# Patient Record
Sex: Female | Born: 1945 | Race: White | Hispanic: No | Smoking: Never smoker
Health system: Western US, Academic
[De-identification: ages and names within clinical notes are randomized; demographics above are authoritative.]

## PROBLEM LIST (undated history)

## (undated) ENCOUNTER — Ambulatory Visit: Admission: RE | Payer: Medicare Other | Admitting: General Surgery

## (undated) ENCOUNTER — Encounter (HOSPITAL_COMMUNITY): Admission: RE | Payer: Self-pay

## (undated) ENCOUNTER — Inpatient Hospital Stay (HOSPITAL_COMMUNITY): Payer: Self-pay | Admitting: Surgery

## (undated) ENCOUNTER — Inpatient Hospital Stay: Payer: Medicare Other | Admitting: Surgery

## (undated) ENCOUNTER — Encounter (HOSPITAL_COMMUNITY): Payer: Self-pay

## (undated) DIAGNOSIS — A310 Pulmonary mycobacterial infection: Secondary | ICD-10-CM

## (undated) DIAGNOSIS — F32A Depression, unspecified: Secondary | ICD-10-CM

## (undated) DIAGNOSIS — E119 Type 2 diabetes mellitus without complications: Secondary | ICD-10-CM

## (undated) DIAGNOSIS — J449 Chronic obstructive pulmonary disease, unspecified: Secondary | ICD-10-CM

## (undated) HISTORY — DX: Pulmonary mycobacterial infection (CMS-HCC): A31.0

## (undated) HISTORY — DX: Type 2 diabetes mellitus without complications: E11.9

## (undated) HISTORY — PX: COLOSTOMY: SHX63

## (undated) HISTORY — DX: Depression, unspecified: F32.A

## (undated) HISTORY — DX: Chronic obstructive pulmonary disease, unspecified (CMS-HCC): J44.9

## (undated) HISTORY — DX: Chronic obstructive pulmonary disease, unspecified: J44.9

## (undated) HISTORY — PX: LUNG LOBECTOMY: SHX167

## (undated) SURGERY — COMPLEX ILEOSTOMY, TAKE-DOWN
Anesthesia: General | Site: Abdomen

## (undated) SURGERY — COLOSTOMY TAKEDOWN WITH COLORECTAL ANASTOMOSIS
Anesthesia: General | Site: Abdomen

## (undated) MED ORDER — EPINEPHRINE 0.3 MG/0.3ML IJ SOAJ
0.3000 mg | INTRAMUSCULAR | Status: AC | PRN
Start: 2023-02-09 — End: 2023-02-03

## (undated) MED ORDER — ACETAMINOPHEN 325 MG PO TABS
650.00 mg | ORAL_TABLET | Freq: Once | ORAL | Status: AC
Start: 2021-09-24 — End: 2021-09-25

## (undated) MED ORDER — SODIUM CHLORIDE 0.9 % IV SOLN
INTRAVENOUS | Status: AC | PRN
Start: 2021-06-05 — End: ?

## (undated) MED ORDER — FAMOTIDINE (PF) 20 MG/2ML IV SOLN
20.0000 mg | Freq: Once | INTRAVENOUS | Status: AC | PRN
Start: 2021-04-10 — End: 2021-03-28

## (undated) MED ORDER — IMMUNE GLOBULIN (HUMAN) 20 GM/200ML IJ SOLN
0.5000 g/kg | Freq: Once | INTRAMUSCULAR | Status: AC
Start: 2022-11-12 — End: 2022-11-12

## (undated) MED ORDER — DIPHENHYDRAMINE HCL 25 MG OR TABS OR CAPS CUSTOM
25.0000 mg | ORAL_CAPSULE | Freq: Once | ORAL | Status: AC | PRN
Start: 2021-04-10 — End: ?

## (undated) MED ORDER — DIPHENHYDRAMINE HCL 50 MG/ML IJ SOLN
25.00 mg | Freq: Once | INTRAMUSCULAR | Status: AC | PRN
Start: 2022-12-02 — End: 2022-12-03

## (undated) MED ORDER — ACETAMINOPHEN 325 MG PO TABS
650.00 mg | ORAL_TABLET | Freq: Once | ORAL | Status: AC
Start: 2022-05-27 — End: 2022-05-06

## (undated) MED ORDER — FAMOTIDINE (PF) 20 MG/2ML IV SOLN
20.00 mg | Freq: Once | INTRAVENOUS | Status: AC | PRN
Start: 2022-06-25 — End: 2022-06-25

## (undated) MED ORDER — DEXTROSE 5 % IV SOLN
INTRAVENOUS | Status: AC
Start: 2021-07-30 — End: 2021-08-01

## (undated) MED ORDER — DIPHENHYDRAMINE HCL 25 MG OR TABS OR CAPS CUSTOM
25.00 mg | ORAL_CAPSULE | Freq: Once | ORAL | Status: AC | PRN
Start: 2022-03-11 — End: ?

## (undated) MED ORDER — CETIRIZINE HCL 10 MG OR TABS
10.00 mg | ORAL_TABLET | Freq: Once | ORAL | Status: AC
Start: 2022-01-15 — End: 2022-01-14

## (undated) MED ORDER — ALBUTEROL SULFATE (5 MG/ML) 0.5% IN NEBU
2.50 mg | INHALATION_SOLUTION | Freq: Once | RESPIRATORY_TRACT | Status: AC | PRN
Start: 2022-09-16 — End: 2022-09-17

## (undated) MED ORDER — FAMOTIDINE (PF) 20 MG/2ML IV SOLN
20.0000 mg | Freq: Once | INTRAVENOUS | Status: AC | PRN
Start: 2021-10-22 — End: 2021-10-22

## (undated) MED ORDER — DIPHENHYDRAMINE HCL 25 MG OR TABS OR CAPS CUSTOM
25.0000 mg | ORAL_CAPSULE | Freq: Once | ORAL | Status: AC | PRN
Start: 2021-02-13 — End: ?

## (undated) MED ORDER — DIPHENHYDRAMINE HCL 25 MG OR TABS OR CAPS CUSTOM
25.00 mg | ORAL_CAPSULE | Freq: Once | ORAL | Status: AC | PRN
Start: 2022-07-23 — End: ?

## (undated) MED ORDER — CETIRIZINE HCL 10 MG OR TABS
10.00 mg | ORAL_TABLET | Freq: Once | ORAL | Status: AC
Start: 2021-07-02 — End: 2021-07-03

## (undated) MED ORDER — SODIUM CHLORIDE 0.9 % IV SOLN
INTRAVENOUS | Status: AC | PRN
Start: 2021-12-17 — End: ?

## (undated) MED ORDER — IMMUNE GLOBULIN (HUMAN) 20 GM/200ML IJ SOLN
0.50 g/kg | Freq: Once | INTRAMUSCULAR | Status: AC
Start: 2022-04-29 — End: 2022-04-08

## (undated) MED ORDER — IMMUNE GLOBULIN (HUMAN) 20 GM/200ML IJ SOLN
0.50 g/kg | Freq: Once | INTRAMUSCULAR | Status: AC
Start: 2022-08-19 — End: 2022-08-20

## (undated) MED ORDER — IMMUNE GLOBULIN (HUMAN) 20 GM/200ML IJ SOLN
0.50 g/kg | Freq: Once | INTRAMUSCULAR | Status: AC
Start: 2022-03-11 — End: 2022-03-11

## (undated) MED ORDER — HEPARIN SODIUM (PORCINE) 5000 UNIT/ML IJ SOLN
5000.00 [IU] | Freq: Once | INTRAMUSCULAR | Status: AC
Start: 2021-06-26 — End: 2021-06-26

## (undated) MED ORDER — HYDROCORTISONE SOD SUCCINATE 100 MG IJ SOLR (CUSTOM)
100.00 mg | Freq: Once | INTRAMUSCULAR | Status: AC | PRN
Start: 2021-08-27 — End: 2021-08-28

## (undated) MED ORDER — ACETAMINOPHEN 325 MG PO TABS
650.00 mg | ORAL_TABLET | Freq: Once | ORAL | Status: AC
Start: 2022-01-15 — End: 2022-01-14

## (undated) MED ORDER — HYDROCORTISONE SOD SUCCINATE 100 MG IJ SOLR (CUSTOM)
100.0000 mg | Freq: Once | INTRAMUSCULAR | Status: AC | PRN
Start: 2021-10-22 — End: 2021-10-22

## (undated) MED ORDER — ALBUTEROL SULFATE (2.5 MG/3ML) 0.083% IN NEBU
2.5000 mg | INHALATION_SOLUTION | RESPIRATORY_TRACT | 11 refills | Status: AC | PRN
Start: 2020-07-24 — End: ?

## (undated) MED ORDER — CETIRIZINE HCL 10 MG OR TABS
10.00 mg | ORAL_TABLET | Freq: Once | ORAL | Status: AC
Start: 2022-08-19 — End: 2022-08-20

## (undated) MED ORDER — EPINEPHRINE 0.3 MG/0.3ML IJ SOAJ
0.30 mg | INTRAMUSCULAR | Status: AC | PRN
Start: 2022-09-16 — End: 2022-09-17

## (undated) MED ORDER — EPINEPHRINE 0.3 MG/0.3ML IJ SOAJ
0.30 mg | INTRAMUSCULAR | Status: AC | PRN
Start: 2021-07-30 — End: 2021-07-31

## (undated) MED ORDER — EPINEPHRINE 0.3 MG/0.3ML IJ SOAJ
0.30 mg | INTRAMUSCULAR | Status: AC | PRN
Start: 2021-10-22 — End: 2021-10-23

## (undated) MED ORDER — ALBUTEROL SULFATE (5 MG/ML) 0.5% IN NEBU
2.5000 mg | INHALATION_SOLUTION | Freq: Once | RESPIRATORY_TRACT | Status: AC | PRN
Start: 2021-09-24 — End: 2021-09-24

## (undated) MED ORDER — HYDROCORTISONE SOD SUCCINATE 100 MG IJ SOLR (CUSTOM)
100.0000 mg | Freq: Once | INTRAMUSCULAR | Status: AC | PRN
Start: 2022-11-12 — End: 2022-11-12

## (undated) MED ORDER — ALBUTEROL SULFATE (5 MG/ML) 0.5% IN NEBU
2.50 mg | INHALATION_SOLUTION | Freq: Once | RESPIRATORY_TRACT | Status: AC | PRN
Start: 2022-05-27 — End: 2022-05-06

## (undated) MED ORDER — SODIUM CHLORIDE 0.9 % IV SOLN
INTRAVENOUS | Status: AC | PRN
Start: 2022-01-15 — End: ?

## (undated) MED ORDER — DEXTROSE 5 % IV SOLN
INTRAVENOUS | Status: AC
Start: 2021-03-13 — End: 2021-02-28

## (undated) MED ORDER — FAMOTIDINE (PF) 20 MG/2ML IV SOLN
20.0000 mg | Freq: Once | INTRAVENOUS | Status: AC | PRN
Start: 2021-09-24 — End: 2021-09-24

## (undated) MED ORDER — CETIRIZINE HCL 10 MG OR TABS
10.0000 mg | ORAL_TABLET | Freq: Once | ORAL | Status: AC
Start: 2021-11-19 — End: 2021-11-19

## (undated) MED ORDER — HYDROCORTISONE SOD SUCCINATE 100 MG IJ SOLR (CUSTOM)
100.00 mg | Freq: Once | INTRAMUSCULAR | Status: AC | PRN
Start: 2021-11-19 — End: 2021-11-20

## (undated) MED ORDER — SODIUM CHLORIDE 0.9 % IV SOLN
INTRAVENOUS | Status: AC | PRN
Start: 2022-12-10 — End: ?

## (undated) MED ORDER — CETIRIZINE HCL 10 MG OR TABS
10.00 mg | ORAL_TABLET | Freq: Once | ORAL | Status: AC
Start: 2022-11-04 — End: 2022-11-05

## (undated) MED ORDER — CETIRIZINE HCL 10 MG OR TABS
10.0000 mg | ORAL_TABLET | Freq: Once | ORAL | Status: AC
Start: 2021-09-24 — End: 2021-09-24

## (undated) MED ORDER — SODIUM CHLORIDE 0.9 % IV SOLN
INTRAVENOUS | Status: AC | PRN
Start: 2021-09-24 — End: ?

## (undated) MED ORDER — HYDROCORTISONE SOD SUCCINATE 100 MG IJ SOLR (CUSTOM)
100.00 mg | Freq: Once | INTRAMUSCULAR | Status: AC | PRN
Start: 2022-11-04 — End: 2022-11-05

## (undated) MED ORDER — SODIUM CHLORIDE 0.9 % IV SOLN
INTRAVENOUS | Status: AC | PRN
Start: 2022-07-23 — End: ?

## (undated) MED ORDER — DEXTROSE 5 % IV SOLN
INTRAVENOUS | Status: AC
Start: 2022-12-02 — End: 2022-12-04

## (undated) MED ORDER — DIPHENHYDRAMINE HCL 50 MG/ML IJ SOLN
25.00 mg | Freq: Once | INTRAMUSCULAR | Status: AC | PRN
Start: 2022-09-16 — End: 2022-09-17

## (undated) MED ORDER — DEXTROSE 5 % IV SOLN
INTRAVENOUS | Status: AC
Start: 2021-05-08 — End: 2021-04-25

## (undated) MED ORDER — EPINEPHRINE 0.3 MG/0.3ML IJ SOAJ
0.30 mg | INTRAMUSCULAR | Status: AC | PRN
Start: 2022-04-29 — End: 2022-04-08

## (undated) MED ORDER — SODIUM CHLORIDE 0.9 % IV SOLN
INTRAVENOUS | Status: AC | PRN
Start: 2022-11-12 — End: ?

## (undated) MED ORDER — IMMUNE GLOBULIN (HUMAN) 20 GM/200ML IJ SOLN
0.5000 g/kg | Freq: Once | INTRAMUSCULAR | Status: AC
Start: 2021-05-08 — End: 2021-04-25

## (undated) MED ORDER — CETIRIZINE HCL 10 MG OR TABS
10.00 mg | ORAL_TABLET | Freq: Once | ORAL | Status: AC
Start: 2022-04-29 — End: 2022-04-08

## (undated) MED ORDER — GABAPENTIN 100 MG OR CAPS
100.00 mg | ORAL_CAPSULE | Freq: Once | ORAL | Status: AC
Start: 2021-06-26 — End: 2021-06-26

## (undated) MED ORDER — DIPHENHYDRAMINE HCL 25 MG OR TABS OR CAPS CUSTOM
25.00 mg | ORAL_CAPSULE | Freq: Once | ORAL | Status: AC | PRN
Start: 2022-01-15 — End: ?

## (undated) MED ORDER — EPINEPHRINE 0.3 MG/0.3ML IJ SOAJ
0.3000 mg | INTRAMUSCULAR | Status: AC | PRN
Start: 2021-01-16 — End: 2021-01-03

## (undated) MED ORDER — SODIUM CHLORIDE 0.9 % IV SOLN
INTRAVENOUS | Status: AC | PRN
Start: 2021-10-22 — End: ?

## (undated) MED ORDER — ALBUTEROL SULFATE (5 MG/ML) 0.5% IN NEBU
2.5000 mg | INHALATION_SOLUTION | Freq: Once | RESPIRATORY_TRACT | Status: AC | PRN
Start: 2021-10-22 — End: 2021-10-22

## (undated) MED ORDER — EPINEPHRINE 0.3 MG/0.3ML IJ SOAJ
0.3000 mg | INTRAMUSCULAR | Status: AC | PRN
Start: 2021-05-08 — End: 2021-04-25

## (undated) MED ORDER — ACETAMINOPHEN 325 MG PO TABS
650.00 mg | ORAL_TABLET | Freq: Once | ORAL | Status: AC
Start: 2022-03-11 — End: 2022-03-11

## (undated) MED ORDER — HYDROCORTISONE SOD SUCCINATE 100 MG IJ SOLR (CUSTOM)
100.00 mg | Freq: Once | INTRAMUSCULAR | Status: AC | PRN
Start: 2021-07-02 — End: 2021-07-03

## (undated) MED ORDER — ALBUTEROL SULFATE (5 MG/ML) 0.5% IN NEBU
2.5000 mg | INHALATION_SOLUTION | Freq: Once | RESPIRATORY_TRACT | Status: AC | PRN
Start: 2021-05-08 — End: 2021-04-25

## (undated) MED ORDER — HYDROCORTISONE SOD SUCCINATE 100 MG IJ SOLR (CUSTOM)
100.0000 mg | Freq: Once | INTRAMUSCULAR | Status: AC | PRN
Start: 2021-03-13 — End: 2021-02-28

## (undated) MED ORDER — ALBUTEROL SULFATE (5 MG/ML) 0.5% IN NEBU
2.5000 mg | INHALATION_SOLUTION | Freq: Once | RESPIRATORY_TRACT | Status: AC | PRN
Start: 2022-11-13 — End: 2022-11-12

## (undated) MED ORDER — DIPHENHYDRAMINE HCL 25 MG OR TABS OR CAPS CUSTOM
25.00 mg | ORAL_CAPSULE | Freq: Once | ORAL | Status: AC | PRN
Start: 2021-09-24 — End: ?

## (undated) MED ORDER — ALBUTEROL SULFATE (5 MG/ML) 0.5% IN NEBU
2.5000 mg | INHALATION_SOLUTION | Freq: Once | RESPIRATORY_TRACT | Status: AC | PRN
Start: 2021-06-05 — End: 2021-05-23

## (undated) MED ORDER — DIPHENHYDRAMINE HCL 25 MG OR TABS OR CAPS CUSTOM
25.0000 mg | ORAL_CAPSULE | Freq: Once | ORAL | Status: AC | PRN
Start: 2022-12-10 — End: ?

## (undated) MED ORDER — SODIUM CHLORIDE 0.9 % IV SOLN
INTRAVENOUS | Status: AC | PRN
Start: 2021-04-10 — End: ?

## (undated) MED ORDER — EPINEPHRINE 0.3 MG/0.3ML IJ SOAJ
0.3000 mg | INTRAMUSCULAR | Status: AC | PRN
Start: 2021-06-05 — End: 2021-05-23

## (undated) MED ORDER — FAMOTIDINE (PF) 20 MG/2ML IV SOLN
20.00 mg | Freq: Once | INTRAVENOUS | Status: AC | PRN
Start: 2022-12-02 — End: 2022-12-03

## (undated) MED ORDER — HYDROCORTISONE SOD SUCCINATE 100 MG IJ SOLR (CUSTOM)
100.0000 mg | Freq: Once | INTRAMUSCULAR | Status: AC | PRN
Start: 2023-02-09 — End: 2023-02-03

## (undated) MED ORDER — CETIRIZINE HCL 10 MG OR TABS
10.0000 mg | ORAL_TABLET | Freq: Once | ORAL | Status: AC
Start: 2021-04-10 — End: 2021-03-28

## (undated) MED ORDER — FAMOTIDINE (PF) 20 MG/2ML IV SOLN
20.00 mg | Freq: Once | INTRAVENOUS | Status: AC | PRN
Start: 2021-07-30 — End: 2021-07-31

## (undated) MED ORDER — IMMUNE GLOBULIN (HUMAN) 20 GM/200ML IJ SOLN
0.50 g/kg | Freq: Once | INTRAMUSCULAR | Status: AC
Start: 2021-08-27 — End: 2021-08-28

## (undated) MED ORDER — EPINEPHRINE 0.3 MG/0.3ML IJ SOAJ
0.30 mg | INTRAMUSCULAR | Status: AC | PRN
Start: 2021-07-02 — End: 2021-07-03

## (undated) MED ORDER — IMMUNE GLOBULIN (HUMAN) 20 GM/200ML IJ SOLN
0.5000 g/kg | Freq: Once | INTRAMUSCULAR | Status: AC
Start: 2023-02-09 — End: 2023-02-03

## (undated) MED ORDER — IMMUNE GLOBULIN (HUMAN) 20 GM/200ML IJ SOLN
0.5000 g/kg | Freq: Once | INTRAMUSCULAR | Status: AC
Start: 2021-09-24 — End: 2021-09-24

## (undated) MED ORDER — SODIUM CHLORIDE 0.9 % IV SOLN
INTRAVENOUS | Status: AC | PRN
Start: 2021-01-16 — End: ?

## (undated) MED ORDER — IMMUNE GLOBULIN (HUMAN) 20 GM/200ML IJ SOLN
0.50 g/kg | Freq: Once | INTRAMUSCULAR | Status: AC
Start: 2022-05-27 — End: 2022-05-06

## (undated) MED ORDER — DIPHENHYDRAMINE HCL 25 MG OR TABS OR CAPS CUSTOM
25.00 mg | ORAL_CAPSULE | Freq: Once | ORAL | Status: AC | PRN
Start: 2021-07-30 — End: ?

## (undated) MED ORDER — EPINEPHRINE 0.3 MG/0.3ML IJ SOAJ
0.30 mg | INTRAMUSCULAR | Status: AC | PRN
Start: 2022-01-15 — End: 2022-01-14

## (undated) MED ORDER — SODIUM CHLORIDE 0.9 % IV SOLN
INTRAVENOUS | Status: AC | PRN
Start: 2021-02-13 — End: ?

## (undated) MED ORDER — DIPHENHYDRAMINE HCL 25 MG OR TABS OR CAPS CUSTOM
25.0000 mg | ORAL_CAPSULE | Freq: Once | ORAL | Status: AC | PRN
Start: 2021-03-13 — End: ?

## (undated) MED ORDER — LACTATED RINGERS IV SOLN
INTRAVENOUS | Status: AC
Start: 2021-06-26 — End: ?

## (undated) MED ORDER — EPINEPHRINE 0.3 MG/0.3ML IJ SOAJ
0.30 mg | INTRAMUSCULAR | Status: AC | PRN
Start: 2022-12-02 — End: 2022-12-03

## (undated) MED ORDER — HYDROCORTISONE SOD SUCCINATE 100 MG IJ SOLR (CUSTOM)
100.00 mg | Freq: Once | INTRAMUSCULAR | Status: AC | PRN
Start: 2021-09-24 — End: 2021-09-25

## (undated) MED ORDER — FAMOTIDINE (PF) 20 MG/2ML IV SOLN
20.00 mg | Freq: Once | INTRAVENOUS | Status: AC | PRN
Start: 2021-09-24 — End: 2021-09-25

## (undated) MED ORDER — DIPHENHYDRAMINE HCL 50 MG/ML IJ SOLN
25.00 mg | Freq: Once | INTRAMUSCULAR | Status: AC | PRN
Start: 2022-08-19 — End: 2022-08-20

## (undated) MED ORDER — HYDROCORTISONE SOD SUCCINATE 100 MG IJ SOLR (CUSTOM)
100.00 mg | Freq: Once | INTRAMUSCULAR | Status: AC | PRN
Start: 2021-12-17 — End: 2021-12-17

## (undated) MED ORDER — ACETAMINOPHEN 325 MG PO TABS
975.00 mg | ORAL_TABLET | Freq: Once | ORAL | Status: AC
Start: 2021-06-26 — End: 2021-06-26

## (undated) MED ORDER — HYDROCORTISONE SOD SUCCINATE 100 MG IJ SOLR (CUSTOM)
100.0000 mg | Freq: Once | INTRAMUSCULAR | Status: AC | PRN
Start: 2021-02-13 — End: 2021-01-31

## (undated) MED ORDER — ALBUTEROL SULFATE (5 MG/ML) 0.5% IN NEBU
2.5000 mg | INHALATION_SOLUTION | Freq: Once | RESPIRATORY_TRACT | Status: AC | PRN
Start: 2021-03-13 — End: 2021-02-28

## (undated) MED ORDER — CETIRIZINE HCL 10 MG OR TABS
10.00 mg | ORAL_TABLET | Freq: Once | ORAL | Status: AC
Start: 2021-09-24 — End: 2021-09-25

## (undated) MED ORDER — DEXTROSE 5 % IV SOLN
INTRAVENOUS | Status: AC
Start: 2021-11-19 — End: 2021-11-20

## (undated) MED ORDER — IMMUNE GLOBULIN (HUMAN) 20 GM/200ML IJ SOLN
0.50 g/kg | Freq: Once | INTRAMUSCULAR | Status: AC
Start: 2022-01-15 — End: 2022-01-14

## (undated) MED ORDER — DIPHENHYDRAMINE HCL 25 MG OR TABS OR CAPS CUSTOM
25.00 mg | ORAL_CAPSULE | Freq: Once | ORAL | Status: AC | PRN
Start: 2021-10-22 — End: ?

## (undated) MED ORDER — FAMOTIDINE (PF) 20 MG/2ML IV SOLN
20.00 mg | Freq: Once | INTRAVENOUS | Status: AC | PRN
Start: 2021-07-02 — End: 2021-07-03

## (undated) MED ORDER — CETIRIZINE HCL 10 MG OR TABS
10.00 mg | ORAL_TABLET | Freq: Once | ORAL | Status: AC
Start: 2022-05-27 — End: 2022-05-06

## (undated) MED ORDER — IMMUNE GLOBULIN (HUMAN) 20 GM/200ML IJ SOLN
0.50 g/kg | Freq: Once | INTRAMUSCULAR | Status: AC
Start: 2021-11-19 — End: 2021-11-20

## (undated) MED ORDER — RAMIPRIL 2.5 MG OR CAPS
2.50 mg | ORAL_CAPSULE | Freq: Every day | ORAL | Status: AC
Start: 2022-08-09 — End: ?

## (undated) MED ORDER — SODIUM CHLORIDE 0.9 % IV SOLN
INTRAVENOUS | Status: AC | PRN
Start: 2022-04-29 — End: ?

## (undated) MED ORDER — VENTOLIN HFA 108 (90 BASE) MCG/ACT IN AERS
INHALATION_SPRAY | RESPIRATORY_TRACT | 3 refills | Status: AC
Start: 2021-04-18 — End: ?

## (undated) MED ORDER — FAMOTIDINE (PF) 20 MG/2ML IV SOLN
20.00 mg | Freq: Once | INTRAVENOUS | Status: AC | PRN
Start: 2021-11-19 — End: 2021-11-20

## (undated) MED ORDER — DIPHENHYDRAMINE HCL 25 MG OR TABS OR CAPS CUSTOM
25.0000 mg | ORAL_CAPSULE | Freq: Once | ORAL | Status: AC | PRN
Start: 2021-11-19 — End: ?

## (undated) MED ORDER — HYDROCORTISONE SOD SUCCINATE 100 MG IJ SOLR (CUSTOM)
100.00 mg | Freq: Once | INTRAMUSCULAR | Status: AC | PRN
Start: 2022-01-15 — End: 2022-01-14

## (undated) MED ORDER — DEXTROSE 5 % IV SOLN
INTRAVENOUS | Status: AC
Start: 2022-03-11 — End: 2022-03-12

## (undated) MED ORDER — CETIRIZINE HCL 10 MG OR TABS
10.0000 mg | ORAL_TABLET | Freq: Once | ORAL | Status: AC
Start: 2022-11-13 — End: 2022-11-12

## (undated) MED ORDER — DEXTROSE 5 % IV SOLN
INTRAVENOUS | Status: AC
Start: 2021-12-17 — End: 2021-12-18

## (undated) MED ORDER — ALBUTEROL SULFATE (5 MG/ML) 0.5% IN NEBU
2.50 mg | INHALATION_SOLUTION | Freq: Once | RESPIRATORY_TRACT | Status: AC | PRN
Start: 2021-08-27 — End: 2021-08-28

## (undated) MED ORDER — SODIUM CHLORIDE 0.9 % IV SOLN
INTRAVENOUS | Status: AC | PRN
Start: 2021-11-19 — End: ?

## (undated) MED ORDER — DEXTROSE 5 % IV SOLN
INTRAVENOUS | Status: AC
Start: 2021-09-24 — End: 2021-09-24

## (undated) MED ORDER — ACETAMINOPHEN 325 MG PO TABS
650.00 mg | ORAL_TABLET | Freq: Once | ORAL | Status: AC
Start: 2021-10-22 — End: 2021-10-23

## (undated) MED ORDER — FAMOTIDINE (PF) 20 MG/2ML IV SOLN
20.00 mg | Freq: Once | INTRAVENOUS | Status: AC | PRN
Start: 2021-08-27 — End: 2021-08-28

## (undated) MED ORDER — EPINEPHRINE 0.3 MG/0.3ML IJ SOAJ
0.30 mg | INTRAMUSCULAR | Status: AC | PRN
Start: 2021-08-27 — End: 2021-08-28

## (undated) MED ORDER — DIPHENHYDRAMINE HCL 50 MG/ML IJ SOLN
25.0000 mg | Freq: Once | INTRAMUSCULAR | Status: AC | PRN
Start: 2021-04-10 — End: 2021-03-28

## (undated) MED ORDER — DIPHENHYDRAMINE HCL 25 MG OR TABS OR CAPS CUSTOM
25.00 mg | ORAL_CAPSULE | Freq: Once | ORAL | Status: AC | PRN
Start: 2022-04-29 — End: ?

## (undated) MED ORDER — DEXTROSE 5 % IV SOLN
INTRAVENOUS | Status: AC
Start: 2022-12-10 — End: 2022-12-11

## (undated) MED ORDER — EPINEPHRINE 0.3 MG/0.3ML IJ SOAJ
0.30 mg | INTRAMUSCULAR | Status: AC | PRN
Start: 2022-06-25 — End: 2022-06-25

## (undated) MED ORDER — DIPHENHYDRAMINE HCL 50 MG/ML IJ SOLN
25.0000 mg | Freq: Once | INTRAMUSCULAR | Status: AC | PRN
Start: 2023-02-09 — End: 2023-02-03

## (undated) MED ORDER — FAMOTIDINE (PF) 20 MG/2ML IV SOLN
20.00 mg | Freq: Once | INTRAVENOUS | Status: AC | PRN
Start: 2022-02-11 — End: 2022-02-11

## (undated) MED ORDER — FAMOTIDINE (PF) 20 MG/2ML IV SOLN
20.00 mg | Freq: Once | INTRAVENOUS | Status: AC | PRN
Start: 2021-10-22 — End: 2021-10-23

## (undated) MED ORDER — DIPHENHYDRAMINE HCL 50 MG/ML IJ SOLN
25.0000 mg | Freq: Once | INTRAMUSCULAR | Status: AC | PRN
Start: 2022-11-13 — End: 2022-11-12

## (undated) MED ORDER — EPINEPHRINE 0.3 MG/0.3ML IJ SOAJ
0.3000 mg | INTRAMUSCULAR | Status: AC | PRN
Start: 2022-12-10 — End: 2022-12-10

## (undated) MED ORDER — ACETAMINOPHEN 325 MG PO TABS
650.00 mg | ORAL_TABLET | Freq: Once | ORAL | Status: AC
Start: 2021-12-17 — End: 2021-12-17

## (undated) MED ORDER — ACETAMINOPHEN 325 MG PO TABS
650.0000 mg | ORAL_TABLET | Freq: Once | ORAL | Status: AC
Start: 2021-01-16 — End: 2021-01-03

## (undated) MED ORDER — CETIRIZINE HCL 10 MG OR TABS
10.0000 mg | ORAL_TABLET | Freq: Once | ORAL | Status: AC
Start: 2021-05-08 — End: 2021-04-25

## (undated) MED ORDER — ACETAMINOPHEN 325 MG PO TABS
650.00 mg | ORAL_TABLET | Freq: Once | ORAL | Status: AC
Start: 2022-08-19 — End: 2022-08-20

## (undated) MED ORDER — FAMOTIDINE (PF) 20 MG/2ML IV SOLN
20.0000 mg | Freq: Once | INTRAVENOUS | Status: AC | PRN
Start: 2021-06-05 — End: 2021-05-23

## (undated) MED ORDER — DEXTROSE 5 % IV SOLN
INTRAVENOUS | Status: AC
Start: 2023-03-09 — End: 2023-03-04

## (undated) MED ORDER — ALBUTEROL SULFATE (5 MG/ML) 0.5% IN NEBU
2.50 mg | INHALATION_SOLUTION | Freq: Once | RESPIRATORY_TRACT | Status: AC | PRN
Start: 2022-06-25 — End: 2022-06-25

## (undated) MED ORDER — ACETAMINOPHEN 325 MG PO TABS
650.0000 mg | ORAL_TABLET | Freq: Once | ORAL | Status: AC
Start: 2021-04-10 — End: 2021-03-28

## (undated) MED ORDER — IMMUNE GLOBULIN (HUMAN) 20 GM/200ML IJ SOLN
0.5000 g/kg | Freq: Once | INTRAMUSCULAR | Status: AC
Start: 2021-10-22 — End: 2021-10-22

## (undated) MED ORDER — ACETAMINOPHEN 325 MG PO TABS
650.00 mg | ORAL_TABLET | Freq: Once | ORAL | Status: AC
Start: 2022-09-16 — End: 2022-09-17

## (undated) MED ORDER — EPINEPHRINE 0.3 MG/0.3ML IJ SOAJ
0.30 mg | INTRAMUSCULAR | Status: AC | PRN
Start: 2022-07-23 — End: 2022-07-24

## (undated) MED ORDER — HYDROCORTISONE SOD SUCCINATE 100 MG IJ SOLR (CUSTOM)
100.00 mg | Freq: Once | INTRAMUSCULAR | Status: AC | PRN
Start: 2022-12-02 — End: 2022-12-03

## (undated) MED ORDER — DIPHENHYDRAMINE HCL 50 MG/ML IJ SOLN
25.00 mg | Freq: Once | INTRAMUSCULAR | Status: AC | PRN
Start: 2022-07-23 — End: 2022-07-24

## (undated) MED ORDER — SODIUM CHLORIDE 0.9 % IV SOLN
INTRAVENOUS | Status: AC | PRN
Start: 2021-05-08 — End: ?

## (undated) MED ORDER — SODIUM CHLORIDE 0.9 % IV SOLN
INTRAVENOUS | Status: AC | PRN
Start: 2021-08-27 — End: ?

## (undated) MED ORDER — HYDROCORTISONE SOD SUCCINATE 100 MG IJ SOLR (CUSTOM)
100.0000 mg | Freq: Once | INTRAMUSCULAR | Status: AC | PRN
Start: 2021-04-10 — End: 2021-03-28

## (undated) MED ORDER — HYDROCORTISONE SOD SUCCINATE 100 MG IJ SOLR (CUSTOM)
100.00 mg | Freq: Once | INTRAMUSCULAR | Status: AC | PRN
Start: 2022-02-11 — End: 2022-02-11

## (undated) MED ORDER — DIPHENHYDRAMINE HCL 25 MG OR TABS OR CAPS CUSTOM
25.0000 mg | ORAL_CAPSULE | Freq: Once | ORAL | Status: AC | PRN
Start: 2022-11-13 — End: ?

## (undated) MED ORDER — FAMOTIDINE (PF) 20 MG/2ML IV SOLN
20.0000 mg | Freq: Once | INTRAVENOUS | Status: AC | PRN
Start: 2022-12-10 — End: 2022-12-10

## (undated) MED ORDER — ALBUTEROL SULFATE (5 MG/ML) 0.5% IN NEBU
2.50 mg | INHALATION_SOLUTION | Freq: Once | RESPIRATORY_TRACT | Status: AC | PRN
Start: 2022-03-11 — End: 2022-03-11

## (undated) MED ORDER — DIPHENHYDRAMINE HCL 50 MG/ML IJ SOLN
25.0000 mg | Freq: Once | INTRAMUSCULAR | Status: AC | PRN
Start: 2021-06-05 — End: 2021-05-23

## (undated) MED ORDER — CLOFAZIMINE IRB 21-0063 50 MG CAPSULE
0 refills | Status: AC
Start: 2021-05-07 — End: ?

## (undated) MED ORDER — HYDROCORTISONE SOD SUCCINATE 100 MG IJ SOLR (CUSTOM)
100.00 mg | Freq: Once | INTRAMUSCULAR | Status: AC | PRN
Start: 2022-04-29 — End: 2022-04-08

## (undated) MED ORDER — DEXTROSE 5 % IV SOLN
INTRAVENOUS | Status: AC
Start: 2021-04-10 — End: 2021-03-28

## (undated) MED ORDER — FAMOTIDINE (PF) 20 MG/2ML IV SOLN
20.0000 mg | Freq: Once | INTRAVENOUS | Status: AC | PRN
Start: 2021-02-13 — End: 2021-01-31

## (undated) MED ORDER — EPINEPHRINE 0.3 MG/0.3ML IJ SOAJ
0.3000 mg | INTRAMUSCULAR | Status: AC | PRN
Start: 2021-03-13 — End: 2021-02-28

## (undated) MED ORDER — CETIRIZINE HCL 10 MG OR TABS
10.0000 mg | ORAL_TABLET | Freq: Once | ORAL | Status: AC
Start: 2021-10-22 — End: 2021-10-22

## (undated) MED ORDER — CETIRIZINE HCL 10 MG OR TABS
10.0000 mg | ORAL_TABLET | Freq: Once | ORAL | Status: AC
Start: 2023-01-12 — End: 2023-01-06

## (undated) MED ORDER — DIPHENHYDRAMINE HCL 50 MG/ML IJ SOLN
25.00 mg | Freq: Once | INTRAMUSCULAR | Status: AC | PRN
Start: 2021-09-24 — End: 2021-09-25

## (undated) MED ORDER — SODIUM CHLORIDE 3 % IN NEBU
4.0000 mL | INHALATION_SOLUTION | Freq: Two times a day (BID) | RESPIRATORY_TRACT | 11 refills | Status: AC
Start: 2020-07-24 — End: ?

## (undated) MED ORDER — ALBUTEROL SULFATE (5 MG/ML) 0.5% IN NEBU
2.50 mg | INHALATION_SOLUTION | Freq: Once | RESPIRATORY_TRACT | Status: AC | PRN
Start: 2021-10-22 — End: 2021-10-23

## (undated) MED ORDER — DIPHENHYDRAMINE HCL 25 MG OR TABS OR CAPS CUSTOM
25.00 mg | ORAL_CAPSULE | Freq: Once | ORAL | Status: AC | PRN
Start: 2022-08-19 — End: ?

## (undated) MED ORDER — CETIRIZINE HCL 10 MG OR TABS
10.0000 mg | ORAL_TABLET | Freq: Once | ORAL | Status: AC
Start: 2021-01-16 — End: 2021-01-03

## (undated) MED ORDER — SODIUM CHLORIDE 0.9 % IV SOLN
INTRAVENOUS | Status: AC | PRN
Start: 2022-05-27 — End: ?

## (undated) MED ORDER — IMMUNE GLOBULIN (HUMAN) 20 GM/200ML IJ SOLN
0.50 g/kg | Freq: Once | INTRAMUSCULAR | Status: AC
Start: 2021-10-22 — End: 2021-10-23

## (undated) MED ORDER — EPINEPHRINE 0.3 MG/0.3ML IJ SOAJ
0.3000 mg | INTRAMUSCULAR | Status: AC | PRN
Start: 2021-02-13 — End: 2021-01-31

## (undated) MED ORDER — CETIRIZINE HCL 10 MG OR TABS
10.00 mg | ORAL_TABLET | Freq: Once | ORAL | Status: AC
Start: 2022-07-23 — End: 2022-07-24

## (undated) MED ORDER — BUPIVACAINE LIPOSOME 1.3 % IJ SUSP
INTRAMUSCULAR | Status: AC | PRN
Start: 2021-03-16 — End: ?

## (undated) MED ORDER — IMMUNE GLOBULIN (HUMAN) 20 GM/200ML IJ SOLN
0.50 g/kg | Freq: Once | INTRAMUSCULAR | Status: AC
Start: 2022-06-25 — End: 2022-06-25

## (undated) MED ORDER — SCOPOLAMINE 1 MG/3DAYS TD PT72
1.00 | MEDICATED_PATCH | Freq: Once | TRANSDERMAL | Status: AC
Start: 2021-06-26 — End: 2021-06-26

## (undated) MED ORDER — CETIRIZINE HCL 10 MG OR TABS
10.00 mg | ORAL_TABLET | Freq: Once | ORAL | Status: AC
Start: 2021-12-17 — End: 2021-12-17

## (undated) MED ORDER — CETIRIZINE HCL 10 MG OR TABS
10.0000 mg | ORAL_TABLET | Freq: Once | ORAL | Status: AC
Start: 2021-02-13 — End: 2021-01-31

## (undated) MED ORDER — HYDROCORTISONE SOD SUCCINATE 100 MG IJ SOLR (CUSTOM)
100.00 mg | Freq: Once | INTRAMUSCULAR | Status: AC | PRN
Start: 2022-07-23 — End: 2022-07-24

## (undated) MED ORDER — HYDROCORTISONE SOD SUCCINATE 100 MG IJ SOLR (CUSTOM)
100.0000 mg | Freq: Once | INTRAMUSCULAR | Status: AC | PRN
Start: 2021-01-16 — End: 2021-01-03

## (undated) MED ORDER — DIPHENHYDRAMINE HCL 50 MG/ML IJ SOLN
25.00 mg | Freq: Once | INTRAMUSCULAR | Status: AC | PRN
Start: 2021-07-02 — End: 2021-07-03

## (undated) MED ORDER — FAMOTIDINE (PF) 20 MG/2ML IV SOLN
20.00 mg | Freq: Once | INTRAVENOUS | Status: AC | PRN
Start: 2022-07-23 — End: 2022-07-24

## (undated) MED ORDER — HYDROCORTISONE SOD SUCCINATE 100 MG IJ SOLR (CUSTOM)
100.00 mg | Freq: Once | INTRAMUSCULAR | Status: AC | PRN
Start: 2021-07-30 — End: 2021-07-31

## (undated) MED ORDER — EPINEPHRINE 0.3 MG/0.3ML IJ SOAJ
0.3000 mg | INTRAMUSCULAR | Status: AC | PRN
Start: 2021-11-19 — End: 2021-11-19

## (undated) MED ORDER — IMMUNE GLOBULIN (HUMAN) 20 GM/200ML IJ SOLN
0.50 g/kg | Freq: Once | INTRAMUSCULAR | Status: AC
Start: 2022-02-11 — End: 2022-02-11

## (undated) MED ORDER — DIPHENHYDRAMINE HCL 50 MG/ML IJ SOLN
25.00 mg | Freq: Once | INTRAMUSCULAR | Status: AC | PRN
Start: 2022-02-11 — End: 2022-02-11

## (undated) MED ORDER — DIPHENHYDRAMINE HCL 25 MG OR TABS OR CAPS CUSTOM
25.00 mg | ORAL_CAPSULE | Freq: Once | ORAL | Status: AC | PRN
Start: 2021-08-27 — End: ?

## (undated) MED ORDER — HYDROCORTISONE SOD SUCCINATE 100 MG IJ SOLR (CUSTOM)
100.0000 mg | Freq: Once | INTRAMUSCULAR | Status: AC | PRN
Start: 2023-03-04 — End: 2023-03-03

## (undated) MED ORDER — EPINEPHRINE 0.3 MG/0.3ML IJ SOAJ
0.3000 mg | INTRAMUSCULAR | Status: AC | PRN
Start: 2021-04-10 — End: 2021-03-28

## (undated) MED ORDER — IMMUNE GLOBULIN (HUMAN) 20 GM/200ML IJ SOLN
0.5000 g/kg | Freq: Once | INTRAMUSCULAR | Status: AC
Start: 2021-11-19 — End: 2021-11-19

## (undated) MED ORDER — EPINEPHRINE 0.3 MG/0.3ML IJ SOAJ
0.30 mg | INTRAMUSCULAR | Status: AC | PRN
Start: 2022-05-27 — End: 2022-05-06

## (undated) MED ORDER — CETIRIZINE HCL 10 MG OR TABS
10.0000 mg | ORAL_TABLET | Freq: Once | ORAL | Status: AC
Start: 2021-06-05 — End: 2021-05-23

## (undated) MED ORDER — DEXTROSE 5 % IV SOLN
INTRAVENOUS | Status: AC
Start: 2021-08-27 — End: 2021-08-29

## (undated) MED ORDER — DIPHENHYDRAMINE HCL 50 MG/ML IJ SOLN
25.0000 mg | Freq: Once | INTRAMUSCULAR | Status: AC | PRN
Start: 2022-12-11 — End: 2022-12-10

## (undated) MED ORDER — EPINEPHRINE 0.3 MG/0.3ML IJ SOAJ
0.30 mg | INTRAMUSCULAR | Status: AC | PRN
Start: 2022-11-04 — End: 2022-11-05

## (undated) MED ORDER — IMMUNE GLOBULIN (HUMAN) 20 GM/200ML IJ SOLN
0.5000 g/kg | Freq: Once | INTRAMUSCULAR | Status: AC
Start: 2021-04-10 — End: 2021-03-28

## (undated) MED ORDER — HYDROCORTISONE SOD SUCCINATE 100 MG IJ SOLR (CUSTOM)
100.00 mg | Freq: Once | INTRAMUSCULAR | Status: AC | PRN
Start: 2022-05-27 — End: 2022-05-06

## (undated) MED ORDER — DIPHENHYDRAMINE HCL 25 MG OR TABS OR CAPS CUSTOM
25.00 mg | ORAL_CAPSULE | Freq: Once | ORAL | Status: AC | PRN
Start: 2021-11-19 — End: ?

## (undated) MED ORDER — HYDROCORTISONE SOD SUCCINATE 100 MG IJ SOLR (CUSTOM)
100.0000 mg | Freq: Once | INTRAMUSCULAR | Status: AC | PRN
Start: 2023-01-12 — End: 2023-01-06

## (undated) MED ORDER — DEXTROSE 5 % IV SOLN
INTRAVENOUS | Status: AC
Start: 2021-11-19 — End: 2021-11-19

## (undated) MED ORDER — SODIUM CHLORIDE 0.9 % IV SOLN
INTRAVENOUS | Status: AC | PRN
Start: 2022-03-11 — End: ?

## (undated) MED ORDER — ACETAMINOPHEN 325 MG PO TABS
650.00 mg | ORAL_TABLET | Freq: Once | ORAL | Status: AC
Start: 2021-11-19 — End: 2021-11-20

## (undated) MED ORDER — ALBUTEROL SULFATE (5 MG/ML) 0.5% IN NEBU
2.5000 mg | INHALATION_SOLUTION | Freq: Once | RESPIRATORY_TRACT | Status: AC | PRN
Start: 2022-12-11 — End: 2022-12-10

## (undated) MED ORDER — DEXTROSE 5 % IV SOLN
INTRAVENOUS | Status: AC
Start: 2021-07-02 — End: 2021-07-04

## (undated) MED ORDER — HYDROCORTISONE SOD SUCCINATE 100 MG IJ SOLR (CUSTOM)
100.0000 mg | Freq: Once | INTRAMUSCULAR | Status: AC | PRN
Start: 2021-06-05 — End: 2021-05-23

## (undated) MED ORDER — FAMOTIDINE (PF) 20 MG/2ML IV SOLN
20.00 mg | Freq: Once | INTRAVENOUS | Status: AC | PRN
Start: 2022-04-29 — End: 2022-04-08

## (undated) MED ORDER — SODIUM CHLORIDE 0.9 % IV SOLN
INTRAVENOUS | Status: AC | PRN
Start: 2022-11-04 — End: ?

## (undated) MED ORDER — EPINEPHRINE 0.3 MG/0.3ML IJ SOAJ
0.30 mg | INTRAMUSCULAR | Status: AC | PRN
Start: 2021-12-17 — End: 2021-12-17

## (undated) MED ORDER — ALBUTEROL SULFATE (5 MG/ML) 0.5% IN NEBU
2.5000 mg | INHALATION_SOLUTION | Freq: Once | RESPIRATORY_TRACT | Status: AC | PRN
Start: 2023-03-04 — End: 2023-03-03

## (undated) MED ORDER — ACETAMINOPHEN 325 MG PO TABS
650.0000 mg | ORAL_TABLET | Freq: Once | ORAL | Status: AC
Start: 2021-05-08 — End: 2021-04-25

## (undated) MED ORDER — DIPHENHYDRAMINE HCL 50 MG/ML IJ SOLN
25.00 mg | Freq: Once | INTRAMUSCULAR | Status: AC | PRN
Start: 2022-03-11 — End: 2022-03-11

## (undated) MED ORDER — EPINEPHRINE 0.3 MG/0.3ML IJ SOAJ
0.30 mg | INTRAMUSCULAR | Status: AC | PRN
Start: 2022-03-11 — End: 2022-03-11

## (undated) MED ORDER — HYDROCORTISONE SOD SUCCINATE 100 MG IJ SOLR (CUSTOM)
100.0000 mg | Freq: Once | INTRAMUSCULAR | Status: AC | PRN
Start: 2021-09-24 — End: 2021-09-24

## (undated) MED ORDER — FAMOTIDINE (PF) 20 MG/2ML IV SOLN
20.0000 mg | Freq: Once | INTRAVENOUS | Status: AC | PRN
Start: 2021-03-13 — End: 2021-02-28

## (undated) MED ORDER — FAMOTIDINE (PF) 20 MG/2ML IV SOLN
20.00 mg | Freq: Once | INTRAVENOUS | Status: AC | PRN
Start: 2022-11-04 — End: 2022-11-05

## (undated) MED ORDER — DIPHENHYDRAMINE HCL 50 MG/ML IJ SOLN
25.0000 mg | Freq: Once | INTRAMUSCULAR | Status: AC | PRN
Start: 2021-11-19 — End: 2021-11-19

## (undated) MED ORDER — ALBUTEROL SULFATE (5 MG/ML) 0.5% IN NEBU
2.50 mg | INHALATION_SOLUTION | Freq: Once | RESPIRATORY_TRACT | Status: AC | PRN
Start: 2022-12-02 — End: 2022-12-03

## (undated) MED ORDER — DIPHENHYDRAMINE HCL 50 MG/ML IJ SOLN
25.00 mg | Freq: Once | INTRAMUSCULAR | Status: AC | PRN
Start: 2021-11-19 — End: 2021-11-20

## (undated) MED ORDER — FAMOTIDINE (PF) 20 MG/2ML IV SOLN
20.00 mg | Freq: Once | INTRAVENOUS | Status: AC | PRN
Start: 2021-12-17 — End: 2021-12-17

## (undated) MED ORDER — IMMUNE GLOBULIN (HUMAN) 20 GM/200ML IJ SOLN
0.5000 g/kg | Freq: Once | INTRAMUSCULAR | Status: AC
Start: 2021-02-28 — End: 2021-02-28

## (undated) MED ORDER — IMMUNE GLOBULIN (HUMAN) 20 GM/200ML IJ SOLN
0.5000 g/kg | Freq: Once | INTRAMUSCULAR | Status: AC
Start: 2023-03-09 — End: 2023-03-03

## (undated) MED ORDER — FAMOTIDINE (PF) 20 MG/2ML IV SOLN
20.00 mg | Freq: Once | INTRAVENOUS | Status: AC | PRN
Start: 2022-08-19 — End: 2022-08-20

## (undated) MED ORDER — DIPHENHYDRAMINE HCL 25 MG OR TABS OR CAPS CUSTOM
25.00 mg | ORAL_CAPSULE | Freq: Once | ORAL | Status: AC | PRN
Start: 2022-02-11 — End: ?

## (undated) MED ORDER — ALBUTEROL SULFATE (5 MG/ML) 0.5% IN NEBU
2.50 mg | INHALATION_SOLUTION | Freq: Once | RESPIRATORY_TRACT | Status: AC | PRN
Start: 2022-11-04 — End: 2022-11-05

## (undated) MED ORDER — DEXTROSE 5 % IV SOLN
INTRAVENOUS | Status: AC
Start: 2023-01-06 — End: 2023-01-07

## (undated) MED ORDER — SODIUM CHLORIDE 0.9 % IV SOLN
INTRAVENOUS | Status: AC | PRN
Start: 2021-07-30 — End: ?

## (undated) MED ORDER — CETIRIZINE HCL 10 MG OR TABS
10.00 mg | ORAL_TABLET | Freq: Once | ORAL | Status: AC
Start: 2022-03-11 — End: 2022-03-11

## (undated) MED ORDER — DIPHENHYDRAMINE HCL 50 MG/ML IJ SOLN
25.0000 mg | Freq: Once | INTRAMUSCULAR | Status: AC | PRN
Start: 2021-05-08 — End: 2021-04-25

## (undated) MED ORDER — SODIUM CHLORIDE 0.9 % IV SOLN
INTRAVENOUS | Status: AC | PRN
Start: 2021-07-02 — End: ?

## (undated) MED ORDER — DEXTROSE 5 % IV SOLN
INTRAVENOUS | Status: AC
Start: 2022-07-23 — End: 2022-07-25

## (undated) MED ORDER — DIPHENHYDRAMINE HCL 25 MG OR TABS OR CAPS CUSTOM
25.0000 mg | ORAL_CAPSULE | Freq: Once | ORAL | Status: AC | PRN
Start: 2021-10-22 — End: ?

## (undated) MED ORDER — EPINEPHRINE 0.3 MG/0.3ML IJ SOAJ
0.30 mg | INTRAMUSCULAR | Status: AC | PRN
Start: 2021-11-19 — End: 2021-11-20

## (undated) MED ORDER — DIPHENHYDRAMINE HCL 50 MG/ML IJ SOLN
25.0000 mg | Freq: Once | INTRAMUSCULAR | Status: AC | PRN
Start: 2021-09-24 — End: 2021-09-24

## (undated) MED ORDER — ALVIMOPAN 12 MG OR CAPS
12.00 mg | ORAL_CAPSULE | Freq: Once | ORAL | Status: AC
Start: 2021-06-26 — End: 2021-06-26

## (undated) MED ORDER — DIPHENHYDRAMINE HCL 50 MG/ML IJ SOLN
25.00 mg | Freq: Once | INTRAMUSCULAR | Status: AC | PRN
Start: 2022-04-29 — End: 2022-04-08

## (undated) MED ORDER — DIPHENHYDRAMINE HCL 50 MG/ML IJ SOLN
25.0000 mg | Freq: Once | INTRAMUSCULAR | Status: AC | PRN
Start: 2023-03-09 — End: 2023-03-03

## (undated) MED ORDER — EPINEPHRINE 0.3 MG/0.3ML IJ SOAJ
0.30 mg | INTRAMUSCULAR | Status: AC | PRN
Start: 2021-09-24 — End: 2021-09-25

## (undated) MED ORDER — ACETAMINOPHEN 325 MG PO TABS
650.0000 mg | ORAL_TABLET | Freq: Once | ORAL | Status: AC
Start: 2023-01-12 — End: 2023-01-06

## (undated) MED ORDER — ALBUTEROL SULFATE (5 MG/ML) 0.5% IN NEBU
2.5000 mg | INHALATION_SOLUTION | Freq: Once | RESPIRATORY_TRACT | Status: AC | PRN
Start: 2023-01-07 — End: 2023-01-06

## (undated) MED ORDER — ALBUTEROL SULFATE (5 MG/ML) 0.5% IN NEBU
2.5000 mg | INHALATION_SOLUTION | Freq: Once | RESPIRATORY_TRACT | Status: AC | PRN
Start: 2021-02-13 — End: 2021-01-31

## (undated) MED ORDER — DEXTROSE 5 % IV SOLN
INTRAVENOUS | Status: AC
Start: 2022-08-19 — End: 2022-08-21

## (undated) MED ORDER — DIPHENHYDRAMINE HCL 25 MG OR TABS OR CAPS CUSTOM
25.0000 mg | ORAL_CAPSULE | Freq: Once | ORAL | Status: AC | PRN
Start: 2021-06-05 — End: ?

## (undated) MED ORDER — ALBUTEROL SULFATE (5 MG/ML) 0.5% IN NEBU
2.50 mg | INHALATION_SOLUTION | Freq: Once | RESPIRATORY_TRACT | Status: AC | PRN
Start: 2022-02-11 — End: 2022-02-11

## (undated) MED ORDER — DIPHENHYDRAMINE HCL 25 MG OR TABS OR CAPS CUSTOM
25.00 mg | ORAL_CAPSULE | Freq: Once | ORAL | Status: AC | PRN
Start: 2022-06-25 — End: ?

## (undated) MED ORDER — DEXTROSE 5 % IV SOLN
INTRAVENOUS | Status: AC
Start: 2021-10-22 — End: 2021-10-22

## (undated) MED ORDER — SODIUM CHLORIDE 0.9 % IV SOLN
INTRAVENOUS | Status: AC | PRN
Start: 2023-01-12 — End: ?

## (undated) MED ORDER — SODIUM CHLORIDE 0.9 % IV SOLN
INTRAVENOUS | Status: AC | PRN
Start: 2022-08-19 — End: ?

## (undated) MED ORDER — ACETAMINOPHEN 325 MG PO TABS
650.0000 mg | ORAL_TABLET | Freq: Once | ORAL | Status: AC
Start: 2021-10-22 — End: 2021-10-22

## (undated) MED ORDER — DIPHENHYDRAMINE HCL 25 MG OR TABS OR CAPS CUSTOM
25.0000 mg | ORAL_CAPSULE | Freq: Once | ORAL | Status: AC | PRN
Start: 2021-09-24 — End: ?

## (undated) MED ORDER — IMMUNE GLOBULIN (HUMAN) 20 GM/200ML IJ SOLN
0.5000 g/kg | Freq: Once | INTRAMUSCULAR | Status: AC
Start: 2023-01-12 — End: 2023-01-06

## (undated) MED ORDER — DIPHENHYDRAMINE HCL 25 MG OR TABS OR CAPS CUSTOM
25.00 mg | ORAL_CAPSULE | Freq: Once | ORAL | Status: AC | PRN
Start: 2022-05-27 — End: ?

## (undated) MED ORDER — IMMUNE GLOBULIN (HUMAN) 20 GM/200ML IJ SOLN
0.5000 g/kg | Freq: Once | INTRAMUSCULAR | Status: AC
Start: 2021-04-25 — End: 2021-04-25

## (undated) MED ORDER — HYDROCORTISONE SOD SUCCINATE 100 MG IJ SOLR (CUSTOM)
100.00 mg | Freq: Once | INTRAMUSCULAR | Status: AC | PRN
Start: 2022-08-19 — End: 2022-08-20

## (undated) MED ORDER — EPINEPHRINE 0.3 MG/0.3ML IJ SOAJ
0.3000 mg | INTRAMUSCULAR | Status: AC | PRN
Start: 2021-09-24 — End: 2021-09-24

## (undated) MED ORDER — ACETAMINOPHEN 325 MG PO TABS
650.0000 mg | ORAL_TABLET | Freq: Once | ORAL | Status: AC
Start: 2022-11-13 — End: 2022-11-12

## (undated) MED ORDER — CETIRIZINE HCL 10 MG OR TABS
10.00 mg | ORAL_TABLET | Freq: Once | ORAL | Status: AC
Start: 2022-06-25 — End: 2022-06-26

## (undated) MED ORDER — ACETAMINOPHEN 325 MG PO TABS
650.00 mg | ORAL_TABLET | Freq: Once | ORAL | Status: AC
Start: 2021-08-27 — End: 2021-08-28

## (undated) MED ORDER — DIPHENHYDRAMINE HCL 25 MG OR TABS OR CAPS CUSTOM
25.00 mg | ORAL_CAPSULE | Freq: Once | ORAL | Status: AC | PRN
Start: 2021-07-02 — End: ?

## (undated) MED ORDER — ALBUTEROL SULFATE (5 MG/ML) 0.5% IN NEBU
2.50 mg | INHALATION_SOLUTION | Freq: Once | RESPIRATORY_TRACT | Status: AC | PRN
Start: 2021-09-24 — End: 2021-09-25

## (undated) MED ORDER — DIPHENHYDRAMINE HCL 25 MG OR TABS OR CAPS CUSTOM
25.0000 mg | ORAL_CAPSULE | Freq: Once | ORAL | Status: AC | PRN
Start: 2023-02-09 — End: ?

## (undated) MED ORDER — DEXTROSE 5 % IV SOLN
INTRAVENOUS | Status: AC
Start: 2021-06-05 — End: 2021-05-23

## (undated) MED ORDER — HYDROCORTISONE SOD SUCCINATE 100 MG IJ SOLR (CUSTOM)
100.00 mg | Freq: Once | INTRAMUSCULAR | Status: AC | PRN
Start: 2022-09-16 — End: 2022-09-17

## (undated) MED ORDER — DEXTROSE 5 % IV SOLN
INTRAVENOUS | Status: AC
Start: 2021-09-24 — End: 2021-09-25

## (undated) MED ORDER — SODIUM CHLORIDE 0.9 % IV SOLN
INTRAVENOUS | Status: AC | PRN
Start: 2022-09-16 — End: ?

## (undated) MED ORDER — FAMOTIDINE (PF) 20 MG/2ML IV SOLN
20.0000 mg | Freq: Once | INTRAVENOUS | Status: AC | PRN
Start: 2021-11-19 — End: 2021-11-19

## (undated) MED ORDER — ACETAMINOPHEN 325 MG PO TABS
650.00 mg | ORAL_TABLET | Freq: Once | ORAL | Status: AC
Start: 2022-07-23 — End: 2022-07-24

## (undated) MED ORDER — IMMUNE GLOBULIN (HUMAN) 20 GM/200ML IJ SOLN
0.5000 g/kg | Freq: Once | INTRAMUSCULAR | Status: AC
Start: 2021-01-31 — End: 2021-01-31

## (undated) MED ORDER — SODIUM CHLORIDE 0.9 % IV SOLN
INTRAVENOUS | Status: AC | PRN
Start: 2023-03-09 — End: ?

## (undated) MED ORDER — DEXTROSE 5 % IV SOLN
INTRAVENOUS | Status: AC
Start: 2022-11-12 — End: 2022-11-13

## (undated) MED ORDER — ACETAMINOPHEN 325 MG PO TABS
650.00 mg | ORAL_TABLET | Freq: Once | ORAL | Status: AC
Start: 2022-12-02 — End: 2022-12-03

## (undated) MED ORDER — ALBUTEROL SULFATE (5 MG/ML) 0.5% IN NEBU
2.50 mg | INHALATION_SOLUTION | Freq: Once | RESPIRATORY_TRACT | Status: AC | PRN
Start: 2021-07-30 — End: 2021-07-31

## (undated) MED ORDER — IMMUNE GLOBULIN (HUMAN) 20 GM/200ML IJ SOLN
0.50 g/kg | Freq: Once | INTRAMUSCULAR | Status: AC
Start: 2021-12-17 — End: 2021-12-17

## (undated) MED ORDER — ACETAMINOPHEN 325 MG PO TABS
650.0000 mg | ORAL_TABLET | Freq: Once | ORAL | Status: AC
Start: 2021-06-05 — End: 2021-05-23

## (undated) MED ORDER — DEXTROSE 5 % IV SOLN
INTRAVENOUS | Status: AC
Start: 2022-05-27 — End: 2022-05-07

## (undated) MED ORDER — ACETAMINOPHEN 325 MG PO TABS
650.0000 mg | ORAL_TABLET | Freq: Once | ORAL | Status: AC
Start: 2021-03-13 — End: 2021-02-28

## (undated) MED ORDER — IMMUNE GLOBULIN (HUMAN) 20 GM/200ML IJ SOLN
0.5000 g/kg | Freq: Once | INTRAMUSCULAR | Status: AC
Start: 2021-06-05 — End: 2021-05-23

## (undated) MED ORDER — ALBUTEROL SULFATE (5 MG/ML) 0.5% IN NEBU
2.50 mg | INHALATION_SOLUTION | Freq: Once | RESPIRATORY_TRACT | Status: AC | PRN
Start: 2021-11-19 — End: 2021-11-20

## (undated) MED ORDER — DIPHENHYDRAMINE HCL 50 MG/ML IJ SOLN
25.00 mg | Freq: Once | INTRAMUSCULAR | Status: AC | PRN
Start: 2021-08-27 — End: 2021-08-28

## (undated) MED ORDER — FAMOTIDINE (PF) 20 MG/2ML IV SOLN
20.0000 mg | Freq: Once | INTRAVENOUS | Status: AC | PRN
Start: 2023-03-09 — End: 2023-03-03

## (undated) MED ORDER — FAMOTIDINE (PF) 20 MG/2ML IV SOLN
20.00 mg | Freq: Once | INTRAVENOUS | Status: AC | PRN
Start: 2022-03-11 — End: 2022-03-11

## (undated) MED ORDER — IMMUNE GLOBULIN (HUMAN) 20 GM/200ML IJ SOLN
0.5000 g/kg | Freq: Once | INTRAMUSCULAR | Status: AC
Start: 2022-12-11 — End: 2022-12-10

## (undated) MED ORDER — FAMOTIDINE (PF) 20 MG/2ML IV SOLN
20.0000 mg | Freq: Once | INTRAVENOUS | Status: AC | PRN
Start: 2023-02-09 — End: 2023-02-03

## (undated) MED ORDER — CETIRIZINE HCL 10 MG OR TABS
10.0000 mg | ORAL_TABLET | Freq: Once | ORAL | Status: AC
Start: 2023-02-04 — End: 2023-02-03

## (undated) MED ORDER — DEXTROSE 5 % IV SOLN
INTRAVENOUS | Status: AC
Start: 2021-10-22 — End: 2021-10-23

## (undated) MED ORDER — LACTATED RINGERS IV SOLN
INTRAVENOUS | Status: AC
Start: 2022-07-16 — End: ?

## (undated) MED ORDER — ALBUTEROL SULFATE (5 MG/ML) 0.5% IN NEBU
2.50 mg | INHALATION_SOLUTION | Freq: Once | RESPIRATORY_TRACT | Status: AC | PRN
Start: 2022-04-29 — End: 2022-04-08

## (undated) MED ORDER — ACETAMINOPHEN 325 MG PO TABS
650.0000 mg | ORAL_TABLET | Freq: Once | ORAL | Status: AC
Start: 2023-03-09 — End: 2023-03-03

## (undated) MED ORDER — ALBUTEROL SULFATE (5 MG/ML) 0.5% IN NEBU
2.50 mg | INHALATION_SOLUTION | Freq: Once | RESPIRATORY_TRACT | Status: AC | PRN
Start: 2022-08-19 — End: 2022-08-20

## (undated) MED ORDER — IMMUNE GLOBULIN (HUMAN) 20 GM/200ML IJ SOLN
0.5000 g/kg | Freq: Once | INTRAMUSCULAR | Status: AC
Start: 2021-03-28 — End: 2021-03-28

## (undated) MED ORDER — HYDROCORTISONE SOD SUCCINATE 100 MG IJ SOLR (CUSTOM)
100.0000 mg | Freq: Once | INTRAMUSCULAR | Status: AC | PRN
Start: 2022-12-10 — End: 2022-12-10

## (undated) MED ORDER — ACETAMINOPHEN 325 MG PO TABS
650.0000 mg | ORAL_TABLET | Freq: Once | ORAL | Status: AC
Start: 2021-11-19 — End: 2021-11-19

## (undated) MED ORDER — ALBUTEROL SULFATE (5 MG/ML) 0.5% IN NEBU
2.5000 mg | INHALATION_SOLUTION | Freq: Once | RESPIRATORY_TRACT | Status: AC | PRN
Start: 2021-04-10 — End: 2021-03-28

## (undated) MED ORDER — SODIUM CHLORIDE 0.9 % IV SOLN
INTRAVENOUS | Status: AC | PRN
Start: 2022-02-11 — End: ?

## (undated) MED ORDER — SODIUM CHLORIDE 0.9 % IV SOLN
INTRAVENOUS | Status: AC | PRN
Start: 2023-02-09 — End: ?

## (undated) MED ORDER — SODIUM CHLORIDE 0.9 % IV SOLN
INTRAVENOUS | Status: AC | PRN
Start: 2022-06-25 — End: ?

## (undated) MED ORDER — IMMUNE GLOBULIN (HUMAN) 20 GM/200ML IJ SOLN
0.50 g/kg | Freq: Once | INTRAMUSCULAR | Status: AC
Start: 2021-07-02 — End: 2021-07-03

## (undated) MED ORDER — CETIRIZINE HCL 10 MG OR TABS
10.00 mg | ORAL_TABLET | Freq: Once | ORAL | Status: AC
Start: 2021-08-27 — End: 2021-08-28

## (undated) MED ORDER — DIPHENHYDRAMINE HCL 50 MG/ML IJ SOLN
25.00 mg | Freq: Once | INTRAMUSCULAR | Status: AC | PRN
Start: 2022-06-25 — End: 2022-06-25

## (undated) MED ORDER — ACETAMINOPHEN 325 MG PO TABS
650.00 mg | ORAL_TABLET | Freq: Once | ORAL | Status: AC
Start: 2022-02-11 — End: 2022-02-11

## (undated) MED ORDER — DIPHENHYDRAMINE HCL 25 MG OR TABS OR CAPS CUSTOM
25.00 mg | ORAL_CAPSULE | Freq: Once | ORAL | Status: AC | PRN
Start: 2022-12-02 — End: ?

## (undated) MED ORDER — DIPHENHYDRAMINE HCL 50 MG/ML IJ SOLN
25.00 mg | Freq: Once | INTRAMUSCULAR | Status: AC | PRN
Start: 2021-07-30 — End: 2021-07-31

## (undated) MED ORDER — DIPHENHYDRAMINE HCL 25 MG OR TABS OR CAPS CUSTOM
25.0000 mg | ORAL_CAPSULE | Freq: Once | ORAL | Status: AC | PRN
Start: 2023-01-12 — End: ?

## (undated) MED ORDER — DEXTROSE 5 % IV SOLN
INTRAVENOUS | Status: AC
Start: 2022-04-29 — End: 2022-04-09

## (undated) MED ORDER — HYDROCORTISONE SOD SUCCINATE 100 MG IJ SOLR (CUSTOM)
100.00 mg | Freq: Once | INTRAMUSCULAR | Status: AC | PRN
Start: 2021-10-22 — End: 2021-10-23

## (undated) MED ORDER — IMMUNE GLOBULIN (HUMAN) 20 GM/200ML IJ SOLN
0.5000 g/kg | Freq: Once | INTRAMUSCULAR | Status: AC
Start: 2021-02-13 — End: 2021-01-31

## (undated) MED ORDER — IMMUNE GLOBULIN (HUMAN) 20 GM/200ML IJ SOLN
0.50 g/kg | Freq: Once | INTRAMUSCULAR | Status: AC
Start: 2022-07-23 — End: 2022-07-24

## (undated) MED ORDER — DIPHENHYDRAMINE HCL 50 MG/ML IJ SOLN
25.00 mg | Freq: Once | INTRAMUSCULAR | Status: AC | PRN
Start: 2021-10-22 — End: 2021-10-23

## (undated) MED ORDER — CETIRIZINE HCL 10 MG OR TABS
10.00 mg | ORAL_TABLET | Freq: Once | ORAL | Status: AC
Start: 2022-12-02 — End: 2022-12-03

## (undated) MED ORDER — CETIRIZINE HCL 10 MG OR TABS
10.00 mg | ORAL_TABLET | Freq: Once | ORAL | Status: AC
Start: 2022-02-11 — End: 2022-02-11

## (undated) MED ORDER — HEPARIN SODIUM (PORCINE) 5000 UNIT/ML IJ SOLN
5000.00 [IU] | Freq: Once | INTRAMUSCULAR | Status: AC
Start: 2022-07-16 — End: 2022-07-16

## (undated) MED ORDER — HYDROCORTISONE SOD SUCCINATE 100 MG IJ SOLR (CUSTOM)
100.00 mg | Freq: Once | INTRAMUSCULAR | Status: AC | PRN
Start: 2022-06-25 — End: 2022-06-25

## (undated) MED ORDER — DIPHENHYDRAMINE HCL 50 MG/ML IJ SOLN
25.00 mg | Freq: Once | INTRAMUSCULAR | Status: AC | PRN
Start: 2022-05-27 — End: 2022-05-06

## (undated) MED ORDER — IMMUNE GLOBULIN (HUMAN) 20 GM/200ML IJ SOLN
0.5000 g/kg | Freq: Once | INTRAMUSCULAR | Status: AC
Start: 2021-01-03 — End: 2021-01-03

## (undated) MED ORDER — SODIUM CHLORIDE 0.9 % IV SOLN
INTRAVENOUS | Status: AC | PRN
Start: 2021-03-13 — End: ?

## (undated) MED ORDER — ACETAMINOPHEN 325 MG PO TABS
650.0000 mg | ORAL_TABLET | Freq: Once | ORAL | Status: AC
Start: 2021-02-13 — End: 2021-01-31

## (undated) MED ORDER — ALBUTEROL SULFATE (5 MG/ML) 0.5% IN NEBU
2.5000 mg | INHALATION_SOLUTION | Freq: Once | RESPIRATORY_TRACT | Status: AC | PRN
Start: 2023-02-09 — End: 2023-02-03

## (undated) MED ORDER — EPINEPHRINE 0.3 MG/0.3ML IJ SOAJ
0.30 mg | INTRAMUSCULAR | Status: AC | PRN
Start: 2022-08-19 — End: 2022-08-20

## (undated) MED ORDER — CETIRIZINE HCL 10 MG OR TABS
10.0000 mg | ORAL_TABLET | Freq: Once | ORAL | Status: AC
Start: 2022-12-11 — End: 2022-12-10

## (undated) MED ORDER — DEXTROSE 5 % IV SOLN
INTRAVENOUS | Status: AC
Start: 2021-02-13 — End: 2021-01-31

## (undated) MED ORDER — ACETAMINOPHEN 325 MG PO TABS
650.00 mg | ORAL_TABLET | Freq: Once | ORAL | Status: AC
Start: 2021-07-30 — End: 2021-07-31

## (undated) MED ORDER — ERTAPENEM SODIUM 1 GM IJ SOLR
1000.00 mg | Freq: Once | INTRAMUSCULAR | Status: AC
Start: 2021-06-26 — End: 2021-06-26

## (undated) MED ORDER — DIPHENHYDRAMINE HCL 50 MG/ML IJ SOLN
25.0000 mg | Freq: Once | INTRAMUSCULAR | Status: AC | PRN
Start: 2021-10-22 — End: 2021-10-22

## (undated) MED ORDER — DIPHENHYDRAMINE HCL 25 MG OR TABS OR CAPS CUSTOM
25.0000 mg | ORAL_CAPSULE | Freq: Once | ORAL | Status: AC | PRN
Start: 2021-01-16 — End: ?

## (undated) MED ORDER — DIPHENHYDRAMINE HCL 25 MG OR TABS OR CAPS CUSTOM
25.00 mg | ORAL_CAPSULE | Freq: Once | ORAL | Status: AC | PRN
Start: 2022-11-04 — End: ?

## (undated) MED ORDER — FAMOTIDINE (PF) 20 MG/2ML IV SOLN
20.00 mg | Freq: Once | INTRAVENOUS | Status: AC | PRN
Start: 2022-09-16 — End: 2022-09-17

## (undated) MED ORDER — DIPHENHYDRAMINE HCL 25 MG OR TABS OR CAPS CUSTOM
25.00 mg | ORAL_CAPSULE | Freq: Once | ORAL | Status: AC | PRN
Start: 2021-12-17 — End: ?

## (undated) MED ORDER — ACETAMINOPHEN 325 MG PO TABS
650.00 mg | ORAL_TABLET | Freq: Once | ORAL | Status: AC
Start: 2021-07-02 — End: 2021-07-03

## (undated) MED ORDER — DEXTROSE 5 % IV SOLN
INTRAVENOUS | Status: AC
Start: 2021-01-16 — End: 2021-01-03

## (undated) MED ORDER — DIPHENHYDRAMINE HCL 50 MG/ML IJ SOLN
25.00 mg | Freq: Once | INTRAMUSCULAR | Status: AC | PRN
Start: 2021-12-17 — End: 2021-12-17

## (undated) MED ORDER — ALBUTEROL SULFATE (5 MG/ML) 0.5% IN NEBU
2.50 mg | INHALATION_SOLUTION | Freq: Once | RESPIRATORY_TRACT | Status: AC | PRN
Start: 2022-07-23 — End: 2022-07-24

## (undated) MED ORDER — EPINEPHRINE 0.3 MG/0.3ML IJ SOAJ
0.30 mg | INTRAMUSCULAR | Status: AC | PRN
Start: 2022-02-11 — End: 2022-02-11

## (undated) MED ORDER — ACETAMINOPHEN 325 MG PO TABS
650.0000 mg | ORAL_TABLET | Freq: Once | ORAL | Status: AC
Start: 2023-02-09 — End: 2023-02-03

## (undated) MED ORDER — DIPHENHYDRAMINE HCL 25 MG OR TABS OR CAPS CUSTOM
25.0000 mg | ORAL_CAPSULE | Freq: Once | ORAL | Status: AC | PRN
Start: 2021-05-08 — End: ?

## (undated) MED ORDER — CETIRIZINE HCL 10 MG OR TABS
10.00 mg | ORAL_TABLET | Freq: Once | ORAL | Status: AC
Start: 2022-09-16 — End: 2022-09-17

## (undated) MED ORDER — HYDROCORTISONE SOD SUCCINATE 100 MG IJ SOLR (CUSTOM)
100.0000 mg | Freq: Once | INTRAMUSCULAR | Status: AC | PRN
Start: 2021-05-08 — End: 2021-04-25

## (undated) MED ORDER — ALBUTEROL SULFATE (5 MG/ML) 0.5% IN NEBU
2.50 mg | INHALATION_SOLUTION | Freq: Once | RESPIRATORY_TRACT | Status: AC | PRN
Start: 2021-12-17 — End: 2021-12-17

## (undated) MED ORDER — DEXTROSE 5 % IV SOLN
INTRAVENOUS | Status: AC
Start: 2022-11-04 — End: 2022-11-06

## (undated) MED ORDER — DIPHENHYDRAMINE HCL 50 MG/ML IJ SOLN
25.0000 mg | Freq: Once | INTRAMUSCULAR | Status: AC | PRN
Start: 2021-01-16 — End: 2021-01-03

## (undated) MED ORDER — ALBUTEROL SULFATE (5 MG/ML) 0.5% IN NEBU
2.5000 mg | INHALATION_SOLUTION | Freq: Once | RESPIRATORY_TRACT | Status: AC | PRN
Start: 2021-01-16 — End: 2021-01-03

## (undated) MED ORDER — DIPHENHYDRAMINE HCL 25 MG OR TABS OR CAPS CUSTOM
25.00 mg | ORAL_CAPSULE | Freq: Once | ORAL | Status: AC | PRN
Start: 2022-09-16 — End: ?

## (undated) MED ORDER — FAMOTIDINE (PF) 20 MG/2ML IV SOLN
20.00 mg | Freq: Once | INTRAVENOUS | Status: AC | PRN
Start: 2022-01-15 — End: 2022-01-14

## (undated) MED ORDER — ACETAMINOPHEN 325 MG PO TABS
650.0000 mg | ORAL_TABLET | Freq: Once | ORAL | Status: AC
Start: 2021-09-24 — End: 2021-09-24

## (undated) MED ORDER — SODIUM CHLORIDE 0.9 % IV SOLN
INTRAVENOUS | Status: AC | PRN
Start: 2022-12-02 — End: ?

## (undated) MED ORDER — DEXTROSE 5 % IV SOLN
INTRAVENOUS | Status: AC
Start: 2022-09-16 — End: 2022-09-18

## (undated) MED ORDER — IMMUNE GLOBULIN (HUMAN) 20 GM/200ML IJ SOLN
0.50 g/kg | Freq: Once | INTRAMUSCULAR | Status: AC
Start: 2022-11-04 — End: 2022-11-05

## (undated) MED ORDER — IMMUNE GLOBULIN (HUMAN) 20 GM/200ML IJ SOLN
0.50 g/kg | Freq: Once | INTRAMUSCULAR | Status: AC
Start: 2022-09-16 — End: 2022-09-17

## (undated) MED ORDER — EPINEPHRINE 0.3 MG/0.3ML IJ SOAJ
0.3000 mg | INTRAMUSCULAR | Status: AC | PRN
Start: 2022-11-13 — End: 2022-11-12

## (undated) MED ORDER — ACETAMINOPHEN 325 MG PO TABS
650.00 mg | ORAL_TABLET | Freq: Once | ORAL | Status: AC
Start: 2022-04-29 — End: 2022-04-08

## (undated) MED ORDER — HYDROCORTISONE SOD SUCCINATE 100 MG IJ SOLR (CUSTOM)
100.00 mg | Freq: Once | INTRAMUSCULAR | Status: AC | PRN
Start: 2022-03-11 — End: 2022-03-11

## (undated) MED ORDER — EPINEPHRINE 0.3 MG/0.3ML IJ SOAJ
0.3000 mg | INTRAMUSCULAR | Status: AC | PRN
Start: 2021-10-22 — End: 2021-10-22

## (undated) MED ORDER — FAMOTIDINE (PF) 20 MG/2ML IV SOLN
20.0000 mg | Freq: Once | INTRAVENOUS | Status: AC | PRN
Start: 2022-11-13 — End: 2022-11-12

## (undated) MED ORDER — DEXTROSE 5 % IV SOLN
INTRAVENOUS | Status: AC
Start: 2023-02-09 — End: 2023-02-04

## (undated) MED ORDER — ACETAMINOPHEN 325 MG PO TABS
650.00 mg | ORAL_TABLET | Freq: Once | ORAL | Status: AC
Start: 2022-06-25 — End: 2022-06-25

## (undated) MED ORDER — IMMUNE GLOBULIN (HUMAN) 20 GM/200ML IJ SOLN
0.50 g/kg | Freq: Once | INTRAMUSCULAR | Status: AC
Start: 2021-07-30 — End: 2021-07-31

## (undated) MED ORDER — CETIRIZINE HCL 10 MG OR TABS
10.0000 mg | ORAL_TABLET | Freq: Once | ORAL | Status: AC
Start: 2021-03-13 — End: 2021-02-28

## (undated) MED ORDER — ACETAMINOPHEN 325 MG PO TABS
975.00 mg | ORAL_TABLET | Freq: Once | ORAL | Status: AC
Start: 2022-07-16 — End: 2022-07-16

## (undated) MED ORDER — DEXTROSE 5 % IV SOLN
INTRAVENOUS | Status: AC
Start: 2022-02-11 — End: 2022-02-12

## (undated) MED ORDER — IMMUNE GLOBULIN (HUMAN) 20 GM/200ML IJ SOLN
0.5000 g/kg | Freq: Once | INTRAMUSCULAR | Status: AC
Start: 2021-05-23 — End: 2021-05-23

## (undated) MED ORDER — DIPHENHYDRAMINE HCL 50 MG/ML IJ SOLN
25.0000 mg | Freq: Once | INTRAMUSCULAR | Status: AC | PRN
Start: 2021-02-13 — End: 2021-01-31

## (undated) MED ORDER — FAMOTIDINE (PF) 20 MG/2ML IV SOLN
20.00 mg | Freq: Once | INTRAVENOUS | Status: AC | PRN
Start: 2022-05-27 — End: 2022-05-06

## (undated) MED ORDER — ALBUTEROL SULFATE (5 MG/ML) 0.5% IN NEBU
2.50 mg | INHALATION_SOLUTION | Freq: Once | RESPIRATORY_TRACT | Status: AC | PRN
Start: 2022-01-15 — End: 2022-01-14

## (undated) MED ORDER — EPINEPHRINE 0.3 MG/0.3ML IJ SOAJ
0.3000 mg | INTRAMUSCULAR | Status: AC | PRN
Start: 2023-01-07 — End: 2023-01-06

## (undated) MED ORDER — CETIRIZINE HCL 10 MG OR TABS
10.00 mg | ORAL_TABLET | Freq: Once | ORAL | Status: AC
Start: 2021-07-30 — End: 2021-07-31

## (undated) MED ORDER — ALBUTEROL SULFATE (5 MG/ML) 0.5% IN NEBU
2.5000 mg | INHALATION_SOLUTION | Freq: Once | RESPIRATORY_TRACT | Status: AC | PRN
Start: 2021-11-19 — End: 2021-11-19

## (undated) MED ORDER — DIPHENHYDRAMINE HCL 50 MG/ML IJ SOLN
25.00 mg | Freq: Once | INTRAMUSCULAR | Status: AC | PRN
Start: 2022-01-15 — End: 2022-01-14

## (undated) MED ORDER — ACETAMINOPHEN 325 MG PO TABS
650.00 mg | ORAL_TABLET | Freq: Once | ORAL | Status: AC
Start: 2022-11-04 — End: 2022-11-05

## (undated) MED ORDER — DIPHENHYDRAMINE HCL 25 MG OR TABS OR CAPS CUSTOM
25.0000 mg | ORAL_CAPSULE | Freq: Once | ORAL | Status: AC | PRN
Start: 2023-03-04 — End: ?

## (undated) MED ORDER — IMMUNE GLOBULIN (HUMAN) 20 GM/200ML IJ SOLN
0.50 g/kg | Freq: Once | INTRAMUSCULAR | Status: AC
Start: 2021-09-24 — End: 2021-09-25

## (undated) MED ORDER — ALBUTEROL SULFATE (5 MG/ML) 0.5% IN NEBU
2.50 mg | INHALATION_SOLUTION | Freq: Once | RESPIRATORY_TRACT | Status: AC | PRN
Start: 2021-07-02 — End: 2021-07-03

## (undated) MED ORDER — ACETAMINOPHEN 325 MG PO TABS
650.0000 mg | ORAL_TABLET | Freq: Once | ORAL | Status: AC
Start: 2022-12-10 — End: 2022-12-10

## (undated) MED ORDER — HYDROCORTISONE SOD SUCCINATE 100 MG IJ SOLR (CUSTOM)
100.0000 mg | Freq: Once | INTRAMUSCULAR | Status: AC | PRN
Start: 2021-11-19 — End: 2021-11-19

## (undated) MED ORDER — CETIRIZINE HCL 10 MG OR TABS
10.00 mg | ORAL_TABLET | Freq: Once | ORAL | Status: AC
Start: 2021-10-22 — End: 2021-10-23

## (undated) MED ORDER — FAMOTIDINE (PF) 20 MG/2ML IV SOLN
20.0000 mg | Freq: Once | INTRAVENOUS | Status: AC | PRN
Start: 2021-05-08 — End: 2021-04-25

## (undated) MED ORDER — SODIUM CHLORIDE 0.9 % IV SOLN
1000.00 mg | Freq: Once | INTRAVENOUS | Status: AC
Start: 2022-07-16 — End: 2022-07-16

## (undated) MED ORDER — DEXTROSE 5 % IV SOLN
INTRAVENOUS | Status: AC
Start: 2022-01-15 — End: 2022-01-15

## (undated) MED ORDER — DIPHENHYDRAMINE HCL 50 MG/ML IJ SOLN
25.00 mg | Freq: Once | INTRAMUSCULAR | Status: AC | PRN
Start: 2022-11-04 — End: 2022-11-05

## (undated) MED ORDER — FAMOTIDINE (PF) 20 MG/2ML IV SOLN
20.0000 mg | Freq: Once | INTRAVENOUS | Status: AC | PRN
Start: 2021-01-16 — End: 2021-01-03

## (undated) MED ORDER — ALVIMOPAN 12 MG OR CAPS
12.00 mg | ORAL_CAPSULE | Freq: Once | ORAL | Status: AC
Start: 2022-07-16 — End: 2022-07-16

## (undated) MED ORDER — DIPHENHYDRAMINE HCL 50 MG/ML IJ SOLN
25.0000 mg | Freq: Once | INTRAMUSCULAR | Status: AC | PRN
Start: 2021-03-13 — End: 2021-02-28

## (undated) MED ORDER — DIPHENHYDRAMINE HCL 50 MG/ML IJ SOLN
25.0000 mg | Freq: Once | INTRAMUSCULAR | Status: AC | PRN
Start: 2023-01-12 — End: 2023-01-06

## (undated) MED ORDER — CETIRIZINE HCL 10 MG OR TABS
10.00 mg | ORAL_TABLET | Freq: Once | ORAL | Status: AC
Start: 2021-11-19 — End: 2021-11-20

## (undated) MED ORDER — EPINEPHRINE 0.3 MG/0.3ML IJ SOAJ
0.3000 mg | INTRAMUSCULAR | Status: AC | PRN
Start: 2023-03-09 — End: 2023-03-03

## (undated) MED ORDER — RAMIPRIL 2.5 MG OR CAPS
2.5000 mg | ORAL_CAPSULE | Freq: Every day | ORAL | Status: AC
Start: 2021-12-05 — End: ?

## (undated) MED ORDER — IMMUNE GLOBULIN (HUMAN) 20 GM/200ML IJ SOLN
0.50 g/kg | Freq: Once | INTRAMUSCULAR | Status: AC
Start: 2022-12-02 — End: 2022-12-04

## (undated) MED ORDER — CETIRIZINE HCL 10 MG OR TABS
10.0000 mg | ORAL_TABLET | Freq: Once | ORAL | Status: AC
Start: 2023-03-09 — End: 2023-03-03

## (undated) MED ORDER — FAMOTIDINE (PF) 20 MG/2ML IV SOLN
20.0000 mg | Freq: Once | INTRAVENOUS | Status: AC | PRN
Start: 2023-01-12 — End: 2023-01-06

## (undated) MED ORDER — IMMUNE GLOBULIN (HUMAN) 20 GM/200ML IJ SOLN
0.5000 g/kg | Freq: Once | INTRAMUSCULAR | Status: AC
Start: 2021-03-13 — End: 2021-02-28

## (undated) MED ORDER — DEXTROSE 5 % IV SOLN
INTRAVENOUS | Status: AC
Start: 2022-06-25 — End: 2022-06-26

---

## 2019-10-18 IMAGING — MR MRA HEAD WO/W CONTRAST
5 series · 34 of 48 positions shown · IV contrast (20cc prohance)
Comparison: Previous magnetic resonance arthrogram of the brain March 09, 2019.

INDICATION: Right M3 branch middle cerebral artery aneurysm status post endovascular coiling.
TECHNIQUE: Magnetic resonance angiogram of the neck and brain without and with 20 mL ProHance intravenous contrast. Noncontrast axial source and 3-D time-of-flight magnetic resonance angiogram of the brain were performed with coronal 3-D time-of-flight magnetic resonance angiogram of the brain with 3-D CT angiogram images. Post processing software generated rotating 3D MIP images, under concurrent physician supervision.

[Series 7: t1_sag · sagittal · 5.0mm · 0.72mm/px · 4 of 25 slices shown]
[im 1/25]
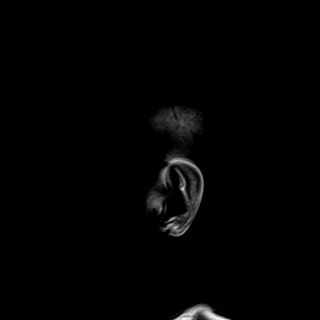
[im 9/25]
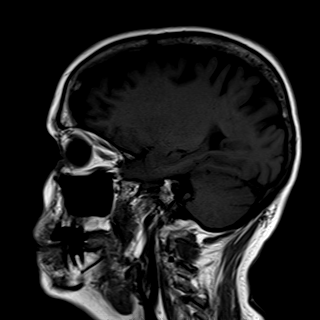
[im 17/25]
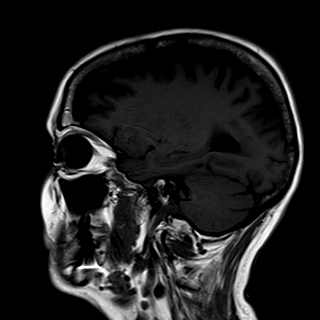
[im 25/25]
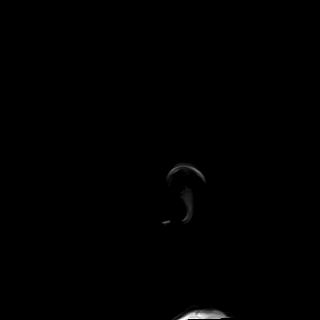

[Series 8: tof_fl3d_tra_large · axial · 0.5mm · 0.41mm/px · z∈[-92,-12]mm · 10 of 172 slices shown]
[im 1/172]
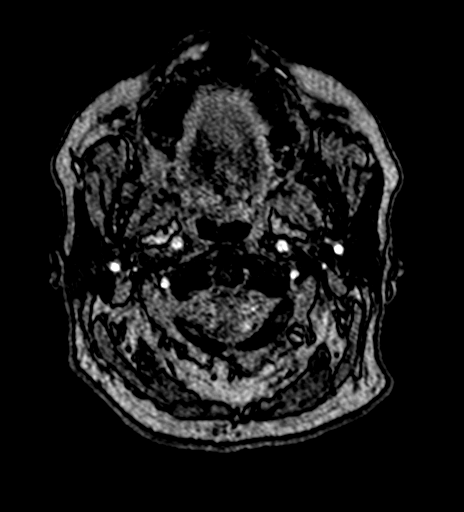
[im 10/172]
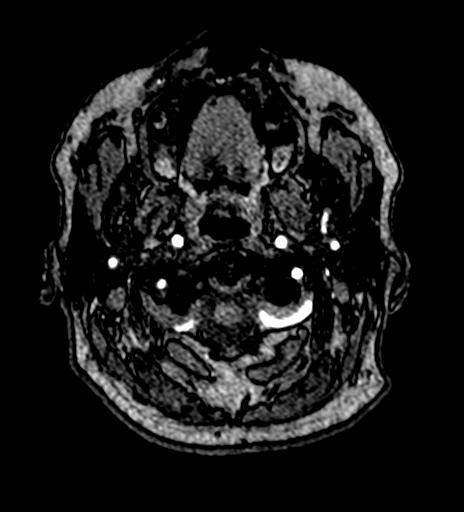
[im 28/172]
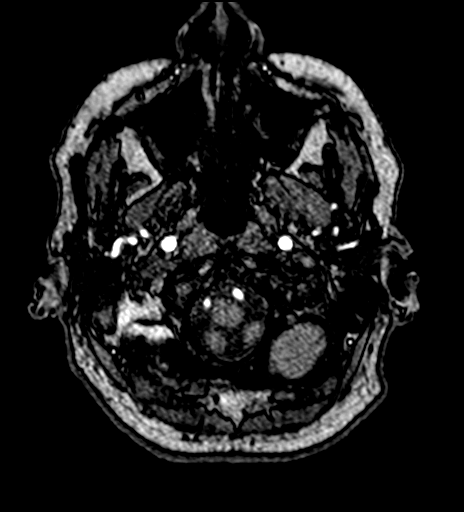
[im 55/172]
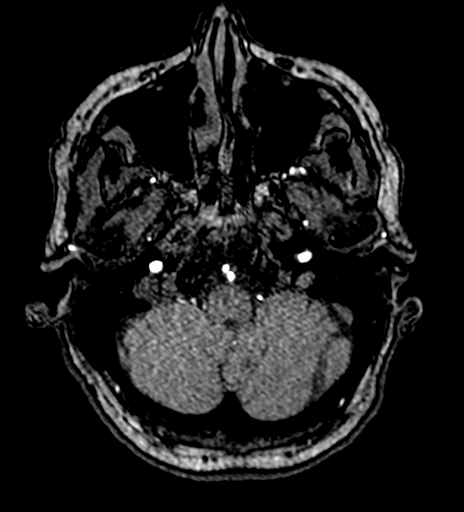
[im 73/172]
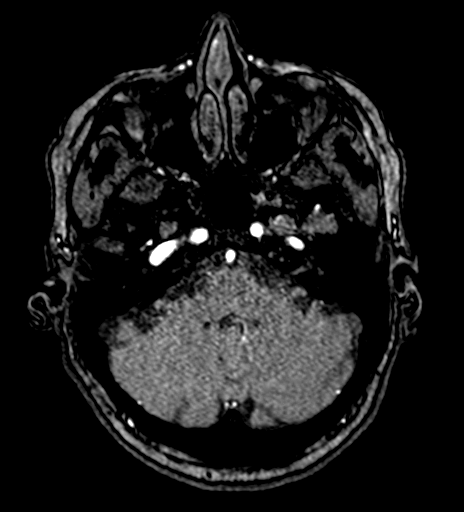
[im 91/172]
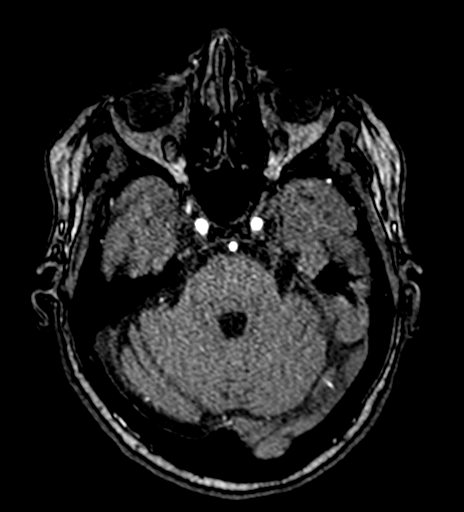
[im 100/172]
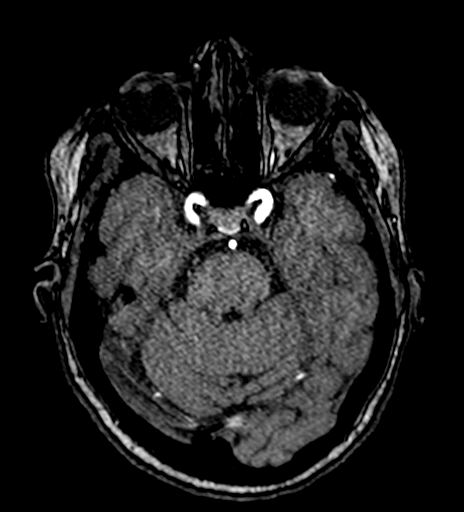
[im 118/172]
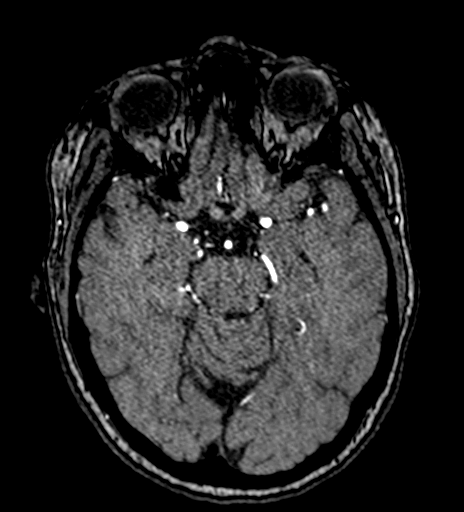
[im 145/172]
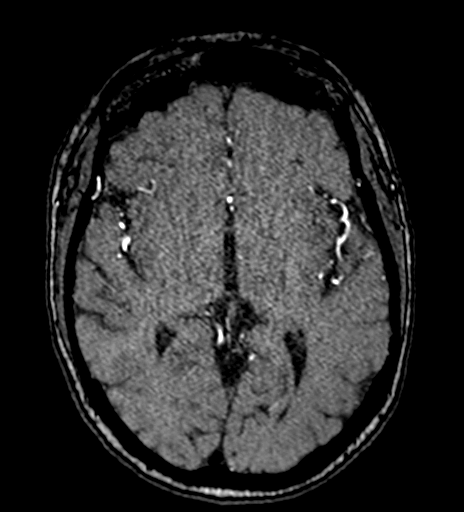
[im 163/172]
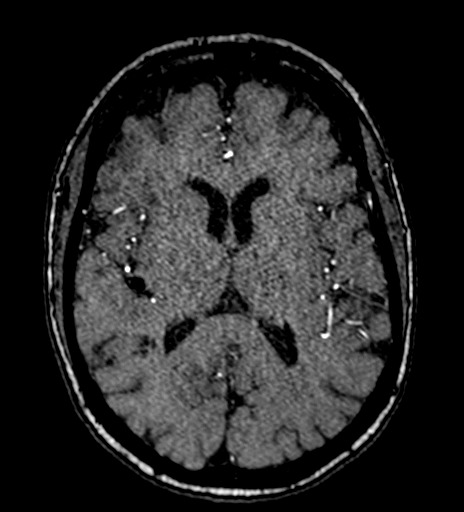

[Series 13: t1_axial fs · axial · 4.0mm · 0.72mm/px · z∈[-104,+46]mm · 4 of 30 slices shown]
[im 1/30]
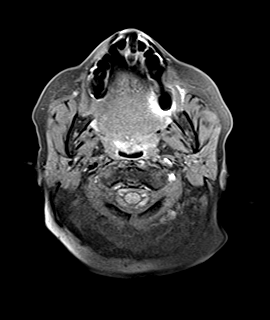
[im 10/30]
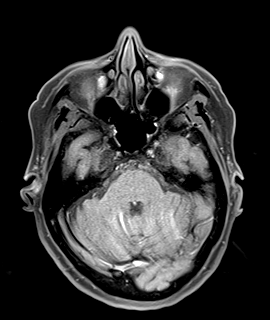
[im 20/30]
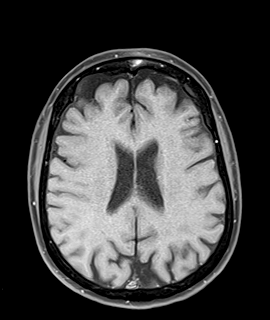
[im 30/30]
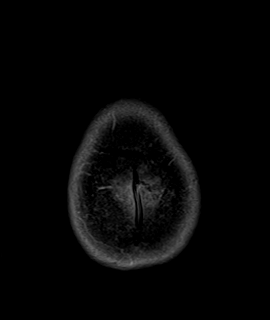

[Series 18: fl_3d_ce_cor_+c_ttc=2.0s_moco-adv_sub · coronal · 0.9mm · 0.85mm/px · 8 of 88 slices shown (1 of 2)]
[im 1/88]
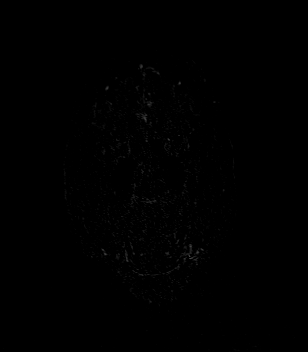
[im 10/88]
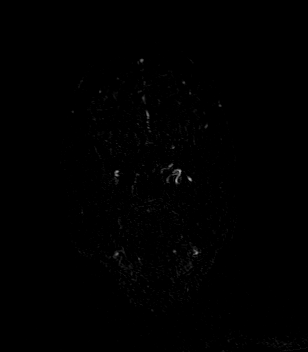
[im 30/88]
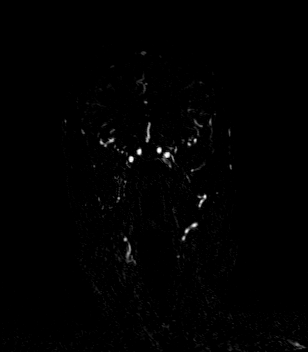
[im 39/88]
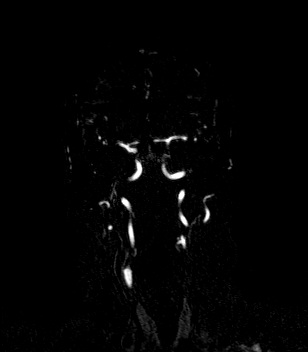
[im 49/88]
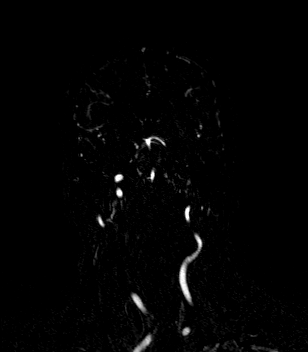
[im 59/88]
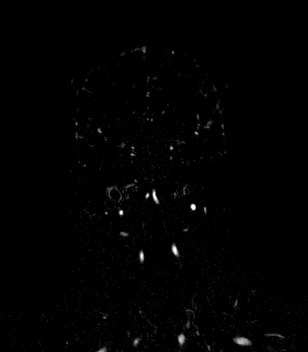
[im 78/88]
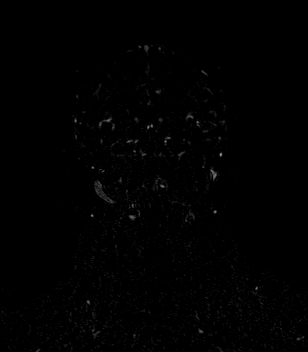
[im 88/88]
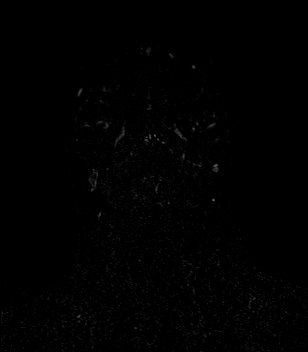

[Series 22: fl_3d_ce_cor_+c_ttc=2.0s_moco-adv_sub · coronal · 0.9mm · 0.85mm/px · 8 of 88 slices shown (2 of 2)]
[im 1/88]
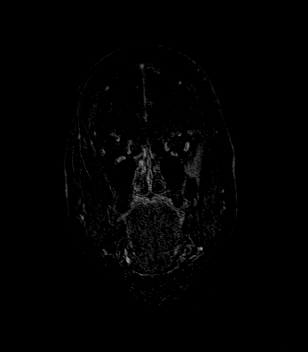
[im 10/88]
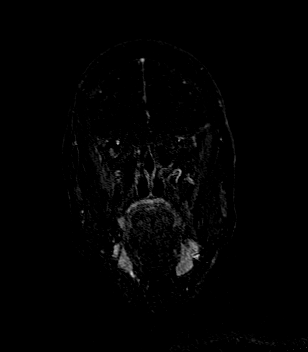
[im 30/88]
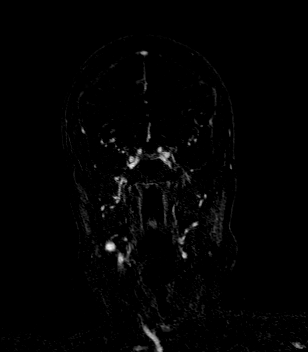
[im 39/88]
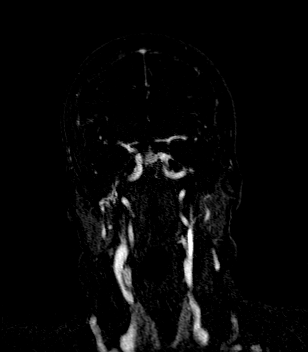
[im 49/88]
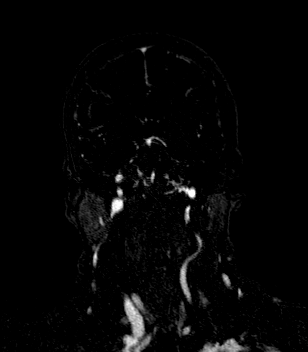
[im 59/88]
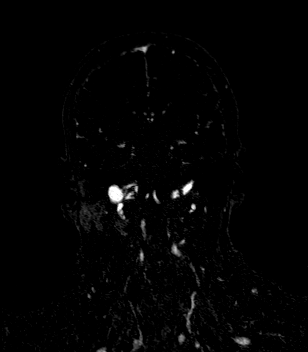
[im 78/88]
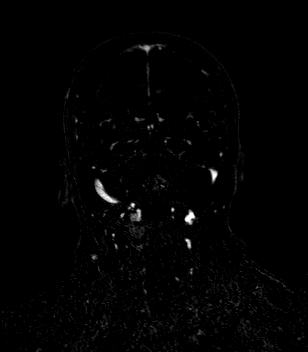
[im 88/88]
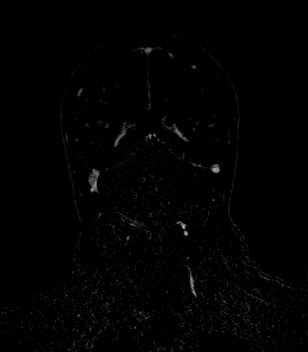

[34 of 48 positions shown; findings below may reference images not displayed]

FINDINGS: The visualized subclavians, common carotids and vertebral arteries are of normal caliber. Both vertebral arteries are of normal caliber joining at the base of the skull to form a normal basilar artery. Both common carotid arteries bifurcate into normal caliber external and cervical internal carotid arteries. Cervical internal carotid arteries continue up into the brain bifurcating into normal caliber anterior middle cerebral arteries. There is a metallic signal misregistration artifact in the right posterior sylvian fissure from previous endovascular coiling of an M3 branch of the middle cerebral artery. There is no evidence to suggest residual aneurysm. No other intracranial aneurysm is seen. No intracranial arterial stenoses or arteriovenous malformation is seen.
IMPRESSION: Status post endovascular coiling of an aneurysm in the right M3 branch of the right middle cerebral artery. No intracranial aneurysm or arteriovenous malformation is seen. No intracranial arterial stenosis is seen.

## 2020-02-11 LAB — HEMOGLOBIN A1C - EXTERNAL
ESTIMATED GLUCOSE: 134 mg/dL
Estimated Average Glucose: 134 mg/dL
HEMOGLOBIN A1C: 6.3 % — ABNORMAL HIGH (ref ?–5.7)
Hemoglobin A1c: 6.3 % — ABNORMAL HIGH (ref <5.7)

## 2020-05-22 DIAGNOSIS — J479 Bronchiectasis, uncomplicated: Secondary | ICD-10-CM | POA: Insufficient documentation

## 2020-05-22 DIAGNOSIS — A310 Pulmonary mycobacterial infection: Secondary | ICD-10-CM | POA: Insufficient documentation

## 2020-05-22 LAB — HEMOGLOBIN A1C - EXTERNAL
ESTIMATED GLUCOSE: 154 mg/dL
Estimated Average Glucose: 154 mg/dL
HEMOGLOBIN A1C: 7 % — ABNORMAL HIGH (ref ?–5.7)
Hemoglobin A1c: 7 % — ABNORMAL HIGH (ref <5.7)

## 2020-05-24 ENCOUNTER — Encounter: Payer: Self-pay | Admitting: Critical Care Medicine

## 2020-05-24 DIAGNOSIS — J479 Bronchiectasis, uncomplicated: Secondary | ICD-10-CM

## 2020-05-24 DIAGNOSIS — A310 Pulmonary mycobacterial infection: Secondary | ICD-10-CM

## 2020-05-31 ENCOUNTER — Telehealth (INDEPENDENT_AMBULATORY_CARE_PROVIDER_SITE_OTHER): Payer: Self-pay

## 2020-05-31 NOTE — Telephone Encounter (Signed)
Pt has been contacted and scheduled for sooner appt.

## 2020-05-31 NOTE — Telephone Encounter (Signed)
Pt called requesting a sooner appt. Per pt, she is currently on no medication for MAI (mycobacterium avium-intracellulare) infection due to reevaluation( tests, etc) needed.  Per pt, referring provider is to send updated referral with Status as STAT. Please note, this agent was not able to secure sooner appt and pt is on waitlist. Routing as high priority per pt request. Pt requesting a call back. Please assist. Thank you.       Ph. [phone]  Okay to l/m.

## 2020-06-06 ENCOUNTER — Encounter (INDEPENDENT_AMBULATORY_CARE_PROVIDER_SITE_OTHER): Payer: Self-pay | Admitting: Pulmonary Medicine

## 2020-06-06 ENCOUNTER — Ambulatory Visit (INDEPENDENT_AMBULATORY_CARE_PROVIDER_SITE_OTHER): Payer: Medicare Other | Admitting: Pulmonary Medicine

## 2020-06-06 VITALS — BP 136/73 | HR 68 | Temp 98.0°F | Ht 62.5 in | Wt 130.0 lb

## 2020-06-06 DIAGNOSIS — J479 Bronchiectasis, uncomplicated: Secondary | ICD-10-CM

## 2020-06-06 DIAGNOSIS — A310 Pulmonary mycobacterial infection: Secondary | ICD-10-CM

## 2020-06-06 MED ORDER — OMEPRAZOLE PO
ORAL | Status: DC
Start: ? — End: 2020-10-16

## 2020-06-06 MED ORDER — RAMIPRIL 2.5 MG OR CAPS
2.5000 mg | ORAL_CAPSULE | Freq: Every day | ORAL | Status: DC
Start: ? — End: 2021-10-08

## 2020-06-06 MED ORDER — MULTIVITAMINS/FL PO
ORAL | Status: DC
Start: ? — End: 2022-03-27

## 2020-06-06 MED ORDER — FLUOXETINE HCL PO
ORAL | Status: DC
Start: ? — End: 2021-04-23

## 2020-06-06 MED ORDER — METFORMIN HCL 1000 MG OR TABS
1000.0000 mg | ORAL_TABLET | Freq: Two times a day (BID) | ORAL | Status: DC
Start: ? — End: 2020-12-07

## 2020-06-06 MED ORDER — ATORVASTATIN CALCIUM 10 MG OR TABS
10.0000 mg | ORAL_TABLET | Freq: Every day | ORAL | Status: DC
Start: ? — End: 2021-11-06

## 2020-06-06 MED ORDER — FLUOXETINE HCL 20 MG OR TABS
20.0000 mg | ORAL_TABLET | Freq: Every day | ORAL | Status: DC
Start: ? — End: 2021-01-16

## 2020-06-06 MED ORDER — ALBUTEROL IN
RESPIRATORY_TRACT | Status: DC
Start: ? — End: 2020-08-11

## 2020-06-06 MED ORDER — MELATONIN ER PO
ORAL | Status: DC
Start: ? — End: 2020-08-29

## 2020-06-06 NOTE — Patient Instructions (Addendum)
Reviewed extensive records of office visits notes, the breathing test (PFT), CT chest and bronchoscopy results.  Please request the CT chest image (on CD disc) to be uploaded to [facility].  Please have blood test done (need fasting before the blood test).  STAT referral to Dr. [name]  F/U as needed.

## 2020-06-06 NOTE — Progress Notes (Signed)
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 Patient was diagnosed with MAC in 2018. She was started triple treatment for 18 months (end on 01/19/18). Repeat CT chest showed improvement. Then sputum was positive for MAC again, she was treated for triple treatment again for 18 months (05/25/18-10/22/19). Then she was on triple treatment again 12/22/19-02/16/20. Then the treatment ws stopped and she had CT, PFTs and bronchoscopy. Her Bronchoscopy was done on 04/28/20, was reported BAL AFB 3+ positive, probe positive for MAI, sensitivities still pending. She moved to [facility] recently. She was referred to Pulmonary [facility] Clinic with Dr. Katheren Shams. Her appointment was in September. She called and wanted an early appointment. She was added to my clinic today after an appointment became available after cancellation. She says she has cough, sometimes with small amount of hemoptysis, fatigue, SOB. However, she was not able to tolerate hypertonic saline. She was not able to produce sputum samples.       Review of Systems:   All ROS performed negative except HPI.     No Known Allergies    Current Outpatient Medications on File Prior to Visit   Medication Sig Dispense Refill    ALBUTEROL IN       atorvastatin (LIPITOR) 10 MG tablet Take 10 mg by mouth daily.      fluoxetine (PROZAC) 20 MG tablet Take 20 mg by mouth daily.      FLUOXETINE HCL PO 30 mg      MELATONIN ER PO       metFORMIN (GLUCOPHAGE) 1000 mg tablet Take 1,000 mg by mouth 2 times daily (with meals).      OMEPRAZOLE PO       Pediatric Multivitamins-Fl (MULTIVITAMINS/FL PO)       ramipril (ALTACE) 2.5 MG capsule Take 2.5  mg by mouth daily.       No current facility-administered medications on file prior to visit.       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    Social History     Socioeconomic History    Marital status: Widowed     Spouse name: Not on file    Number of children: Not on file    Years of education: Not on file    Highest education level: Not on file   Occupational History    Not on file   Tobacco Use    Smoking status: Never Smoker    Smokeless tobacco: Never Used   Substance and Sexual Activity    Alcohol use: Not on file    Drug use: Not on file    Sexual activity: Not on file   Other Topics Concern    Not on file   Social History Narrative    Not on file     Social Determinants of Health     Financial Resource Strain: Not on file   Food Insecurity: Not on file   Transportation Needs: Not on file   Physical Activity:  Not on file   Stress: Not on file   Social Connections: Not on file   Intimate Partner Violence: Not on file   Housing Stability: Not on file       No family history on file.      Physical Exam:    BP 136/73 (BP Location: Left arm, BP Patient Position: Sitting, BP cuff size: Regular)    Pulse 68    Temp 98 F (36.7 C) (Temporal)    Ht 5' 2.5" (1.588 m)    Wt 59 kg (130 lb)    SpO2 96%    BMI 23.40 kg/m      General Appearance: no acute distress, skin warm, dry.   Nose: normal.   Mouth: normal.   Neck: Neck supple.   Heart: normal rate and regular rhythm, no murmurs, clicks, or gallops.   Lungs: scattered crackles, no wheezes, or rhonchi.   Abdomen: BS normal. Abdomen soft, non-tender.   Extremities: no cyanosis, clubbing, or edema.  Neuro: Alert, OX3, no focal deficits      Data:    Laboratory Review:  No results found for: WBC, HGB, HCT, PLT  No results found for: NA, K, CL, BICARB, BUN, CREAT, GLU, [place]  No results found for: PT, PTT, INR  No  results found for: ARTPH, [id number], ARTPO2     Microbiology:  [facility]  02/22/2020 sputum culture with normal respiratory flora. AFB was quantity not sufficient  BAL 04/28/20: AFB 3+ positive, probe positive for MAI, sensitivities still pending. 2+ PMN's, with scant usual resp flora. Negative fungal cultures.        Pulmonary Function Test Review:  04/12/20        Radiology Review:     CT Chest  04/13/20 ([facility])  FINDINGS:   LUNGS/PLEURA: Slightly more prominent tree-in-bud nodularity most prominent in the right upper and middle lobed, as well as a few segments in the superior aspect of the left lower lobe and posterolateral aspect of the left upper lobe. The cavitary lesion in the right peripheral upper lobe is larger measuring 2.7 cm, although may have a slightly less thickened and nodular wall. Bronchiectasis most pronounced in the right upper lobe. Re-identified spiculated lung nodule in the right apex with calcifications is stable in size measuring 1.2 x 0.9 cm compared to prior CT on 12/09/2019. There has been resolution of another prior nodule that was located in the posteromedial aspect of the right upper lobe.   VASCULATURE: Calcified aortic arch. Thrombus cannot be excluded without intravenous contrast.    HILA: No mass or adenopathy.    MEDIASTINUM: Mild lymphadenopathy is stable.   CARDIAC: No enlargement, pericardial thickening, or significant calcification.    AORTA: No aneurysm.    CHEST WALL: No mass or axillary adenopathy.    LIMITED ABDOMEN: Simple cyst in the right lobe of the liver. Limited images of the upper abdomen are unremarkable.    BONES: Mild degenerative changes of the spine. No acute fracture.     CONCLUSION:   1. 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   2. Cavitary lesion in the periphery of the right lung has increased in size measuring 2.7 cm, although may have a slightly less thickened and  nodular wall.   3. Diffuse bronchiectasis most pronounced in the right upper lobe.       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 Patient was diagnosed with MAC in 2018. She was started triple treatment for 18 months (end on 01/19/18). Repeat CT chest showed improvement. Then sputum was positive for MAC again, she was treated for triple treatment again for 18 months (05/25/18-10/22/19). Then she was on triple treatment again 12/22/19-02/16/20. Then the treatment ws stopped and she had CT, PFTs and bronchoscopy. Her Bronchoscopy was done on 04/28/20, was reported BAL AFB 3+ positive, probe positive for MAI, sensitivities still pending. She moved to [facility] recently. She was referred to Pulmonary [facility] Clinic with Dr. Katheren Shams. Her appointment was in September. She called and wanted an early appointment. She was added to my clinic today after an appointment became available after cancellation. She says she has cough, sometimes with small amount of hemoptysis, fatigue, SOB. However, she was not able to tolerate hypertonic saline. She was not able to produce sputum samples.       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 She was treated with triple antibiotics for 18 months twice, since diagnosis in 2018 (?/2018-01/19/2018, 05/25/18-10/22/19). She was started for the third time triple antibiotics treatment for two months (12/22/19-02/16/20). That was on hold and she underwent more diagnostic studies. It was reported BAL AFB 3+ positive, probe positive for MAI. With the treatment failure after prolonged courses, she may need to try either IV  antibiotics or other new treatment including clinical trials. Will refer her to NTM clinic.  -     Pulmonary and Sleep Medicine Services  -     CBC w/ Diff Lavender; Future  -     Comprehensive Metabolic Panel; Future  -     Liver Panel, Blood Green Plasma Separator Tube; Future  -     Pulmonary and Sleep Medicine Services; Future        ICD-10-CM ICD-9-CM   1. 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 MAI (mycobacterium avium-intracellulare) infection (CMS-HCC)  A31.0 031.0   3. Acquired bronchiectasis (CMS-HCC)  J47.9 494.0       Orders:  Orders Placed This Encounter   Procedures    CBC w/ Diff Lavender    Comprehensive Metabolic Panel    Liver Panel, Blood Green Plasma Separator Tube    Pulmonary and Sleep Medicine Services       Follow Up: Return if symptoms worsen or fail to improve.  Reviewed extensive records of office visits notes, the breathing test (PFT), CT chest and bronchoscopy results.  Please request the CT chest image (on CD disc) to be uploaded to [facility].  Please have blood test done (need fasting before the blood test).  STAT referral to Dr. Katheren Shams  F/U as needed    I personally spent  total of 60 minutes in face-to-face and non-face-to-face activities related to the patients visit today, excluding any separately reportable services/procedures.       Jonette Mate, MD  [facility]  [place] [address]

## 2020-06-12 ENCOUNTER — Telehealth (INDEPENDENT_AMBULATORY_CARE_PROVIDER_SITE_OTHER): Payer: Self-pay | Admitting: Critical Care Medicine

## 2020-06-12 NOTE — Telephone Encounter (Signed)
Patient came in clinic and dropped off CD and letter for Dr Fransico Michael. CD and letter will be placed in Jessica's box.

## 2020-06-13 ENCOUNTER — Other Ambulatory Visit: Payer: Self-pay

## 2020-06-22 ENCOUNTER — Encounter (INDEPENDENT_AMBULATORY_CARE_PROVIDER_SITE_OTHER): Payer: Self-pay

## 2020-07-17 ENCOUNTER — Other Ambulatory Visit: Payer: Medicare Other | Attending: Pulmonary Medicine

## 2020-07-17 DIAGNOSIS — J479 Bronchiectasis, uncomplicated: Secondary | ICD-10-CM | POA: Insufficient documentation

## 2020-07-17 DIAGNOSIS — A310 Pulmonary mycobacterial infection: Secondary | ICD-10-CM | POA: Insufficient documentation

## 2020-07-17 LAB — COMPREHENSIVE METABOLIC PANEL, BLOOD
ALT (SGPT): 16 U/L (ref 0–33)
AST (SGOT): 20 U/L (ref 0–32)
Albumin: 4.3 g/dL (ref 3.5–5.2)
Alkaline Phos: 63 U/L (ref 40–130)
Anion Gap: 14 mmol/L (ref 7–15)
BUN: 15 mg/dL (ref 8–23)
Bicarbonate: 25 mmol/L (ref 22–29)
Bilirubin, Tot: 0.56 mg/dL (ref <1.2)
Calcium: 10 mg/dL (ref 8.5–10.6)
Chloride: 101 mmol/L (ref 98–107)
Creatinine: 0.59 mg/dL (ref 0.51–0.95)
GFR: >60 mL/min
Glucose: 127 mg/dL — ABNORMAL HIGH (ref 70–99)
Potassium: 3.9 mmol/L (ref 3.5–5.1)
Sodium: 140 mmol/L (ref 136–145)
Total Protein: 6.9 g/dL (ref 6.0–8.0)
eGFR Based on CKD-EPI 2021 Equation: >60 mL/min

## 2020-07-17 LAB — CBC WITH DIFF, BLOOD
ANC-Automated: 5.7 10*3/uL (ref 1.6–7.0)
Abs Basophils: 0.1 10*3/uL — ABNORMAL HIGH (ref <0.1)
Abs Eosinophils: 0 10*3/uL (ref 0.0–0.5)
Abs Lymphs: 1.3 10*3/uL (ref 0.8–3.1)
Abs Monos: 0.6 10*3/uL (ref 0.2–0.8)
Basophils: 1 %
Eosinophils: 1 %
Hct: 37.9 % (ref 34.0–45.0)
Hgb: 12.1 gm/dL (ref 11.2–15.7)
Imm Gran %: 1 % — ABNORMAL HIGH (ref <1)
Imm Gran Abs: 0.1 10*3/uL — ABNORMAL HIGH (ref <0.1)
Lymphocytes: 17 %
MCH: 28.5 pg (ref 26.0–32.0)
MCHC: 31.9 g/dL — ABNORMAL LOW (ref 32.0–36.0)
MCV: 89.2 um3 (ref 79.0–95.0)
MPV: 10.6 fL (ref 9.4–12.4)
Monocytes: 8 %
Plt Count: 259 10*3/uL (ref 140–370)
RBC: 4.25 10*6/uL (ref 3.90–5.20)
RDW: 14.9 % — ABNORMAL HIGH (ref 12.0–14.0)
Segs: 73 %
WBC: 7.8 10*3/uL (ref 4.0–10.0)

## 2020-07-17 LAB — BILIRUBIN, DIR BLOOD: Bilirubin, Dir: <0.2 mg/dL (ref <0.2)

## 2020-07-24 ENCOUNTER — Ambulatory Visit (INDEPENDENT_AMBULATORY_CARE_PROVIDER_SITE_OTHER): Payer: Medicare Other | Admitting: Critical Care Medicine

## 2020-07-24 ENCOUNTER — Encounter (INDEPENDENT_AMBULATORY_CARE_PROVIDER_SITE_OTHER): Payer: Self-pay | Admitting: Critical Care Medicine

## 2020-07-24 ENCOUNTER — Encounter (INDEPENDENT_AMBULATORY_CARE_PROVIDER_SITE_OTHER): Payer: Self-pay | Admitting: Nurse Practitioner

## 2020-07-24 ENCOUNTER — Telehealth (INDEPENDENT_AMBULATORY_CARE_PROVIDER_SITE_OTHER): Payer: Self-pay

## 2020-07-24 ENCOUNTER — Telehealth (INDEPENDENT_AMBULATORY_CARE_PROVIDER_SITE_OTHER): Payer: Self-pay | Admitting: Critical Care Medicine

## 2020-07-24 VITALS — BP 148/72 | HR 70 | Temp 97.2°F | Resp 18 | Ht 62.5 in | Wt 128.0 lb

## 2020-07-24 DIAGNOSIS — A310 Pulmonary mycobacterial infection: Secondary | ICD-10-CM

## 2020-07-24 DIAGNOSIS — J479 Bronchiectasis, uncomplicated: Secondary | ICD-10-CM

## 2020-07-24 DIAGNOSIS — J984 Other disorders of lung: Secondary | ICD-10-CM

## 2020-07-24 DIAGNOSIS — I499 Cardiac arrhythmia, unspecified: Secondary | ICD-10-CM

## 2020-07-24 HISTORY — DX: Other disorders of lung: J98.4

## 2020-07-24 MED ORDER — ETHAMBUTOL HCL 400 MG OR TABS
800.0000 mg | ORAL_TABLET | Freq: Every day | ORAL | 11 refills | Status: DC
Start: 2020-07-24 — End: 2021-07-24

## 2020-07-24 MED ORDER — SODIUM CHLORIDE (INHALANT) 7 % IN NEBU
4.0000 mL | INHALATION_SOLUTION | Freq: Two times a day (BID) | RESPIRATORY_TRACT | 11 refills | Status: DC
Start: 2020-07-24 — End: 2021-10-01

## 2020-07-24 MED ORDER — DEXTROSE 5 % IV SOLN
600.0000 mg | INTRAVENOUS | Status: DC
Start: 2020-07-24 — End: 2020-10-16

## 2020-07-24 MED ORDER — SODIUM CHLORIDE (INHALANT) 7 % IN NEBU
4.0000 mL | INHALATION_SOLUTION | Freq: Two times a day (BID) | RESPIRATORY_TRACT | Status: DC
Start: 2020-04-18 — End: 2020-12-07

## 2020-07-24 MED ORDER — AZITHROMYCIN 250 MG OR TABS
250.0000 mg | ORAL_TABLET | Freq: Every day | ORAL | 11 refills | Status: DC
Start: 2020-07-24 — End: 2021-07-24

## 2020-07-24 MED ORDER — RIFAMPIN 300 MG OR CAPS
600.0000 mg | ORAL_CAPSULE | Freq: Every day | ORAL | 11 refills | Status: DC
Start: 2020-07-24 — End: 2021-07-24

## 2020-07-24 NOTE — Telephone Encounter (Signed)
Please schedule Nebulizer with saline airway teaching

## 2020-07-24 NOTE — Patient Instructions (Addendum)
-  sign up to the [facility] patient access. We will message you frequently through this portal  -schedule Sputum Inductions ASAP (would like to have done prior to starting MAC Therapy) call [phone] to schedule  -please have a Pulmonary Function Test call [phone] to schedule  -we have placed a consult to [facility]. Pls schedule an appointment  -we have placed a consult to Gastroenterology Clinic. Pls schedule an appointment  -we have placed a referral consult to the GI Clinic  - continue to do physical activity with walking-include uphill segments  - continue to do nebulized saline twice a day. We will send a prescription to your pharmacy [facility]  -we will schedule you with our respiratory therapist to go over nebulization and cleaning of the supplies. Please bring your nebulizer and saline to this appointment.  -follow up with my nurse Practioner in 5-6 weeks Harrie Foreman NP at [facility] and me at the [facility] site in 2 months  -please review this website: [email or web address]  - Also review this website and sign up to the newsletter: [email or web address].za.   - In the pamphlet we provided to you today, pls pay special attention to the chapter, "Prevention and Reducing Exposure".   - we will obtain the drug susceptibility test results from [facility].     Instructions starting MAC Therapy:  -we will set up starting IV antibiotic (Amikacin) home infusion -3 months of therapy  -start Antibiotic regimen daily therapy one antibiotic at a time separated by 5 days.  Azithromycin 265m daily-take for 5 days, then add Ethambutol ---- take for 5 days then add Rifampin, at which point you should be on 3 medications.     Schedule a PICC (catheter) placement approx. 3 weeks from today. You would start the Amikacin on that date.     Monitoring during MAC Therapy  -you will have your blood drawn twice a week during IV home antibiotic infusion.   -you will need to submit  sputums 3 every month  -Have an EKG every 3-4 months  -have a hearing test Audiogram every 2 weeks  -[place] Color Discrimination Test--please google this test and test each eye twice a week. If any changes notify [place]

## 2020-07-24 NOTE — Telephone Encounter (Signed)
MACRN: pt will start IV home infusion and MAC Antibx therapy

## 2020-07-24 NOTE — Telephone Encounter (Signed)
Pt returning today's call from [place], Tiffany Chen. Please review and assist.  Thank you   Ph [phone]

## 2020-07-24 NOTE — Telephone Encounter (Signed)
Pulmonary Medicine and Sleep Center RT Note:      Reached out to patient to see when would be the best time to do a nebulizer and airway clearance teaching recommended by Dr. Fransico Michael. Will try to call patient again later today since patient was not able to talk at the moment.       Please assist patient if she calls back to schedule the onsite teaching.       Advised patient to bring all their respiratory medications and equipment to the teaching.  The teaching will be an hour long to answer all questions.     If you are able to schedule, please schedule patient anytime when I don't have any PFT patients for an hour on the Nurse's template for the respiratory airway clearance teaching. Preferably in the afternoons.      Please let me know the time and make sure to block my PFT schedule as well.       Thank you,      Future Appointments   Date Time Provider Department Center   08/29/2020  2:00 PM Geraldine Contras, Sunday Shams, NP [facility]   10/18/2020 10:30 AM [name], MD [facility]             Conley Rolls Thi My Akaiya Touchette; RCP, RT, BSRT  Pulmonary Medicine and Sleep Center  Office:  [phone]

## 2020-07-24 NOTE — Progress Notes (Signed)
Pulmonary Clinic Note    This patient was referred by Jonette Mate, MD  [address]  [place],  [place] [address]  Chief Complaint:  Tiffany Chen is a 75 year old female who is in the clinic today with a chief complaint of "New Patient and Respiratory Problem  ."    History of Present Illness:  Establishing care for cavitary MAC lung infection.     PMH of bronchiectasis, cavitary MAC lung infection, ruptured diverticulitis S/P colostomy in 2009, diabetes, ICH S/P embolization.     MAC history- treatment courses were all azithromycin 522m, Rifampin 603m, ethambutol 160030mhree times weekly:   - 2018- 01/19/2018  - 05/25/2018- 10/22/2019  - 12/22/2019- 02/16/2020.     She was being treated by Dr. ChoCarola Frostr MAC lung infection. She reports that daily dosing of medication and adding a 4th antibiotic were not discussed before 2022, despite her disease being cavitary.     In late 2021, CT scan showed enlarging cavity. December 2021 a bronch was performed but no MAC growth. Her doctor restarted azithro/Rifampin/ethmabutol based on imaging.   Feb 2022 she saw [name] stopped the antibiotics based on the lack of microbiologic data. Performed repeat bronch 04/28/2020 which has smear AFB 3+ and culture MAC (drug susc pending)     Over the last several months her symptoms have consisted of significant daily coughing, causing night time awakening most nights, night sweats every night, and weight loss >30lbs. Also significant fatigue. Dr. AshCecile Hearingescribed nebulized saline but she did not start it.   She tried to walk several miles each day despite her fatigue.     Significant heartburn if she lays shortly after eating.     ROS as above otherwise negative.     Miscellaneous:   Never smoker.   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   Her husband died after contracting COVID in the hospital.   [place]   Her brother is Vegan.   No hit tubs, no  swimming pools.     Patient Active Problem List   Diagnosis    Acquired tracheobronchomegaly with bronchiectasis (CMS-HCC)    MAI (mycobacterium avium-intracellulare) (CMS-HCC)    Cavitary lesion of lung     Past Medical History:   Diagnosis Date    DM (diabetes mellitus) (CMS-HCC)     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)      No past surgical history on file.  No Known Allergies  Current Outpatient Medications on File Prior to Visit   Medication Sig Dispense Refill    ALBUTEROL IN       atorvastatin (LIPITOR) 10 MG tablet Take 10 mg by mouth daily.      fluoxetine (PROZAC) 20 MG tablet Take 20 mg by mouth daily.      FLUOXETINE HCL PO 30 mg      MELATONIN ER PO       metFORMIN (GLUCOPHAGE) 1000 mg tablet Take 1,000 mg by mouth 2 times daily (with meals).      OMEPRAZOLE PO       Pediatric Multivitamins-Fl (MULTIVITAMINS/FL PO)       ramipril (ALTACE) 2.5 MG capsule Take 2.5 mg by mouth daily.       No current facility-administered medications on file prior to visit.     No family history on file.  Social History     Socioeconomic  History    Marital status: Widowed   Tobacco Use    Smoking status: Never Smoker    Smokeless tobacco: Never Used       Examination:  Vitals:    07/24/20 1235   BP: 148/72   BP Location: Left arm   BP Patient Position: Sitting   BP cuff size: Regular   Pulse: 70   Resp: 18   Temp: 97.2 F (36.2 C)   TempSrc: Temporal   SpO2: 96%   Weight: 58.1 kg (128 lb)   Height: 5' 2.5" (1.588 m)     General: The patient is well appearing, sitting comfortably on the examination table with no evidence of respiratory distress.  Heart: regular rate and rhythm, no murmurs rubs or gallops, normally split S2, no accentuation of P2  Lungs: clear to ausculation and percussion, no wheezes, normal I:E ratio.   Abdomen: soft, non-tender, normal active bowel sounds. Colostomy in place, clean.   Extremities: no clubbing, cyanosis or edema.     Laboratory Review:  Lab  Results   Component Value Date    WBC 7.8 07/17/2020    HGB 12.1 07/17/2020    HCT 37.9 07/17/2020    PLT 259 07/17/2020     Lab Results   Component Value Date    NA 140 07/17/2020    K 3.9 07/17/2020    CL 101 07/17/2020    BICARB 25 07/17/2020    BUN 15 07/17/2020    CREAT 0.59 07/17/2020    GLU 127 (H) 07/17/2020    [place] 10.0 07/17/2020        Pulmonary Function Test Review:  04/28/2020 (care everywhere): noraml spito, normal volumes, mildly reduced DLCO    Radiology Review:  CT chest 04/13/2020:   CONCLUSION:   1. 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   2. Cavitary lesion in the periphery of the right lung has increased in size measuring 2.7 cm, although may have a slightly less thickened and nodular wall.   3. Diffuse bronchiectasis most pronounced in the right upper lobe.     Microbiology  BAL 04/28/2020 (from Dr. Cecile Hearing): AFB smear 3+, culture MAC (drug susceptibly testing sent to [facility]- pending)    Impression/Plan:    Problem List:   - cavitary MAC lung infection  - bronchiectasis  - IgG deficiency.     Discussion/plan 9and see patient instructions):   - In light of the severe cavitary nature her MAC lung infection and the severity of her symptoms, strongly recommended daily azithro/rifampin/ethambutol PLUS Intravenous amikacin. Also will need non invasive methods to follow microbiologically. Sp will order sputum inductions.   - Encouraged patient to start twice daily nebulized saline with [place]. Will scheduled RT teaching session.   - would consider right upper lobectomy  - plan on repeating CT scan 3 months into therapy.     Patient Instructions  -sign up to the [facility] patient access. We will message you frequently through this portal  -schedule Sputum Inductions ASAP (would like to have done prior to starting MAC Therapy) call [phone] to schedule  -please have a Pulmonary Function Test call  [phone] to schedule  -we have placed a consult to Immunology [facility]. Pls schedule an appointment  -we have placed a consult to Gastroenterology Clinic. Pls schedule an appointment  -we have placed a referral consult to the GI Clinic  - continue to do physical activity with walking-include  uphill segments  - continue to do nebulized saline twice a day. We will send a prescription to your pharmacy [facility]  -we will schedule you with our respiratory therapist to go over nebulization and cleaning of the supplies. Please bring your nebulizer and saline to this appointment.  -follow up with my nurse Practioner in 5-6 weeks Harrie Foreman NP at [facility] and me at the [facility] site in 2 months  -please review this website: [email or web address]-[email or web address]  - Also review this website and sign up to the newsletter: FedBond.co.za.   - In the pamphlet we provided to you today, pls pay special attention to the chapter, "Prevention and Reducing Exposure".   - we will obtain the drug susceptibility test results from [facility].     Instructions starting MAC Therapy:  -we will set up starting IV antibiotic (Amikacin) home infusion -3 months of therapy  -start Antibiotic regimen daily therapy one antibiotic at a time separated by 5 days.  Azithromycin 279m daily-take for 5 days, then add Ethambutol ---- take for 5 days then add Rifampin, at which point you should be on 3 medications.     Schedule a PICC (catheter) placement approx 3 weeks from today. You would start the Amikacin on that date.     Monitoring during MAC Therapy  -you will have your blood drawn twice a week during IV home antibiotic infusion.   -you will need to submit sputums 3 every month  -Have an EKG every 3-4 months  -have a hearing test Audiogram every 2 weeks  -Ishihara Color Discrimination Test--please google this test and test each eye twice a week. If any changes notify uKoreaimmediately    Total time spent 971m:   -  with patient, examining, history taking, planning management, counseling  - reviewing images  - records review  - interpretation of results  - documentation    Orders:  No orders of the defined types were placed in this encounter.    No orders of the defined types were placed in this encounter.    No orders of the defined types were placed in this encounter.

## 2020-07-25 ENCOUNTER — Telehealth (HOSPITAL_BASED_OUTPATIENT_CLINIC_OR_DEPARTMENT_OTHER): Payer: Self-pay

## 2020-07-25 ENCOUNTER — Encounter (INDEPENDENT_AMBULATORY_CARE_PROVIDER_SITE_OTHER): Payer: Self-pay

## 2020-07-25 ENCOUNTER — Telehealth (INDEPENDENT_AMBULATORY_CARE_PROVIDER_SITE_OTHER): Payer: Self-pay | Admitting: Critical Care Medicine

## 2020-07-25 DIAGNOSIS — A319 Mycobacterial infection, unspecified: Secondary | ICD-10-CM

## 2020-07-25 NOTE — Telephone Encounter (Signed)
Alecia Lemming from [facility] says they received this order but it didn't the doctor's signature and is requesting it be re-sent. Please assist. Thank you    Ph. [phone]  Fx. [phone]

## 2020-07-25 NOTE — Telephone Encounter (Signed)
Routing to RN

## 2020-07-25 NOTE — Telephone Encounter (Signed)
[name] from Tiffany Chen is calling in regards to an order. Per [name] called Nurse Tiffany Chen via cell phone but no answer. [name] is requesting a call from Nurse Tiffany Chen in regards to services  . Please assist. Thank you       Best Call Number: [phone]    Best Call Back: anytime     Is it ok to leave a message? Yes, ok to leave a detailed msg.

## 2020-07-25 NOTE — Telephone Encounter (Signed)
MACRN_Start IV Home Infusion  Home Health order faxed to [facility]  [phone]  fax-[phone]

## 2020-07-25 NOTE — Telephone Encounter (Signed)
RT spoke with pt yesterday, see encounter

## 2020-07-25 NOTE — Addendum Note (Signed)
Addended by: Royston Bake on: 07/25/2020 11:27 AM     Modules accepted: Orders

## 2020-07-25 NOTE — Telephone Encounter (Signed)
MACRN: Coordination of care:    Plan of care: Start MAC [place]      Patient Instructions  -sign up to the [place] patient access. We will message you frequently through this portal  -schedule Sputum Inductions ASAP (would like to have done prior to starting MAC Therapy) call [phone] to schedule  -please have a Pulmonary Function Test call [phone] to schedule  -we have placed a consult to Immunology [facility]. Pls schedule an appointment  -we have placed a consult to Gastroenterology Clinic. Pls schedule an appointment  -we have placed a referral consult to the GI Clinic  - continue to do physical activity with walking-include uphill segments  - continue to do nebulized saline twice a day. We will send a prescription to your pharmacy [facility]  -we will schedule you with our respiratory therapist to go over nebulization and cleaning of the supplies. Please bring your nebulizer and saline to this appointment.  -follow up with my nurse Practioner in 5-6 weeks Harrie Foreman NP at [facility] and me at the [facility] site in 2 months  -please review this website: [email or web address]-[email or web address]  - Also review this website and sign up to the newsletter: FedBond.co.za.   - In the pamphlet we provided to you today, pls pay special attention to the chapter, "Prevention and Reducing Exposure".   - we will obtain the drug susceptibility test results from [facility].     Instructions starting MAC Therapy:  -we will set up starting IV antibiotic (Amikacin) home infusion -3 months of therapy  -start Antibiotic regimen daily therapy one antibiotic at a time separated by 5 days.  Azithromycin 255m daily-take for 5 days, then add Ethambutol ---- take for 5 days then add Rifampin, at which point you should be on 3 medications.     Schedule a PICC (catheter) placement approx 3 weeks from today. You would start the Amikacin on that date.     Monitoring during MAC Therapy  -you will have your  blood drawn twice a week during IV home antibiotic infusion.   -you will need to submit sputums 3 every month  -Have an EKG every 3-4 months  -have a hearing test Audiogram every 2 weeks  -Ishihara Color Discrimination Test--please google this test and test each eye twice a week. If any changes notify uKoreaimmediately

## 2020-07-26 ENCOUNTER — Encounter (INDEPENDENT_AMBULATORY_CARE_PROVIDER_SITE_OTHER): Payer: Self-pay | Admitting: Hospital

## 2020-07-26 NOTE — Telephone Encounter (Signed)
Clarisse Gouge from [facility] is calling back requesting to speak to Loris. I was unable to connect her to Candace. She would like a f/u call regarding a referral she received from [place]. She said she needs the order with the duration and any progress notes and labs that are available.     CB# [phone]  Fax# [phone]

## 2020-07-27 ENCOUNTER — Ambulatory Visit (INDEPENDENT_AMBULATORY_CARE_PROVIDER_SITE_OTHER): Payer: Medicare Other

## 2020-07-27 ENCOUNTER — Telehealth (INDEPENDENT_AMBULATORY_CARE_PROVIDER_SITE_OTHER): Payer: Self-pay

## 2020-07-27 ENCOUNTER — Ambulatory Visit (INDEPENDENT_AMBULATORY_CARE_PROVIDER_SITE_OTHER): Payer: Medicare Other | Admitting: Allergy

## 2020-07-27 ENCOUNTER — Other Ambulatory Visit (HOSPITAL_BASED_OUTPATIENT_CLINIC_OR_DEPARTMENT_OTHER): Payer: Self-pay | Admitting: Critical Care Medicine

## 2020-07-27 ENCOUNTER — Encounter (HOSPITAL_COMMUNITY): Payer: Self-pay | Admitting: Hospital

## 2020-07-27 DIAGNOSIS — A319 Mycobacterial infection, unspecified: Secondary | ICD-10-CM

## 2020-07-27 DIAGNOSIS — A310 Pulmonary mycobacterial infection: Secondary | ICD-10-CM

## 2020-07-27 NOTE — Telephone Encounter (Addendum)
MACRN:  IV Medication: [facility]   follow up call to [facility]  Confirmed can provide IV medication  Faxed progress note indicating length of IV tx-3 months,  And current labs.     Home health Nursing services: [facility]:  Follow up call to confirm can provide nursing services-will CB

## 2020-07-27 NOTE — Telephone Encounter (Signed)
Patient is calling to schedule a consultation. Referring provider: Katheren Shams, MD Pulmonary and Sleep medicine.    Question Answer Comment   Indications: patient with significant GERD and cavitary MAC lung infection. Pls consider evalaution with EGD/pH study    Urgency: Routine (Within 4 WKS or at Pt Convenience)    Appointment For: General GI    Associated Diagnoses     ICD-10-CM ICD-9-CM   MAI (mycobacterium avium-intracellulare) (CMS-HCC) - Primary     A31.0 031.0   MAI (mycobacterium avium-intracellulare) infection (CMS-HCC)     A31.0 031.0   Acquired bronchiectasis (CMS-HCC)     J47.9 494.0   Cavitary lesion of lung     J98.4 518.89     Please review and advise for scheduling. Thank you

## 2020-07-27 NOTE — Telephone Encounter (Signed)
Pt is calling to speak to RN Candace. Pt stats that she is confused as to the instructions she was given regarding completing blood labs and schedule an appt with pulm. Please review and assist. Thank you.    Ph. [phone]

## 2020-07-27 NOTE — Telephone Encounter (Signed)
MACRN  Faxed  HH referral for nursing services IV Medication Infusion to [facility]    [facility] unable to take case)

## 2020-07-27 NOTE — Telephone Encounter (Signed)
Patient's brother returning missed call for scheduling. No DPOA or DPR on file, informed that cannot assist with scheduling or discuss information from sister until forms are filled out or patient is present to give verbal authorization. Patient's brother understands and will callback with patient on the line to give authorization.

## 2020-07-27 NOTE — Interdisciplinary (Signed)
Pulmonary Medicine and Sleep Center RT Note:     Tiffany Chen   Date of Birth: 1945/07/24  MRN [id number]      Patient with MAI and Acquired Bronchiectasis referred by Dr. Fransico Michael to instruct on the use of 7% Sodium Chloride NaCl Hypertonic Saline Nebulizer treatment (21ml BID), Albuterol (2 puffs Q4H PRN), and different ways of airway clearance methods and devices correctly and effectively.  Patient was accompanied by her brother, Tiffany Chen, who will help with her breathing treatments.     Patient brought in her nebulizer set up, sodium chloride solution, and albuterol MDI to today's visit.  Patient stated she got her nebulizer the end of April, but only used it a few times and stopped.  At that time, she hasn't had her 7% NaCl yet so she had to make her own saline solution and that was cumbersome.  The Albuterol MDI she brought in was expired so she will ask the doctor to give her another Rx for it.       Patient was asked to explain on how she does her treatments, then suggestions were given to impove on patient's techniques and methods to make it more productive and effective for the patient.  Brought in demo [place] and acapella and showed patient how to use it with and without the nebulizer if she needs it in the future.     Patient was instructed to do the Albuterol MDI treatment (first, if needed), 7% Sodium Chloride hypertonic saline nebulizer treatment and airway clearance CPT therapy two times a day or more as needed, followed by 3-4 huff coughs to clear airway secretions. If patient needs the [place] in the future, suggested patient to use the aerobika device in-line with her nebulizer treatment, more convenient.      Re-instructions on how to use the Albuterol MDI properly and effectively were given.     Patient was able to complete a NaCl nebulizer treatment in clinic today.     How to clean, disinfect, and store devices and equipment were discussed in details. Patient was given a new nebulizer set  up so she can have a back up set to switch back and forth from her current nebulizer so it can last longer.     Spent 80 minutes in person discussing, teaching, and demonstrating on the use of the devices.  Brochures and pamphlet on [place] and nebulizer were given to enhance education.  A list of respiratory medication and treatments and the recommended order it should be used was also given to patient. Patient acknowledged she understood the therapy, content with today's teachng and had no further questions.       Future Appointments   Date Time Provider Department Center   08/29/2020  2:00 PM Geraldine Contras, Sunday Shams, NP [facility] Pulm Gen MOS   09/07/2020  2:30 PM Malachy Moan, MD [facility] Allergy [facility]   10/18/2020 10:30 AM [name], MD [facility] Pulm Gen MOS       --------      Kandice Moos My Cletus Paris; RCP, RRT, BSRT   Pulmonary Medicine and Sleep Center   Office:  [phone]

## 2020-07-27 NOTE — Telephone Encounter (Signed)
Tiffany Chen, from [facility], is calling to let RN Candy know that she did receive all documents that was faxed over and that they will stay in touch in regards to pt's PICC appt. She also mentioned that [facility] does not administer first doses and that the pt has been referred to [facility], where they did accept the pt. Tiffany Chen is asking if it is OK to go through [facility] instead? Please advise, thank you.    Ph#: [phone] (Tiffany Chen)

## 2020-07-28 ENCOUNTER — Encounter (HOSPITAL_BASED_OUTPATIENT_CLINIC_OR_DEPARTMENT_OTHER): Payer: Self-pay

## 2020-07-28 ENCOUNTER — Other Ambulatory Visit (HOSPITAL_BASED_OUTPATIENT_CLINIC_OR_DEPARTMENT_OTHER): Payer: Self-pay | Admitting: Physician Assistant

## 2020-07-28 LAB — EMMI , ANESTHESIA (ADULT)

## 2020-07-28 LAB — EMMI , PATIENT SATISFACTION: HOSPITAL DISCHARGE EXPECTATIONS

## 2020-07-28 LAB — EMMI,  PAIN MANAGEMENT: ACUTE IN HOSPITAL

## 2020-07-28 NOTE — Telephone Encounter (Signed)
Routing to RN

## 2020-07-28 NOTE — Telephone Encounter (Addendum)
MACRN: Coordination care IV Home infusion    1.PT called -will call back at noon today to go over plan of care      2.I have called Bridgette back [facility] nursing services  [phone] direct 24hr phone#) -VM left    -[facility] will [facility] Nursing Service    3. Picc Line placement scheduled for 08/14/2020

## 2020-07-28 NOTE — Telephone Encounter (Signed)
MACRN:  PLAN OF CARE: Pt called reviewed plan of care:  Sent mychart message with prioritized scheduling.       Patient Instructions  -sign up to the [place] patient access. We will message you frequently through this portal  -schedule Sputum Inductions ASAP (would like to have done prior to starting MAC Therapy) call [phone] to schedule  -please have a Pulmonary Function Test call [phone] to schedule  -we have placed a consult to Immunology [facility]. Pls schedule an appointment  -we have placed a consult to Gastroenterology Clinic. Pls schedule an appointment  -we have placed a referral consult to the GI Clinic  - continue to do physical activity with walking-include uphill segments  - continue to do nebulized saline twice a day. We will send a prescription to your pharmacy [facility]  -we will schedule you with our respiratory therapist to go over nebulization and cleaning of the supplies. Please bring your nebulizer and saline to this appointment.  -follow up with my nurse Practioner in 5-6 weeks Harrie Foreman NP at [facility] and me at the [facility] site in 2 months  -please review this website: [email or web address]-[email or web address]  - Also review this website and sign up to the newsletter: FedBond.co.za.   - In the pamphlet we provided to you today, pls pay special attention to the chapter, "Prevention and Reducing Exposure".   - we will obtain the drug susceptibility test results from [facility].     Instructions starting MAC Therapy:  -we will set up starting IV antibiotic (Amikacin) home infusion -3 months of therapy  -start Antibiotic regimen daily therapy one antibiotic at a time separated by 5 days.  Azithromycin 260m daily-take for 5 days, then add Ethambutol ---- take for 5 days then add Rifampin, at which point you should be on 3 medications.     Schedule a PICC (catheter) placement approx 3 weeks from today. You would start the Amikacin on that date.      Monitoring during MAC Therapy  -you will have your blood drawn twice a week during IV home antibiotic infusion.   -you will need to submit sputums 3 every month  -Have an EKG every 3-4 months  -have a hearing test Audiogram every 2 weeks  -Ishihara Color Discrimination Test--please google this test and test each eye twice a week. If any changes notify uKoreaimmediately

## 2020-08-01 ENCOUNTER — Ambulatory Visit
Admission: RE | Admit: 2020-08-01 | Discharge: 2020-08-01 | Disposition: A | Discharge status: Home / Self Care | Payer: Medicare Other | Attending: Critical Care Medicine | Admitting: Critical Care Medicine

## 2020-08-01 ENCOUNTER — Other Ambulatory Visit (HOSPITAL_BASED_OUTPATIENT_CLINIC_OR_DEPARTMENT_OTHER): Payer: Medicare Other

## 2020-08-01 DIAGNOSIS — J479 Bronchiectasis, uncomplicated: Secondary | ICD-10-CM | POA: Insufficient documentation

## 2020-08-01 DIAGNOSIS — A310 Pulmonary mycobacterial infection: Secondary | ICD-10-CM | POA: Insufficient documentation

## 2020-08-01 DIAGNOSIS — J984 Other disorders of lung: Secondary | ICD-10-CM

## 2020-08-01 LAB — IMMUNOGLOBULIN PANEL (IGA,IGG,IGM), BLOOD
IGA: 318 mg/dL (ref 70–400)
IGG: 920 mg/dL (ref 700–1600)
IGM: 53 mg/dL (ref 40–230)

## 2020-08-01 LAB — AMIKACIN, PEAK: Amikacin, Peak: <0.8 ug/mL — ABNORMAL LOW (ref 20.0–25.0)

## 2020-08-01 LAB — RF (RHEUMATOID FACTOR), BLOOD: RF: <10 [IU]/mL (ref 0–13)

## 2020-08-01 NOTE — Interdisciplinary (Signed)
PFT lab note:  Sputum was induced using 10% saline. Sputum was obtained and sent to lab.

## 2020-08-02 LAB — SSB, BLOOD: SSB Ab: <0.3 U/mL (ref <7.0)

## 2020-08-02 LAB — ALLERGENS, INHALANT PROFILE
Allergen, Bermuda Grass IgE: <0.1 kU/L (ref <0.10)
Allergen, Cat IgE: <0.1 kU/L (ref <0.10)
Allergen, Common /Short Weed IgE: <0.1 kU/L (ref <0.10)
Allergen, Cottonwood Tree IgE: <0.1 kU/L (ref <0.10)
Allergen, Dog IgE: <0.1 kU/L (ref <0.10)
Allergen, Elm Tree IgE: <0.1 kU/L (ref <0.10)
Allergen, English Plantain Weed IgE: <0.1 kU/L (ref <0.10)
Allergen, Fungi/Mold Alternaria alternata IgE: 0.15 kU/L — ABNORMAL HIGH (ref <0.10)
Allergen, Fungi/Mold Aspergillus fumigatus IgE: <0.1 kU/L (ref <0.10)
Allergen, Fungi/Mold Cladosporum herbarum IgE: <0.1 kU/L (ref <0.10)
Allergen, Fungi/Mold Penicillium chrysogenum IgE: <0.1 kU/L (ref <0.10)
Allergen, Grey Alder Tree IgE: <0.1 kU/L (ref <0.10)
Allergen, Insect Cockroach German IgE: <0.1 kU/L (ref <0.10)
Allergen, Johnson Grass IgE: <0.1 kU/L (ref <0.10)
Allergen, June/Kentucky Grass IgE: <0.1 kU/L (ref <0.10)
Allergen, Lamb Quarters Weed IgE: 0.1 kU/L — ABNORMAL HIGH (ref <0.10)
Allergen, Mesquite Tree IgE: <0.1 kU/L (ref <0.10)
Allergen, Mites, Dermatophagoides farinae IgE: <0.1 kU/L (ref <0.10)
Allergen, Mites, Dermatophagoides pteronyssinus IgE: <0.1 kU/L (ref <0.10)
Allergen, Mugwort Weed IgE: <0.1 kU/L (ref <0.10)
Allergen, Oak Tree IgE: <0.1 kU/L (ref <0.10)
Allergen, Olive Tree IgE: <0.1 kU/L (ref <0.10)
Allergen, Perennial Rye Grass IgE: <0.1 kU/L (ref <0.10)
Allergen, Russian Thistle Weed IgE: 0.1 kU/L — ABNORMAL HIGH (ref <0.10)
Allergen, Timothy Grass IgE: <0.1 kU/L (ref <0.10)
Allergen, Walnut Tree IgE: <0.1 kU/L (ref <0.10)
IGE: 13 kU/L (ref <=214)

## 2020-08-02 LAB — SSA, BLOOD: SSA Ab: 0.6 U/mL (ref <7.0)

## 2020-08-02 LAB — ANA (ANTI-NUCLEAR AB), BLOOD: ANA (Anti-Nuclear Ab): NEGATIVE

## 2020-08-02 NOTE — Telephone Encounter (Signed)
Please schedule as triaged in referral shell:  07/28/2020 09:03:44 PM External Vendor (External Vendor) Left Message Patient [facility]) Hollice Gong User, [place]     07/29/2020 11:27:53 AM In Basket (In [place])     Lorenza Cambridge, RN    Narrative   - please schedule pt for a clinic consult with any first available gen gi provider, NP, PA  or fellows clinic. Thank you

## 2020-08-03 ENCOUNTER — Telehealth (HOSPITAL_BASED_OUTPATIENT_CLINIC_OR_DEPARTMENT_OTHER): Payer: Self-pay | Admitting: Critical Care Medicine

## 2020-08-03 ENCOUNTER — Ambulatory Visit
Admission: RE | Admit: 2020-08-03 | Discharge: 2020-08-03 | Disposition: A | Discharge status: Home / Self Care | Payer: Medicare Other | Attending: Critical Care Medicine | Admitting: Critical Care Medicine

## 2020-08-03 DIAGNOSIS — J984 Other disorders of lung: Secondary | ICD-10-CM | POA: Insufficient documentation

## 2020-08-03 DIAGNOSIS — A310 Pulmonary mycobacterial infection: Secondary | ICD-10-CM | POA: Insufficient documentation

## 2020-08-03 DIAGNOSIS — J479 Bronchiectasis, uncomplicated: Secondary | ICD-10-CM | POA: Insufficient documentation

## 2020-08-03 LAB — RESPIRATORY CULTURE W/GRAM STAIN: Gram Stain Result: NORMAL

## 2020-08-03 NOTE — Telephone Encounter (Signed)
Called patient to schedule Clinic no answer, left voicemail message. If patient calls back, please assist with scheduling . Thank you

## 2020-08-03 NOTE — Interdisciplinary (Signed)
PFT Lab note: Sputum induction using 10% hypertonic nachloride. Sample sent to lab.

## 2020-08-03 NOTE — Telephone Encounter (Signed)
MAC drug susceptibility results received from Dr. Ara Kussmaul.     To be scanned in media.     (BAL 04/28/2020)

## 2020-08-04 LAB — ALPHA-1-ANTITRYPSIN PHENO PANEL, BLOOD: Alpha-1-Antitrypsin: 114 mg/dL (ref 90–200)

## 2020-08-04 LAB — RESPIRATORY CULTURE W/GRAM STAIN
Gram Stain Result: NONE SEEN
Respiratory Culture Result: NORMAL

## 2020-08-04 NOTE — Telephone Encounter (Signed)
Patient returning missed call and has been scheduled as instructed.

## 2020-08-06 LAB — EMMI,  PAIN MANAGEMENT: ACUTE IN HOSPITAL: EMMI Video Order Number: 19481686376

## 2020-08-06 LAB — EMMI , PATIENT SATISFACTION: HOSPITAL DISCHARGE EXPECTATIONS: EMMI Video Order Number: 12267109978

## 2020-08-06 LAB — RESPIRATORY CULTURE W/GRAM STAIN

## 2020-08-06 LAB — EMMI , ANESTHESIA (ADULT): EMMI Video Order Number: 14299234717

## 2020-08-07 ENCOUNTER — Ambulatory Visit
Admission: RE | Admit: 2020-08-07 | Discharge: 2020-08-07 | Disposition: A | Discharge status: Home / Self Care | Payer: Medicare Other | Attending: Critical Care Medicine | Admitting: Critical Care Medicine

## 2020-08-07 DIAGNOSIS — A310 Pulmonary mycobacterial infection: Secondary | ICD-10-CM | POA: Insufficient documentation

## 2020-08-07 DIAGNOSIS — J479 Bronchiectasis, uncomplicated: Secondary | ICD-10-CM | POA: Insufficient documentation

## 2020-08-07 DIAGNOSIS — J984 Other disorders of lung: Secondary | ICD-10-CM | POA: Insufficient documentation

## 2020-08-07 NOTE — Interdisciplinary (Signed)
PFT Lab Note: Sputum induction using 10% hypertonic Saline neb. Sample sent lab

## 2020-08-08 ENCOUNTER — Encounter (HOSPITAL_BASED_OUTPATIENT_CLINIC_OR_DEPARTMENT_OTHER): Payer: Self-pay

## 2020-08-08 LAB — AFB CULTURE W/STAIN: AFB Smear Result: NEGATIVE

## 2020-08-08 NOTE — Telephone Encounter (Signed)
MACRN: follow up call review plan of care  1. Start MAC Antibx  2. Picc Line placement scheduled for 8/8  [place]  IV Medication-[facility]    Left detailed VM and sent [place] message when to CB.

## 2020-08-09 NOTE — Telephone Encounter (Signed)
MACRN: verbalized confirmation and plan of care and pt returns understanding.    Pt called and follow up Plan of care:    Confirmed_PICC LINE placement 8/8 [place]  [phone]  [phone]    All Is confirmed with agencies.   They will contact patient.     MAC TX:  Azithromycin 255m daily-satrted 08/07/20  Ethambutol 8015mdaily start 08/12/20  Rifampin 60028maily satrt 08/17/20    PICC Line and Amikacin start 08/14/20    Q Monday and Thursday with Amikacin Peak draw 1 hour post infusion.

## 2020-08-09 NOTE — Telephone Encounter (Signed)
MACRN: verbalized confirmation and plan of care and pt returns understanding.    Pt called and follow up Plan of care:    Confirmed_PICC LINE placement 8/8 [place]  [phone]  [phone]    All Is confirmed with agencies.   They will contact patient.     MAC TX:  Azithromycin 279m daily-satrted 08/07/20  Ethambutol 8049mdaily start 08/12/20  Rifampin 60069maily satrt 08/17/20    PICC Line and Amikacin start 08/14/20    Q Monday and Thursday with Amikacin Peak draw 1 hour post infusion

## 2020-08-10 ENCOUNTER — Other Ambulatory Visit: Payer: Self-pay

## 2020-08-10 LAB — RESPIRATORY CULTURE W/GRAM STAIN: Respiratory Culture Result: NORMAL

## 2020-08-11 ENCOUNTER — Encounter (INDEPENDENT_AMBULATORY_CARE_PROVIDER_SITE_OTHER): Payer: Self-pay

## 2020-08-11 ENCOUNTER — Other Ambulatory Visit (INDEPENDENT_AMBULATORY_CARE_PROVIDER_SITE_OTHER): Payer: Self-pay | Admitting: Critical Care Medicine

## 2020-08-11 DIAGNOSIS — J471 Bronchiectasis with (acute) exacerbation: Secondary | ICD-10-CM

## 2020-08-11 MED ORDER — ALBUTEROL SULFATE 108 (90 BASE) MCG/ACT IN AERS
2.0000 | INHALATION_SPRAY | Freq: Four times a day (QID) | RESPIRATORY_TRACT | 3 refills | Status: DC | PRN
Start: 2020-08-11 — End: 2021-04-23

## 2020-08-11 NOTE — Telephone Encounter (Signed)
Orders pended for Ventolin to [facility]

## 2020-08-13 ENCOUNTER — Other Ambulatory Visit: Payer: Self-pay

## 2020-08-14 ENCOUNTER — Ambulatory Visit
Admission: RE | Admit: 2020-08-14 | Discharge: 2020-08-14 | Disposition: A | Discharge status: Home / Self Care | Payer: Medicare Other | Attending: Vascular & Interventional Radiology | Admitting: Vascular & Interventional Radiology

## 2020-08-14 ENCOUNTER — Encounter (HOSPITAL_BASED_OUTPATIENT_CLINIC_OR_DEPARTMENT_OTHER)
Admission: RE | Disposition: A | Discharge status: Home Health | Payer: Self-pay | Attending: Vascular & Interventional Radiology

## 2020-08-14 ENCOUNTER — Telehealth (INDEPENDENT_AMBULATORY_CARE_PROVIDER_SITE_OTHER): Payer: Self-pay | Admitting: Critical Care Medicine

## 2020-08-14 ENCOUNTER — Ambulatory Visit (HOSPITAL_BASED_OUTPATIENT_CLINIC_OR_DEPARTMENT_OTHER): Payer: Medicare Other

## 2020-08-14 DIAGNOSIS — A310 Pulmonary mycobacterial infection: Secondary | ICD-10-CM | POA: Insufficient documentation

## 2020-08-14 DIAGNOSIS — A319 Mycobacterial infection, unspecified: Secondary | ICD-10-CM

## 2020-08-14 LAB — COVID-19 BINAXNOW ANTIGEN (POCT): COVID-19 Antigen (POCT): NEGATIVE

## 2020-08-14 SURGERY — IR PLCMT PICC
Anesthesia: Local

## 2020-08-14 MED ORDER — HEPARIN SODIUM LOCK FLUSH 100 UNIT/ML IJ SOLN CUSTOM
INTRAVENOUS | Status: AC
Start: 2020-08-14 — End: ?
  Filled 2020-08-14: qty 5

## 2020-08-14 MED ORDER — LIDOCAINE HCL 1 % IJ SOLN
INTRAMUSCULAR | Status: DC | PRN
Start: 2020-08-14 — End: 2020-08-14
  Administered 2020-08-14: 10 mL via INTRADERMAL

## 2020-08-14 MED ORDER — HEPARIN SODIUM LOCK FLUSH 100 UNIT/ML IJ SOLN CUSTOM
INTRAVENOUS | Status: DC | PRN
Start: 2020-08-14 — End: 2020-08-14
  Administered 2020-08-14: 500 [IU]

## 2020-08-14 SURGICAL SUPPLY — 15 items
COVER FLEXIBLE LIGHT HANDLE SINGLE- STERILE (Drapes/towels) ×2
COVER PROBE MICROTEK INTRAOPERATIVE 5" X 96" PC1308 (Drapes/towels) ×2
COVER SNAP KOVER BANDED BAG 30" X 36" (Drapes/towels) ×2 IMPLANT
DRAPE ANGIO TIBURON BRACHIAL 38" X 44" (Drapes/towels) ×2 IMPLANT
DRESSING BIOPATCH OD 1" ID 4MM (Dressings/packing) ×2
DRESSING TEGADERM HP 4X4 (Dressings/packing) ×4
DRESSING TEGADERM I.V. ADVANCED 4X4 1688 (Dressings/packing) ×2 IMPLANT
FLOWSWITCH TIP HP (Misc Surgical Supply) ×2 IMPLANT
GLOVE BIOGEL PI ULTRATOUCH SIZE 6.5 (Gloves/Gowns) ×4 IMPLANT
GLOVE BIOGEL PI ULTRATOUCH SIZE 7 (Gloves/Gowns) ×4
PICC POWERPICC 4FR, SINGLE LUMEN (Tunneled or Long-term catheters) ×2 IMPLANT
PREP CHLOROPREP 10.5ML 1-STEP (Prep Solutions) IMPLANT
PROCEDURE PACK - IR NON-VASCULAR (Procedure Packs/kits) ×2 IMPLANT
TOWELS OR BLUE 4-PACK STERILE, DISPOSABLE (Drapes/towels) ×2 IMPLANT
VALVE MICROCLAVE CLEAR (MC100) (Misc Medical Supply) ×4

## 2020-08-14 NOTE — RN OR/Procedure Note (Signed)
D/c instructions given, pt verb understanding, pt in NAD, meets d/c instructions

## 2020-08-14 NOTE — Discharge Instructions (Addendum)
General     PAIN MANAGEMENT     You may use over-the-counter acetaminophen (TYLENOL) for minor discomfort, if you are not otherwise restricted from using this medication or any other prescribed pain medication by the doctor that performed your procedure.     WHEN TO CALL YOUR PHYSICIAN     . Fever (temperature > 100 F) and / or chills   . Severe pain / chest pain   . Bleeding, redness / drainage or new swelling around the incision site   . Shortness of breath   . Nausea or vomiting     Tubes/drains/lines:     If you have a drain or tube in place, no swimming, baths, or soaking in hot tubs.     MEDICATIONS     Please resume taking your regular medications, unless otherwise told by your doctor.     PHYSICIAN PERFORMING YOUR PROCEDURE: Dr. Redmond    ADDITIONAL INSTRUCTIONS:     CONTACT US:     During regular business hours, M-F 8:00 to 5:00, please call [phone] ([facility]) or [phone] ([facility])     After Hours: Please call the [place] Page Operator at [phone] and ask for the Interventional Radiologist On-Call.     Please call the [place] Page Operator at [phone] and ask for the Neurosurgery doctor On-Call (for Neuro patients)

## 2020-08-14 NOTE — Telephone Encounter (Signed)
Tiffany Chen from [place] [facility] called requesting PICC line report.     Please assist, Thank you     Ph. [phone]  Fax. [phone]

## 2020-08-14 NOTE — Telephone Encounter (Signed)
Faxed prelim report to [phone]. It has not been signed yet

## 2020-08-15 ENCOUNTER — Telehealth (INDEPENDENT_AMBULATORY_CARE_PROVIDER_SITE_OTHER): Payer: Self-pay | Admitting: Critical Care Medicine

## 2020-08-15 ENCOUNTER — Telehealth (HOSPITAL_BASED_OUTPATIENT_CLINIC_OR_DEPARTMENT_OTHER): Payer: Self-pay | Admitting: Critical Care Medicine

## 2020-08-15 NOTE — Telephone Encounter (Signed)
P[facility] Sleep [facility] RN Triage Note:     RN notified MD that the blood test drawn yesterday wasn't processed due to:    1. It was submitted to [facility] and patient is not an existing patient in their system.    2. The blood draw tube used for CBC was incorrect.     RN notified the [facility] to re-collect the blood test tomorrow and submit the sample to [place] instead. She verbalized understanding.     Lucio Edward MSN, RN   Pulmonary and Sleep Medicine  P: [phone]

## 2020-08-15 NOTE — Telephone Encounter (Signed)
P[facility] Sleep [facility] RN Triage Note:     RN called Suzette Battiest RN --- [facility] stated that she submitted the blood work to [facility]. Advised to submit all remainder blood work to [facility] instead.    Still waiting to clarify frequency of Amikacin peak.    HH will fax the lab results from yesterday to [facility].    Lucio Edward MSN, RN  [facility]  P: [phone]

## 2020-08-15 NOTE — Telephone Encounter (Signed)
P[facility] RN Triage Note:     RN spoke with Financial risk analyst. [facility] will not process the lab. HHRN will re-draw tomorrow and submit to [place].    Lucio Edward MSN, RN  [facility] [place]  P: [phone]

## 2020-08-15 NOTE — Telephone Encounter (Signed)
P[facility] Sleep [facility] RN Triage Note:     Routing to MD for lab clarification.     Lucio Edward MSN, RN  [place] [facility]  P: [phone]

## 2020-08-15 NOTE — Telephone Encounter (Signed)
RN spoke with Financial risk analyst. [facility] will not process the lab. HHRN will re-draw tomorrow and submit to [place].

## 2020-08-15 NOTE — Telephone Encounter (Signed)
Erskin Burnet, RN with [facility], is calling regarding patient's amikacin (AMIKIN) 600 mg in dextrose 100 mL IVPB. Suzette Battiest received conflicting information from the Pharmacy and would like to verify the following 3 items:    1. Frequency of the medication - Should it be administered 2 or 3 times weekly.     2. Frequency of the lab draws - Monday, Wednesday, Friday, or just Monday and Thursday. Also, should labs be drawn 1 hour after the whole bag is finished, or 1 hour after starting to administer the medication?      3. Once the labs are drawn, should they be dropped off at a [facility] lab, or should she drop them off to [facility] lab.     Suzette Battiest is requesting a call back for further verification, and can be reached at [phone]. Per Suzette Battiest, it is OK to leave a detailed message since it's her personal cell. Urgent per her request. Please advise.

## 2020-08-15 NOTE — Telephone Encounter (Signed)
Tiffany Chen, Holiday representative for [employer] [facility] called requesting additional contact information for person that submitted blood work due to pt not established with [facility]. Rhonda requested ph # to inform [facility] staff to avoid delay in pt care.     Tiffany Chen was transferred to [employer] to better assist.     FYI.

## 2020-08-15 NOTE — Telephone Encounter (Signed)
Iselin Calling from [facility] lab she says she is missing DX code and can't find pt record in [place]. She also wanted to know when the last dose of Amikacin was for the pt. She has the following labs to be done with the pt. Please assist, thank you    [facility] lab  CB# [phone]

## 2020-08-15 NOTE — Telephone Encounter (Signed)
Tiffany Chen has called in from [facility] stating that a VM was received and she requesting a call back to go over one thing.      CB [phone]

## 2020-08-16 ENCOUNTER — Telehealth (HOSPITAL_BASED_OUTPATIENT_CLINIC_OR_DEPARTMENT_OTHER): Payer: Self-pay

## 2020-08-16 ENCOUNTER — Other Ambulatory Visit
Admission: RE | Admit: 2020-08-16 | Discharge: 2020-08-16 | Disposition: A | Discharge status: Home / Self Care | Payer: Medicare Other | Attending: Critical Care Medicine | Admitting: Critical Care Medicine

## 2020-08-16 ENCOUNTER — Other Ambulatory Visit (INDEPENDENT_AMBULATORY_CARE_PROVIDER_SITE_OTHER): Payer: Medicare Other

## 2020-08-16 ENCOUNTER — Encounter (HOSPITAL_BASED_OUTPATIENT_CLINIC_OR_DEPARTMENT_OTHER): Payer: Self-pay

## 2020-08-16 DIAGNOSIS — A319 Mycobacterial infection, unspecified: Secondary | ICD-10-CM

## 2020-08-16 DIAGNOSIS — A310 Pulmonary mycobacterial infection: Secondary | ICD-10-CM

## 2020-08-16 LAB — CBC WITH DIFF, BLOOD
ANC-Automated: 5.2 10*3/uL (ref 1.6–7.0)
Abs Basophils: 0 10*3/uL (ref <0.2)
Abs Eosinophils: 0.1 10*3/uL (ref 0.0–0.5)
Abs Lymphs: 1.4 10*3/uL (ref 0.8–3.1)
Abs Monos: 0.6 10*3/uL (ref 0.2–0.8)
Basophils: 1 %
Eosinophils: 1 %
Hct: 35.6 % (ref 34.0–45.0)
Hgb: 11.2 gm/dL (ref 11.2–15.7)
Lymphocytes: 19 %
MCH: 27.3 pg (ref 26.0–32.0)
MCHC: 31.5 g/dL — ABNORMAL LOW (ref 32.0–36.0)
MCV: 86.8 um3 (ref 79.0–95.0)
MPV: 10.7 fL (ref 9.4–12.4)
Monocytes: 8 %
Plt Count: 273 10*3/uL (ref 140–370)
RBC: 4.1 10*6/uL (ref 3.90–5.20)
RDW: 14.6 % — ABNORMAL HIGH (ref 12.0–14.0)
Segs: 71 %
WBC: 7.4 10*3/uL (ref 4.0–10.0)

## 2020-08-16 LAB — COMPREHENSIVE METABOLIC PANEL, BLOOD
ALT (SGPT): 11 U/L (ref 0–33)
AST (SGOT): 18 U/L (ref 0–32)
Albumin: 4.2 g/dL (ref 3.5–5.2)
Alkaline Phos: 65 U/L (ref 40–130)
Anion Gap: 12 mmol/L (ref 7–15)
BUN: 16 mg/dL (ref 8–23)
Bicarbonate: 25 mmol/L (ref 22–29)
Bilirubin, Tot: 0.32 mg/dL (ref <1.2)
Calcium: 9.6 mg/dL (ref 8.5–10.6)
Chloride: 98 mmol/L (ref 98–107)
Creatinine: 0.55 mg/dL (ref 0.51–0.95)
GFR: >60 mL/min
Glucose: 121 mg/dL — ABNORMAL HIGH (ref 70–99)
Potassium: 4 mmol/L (ref 3.5–5.1)
Sodium: 135 mmol/L — ABNORMAL LOW (ref 136–145)
Total Protein: 6.8 g/dL (ref 6.0–8.0)
eGFR Based on CKD-EPI 2021 Equation: >60 mL/min

## 2020-08-16 LAB — AMIKACIN, PEAK: Amikacin, Peak: 24.9 ug/mL (ref 20.0–25.0)

## 2020-08-16 NOTE — Telephone Encounter (Addendum)
MACRN: follow up: Start IV TX-started IV Amikacin  Plan : 3 times a week M/W/F for 3 months.  Start: 08/14/2020  End: 11/14/2020    LABS: Q M/W      Pt called-noted did well with first dose, will rec 2nd dose today 8/10-will have RN present to assist with teaching Amber  Mon/Fri-blood draws- to [place] :[place]  [phone]    [place]:  [phone]  [phone]    Azithromycin-started daily  Ethambutol-started-daily  Rifampin-started-daily    on MAC TX medications po-all as of 08/17/2020    PLAN OF CARE:  -[facility] called-spoked with Bridgette-discussed teaching on infusion and support and communication with pt. Pt had verbalized some frustration with the communication.   -confirmed labs to be drawn M/FRi 1 hr post infusion-Amikacin Peak  -LABS to go to [place] (labs went to [facility] on Mon 8/8-results will be faxed to [phone])  -Mychart message sent to pt with plan of care  -Labs twice a week-  -Audiogram-every 2 weeks-current stat orders in place-she will call today  -Sputums-will submit in Sept (1 month after being on Medications)  -EKG-will call to schedule-then Q3-4 months  -IIshihara Eye Color discrimination testing-GIven link -confirms to be done twice a week  -message left for [facility] and [facility]-labs drawn M/F 1 hr post infusion and labs to go to [place] look for labs faxed from 8/8/

## 2020-08-18 ENCOUNTER — Other Ambulatory Visit
Admission: RE | Admit: 2020-08-18 | Discharge: 2020-08-18 | Disposition: A | Discharge status: Home / Self Care | Payer: Medicare Other | Attending: Critical Care Medicine | Admitting: Critical Care Medicine

## 2020-08-18 ENCOUNTER — Telehealth (HOSPITAL_BASED_OUTPATIENT_CLINIC_OR_DEPARTMENT_OTHER): Payer: Self-pay

## 2020-08-18 DIAGNOSIS — A310 Pulmonary mycobacterial infection: Secondary | ICD-10-CM | POA: Insufficient documentation

## 2020-08-18 DIAGNOSIS — A319 Mycobacterial infection, unspecified: Secondary | ICD-10-CM | POA: Insufficient documentation

## 2020-08-18 LAB — CBC WITH DIFF, BLOOD
ANC-Automated: 6.3 10*3/uL (ref 1.6–7.0)
Abs Basophils: 0.1 10*3/uL (ref <0.2)
Abs Eosinophils: 0.1 10*3/uL (ref 0.0–0.5)
Abs Lymphs: 1.1 10*3/uL (ref 0.8–3.1)
Abs Monos: 0.6 10*3/uL (ref 0.2–0.8)
Basophils: 1 %
Eosinophils: 1 %
Hct: 37.9 % (ref 34.0–45.0)
Hgb: 12.3 gm/dL (ref 11.2–15.7)
Lymphocytes: 14 %
MCH: 28.1 pg (ref 26.0–32.0)
MCHC: 32.5 g/dL (ref 32.0–36.0)
MCV: 86.5 um3 (ref 79.0–95.0)
MPV: 10.6 fL (ref 9.4–12.4)
Monocytes: 7 %
Plt Count: 249 10*3/uL (ref 140–370)
RBC: 4.38 10*6/uL (ref 3.90–5.20)
RDW: 14.6 % — ABNORMAL HIGH (ref 12.0–14.0)
Segs: 77 %
WBC: 8.2 10*3/uL (ref 4.0–10.0)

## 2020-08-18 LAB — AMIKACIN, PEAK: Amikacin, Peak: 20.4 ug/mL (ref 20.0–25.0)

## 2020-08-18 LAB — COMPREHENSIVE METABOLIC PANEL, BLOOD
ALT (SGPT): 13 U/L (ref 0–33)
AST (SGOT): 18 U/L (ref 0–32)
Albumin: 4.3 g/dL (ref 3.5–5.2)
Alkaline Phos: 68 U/L (ref 40–130)
Anion Gap: 12 mmol/L (ref 7–15)
BUN: 16 mg/dL (ref 8–23)
Bicarbonate: 26 mmol/L (ref 22–29)
Bilirubin, Tot: 0.85 mg/dL (ref <1.2)
Calcium: 9.8 mg/dL (ref 8.5–10.6)
Chloride: 101 mmol/L (ref 98–107)
Creatinine: 0.58 mg/dL (ref 0.51–0.95)
GFR: >60 mL/min
Glucose: 124 mg/dL — ABNORMAL HIGH (ref 70–99)
Potassium: 4.2 mmol/L (ref 3.5–5.1)
Sodium: 139 mmol/L (ref 136–145)
Total Protein: 6.9 g/dL (ref 6.0–8.0)
eGFR Based on CKD-EPI 2021 Equation: >60 mL/min

## 2020-08-18 NOTE — Telephone Encounter (Addendum)
MACRN:    Tiffany Chen, Tiffany Chen MR# [id number] IV Amikacin x 29months   Start date: 08/14/2020  End date: Nov    /DATE/ WBC HgB PLT Creatinine AST ALT Amikacin PEAK     08/16/20 7.4 11.2 273 0.55 18 11 24.9     08/18/20 8.2 12.3 249 0.58 18 13 20.4     08/21/20 10.1 12.6 280 0.55 19 14 Not run       Pt wlll have labs this WED-8/17

## 2020-08-21 ENCOUNTER — Other Ambulatory Visit
Admission: RE | Admit: 2020-08-21 | Discharge: 2020-08-21 | Disposition: A | Discharge status: Home / Self Care | Payer: Medicare Other | Attending: Critical Care Medicine | Admitting: Critical Care Medicine

## 2020-08-21 ENCOUNTER — Telehealth (INDEPENDENT_AMBULATORY_CARE_PROVIDER_SITE_OTHER): Payer: Self-pay | Admitting: Critical Care Medicine

## 2020-08-21 DIAGNOSIS — A319 Mycobacterial infection, unspecified: Secondary | ICD-10-CM | POA: Insufficient documentation

## 2020-08-21 LAB — CBC WITH DIFF, BLOOD
ANC-Automated: 8.3 10*3/uL — ABNORMAL HIGH (ref 1.6–7.0)
Abs Basophils: 0 10*3/uL (ref <0.2)
Abs Eosinophils: 0 10*3/uL (ref 0.0–0.5)
Abs Lymphs: 1.1 10*3/uL (ref 0.8–3.1)
Abs Monos: 0.6 10*3/uL (ref 0.2–0.8)
Basophils: 0 %
Eosinophils: 0 %
Hct: 38.6 % (ref 34.0–45.0)
Hgb: 12.6 gm/dL (ref 11.2–15.7)
Lymphocytes: 10 %
MCH: 28.6 pg (ref 26.0–32.0)
MCHC: 32.6 g/dL (ref 32.0–36.0)
MCV: 87.5 um3 (ref 79.0–95.0)
MPV: 10.5 fL (ref 9.4–12.4)
Monocytes: 6 %
Plt Count: 280 10*3/uL (ref 140–370)
RBC: 4.41 10*6/uL (ref 3.90–5.20)
RDW: 14.5 % — ABNORMAL HIGH (ref 12.0–14.0)
Segs: 83 %
WBC: 10.1 10*3/uL — ABNORMAL HIGH (ref 4.0–10.0)

## 2020-08-21 LAB — COMPREHENSIVE METABOLIC PANEL, BLOOD
ALT (SGPT): 14 U/L (ref 0–33)
AST (SGOT): 19 U/L (ref 0–32)
Albumin: 4.4 g/dL (ref 3.5–5.2)
Alkaline Phos: 76 U/L (ref 40–130)
Anion Gap: 15 mmol/L (ref 7–15)
BUN: 19 mg/dL (ref 8–23)
Bicarbonate: 23 mmol/L (ref 22–29)
Bilirubin, Tot: 0.62 mg/dL (ref <1.2)
Calcium: 10.2 mg/dL (ref 8.5–10.6)
Chloride: 99 mmol/L (ref 98–107)
Creatinine: 0.55 mg/dL (ref 0.51–0.95)
GFR: >60 mL/min
Glucose: 118 mg/dL — ABNORMAL HIGH (ref 70–99)
Potassium: 4.3 mmol/L (ref 3.5–5.1)
Sodium: 137 mmol/L (ref 136–145)
Total Protein: 7.2 g/dL (ref 6.0–8.0)
eGFR Based on CKD-EPI 2021 Equation: >60 mL/min

## 2020-08-21 NOTE — Telephone Encounter (Signed)
Tiffany Chen from [facility] requesting to please fax lab test results from last Friday to fax number below. Please assist, thank you.    [facility]  ph. [phone]  fax. [phone]

## 2020-08-22 ENCOUNTER — Telehealth (INDEPENDENT_AMBULATORY_CARE_PROVIDER_SITE_OTHER): Payer: Self-pay | Admitting: Critical Care Medicine

## 2020-08-22 NOTE — Telephone Encounter (Signed)
Belen calling from [facility]  in regards to Labs drawn yesterday she stated they were not good and that they will be taken labs again on Wednesday and Friday. Please review  Thank you  Ph [phone]

## 2020-08-22 NOTE — Telephone Encounter (Signed)
Faxed as requested

## 2020-08-22 NOTE — Telephone Encounter (Signed)
See RN Drue Flirt note. Labs and amikacin peak acceptable.     No change in plan or medication dosing.

## 2020-08-22 NOTE — Telephone Encounter (Signed)
MACRN: pt with IV Amikacin 3 times a week M/W/F. Labs to be drawn on MOn/FRi   return call to   [facility]  P:[phone]   reports lab tubes not drawn correctly for Amikacin Peak-yesterday MON draw.   Will draw Amikacin PEAK this WEd 08/17 the and Friday 08/19    Avangelina, Flight MR# [id number] IV Amikacin x 1months   Start date: 08/14/2020  End date: Nov    /DATE/ WBC HgB PLT Creatinine AST ALT Amikacin PEAK     08/16/20 7.4 11.2 273 0.55 18 11 24.9     08/18/20 8.2 12.3 249 0.58 18 13 20.4     08/21/20 10.1 12.6 280 0.55 19 14 Not run

## 2020-08-22 NOTE — Telephone Encounter (Signed)
FYI

## 2020-08-23 ENCOUNTER — Ambulatory Visit (INDEPENDENT_AMBULATORY_CARE_PROVIDER_SITE_OTHER): Payer: Medicare Other | Admitting: Audiology

## 2020-08-23 ENCOUNTER — Ambulatory Visit
Admission: RE | Admit: 2020-08-23 | Discharge: 2020-08-23 | Disposition: A | Discharge status: Home / Self Care | Payer: Medicare Other | Attending: Critical Care Medicine | Admitting: Critical Care Medicine

## 2020-08-23 DIAGNOSIS — J479 Bronchiectasis, uncomplicated: Secondary | ICD-10-CM | POA: Insufficient documentation

## 2020-08-23 DIAGNOSIS — R001 Bradycardia, unspecified: Secondary | ICD-10-CM | POA: Insufficient documentation

## 2020-08-23 DIAGNOSIS — A310 Pulmonary mycobacterial infection: Secondary | ICD-10-CM | POA: Insufficient documentation

## 2020-08-23 DIAGNOSIS — H903 Sensorineural hearing loss, bilateral: Secondary | ICD-10-CM

## 2020-08-23 DIAGNOSIS — H9313 Tinnitus, bilateral: Secondary | ICD-10-CM

## 2020-08-23 DIAGNOSIS — J984 Other disorders of lung: Secondary | ICD-10-CM | POA: Insufficient documentation

## 2020-08-23 DIAGNOSIS — I498 Other specified cardiac arrhythmias: Secondary | ICD-10-CM | POA: Insufficient documentation

## 2020-08-23 DIAGNOSIS — R42 Dizziness and giddiness: Secondary | ICD-10-CM

## 2020-08-23 DIAGNOSIS — I499 Cardiac arrhythmia, unspecified: Secondary | ICD-10-CM | POA: Insufficient documentation

## 2020-08-23 DIAGNOSIS — I491 Atrial premature depolarization: Secondary | ICD-10-CM | POA: Insufficient documentation

## 2020-08-23 NOTE — Progress Notes (Signed)
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  Patient is also on Ethambutol, Rifampin, and azithromycin.  She reported no changes in her hearing or her bilateral, fluctuating tinnitus since starting the amikacin.  She reported for the past year, she has been getting dizziness once in a while and has to get up slowly.  Patient stated about a year ago she fainted when getting up too fast, fainted, and hit her head; a head CT showed an aneurysm.  Denied aural pain/pressure/drainage, ear surgery, and noise exposure.    Otoscopy  Unremarkable right ear.  Left ear with partially occluding cerumen deep in the canal with about 3/4 of the TM visible and appearing unremarkable.    Results  Testing for the right ear indicated normal hearing [phone] Hz, sloping from very mild to moderate sensorineural hearing loss 02-7998 Hz.  Testing for the left ear indicated normal hearing [phone] Hz, borderline normal hearing at 1500 Hz, sloping from very mild to moderate sensorineural hearing loss 02-7998 Hz.  No asymmetries were present.  Speech discrimination was excellent in the right ear and very good in the left ear at normal presentation levels.    Ultra high frequency testing showed moderate to severe hearing loss bilaterally.    DNT immittance as patient was non-symptomatic for middle ear pathology.    Recommendations  Patient reported and it was confirmed by Dr. Lysbeth Penner notes that she needs serial audiograms every 2 weeks for the next 3 months; patient will schedule them before leaving today.  Patient is a candidate for hearing aids bilaterally; she is not interested at this time as she does not perceive a deficit.    Audiogram graph is available for view under media.    Orrin Brigham, Cletis Athens., CCC/A  Sr. Audiologist  Licensed Dispensing Audiologist  [facility]

## 2020-08-25 ENCOUNTER — Other Ambulatory Visit: Admit: 2020-08-25 | Payer: Medicare Other

## 2020-08-25 LAB — CBC WITH DIFF, BLOOD
ANC-Automated: 4.8 10*3/uL (ref 1.6–7.0)
Abs Basophils: 0 10*3/uL (ref <0.2)
Abs Eosinophils: 0.1 10*3/uL (ref 0.0–0.5)
Abs Lymphs: 1.5 10*3/uL (ref 0.8–3.1)
Abs Monos: 0.5 10*3/uL (ref 0.2–0.8)
Basophils: 0 %
Eosinophils: 1 %
Hct: 36.4 % (ref 34.0–45.0)
Hgb: 11.8 gm/dL (ref 11.2–15.7)
Lymphocytes: 21 %
MCH: 28.2 pg (ref 26.0–32.0)
MCHC: 32.4 g/dL (ref 32.0–36.0)
MCV: 86.9 um3 (ref 79.0–95.0)
MPV: 10.8 fL (ref 9.4–12.4)
Monocytes: 7 %
Plt Count: 292 10*3/uL (ref 140–370)
RBC: 4.19 10*6/uL (ref 3.90–5.20)
RDW: 14.2 % — ABNORMAL HIGH (ref 12.0–14.0)
Segs: 69 %
WBC: 6.9 10*3/uL (ref 4.0–10.0)

## 2020-08-25 LAB — COMPREHENSIVE METABOLIC PANEL, BLOOD
ALT (SGPT): 14 U/L (ref 0–33)
AST (SGOT): 23 U/L (ref 0–32)
Albumin: 4.2 g/dL (ref 3.5–5.2)
Alkaline Phos: 65 U/L (ref 40–130)
Anion Gap: 11 mmol/L (ref 7–15)
BUN: 13 mg/dL (ref 8–23)
Bicarbonate: 25 mmol/L (ref 22–29)
Bilirubin, Tot: 0.31 mg/dL (ref <1.2)
Calcium: 10 mg/dL (ref 8.5–10.6)
Chloride: 98 mmol/L (ref 98–107)
Creatinine: 0.52 mg/dL (ref 0.51–0.95)
GFR: >60 mL/min
Glucose: 80 mg/dL (ref 70–99)
Potassium: 4.5 mmol/L (ref 3.5–5.1)
Sodium: 134 mmol/L — ABNORMAL LOW (ref 136–145)
Total Protein: 7.2 g/dL (ref 6.0–8.0)
eGFR Based on CKD-EPI 2021 Equation: >60 mL/min

## 2020-08-25 LAB — AMIKACIN, PEAK: Amikacin, Peak: 25.2 ug/mL — ABNORMAL HIGH (ref 20.0–25.0)

## 2020-08-28 ENCOUNTER — Telehealth (INDEPENDENT_AMBULATORY_CARE_PROVIDER_SITE_OTHER): Payer: Self-pay | Admitting: Critical Care Medicine

## 2020-08-28 ENCOUNTER — Telehealth (HOSPITAL_BASED_OUTPATIENT_CLINIC_OR_DEPARTMENT_OTHER): Payer: Self-pay | Admitting: Critical Care Medicine

## 2020-08-28 ENCOUNTER — Other Ambulatory Visit
Admission: RE | Admit: 2020-08-28 | Discharge: 2020-08-28 | Disposition: A | Discharge status: Home / Self Care | Payer: Medicare Other | Attending: Critical Care Medicine | Admitting: Critical Care Medicine

## 2020-08-28 DIAGNOSIS — A319 Mycobacterial infection, unspecified: Secondary | ICD-10-CM

## 2020-08-28 DIAGNOSIS — A310 Pulmonary mycobacterial infection: Secondary | ICD-10-CM

## 2020-08-28 LAB — COMPREHENSIVE METABOLIC PANEL, BLOOD
ALT (SGPT): 11 U/L (ref 0–33)
AST (SGOT): 17 U/L (ref 0–32)
Albumin: 4.1 g/dL (ref 3.5–5.2)
Alkaline Phos: 60 U/L (ref 40–130)
Anion Gap: 15 mmol/L (ref 7–15)
BUN: 15 mg/dL (ref 8–23)
Bicarbonate: 23 mmol/L (ref 22–29)
Bilirubin, Tot: 0.21 mg/dL (ref <1.2)
Calcium: 9.4 mg/dL (ref 8.5–10.6)
Chloride: 98 mmol/L (ref 98–107)
Creatinine: 0.5 mg/dL — ABNORMAL LOW (ref 0.51–0.95)
GFR: >60 mL/min
Glucose: 69 mg/dL — ABNORMAL LOW (ref 70–99)
Potassium: 4.3 mmol/L (ref 3.5–5.1)
Sodium: 136 mmol/L (ref 136–145)
Total Protein: 6.4 g/dL (ref 6.0–8.0)
eGFR Based on CKD-EPI 2021 Equation: >60 mL/min

## 2020-08-28 LAB — CBC WITH DIFF, BLOOD
ANC-Automated: 4.7 10*3/uL (ref 1.6–7.0)
Abs Basophils: 0 10*3/uL (ref <0.2)
Abs Eosinophils: 0.1 10*3/uL (ref 0.0–0.5)
Abs Lymphs: 1.2 10*3/uL (ref 0.8–3.1)
Abs Monos: 0.5 10*3/uL (ref 0.2–0.8)
Basophils: 1 %
Eosinophils: 2 %
Hct: 34.9 % (ref 34.0–45.0)
Hgb: 11 gm/dL — ABNORMAL LOW (ref 11.2–15.7)
Imm Gran %: 1 % — ABNORMAL HIGH (ref <1)
Imm Gran Abs: 0.1 10*3/uL — ABNORMAL HIGH (ref <0.1)
Lymphocytes: 18 %
MCH: 27.6 pg (ref 26.0–32.0)
MCHC: 31.5 g/dL — ABNORMAL LOW (ref 32.0–36.0)
MCV: 87.5 um3 (ref 79.0–95.0)
MPV: 10.6 fL (ref 9.4–12.4)
Monocytes: 7 %
Plt Count: 287 10*3/uL (ref 140–370)
RBC: 3.99 10*6/uL (ref 3.90–5.20)
RDW: 14.1 % — ABNORMAL HIGH (ref 12.0–14.0)
Segs: 71 %
WBC: 6.6 10*3/uL (ref 4.0–10.0)

## 2020-08-28 LAB — AMIKACIN, PEAK: Amikacin, Peak: 19.9 ug/mL — ABNORMAL LOW (ref 20.0–25.0)

## 2020-08-28 NOTE — Telephone Encounter (Signed)
[facility] health nurse called and asked if patients lab results from Friday can be faxed [facility] Fax: [phone]. Please advise.       Ph: [phone]  Okay to leave message

## 2020-08-28 NOTE — Telephone Encounter (Signed)
Per Dr Rinaldo Ratel msg, Amikacin peak of 20-30 is the goal, no changes in the order.  Jigi from [place] was notified.

## 2020-08-28 NOTE — Telephone Encounter (Signed)
Jigi from [facility] requesting to please call back as soon as possible to review medication amikacin and recent lab results, should pt continue with medication. Please assist, thank you.    Ph: [phone] Deetta Perla

## 2020-08-28 NOTE — Telephone Encounter (Signed)
Lab results 8/19 was faxed to [facility] @ [phone]

## 2020-08-28 NOTE — Telephone Encounter (Signed)
Routing to Dr Fransico Michael to advise  [facility] would like to confirm Amikacin instruction  ==========================================  08/25/20 Amikacin Peak level:  25.2

## 2020-08-29 ENCOUNTER — Encounter (HOSPITAL_BASED_OUTPATIENT_CLINIC_OR_DEPARTMENT_OTHER): Payer: Self-pay | Admitting: Nurse Practitioner

## 2020-08-29 ENCOUNTER — Telehealth (INDEPENDENT_AMBULATORY_CARE_PROVIDER_SITE_OTHER): Payer: Self-pay | Admitting: Critical Care Medicine

## 2020-08-29 ENCOUNTER — Ambulatory Visit: Payer: Medicare Other | Attending: Nurse Practitioner | Admitting: Nurse Practitioner

## 2020-08-29 VITALS — BP 137/74 | HR 88 | Temp 97.6°F | Resp 18 | Ht 62.5 in | Wt 129.0 lb

## 2020-08-29 DIAGNOSIS — A319 Mycobacterial infection, unspecified: Secondary | ICD-10-CM | POA: Insufficient documentation

## 2020-08-29 DIAGNOSIS — I498 Other specified cardiac arrhythmias: Secondary | ICD-10-CM

## 2020-08-29 DIAGNOSIS — A310 Pulmonary mycobacterial infection: Secondary | ICD-10-CM | POA: Insufficient documentation

## 2020-08-29 DIAGNOSIS — I491 Atrial premature depolarization: Secondary | ICD-10-CM

## 2020-08-29 DIAGNOSIS — J479 Bronchiectasis, uncomplicated: Secondary | ICD-10-CM | POA: Insufficient documentation

## 2020-08-29 DIAGNOSIS — R001 Bradycardia, unspecified: Secondary | ICD-10-CM

## 2020-08-29 LAB — ECG 12-LEAD
ATRIAL RATE: 75 {beats}/min
P AXIS: 41 degrees
PR INTERVAL: 142 ms
QRS INTERVAL/DURATION: 72 ms
QT: 376 ms
QTc (Bazett): 419 ms
R AXIS: 67 degrees
T AXIS: 67 degrees
VENTRICULAR RATE: 75 {beats}/min

## 2020-08-29 NOTE — Telephone Encounter (Signed)
Labs8/22  Reviewed.     Unremarkable.     The amikacin peak is below optimal however it was drawn at a different time.     No changes at this time.

## 2020-08-29 NOTE — Patient Instructions (Addendum)
-   Please continue with air way clearance 2-3 times a day ( albuterol solution followed by saline solution and Aerobika/ acapella device).  -Please continue with MAC antibiotic therapy as instructed.   - Please submit sputum samples for AFB and respiratory culture x 3 every 4-6 weeks.  - Continue with lab work (CBC, CMP) every other month.  - Continue with Audiogram every 3-4 months.  - EKG every 3-4 months  - Perform Ishihara discrimination color chart 3 times a week minimum.  - Monitor for acute respiratory symptoms and provide with a call back if worsen.   - Follow up with [name] in 1-2 months

## 2020-08-29 NOTE — Telephone Encounter (Signed)
Lab results from yesterday's  blood draw is now available  Routing to Dr Fransico Michael for review

## 2020-08-29 NOTE — Progress Notes (Signed)
PULMONARY OUTPATIENT CLINIC FOLLOW-UP NOTE    No chief complaint on file.      History of Present Illness:  Tiffany Chen is a 75 year old female with history of bronchiectasis, cavitary MAC lung infection, ruptured diverticulitis s/p colostomy in 2009, DM, ICH s/p embolization. She is here to follow up on bronchiectasis and MAC lung infection.      Patient's last visit on 07/24/2020 with [name] as new patient for bronchiectasis and MAC lung infection evaluation. She is as new patient for me. She states doing well from a respiratory stand point since last visit. She denies ED visits or recent hospitalizations.       Tiffany Chen has history of MAC lung infection treated multiple times with azithromycin 586m, Rifampin 605m, ethambutol 160032mhree times weekly: 2018- 01/19/2018, 05/25/2018- 10/22/2019 and 12/22/2019- 02/16/2020.     As per last note from Dr. [name] In late 2021, CT scan showed enlarging cavity. December 2021 a bronch was performed but no MAC growth. Her doctor restarted azithro/Rifampin/ethmabutol based on imaging.   Feb 2022 she saw [name] stopped the antibiotics based on the lack of microbiologic data. Performed repeat bronch 04/28/2020 which has smear AFB 3+ and culture MAC (drug susc pending).      During her last visit with Dr. ElmDebara Pickette was strongly recommended to start new round of MAC antibiotic therapy due to the severity of the lung cavitation and severity of her symptoms. The regimen recommended was IV antibiotic (Amikacin) home infusion for 3 months, azithromycin 250 mg daily, ethambutol 800 mg daily and rifampin 600 mg daily.      She started on MAC antibiotic therapy on 08/07/2020. She has been tolerating well MAC antibiotics. She reports vomiting once. She also has noticing diarrhea and weight loss since started on MAC antibiotic. She denies other additional side effects with MAC antibiotic. She have had recent audiogram and EKG on  08/23/2020.     Today, Tiffany Chen feeling well not but a her baseline. She currently reports slight increased non productive cough predominantly during the night but in general she has noticed improved cough ever since started on MAC antibiotic. She has been taking MAC antibiotics religiously. She has been doing air way clearance twice a day with saline solution twice a day.She denies wheezing or dyspnea. She denies hemoptysis, fevers, chills, nausea, vomiting. She reports night sweats.  She is currently capable to walk 1 mile without feeling winded. She denies feeling winded when going uphill or on stairs. Sometimes she has to stop an rest. She reports intermittent chest pain, but denies chest congestion, orthopnea, PND lower extremity edema. She reports history of palpitations. She denies allergic rhinitis, sinus problems or other URI. She has history of GERD controlled with diet and PPI. She have had influenza vaccine on fall 2021 and competed COVID vaccines.     Review of Systems:   A full  review of systems was performed and the pertinent positives and negatives were mentioned in the HPI, all other review of systems were negative.     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    No family history on file.    Social History     Socioeconomic History  Marital status: Widowed   Tobacco Use    Smoking status: Never Smoker    Smokeless tobacco: Never Used       Allergies   Allergen Reactions    Psychologist, clinical Other       Current Outpatient Medications on File Prior to Visit   Medication Sig Dispense Refill    albuterol 108 (90 Base) MCG/ACT inhaler Inhale 2 puffs by mouth every 6 hours as needed for Wheezing. 3 each 3    amikacin (AMIKIN) 600 mg in dextrose 100 mL IVPB Inject 600 mg into vein three times a week.       atorvastatin (LIPITOR) 10 MG tablet Take 10 mg by mouth daily.      azithromycin (ZITHROMAX) 250 MG tablet Take 1 tablet (250 mg) by mouth daily. 30 tablet 11    ethambutol (MYAMBUTOL) 400 MG tablet Take 2 tablets (800 mg) by mouth daily. 60 tablet 11    fluoxetine (PROZAC) 20 MG tablet Take 20 mg by mouth daily.      FLUOXETINE HCL PO 30 mg      [DISCONTINUED] MELATONIN ER PO       metFORMIN (GLUCOPHAGE) 1000 mg tablet Take 1,000 mg by mouth 2 times daily (with meals).      OMEPRAZOLE PO       Pediatric Multivitamins-Fl (MULTIVITAMINS/FL PO)       ramipril (ALTACE) 2.5 MG capsule Take 2.5 mg by mouth daily.      rifampin (RIFADIN) 300 MG capsule Take 2 capsules (600 mg) by mouth daily. 60 capsule 11    sodium chloride 7 % NEBU Inhale 4 mL by mouth every 12 hours.      sodium chloride 7 % NEBU 4 mL by Nebulization route every 12 hours. 240 mL 11     No current facility-administered medications on file prior to visit.       Physical Examination:  BP 137/74 (BP Location: Left arm, BP Patient Position: Sitting, BP cuff size: Regular)    Pulse 88    Temp 97.6 F (36.4 C) (Temporal)    Resp 18    Ht 5' 2.5" (1.588 m)    Wt 58.5 kg (129 lb)    SpO2 100%    BMI 23.22 kg/m    Oxygen Therapy  SpO2: 100 %       General: well appearing alert and oriented x 3, in nad.  Oropharynx: without any lesions  Heart:  Regular rate and rhythm, normal S1 S2, no murmurs.  Lungs: clear to auscultation, right upper lobe wheezing, rales, rhonchi, no chest deformities noted. Normal I:E ratio  Extremities:  no cyanosis, clubbing, or edema.  Skin:  No rashes or lesions.  Psych: normal affect and mood       I have reviewed the following  laboratory data and other diagnostic studies:   CBC:  Lab Results   Component Value Date    WBC 6.6 08/28/2020    HGB 11.0 (L) 08/28/2020    HCT 34.9 08/28/2020    PLT 287 08/28/2020     CHEM:  Lab Results   Component Value Date    NA 136 08/28/2020    K 4.3 08/28/2020    CL 98 08/28/2020    BICARB  23 08/28/2020    BUN 15 08/28/2020    CREAT 0.50 (L) 08/28/2020    GLU 69 (L) 08/28/2020    [place] 9.4 08/28/2020     COAG:  No results found for: PT, PTT,  INR  LFTs:  Lab Results   Component Value Date    AST 17 08/28/2020    ALT 11 08/28/2020    ALK 60 08/28/2020    TP 6.4 08/28/2020    ALB 4.1 08/28/2020    TBILI 0.21 08/28/2020    DBILI <0.2 07/17/2020          Review of Radiology Studies:    Pulmonary Function Test Review:  04/28/2020 (care everywhere): noraml spito, normal volumes, mildly reduced DLCO    Radiology Review:  CT chest 04/13/2020:   CONCLUSION:   1. 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   2. Cavitary lesion in the periphery of the right lung has increased in size measuring 2.7 cm, although may have a slightly less thickened and nodular wall.   3. Diffuse bronchiectasis most pronounced in the right upper lobe.     Microbiology  BAL 04/28/2020 (from Dr. Cecile Hearing): AFB smear 3+, culture MAC (drug susceptibly testing sent to [facility]- pending)      ASSESSMENT AND PLAN:  Tiffany Chen is a 75 year old female with history of bronchiectasis, cavitary MAC lung infection, ruptured diverticulitis s/p colostomy in 2009, DM, ICH s/p embolization. She is here to follow up on bronchiectasis and MAC lung infection.     # Bronchiectasis/ Cavitary MAC lung infection: currently stable with mild symptoms. She reports mild cough and fatigue. She was advised to start MAC antibiotic therapy due to the severity of her lesions and symptoms. She started on  IV antibiotic Amikacin home infusion for 3 months, azithromycin 250 mg daily, ethambutol 800 mg daily and rifampin 600 mg daily. She denies mayor side effects and seems tolerating well antibiotic therapy. PFT from 04/28/2020 ( outside) wnl, mildly reduced DLCO, CT Chest from 04/13/2020 with micronodular and tree in bud opacity consistent with MAC infection. Cavitary  lesion to RUL about 2.7 cm. Sputum cultures from 08/03/2020 with preliminary positive result, two additional negative preliminary results. I have discussed air way clearance and MAC antibiotic therapy. I have recommended her to continue with air way clearance twice a day with saline nebulizer solution followed by acapella device ( device given during the visit). Patient was instructed to use albuterol inhaler 2 puffs before air way clearance. I have recommended her continue with Amikacin home infusion for 3 months, azithromycin 250 mg daily, ethambutol 800 mg daily and rifampin 600 mg daily. I have reminded her to continue with lab work ( draw from PICC line) twice a week, sputum cultures for AFB and respiratory cultures x 3 per month, EKG and Audiogram every 3-4 months ( due on 11/2020) and to continue with ishihara discrimination color chart. Patient was advised to monitor for respiratory symptoms and call back if any changes. ED precautions given.     # Immunizations: up to date.     "I personally spend a total of 45 Minutes in face to face and non face to face activities related to patient's visit today, excluding and separately reportable services/procedures."    Follow up with [name] in 2 months.     Zella Richer NP-BC  Pulmonary Medicine

## 2020-08-29 NOTE — Telephone Encounter (Signed)
Tess from [facility] drew patients labs yesterday and wants to know if results were received. Please advise       Ph: [phone]

## 2020-09-01 ENCOUNTER — Other Ambulatory Visit
Admission: RE | Admit: 2020-09-01 | Discharge: 2020-09-01 | Disposition: A | Discharge status: Home / Self Care | Payer: Medicare Other | Attending: Critical Care Medicine | Admitting: Critical Care Medicine

## 2020-09-01 DIAGNOSIS — A310 Pulmonary mycobacterial infection: Secondary | ICD-10-CM

## 2020-09-01 DIAGNOSIS — A319 Mycobacterial infection, unspecified: Secondary | ICD-10-CM | POA: Insufficient documentation

## 2020-09-01 LAB — COMPREHENSIVE METABOLIC PANEL, BLOOD
ALT (SGPT): 12 U/L (ref 0–33)
AST (SGOT): 21 U/L (ref 0–32)
Albumin: 4.1 g/dL (ref 3.5–5.2)
Alkaline Phos: 57 U/L (ref 40–130)
Anion Gap: 12 mmol/L (ref 7–15)
BUN: 15 mg/dL (ref 8–23)
Bicarbonate: 25 mmol/L (ref 22–29)
Bilirubin, Tot: 0.21 mg/dL (ref <1.2)
Calcium: 9.6 mg/dL (ref 8.5–10.6)
Chloride: 98 mmol/L (ref 98–107)
Creatinine: 0.48 mg/dL — ABNORMAL LOW (ref 0.51–0.95)
GFR: >60 mL/min
Glucose: 89 mg/dL (ref 70–99)
Potassium: 4.5 mmol/L (ref 3.5–5.1)
Sodium: 135 mmol/L — ABNORMAL LOW (ref 136–145)
Total Protein: 6.9 g/dL (ref 6.0–8.0)
eGFR Based on CKD-EPI 2021 Equation: >60 mL/min

## 2020-09-01 LAB — CBC WITH DIFF, BLOOD
ANC-Automated: 5.4 10*3/uL (ref 1.6–7.0)
Abs Basophils: 0.1 10*3/uL (ref <0.2)
Abs Eosinophils: 0.2 10*3/uL (ref 0.0–0.5)
Abs Lymphs: 1.6 10*3/uL (ref 0.8–3.1)
Abs Monos: 0.5 10*3/uL (ref 0.2–0.8)
Basophils: 1 %
Eosinophils: 2 %
Hct: 36.5 % (ref 34.0–45.0)
Hgb: 11.7 gm/dL (ref 11.2–15.7)
Lymphocytes: 20 %
MCH: 28.2 pg (ref 26.0–32.0)
MCHC: 32.1 g/dL (ref 32.0–36.0)
MCV: 88 um3 (ref 79.0–95.0)
MPV: 10.7 fL (ref 9.4–12.4)
Monocytes: 7 %
Plt Count: 295 10*3/uL (ref 140–370)
RBC: 4.15 10*6/uL (ref 3.90–5.20)
RDW: 14.1 % — ABNORMAL HIGH (ref 12.0–14.0)
Segs: 70 %
WBC: 7.8 10*3/uL (ref 4.0–10.0)

## 2020-09-01 LAB — AMIKACIN, PEAK: Amikacin, Peak: 20.7 ug/mL (ref 20.0–25.0)

## 2020-09-04 ENCOUNTER — Other Ambulatory Visit
Admission: RE | Admit: 2020-09-04 | Discharge: 2020-09-04 | Disposition: A | Discharge status: Home / Self Care | Payer: Medicare Other | Attending: Critical Care Medicine | Admitting: Critical Care Medicine

## 2020-09-04 DIAGNOSIS — A310 Pulmonary mycobacterial infection: Secondary | ICD-10-CM

## 2020-09-04 DIAGNOSIS — A319 Mycobacterial infection, unspecified: Secondary | ICD-10-CM

## 2020-09-04 LAB — CBC WITH DIFF, BLOOD
ANC-Automated: 3.7 10*3/uL (ref 1.6–7.0)
Abs Basophils: 0.1 10*3/uL (ref <0.2)
Abs Eosinophils: 0.1 10*3/uL (ref 0.0–0.5)
Abs Lymphs: 1.3 10*3/uL (ref 0.8–3.1)
Abs Monos: 0.4 10*3/uL (ref 0.2–0.8)
Basophils: 1 %
Eosinophils: 2 %
Hct: 34 % (ref 34.0–45.0)
Hgb: 10.8 gm/dL — ABNORMAL LOW (ref 11.2–15.7)
Lymphocytes: 24 %
MCH: 27.9 pg (ref 26.0–32.0)
MCHC: 31.8 g/dL — ABNORMAL LOW (ref 32.0–36.0)
MCV: 87.9 um3 (ref 79.0–95.0)
MPV: 10.3 fL (ref 9.4–12.4)
Monocytes: 8 %
Plt Count: 259 10*3/uL (ref 140–370)
RBC: 3.87 10*6/uL — ABNORMAL LOW (ref 3.90–5.20)
RDW: 14.2 % — ABNORMAL HIGH (ref 12.0–14.0)
Segs: 65 %
WBC: 5.6 10*3/uL (ref 4.0–10.0)

## 2020-09-04 LAB — COMPREHENSIVE METABOLIC PANEL, BLOOD
ALT (SGPT): 14 U/L (ref 0–33)
AST (SGOT): 18 U/L (ref 0–32)
Albumin: 4.1 g/dL (ref 3.5–5.2)
Alkaline Phos: 56 U/L (ref 40–130)
Anion Gap: 12 mmol/L (ref 7–15)
BUN: 14 mg/dL (ref 8–23)
Bicarbonate: 27 mmol/L (ref 22–29)
Bilirubin, Tot: 0.2 mg/dL (ref <1.2)
Calcium: 9.5 mg/dL (ref 8.5–10.6)
Chloride: 102 mmol/L (ref 98–107)
Creatinine: 0.5 mg/dL — ABNORMAL LOW (ref 0.51–0.95)
GFR: >60 mL/min
Glucose: 65 mg/dL — ABNORMAL LOW (ref 70–99)
Potassium: 4.1 mmol/L (ref 3.5–5.1)
Sodium: 141 mmol/L (ref 136–145)
Total Protein: 6.3 g/dL (ref 6.0–8.0)
eGFR Based on CKD-EPI 2021 Equation: >60 mL/min

## 2020-09-04 LAB — AMIKACIN, PEAK: Amikacin, Peak: 19.9 ug/mL — ABNORMAL LOW (ref 20.0–25.0)

## 2020-09-06 ENCOUNTER — Ambulatory Visit (INDEPENDENT_AMBULATORY_CARE_PROVIDER_SITE_OTHER): Payer: Medicare Other | Admitting: Audiology

## 2020-09-06 DIAGNOSIS — H90A32 Mixed conductive and sensorineural hearing loss, unilateral, left ear with restricted hearing on the contralateral side: Secondary | ICD-10-CM

## 2020-09-06 DIAGNOSIS — H90A21 Sensorineural hearing loss, unilateral, right ear, with restricted hearing on the contralateral side: Secondary | ICD-10-CM

## 2020-09-06 DIAGNOSIS — H9313 Tinnitus, bilateral: Secondary | ICD-10-CM

## 2020-09-06 NOTE — Progress Notes (Signed)
History  Patient with MAC lung disease; she is here for ototoxic monitoring of her drug cocktail: Ethambutol, Rifampin, azithromycin, and amikacin infusions that started on 08/14/20.  Since her 08/23/20 baseline hearing test, she has not noted any change in hearing nor any change in her intermittent bilateral tinnitus.  Denied dizziness and aural pain/pressure/drainage.    Otoscopy  Unremarkable.  After ear cleaning with Milta Deiters NP today, there was scant wet cerumen noted on the TM, but it was minimal and the TM was largely visible and appeared unremarkable, other than wet the ear cleaning.    Results  Testing for the right ear indicated normal hearing [phone], sloping from very mild to moderate sensorineural hearing loss 02-7998 Hz.  Testing for the left ear indicated normal hearing [phone] Hz, borderline normal hearing at 1500 Hz, sloping from mild sensorineural hearing loss 02-2998 Hz to a moderate mixed hearing loss 04-7998 Hz (15 dB A/B gap).  No A/C asymmetries were present.  Speech discrimination was excellent bilaterally at normal presentation levels.    Ultra high frequency testing showed moderate to severe hearing loss bilaterally.    DNT immittance.    Comparison to previous audio done 08/23/20 showed only a 10 dB poorer left ear A/C threshold at 4000 Hz, making an A/B gap that was not there previously.  It is the opinion of this examiner that this is due to the ear cleaning today and the threshold is expected to improve at the next hearing test in 2 weeks.    Recommendations  Patient to f/u with [name] as scheduled/needed.  Patient is scheduled for serial audiograms for the next 3 months.  Patient should still let [name] and our office know if she has any changes in hearing or tinnitus between now and the next scheduled examination.    Audiogram graph is available for view under media.    Orrin Brigham, Cletis [place]., CCC/A  Sr. Audiologist  Licensed Dispensing Audiologist  [facility]

## 2020-09-07 ENCOUNTER — Encounter (INDEPENDENT_AMBULATORY_CARE_PROVIDER_SITE_OTHER): Payer: Self-pay | Admitting: Allergy

## 2020-09-07 ENCOUNTER — Other Ambulatory Visit: Payer: Medicare Other | Attending: Allergy | Admitting: Allergy

## 2020-09-07 VITALS — BP 132/74 | HR 78 | Temp 97.0°F | Resp 16 | Ht 62.5 in | Wt 126.0 lb

## 2020-09-07 DIAGNOSIS — J479 Bronchiectasis, uncomplicated: Secondary | ICD-10-CM | POA: Insufficient documentation

## 2020-09-07 DIAGNOSIS — A310 Pulmonary mycobacterial infection: Secondary | ICD-10-CM

## 2020-09-07 LAB — IGM, BLOOD: IGM: 41 mg/dL (ref 40–230)

## 2020-09-07 LAB — IGG, BLOOD: IGG: 819 mg/dL (ref 700–1600)

## 2020-09-07 LAB — IGA, BLOOD: IGA: 278 mg/dL (ref 70–400)

## 2020-09-07 NOTE — Interdisciplinary (Signed)
Patient verified with 2 identifiers.  Blood draw completed in patient's LAC, patient tolerated well. Patient discharged home with no active signs of bleeding.

## 2020-09-07 NOTE — Progress Notes (Signed)
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 Might Ig replacement be beneficial"  ______________________________________________________________________    HPI: Tiffany Chen is a 75 year old female here for new consultation for immunodeficiency. They have been referred by Katheren Shams and is a new patient to me. Prior medical records have been reviewed carefully prior to this consultation and salient details have been summarized below. Patient has a history of bronchiectasis, cavitary lung infection (MAC). There is no history of chronic sinusitis. She has had multiple rounds of treatment for MAC, with most recent antibiotic regimen including amikacin, azithromycin, ethambutol and rifampin.     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    Allergies:  Allergies   Allergen Reactions    Psychologist, clinical Other       Medications:    Current Outpatient Medications:     albuterol 108 (90 Base) MCG/ACT inhaler, Inhale 2 puffs by mouth every 6 hours as needed for Wheezing., Disp: 3 each, Rfl: 3    amikacin (AMIKIN) 600 mg in dextrose 100 mL IVPB, Inject 600 mg into vein three times a week., Disp: , Rfl:     atorvastatin (LIPITOR) 10 MG tablet, Take 10 mg by mouth daily., Disp: , Rfl:     azithromycin (ZITHROMAX) 250 MG tablet, Take 1 tablet (250 mg) by mouth daily., Disp: 30 tablet, Rfl: 11    ethambutol (MYAMBUTOL) 400 MG tablet, Take 2 tablets (800 mg) by mouth daily., Disp: 60  tablet, Rfl: 11    fluoxetine (PROZAC) 20 MG tablet, Take 20 mg by mouth daily., Disp: , Rfl:     FLUOXETINE HCL PO, 30 mg, Disp: , Rfl:     metFORMIN (GLUCOPHAGE) 1000 mg tablet, Take 1,000 mg by mouth 2 times daily (with meals)., Disp: , Rfl:     OMEPRAZOLE PO, , Disp: , Rfl:     Pediatric Multivitamins-Fl (MULTIVITAMINS/FL PO), , Disp: , Rfl:     ramipril (ALTACE) 2.5 MG capsule, Take 2.5 mg by mouth daily., Disp: , Rfl:     rifampin (RIFADIN) 300 MG capsule, Take 2 capsules (600 mg) by mouth daily., Disp: 60 capsule, Rfl: 11    sodium chloride 7 % NEBU, Inhale 4 mL by mouth every 12 hours., Disp: , Rfl:     sodium chloride 7 % NEBU, 4 mL by Nebulization route every 12 hours., Disp: 240 mL, Rfl: 11    FH: No FH of atopic disease: asthma, allergic rhinitis, eczema, food allergies    Diagnostic studies:  CMP normal liver, total protein  CBC : no lymphopenia or thrombocytopenia                                Total time breakdown:  Pre-Visit [Reviewing last visit, Care Everywhere, recent labs, images]/Post-Visit [Note completion, placing of orders, coordination of care]/Intra-Visit [Updating relevant history,  performing a video-based physical exam, creating a treatment plan, medical discussion with patient] : 15/    Impression:  75 year old here for evaluation of:    1. Bronchiectasis   2. MAC infection    Recommendations:  1. Bronchiectasis   2. MAC infection  Patient presents at the request of Pulmonary specialist Dr. 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 Total IgG has been in normal range, and there is no history of frequent recurrent sinusitis although lung infection history is noted. We will pursue additional immune work-up including repeat quantitative immunoglobulins as well as vaccine titers (tetanus and pneumococcal) to investigate functional status. If low, will recommend re-vaccination with  Pneumovax-23 last >5 years ago) with repeat titer check 5 weeks after. Advise against given this vaccination now, until our work-up is completed - we can administer in our clinic.   -Labwork as above    RTC to be determined    The plan of care was discussed with the patient who expressed agreement and understanding. Questions were answered prior to conclusion of the visit. Patient seen and examined with Dr Britt Boozer with dx and management as outlined in the consultative report. I have seen and examined the patient with Dr. Paris Lore and agree with the history and physical exam.       Durel Salts, MD  Clinical Associate Professor, [facility]

## 2020-09-07 NOTE — Progress Notes (Signed)
Allergy/Immunology New Consultation    Date: 09/07/2020  Referring MD: Katheren Shams  Reason for consultation: Evaluate for possible immune deficiency  ______________________________________________________________________    HPI: Tiffany Chen is a 75 year old female here for new consultation for evaluation for immune deficiency. They have been referred by Katheren Shams and is a new patient to me. Prior medical records have been reviewed carefully prior to this consultation and salient details have been summarized below.     Patient referred for evaluation for possible immune deficiency given history of low IgM (care everywhere) and difficult to treat MAC lung infection and bronchiectasis. She has had multiple rounds of treatment for MAC, with most recent antibiotic regimen including amikacin, azithromycin, ethambutol and rifampin.     Today, patient states she has been feeling a little bit more energy since this past weekend after starting new antibiotic regimen with amikacin a few weeks ago (started 08/17/20). Has felt very fatigued for the past few years. MAC diagnosed in 2018. She notes she has been having diarrhea since restarting the antibiotics, however she had only occasional diarrhea before antibiotics. No fevers or chills currently. Having night sweats off and on this past year. Having dry skin and some itching of elbows and arms but not of antecubital fossa and no rashes. Notes she had oral ulcers in 2021 and 2022 which were very painful.     She notes she does not have a history of sinus infections. No history of pneumonia before MAC was diagnosed. No history of asthma. No history of frequent skin infections.     General allergy history:  History of eczema: No  History of asthma: No  Food allergy: Walnuts -> tongue burning and swelling but has never needed an epi pen  Venom/bee sting allergy: No  Contact allergy/dermatitis: Gets itchy and red skin with wool    Sinus history:  History of  endoscopic/imaging confirmed sinusitis: No  Anosmia: No  Nasal trauma: No  History of nasal polyps: No  Aspirin sensitivity: No  Alcohol intolerance: No  Recurrent sinus infections (if yes, avg # per year): No    Asthma history:  Current medication regimen: using albuterol inhaler once a day, helps  Family history of asthma: No  Sleep apnea history: none    Environmental and social history:  Occupation: retired  Lives in English as a second language teacher: apartment  Born and raised in [place].  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    Allergies:  Allergies   Allergen Reactions    Psychologist, clinical Other       Medications:    Current Outpatient Medications:     albuterol 108 (90 Base) MCG/ACT inhaler, Inhale 2 puffs by mouth every 6 hours as needed for Wheezing., Disp: 3 each, Rfl: 3    amikacin (AMIKIN) 600 mg in dextrose 100 mL IVPB, Inject 600 mg into vein three times a week., Disp: , Rfl:  atorvastatin (LIPITOR) 10 MG tablet, Take 10 mg by mouth daily., Disp: , Rfl:     azithromycin (ZITHROMAX) 250 MG tablet, Take 1 tablet (250 mg) by mouth daily., Disp: 30 tablet, Rfl: 11    ethambutol (MYAMBUTOL) 400 MG tablet, Take 2 tablets (800 mg) by mouth daily., Disp: 60 tablet, Rfl: 11    fluoxetine (PROZAC) 20 MG tablet, Take 20 mg by mouth daily., Disp: , Rfl:     FLUOXETINE HCL PO, 30 mg, Disp: , Rfl:     metFORMIN (GLUCOPHAGE) 1000 mg tablet, Take 1,000 mg by mouth 2 times daily (with meals)., Disp: , Rfl:      OMEPRAZOLE PO, , Disp: , Rfl:     Pediatric Multivitamins-Fl (MULTIVITAMINS/FL PO), , Disp: , Rfl:     ramipril (ALTACE) 2.5 MG capsule, Take 2.5 mg by mouth daily., Disp: , Rfl:     rifampin (RIFADIN) 300 MG capsule, Take 2 capsules (600 mg) by mouth daily., Disp: 60 capsule, Rfl: 11    sodium chloride 7 % NEBU, Inhale 4 mL by mouth every 12 hours., Disp: , Rfl:     sodium chloride 7 % NEBU, 4 mL by Nebulization route every 12 hours., Disp: 240 mL, Rfl: 11    FH:   Paternal grandfather: TB  Mother: TB  Brother: environmental allergies, bronchitis, pneumonia, allergic rhinitis  Sister: seasonal allergies     Review of Systems:  General: No fevers, chills, weight loss, night sweats.  Neuro: No headaches, dizziness, visual changes, sensory deficit  HEENT: No runny nose, congestion, itchy, watery eyes, conjunctivitis, otalgia  Resp: +cough, no SOB, wheezing, chest tightness  CV: No murmur, palpitations, chest pain  GI: No vomiting, diarrhea, abdominal pain  Musc: No arthralgias, no joint swelling  Skin: No skin lesions or urticaria  Complete 14 point ROS negative except as outlined above.    Physical Exam:  BP 132/74    Pulse 78    Temp 97 F (36.1 C) (Temporal)    Resp 16    Ht 5' 2.5" (1.588 m)    Wt 57.2 kg (126 lb)    SpO2 96%    BMI 22.68 kg/m   Constitutional: WNWD, NAD  HEENT: NCAT, PERRLA, no conjunctivitis or cobblestoning, no allergic shiners, no frontal or maxillary sinus tenderness, TMs with normal landmarks  Inferior turbinates pink, minimal discharge, no gingivitis, OP clear without exudate/erythema or posterior cobblestoning  Neck: normal thyroid, trachea midline  Lymph: no cervical or axillary lymphadenopathy  Cardiovascular: RRR no murmur, no edema  Respiratory: no respiratory distress, CTAB no wheezing/rales/rhonchi, slightly decreased breath sounds at bilateral lung bases  Skin: no rashes or lesions  Ext: no clubbing, petechiae, cyanosis  Neuro/psych: alert, awake, oriented to time, place,  person; normal affect and insight    Impression:  75 year old here for evaluation for possible immune deficiency.     1. MAC Lung Infection  2. Bronchiectasis  3. History of low IgM  Patient presents for evaluation of possible immune deficiency. Patient had a low IgM reading 6 months ago, with most recent IgM normal at 53. Given no significant history of prior infections (no hx pna before MAC diagnosis, no hx frequent sinusitis or skin infections) lower suspicion for underlying immune deficiency. However, to fully rule out will check pneumococcal and tetanus titers. If low, will revaccinate and then reassess response of titers to vaccination to assess functionality of immune system.     Recommendations:  - check pneumococcal and tetanus titers  -  recheck immunoglobulin levels, lymphocyte subset panel    The plan of care was discussed with the patient who expressed agreement and understanding. Questions were answered prior to conclusion of the visit.    Patient seen and discussed with attending Dr. Maudie Mercury.    Billey Chang, MD  Internal Medicine, PGY3

## 2020-09-07 NOTE — Patient Instructions (Signed)
After visit summary:    Lab work today - I will follow-up with you on MyChart  We will decide on further action steps at that time

## 2020-09-08 ENCOUNTER — Telehealth (INDEPENDENT_AMBULATORY_CARE_PROVIDER_SITE_OTHER): Payer: Self-pay | Admitting: Critical Care Medicine

## 2020-09-08 ENCOUNTER — Other Ambulatory Visit
Admission: RE | Admit: 2020-09-08 | Discharge: 2020-09-08 | Disposition: A | Discharge status: Home / Self Care | Payer: Medicare Other | Attending: Critical Care Medicine | Admitting: Critical Care Medicine

## 2020-09-08 DIAGNOSIS — A310 Pulmonary mycobacterial infection: Secondary | ICD-10-CM

## 2020-09-08 DIAGNOSIS — A319 Mycobacterial infection, unspecified: Secondary | ICD-10-CM | POA: Insufficient documentation

## 2020-09-08 DIAGNOSIS — J984 Other disorders of lung: Secondary | ICD-10-CM | POA: Insufficient documentation

## 2020-09-08 DIAGNOSIS — J479 Bronchiectasis, uncomplicated: Secondary | ICD-10-CM | POA: Insufficient documentation

## 2020-09-08 LAB — CBC WITH DIFF, BLOOD
ANC-Automated: 5.5 10*3/uL (ref 1.6–7.0)
Abs Basophils: 0 10*3/uL (ref <0.2)
Abs Eosinophils: 0.2 10*3/uL (ref 0.0–0.5)
Abs Lymphs: 1.5 10*3/uL (ref 0.8–3.1)
Abs Monos: 0.5 10*3/uL (ref 0.2–0.8)
Basophils: 1 %
Eosinophils: 2 %
Hct: 36.2 % (ref 34.0–45.0)
Hgb: 11.7 gm/dL (ref 11.2–15.7)
Lymphocytes: 19 %
MCH: 28.5 pg (ref 26.0–32.0)
MCHC: 32.3 g/dL (ref 32.0–36.0)
MCV: 88.1 um3 (ref 79.0–95.0)
MPV: 10.4 fL (ref 9.4–12.4)
Monocytes: 7 %
Plt Count: 280 10*3/uL (ref 140–370)
RBC: 4.11 10*6/uL (ref 3.90–5.20)
RDW: 14.1 % — ABNORMAL HIGH (ref 12.0–14.0)
Segs: 71 %
WBC: 7.8 10*3/uL (ref 4.0–10.0)

## 2020-09-08 LAB — LYMPHOCYTES SUBSET PANEL, BLOOD
CD19 B-Cell %: 10 % (ref 6–23)
CD19 B-Cell Abs: 162 cells/uL (ref 50–350)
CD3+ T-Cell %: 80 % (ref 59–85)
CD3+ T-Cell Abs: 1229 cells/uL (ref 500–1800)
CD4+ T-Cell %: 58 % (ref 29–61)
CD4+ T-Cell Abs: 899 cells/uL (ref 250–1200)
CD4:CD8 Ratio: 2.71 (ref 0.70–4.00)
CD8+ T-Cell %: 22 % (ref 11–38)
CD8+ T-Cell Abs: 332 cells/uL (ref 100–800)
NK Cell Abs: 131 cell/uL (ref 50–500)
NK Cells %: 8 % (ref 5–31)

## 2020-09-08 LAB — COMPREHENSIVE METABOLIC PANEL, BLOOD
ALT (SGPT): 13 U/L (ref 0–33)
AST (SGOT): 20 U/L (ref 0–32)
Albumin: 4.1 g/dL (ref 3.5–5.2)
Alkaline Phos: 58 U/L (ref 40–130)
Anion Gap: 15 mmol/L (ref 7–15)
BUN: 13 mg/dL (ref 8–23)
Bicarbonate: 25 mmol/L (ref 22–29)
Bilirubin, Tot: 0.29 mg/dL (ref <1.2)
Calcium: 9.9 mg/dL (ref 8.5–10.6)
Chloride: 100 mmol/L (ref 98–107)
Creatinine: 0.5 mg/dL — ABNORMAL LOW (ref 0.51–0.95)
GFR: >60 mL/min
Glucose: 70 mg/dL (ref 70–99)
Potassium: 4.3 mmol/L (ref 3.5–5.1)
Sodium: 140 mmol/L (ref 136–145)
Total Protein: 6.8 g/dL (ref 6.0–8.0)
eGFR Based on CKD-EPI 2021 Equation: >60 mL/min

## 2020-09-08 LAB — AMIKACIN, PEAK: Amikacin, Peak: 20.8 ug/mL (ref 20.0–25.0)

## 2020-09-08 NOTE — Telephone Encounter (Signed)
Please advise 

## 2020-09-08 NOTE — Telephone Encounter (Signed)
Tesa from [facility] called to ask Dr. Rinaldo Ratel if the can hold Amikacin till next visit. They are close on Monday. She drawn on Mondays-Wednesday-Fridays. Please assist. Thank you.       P# [phone]

## 2020-09-10 ENCOUNTER — Encounter (INDEPENDENT_AMBULATORY_CARE_PROVIDER_SITE_OTHER): Payer: Self-pay | Admitting: Allergy

## 2020-09-11 LAB — PNEUMOCOCCAL ABS, IGG 23
Pneumococcal Serotype 10A IgG: 0.61 ug/mL
Pneumococcal Serotype 11A IgG: 2.93 ug/mL
Pneumococcal Serotype 15B IgG: 2.61 ug/mL
Pneumococcal Serotype 17F IgG: 0 ug/mL
Pneumococcal Serotype 19A IgG: 2.98 ug/mL
Pneumococcal Serotype 2 IgG: 0.52 ug/mL
Pneumococcal Serotype 20 IgG: 1.15 ug/mL
Pneumococcal Serotype 22F IgG: 13.11 ug/mL
Pneumococcal Serotype 33F IgG: 0.98 ug/mL
Serotyp 18C* IgG: 0.56 ug/mL
Serotyp 19F* IgG: 0.86 ug/mL
Serotyp 23F* IgG: 0.08 ug/mL
Serotype 1 IgG: 0.33 ug/mL
Serotype 12F IgG: 0.09 ug/mL
Serotype 14* IgG: 2.66 ug/mL
Serotype 3 IgG: 0.5 ug/mL
Serotype 4* IgG: 0.09 ug/mL
Serotype 5 IgG: 0.87 ug/mL
Serotype 6B* IgG: 0.21 ug/mL
Serotype 7F IgG: 0.73 ug/mL
Serotype 8 IgG: 0.17 ug/mL
Serotype 9N IgG: 0.1 ug/mL
Serotype 9V* IgG: 2.28 ug/mL

## 2020-09-11 LAB — TETANUS ANTIBODY, IGG, BLOOD: Tetanus AB, IgG: 6.3 [IU]/mL

## 2020-09-13 ENCOUNTER — Telehealth (INDEPENDENT_AMBULATORY_CARE_PROVIDER_SITE_OTHER): Payer: Self-pay | Admitting: Pulmonary Medicine

## 2020-09-13 ENCOUNTER — Other Ambulatory Visit
Admission: RE | Admit: 2020-09-13 | Discharge: 2020-09-13 | Disposition: A | Discharge status: Home / Self Care | Payer: Medicare Other | Attending: Critical Care Medicine | Admitting: Critical Care Medicine

## 2020-09-13 DIAGNOSIS — A312 Disseminated mycobacterium avium-intracellulare complex (DMAC): Secondary | ICD-10-CM | POA: Insufficient documentation

## 2020-09-13 DIAGNOSIS — J479 Bronchiectasis, uncomplicated: Secondary | ICD-10-CM | POA: Insufficient documentation

## 2020-09-13 LAB — CBC WITH DIFF, BLOOD
ANC-Automated: 3.9 10*3/uL (ref 1.6–7.0)
Abs Basophils: 0.1 10*3/uL (ref <0.2)
Abs Eosinophils: 0.3 10*3/uL (ref 0.0–0.5)
Abs Lymphs: 1.5 10*3/uL (ref 0.8–3.1)
Abs Monos: 0.5 10*3/uL (ref 0.2–0.8)
Basophils: 1 %
Eosinophils: 5 %
Hct: 35.4 % (ref 34.0–45.0)
Hgb: 10.9 gm/dL — ABNORMAL LOW (ref 11.2–15.7)
Lymphocytes: 24 %
MCH: 27.3 pg (ref 26.0–32.0)
MCHC: 30.8 g/dL — ABNORMAL LOW (ref 32.0–36.0)
MCV: 88.5 um3 (ref 79.0–95.0)
MPV: 10.3 fL (ref 9.4–12.4)
Monocytes: 8 %
Plt Count: 268 10*3/uL (ref 140–370)
RBC: 4 10*6/uL (ref 3.90–5.20)
RDW: 14.2 % — ABNORMAL HIGH (ref 12.0–14.0)
Segs: 62 %
WBC: 6.4 10*3/uL (ref 4.0–10.0)

## 2020-09-13 LAB — COMPREHENSIVE METABOLIC PANEL, BLOOD
ALT (SGPT): 10 U/L (ref 0–33)
AST (SGOT): 18 U/L (ref 0–32)
Albumin: 4.1 g/dL (ref 3.5–5.2)
Alkaline Phos: 55 U/L (ref 40–130)
Anion Gap: 10 mmol/L (ref 7–15)
BUN: 12 mg/dL (ref 8–23)
Bicarbonate: 27 mmol/L (ref 22–29)
Bilirubin, Tot: 0.28 mg/dL (ref <1.2)
Calcium: 9.7 mg/dL (ref 8.5–10.6)
Chloride: 100 mmol/L (ref 98–107)
Creatinine: 0.48 mg/dL — ABNORMAL LOW (ref 0.51–0.95)
GFR: >60 mL/min
Glucose: 57 mg/dL — CL (ref 70–99)
Potassium: 4.2 mmol/L (ref 3.5–5.1)
Sodium: 137 mmol/L (ref 136–145)
Total Protein: 6.6 g/dL (ref 6.0–8.0)
eGFR Based on CKD-EPI 2021 Equation: >60 mL/min

## 2020-09-13 LAB — AMIKACIN, PEAK: Amikacin, Peak: 22.3 ug/mL (ref 20.0–25.0)

## 2020-09-13 NOTE — Telephone Encounter (Signed)
Paged by [facility] that patient had a critical lab result of a BMP glucose of 57 at around 2pm today. Lab was ordered by Dr. Fransico Michael. I called the patient on both numbers 3 times but no answer, no identifying voicemail.  I left a non-identifying voicemail asking the patient to call us.

## 2020-09-14 ENCOUNTER — Telehealth (INDEPENDENT_AMBULATORY_CARE_PROVIDER_SITE_OTHER): Payer: Self-pay | Admitting: Pulmonary Medicine

## 2020-09-14 NOTE — Telephone Encounter (Addendum)
Dr. Zachery Dauer no longer with this department. POC for Dr. Zachery Dauer was Morrie Sheldon. Dr. Zachery Dauer may have been MD on call when page was received.    Patient sees Dr. Fransico Michael. Routing to his POC.

## 2020-09-14 NOTE — Telephone Encounter (Signed)
Routing to RN /MD     Paged by [facility] that patient had a critical lab result of a BMP glucose of 57 at around 2pm today. Lab was ordered by Dr. Fransico Michael. I called the patient on both numbers 3 times but no answer, no identifying voicemail.  I left a non-identifying voicemail asking the patient to call us.

## 2020-09-14 NOTE — Telephone Encounter (Signed)
Patient returned Dr Gwinda [place] call that was left on her voicemail last night.   Agent reached out Via Smart chat to [facility].  Dr was not available at the time of call.  Patient also requested to speak to [facility].  Reached out  Via smart chat and at the time of call there was no response.  Please contact the patient.  Thank you and please advise       [phone]

## 2020-09-14 NOTE — Telephone Encounter (Signed)
[facility] RN Triage Note:     MAC RN reached out to the patient.     Angela Nevin MSN, RN  [facility]  P: [phone]

## 2020-09-14 NOTE — Telephone Encounter (Signed)
MACRN: pt called  She received a call last that her blood sugar is low.   Today She is alert and appropriate. Denies any dizzyness or shaking. She has been eating well.      Noted 1 hour post IV Amikacin Blood draw -BS results.     Plan:  -she will have a snack during her infusion to avoid post bld draw low glucose.  -Next labs to be drawn -tomorow Friday 9/9

## 2020-09-15 ENCOUNTER — Other Ambulatory Visit: Payer: Medicare Other | Attending: Critical Care Medicine

## 2020-09-15 ENCOUNTER — Encounter (INDEPENDENT_AMBULATORY_CARE_PROVIDER_SITE_OTHER): Payer: Self-pay | Admitting: Allergy

## 2020-09-15 DIAGNOSIS — A310 Pulmonary mycobacterial infection: Secondary | ICD-10-CM | POA: Insufficient documentation

## 2020-09-15 DIAGNOSIS — Z23 Encounter for immunization: Secondary | ICD-10-CM

## 2020-09-15 DIAGNOSIS — J479 Bronchiectasis, uncomplicated: Secondary | ICD-10-CM

## 2020-09-15 DIAGNOSIS — A319 Mycobacterial infection, unspecified: Secondary | ICD-10-CM | POA: Insufficient documentation

## 2020-09-15 LAB — CBC WITH DIFF, BLOOD
ANC-Automated: 3.8 10*3/uL (ref 1.6–7.0)
Abs Basophils: 0 10*3/uL (ref <0.2)
Abs Eosinophils: 0.3 10*3/uL (ref 0.0–0.5)
Abs Lymphs: 1.5 10*3/uL (ref 0.8–3.1)
Abs Monos: 0.5 10*3/uL (ref 0.2–0.8)
Basophils: 1 %
Eosinophils: 5 %
Hct: 34.9 % (ref 34.0–45.0)
Hgb: 10.8 gm/dL — ABNORMAL LOW (ref 11.2–15.7)
Lymphocytes: 24 %
MCH: 27.1 pg (ref 26.0–32.0)
MCHC: 30.9 g/dL — ABNORMAL LOW (ref 32.0–36.0)
MCV: 87.7 um3 (ref 79.0–95.0)
MPV: 10.3 fL (ref 9.4–12.4)
Monocytes: 8 %
Plt Count: 262 10*3/uL (ref 140–370)
RBC: 3.98 10*6/uL (ref 3.90–5.20)
RDW: 14.2 % — ABNORMAL HIGH (ref 12.0–14.0)
Segs: 62 %
WBC: 6.2 10*3/uL (ref 4.0–10.0)

## 2020-09-15 LAB — COMPREHENSIVE METABOLIC PANEL, BLOOD
ALT (SGPT): 10 U/L (ref 0–33)
AST (SGOT): 14 U/L (ref 0–32)
Albumin: 4.2 g/dL (ref 3.5–5.2)
Alkaline Phos: 56 U/L (ref 40–130)
Anion Gap: 10 mmol/L (ref 7–15)
BUN: 12 mg/dL (ref 8–23)
Bicarbonate: 27 mmol/L (ref 22–29)
Bilirubin, Tot: 0.29 mg/dL (ref <1.2)
Calcium: 9.6 mg/dL (ref 8.5–10.6)
Chloride: 101 mmol/L (ref 98–107)
Creatinine: 0.5 mg/dL — ABNORMAL LOW (ref 0.51–0.95)
GFR: >60 mL/min
Glucose: 91 mg/dL (ref 70–99)
Potassium: 4.3 mmol/L (ref 3.5–5.1)
Sodium: 138 mmol/L (ref 136–145)
Total Protein: 6.8 g/dL (ref 6.0–8.0)
eGFR Based on CKD-EPI 2021 Equation: >60 mL/min

## 2020-09-15 LAB — AMIKACIN, PEAK: Amikacin, Peak: 28.3 ug/mL — ABNORMAL HIGH (ref 20.0–25.0)

## 2020-09-18 ENCOUNTER — Other Ambulatory Visit
Admission: RE | Admit: 2020-09-18 | Discharge: 2020-09-18 | Disposition: A | Discharge status: Home / Self Care | Payer: Medicare Other | Attending: Critical Care Medicine | Admitting: Critical Care Medicine

## 2020-09-18 DIAGNOSIS — A310 Pulmonary mycobacterial infection: Secondary | ICD-10-CM | POA: Insufficient documentation

## 2020-09-18 DIAGNOSIS — A319 Mycobacterial infection, unspecified: Secondary | ICD-10-CM

## 2020-09-18 LAB — CBC WITH DIFF, BLOOD
ANC-Automated: 2.8 10*3/uL (ref 1.6–7.0)
Abs Basophils: 0 10*3/uL (ref <0.2)
Abs Eosinophils: 0.2 10*3/uL (ref 0.0–0.5)
Abs Lymphs: 1.3 10*3/uL (ref 0.8–3.1)
Abs Monos: 0.4 10*3/uL (ref 0.2–0.8)
Basophils: 1 %
Eosinophils: 5 %
Hct: 33.4 % — ABNORMAL LOW (ref 34.0–45.0)
Hgb: 11 gm/dL — ABNORMAL LOW (ref 11.2–15.7)
Lymphocytes: 27 %
MCH: 28.5 pg (ref 26.0–32.0)
MCHC: 32.9 g/dL (ref 32.0–36.0)
MCV: 86.5 um3 (ref 79.0–95.0)
MPV: 10.3 fL (ref 9.4–12.4)
Monocytes: 9 %
Plt Count: 241 10*3/uL (ref 140–370)
RBC: 3.86 10*6/uL — ABNORMAL LOW (ref 3.90–5.20)
RDW: 14.4 % — ABNORMAL HIGH (ref 12.0–14.0)
Segs: 58 %
WBC: 4.8 10*3/uL (ref 4.0–10.0)

## 2020-09-18 LAB — COMPREHENSIVE METABOLIC PANEL, BLOOD
ALT (SGPT): 9 U/L (ref 0–33)
AST (SGOT): 18 U/L (ref 0–32)
Albumin: 4.1 g/dL (ref 3.5–5.2)
Alkaline Phos: 56 U/L (ref 40–130)
Anion Gap: 13 mmol/L (ref 7–15)
BUN: 14 mg/dL (ref 8–23)
Bicarbonate: 26 mmol/L (ref 22–29)
Bilirubin, Tot: 0.21 mg/dL (ref <1.2)
Calcium: 9.7 mg/dL (ref 8.5–10.6)
Chloride: 103 mmol/L (ref 98–107)
Creatinine: 0.44 mg/dL — ABNORMAL LOW (ref 0.51–0.95)
GFR: >60 mL/min
Glucose: 86 mg/dL (ref 70–99)
Potassium: 4.2 mmol/L (ref 3.5–5.1)
Sodium: 142 mmol/L (ref 136–145)
Total Protein: 6.4 g/dL (ref 6.0–8.0)
eGFR Based on CKD-EPI 2021 Equation: >60 mL/min

## 2020-09-18 LAB — AMIKACIN, PEAK: Amikacin, Peak: 19 ug/mL — ABNORMAL LOW (ref 20.0–25.0)

## 2020-09-18 NOTE — Telephone Encounter (Signed)
Called patient, LVM to call office to schedule Pneumovax-23 and titers. LVM to call office to schedule.

## 2020-09-19 ENCOUNTER — Telehealth (HOSPITAL_BASED_OUTPATIENT_CLINIC_OR_DEPARTMENT_OTHER): Payer: Self-pay | Admitting: Critical Care Medicine

## 2020-09-19 ENCOUNTER — Ambulatory Visit (INDEPENDENT_AMBULATORY_CARE_PROVIDER_SITE_OTHER): Payer: Medicare Other | Admitting: Audiology

## 2020-09-19 ENCOUNTER — Telehealth (INDEPENDENT_AMBULATORY_CARE_PROVIDER_SITE_OTHER): Payer: Self-pay | Admitting: Allergy

## 2020-09-19 DIAGNOSIS — H903 Sensorineural hearing loss, bilateral: Secondary | ICD-10-CM

## 2020-09-19 DIAGNOSIS — H9313 Tinnitus, bilateral: Secondary | ICD-10-CM

## 2020-09-19 NOTE — Progress Notes (Signed)
AUDIOMETRIC ASSESSMENT    History:  Tiffany Chen, a 75 year old female, was referred by Dr. Fransico Michael, [facility]. She has an established history of ototoxic exposure and is being actively monitored for possible hearing loss. Patient had concerns including Hearing Loss (No subjective change in hearing) and Ear Problem (Feels that the left ear is "popping" since her ear was cleaned, also noted that buzzing tinnitus has worsened).    Otoscopy:  Otoscopy revealed clear canals and tympanic membranes visualized bilaterally.    Objective Results:  Tympanometry revealed normal tympanogram (Type A, normal compliance, peak pressure, and ear canal volume) for both ears.      Subjective Results:  Pure tone testing revealed normal low frequency hearing sensitivity sloping to a moderate sensorineural hearing loss bilaterally.      Based on air-conduction threshold testing, estimated speech intelligibility index (SII) was 75% for the right ear and 72% for the left ear. SII is not a direct measure of speech intelligibility, but is often used predictor of functional performance.    Speech audiometry revealed very good word recognition (92%) in the right ear and excellent word recognition (100%) in the left ear. Speech reception thresholds (SRTs) were consistent with pure tone averages (PTAs), indicating good reliability.    Ultra-high frequencies using circumaural headphones were tested.   Frequency (kHz)   Ear 9.0 10.0 11.25 12.5 14.0 16.0   Right 65 70 70 70 80 NR@60    Left 60 65 70 70 75 NR@60      Impression:  Normal low frequency hearing sensitivity sloping to a moderate sensorineural hearing loss bilaterally.    Compared to previous audiograms, stable hearing sensitivity bilaterally. See table below.      Frequency (kHz)                 Ear  Date  0.125 0.25 0.5 0.75 1 1.5 2 3 4 6 8 9 10  11.2 12.5 14 16    Right  08/23/2020   5 10  15 20 30  40 35 40 45 60 65 70 75 NR@80  NR@60    Right  09/06/2020   0 10  15 20 30  40 35 40 50 55  65 70 75 NR@80  NR@60    Right  09/19/2020  10 5 5 10 15 20 30  35 35 40 50 65 70 70 70 80 NR@60                           Left  08/23/2020   5 10  10 25 30  40 40 45 50 65 65 65 75 80 NR@60    Left  09/06/2020   5 15  15 25  35 40 45 45 55 55 70 75 80 NR@80  NR@60    Left  09/19/2020  15 10 15 15 15 25 30  40 40 45 50 60 65 70 70 75 NR@60      Plan:  Patient to follow-up with Dr. 08/25/2020, regarding today's findings. Patient to return for continued ototoxic monitoring.    Audiogram viewable in EPIC "Media" tab.    09/08/2020, Au.D., CCC-A  Senior Audiologist  [facility]  Otolaryngology / Head & Neck Surgery and Audiology

## 2020-09-19 NOTE — Telephone Encounter (Signed)
Called patient, LVM to call office to schedule Pneumovax-23and titers

## 2020-09-19 NOTE — Telephone Encounter (Signed)
Pt is calling to schedule  Pneumovax-23 and titers.  Pt is requesting a call back this morning.     Please assist. Thank you.     CB# [phone]

## 2020-09-19 NOTE — Telephone Encounter (Signed)
Faxed to [facility] as requested

## 2020-09-19 NOTE — Telephone Encounter (Signed)
Tiffany Chen from [facility] called requesting lab results from 8/22 till most recent as of yesterday 09/18/20 to be faxed to them. Please assist. Thank you    Tiffany Chen asked to be sent as urgent.    P#[phone]  [facility]  F#[phone]  [facility] [phone]

## 2020-09-20 ENCOUNTER — Telehealth (HOSPITAL_BASED_OUTPATIENT_CLINIC_OR_DEPARTMENT_OTHER): Payer: Self-pay

## 2020-09-20 NOTE — Addendum Note (Signed)
Addended by: Rockie Neighbours on: 09/20/2020 06:21 PM     Modules accepted: Orders, SmartSet

## 2020-09-20 NOTE — Telephone Encounter (Signed)
Re-faxed as requested to both faxes    [facility] Fax#: [phone]  [facility] Fax#: [phone]

## 2020-09-20 NOTE — Telephone Encounter (Signed)
Tiffany Chen, from [facility], is calling in regards to request below. Agent relayed information that it was faxed yesterday at 408PM, but per [facility], they have not received it. Tiffany Chen is asking for clinic to refax to [facility] and to [facility]. Please advise, thank you.    [facility] Fax#: [phone]  [facility] Fax#: [phone]

## 2020-09-20 NOTE — Telephone Encounter (Signed)
MACRN: Shikara, Mcauliffe MR# [id number] IV Amikacin x 28months   Start date: 08/14/2020  End date: Nov    /DATE/ WBC HgB PLT Creatinine AST ALT Amikacin PEAK     08/16/20 7.4 11.2 273 0.55 18 11 24.9     08/18/20 8.2 12.3 249 0.58 18 13 20.4     08/21/20 10.1 12.6 280 0.55 19 14 Not run     08/25/2020 6.9 11.8 292 0.52 23 14 25.2     08/28/20 6.6 11.0 287 0.50 17 11 19.9     09/01/20 7.8 11.7 287 0.48 21 12 20.7     09/04/20 5.6 10.8 259 0.50 18 14 19.9     09/08/20 7.8 11.7 280 0.50 20 13 20.8     09/13/20 6.4 10.9 268 0.48 18 10 22.3     09/15/20 6.2 10.8 262 0.50 14 10 28.3     09/18/20 4.8 11.0 241 0.44 18 9 19.0

## 2020-09-21 NOTE — Telephone Encounter (Signed)
Stacy from [facility] called to verify if there will be any changes to pt's IV antibiotic dose. Kennyth Arnold would like to know if dose  will be kept the same or will clinic be reassessing with next labs based on labs from 09/18/20. Please assist. Thank you.     Best callback Ph.  [phone]    Is it okay to leave a message? Okay to speak with any pharmacist

## 2020-09-21 NOTE — Telephone Encounter (Signed)
Could a nurse call Stacy from [facility] back.     Points:   - the amikacin peak is lower than goal. HOWEVER, can we confirm if the timing of the blood draw was correct. It is supposed to be exactly one hour after the completion of the amikacin infusion.   - last week the amikacin level was 28 and this time it was 19. This is a huge fluctuation so it makes me suspect that the blood draw timing was not the same.   - for the above reasons, no change in the amikacin for now.

## 2020-09-22 ENCOUNTER — Encounter (INDEPENDENT_AMBULATORY_CARE_PROVIDER_SITE_OTHER): Payer: Self-pay | Admitting: Pulmonary Medicine

## 2020-09-22 ENCOUNTER — Other Ambulatory Visit
Admission: RE | Admit: 2020-09-22 | Discharge: 2020-09-22 | Disposition: A | Discharge status: Home / Self Care | Payer: Medicare Other | Attending: Critical Care Medicine | Admitting: Critical Care Medicine

## 2020-09-22 ENCOUNTER — Ambulatory Visit (INDEPENDENT_AMBULATORY_CARE_PROVIDER_SITE_OTHER): Payer: Medicare Other

## 2020-09-22 DIAGNOSIS — Z23 Encounter for immunization: Secondary | ICD-10-CM

## 2020-09-22 DIAGNOSIS — A310 Pulmonary mycobacterial infection: Secondary | ICD-10-CM | POA: Insufficient documentation

## 2020-09-22 DIAGNOSIS — J479 Bronchiectasis, uncomplicated: Secondary | ICD-10-CM

## 2020-09-22 DIAGNOSIS — A319 Mycobacterial infection, unspecified: Secondary | ICD-10-CM | POA: Insufficient documentation

## 2020-09-22 LAB — CBC WITH DIFF, BLOOD
ANC-Automated: 5.1 10*3/uL (ref 1.6–7.0)
Abs Basophils: 0.1 10*3/uL (ref <0.2)
Abs Eosinophils: 0.4 10*3/uL (ref 0.0–0.5)
Abs Lymphs: 1.6 10*3/uL (ref 0.8–3.1)
Abs Monos: 0.6 10*3/uL (ref 0.2–0.8)
Basophils: 1 %
Eosinophils: 5 %
Hct: 35.3 % (ref 34.0–45.0)
Hgb: 11.7 gm/dL (ref 11.2–15.7)
Lymphocytes: 21 %
MCH: 28.3 pg (ref 26.0–32.0)
MCHC: 33.1 g/dL (ref 32.0–36.0)
MCV: 85.5 um3 (ref 79.0–95.0)
MPV: 10.6 fL (ref 9.4–12.4)
Monocytes: 8 %
Plt Count: 298 10*3/uL (ref 140–370)
RBC: 4.13 10*6/uL (ref 3.90–5.20)
RDW: 13.9 % (ref 12.0–14.0)
Segs: 66 %
WBC: 7.7 10*3/uL (ref 4.0–10.0)

## 2020-09-22 LAB — COMPREHENSIVE METABOLIC PANEL, BLOOD
ALT (SGPT): 9 U/L (ref 0–33)
AST (SGOT): 18 U/L (ref 0–32)
Albumin: 4.4 g/dL (ref 3.5–5.2)
Alkaline Phos: 60 U/L (ref 40–130)
Anion Gap: 15 mmol/L (ref 7–15)
BUN: 15 mg/dL (ref 8–23)
Bicarbonate: 24 mmol/L (ref 22–29)
Bilirubin, Tot: 0.4 mg/dL (ref <1.2)
Calcium: 9.9 mg/dL (ref 8.5–10.6)
Chloride: 101 mmol/L (ref 98–107)
Creatinine: 0.49 mg/dL — ABNORMAL LOW (ref 0.51–0.95)
GFR: >60 mL/min
Glucose: 84 mg/dL (ref 70–99)
Potassium: 4.4 mmol/L (ref 3.5–5.1)
Sodium: 140 mmol/L (ref 136–145)
Total Protein: 7.2 g/dL (ref 6.0–8.0)
eGFR Based on CKD-EPI 2021 Equation: >60 mL/min

## 2020-09-22 LAB — AMIKACIN, PEAK: Amikacin, Peak: 23.1 ug/mL (ref 20.0–25.0)

## 2020-09-22 NOTE — Interdisciplinary (Signed)
Patient in clinic for Pneum23 vaccine, had labs on 09/07/20, patient aware she needs lab titters in 5 weeks. aske dher to make appointment here or go to [facility] lab, orders are in place. Patient verbalized understanding.    Administered Penum23 vaccine right deltoid, documented.

## 2020-09-22 NOTE — Telephone Encounter (Signed)
Spoke to [place], she was informed as per Dr. Anabel Bene:        Misty Stanley checked the times treatment was finished and when the lab drawn was done, and she states timing was not exactly one hour.  Pt. Finished tx at around 10:00-10:15, lab draw was at 11:35.  Misty Stanley v/u instructions and informs pt is schedule for another blood drawn today.     Leatrice Parilla Z. RN  [facility]

## 2020-09-25 ENCOUNTER — Telehealth (HOSPITAL_BASED_OUTPATIENT_CLINIC_OR_DEPARTMENT_OTHER): Payer: Self-pay | Admitting: Nurse Practitioner

## 2020-09-25 ENCOUNTER — Other Ambulatory Visit
Admission: RE | Admit: 2020-09-25 | Discharge: 2020-09-25 | Disposition: A | Discharge status: Home / Self Care | Payer: Medicare Other | Attending: Critical Care Medicine | Admitting: Critical Care Medicine

## 2020-09-25 DIAGNOSIS — A319 Mycobacterial infection, unspecified: Secondary | ICD-10-CM | POA: Insufficient documentation

## 2020-09-25 DIAGNOSIS — A310 Pulmonary mycobacterial infection: Secondary | ICD-10-CM

## 2020-09-25 LAB — CBC WITH DIFF, BLOOD
ANC-Automated: 4.5 10*3/uL (ref 1.6–7.0)
Abs Basophils: 0.1 10*3/uL (ref <0.2)
Abs Eosinophils: 0.2 10*3/uL (ref 0.0–0.5)
Abs Lymphs: 1.5 10*3/uL (ref 0.8–3.1)
Abs Monos: 0.5 10*3/uL (ref 0.2–0.8)
Basophils: 1 %
Eosinophils: 3 %
Hct: 36.6 % (ref 34.0–45.0)
Hgb: 11.8 gm/dL (ref 11.2–15.7)
Lymphocytes: 22 %
MCH: 28.2 pg (ref 26.0–32.0)
MCHC: 32.2 g/dL (ref 32.0–36.0)
MCV: 87.4 um3 (ref 79.0–95.0)
MPV: 10.3 fL (ref 9.4–12.4)
Monocytes: 7 %
Plt Count: 297 10*3/uL (ref 140–370)
RBC: 4.19 10*6/uL (ref 3.90–5.20)
RDW: 13.9 % (ref 12.0–14.0)
Segs: 67 %
WBC: 6.8 10*3/uL (ref 4.0–10.0)

## 2020-09-25 LAB — COMPREHENSIVE METABOLIC PANEL, BLOOD
ALT (SGPT): 11 U/L (ref 0–33)
AST (SGOT): 22 U/L (ref 0–32)
Albumin: 4.3 g/dL (ref 3.5–5.2)
Alkaline Phos: 56 U/L (ref 40–130)
Anion Gap: 12 mmol/L (ref 7–15)
BUN: 15 mg/dL (ref 8–23)
Bicarbonate: 26 mmol/L (ref 22–29)
Bilirubin, Tot: 0.19 mg/dL (ref <1.2)
Calcium: 9.9 mg/dL (ref 8.5–10.6)
Chloride: 99 mmol/L (ref 98–107)
Creatinine: 0.47 mg/dL — ABNORMAL LOW (ref 0.51–0.95)
GFR: >60 mL/min
Glucose: 142 mg/dL — ABNORMAL HIGH (ref 70–99)
Potassium: 4.4 mmol/L (ref 3.5–5.1)
Sodium: 137 mmol/L (ref 136–145)
Total Protein: 6.8 g/dL (ref 6.0–8.0)
eGFR Based on CKD-EPI 2021 Equation: >60 mL/min

## 2020-09-25 LAB — AMIKACIN, PEAK: Amikacin, Peak: 19.4 ug/mL — ABNORMAL LOW (ref 20.0–25.0)

## 2020-09-25 NOTE — Telephone Encounter (Signed)
Faxed with receipt confirmation.

## 2020-09-25 NOTE — Telephone Encounter (Signed)
Belen from [facility] calling in to request lab results collected on 9/16. Belen requesting results be faxed to both [facility] and [facility] at both fax numbers provided below.   Please assist, Thank you.     Jomarie Longs ph#: [phone]  [facility] fax#: [phone]  [facility] fax#: [phone]

## 2020-09-26 ENCOUNTER — Telehealth (HOSPITAL_BASED_OUTPATIENT_CLINIC_OR_DEPARTMENT_OTHER): Payer: Self-pay

## 2020-09-26 DIAGNOSIS — A319 Mycobacterial infection, unspecified: Secondary | ICD-10-CM

## 2020-09-26 LAB — AFB CULTURE W/STAIN: AFB Smear Result: NEGATIVE

## 2020-09-26 NOTE — Telephone Encounter (Signed)
Julian Hy has called from [facility] returning missed call regarding labs.  Best call back ph# [phone]

## 2020-09-26 NOTE — Telephone Encounter (Signed)
MACRN:  Called [facility] regarding labs 09/25/20 Amikacin Peak low   Ph.  [phone]  Called [facility] [phone] -left message        /DATE/ WBC HgB PLT Creatinine AST ALT Amikacin PEAK    08/16/20 7.4 11.2 273 0.55 18 11 24.9    08/18/20 8.2 12.3 249 0.58 18 13 20.4    08/21/20 10.1 12.6 280 0.55 19 14 Not run    08/25/2020 6.9 11.8 292 0.52 23 14 25.2    08/28/20 6.6 11.0 287 0.50 17 11 19.9    09/01/20 7.8 11.7 287 0.48 21 12 20.7    09/04/20 5.6 10.8 259 0.50 18 14 19.9    09/08/20 7.8 11.7 280 0.50 20 13 20.8    09/13/20 6.4 10.9 268 0.48 18 10 22.3    09/15/20 6.2 10.8 262 0.50 14 10 28.3    09/18/20 4.8 11.0 241 0.44 18 9 19.0    09/22/20 7.7 11.7 298 0.49 18 9 23.1    09/25/20 6.8 11.8 297 0.47 22 11 19.4

## 2020-09-28 NOTE — Telephone Encounter (Signed)
MACRN:  I called Retail banker at [employer] [facility]  Blood drawn was 1 hr 15 mins post infusion on 9/19    Amikacin Peak level was 19.4    Leave dose for now-wait till tomorrows infusion and 1 hr post blood draw.     Importance of labs Amikacin Peak 1 hr post infusion discussed.

## 2020-09-29 ENCOUNTER — Other Ambulatory Visit
Admission: RE | Admit: 2020-09-29 | Discharge: 2020-09-29 | Disposition: A | Discharge status: Home / Self Care | Payer: Medicare Other | Attending: Critical Care Medicine | Admitting: Critical Care Medicine

## 2020-09-29 DIAGNOSIS — J479 Bronchiectasis, uncomplicated: Secondary | ICD-10-CM | POA: Insufficient documentation

## 2020-09-29 DIAGNOSIS — J984 Other disorders of lung: Secondary | ICD-10-CM | POA: Insufficient documentation

## 2020-09-29 DIAGNOSIS — A310 Pulmonary mycobacterial infection: Secondary | ICD-10-CM

## 2020-09-29 LAB — CBC WITH DIFF, BLOOD
ANC-Automated: 3.7 10*3/uL (ref 1.6–7.0)
Abs Basophils: 0.1 10*3/uL (ref <0.2)
Abs Eosinophils: 0.3 10*3/uL (ref 0.0–0.5)
Abs Lymphs: 1.6 10*3/uL (ref 0.8–3.1)
Abs Monos: 0.5 10*3/uL (ref 0.2–0.8)
Basophils: 1 %
Eosinophils: 4 %
Hct: 33.4 % — ABNORMAL LOW (ref 34.0–45.0)
Hgb: 10.6 gm/dL — ABNORMAL LOW (ref 11.2–15.7)
Lymphocytes: 26 %
MCH: 27.7 pg (ref 26.0–32.0)
MCHC: 31.7 g/dL — ABNORMAL LOW (ref 32.0–36.0)
MCV: 87.4 um3 (ref 79.0–95.0)
MPV: 11 fL (ref 9.4–12.4)
Monocytes: 9 %
Plt Count: 261 10*3/uL (ref 140–370)
RBC: 3.82 10*6/uL — ABNORMAL LOW (ref 3.90–5.20)
RDW: 13.9 % (ref 12.0–14.0)
Segs: 60 %
WBC: 6.2 10*3/uL (ref 4.0–10.0)

## 2020-09-29 LAB — COMPREHENSIVE METABOLIC PANEL, BLOOD
ALT (SGPT): 12 U/L (ref 0–33)
AST (SGOT): 16 U/L (ref 0–32)
Albumin: 4.2 g/dL (ref 3.5–5.2)
Alkaline Phos: 54 U/L (ref 40–130)
Anion Gap: 12 mmol/L (ref 7–15)
BUN: 16 mg/dL (ref 8–23)
Bicarbonate: 27 mmol/L (ref 22–29)
Bilirubin, Tot: 0.32 mg/dL (ref <1.2)
Calcium: 9.6 mg/dL (ref 8.5–10.6)
Chloride: 100 mmol/L (ref 98–107)
Creatinine: 0.45 mg/dL — ABNORMAL LOW (ref 0.51–0.95)
GFR: >60 mL/min
Glucose: 63 mg/dL — ABNORMAL LOW (ref 70–99)
Potassium: 4.3 mmol/L (ref 3.5–5.1)
Sodium: 139 mmol/L (ref 136–145)
Total Protein: 6.7 g/dL (ref 6.0–8.0)
eGFR Based on CKD-EPI 2021 Equation: >60 mL/min

## 2020-09-29 LAB — AMIKACIN, PEAK: Amikacin, Peak: 23.3 ug/mL (ref 20.0–25.0)

## 2020-10-02 ENCOUNTER — Other Ambulatory Visit
Admission: RE | Admit: 2020-10-02 | Discharge: 2020-10-02 | Disposition: A | Discharge status: Home / Self Care | Payer: Medicare Other | Attending: Critical Care Medicine | Admitting: Critical Care Medicine

## 2020-10-02 ENCOUNTER — Telehealth (HOSPITAL_BASED_OUTPATIENT_CLINIC_OR_DEPARTMENT_OTHER): Payer: Self-pay | Admitting: Nurse Practitioner

## 2020-10-02 DIAGNOSIS — A319 Mycobacterial infection, unspecified: Secondary | ICD-10-CM | POA: Insufficient documentation

## 2020-10-02 DIAGNOSIS — A310 Pulmonary mycobacterial infection: Secondary | ICD-10-CM | POA: Insufficient documentation

## 2020-10-02 LAB — COMPREHENSIVE METABOLIC PANEL, BLOOD
ALT (SGPT): 13 U/L (ref 0–33)
AST (SGOT): 21 U/L (ref 0–32)
Albumin: 4.1 g/dL (ref 3.5–5.2)
Alkaline Phos: 53 U/L (ref 40–130)
Anion Gap: 11 mmol/L (ref 7–15)
BUN: 14 mg/dL (ref 8–23)
Bicarbonate: 26 mmol/L (ref 22–29)
Bilirubin, Tot: 0.3 mg/dL (ref <1.2)
Calcium: 9.2 mg/dL (ref 8.5–10.6)
Chloride: 99 mmol/L (ref 98–107)
Creatinine: 0.43 mg/dL — ABNORMAL LOW (ref 0.51–0.95)
GFR: >60 mL/min
Glucose: 111 mg/dL — ABNORMAL HIGH (ref 70–99)
Potassium: 4.5 mmol/L (ref 3.5–5.1)
Sodium: 136 mmol/L (ref 136–145)
Total Protein: 6.6 g/dL (ref 6.0–8.0)
eGFR Based on CKD-EPI 2021 Equation: >60 mL/min

## 2020-10-02 LAB — CBC WITH DIFF, BLOOD
ANC-Automated: 3.4 10*3/uL (ref 1.6–7.0)
Abs Basophils: 0.1 10*3/uL (ref <0.2)
Abs Eosinophils: 0.3 10*3/uL (ref 0.0–0.5)
Abs Lymphs: 1.4 10*3/uL (ref 0.8–3.1)
Abs Monos: 0.6 10*3/uL (ref 0.2–0.8)
Basophils: 1 %
Eosinophils: 4 %
Hct: 33.9 % — ABNORMAL LOW (ref 34.0–45.0)
Hgb: 10.8 gm/dL — ABNORMAL LOW (ref 11.2–15.7)
Lymphocytes: 25 %
MCH: 27.8 pg (ref 26.0–32.0)
MCHC: 31.9 g/dL — ABNORMAL LOW (ref 32.0–36.0)
MCV: 87.1 um3 (ref 79.0–95.0)
MPV: 10.8 fL (ref 9.4–12.4)
Monocytes: 10 %
Plt Count: 276 10*3/uL (ref 140–370)
RBC: 3.89 10*6/uL — ABNORMAL LOW (ref 3.90–5.20)
RDW: 14 % (ref 12.0–14.0)
Segs: 60 %
WBC: 5.8 10*3/uL (ref 4.0–10.0)

## 2020-10-02 LAB — AMIKACIN, PEAK: Amikacin, Peak: 23 ug/mL (ref 20.0–25.0)

## 2020-10-02 NOTE — Telephone Encounter (Signed)
Faxed as requested to both numbers provided by Tesa

## 2020-10-02 NOTE — Telephone Encounter (Signed)
Tesa from [facility] called stating that lab results were faxed to [phone]. She would like to confirm if they were received and if so please fax them to both fax numbers listed below. Thank you    [phone]  P#[phone]    [facility] [phone]

## 2020-10-04 ENCOUNTER — Ambulatory Visit (INDEPENDENT_AMBULATORY_CARE_PROVIDER_SITE_OTHER): Payer: Medicare Other | Admitting: Audiology

## 2020-10-04 ENCOUNTER — Telehealth (HOSPITAL_BASED_OUTPATIENT_CLINIC_OR_DEPARTMENT_OTHER): Payer: Self-pay | Admitting: Critical Care Medicine

## 2020-10-04 DIAGNOSIS — H903 Sensorineural hearing loss, bilateral: Secondary | ICD-10-CM

## 2020-10-04 DIAGNOSIS — H9313 Tinnitus, bilateral: Secondary | ICD-10-CM

## 2020-10-04 LAB — AFB CULTURE W/STAIN

## 2020-10-04 NOTE — Progress Notes (Signed)
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  She is also on azithromycin, Ethambutol, and Rifampin.  She reported the hearing is holding steady.  She reported no change in her bilateral, fluctuating tinnitus that was present before treatment started.  Denied dizziness, but stated she gets up carefully.  Denied aural pain/pressure/drainage.    Otoscopy  Unremarkable for either ear.    Results  Testing for both ears indicated normal hearing [phone], sloping from very mild to moderate sensorineural hearing loss 02-7998 Hz.  No asymmetries were present.  Speech discrimination was excellent (100%) bilaterally at normal presentation levels.    Ultrahigh frequencies showed moderately-severe to severe hearing loss 9-16,000 Hz.    DNT immittance as patient was non-symptomatic for middle ear pathology.    Comparison to previous audio done 09/19/20 showed stable threshold acuity for both ears.  Comparison to her first monitoring audio done 08/23/20 showed stable acuity as well.    Recommendations  Patient to f/u with Dr. Katheren Shams early October 2022 to determine if she'll be staying on the intravenous amikacin.  Further monitoring audiograms are scheduled.    Audiogram graph is available for view under media.    Orrin Brigham, Cletis Athens., CCC/A  Sr. Audiologist  Licensed Dispensing Audiologist  [facility]

## 2020-10-04 NOTE — Telephone Encounter (Signed)
Tesa from [facility] called to inform Dr. Seward Carol that pt's picc line came out about 3cm. She just wanted to notify MD and requesting call back if needed. Thank you    [phone]

## 2020-10-05 ENCOUNTER — Ambulatory Visit (INDEPENDENT_AMBULATORY_CARE_PROVIDER_SITE_OTHER): Payer: Medicare Other | Admitting: Audiology

## 2020-10-05 NOTE — Telephone Encounter (Signed)
[facility] RN Triage Note:     RN spoke with [facility] nurse-- and reported that during PICC dressing change, the PICC came out 3 cm more. Last time it was changed, there was 1 cm came out. A total of 4 cm has been out since the last change.     Total length 36cm- No noted clog, or resistance during flushing and medication administration. RN advise to keep monitoring the site and length of the central line and update the clinic for any further changes.    No further action needed at this time.     Lucio Edward MSN, RN  [facility]  P: [phone]

## 2020-10-06 ENCOUNTER — Other Ambulatory Visit: Payer: Medicare Other | Attending: Critical Care Medicine

## 2020-10-06 ENCOUNTER — Telehealth (HOSPITAL_BASED_OUTPATIENT_CLINIC_OR_DEPARTMENT_OTHER): Payer: Self-pay | Admitting: Critical Care Medicine

## 2020-10-06 DIAGNOSIS — A310 Pulmonary mycobacterial infection: Secondary | ICD-10-CM | POA: Insufficient documentation

## 2020-10-06 DIAGNOSIS — A319 Mycobacterial infection, unspecified: Secondary | ICD-10-CM | POA: Insufficient documentation

## 2020-10-06 LAB — CBC WITH DIFF, BLOOD
ANC-Automated: 5 10*3/uL (ref 1.6–7.0)
Abs Basophils: 0 10*3/uL (ref <0.2)
Abs Eosinophils: 0.4 10*3/uL (ref 0.0–0.5)
Abs Lymphs: 1.6 10*3/uL (ref 0.8–3.1)
Abs Monos: 0.6 10*3/uL (ref 0.2–0.8)
Basophils: 0 %
Eosinophils: 5 %
Hct: 34.1 % (ref 34.0–45.0)
Hgb: 10.8 gm/dL — ABNORMAL LOW (ref 11.2–15.7)
Lymphocytes: 21 %
MCH: 27.8 pg (ref 26.0–32.0)
MCHC: 31.7 g/dL — ABNORMAL LOW (ref 32.0–36.0)
MCV: 87.9 um3 (ref 79.0–95.0)
MPV: 10.8 fL (ref 9.4–12.4)
Monocytes: 8 %
Plt Count: 280 10*3/uL (ref 140–370)
RBC: 3.88 10*6/uL — ABNORMAL LOW (ref 3.90–5.20)
RDW: 13.8 % (ref 12.0–14.0)
Segs: 65 %
WBC: 7.8 10*3/uL (ref 4.0–10.0)

## 2020-10-06 LAB — COMPREHENSIVE METABOLIC PANEL, BLOOD
ALT (SGPT): 13 U/L (ref 0–33)
AST (SGOT): 17 U/L (ref 0–32)
Albumin: 4.3 g/dL (ref 3.5–5.2)
Alkaline Phos: 60 U/L (ref 40–130)
Anion Gap: 12 mmol/L (ref 7–15)
BUN: 17 mg/dL (ref 8–23)
Bicarbonate: 27 mmol/L (ref 22–29)
Bilirubin, Tot: 0.21 mg/dL (ref <1.2)
Calcium: 9.6 mg/dL (ref 8.5–10.6)
Chloride: 101 mmol/L (ref 98–107)
Creatinine: 0.49 mg/dL — ABNORMAL LOW (ref 0.51–0.95)
GFR: >60 mL/min
Glucose: 100 mg/dL — ABNORMAL HIGH (ref 70–99)
Potassium: 4.4 mmol/L (ref 3.5–5.1)
Sodium: 140 mmol/L (ref 136–145)
Total Protein: 6.7 g/dL (ref 6.0–8.0)
eGFR Based on CKD-EPI 2021 Equation: >60 mL/min

## 2020-10-06 LAB — AMIKACIN, PEAK: Amikacin, Peak: 11.1 ug/mL — ABNORMAL LOW (ref 20.0–25.0)

## 2020-10-06 NOTE — Telephone Encounter (Signed)
Rec call that the [facility] is having difficulty drawn labs from the PICC  LINE  Given some tips to succeed.   [facility] does not have periphera;l blood drawing supplies.   Pt was due to have Amikacin Peak drawn at 1:15-rec this call at 1:50pm.  The Amikacin peak will be inaccurate based on the time to be drawn.   Instructed to have the CBC, CMP drawn.     Will have Amikacin Peak drawn as ordered Monday 1 hour post infusion.         Tiffany Chen, [place] MR# [id number] IV Amikacin x 32months   Start date: 08/14/2020  End date: Nov    /DATE/ WBC HgB PLT Creatinine AST ALT Amikacin PEAK     08/16/20 7.4 11.2 273 0.55 18 11 24.9     08/18/20 8.2 12.3 249 0.58 18 13 20.4     08/21/20 10.1 12.6 280 0.55 19 14 Not run     08/25/2020 6.9 11.8 292 0.52 23 14 25.2     08/28/20 6.6 11.0 287 0.50 17 11 19.9     09/01/20 7.8 11.7 287 0.48 21 12 20.7     09/04/20 5.6 10.8 259 0.50 18 14 19.9     09/08/20 7.8 11.7 280 0.50 20 13 20.8     09/13/20 6.4 10.9 268 0.48 18 10 22.3     09/15/20 6.2 10.8 262 0.50 14 10 28.3     09/18/20 4.8 11.0 241 0.44 18 9 19.0     09/22/20 7.7 11.7 298 0.49 18 9 23.1     09/25/20 6.8 11.8 297 0.47 22 11 19.4     09/29/20 6.2 10.6 261 0.45 16 12 23.3     10/02/20 5.8 10.8 276 0.43 21 13 23.0

## 2020-10-06 NOTE — Telephone Encounter (Addendum)
Maud Deed, home health nurse,  is calling to state Amikacin was due at 1:15pm and they have been flushing the PICC line but cannot draw blood.     Since it was due at 1:15 she is asking for direction. Should they go to a lab or hospital.     Agent able to secure chat Royston Bake and transfer to Ext [phone]

## 2020-10-09 ENCOUNTER — Other Ambulatory Visit (HOSPITAL_BASED_OUTPATIENT_CLINIC_OR_DEPARTMENT_OTHER): Payer: Medicare Other

## 2020-10-09 ENCOUNTER — Other Ambulatory Visit
Admission: RE | Admit: 2020-10-09 | Discharge: 2020-10-09 | Disposition: A | Discharge status: Home / Self Care | Payer: Medicare Other | Attending: Critical Care Medicine | Admitting: Critical Care Medicine

## 2020-10-09 DIAGNOSIS — Z0189 Encounter for other specified special examinations: Secondary | ICD-10-CM | POA: Insufficient documentation

## 2020-10-09 LAB — CBC WITH DIFF, BLOOD
ANC-Automated: 3.2 10*3/uL (ref 1.6–7.0)
Abs Basophils: 0 10*3/uL (ref <0.2)
Abs Eosinophils: 0.3 10*3/uL (ref 0.0–0.5)
Abs Lymphs: 1.5 10*3/uL (ref 0.8–3.1)
Abs Monos: 0.6 10*3/uL (ref 0.2–0.8)
Basophils: 1 %
Eosinophils: 5 %
Hct: 34.2 % (ref 34.0–45.0)
Hgb: 10.8 gm/dL — ABNORMAL LOW (ref 11.2–15.7)
Lymphocytes: 26 %
MCH: 27.8 pg (ref 26.0–32.0)
MCHC: 31.6 g/dL — ABNORMAL LOW (ref 32.0–36.0)
MCV: 87.9 um3 (ref 79.0–95.0)
MPV: 10.4 fL (ref 9.4–12.4)
Monocytes: 11 %
Plt Count: 274 10*3/uL (ref 140–370)
RBC: 3.89 10*6/uL — ABNORMAL LOW (ref 3.90–5.20)
RDW: 13.8 % (ref 12.0–14.0)
Segs: 57 %
WBC: 5.6 10*3/uL (ref 4.0–10.0)

## 2020-10-09 LAB — COMPREHENSIVE METABOLIC PANEL, BLOOD
ALT (SGPT): 10 U/L (ref 0–33)
AST (SGOT): 19 U/L (ref 0–32)
Albumin: 4.5 g/dL (ref 3.5–5.2)
Alkaline Phos: 58 U/L (ref 40–130)
Anion Gap: 13 mmol/L (ref 7–15)
BUN: 15 mg/dL (ref 8–23)
Bicarbonate: 26 mmol/L (ref 22–29)
Bilirubin, Tot: 0.36 mg/dL (ref <1.2)
Calcium: 9.7 mg/dL (ref 8.5–10.6)
Chloride: 100 mmol/L (ref 98–107)
Creatinine: 0.49 mg/dL — ABNORMAL LOW (ref 0.51–0.95)
GFR: >60 mL/min
Glucose: 126 mg/dL — ABNORMAL HIGH (ref 70–99)
Potassium: 4.1 mmol/L (ref 3.5–5.1)
Sodium: 139 mmol/L (ref 136–145)
Total Protein: 7.2 g/dL (ref 6.0–8.0)
eGFR Based on CKD-EPI 2021 Equation: >60 mL/min

## 2020-10-09 LAB — AMIKACIN, PEAK: Amikacin, Peak: 19.3 ug/mL — ABNORMAL LOW (ref 20.0–25.0)

## 2020-10-10 ENCOUNTER — Telehealth (HOSPITAL_BASED_OUTPATIENT_CLINIC_OR_DEPARTMENT_OTHER): Payer: Self-pay

## 2020-10-10 DIAGNOSIS — A310 Pulmonary mycobacterial infection: Secondary | ICD-10-CM

## 2020-10-10 DIAGNOSIS — J984 Other disorders of lung: Secondary | ICD-10-CM

## 2020-10-10 LAB — AFB CULTURE W/STAIN: AFB Smear: POSITIVE — AB

## 2020-10-10 NOTE — Telephone Encounter (Signed)
MACRN:  Fu labs    Victorian, Gunn MR# [id number] IV Amikacin x 22months   Start date: 08/14/2020  End date: Nov    /DATE/ WBC HgB PLT Creatinine AST ALT Amikacin PEAK     08/16/20 7.4 11.2 273 0.55 18 11 24.9     08/18/20 8.2 12.3 249 0.58 18 13 20.4     08/21/20 10.1 12.6 280 0.55 19 14 Not run     08/25/2020 6.9 11.8 292 0.52 23 14 25.2     08/28/20 6.6 11.0 287 0.50 17 11 19.9     09/01/20 7.8 11.7 287 0.48 21 12 20.7     09/04/20 5.6 10.8 259 0.50 18 14 19.9     09/08/20 7.8 11.7 280 0.50 20 13 20.8     09/13/20 6.4 10.9 268 0.48 18 10 22.3     09/15/20 6.2 10.8 262 0.50 14 10 28.3     09/18/20 4.8 11.0 241 0.44 18 9 19.0     09/22/20 7.7 11.7 298 0.49 18 9 23.1     09/25/20 6.8 11.8 297 0.47 22 11 19.4     09/29/20 6.2 10.6 261 0.45 16 12 23.3     10/02/20 5.8 10.8 276 0.43 21 13 23.0     10/06/20 7.8 10.8 280 0.49 17 13 n/a drawn late     10/09/20 5.6 10.8 274 0.49 19 10 19.3

## 2020-10-12 ENCOUNTER — Ambulatory Visit (INDEPENDENT_AMBULATORY_CARE_PROVIDER_SITE_OTHER): Payer: Medicare Other | Admitting: Gastroenterology

## 2020-10-12 ENCOUNTER — Encounter (INDEPENDENT_AMBULATORY_CARE_PROVIDER_SITE_OTHER): Payer: Self-pay | Admitting: Gastroenterology

## 2020-10-12 VITALS — BP 127/67 | HR 87 | Temp 96.8°F | Ht 62.5 in | Wt 131.6 lb

## 2020-10-12 DIAGNOSIS — R490 Dysphonia: Secondary | ICD-10-CM | POA: Insufficient documentation

## 2020-10-12 DIAGNOSIS — K219 Gastro-esophageal reflux disease without esophagitis: Secondary | ICD-10-CM

## 2020-10-12 NOTE — Progress Notes (Signed)
GI Consultation:  Consulting Physician: Bonnetta Barry  Requesting Physician: Katheren Shams, MD  [address]  [place],  [place] [address]    Reason For Consultation: GERD   Chief Complaint: GERD    History of Present Illness:   Tiffany Chen is a 75 year old with a PMHx as below significant for pulmonary MAC, DMII, who now presents for GERD.    Briefly, Tiffany Chen reports chronic GERD symptoms since around 2018. She reports symptoms were worse with late day meals, laying flat to sleep. She has since lost weight and started eating dinner around 3-4pm. She reports with these changes symptoms have improved. She reports hoarse voice quality from time to time which she associates with GERD.    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    Social History:  Social History     Socioeconomic History    Marital status: Widowed   Tobacco Use    Smoking status: Never Smoker    Smokeless tobacco: Never Used        Family History:  No family history on file.    Allergy:  Allergies   Allergen Reactions    Psychologist, clinical Other       Medications:    Current Outpatient Medications:     albuterol 108 (90 Base) MCG/ACT inhaler, Inhale 2 puffs by mouth every 6 hours as needed for Wheezing., Disp: 3 each, Rfl: 3    amikacin (AMIKIN) 600 mg in dextrose 100 mL IVPB, Inject 600 mg into vein three times a week., Disp: , Rfl:     atorvastatin (LIPITOR) 10 MG tablet, Take 10 mg by mouth daily., Disp: , Rfl:     azithromycin  (ZITHROMAX) 250 MG tablet, Take 1 tablet (250 mg) by mouth daily., Disp: 30 tablet, Rfl: 11    ethambutol (MYAMBUTOL) 400 MG tablet, Take 2 tablets (800 mg) by mouth daily., Disp: 60 tablet, Rfl: 11    fluoxetine (PROZAC) 20 MG tablet, Take 20 mg by mouth daily., Disp: , Rfl:     FLUOXETINE HCL PO, 30 mg, Disp: , Rfl:     metFORMIN (GLUCOPHAGE) 1000 mg tablet, Take 1,000 mg by mouth 2 times daily (with meals)., Disp: , Rfl:     OMEPRAZOLE PO, , Disp: , Rfl:     Pediatric Multivitamins-Fl (MULTIVITAMINS/FL PO), , Disp: , Rfl:     ramipril (ALTACE) 2.5 MG capsule, Take 2.5 mg by mouth daily., Disp: , Rfl:     rifampin (RIFADIN) 300 MG capsule, Take 2 capsules (600 mg) by mouth daily., Disp: 60 capsule, Rfl: 11    sodium chloride 7 % NEBU, Inhale 4 mL by mouth every 12 hours., Disp: , Rfl:     sodium chloride 7 % NEBU, 4 mL by Nebulization route every 12 hours., Disp: 240 mL, Rfl: 11    Physician examination:   There were no vitals taken for this visit. There is no  height or weight on file to calculate BMI.  Wt Readings from Last 2 Encounters:   09/07/20 57.2 kg (126 lb)   08/29/20 58.5 kg (129 lb)      Blood Pressure   09/07/20 132/74   08/29/20 137/74           General: NAD, comfortable  Eyes: No icterus, EOMI    Labs: reviewed  CBC  Lab Results   Component Value Date    WBC 5.6 10/09/2020    HGB 10.8 (L) 10/09/2020    HCT 34.2 10/09/2020    MCV 87.9 10/09/2020    PLT 274 10/09/2020     Chemistry  Lab Results   Component Value Date    NA 139 10/09/2020    K 4.1 10/09/2020    CL 100 10/09/2020    BICARB 26 10/09/2020    BUN 15 10/09/2020    CREAT 0.49 (L) 10/09/2020    GLU 126 (H) 10/09/2020     Liver  Lab Results   Component Value Date    TBILI 0.36 10/09/2020    DBILI <0.2 07/17/2020    AST 19 10/09/2020    ALT 10 10/09/2020    ALK 58 10/09/2020    ALB 4.5 10/09/2020     CoagsNo results found for: INR, PTT    Assessment and Plan:  Tiffany Chen is a 75 year old with a PMHx as below significant for  pulmonary MAC, DMII, who now presents for GERD.    # GERD:  - EGD with pH BRavo  - Hold PPI for at least 7 days prior to procedure  - Lifestyle changes discussed    # Colon cancer screening: Normal in 2015  -  Repeat screening in 2027 if in good health    Electronically Signed by:  Bonnetta Barry, MD  Gastroenterology   I spent an aggregate of 45 minutes in preparation for visit (document/notes/labs review), patient face-to-face time, and post visit documentation and orders.

## 2020-10-12 NOTE — Telephone Encounter (Addendum)
MACRN:: [facility] called  Spoke to pharmacist: Dominque  AP-19.3 verified drawn 1 hr post infusion.  Goal-20-30    Per [facility]- pt had labs 1 hr post IV amikacin 10/03.Amikacin Peak level is     Recommendation: await    [facility] heal provides Medication  Phone#[facility] regarding labs 09/25/20 Amikacin Peak low   Ph.[phone]

## 2020-10-12 NOTE — Patient Instructions (Addendum)
-   Call [phone] to schedule your endoscopy with pH study    - To prevent heartburn you can; Elevate the head of the bed to reduce night time symptoms, refrain from assuming a supine (laying flat) position after meals and avoidance of meals three to four hours before bedtime. Try to avoid caffeine, alcohol, chocolate, spicy foods, food with high fat content, carbonated beverages, and peppermint

## 2020-10-13 ENCOUNTER — Other Ambulatory Visit
Admission: RE | Admit: 2020-10-13 | Discharge: 2020-10-13 | Disposition: A | Discharge status: Home / Self Care | Payer: Medicare Other | Attending: Critical Care Medicine | Admitting: Critical Care Medicine

## 2020-10-13 DIAGNOSIS — A319 Mycobacterial infection, unspecified: Secondary | ICD-10-CM | POA: Insufficient documentation

## 2020-10-13 LAB — CBC WITH DIFF, BLOOD
ANC-Automated: 4.2 10*3/uL (ref 1.6–7.0)
Abs Basophils: 0 10*3/uL (ref <0.2)
Abs Eosinophils: 0.3 10*3/uL (ref 0.0–0.5)
Abs Lymphs: 1.4 10*3/uL (ref 0.8–3.1)
Abs Monos: 0.4 10*3/uL (ref 0.2–0.8)
Basophils: 1 %
Eosinophils: 5 %
Hct: 35 % (ref 34.0–45.0)
Hgb: 10.9 gm/dL — ABNORMAL LOW (ref 11.2–15.7)
Lymphocytes: 22 %
MCH: 27.6 pg (ref 26.0–32.0)
MCHC: 31.1 g/dL — ABNORMAL LOW (ref 32.0–36.0)
MCV: 88.6 um3 (ref 79.0–95.0)
MPV: 10.2 fL (ref 9.4–12.4)
Monocytes: 6 %
Plt Count: 278 10*3/uL (ref 140–370)
RBC: 3.95 10*6/uL (ref 3.90–5.20)
RDW: 14 % (ref 12.0–14.0)
Segs: 66 %
WBC: 6.3 10*3/uL (ref 4.0–10.0)

## 2020-10-13 LAB — COMPREHENSIVE METABOLIC PANEL, BLOOD
ALT (SGPT): 13 U/L (ref 0–33)
AST (SGOT): 21 U/L (ref 0–32)
Albumin: 4.4 g/dL (ref 3.5–5.2)
Alkaline Phos: 61 U/L (ref 40–130)
Anion Gap: 12 mmol/L (ref 7–15)
BUN: 15 mg/dL (ref 8–23)
Bicarbonate: 25 mmol/L (ref 22–29)
Bilirubin, Tot: 0.39 mg/dL (ref <1.2)
Calcium: 9.9 mg/dL (ref 8.5–10.6)
Chloride: 100 mmol/L (ref 98–107)
Creatinine: 0.48 mg/dL — ABNORMAL LOW (ref 0.51–0.95)
GFR: >60 mL/min
Glucose: 125 mg/dL — ABNORMAL HIGH (ref 70–99)
Potassium: 4.4 mmol/L (ref 3.5–5.1)
Sodium: 137 mmol/L (ref 136–145)
Total Protein: 7.3 g/dL (ref 6.0–8.0)
eGFR Based on CKD-EPI 2021 Equation: >60 mL/min

## 2020-10-13 LAB — AMIKACIN, PEAK: Amikacin, Peak: 20.7 ug/mL (ref 20.0–25.0)

## 2020-10-13 NOTE — Telephone Encounter (Signed)
MACRN:   Spoke with [facility] Pharmacist Dominic-we are awaiting todays Amikacin Peak level    Will determine if change in dosing.

## 2020-10-16 ENCOUNTER — Telehealth (INDEPENDENT_AMBULATORY_CARE_PROVIDER_SITE_OTHER): Payer: Self-pay | Admitting: Critical Care Medicine

## 2020-10-16 ENCOUNTER — Other Ambulatory Visit (INDEPENDENT_AMBULATORY_CARE_PROVIDER_SITE_OTHER): Payer: Self-pay | Admitting: Gastroenterology

## 2020-10-16 ENCOUNTER — Other Ambulatory Visit
Admission: RE | Admit: 2020-10-16 | Discharge: 2020-10-16 | Disposition: A | Discharge status: Home / Self Care | Payer: Medicare Other | Attending: Critical Care Medicine | Admitting: Critical Care Medicine

## 2020-10-16 DIAGNOSIS — A319 Mycobacterial infection, unspecified: Secondary | ICD-10-CM | POA: Insufficient documentation

## 2020-10-16 DIAGNOSIS — K219 Gastro-esophageal reflux disease without esophagitis: Secondary | ICD-10-CM

## 2020-10-16 DIAGNOSIS — A310 Pulmonary mycobacterial infection: Secondary | ICD-10-CM | POA: Insufficient documentation

## 2020-10-16 LAB — CBC WITH DIFF, BLOOD
ANC-Automated: 4.3 10*3/uL (ref 1.6–7.0)
Abs Basophils: 0 10*3/uL (ref <0.2)
Abs Eosinophils: 0.2 10*3/uL (ref 0.0–0.5)
Abs Lymphs: 1.4 10*3/uL (ref 0.8–3.1)
Abs Monos: 0.4 10*3/uL (ref 0.2–0.8)
Basophils: 1 %
Eosinophils: 3 %
Hct: 36 % (ref 34.0–45.0)
Hgb: 11.3 gm/dL (ref 11.2–15.7)
Lymphocytes: 23 %
MCH: 27.8 pg (ref 26.0–32.0)
MCHC: 31.4 g/dL — ABNORMAL LOW (ref 32.0–36.0)
MCV: 88.5 um3 (ref 79.0–95.0)
MPV: 10.2 fL (ref 9.4–12.4)
Monocytes: 6 %
Plt Count: 272 10*3/uL (ref 140–370)
RBC: 4.07 10*6/uL (ref 3.90–5.20)
RDW: 14.4 % — ABNORMAL HIGH (ref 12.0–14.0)
Segs: 68 %
WBC: 6.4 10*3/uL (ref 4.0–10.0)

## 2020-10-16 LAB — COMPREHENSIVE METABOLIC PANEL, BLOOD
ALT (SGPT): 14 U/L (ref 0–33)
AST (SGOT): 24 U/L (ref 0–32)
Albumin: 4.4 g/dL (ref 3.5–5.2)
Alkaline Phos: 67 U/L (ref 40–130)
Anion Gap: 15 mmol/L (ref 7–15)
BUN: 13 mg/dL (ref 8–23)
Bicarbonate: 23 mmol/L (ref 22–29)
Bilirubin, Tot: 0.38 mg/dL (ref <1.2)
Calcium: 9.8 mg/dL (ref 8.5–10.6)
Chloride: 100 mmol/L (ref 98–107)
Creatinine: 0.5 mg/dL — ABNORMAL LOW (ref 0.51–0.95)
GFR: >60 mL/min
Glucose: 69 mg/dL — ABNORMAL LOW (ref 70–99)
Potassium: 4.5 mmol/L (ref 3.5–5.1)
Sodium: 138 mmol/L (ref 136–145)
Total Protein: 7.4 g/dL (ref 6.0–8.0)
eGFR Based on CKD-EPI 2021 Equation: >60 mL/min

## 2020-10-16 LAB — AMIKACIN, PEAK: Amikacin, Peak: 18.8 ug/mL — ABNORMAL LOW (ref 20.0–25.0)

## 2020-10-16 MED ORDER — DEXTROSE 5 % IV SOLN
650.0000 mg | INTRAVENOUS | Status: DC
Start: 2020-10-16 — End: 2020-12-07

## 2020-10-16 MED ORDER — OMEPRAZOLE 20 MG OR CPDR
20.0000 mg | DELAYED_RELEASE_CAPSULE | Freq: Every day | ORAL | 1 refills | Status: DC
Start: 2020-10-16 — End: 2021-03-28

## 2020-10-16 NOTE — Telephone Encounter (Addendum)
MACRN: follow up ;labs  Amikacin Peak: 20.7  Audiogram-next 10/18/2020    Tiffany Chen, Tiffany Chen MR# [id number] IV Amikacin x 62months   Start date: 08/14/2020  End date: Nov    /DATE/ WBC HgB PLT Creatinine AST ALT Amikacin PEAK     08/16/20 7.4 11.2 273 0.55 18 11 24.9     08/18/20 8.2 12.3 249 0.58 18 13 20.4     08/21/20 10.1 12.6 280 0.55 19 14 Not run     08/25/2020 6.9 11.8 292 0.52 23 14 25.2     08/28/20 6.6 11.0 287 0.50 17 11 19.9     09/01/20 7.8 11.7 287 0.48 21 12 20.7     09/04/20 5.6 10.8 259 0.50 18 14 19.9     09/08/20 7.8 11.7 280 0.50 20 13 20.8     09/13/20 6.4 10.9 268 0.48 18 10 22.3     09/15/20 6.2 10.8 262 0.50 14 10 28.3     09/18/20 4.8 11.0 241 0.44 18 9 19.0     09/22/20 7.7 11.7 298 0.49 18 9 23.1     09/25/20 6.8 11.8 297 0.47 22 11 19.4     09/29/20 6.2 10.6 261 0.45 16 12 23.3     10/02/20 5.8 10.8 276 0.43 21 13 23.0     10/06/20 7.8 10.8 280 0.49 17 13 n/a drawn late     10/09/20 5.6 10.8 274 0.49 19 10 19.3     10/13/20 6.3 10.9 278 0.48 21 13 20.7

## 2020-10-16 NOTE — Telephone Encounter (Signed)
Tiffany Chen has called in from [facility] to ask if Dr. Rinaldo Ratel put in orders for Home health and if he is also the assigned primary care provider. Per Tiffany Chen response is needed asap. Please leave a VM if does not answer of the name of the pt and DOB and the doctors name.      CB [phone]

## 2020-10-16 NOTE — Telephone Encounter (Signed)
Received a call from patient requesting refill for omeprazole 20 mg .   Patient last refill was in [place] with previous GI provider.  Order was pended and routed to provider for review

## 2020-10-16 NOTE — Telephone Encounter (Signed)
MACRN:   Return call to [facility]  Orders were signed and faxed back to [facility] agency

## 2020-10-16 NOTE — Telephone Encounter (Signed)
Recent amikacin peak levels between 19 and 21 mcg/ml,   '  I called and discussed with pharmcist at [place], Marton Redwood, [phone].     Plan:   - Increase dose of amiakcin to 650mg  IV three times weekly.   - goal amikacin peak 20-30  - still aim for end date 11/14/2020.   - Fax this note and the amikacin prescription (changed dose) to [facility], attn Dominic to both fax numbers:    [facility]: [phone]   Direct fax: [phone].

## 2020-10-16 NOTE — Telephone Encounter (Signed)
Please advise 

## 2020-10-17 NOTE — Telephone Encounter (Signed)
MCRN:   Noted change in dose amikacin.  Faxed order to [facility] pharmacy with faxed confirmation received.

## 2020-10-18 ENCOUNTER — Ambulatory Visit: Payer: Medicare Other | Attending: Critical Care Medicine | Admitting: Critical Care Medicine

## 2020-10-18 ENCOUNTER — Encounter (HOSPITAL_BASED_OUTPATIENT_CLINIC_OR_DEPARTMENT_OTHER): Payer: Self-pay | Admitting: Critical Care Medicine

## 2020-10-18 ENCOUNTER — Encounter (INDEPENDENT_AMBULATORY_CARE_PROVIDER_SITE_OTHER): Payer: Medicare Other | Admitting: Audiologist

## 2020-10-18 ENCOUNTER — Telehealth (HOSPITAL_BASED_OUTPATIENT_CLINIC_OR_DEPARTMENT_OTHER): Payer: Self-pay

## 2020-10-18 VITALS — BP 135/71 | HR 78 | Temp 98.4°F | Resp 18 | Ht 62.5 in | Wt 132.4 lb

## 2020-10-18 DIAGNOSIS — A310 Pulmonary mycobacterial infection: Secondary | ICD-10-CM

## 2020-10-18 DIAGNOSIS — A319 Mycobacterial infection, unspecified: Secondary | ICD-10-CM | POA: Insufficient documentation

## 2020-10-18 DIAGNOSIS — J984 Other disorders of lung: Secondary | ICD-10-CM | POA: Insufficient documentation

## 2020-10-18 MED ORDER — METFORMIN HCL 500 MG OR TABS
500.0000 mg | ORAL_TABLET | Freq: Two times a day (BID) | ORAL | Status: DC
Start: ? — End: 2022-03-11

## 2020-10-18 MED ORDER — ARIKAYCE 590 MG/8.4ML IN SUSP
590.0000 mg | Freq: Every day | RESPIRATORY_TRACT | 11 refills | Status: DC
Start: 2020-11-20 — End: 2021-10-02

## 2020-10-18 NOTE — Progress Notes (Signed)
Pulmonary Clinic Note    This patient was referred by Jonette Mate, MD  [address]  [place],  [place] [address]    Chief Complaint:  Tiffany Chen is a 75 year old female who is in the clinic today with a chief complaint of follow up.    History of Present Illness, Initial visit 07/2020:   Establishing care for cavitary MAC lung infection.     PMH of bronchiectasis, cavitary MAC lung infection, ruptured diverticulitis S/P colostomy in 2009, diabetes, ICH S/P embolization.     MAC history- treatment courses were all azithromycin 535m, Rifampin 6049m, ethambutol 160073mhree times weekly:   - 2018- 01/19/2018  - 05/25/2018- 10/22/2019  - 12/22/2019- 02/16/2020.     She was being treated by Dr. ChoCarola Frostr MAC lung infection. She reports that daily dosing of medication and adding a 4th antibiotic were not discussed before 2022, despite her disease being cavitary.     In late 2021, CT scan showed enlarging cavity. December 2021 a bronch was performed but no MAC growth. Her doctor restarted azithro/Rifampin/ethmabutol based on imaging.   Feb 2022 she saw [name] stopped the antibiotics based on the lack of microbiologic data. Performed repeat bronch 04/28/2020 which has smear AFB 3+ and culture MAC (drug susc pending)     Over the last several months her symptoms have consisted of significant daily coughing, causing night time awakening most nights, night sweats every night, and weight loss >30lbs. Also significant fatigue. Dr. AshCecile Hearingescribed nebulized saline but she did not start it.   She tried to walk several miles each day despite her fatigue.     Significant heartburn if she lays shortly after eating.     ROS as above otherwise negative.     Miscellaneous:   Never smoker.   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   Her husband died after contracting COVID in the hospital.   Moved [place] in [place] ago.   Her brother is Vegan.   No hot tubs, no  swimming pools.     This visit, 10/18/20:  Seen by GI 10/12/20 - planned for EGD with [facility]. Saw audiology 10/04/20: Comparison to previous audio done 09/19/20 showed stable threshold acuity for both ears.  Comparison to her first monitoring audio done 08/23/20 showed stable acuity as well. Seen in allergy clinic -  She had pneumonia vaccine levels that were not protective and re-vaccination was recommended followed by repeat blood testing 5 weeks afterwards. PFTs not yet done. Dose of amikacin was increased to 650 mg IV three times weekly on 10/16/20.     She feels that she is improving overall in terms of energy level. Night sweats has improved. Coughing has decreased (had a cough prior to starting the antibiotics but it actually improved prior to starting). She does feel short of breath with stairs etc. Appetite is better but not great. Weight is stable. PICC line is clotted and she had an issue with blood coming out of the PICC due to a cap being loose.     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  Allergies   Allergen Reactions    [facility] Other     Current Outpatient Medications on File Prior to Visit   Medication Sig Dispense Refill    albuterol 108 (90 Base) MCG/ACT inhaler Inhale 2 puffs by mouth every 6 hours as needed for Wheezing. 3 each 3    [DISCONTINUED] amikacin (AMIKIN) 600 mg in dextrose 100 mL IVPB Inject 600 mg into vein three times a week.      amikacin (AMIKIN) 650 mg in dextrose 100 mL IVPB Inject 650 mg into vein  three times a week.      atorvastatin (LIPITOR) 10 MG tablet Take 10 mg by mouth daily.      azithromycin (ZITHROMAX) 250 MG tablet Take 1 tablet (250 mg) by mouth daily. 30 tablet 11    ethambutol (MYAMBUTOL) 400 MG tablet Take 2 tablets (800 mg) by mouth daily. 60 tablet 11    fluoxetine (PROZAC) 20 MG tablet Take 20 mg by mouth daily.      FLUOXETINE HCL PO 30 mg      metFORMIN (GLUCOPHAGE) 1000 mg tablet Take 1,000 mg by mouth 2 times daily (with meals).      omeprazole (PRILOSEC) 20 MG capsule Take 1 capsule (20 mg) by mouth every morning (before breakfast). 90 capsule 1    [DISCONTINUED] OMEPRAZOLE PO       Pediatric Multivitamins-Fl (MULTIVITAMINS/FL PO)       ramipril (ALTACE) 2.5 MG capsule Take 2.5 mg by mouth daily.      rifampin (RIFADIN) 300 MG capsule Take 2 capsules (600 mg) by mouth daily. 60 capsule 11    sodium chloride 7 % NEBU Inhale 4 mL by mouth every 12 hours.      sodium chloride 7 % NEBU 4 mL by Nebulization route every 12 hours. 240 mL 11     No current facility-administered medications on file prior to visit.     No family history on file.  Social History     Socioeconomic History    Marital status: Widowed   Tobacco Use    Smoking status: Never    Smokeless tobacco: Never       Examination:  Vitals:    10/18/20 1036   BP: 135/71   BP Location: Right arm   BP Patient Position: Sitting   BP cuff size: Regular   Pulse: 78   Resp: 18   Temp: 98.4 F (36.9 C)   TempSrc: Temporal   SpO2: 96%   Weight: 60.1 kg (132 lb 6.4 oz)   Height: 5' 2.5" (1.588 m)     General: The patient is well appearing, sitting comfortably on the examination table with no evidence of respiratory distress.  Heart: regular rate and rhythm, no murmurs rubs or gallops, normally split S2, no accentuation of P2  Lungs: clear to ausculation and percussion, no wheezes, normal I:E ratio.   Extremities: no clubbing, cyanosis or edema. PICC line LUE no surrounding erythema/induration    Laboratory Review:  Lab  Results   Component Value Date    WBC 6.4 10/16/2020    HGB 11.3 10/16/2020    HCT 36.0 10/16/2020    PLT 272 10/16/2020     Lab Results   Component Value Date    NA 138 10/16/2020  K 4.5 10/16/2020    CL 100 10/16/2020    BICARB 23 10/16/2020    BUN 13 10/16/2020    CREAT 0.50 (L) 10/16/2020    GLU 69 (L) 10/16/2020    [place] 9.8 10/16/2020        Pulmonary Function Test Review:  04/28/2020 (care everywhere): noraml spito, normal volumes, mildly reduced DLCO    Radiology Review:  CT chest 04/13/2020:   CONCLUSION:   1. 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   2. Cavitary lesion in the periphery of the right lung has increased in size measuring 2.7 cm, although may have a slightly less thickened and nodular wall.   3. Diffuse bronchiectasis most pronounced in the right upper lobe.     Microbiology  BAL 04/28/2020 (from Dr. Cecile Hearing): AFB smear 3+, culture MAC (drug susceptibly testing sent to [facility]- pending)    Sputum - AFB  08/01/20: AFB smear negative, Cx MAC  08/03/20: AFB smear 1+, Cx MAC  08/07/20: AFB smear negative, Cx MAC    Sputum - Routine   08/01/20: NL Resp Flora   08/03/20: NL Resp Flora  08/07/20: NL Resp Flora    EKG   08/23/2020: sinus rhythm with PAC     Impression/Plan:    Problem List:   - cavitary MAC lung infection  - bronchiectasis  - IgG deficiency.     Discussion/plan (and see patient instructions):    - In light of the severe cavitary nature her MAC lung infection and the severity of her symptoms, strongly recommended daily azithro/rifampin/ethambutol PLUS Intravenous amikacin, which started 08/14/2020, with tentative plan to stop IV amikacin 11/14/20 and replace with arikayce   - would consider right upper lobectomy  - repeat CT chest 11/2020, approx 3 months into therapy  - repeat sputum x 3 (will all need to be induced)  - still pending PFT  - follow up with GI re GERD -- pending EGD with [facility]  - follow  up with Allergy/immunology re hypogammaglobulinemia - pending repeat titers after pneumonia vaccine  - continue airway clearance  - PICC line issue -- will discuss alteplase or other options with home health    Patient Instructions   -keep having the 2 week audiograms  -submit sputums monthly-if unable can call to schedule induced sputums-[phone]  -have a chest CT in first week in November-call [phone] to schedule  -continue Nebulized saline twice a day  -follow up with Ledon Np in 11/08 at 8:00 am  and with me 01/17/21 8:00am    Orders:  No orders of the defined types were placed in this encounter.    Orders Placed This Encounter   Procedures    CT Chest W/O Contrast     No orders of the defined types were placed in this encounter.    Patient seen and discussed with attending, [name].    Sabino Dick, MD  Fellow, Pulmonary and Critical Care Medicine

## 2020-10-18 NOTE — Telephone Encounter (Signed)
Julian Hy, Nurse with [facility], is returning a call back from [place]. She is requesting a call back to further discuss patient's PICC line, and can be reached at Cell#[phone]. Per Julian Hy, it is OK to leave a detailed message. Please advise.

## 2020-10-18 NOTE — Patient Instructions (Addendum)
-  keep having the 2 week audiograms  -submit sputums monthly-if unable can call to schedule induced sputums-[phone]  -have a chest CT in first week in November-call [phone] to schedule  -continue Nebulized saline twice a day  -follow up with Ledon Np in 11/08 at 8:00 am  and with me 01/17/21 8:00am

## 2020-10-18 NOTE — Telephone Encounter (Signed)
MACRN:   Follow up call to [facility] after pt appointment. Picc Line issue- unable to get blood draw. Infusions are going w/o difficuty  Perry Mount Programmer, applications

## 2020-10-18 NOTE — Progress Notes (Signed)
PCCM Attending: patient seen and examined. Labs and imaging reviewed. Agree with the assessment and plan as outlined in the trainee's note. All the decision making and plan outlined in the trainee's note was discussed extensively and approved with me.     Total time spent :   - with patient, examining, history taking, planning management, counseling  - speaking to pharmacist, [facility].   - reviewing images  - records review  - interpretation of results  - documentation    [name], MD

## 2020-10-19 NOTE — Telephone Encounter (Signed)
MACRN: call back to Chyrel Masson, RN [employer] with pt regarding PICC LINE issues  -RN noted that the PIcc line is 4cm out from start-is still having good infusion. Still unable to draw blood. Have been doing peripheral sticks.   Pt in clinic yesterday discussed-she is ok if continued Peripheral labs drawn.    Orders in place:  -standard order-for heparin after every infusion.  -heparin after every blood draw  -heparinize once a day  -[facility]-rx -will have RN flush with tomorrow.  -HHA RN will call tomorrow if successful with picc blood draw.

## 2020-10-20 ENCOUNTER — Telehealth (HOSPITAL_BASED_OUTPATIENT_CLINIC_OR_DEPARTMENT_OTHER): Payer: Self-pay | Admitting: Critical Care Medicine

## 2020-10-20 NOTE — Telephone Encounter (Signed)
Tesa is calling in regards to encounter below. PICC line is not providing a blood. Would like to be advise if pt needs to go to the lab to get blood drawn. Please assist. Thank you       Ph# [phone]

## 2020-10-20 NOTE — Telephone Encounter (Signed)
Tessa calling from [facility] asking for a call back to discuss patients PICC line and issue with lab draw. Please assist.       Ph# [phone]

## 2020-10-20 NOTE — Telephone Encounter (Signed)
[facility] RN Triage Note:     RN notified Tesa from [facility] to draw the CBC and CMP on Monday instead together with the Amikacin level per MD's recommendation.     Lucio Edward MSN, RN  [facility]  P: [phone]

## 2020-10-23 ENCOUNTER — Encounter (INDEPENDENT_AMBULATORY_CARE_PROVIDER_SITE_OTHER): Payer: Self-pay

## 2020-10-23 ENCOUNTER — Telehealth (HOSPITAL_BASED_OUTPATIENT_CLINIC_OR_DEPARTMENT_OTHER): Payer: Self-pay

## 2020-10-23 ENCOUNTER — Other Ambulatory Visit
Admission: RE | Admit: 2020-10-23 | Discharge: 2020-10-23 | Disposition: A | Discharge status: Home / Self Care | Payer: Medicare Other | Attending: Critical Care Medicine | Admitting: Critical Care Medicine

## 2020-10-23 DIAGNOSIS — A310 Pulmonary mycobacterial infection: Secondary | ICD-10-CM | POA: Insufficient documentation

## 2020-10-23 DIAGNOSIS — J479 Bronchiectasis, uncomplicated: Secondary | ICD-10-CM | POA: Insufficient documentation

## 2020-10-23 DIAGNOSIS — J984 Other disorders of lung: Secondary | ICD-10-CM | POA: Insufficient documentation

## 2020-10-23 LAB — CBC WITH DIFF, BLOOD
ANC-Automated: 4 10*3/uL (ref 1.6–7.0)
Abs Basophils: 0.1 10*3/uL (ref <0.2)
Abs Eosinophils: 0.2 10*3/uL (ref 0.0–0.5)
Abs Lymphs: 1.2 10*3/uL (ref 0.8–3.1)
Abs Monos: 0.4 10*3/uL (ref 0.2–0.8)
Basophils: 1 %
Eosinophils: 3 %
Hct: 32.6 % — ABNORMAL LOW (ref 34.0–45.0)
Hgb: 10.1 gm/dL — ABNORMAL LOW (ref 11.2–15.7)
Lymphocytes: 21 %
MCH: 27.7 pg (ref 26.0–32.0)
MCHC: 31 g/dL — ABNORMAL LOW (ref 32.0–36.0)
MCV: 89.3 um3 (ref 79.0–95.0)
MPV: 10.2 fL (ref 9.4–12.4)
Monocytes: 7 %
Plt Count: 282 10*3/uL (ref 140–370)
RBC: 3.65 10*6/uL — ABNORMAL LOW (ref 3.90–5.20)
RDW: 14 % (ref 12.0–14.0)
Segs: 68 %
WBC: 5.9 10*3/uL (ref 4.0–10.0)

## 2020-10-23 LAB — COMPREHENSIVE METABOLIC PANEL, BLOOD
ALT (SGPT): 10 U/L (ref 0–33)
AST (SGOT): 19 U/L (ref 0–32)
Albumin: 4.3 g/dL (ref 3.5–5.2)
Alkaline Phos: 59 U/L (ref 40–130)
Anion Gap: 14 mmol/L (ref 7–15)
BUN: 12 mg/dL (ref 8–23)
Bicarbonate: 23 mmol/L (ref 22–29)
Bilirubin, Tot: 0.4 mg/dL (ref <1.2)
Calcium: 9.7 mg/dL (ref 8.5–10.6)
Chloride: 100 mmol/L (ref 98–107)
Creatinine: 0.46 mg/dL — ABNORMAL LOW (ref 0.51–0.95)
GFR: >60 mL/min
Glucose: 82 mg/dL (ref 70–99)
Potassium: 4.2 mmol/L (ref 3.5–5.1)
Sodium: 137 mmol/L (ref 136–145)
Total Protein: 6.9 g/dL (ref 6.0–8.0)
eGFR Based on CKD-EPI 2021 Equation: >60 mL/min

## 2020-10-23 LAB — AMIKACIN, PEAK: Amikacin, Peak: 25.1 ug/mL — ABNORMAL HIGH (ref 20.0–25.0)

## 2020-10-23 NOTE — Telephone Encounter (Signed)
MACRN:   Follow up IV Home infusion Amikacin    [facility] [phone] -RN    -were unable to drawn labs on Friday 1014  Lab draw today.

## 2020-10-24 ENCOUNTER — Telehealth (HOSPITAL_BASED_OUTPATIENT_CLINIC_OR_DEPARTMENT_OTHER): Payer: Self-pay | Admitting: Critical Care Medicine

## 2020-10-24 NOTE — Telephone Encounter (Signed)
Noted labs IV Infusion Amikacin      Tiffany Chen, Tiffany Chen MR# [id number] IV Amikacin x 49months   Start date: 08/14/2020  End date: Nov    /DATE/ WBC HgB PLT Creatinine AST ALT Amikacin PEAK     10/13/20 6.3 10.9 278 0.48 21 13 20.7     10/16/20 6.4 11.3 272 0.50 24 14 18.8     10/23/20 5.9 10.1 282 0.46 19 10 25.1

## 2020-10-24 NOTE — Telephone Encounter (Signed)
Deliah called in from [facility] to advise Amikacin Sulfate Liposome (ARIKAYCE) 590 MG/8.4ML SUSP requires a PA and she will be sending a fax request . Agent provider fax# [phone] Thank you please assist.         Ph# [phone]  Fax# [phone]

## 2020-10-27 ENCOUNTER — Other Ambulatory Visit
Admission: RE | Admit: 2020-10-27 | Discharge: 2020-10-27 | Disposition: A | Discharge status: Home Health | Payer: Medicare Other

## 2020-10-27 ENCOUNTER — Telehealth (HOSPITAL_BASED_OUTPATIENT_CLINIC_OR_DEPARTMENT_OTHER): Payer: Self-pay | Admitting: Critical Care Medicine

## 2020-10-27 ENCOUNTER — Other Ambulatory Visit: Payer: Medicare Other | Attending: Allergy

## 2020-10-27 DIAGNOSIS — A310 Pulmonary mycobacterial infection: Secondary | ICD-10-CM

## 2020-10-27 DIAGNOSIS — J479 Bronchiectasis, uncomplicated: Secondary | ICD-10-CM

## 2020-10-27 DIAGNOSIS — A319 Mycobacterial infection, unspecified: Secondary | ICD-10-CM

## 2020-10-27 DIAGNOSIS — J984 Other disorders of lung: Secondary | ICD-10-CM

## 2020-10-27 LAB — CBC WITH DIFF, BLOOD
ANC-Automated: 3.5 10*3/uL (ref 1.6–7.0)
Abs Basophils: 0 10*3/uL (ref <0.2)
Abs Eosinophils: 0.2 10*3/uL (ref 0.0–0.5)
Abs Lymphs: 1.3 10*3/uL (ref 0.8–3.1)
Abs Monos: 0.5 10*3/uL (ref 0.2–0.8)
Basophils: 1 %
Eosinophils: 4 %
Hct: 31.9 % — ABNORMAL LOW (ref 34.0–45.0)
Hgb: 10.2 gm/dL — ABNORMAL LOW (ref 11.2–15.7)
Lymphocytes: 23 %
MCH: 27.9 pg (ref 26.0–32.0)
MCHC: 32 g/dL (ref 32.0–36.0)
MCV: 87.2 um3 (ref 79.0–95.0)
MPV: 10.3 fL (ref 9.4–12.4)
Monocytes: 9 %
Plt Count: 274 10*3/uL (ref 140–370)
RBC: 3.66 10*6/uL — ABNORMAL LOW (ref 3.90–5.20)
RDW: 13.5 % (ref 12.0–14.0)
Segs: 63 %
WBC: 5.5 10*3/uL (ref 4.0–10.0)

## 2020-10-27 LAB — COMPREHENSIVE METABOLIC PANEL, BLOOD
ALT (SGPT): 15 U/L (ref 0–33)
AST (SGOT): 19 U/L (ref 0–32)
Albumin: 4.2 g/dL (ref 3.5–5.2)
Alkaline Phos: 60 U/L (ref 40–130)
Anion Gap: 8 mmol/L (ref 7–15)
BUN: 14 mg/dL (ref 8–23)
Bicarbonate: 28 mmol/L (ref 22–29)
Bilirubin, Tot: 0.21 mg/dL (ref <1.2)
Calcium: 9.4 mg/dL (ref 8.5–10.6)
Chloride: 98 mmol/L (ref 98–107)
Creatinine: 0.48 mg/dL — ABNORMAL LOW (ref 0.51–0.95)
GFR: >60 mL/min
Glucose: 114 mg/dL — ABNORMAL HIGH (ref 70–99)
Potassium: 3.9 mmol/L (ref 3.5–5.1)
Sodium: 134 mmol/L — ABNORMAL LOW (ref 136–145)
Total Protein: 6.9 g/dL (ref 6.0–8.0)
eGFR Based on CKD-EPI 2021 Equation: >60 mL/min

## 2020-10-27 LAB — AMIKACIN, PEAK: Amikacin, Peak: 34.8 ug/mL — ABNORMAL HIGH (ref 20.0–25.0)

## 2020-10-27 NOTE — Telephone Encounter (Signed)
Toniann Fail from  [facility] called to verify if   MD would like to make any adjustments to current dose of medication Amikacin  650 amikacin 3x week based on lab results.  Please assist. Thank you.     Ph. [phone]  Please ask for on call pharmacist.

## 2020-10-30 ENCOUNTER — Other Ambulatory Visit: Payer: Self-pay

## 2020-10-30 ENCOUNTER — Other Ambulatory Visit: Admit: 2020-10-30 | Payer: Medicare Other

## 2020-10-30 LAB — CBC WITH DIFF, BLOOD
ANC-Automated: 4 10*3/uL (ref 1.6–7.0)
Abs Basophils: 0 10*3/uL (ref <0.2)
Abs Eosinophils: 0.2 10*3/uL (ref 0.0–0.5)
Abs Lymphs: 1.4 10*3/uL (ref 0.8–3.1)
Abs Monos: 0.5 10*3/uL (ref 0.2–0.8)
Basophils: 1 %
Eosinophils: 3 %
Hct: 35.6 % (ref 34.0–45.0)
Hgb: 11 gm/dL — ABNORMAL LOW (ref 11.2–15.7)
Lymphocytes: 23 %
MCH: 27.4 pg (ref 26.0–32.0)
MCHC: 30.9 g/dL — ABNORMAL LOW (ref 32.0–36.0)
MCV: 88.8 um3 (ref 79.0–95.0)
MPV: 10.4 fL (ref 9.4–12.4)
Monocytes: 8 %
Plt Count: 278 10*3/uL (ref 140–370)
RBC: 4.01 10*6/uL (ref 3.90–5.20)
RDW: 13.6 % (ref 12.0–14.0)
Segs: 66 %
WBC: 6.1 10*3/uL (ref 4.0–10.0)

## 2020-10-30 LAB — COMPREHENSIVE METABOLIC PANEL, BLOOD
ALT (SGPT): 13 U/L (ref 0–33)
AST (SGOT): 18 U/L (ref 0–32)
Albumin: 4.6 g/dL (ref 3.5–5.2)
Alkaline Phos: 64 U/L (ref 40–130)
Anion Gap: 10 mmol/L (ref 7–15)
BUN: 16 mg/dL (ref 8–23)
Bicarbonate: 28 mmol/L (ref 22–29)
Bilirubin, Tot: 0.34 mg/dL (ref <1.2)
Calcium: 9.6 mg/dL (ref 8.5–10.6)
Chloride: 99 mmol/L (ref 98–107)
Creatinine: 0.48 mg/dL — ABNORMAL LOW (ref 0.51–0.95)
GFR: >60 mL/min
Glucose: 102 mg/dL — ABNORMAL HIGH (ref 70–99)
Potassium: 4 mmol/L (ref 3.5–5.1)
Sodium: 137 mmol/L (ref 136–145)
Total Protein: 7.5 g/dL (ref 6.0–8.0)
eGFR Based on CKD-EPI 2021 Equation: >60 mL/min

## 2020-10-30 LAB — AMIKACIN, PEAK: Amikacin, Peak: 23.2 ug/mL (ref 20.0–25.0)

## 2020-10-30 NOTE — Telephone Encounter (Signed)
From RN note:   Noted   10/27/20 Amikacin peak-34.8  10/23/20 Amikacin peak-25.1    Pt is due for lab draw today 10/30/2020  [facility] [phone]  Dominic pharmacist  Goal-20-30    Plan:   - amikacin peak slightly higher than goal.   - continue same dose and check amikacin peak level today.

## 2020-10-30 NOTE — Telephone Encounter (Signed)
MACRN: notified [facility]-Dominic aware  No change in Amikacin dose  Check Amikacin peak today and review

## 2020-10-30 NOTE — Telephone Encounter (Signed)
MACRN:    Noted   10/27/20 Amikacin peak-34.8  10/23/20 Amikacin peak-25.1    Pt is due for lab draw today 10/30/2020  [facility] [phone]  Dominic pharmacist  Goal-20-30

## 2020-10-31 ENCOUNTER — Ambulatory Visit: Admit: 2020-10-31 | Discharge: 2020-10-31 | Disposition: A | Discharge status: Home / Self Care | Payer: Medicare Other

## 2020-10-31 ENCOUNTER — Other Ambulatory Visit
Admission: RE | Admit: 2020-10-31 | Discharge: 2020-10-31 | Disposition: A | Discharge status: Home / Self Care | Payer: Medicare Other | Attending: Critical Care Medicine | Admitting: Critical Care Medicine

## 2020-10-31 ENCOUNTER — Telehealth (HOSPITAL_BASED_OUTPATIENT_CLINIC_OR_DEPARTMENT_OTHER): Payer: Self-pay | Admitting: Critical Care Medicine

## 2020-10-31 DIAGNOSIS — A319 Mycobacterial infection, unspecified: Secondary | ICD-10-CM | POA: Insufficient documentation

## 2020-10-31 DIAGNOSIS — J984 Other disorders of lung: Secondary | ICD-10-CM

## 2020-10-31 DIAGNOSIS — A310 Pulmonary mycobacterial infection: Secondary | ICD-10-CM

## 2020-10-31 LAB — PNEUMOCOCCAL ABS, IGG 23
Pneumococcal Serotype 10A IgG: 0.83 ug/mL
Pneumococcal Serotype 11A IgG: 2.87 ug/mL
Pneumococcal Serotype 15B IgG: 5.01 ug/mL
Pneumococcal Serotype 17F IgG: 1.35 ug/mL
Pneumococcal Serotype 19A IgG: 2.79 ug/mL
Pneumococcal Serotype 2 IgG: 6.09 ug/mL
Pneumococcal Serotype 20 IgG: 1.88 ug/mL
Pneumococcal Serotype 22F IgG: >29.84 ug/mL
Pneumococcal Serotype 33F IgG: 6.03 ug/mL
Serotyp 18C* IgG: 1.49 ug/mL
Serotyp 19F* IgG: 1.23 ug/mL
Serotyp 23F* IgG: 0.13 ug/mL
Serotype 1 IgG: 0.66 ug/mL
Serotype 12F IgG: 0.15 ug/mL
Serotype 14* IgG: 6.66 ug/mL
Serotype 3 IgG: 5.73 ug/mL
Serotype 4* IgG: 0.46 ug/mL
Serotype 5 IgG: 1.19 ug/mL
Serotype 6B* IgG: 0.3 ug/mL
Serotype 7F IgG: 1.5 ug/mL
Serotype 8 IgG: 1.9 ug/mL
Serotype 9N IgG: 0.37 ug/mL
Serotype 9V* IgG: 4.64 ug/mL

## 2020-10-31 NOTE — Telephone Encounter (Signed)
Belen from [facility] called requesting recent lab results    Please send to [facility]  Fax [phone] and [facility] fax. [phone]. [phone]     Thank you

## 2020-10-31 NOTE — Telephone Encounter (Signed)
Belen from [facility] called requesting recent lab results. Per previous TE the labs were sent to [facility] but [facility] is also requesting the lab results.       Please assist. Thank you.     Fax# [phone]

## 2020-10-31 NOTE — Telephone Encounter (Signed)
Amikacin peak level within goal 20-30

## 2020-10-31 NOTE — Telephone Encounter (Signed)
Dominick from [facility] called requesting call back to check on Amikacin peak and to confirm dose. Please assist. Thank you    [phone]

## 2020-10-31 NOTE — Telephone Encounter (Signed)
MACRN:  Noted labs   0 Result Notes  Component Ref Range & Units 1 d ago   (10/30/20) 4 d ago   (10/27/20) 8 d ago   (10/23/20) 2 wk ago   (10/16/20) 2 wk ago   (10/13/20) 3 wk ago   (10/09/20) 3 wk ago   (10/06/20)   Amikacin, Peak 20.0 - 25.0 mcg/mL 23.2  34.8High  25.1High  18.8Low  20.7  19.3Low  11.1Low    Resulting Agency  CALM CALM CALM CALM           Dominic [facility] called. Keep IV Amikacin dosing same. Pharmacist agreeable to this plan

## 2020-10-31 NOTE — Telephone Encounter (Signed)
Faxed labs to [facility]. With faxed confirmation.    Noted no change in dosing of IV Amikacin.

## 2020-10-31 NOTE — Telephone Encounter (Signed)
Faxed to  [phone]

## 2020-11-01 ENCOUNTER — Ambulatory Visit (INDEPENDENT_AMBULATORY_CARE_PROVIDER_SITE_OTHER): Payer: Medicare Other | Admitting: Audiology

## 2020-11-01 DIAGNOSIS — H903 Sensorineural hearing loss, bilateral: Secondary | ICD-10-CM

## 2020-11-01 DIAGNOSIS — H9313 Tinnitus, bilateral: Secondary | ICD-10-CM

## 2020-11-01 NOTE — Progress Notes (Signed)
History  Patient here for monitoring audiogram due to use of IV amikacin, azithromycin, Ethambutol, and Rifampin for MAC lung disease.  She reported that the IV amikacin will be discontinued 11/14/20, but she will be switching to inhaled amikacin.  Patient reported no change in her hearing or any change in her fluctuating, bilateral tinnitus since her last test 1 mo ago.  She reported dizziness only if she gets up too fast.  Denied aural pain/pressure/drainage.    Otoscopy  Unremarkable for either ear.    Results  Testing for both ears indicated normal hearing [phone] Hz, sloping from very mild to moderate sensorineural hearing loss 02-7998 Hz.  Speech discrimination was excellent (96%) in the left ear at a normal presentation level and very good (92%) in the right ear at a slightly elevated presentation level.    Ultra high frequency testing showed moderate to severe hearing loss 9-16,000 Hz bilaterally.    DNT immittance as patient was non-symptomatic for middle ear pathology.    Comparison to previous audio done 10/04/20 showed stable acuity for both ears.    Recommendations  Patient is scheduled to follow-up with [name] next month.  Scheduled another audiogram in about 12 days for monitoring of the final round of IV amikacin.  She will check with [name] to see if this is needed.      Audiogram graph is available for view under media.    Orrin Brigham, Cletis Athens., CCC/A  Sr. Audiologist  Licensed Dispensing Audiologist  [facility]

## 2020-11-02 ENCOUNTER — Ambulatory Visit (HOSPITAL_COMMUNITY): Payer: Medicare Other

## 2020-11-03 ENCOUNTER — Other Ambulatory Visit
Admission: RE | Admit: 2020-11-03 | Discharge: 2020-11-03 | Disposition: A | Discharge status: Home / Self Care | Payer: Medicare Other | Attending: Critical Care Medicine | Admitting: Critical Care Medicine

## 2020-11-03 DIAGNOSIS — A310 Pulmonary mycobacterial infection: Secondary | ICD-10-CM | POA: Insufficient documentation

## 2020-11-03 DIAGNOSIS — J984 Other disorders of lung: Secondary | ICD-10-CM | POA: Insufficient documentation

## 2020-11-03 DIAGNOSIS — J479 Bronchiectasis, uncomplicated: Secondary | ICD-10-CM | POA: Insufficient documentation

## 2020-11-03 LAB — COMPREHENSIVE METABOLIC PANEL, BLOOD
ALT (SGPT): 10 U/L (ref 0–33)
AST (SGOT): 20 U/L (ref 0–32)
Albumin: 4.3 g/dL (ref 3.5–5.2)
Alkaline Phos: 58 U/L (ref 40–130)
Anion Gap: 10 mmol/L (ref 7–15)
BUN: 15 mg/dL (ref 8–23)
Bicarbonate: 27 mmol/L (ref 22–29)
Bilirubin, Tot: 0.34 mg/dL (ref <1.2)
Calcium: 9.8 mg/dL (ref 8.5–10.6)
Chloride: 102 mmol/L (ref 98–107)
Creatinine: 0.52 mg/dL (ref 0.51–0.95)
GFR: >60 mL/min
Glucose: 102 mg/dL — ABNORMAL HIGH (ref 70–99)
Potassium: 4.4 mmol/L (ref 3.5–5.1)
Sodium: 139 mmol/L (ref 136–145)
Total Protein: 7.2 g/dL (ref 6.0–8.0)
eGFR Based on CKD-EPI 2021 Equation: >60 mL/min

## 2020-11-03 LAB — CBC WITH DIFF, BLOOD
ANC-Automated: 3.4 10*3/uL (ref 1.6–7.0)
Abs Basophils: 0 10*3/uL (ref <0.2)
Abs Eosinophils: 0.3 10*3/uL (ref 0.0–0.5)
Abs Lymphs: 1.2 10*3/uL (ref 0.8–3.1)
Abs Monos: 0.4 10*3/uL (ref 0.2–0.8)
Basophils: 1 %
Eosinophils: 5 %
Hct: 34.6 % (ref 34.0–45.0)
Hgb: 10.4 gm/dL — ABNORMAL LOW (ref 11.2–15.7)
Lymphocytes: 23 %
MCH: 26.7 pg (ref 26.0–32.0)
MCHC: 30.1 g/dL — ABNORMAL LOW (ref 32.0–36.0)
MCV: 88.9 um3 (ref 79.0–95.0)
MPV: 10.4 fL (ref 9.4–12.4)
Monocytes: 8 %
Plt Count: 269 10*3/uL (ref 140–370)
RBC: 3.89 10*6/uL — ABNORMAL LOW (ref 3.90–5.20)
RDW: 13.6 % (ref 12.0–14.0)
Segs: 63 %
WBC: 5.4 10*3/uL (ref 4.0–10.0)

## 2020-11-03 LAB — RESPIRATORY CULTURE W/GRAM STAIN: Respiratory Culture Result: NORMAL

## 2020-11-03 LAB — AMIKACIN, PEAK: Amikacin, Peak: 20.5 ug/mL (ref 20.0–25.0)

## 2020-11-06 ENCOUNTER — Other Ambulatory Visit
Admission: RE | Admit: 2020-11-06 | Discharge: 2020-11-06 | Disposition: A | Discharge status: Home / Self Care | Payer: Medicare Other | Attending: Critical Care Medicine | Admitting: Critical Care Medicine

## 2020-11-06 ENCOUNTER — Encounter (INDEPENDENT_AMBULATORY_CARE_PROVIDER_SITE_OTHER): Payer: Self-pay | Admitting: Allergy

## 2020-11-06 DIAGNOSIS — A319 Mycobacterial infection, unspecified: Secondary | ICD-10-CM | POA: Insufficient documentation

## 2020-11-06 LAB — CBC WITH DIFF, BLOOD
ANC-Automated: 4.2 10*3/uL (ref 1.6–7.0)
Abs Basophils: 0.1 10*3/uL (ref <0.2)
Abs Eosinophils: 0.2 10*3/uL (ref 0.0–0.5)
Abs Lymphs: 1.5 10*3/uL (ref 0.8–3.1)
Abs Monos: 0.4 10*3/uL (ref 0.2–0.8)
Basophils: 1 %
Eosinophils: 3 %
Hct: 33.3 % — ABNORMAL LOW (ref 34.0–45.0)
Hgb: 10.4 gm/dL — ABNORMAL LOW (ref 11.2–15.7)
Lymphocytes: 24 %
MCH: 28 pg (ref 26.0–32.0)
MCHC: 31.2 g/dL — ABNORMAL LOW (ref 32.0–36.0)
MCV: 89.5 um3 (ref 79.0–95.0)
MPV: 10.2 fL (ref 9.4–12.4)
Monocytes: 7 %
Plt Count: 302 10*3/uL (ref 140–370)
RBC: 3.72 10*6/uL — ABNORMAL LOW (ref 3.90–5.20)
RDW: 13.5 % (ref 12.0–14.0)
Segs: 66 %
WBC: 6.4 10*3/uL (ref 4.0–10.0)

## 2020-11-06 LAB — COMPREHENSIVE METABOLIC PANEL, BLOOD
ALT (SGPT): 16 U/L (ref 0–33)
AST (SGOT): 36 U/L — ABNORMAL HIGH (ref 0–32)
Albumin: 4.4 g/dL (ref 3.5–5.2)
Alkaline Phos: 61 U/L (ref 40–130)
Anion Gap: 12 mmol/L (ref 7–15)
BUN: 13 mg/dL (ref 8–23)
Bicarbonate: 26 mmol/L (ref 22–29)
Bilirubin, Tot: 0.3 mg/dL (ref <1.2)
Calcium: 9.6 mg/dL (ref 8.5–10.6)
Chloride: 98 mmol/L (ref 98–107)
Creatinine: 0.49 mg/dL — ABNORMAL LOW (ref 0.51–0.95)
GFR: >60 mL/min
Glucose: 99 mg/dL (ref 70–99)
Potassium: 4.9 mmol/L (ref 3.5–5.1)
Sodium: 136 mmol/L (ref 136–145)
Total Protein: 7.3 g/dL (ref 6.0–8.0)
eGFR Based on CKD-EPI 2021 Equation: >60 mL/min

## 2020-11-06 LAB — AMIKACIN, PEAK: Amikacin, Peak: 23.2 ug/mL (ref 20.0–25.0)

## 2020-11-06 NOTE — Telephone Encounter (Signed)
Called patient, LVM to call office to schedule follow up to discuss lab results

## 2020-11-07 ENCOUNTER — Encounter (INDEPENDENT_AMBULATORY_CARE_PROVIDER_SITE_OTHER): Payer: Self-pay | Admitting: Allergy

## 2020-11-07 ENCOUNTER — Telehealth (HOSPITAL_BASED_OUTPATIENT_CLINIC_OR_DEPARTMENT_OTHER): Payer: Self-pay | Admitting: Critical Care Medicine

## 2020-11-07 ENCOUNTER — Ambulatory Visit
Admission: RE | Admit: 2020-11-07 | Discharge: 2020-11-07 | Disposition: A | Discharge status: Home / Self Care | Payer: Medicare Other | Attending: Critical Care Medicine | Admitting: Critical Care Medicine

## 2020-11-07 DIAGNOSIS — A319 Mycobacterial infection, unspecified: Secondary | ICD-10-CM | POA: Insufficient documentation

## 2020-11-07 DIAGNOSIS — J479 Bronchiectasis, uncomplicated: Secondary | ICD-10-CM

## 2020-11-07 DIAGNOSIS — A310 Pulmonary mycobacterial infection: Secondary | ICD-10-CM | POA: Insufficient documentation

## 2020-11-07 DIAGNOSIS — J984 Other disorders of lung: Secondary | ICD-10-CM | POA: Insufficient documentation

## 2020-11-07 NOTE — Telephone Encounter (Signed)
FAXED AS REQUESTED

## 2020-11-07 NOTE — Telephone Encounter (Signed)
Patient called, patient declined MCVV on 11/21/20. Patient requested F2F. Scheduled next available F2F appt as requested for 12/18/20

## 2020-11-07 NOTE — Telephone Encounter (Signed)
Tiffany Chen, from [facility], is requesting for pt's lab results dated 10/31 be faxed to [facility] and [facility]. Please advise, thank you.    CB#: [phone]  [facility] Fax #: [phone]  [facility] Fax #: [phone]

## 2020-11-07 NOTE — Telephone Encounter (Signed)
Tiffany Chen calling back from [facility] in regards to previous message and requested it be sent urgent. Marking as high priority, she says [facility] wants it urgent. Please assist, thank you    CB# [phone]  [facility] Fax #: [phone]  [facility] Fax #: [phone]

## 2020-11-07 NOTE — Interdisciplinary (Signed)
PFT Lab note:  Sputum was induced using 10% saline. Sample sent to lab.

## 2020-11-09 ENCOUNTER — Ambulatory Visit
Admission: RE | Admit: 2020-11-09 | Discharge: 2020-11-09 | Disposition: A | Discharge status: Home / Self Care | Payer: Medicare Other | Attending: Critical Care Medicine | Admitting: Critical Care Medicine

## 2020-11-09 ENCOUNTER — Telehealth (HOSPITAL_BASED_OUTPATIENT_CLINIC_OR_DEPARTMENT_OTHER): Payer: Self-pay | Admitting: Critical Care Medicine

## 2020-11-09 ENCOUNTER — Ambulatory Visit (HOSPITAL_COMMUNITY)
Admission: RE | Admit: 2020-11-09 | Discharge: 2020-11-09 | Disposition: A | Discharge status: Home Health | Payer: Medicare Other

## 2020-11-09 DIAGNOSIS — A319 Mycobacterial infection, unspecified: Secondary | ICD-10-CM

## 2020-11-09 DIAGNOSIS — J984 Other disorders of lung: Secondary | ICD-10-CM | POA: Insufficient documentation

## 2020-11-09 DIAGNOSIS — A310 Pulmonary mycobacterial infection: Secondary | ICD-10-CM | POA: Insufficient documentation

## 2020-11-09 NOTE — Interdisciplinary (Signed)
Sputum was obtained via 10% Saline neb and. Sputum was taken  To the lab.

## 2020-11-09 NOTE — Telephone Encounter (Signed)
Josh calling from Home health in regards to 3 pending orders that have not been sign by Dr Fransico Michael.  Josh stated rders were last fax on 10/25. Josh stated he would re-fax orders today and to  please have them complete, sign  and fax back. Please review and assist.  Thank you  Ph [phone]  Fax [phone]

## 2020-11-09 NOTE — Telephone Encounter (Signed)
Donata Clay, Pharmacologist with [facility], is calling with regards to patient's amikacin (AMIKIN) 650 mg in dextrose 100 mL IVPB. Per Donata Clay, she is yet to receive the DC order for end of therapy. She is requesting for the order to be sent to fax#[phone]. She is not requesting a call back, but can be reached at ph#[phone] should there be any further questions. No call reference number or extension was provided. Please advise.

## 2020-11-10 ENCOUNTER — Other Ambulatory Visit
Admission: RE | Admit: 2020-11-10 | Discharge: 2020-11-10 | Disposition: A | Discharge status: Home / Self Care | Payer: Medicare Other | Attending: Critical Care Medicine | Admitting: Critical Care Medicine

## 2020-11-10 DIAGNOSIS — A319 Mycobacterial infection, unspecified: Secondary | ICD-10-CM | POA: Insufficient documentation

## 2020-11-10 LAB — CBC WITH DIFF, BLOOD
ANC-Automated: 3.7 10*3/uL (ref 1.6–7.0)
Abs Basophils: 0.1 10*3/uL (ref <0.2)
Abs Eosinophils: 0.3 10*3/uL (ref 0.0–0.5)
Abs Lymphs: 1.3 10*3/uL (ref 0.8–3.1)
Abs Monos: 0.4 10*3/uL (ref 0.2–0.8)
Basophils: 1 %
Eosinophils: 4 %
Hct: 34.8 % (ref 34.0–45.0)
Hgb: 10.9 gm/dL — ABNORMAL LOW (ref 11.2–15.7)
Lymphocytes: 22 %
MCH: 27.5 pg (ref 26.0–32.0)
MCHC: 31.3 g/dL — ABNORMAL LOW (ref 32.0–36.0)
MCV: 87.9 um3 (ref 79.0–95.0)
MPV: 10.2 fL (ref 9.4–12.4)
Monocytes: 8 %
Plt Count: 289 10*3/uL (ref 140–370)
RBC: 3.96 10*6/uL (ref 3.90–5.20)
RDW: 13.3 % (ref 12.0–14.0)
Segs: 65 %
WBC: 5.8 10*3/uL (ref 4.0–10.0)

## 2020-11-10 LAB — COMPREHENSIVE METABOLIC PANEL, BLOOD
ALT (SGPT): 13 U/L (ref 0–33)
AST (SGOT): 20 U/L (ref 0–32)
Albumin: 4.5 g/dL (ref 3.5–5.2)
Alkaline Phos: 59 U/L (ref 40–130)
Anion Gap: 13 mmol/L (ref 7–15)
BUN: 15 mg/dL (ref 8–23)
Bicarbonate: 25 mmol/L (ref 22–29)
Bilirubin, Tot: 0.32 mg/dL (ref <1.2)
Calcium: 9.4 mg/dL (ref 8.5–10.6)
Chloride: 102 mmol/L (ref 98–107)
Creatinine: 0.51 mg/dL (ref 0.51–0.95)
Glucose: 126 mg/dL — ABNORMAL HIGH (ref 70–99)
Potassium: 5 mmol/L (ref 3.5–5.1)
Sodium: 140 mmol/L (ref 136–145)
Total Protein: 7.2 g/dL (ref 6.0–8.0)
eGFR Based on CKD-EPI 2021 Equation: >60 mL/min

## 2020-11-10 LAB — RESPIRATORY CULTURE W/GRAM STAIN: Respiratory Culture Result: NORMAL

## 2020-11-10 LAB — AMIKACIN, PEAK: Amikacin, Peak: 26.8 ug/mL — ABNORMAL HIGH (ref 20.0–25.0)

## 2020-11-10 NOTE — Telephone Encounter (Addendum)
MACRN: pt has f/u appt [facility] clinic on 11/14/20 to evaluate treatment  Will fax communication of end date at that time    [facility] called to relay information-left detailed message for pharmacist Dominic-keep same dose: AP level is at goal (20-30)  Amikacin Peak level 11/4-26.8

## 2020-11-11 LAB — RESPIRATORY CULTURE W/GRAM STAIN: Respiratory Culture Result: NORMAL

## 2020-11-13 ENCOUNTER — Other Ambulatory Visit
Admission: RE | Admit: 2020-11-13 | Discharge: 2020-11-13 | Disposition: A | Discharge status: Home / Self Care | Payer: Medicare Other | Attending: Critical Care Medicine | Admitting: Critical Care Medicine

## 2020-11-13 ENCOUNTER — Ambulatory Visit (INDEPENDENT_AMBULATORY_CARE_PROVIDER_SITE_OTHER): Payer: Medicare Other | Admitting: Audiology

## 2020-11-13 ENCOUNTER — Telehealth (HOSPITAL_BASED_OUTPATIENT_CLINIC_OR_DEPARTMENT_OTHER): Payer: Self-pay | Admitting: Critical Care Medicine

## 2020-11-13 DIAGNOSIS — A310 Pulmonary mycobacterial infection: Secondary | ICD-10-CM | POA: Insufficient documentation

## 2020-11-13 DIAGNOSIS — H9313 Tinnitus, bilateral: Secondary | ICD-10-CM

## 2020-11-13 DIAGNOSIS — A319 Mycobacterial infection, unspecified: Secondary | ICD-10-CM

## 2020-11-13 DIAGNOSIS — H903 Sensorineural hearing loss, bilateral: Secondary | ICD-10-CM

## 2020-11-13 LAB — COMPREHENSIVE METABOLIC PANEL, BLOOD
ALT (SGPT): 9 U/L (ref 0–33)
AST (SGOT): 20 U/L (ref 0–32)
Albumin: 4.3 g/dL (ref 3.5–5.2)
Alkaline Phos: 59 U/L (ref 40–130)
Anion Gap: 8 mmol/L (ref 7–15)
BUN: 18 mg/dL (ref 8–23)
Bicarbonate: 28 mmol/L (ref 22–29)
Bilirubin, Tot: 0.36 mg/dL (ref <1.2)
Calcium: 9.7 mg/dL (ref 8.5–10.6)
Chloride: 103 mmol/L (ref 98–107)
Creatinine: 0.52 mg/dL (ref 0.51–0.95)
Glucose: 134 mg/dL — ABNORMAL HIGH (ref 70–99)
Potassium: 4.6 mmol/L (ref 3.5–5.1)
Sodium: 139 mmol/L (ref 136–145)
Total Protein: 7 g/dL (ref 6.0–8.0)
eGFR Based on CKD-EPI 2021 Equation: >60 mL/min

## 2020-11-13 LAB — CBC WITH DIFF, BLOOD
ANC-Automated: 3.7 10*3/uL (ref 1.6–7.0)
Abs Basophils: 0.1 10*3/uL (ref <0.2)
Abs Eosinophils: 0.2 10*3/uL (ref 0.0–0.5)
Abs Lymphs: 1.3 10*3/uL (ref 0.8–3.1)
Abs Monos: 0.4 10*3/uL (ref 0.2–0.8)
Basophils: 1 %
Eosinophils: 3 %
Hct: 33.4 % — ABNORMAL LOW (ref 34.0–45.0)
Hgb: 10.3 gm/dL — ABNORMAL LOW (ref 11.2–15.7)
Lymphocytes: 23 %
MCH: 27.3 pg (ref 26.0–32.0)
MCHC: 30.8 g/dL — ABNORMAL LOW (ref 32.0–36.0)
MCV: 88.6 um3 (ref 79.0–95.0)
MPV: 10.2 fL (ref 9.4–12.4)
Monocytes: 8 %
Plt Count: 290 10*3/uL (ref 140–370)
RBC: 3.77 10*6/uL — ABNORMAL LOW (ref 3.90–5.20)
RDW: 13.4 % (ref 12.0–14.0)
Segs: 66 %
WBC: 5.6 10*3/uL (ref 4.0–10.0)

## 2020-11-13 LAB — AMIKACIN, PEAK: Amikacin, Peak: 25.6 ug/mL — ABNORMAL HIGH (ref 20.0–25.0)

## 2020-11-13 NOTE — Telephone Encounter (Signed)
Tiffany Chen calling from [facility] in regards to an order to discontinue PICC LINE or is the Dr going to continue the PICC line at the office. Pt has an appt on 11/8. She would like a call back at # [phone]. Please assist, thank you

## 2020-11-13 NOTE — Telephone Encounter (Signed)
MACRN: callled back-will contact tomorow after pt has clinic visit.

## 2020-11-13 NOTE — Progress Notes (Signed)
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  She reported the [facility] line for the IV is planned to be removed tomorrow and she'll be switching to inhaled amikacin.  No changes in her hearing.  No change in her intermittent bilateral tinnitus.  Denied dizziness and aural pain/pressure/drainage.    Otoscopy  Unremarkable for either ear.    Results  Testing for the left ear indicated normal hearing [phone] Hz, borderline normal hearing at 2000 Hz, sloping from mild sensorineural hearing loss 03-5998 Hz to moderate hearing loss at 8000 Hz.  Testing for the right ear indicated normal hearing [phone] Hz, sloping from mild sensorineural hearing loss 02-5998 Hz to moderate hearing loss at 8000 Hz.  No asymmetries were present.  Speech discrimination was good (88%) in the left ear and excellent (96%) in the right ear at elevated presentation levels.    Ultra high frequencies showed moderately-severe to severe hearing loss 9-16,000 Hz, with no response at the limits of the audiometer at 16,000 Hz.    DNT immittance as patient was non-symptomatic for middle ear pathology.    Comparison to previous audio done 11/01/20 showed stable acuity for both ears.    Recommendations  Patient to f/u with Dr. Katheren Shams or his staff as scheduled.  Patient should inquire if monitoring audiograms will switch to every 3 months now or if there is a different schedule in mind.      Audiogram graph is available for view under media.    Orrin Brigham, Cletis Athens., CCC/A  Sr. Audiologist  Licensed Dispensing Audiologist  [facility]

## 2020-11-13 NOTE — Telephone Encounter (Signed)
Nider is calling for [facility] in regards to antibiotics. Today 11/07 is the last dose and has not received any orders to continue or discontinue. Nider is requesting a call back. Nurse Nider is currently with the patient and would like to be advised if they are able to pull the PICC line since they are already there.  Please assist. Thank you     Best Call Number: [phone]    Best Call Back: anytime

## 2020-11-13 NOTE — Progress Notes (Deleted)
PULMONARY OUTPATIENT CLINIC FOLLOW-UP NOTE    No chief complaint on file.      History of Present Illness:  Tiffany Chen is a 75 year old female with history of bronchiectasis, cavitary MAC lung infection, ruptured diverticulitis s/p colostomy in 2009, DM, ICH s/p embolization. She is here to follow up on bronchiectasis and MAC lung infection.      Patient's last visit on 10/18/2020 with [name] as new patient for bronchiectasis and MAC lung infection evaluation. I saw Tiffany Chen the last on 08/29/2020 to follow up on bronchiectasis and MAC lung infection. She states doing well from a respiratory stand point since last visit. She denies ED visits or recent hospitalizations.       Tiffany Chen has history of MAC lung infection treated multiple times with azithromycin 523m, Rifampin 6072m, ethambutol 160076mhree times weekly: 2018- 01/19/2018, 05/25/2018- 10/22/2019 and 12/22/2019- 02/16/2020.      In late 2021, CT scan showed enlarging cavity. December 2021 a bronch was performed but no MAC growth. Her doctor restarted azithro/Rifampin/ethmabutol based on imaging.   Feb 2022 she saw [name] stopped the antibiotics based on the lack of microbiologic data. Performed repeat bronch 04/28/2020 which has smear AFB 3+ and culture MAC (drug susc pending).      She was strongly recommended to start new round of MAC antibiotic therapy due to the severity of the lung cavitation and severity of her symptoms. The regimen recommended was IV antibiotic (Amikacin) home infusion for 3 months, azithromycin 250 mg daily, ethambutol 800 mg daily and rifampin 600 mg daily.      She started on MAC antibiotic therapy on 08/07/2020. She has been tolerating well MAC antibiotics. She reports vomiting once. She also has noticing diarrhea and weight loss since started on MAC antibiotic. She denies other additional side effects with MAC antibiotic. She have had recent audiogram and EKG on 08/23/2020.     As per last  visit with Dr. [name] 10/18/2020 was planned to stop IV antibiotic (Amikacin) on 11/14/2020 after 3 months of treatment and to replace with Arykace. Also was discussed to consider RUL lobectomy.      Tiffany Chen been submitting sputum samples routinely every month by induction, lab work every month, Audiogram every 2 weeks and EKG every 3 months.      Today, Tiffany Chen feeling well not but a her baseline. She currently reports slight increased non productive cough predominantly during the night but in general she has noticed improved cough ever since started on MAC antibiotic. She has been taking MAC antibiotics religiously. She has been doing air way clearance twice a day with saline solution twice a day.She denies wheezing or dyspnea. She denies hemoptysis, fevers, chills, nausea, vomiting. She reports night sweats.  She is currently capable to walk 1 mile without feeling winded. She denies feeling winded when going uphill or on stairs. Sometimes she has to stop an rest. She reports intermittent chest pain, but denies chest congestion, orthopnea, PND lower extremity edema. She reports history of palpitations. She denies allergic rhinitis, sinus problems or other URI. She has history of GERD controlled with diet and PPI. She have had influenza vaccine on fall 2021 and competed COVID vaccines.     Review of Systems:   A full  review of systems was performed and the pertinent positives and negatives were mentioned in the HPI, all other review of systems were negative.     Past  Medical History:   Diagnosis Date   . Cavitary lesion of lung 07/24/2020   . DM (diabetes mellitus) (CMS-HCC)    . 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    No family history on file.    Social History     Socioeconomic History   . Marital status: Widowed   Tobacco Use   . Smoking status: Never   . Smokeless tobacco: Never       Allergies   Allergen Reactions   .  [employer]  Other       Current Outpatient Medications on File Prior to Visit   Medication Sig Dispense Refill   . albuterol 108 (90 Base) MCG/ACT inhaler Inhale 2 puffs by mouth every 6 hours as needed for Wheezing. 3 each 3   . amikacin (AMIKIN) 650 mg in dextrose 100 mL IVPB Inject 650 mg into vein three times a week.     Derrill Memo ON 11/20/2020] Amikacin Sulfate Liposome (ARIKAYCE) 590 MG/8.4ML SUSP 590 mg by Nebulization route daily. 252 mL 11   . atorvastatin (LIPITOR) 10 MG tablet Take 10 mg by mouth daily.     [name] azithromycin (ZITHROMAX) 250 MG tablet Take 1 tablet (250 mg) by mouth daily. 30 tablet 11   . ethambutol (MYAMBUTOL) 400 MG tablet Take 2 tablets (800 mg) by mouth daily. 60 tablet 11   . fluoxetine (PROZAC) 20 MG tablet Take 20 mg by mouth daily.     [name] FLUOXETINE HCL PO 30 mg     . metFORMIN (GLUCOPHAGE) 1000 mg tablet Take 1,000 mg by mouth 2 times daily (with meals).     . metFORMIN (GLUCOPHAGE) 500 mg tablet Take 1 tablet (500 mg) by mouth 2 times daily (with meals).     [name] omeprazole (PRILOSEC) 20 MG capsule Take 1 capsule (20 mg) by mouth every morning (before breakfast). 90 capsule 1   . Pediatric Multivitamins-Fl (MULTIVITAMINS/FL PO)      . ramipril (ALTACE) 2.5 MG capsule Take 2.5 mg by mouth daily.     . rifampin (RIFADIN) 300 MG capsule Take 2 capsules (600 mg) by mouth daily. 60 capsule 11   . sodium chloride 7 % NEBU Inhale 4 mL by mouth every 12 hours.     . sodium chloride 7 % NEBU 4 mL by Nebulization route every 12 hours. 240 mL 11     No current facility-administered medications on file prior to visit.       Physical Examination:  There were no vitals taken for this visit.           General: well appearing alert and oriented x 3, in nad.  Oropharynx: without any lesions  Heart:  Regular rate and rhythm, normal S1 S2, no murmurs.  Lungs: clear to auscultation, right upper lobe wheezing, rales, rhonchi, no chest deformities noted. Normal I:E ratio  Extremities:  no cyanosis,  clubbing, or edema.  Skin:  No rashes or lesions.  Psych: normal affect and mood       I have reviewed the following  laboratory data and other diagnostic studies:   CBC:  Lab Results   Component Value Date    WBC 5.8 11/10/2020    HGB 10.9 (L) 11/10/2020    HCT 34.8 11/10/2020    PLT 289 11/10/2020     CHEM:  Lab Results   Component Value Date    NA 140 11/10/2020  K 5.0 11/10/2020    CL 102 11/10/2020    BICARB 25 11/10/2020    BUN 15 11/10/2020    CREAT 0.51 11/10/2020    GLU 126 (H) 11/10/2020    [place] 9.4 11/10/2020     COAG:  No results found for: PT, PTT, INR  LFTs:  Lab Results   Component Value Date    AST 20 11/10/2020    ALT 13 11/10/2020    ALK 59 11/10/2020    TP 7.2 11/10/2020    ALB 4.5 11/10/2020    TBILI 0.32 11/10/2020    DBILI <0.2 07/17/2020          Review of Radiology Studies:    Pulmonary Function Test Review:  04/28/2020 (care everywhere): noraml spito, normal volumes, mildly reduced DLCO    Radiology Review:    CT chest (11/09/2020):  FINDINGS:  Lines and Tubes: PICC ends in the left brachiocephalic vein    Thyroid and thoracic inlet: Normal.  Lymph nodes: Lymph nodes have increased in size. 8 mm AP window lymph node measured 4 mm on the prior study. 8 mm lymph node adjacent to the superior vena cava measured 6 mm on the prior study. Other lymph nodes are unchanged.  Cardiovascular: Atherosclerotic disease is relatively mild for the patient's age.  Pericardium: Small pericardial effusion  Esophagus: Normal.    Lung: Comparison to the outside study is limited due to poor spatial resolution on that study given 5 mm slice thickness versus 1.607 mm slice thickness. New thick walled cavitary 2.6 cm right apical nodule on series 2, image 393 adjacent 12 mm thick walled cavitary nodule is new. Worsening tree-in-bud nodularity and bronchiectasis in the right upper lobe. Thick walled cavity in the posterior segment of the right upper lobe measuring 3.1 cm is unchanged in size but demonstrates  increased wall thickness. Surrounding tree-in-bud nodularity is increased. Cavitary nodule in the right middle lobe is unchanged. Bronchiectasis may be increased but is again difficult to assess given differences in slice thickness. Worsening tree-in-bud nodularity throughout the right middle and right lower lobes. Worsening nodular consolidation in the medial aspect of the right middle lobe. New areas of nodularity throughout the left lung.  Pleura: Normal.    Abdomen: 1.2 cm hypoattenuating nodule in the liver. This is unchanged    Bone and soft tissue: No acute abnormality.    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    CT chest 04/13/2020:   CONCLUSION:   1. 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   2. Cavitary lesion in the periphery of the right lung has increased in size measuring 2.7 cm, although may have a slightly less thickened and nodular wall.   3. Diffuse bronchiectasis most pronounced in the right upper lobe.     Microbiology  BAL 04/28/2020 (from Dr. Cecile Hearing): AFB smear 3+, culture MAC (drug susceptibly testing sent to [facility]- pending)      ASSESSMENT AND PLAN:  Carnetta Losada is a 75 year old female with history of bronchiectasis, cavitary MAC lung infection, ruptured diverticulitis s/p colostomy in 2009, DM, ICH s/p embolization. She is here to follow up on bronchiectasis and MAC lung infection.     # Bronchiectasis/ Cavitary MAC lung infection: currently stable with mild symptoms. She reports mild cough and fatigue. She was advised to start MAC antibiotic therapy due  to the severity of her lesions and symptoms. She started on  IV antibiotic Amikacin home infusion for 3 months, azithromycin 250 mg daily, ethambutol 800 mg daily and rifampin 600 mg daily. She denies mayor side effects and  seems tolerating well antibiotic therapy. PFT from 04/28/2020 ( outside) wnl, mildly reduced DLCO, CT Chest from 04/13/2020 with micronodular and tree in bud opacity consistent with MAC infection. Cavitary lesion to RUL about 2.7 cm. Sputum cultures from 08/03/2020 with preliminary positive result, two additional negative preliminary results. I have discussed air way clearance and MAC antibiotic therapy. I have recommended her to continue with air way clearance twice a day with saline nebulizer solution followed by acapella device ( device given during the visit). Patient was instructed to use albuterol inhaler 2 puffs before air way clearance. I have recommended her continue with Amikacin home infusion for 3 months, azithromycin 250 mg daily, ethambutol 800 mg daily and rifampin 600 mg daily. I have reminded her to continue with lab work ( draw from PICC line) twice a week, sputum cultures for AFB and respiratory cultures x 3 per month, EKG and Audiogram every 3-4 months ( due on 11/2020) and to continue with ishihara discrimination color chart. Patient was advised to monitor for respiratory symptoms and call back if any changes. ED precautions given.     # Immunizations: up to date.     "I personally spend a total of 45 Minutes in face to face and non face to face activities related to patient's visit today, excluding and separately reportable services/procedures."    Follow up with [name] in 2 months.     Zella Richer NP-BC  Pulmonary Medicine

## 2020-11-13 NOTE — Telephone Encounter (Signed)
Karina/[facility] has called requesting discharge order for amikacin (AMIKIN) 650 mg in dextrose 100 mL IVPB (Order# [id number])    Best call back ph# [phone]  Fx# [phone]

## 2020-11-13 NOTE — Telephone Encounter (Signed)
Message routed to RN Candy

## 2020-11-14 ENCOUNTER — Ambulatory Visit (HOSPITAL_BASED_OUTPATIENT_CLINIC_OR_DEPARTMENT_OTHER): Payer: Medicare Other | Admitting: Nurse Practitioner

## 2020-11-14 ENCOUNTER — Telehealth (HOSPITAL_BASED_OUTPATIENT_CLINIC_OR_DEPARTMENT_OTHER): Payer: Self-pay | Admitting: Critical Care Medicine

## 2020-11-14 ENCOUNTER — Encounter (HOSPITAL_BASED_OUTPATIENT_CLINIC_OR_DEPARTMENT_OTHER): Payer: Self-pay

## 2020-11-14 NOTE — Telephone Encounter (Signed)
MACRN:   [facility] aware pt to ED and hold on d/c picc line till our office contacts them/

## 2020-11-14 NOTE — Telephone Encounter (Signed)
MACRN:return call to [facility]  Pt completed infusions yesterday. Requesting PICC Line removal    Aware pt did not make appointment today. Despite my calling her to try to reschule and check on her I have not spoken with her.   To attempt to have her do a video visit today.   Will review with provider

## 2020-11-14 NOTE — Telephone Encounter (Signed)
Belen from [facility] to follow up on first previous message in regards to an order to discontinue PICC LINE or is the Dr going to continue the PICC line at the office.Please review and assist.  Thank you   Ph [phone]

## 2020-11-14 NOTE — Telephone Encounter (Addendum)
Symptom Call        Who is reporting the symptoms? Pt's brother    What are the symptoms: Pt is feeling ill. Pt is coughing a lot more often to the  Point they are throwing up. Soar throat and headache    Mr. Fayrene Fearing would like to be advised where to take the pt if they get worst.     Mr. Fayrene Fearing would like for Nurse Candy to please review message.     Last office visit: 10/18/2020    Pain level 0-10:  n/a     When did the symptoms start: 11/07    Where is the pain located:  n/a     Is there any bleeding: n/a     Best way to contact patient: [phone]    Is it OK to leave a voicemail: yes,     Was patient informed of turnaround time: yes     Was patient informed of ED Precautions: yes

## 2020-11-14 NOTE — Telephone Encounter (Signed)
See mychart message-with pt call  To ED.

## 2020-11-14 NOTE — Telephone Encounter (Signed)
MACRN: noted pt called to cancell appointment [facility] today-due to "illness"  Left detailed VM can do a Video visit today and determine to complete infusion.  CB and Mychart message

## 2020-11-14 NOTE — Telephone Encounter (Signed)
Patient going to [facility] ED.     From RN Taylor's note:     PT Called  Pt reports of coughing, throwing up, 100.8 fever, spo2 95%  +sob , Brother got a cold last week-now sick x1 day    She felt much better after IV Infusions-less cough and less sputum    -cough-started up again, now vomits when coughs  -sputum-returned, clear  -+sob  -has increased symptoms    -Instructed to go to ED. Keep Picc in for now till instructed  Will call Home Health agency

## 2020-11-14 NOTE — Telephone Encounter (Signed)
PT Called  Pt reports of coughing, throwing up, 100.8 fever, spo2 95%  +sob , Brother got a cold last week-now sick x1 day    She felt much better after IV Infusions-less cough and less sputum    -cough-started up again, now vomits when coughs  -sputum-returned, clear  -+sob  -has increased symptoms    -Instructed to go to ED. Keep Picc in for now till instructed  Will call Home Health agency

## 2020-11-14 NOTE — Telephone Encounter (Signed)
Call back pt to ED.   Coram aware on hold

## 2020-11-15 ENCOUNTER — Telehealth (HOSPITAL_BASED_OUTPATIENT_CLINIC_OR_DEPARTMENT_OTHER): Payer: Self-pay

## 2020-11-15 DIAGNOSIS — A310 Pulmonary mycobacterial infection: Secondary | ICD-10-CM

## 2020-11-15 DIAGNOSIS — J479 Bronchiectasis, uncomplicated: Secondary | ICD-10-CM

## 2020-11-15 DIAGNOSIS — J984 Other disorders of lung: Secondary | ICD-10-CM

## 2020-11-15 NOTE — Telephone Encounter (Addendum)
CT chest 11/09/2020 significantly worse than CT from 04/13/2020.     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    RN to call patient:   - inform of CT scan which is worse: new cavity in the right upper lobes despite the aggressive antibiotic regimen. However, the CT scan to which it was compared is the one from April 2022. So there is a possibility that if she had had a CT scan in July, immediately prior to starting treatment, that would have looked much worse than the April CT scan. So it is not 100% sure that the CT scan is worsening despite antibiotics.   - refer to Interventional pulm STAT to perform bronchoscopy to obtain a deeper sample to check for other microbes which might be causing this worsening.   - did she end up going to ED? (See RN note from yesterday: more cough, vomiting, fever).   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 Her levels are normal so this is not affecting her lung disease. But her family should know so that they can screen for alpha-1-antitrypsin deficiency.

## 2020-11-15 NOTE — Telephone Encounter (Signed)
MACRN: f/u call pt to ED yesterday

## 2020-11-16 ENCOUNTER — Telehealth (HOSPITAL_BASED_OUTPATIENT_CLINIC_OR_DEPARTMENT_OTHER): Payer: Self-pay | Admitting: Pulmonary Medicine

## 2020-11-16 NOTE — Telephone Encounter (Signed)
MACRN: pt called-no answer-left VM x2 phone#'s llisted

## 2020-11-16 NOTE — Telephone Encounter (Signed)
Placed call and LVM to schedule consult w/IP.

## 2020-11-20 ENCOUNTER — Telehealth (HOSPITAL_BASED_OUTPATIENT_CLINIC_OR_DEPARTMENT_OTHER): Payer: Self-pay | Admitting: Critical Care Medicine

## 2020-11-20 DIAGNOSIS — A319 Mycobacterial infection, unspecified: Secondary | ICD-10-CM

## 2020-11-20 NOTE — Telephone Encounter (Signed)
PICC Line Discontinuation order faxed to [facility] as requested.  Fax [phone].

## 2020-11-20 NOTE — Telephone Encounter (Signed)
MACRN:   Pt called to reevaluate s/p 3 months of IV Amikacin     She is recovering from a cold in last week    Symptoms:  -cough-small residual cough  -sputum-less  -breathing improved  -fatigue is better-was able to go out and walk    -Overall feeling improved and better than when started 3 months ago since started on IV antibiotic  -she is concerned regarding the CT scan from 11/09/20-requesting MD review    Anselm Pancoast at [facility]-will review case with MD and call with recommendation.

## 2020-11-20 NOTE — Telephone Encounter (Signed)
Tiffany Chen from [facility] calling in regards to messages left below. Tiffany Chen mentions pts nurse will be at their home at around 12 pm today therefore Tiffany Chen requesting picc line removal order be called in for verbal at ph#:[phone]  , Tiffany Chen requested high priority message be sent as pts nurse will be at home shortly.   Please assist, Thank you.

## 2020-11-20 NOTE — Telephone Encounter (Signed)
[facility] calling back to get update. Please review previous TE...FYI

## 2020-11-20 NOTE — Telephone Encounter (Addendum)
MACRN: called [facility] -ok d/c picc line  Will fax order  Noted [facility] requesting dc picc line for 11/15 due to staffing

## 2020-11-20 NOTE — Telephone Encounter (Signed)
Tesa calling from [facility] requesting an order from Dr Fransico Michael  To removes Pt's Midline. Tesa stated Nurse is at Pt's home right now.  Please review and assist.  Thank you   Ph [phone] for verbal   Fax [phone]

## 2020-11-20 NOTE — Telephone Encounter (Signed)
Message routed to RN Candy

## 2020-11-20 NOTE — Telephone Encounter (Signed)
Tiffany Chen with [facility] is calling back regarding the previous messages. She reiterated that the patient has a nurse coming today, and that they would like to know if patient's PICC line is going to be removed. Jomarie Longs is requesting a call back for further instruction, and can be reached at ph#[phone]. No call reference number or extension was provided. Please advise.

## 2020-11-21 ENCOUNTER — Encounter (HOSPITAL_BASED_OUTPATIENT_CLINIC_OR_DEPARTMENT_OTHER): Payer: Self-pay

## 2020-11-21 IMAGING — CR [HOSPITAL] SKULL
1 series · 2 of 2 positions shown · non-contrast
Comparison: None

HISTORY: MRI clearance
TECHNIQUE: Skull 2 views; No Charge

[Series 1: pa · 0.17mm/px · 2 of 2 slices shown]
[im 1/2]
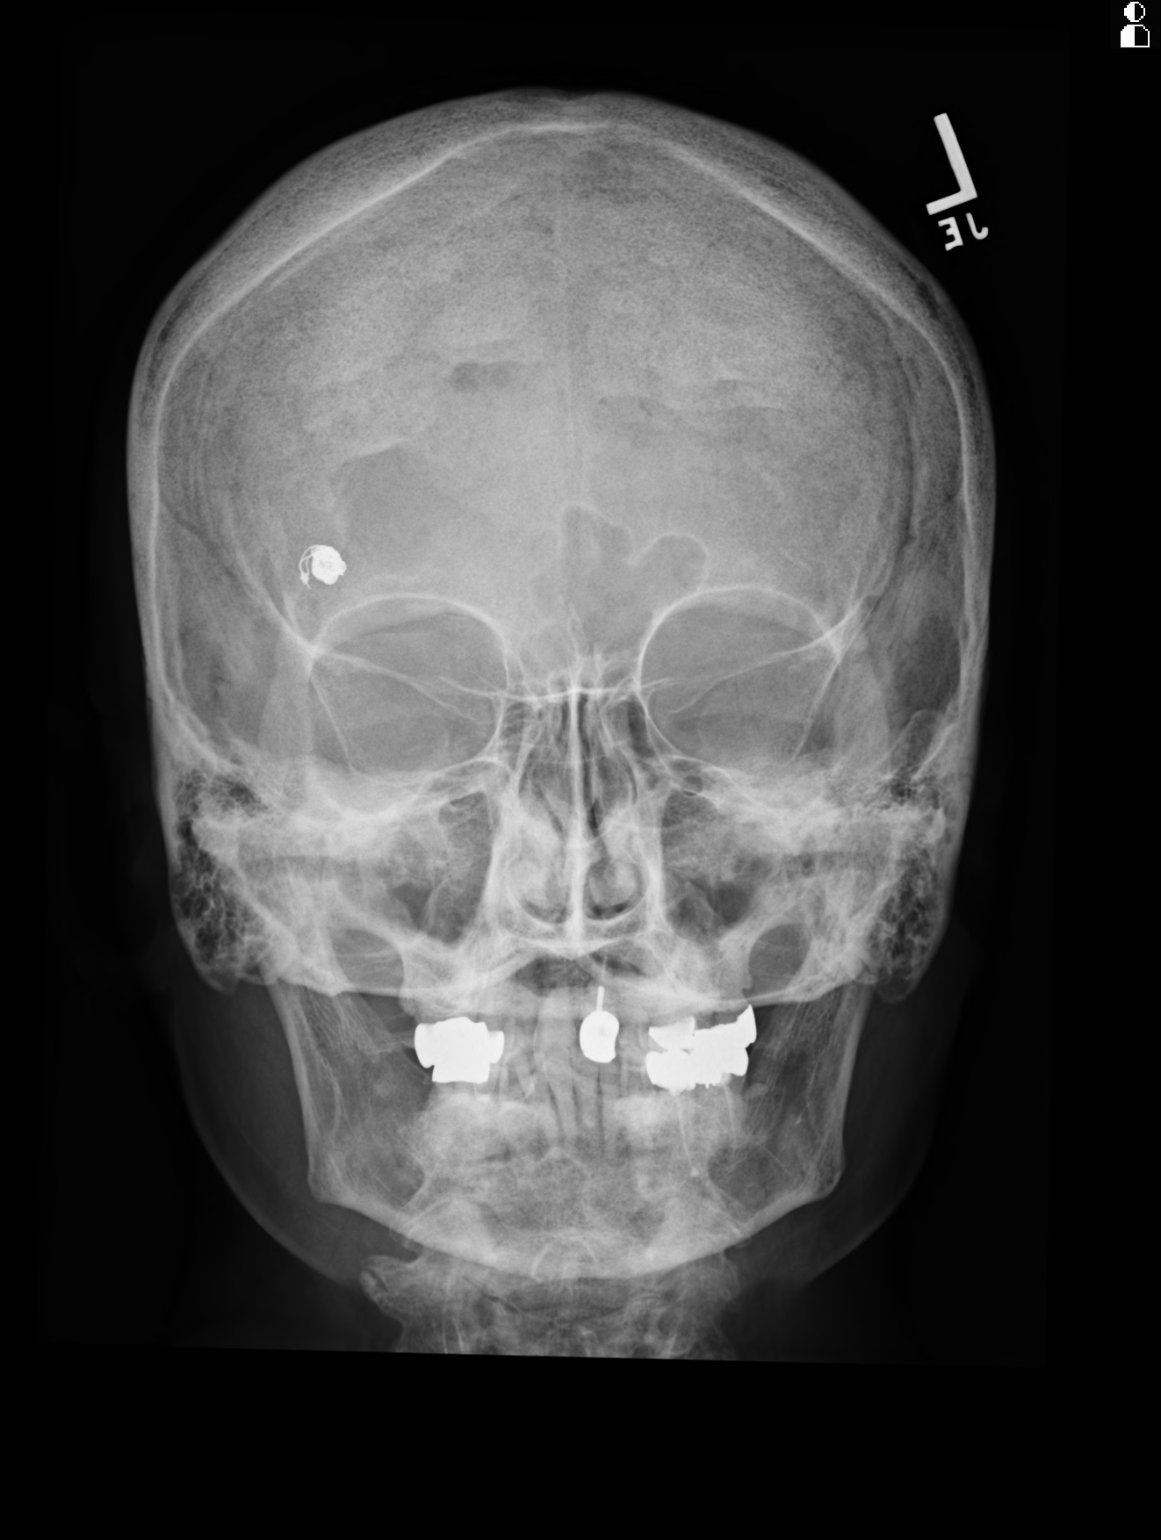
[im 2/2]
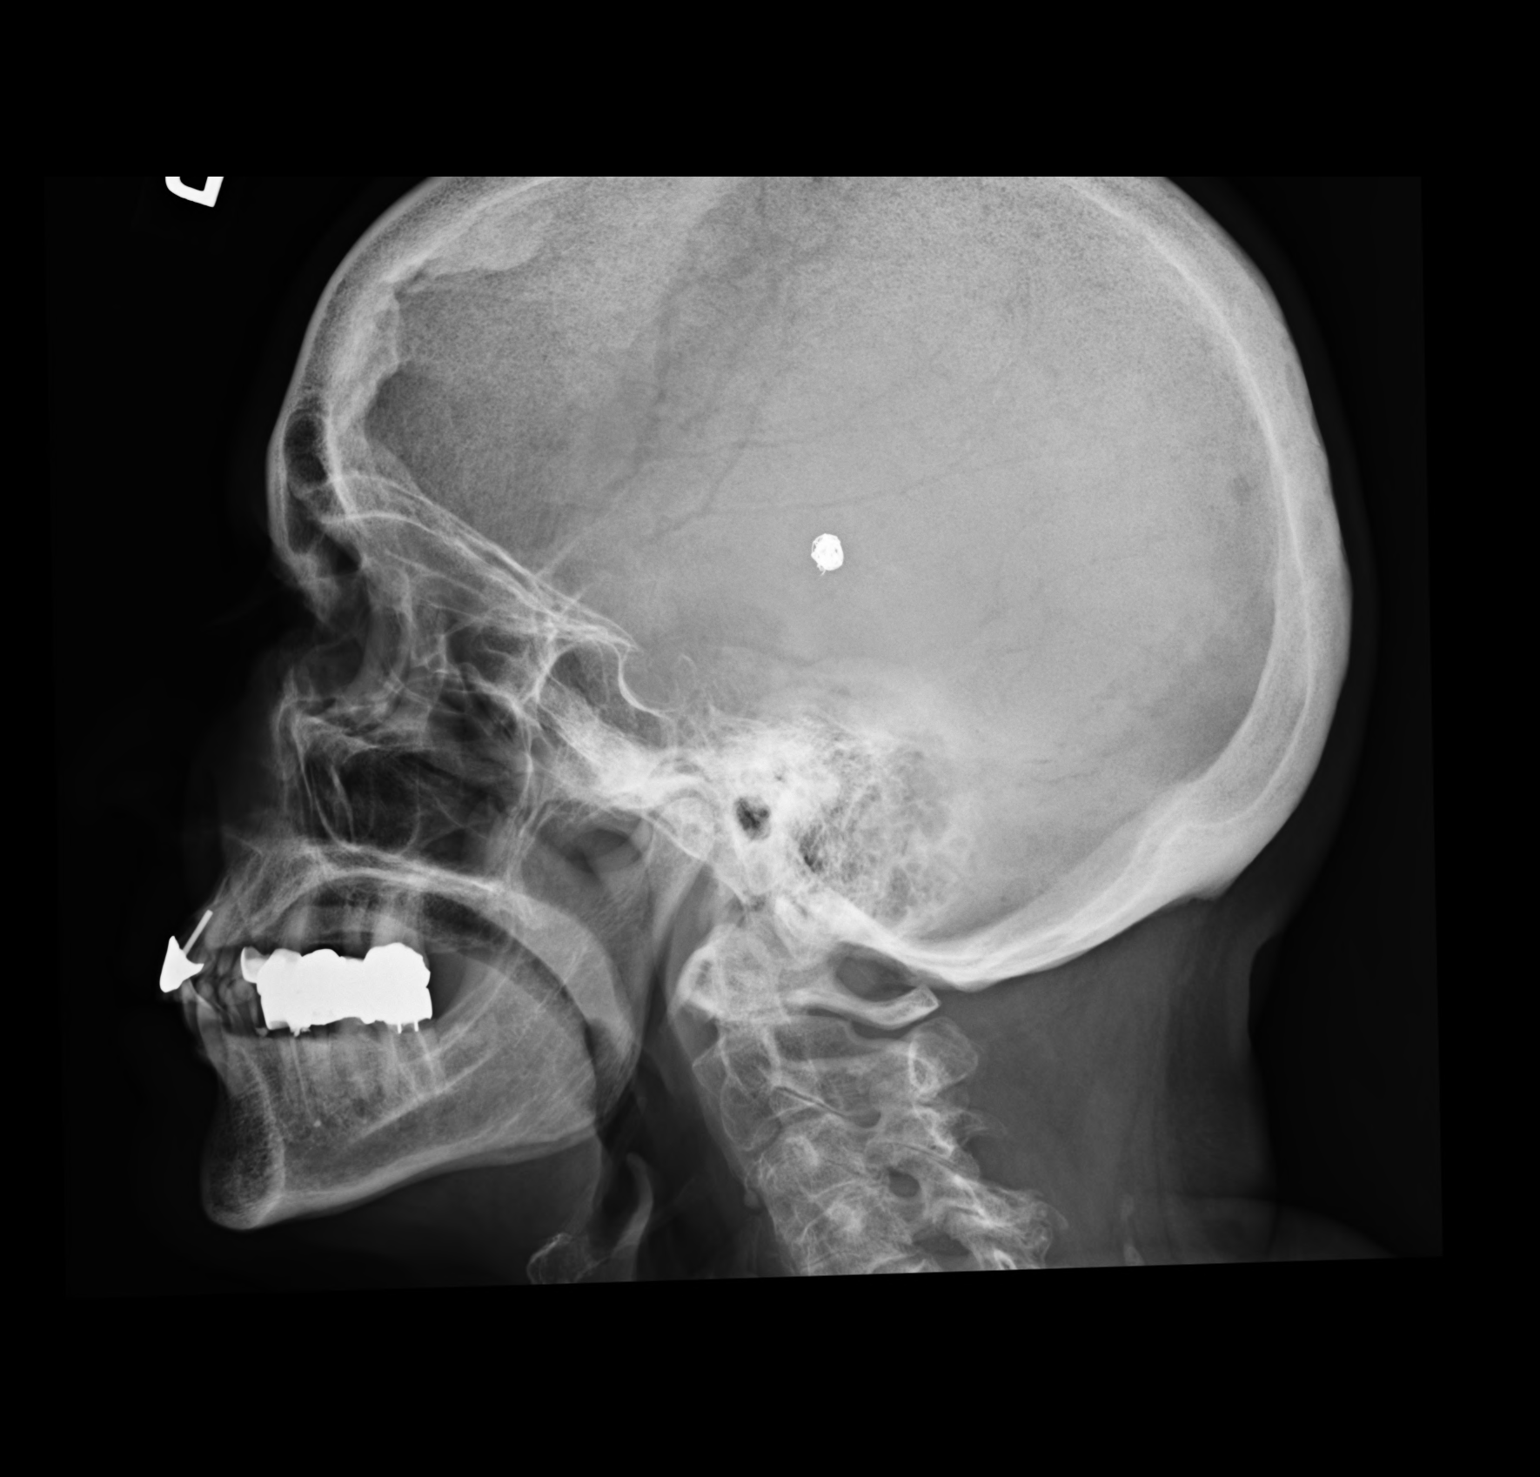

[2 of 2 positions shown; findings below may reference images not displayed]

FINDINGS: 2 views of the skull demonstrate aneurysm coils to the right of midline. No other metallic foreign bodies.
IMPRESSION: Patient maybe safely placed into the MRI unit.

## 2020-11-21 NOTE — Telephone Encounter (Signed)
MACRN: Pt called  Reviewed plan of care  -Bronchoscopy  -Chest Ct resulted  -Alpha 1 familial testing    She is thankful for the call    Noted will have PICC Line removed today.  [facility] to [facility].

## 2020-11-22 ENCOUNTER — Telehealth (HOSPITAL_BASED_OUTPATIENT_CLINIC_OR_DEPARTMENT_OTHER): Payer: Self-pay | Admitting: Pulmonary Medicine

## 2020-11-22 NOTE — Telephone Encounter (Signed)
Called and LVM to schedule consult with IP

## 2020-11-27 ENCOUNTER — Telehealth (HOSPITAL_BASED_OUTPATIENT_CLINIC_OR_DEPARTMENT_OTHER): Payer: Self-pay | Admitting: Pulmonary Medicine

## 2020-11-27 ENCOUNTER — Encounter (HOSPITAL_BASED_OUTPATIENT_CLINIC_OR_DEPARTMENT_OTHER): Payer: Self-pay | Admitting: Pulmonary Medicine

## 2020-11-27 NOTE — Telephone Encounter (Signed)
Placed call and LVM to schedule consult w/IP.

## 2020-11-29 ENCOUNTER — Encounter (HOSPITAL_BASED_OUTPATIENT_CLINIC_OR_DEPARTMENT_OTHER): Payer: Self-pay

## 2020-11-29 ENCOUNTER — Encounter (HOSPITAL_BASED_OUTPATIENT_CLINIC_OR_DEPARTMENT_OTHER): Payer: Self-pay | Admitting: Hospital

## 2020-11-29 DIAGNOSIS — A310 Pulmonary mycobacterial infection: Secondary | ICD-10-CM

## 2020-12-01 ENCOUNTER — Other Ambulatory Visit: Payer: Self-pay

## 2020-12-04 NOTE — Progress Notes (Signed)
PULMONARY OUTPATIENT CLINIC FOLLOW-UP NOTE    Chief Complaint   Patient presents with    Recheck       History of Present Illness:  Tiffany Chen is a 75 year old female with history of bronchiectasis, cavitary MAC lung infection, ruptured diverticulitis s/p colostomy in 2009, DM, ICH s/p embolization. She is here to follow up on bronchiectasis and MAC lung infection.      Patient's last visit on 10/18/2020 with [name] as new patient for bronchiectasis and MAC lung infection evaluation. I saw Tiffany Chen the last on 08/29/20 to follow up on bronchiectasis and MAC lung infection. She states doing well from a respiratory stand point since last visit. She denies ED visits or recent hospitalizations.       Tiffany Chen has history of MAC lung infection treated multiple times with azithromycin 561m, Rifampin 6026m, ethambutol 160045mhree times weekly: 2018- 01/19/2018, 05/25/2018- 10/22/2019 and 12/22/2019- 02/16/2020.     As per last note from Dr. [name] In late 2021, CT scan showed enlarging cavity. December 2021 a bronch was performed but no MAC growth. Her doctor restarted azithro/Rifampin/ethmabutol based on imaging.   Feb 2022 she saw [name] stopped the antibiotics based on the lack of microbiologic data. Performed repeat bronch 04/28/2020 which has smear AFB 3+ and culture MAC (drug susc pending).      During her visit from 07/25/2019 with Dr. ElmDebara Pickette was strongly recommended to start new round of MAC antibiotic therapy due to the severity of the lung cavitation and severity of her symptoms. The regimen recommended was IV antibiotic (Amikacin) home infusion for 3 months, azithromycin 250 mg daily, ethambutol 800 mg daily and rifampin 600 mg daily.      She started on MAC antibiotic therapy on 08/14/2020 with tentative plan to stop IV amikacin on 11/24/2020 and replaced with arikayce. Her IV was removed on 11/24/2020 and also discussed the possibility of right upper lobe  lobectomy.  She has been tolerating well MAC antibiotics.    11/23/2020 she started on Arykace every day. She reports mild sore throat with Arykace but was able to tolerate with hot tea.       While on therapy she was advised to have lab work twice a week while on IV antibiotic and then after every month, continue with Audiogram every 2 weeks, submit sputum samples x 3 every month, EKG every 3-4 months and continue with ishihara discrimination color test.      Today, Tiffany Chen feeling well not but a her baseline.She states feeling better after 3 months of IV amikacin. She states having more energy. She reports improved cough and now intermittent. She reports not able to produce phlegm.  In general she has noticed improved cough ever since started on MAC antibiotic. She has been taking MAC antibiotics religiously. She has been doing air way clearance once a day with saline solution twice a day.She denies wheezing or dyspnea. She denies hemoptysis, fevers, chills, nausea, vomiting or night sweats.  She is currently capable to walk more than 1 mile without feeling winded. She denies feeling winded when going uphill or on stairs. Sometimes she has to stop an rest. She denies chest congestion, orthopnea, PND lower extremity edema. She reports history of palpitations. She denies allergic rhinitis, sinus problems or other URI. She has history of GERD controlled with diet and PPI. She have had influenza vaccine on fall 2022 and competed COVID vaccines.  Lab work from 11/13/2020 with chronic anemia and no other abnormalities ( thrombocytopenia, kidney or liver toxicity). EKG from 08/23/2020 with sinus rhythm and PAC's. She have had audiogram with stable hearing on 11/13/2020 and was recommended to switch to audiogram every 3 month.     She is schedule for bronchoscopy consultation on 12/13/2020    Review of Systems:   A full  review of systems was performed and the pertinent positives and negatives were mentioned  in the HPI, all other review of systems were negative.     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    No family history on file.    Social History     Socioeconomic History    Marital status: Widowed   Tobacco Use    Smoking status: Never    Smokeless tobacco: Never       Allergies   Allergen Reactions    Psychologist, clinical Other       Current Outpatient Medications on File Prior to Visit   Medication Sig Dispense Refill    albuterol 108 (90 Base) MCG/ACT inhaler Inhale 2 puffs by mouth every 6 hours as needed for Wheezing. 3 each 3    [DISCONTINUED] amikacin (AMIKIN) 650 mg in dextrose 100 mL IVPB Inject 650 mg into vein three times a week.      Amikacin Sulfate Liposome (ARIKAYCE) 590 MG/8.4ML SUSP 590 mg by Nebulization route daily. 252 mL 11    atorvastatin (LIPITOR) 10 MG tablet Take 10 mg by mouth daily.      azithromycin (ZITHROMAX) 250 MG tablet Take 1 tablet (250 mg) by mouth daily. 30 tablet 11    ethambutol (MYAMBUTOL) 400 MG tablet Take 2 tablets (800 mg) by mouth daily. 60 tablet 11    fluoxetine (PROZAC) 20 MG tablet Take 20 mg by mouth daily.      FLUOXETINE HCL PO 30 mg      [DISCONTINUED] metFORMIN (GLUCOPHAGE) 1000 mg tablet Take 1,000 mg by mouth 2 times daily (with meals).      metFORMIN (GLUCOPHAGE) 500 mg tablet Take 1 tablet (500 mg) by mouth 2 times daily (with meals).      omeprazole (PRILOSEC) 20 MG capsule Take 1 capsule (20 mg) by mouth every morning (before breakfast). 90 capsule 1    Pediatric Multivitamins-Fl (MULTIVITAMINS/FL PO)       ramipril (ALTACE) 2.5 MG capsule Take 2.5 mg by mouth daily.      rifampin (RIFADIN) 300 MG capsule Take 2 capsules (600 mg) by mouth daily. 60 capsule 11    [DISCONTINUED] sodium chloride 7 % NEBU Inhale 4 mL by mouth every 12 hours.      sodium  chloride 7 % NEBU 4 mL by Nebulization route every 12 hours. 240 mL 11     No current facility-administered medications on file prior to visit.       Physical Examination:  BP 134/66 (BP Location: Left arm, BP Patient Position: Sitting, BP cuff size: Large)    Pulse 88    Temp 97.7 F (36.5 C) (Temporal)    Resp 18    Ht 5' 2.5" (1.588 m)    Wt 58.1 kg (128 lb)    SpO2 98%    BMI 23.04 kg/m  Oxygen Therapy  SpO2: 98 %       General: well appearing alert and oriented x 3, in nad.  Oropharynx: without any lesions  Heart:  Regular rate and rhythm, normal S1 S2, no murmurs.  Lungs: clear to auscultation, right upper lobe wheezing, rales, rhonchi, no chest deformities noted. Normal I:E ratio  Extremities:  no cyanosis, clubbing, or edema.  Skin:  No rashes or lesions.  Psych: normal affect and mood       I have reviewed the following  laboratory data and other diagnostic studies:   CBC:  Lab Results   Component Value Date    WBC 5.6 11/13/2020    HGB 10.3 (L) 11/13/2020    HCT 33.4 (L) 11/13/2020    PLT 290 11/13/2020     CHEM:  Lab Results   Component Value Date    NA 139 11/13/2020    K 4.6 11/13/2020    CL 103 11/13/2020    BICARB 28 11/13/2020    BUN 18 11/13/2020    CREAT 0.52 11/13/2020    GLU 134 (H) 11/13/2020    [place] 9.7 11/13/2020     COAG:  No results found for: PT, PTT, INR  LFTs:  Lab Results   Component Value Date    AST 20 11/13/2020    ALT 9 11/13/2020    ALK 59 11/13/2020    TP 7.0 11/13/2020    ALB 4.3 11/13/2020    TBILI 0.36 11/13/2020    DBILI <0.2 07/17/2020          Review of Radiology Studies:    Pulmonary Function Test Review:  04/28/2020 (care everywhere): noraml spito, normal volumes, mildly reduced DLCO    Radiology Review:    CT chest (11/09/2020):  FINDINGS:  Lines and Tubes: PICC ends in the left brachiocephalic vein    Thyroid and thoracic inlet: Normal.  Lymph nodes: Lymph nodes have increased in size. 8 mm AP window lymph node measured 4 mm on the prior study. 8 mm lymph node adjacent  to the superior vena cava measured 6 mm on the prior study. Other lymph nodes are unchanged.  Cardiovascular: Atherosclerotic disease is relatively mild for the patient's age.  Pericardium: Small pericardial effusion  Esophagus: Normal.    Lung: Comparison to the outside study is limited due to poor spatial resolution on that study given 5 mm slice thickness versus 4.193 mm slice thickness. New thick walled cavitary 2.6 cm right apical nodule on series 2, image 393 adjacent 12 mm thick walled cavitary nodule is new. Worsening tree-in-bud nodularity and bronchiectasis in the right upper lobe. Thick walled cavity in the posterior segment of the right upper lobe measuring 3.1 cm is unchanged in size but demonstrates increased wall thickness. Surrounding tree-in-bud nodularity is increased. Cavitary nodule in the right middle lobe is unchanged. Bronchiectasis may be increased but is again difficult to assess given differences in slice thickness. Worsening tree-in-bud nodularity throughout the right middle and right lower lobes. Worsening nodular consolidation in the medial aspect of the right middle lobe. New areas of nodularity throughout the left lung.  Pleura: Normal.    Abdomen: 1.2 cm hypoattenuating nodule in the liver. This is unchanged    Bone and soft tissue: No acute abnormality.    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    CT chest 04/13/2020:   CONCLUSION:   1. 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   2. Cavitary lesion in the periphery of the right lung has increased in size measuring 2.7 cm, although may have a slightly less thickened and nodular wall.   3. Diffuse bronchiectasis most pronounced in the right upper lobe.     Microbiology  BAL 04/28/2020 (from Dr. Cecile Hearing): AFB smear 3+, culture MAC (drug  susceptibly testing sent to [facility]- pending)      ASSESSMENT AND PLAN:  Tiffany Chen is a 75 year old female with history of bronchiectasis, cavitary MAC lung infection, ruptured diverticulitis s/p colostomy in 2009, DM, ICH s/p embolization. She is here to follow up on bronchiectasis and MAC lung infection.     # Bronchiectasis/ Cavitary MAC lung infection: currently stable with mild symptoms. She reports improved condition after 3 months of IV Amikacin. She was advised to start MAC antibiotic therapy due to the severity of her lesions and symptoms. She started on  IV antibiotic Amikacin home infusion for 3 months (08/14/2020 to 11/24/2020), azithromycin 250 mg daily, ethambutol 800 mg daily and rifampin 600 mg daily.  She was started on inhaled Arikayce on 11/23/2020. She denies mayor side effects and seems tolerating well antibiotic therapy. PFT from 04/28/2020 ( outside) wnl, mildly reduced DLCO, CT Chest from 04/13/2020 with micronodular and tree in bud opacity consistent with MAC infection. Cavitary lesion to RUL about 2.7 cm. CT chest from 11/09/2020 with worsening of nontuberculous mycobacterial disease infection involving the right lung (new thick walled cavitary 2.6 cm right apical nodule on series 2, image 393 adjacent 12 mm thick walled cavitary nodule is new. Worsening tree-in-bud nodularity and bronchiectasis in the right upper lobe).Sputum cultures from 11/2020 and 10/2020 with preliminary positive result. I have discussed air way clearance and MAC antibiotic therapy. I have recommended her to continue with air way clearance once a day with saline nebulizer solution followed by acapella device ( device given during the visit). Patient was instructed to use albuterol inhaler 2 puffs before air way clearance. I have recommended her continue with Arikayce daily, azithromycin 250 mg daily, ethambutol 800 mg daily and rifampin 600 mg daily. I have reminded her to continue with lab work  every month, sputum cultures for AFB and respiratory cultures x 3 per month, EKG and Audiogram every 3-4 months  and to continue with ishihara discrimination color chart. Patient was advised to monitor for respiratory symptoms and call back if any changes. As per Dr. Gilmer Mor, would consider right upper lobe lobectomy. ED precautions given.     # Immunizations: up to date.     "I personally spend a total of 60 Minutes in face to face and non face to face activities related to patient's visit today, excluding and separately reportable services/procedures."    Follow up with [name] in 2 months.     Zella Richer NP-BC  Pulmonary Medicine

## 2020-12-05 ENCOUNTER — Telehealth (HOSPITAL_BASED_OUTPATIENT_CLINIC_OR_DEPARTMENT_OTHER): Payer: Self-pay | Admitting: Critical Care Medicine

## 2020-12-05 NOTE — Telephone Encounter (Signed)
Josh from [facility] is calling in requesting new Home Health Orders be placed and sent by Dr Elmaraachli in accordance with plan of action. Please review and assist by placing and sending home health orders to contact information below. Thank you.     [facility]  PH# [phone]  FAX# [phone]

## 2020-12-05 NOTE — Telephone Encounter (Signed)
Message routed to RN Candy for review

## 2020-12-06 ENCOUNTER — Other Ambulatory Visit: Payer: Self-pay

## 2020-12-06 NOTE — Telephone Encounter (Signed)
MACRN: faxed paperwork -resolved.

## 2020-12-07 ENCOUNTER — Ambulatory Visit: Payer: Medicare Other | Attending: Critical Care Medicine | Admitting: Nurse Practitioner

## 2020-12-07 ENCOUNTER — Encounter (HOSPITAL_BASED_OUTPATIENT_CLINIC_OR_DEPARTMENT_OTHER): Payer: Self-pay | Admitting: Nurse Practitioner

## 2020-12-07 VITALS — BP 134/66 | HR 88 | Temp 97.7°F | Resp 18 | Ht 62.5 in | Wt 128.0 lb

## 2020-12-07 DIAGNOSIS — A319 Mycobacterial infection, unspecified: Secondary | ICD-10-CM | POA: Insufficient documentation

## 2020-12-07 DIAGNOSIS — J479 Bronchiectasis, uncomplicated: Secondary | ICD-10-CM | POA: Insufficient documentation

## 2020-12-07 DIAGNOSIS — J984 Other disorders of lung: Secondary | ICD-10-CM | POA: Insufficient documentation

## 2020-12-07 HISTORY — PX: BRONCHOSCOPY: SHX175

## 2020-12-07 NOTE — Patient Instructions (Addendum)
-   Please continue with air way clearance 1 albuterol solution followed by saline solution and Aerobika/ acapella device).  -Please continue with MAC antibiotic therapy as instructed.   - Please submit sputum samples for AFB and respiratory culture x 3 every 4-6 weeks.  - Continue with lab work (CBC, CMP) every month (due on 12/13/2020 ).  - Continue with Audiogram every 3-4 months ( due on  02/2021)  - EKG every 3-4 months (due on 12/2020 )  - Perform Ishihara discrimination color chart 3 times a week minimum.  - Monitor for acute respiratory symptoms and provide with a call back if worsen.   - Follow up with [name] in 2 months

## 2020-12-12 NOTE — Progress Notes (Signed)
REFERRING PHYSICIAN   Referring Provider:  Katheren Shams, MD  [address]  [place],  [place] [address]    Primary Care Provider:  Katheren Shams  [address] [place] [address]    PATIENT IDENTIFICATION   Tiffany Chen is a 75 year old female with cavitary lesions seen in consultation at the request of [name].    CHIEF COMPLAINT / HISTORY OF PRESENT ILLNESS   Tiffany Chen is a 75 year old female with a history of bronchiectasis, cavitary MAC lung infection, ruptured diverticulitis S/P colostomy in 2009, diabetes, ICH S/P embolization. She has a history of MAC lung infection treated multiple times with azithromycin 561m, Rifampin 6037m, ethambutol 160060mhree times weekly: 2018- 01/19/2018, 05/25/2018- 10/22/2019 and 12/22/2019- 02/16/2020.     As per last note from Dr. [name]"In late 2021, CT scan showed enlarging cavity. December 2021 a bronch was performed but no MAC growth. Her doctor restarted azithro/Rifampin/ethmabutol based on imaging.     Feb 2022 she saw [name] stopped the antibiotics based on the lack of microbiologic data. Performed repeat bronch 04/28/2020 which has smear AFB 3+ and culture MAC (drug susc pending).     During her visit from 07/25/2019 with Dr. ElmDebara Pickette was strongly recommended to start new round of MAC antibiotic therapy due to the severity of the lung cavitation and severity of her symptoms. The regimen recommended was IV antibiotic (Amikacin) home infusion for 3 months, azithromycin 250 mg daily, ethambutol 800 mg daily and rifampin 600 mg daily.     She started on MAC antibiotic therapy on 08/14/2020 with tentative plan to stop IV amikacin on 11/24/2020 and replaced with arikayce. Her IV was removed on 11/24/2020 and also discussed the possibility of right upper lobe lobectomy.  She has been tolerating well MAC antibiotics.    11/23/2020 she started on Arykace every day. She reports mild sore  throat with Arykace but was able to tolerate with hot tea.      While on therapy she was advised to have lab work twice a week while on IV antibiotic and then after every month, continue with Audiogram every 2 weeks, submit sputum samples x 3 every month, EKG every 3-4 months and continue with ishihara discrimination color test. "      HPI from 12/07/2020 visit with Pulmonary NP:  "Today, Tiffany Chen feeling well not but a her baseline.She states feeling better after 3 months of IV amikacin. She states having more energy. She reports improved cough and now intermittent. She reports not able to produce phlegm.  In general she has noticed improved cough ever since started on MAC antibiotic. She has been taking MAC antibiotics religiously. She has been doing air way clearance once a day with saline solution twice a day.She denies wheezing or dyspnea. She denies hemoptysis, fevers, chills, nausea, vomiting or night sweats.  She is currently capable to walk more than 1 mile without feeling winded. She denies feeling winded when going uphill or on stairs. Sometimes she has to stop an rest. She denies chest congestion, orthopnea, PND lower extremity edema. She reports history of palpitations. She denies allergic rhinitis, sinus problems or other URI. She has history of GERD controlled with diet and PPI. She have had influenza vaccine on fall 2022 and competed COVID vaccines."    She confirms the above to me today. Feeling better subjectively (less fatigue, less cough) but  new cavity on imaging. She has had bronchoscopy before.   TOBACCO HISTORY     Social History     Tobacco Use   Smoking Status Never   Smokeless Tobacco Never        PAST HISTORY     Probelms   Patient Active Problem List    Diagnosis Date Noted   . Gastroesophageal reflux disease, unspecified whether esophagitis present 10/12/2020     Added automatically from request for surgery [id number]     . Hoarse voice quality 10/12/2020     Added automatically  from request for surgery [id number]     . Cavitary lesion of lung 07/24/2020   . Acquired tracheobronchomegaly with bronchiectasis (CMS-HCC) 05/22/2020   . 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          Past Medical History   Past Medical History:   Diagnosis Date   . Cavitary lesion of lung 07/24/2020   . DM (diabetes mellitus) (CMS-HCC)    . 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      Family History   No family history on file.      Social History   Social History     Tobacco Use   . Smoking status: Never   . Smokeless tobacco: Never   Substance Use Topics   . Alcohol use: Not on file     Social History     Social History Narrative   . Not on file        ALLERGIES     Allergies   Allergen Reactions   . Black [facility] Flavor Other       HOME MEDICATIONS     Prior to Admission medications    Medication Sig Start Date End Date Taking? Authorizing Provider   albuterol 108 (90 Base) MCG/ACT inhaler Inhale 2 puffs by mouth every 6 hours as needed for Wheezing. 08/11/20   Katheren Shams, MD   Amikacin Sulfate Liposome (ARIKAYCE) 590 MG/8.4ML SUSP 590 mg by Nebulization route daily. 11/20/20   Katheren Shams, MD   atorvastatin (LIPITOR) 10 MG tablet Take 10 mg by mouth daily.    Historical, Documentation   azithromycin (ZITHROMAX) 250 MG tablet Take 1 tablet (250 mg) by mouth daily. 07/24/20   Elmaraachli, Estill Batten, MD   ethambutol (MYAMBUTOL) 400 MG tablet Take 2 tablets (800 mg) by mouth daily. 07/24/20   Elmaraachli, Estill Batten, MD   fluoxetine (PROZAC) 20 MG tablet Take 20 mg by mouth daily.    Historical, Documentation   FLUOXETINE HCL PO 30 mg    Historical, Documentation   metFORMIN (GLUCOPHAGE) 500 mg tablet Take 1 tablet (500 mg) by mouth 2 times daily (with meals).    Historical, Documentation   omeprazole (PRILOSEC) 20 MG capsule Take 1 capsule (20 mg) by mouth every morning (before breakfast). 10/16/20    Bonnetta Barry, MD   Pediatric Multivitamins-Fl (MULTIVITAMINS/FL PO)     Historical, Documentation   ramipril (ALTACE) 2.5 MG capsule Take 2.5 mg by mouth daily.    Historical, Documentation   rifampin (RIFADIN) 300 MG capsule Take 2 capsules (600 mg) by mouth daily. 07/24/20   Elmaraachli, Estill Batten, MD   sodium chloride 7 % NEBU 4 mL by Nebulization route every 12 hours. 07/24/20   Katheren Shams, MD          REVIEW OF SYSTEMS   A 10+  review of systems was performed with pertinent positives and negatives noted above in the history of present illness.  Other systems were negative unless otherwise specified.    PHYSICAL EXAMINATION   BP 137/75 (BP Location: Left arm, BP Patient Position: Sitting, BP cuff size: Regular)   Pulse 72   Temp 97.7 F (36.5 C) (Temporal)   Resp 18   Ht 5' 2.5" (1.588 m)   SpO2 96%   BMI 23.04 kg/m    Oxygen Therapy  SpO2: 96 %        ECOG performance status: 1 Restricted in physically strenuous activity but ambulatory and able to carry out work of a light or sedentary nature, e.g., light house work, office work  Karnofsky scale: 80% - Normal Activity with Effort: Some Symptoms of Disease  SMOKING STATUS:  Never smoker     GENERAL  female sitting comfortably in no acute distress.    HEENT No alopecia, normocephalic, atraumatic, sclera anicteric.  No conjunctivitis, no oral lesions or mucositis.  SKIN No lesions, no rashes.  LYMPH   No cervical, supraclavicular, axillary, or inguinal lymphadenopathy.  CARDIAC RRR, no murmurs, gallops or rubs.   LUNGS Clear to auscultation bilaterally.  No wheezes, rubs, or rales.  ABDOMEN  Normoactive bowel sounds.  Soft, nontender, nondistended. Colostomy in place  EXTREMITIES No edema, clubbing, or cyanosis.  NEURO Alert and oriented x 3. Strength and sensation intact and symmetric in upper and lower extremities.       LABS/STUDIES   All studies were reviewed personally.    CBC:  BMP:   Lab Results   Component Value Date    WBC 5.6 11/13/2020    WBC  5.8 11/10/2020    WBC 6.4 11/06/2020    HGB 10.3 (L) 11/13/2020    HGB 10.9 (L) 11/10/2020    HGB 10.4 (L) 11/06/2020    HCT 33.4 (L) 11/13/2020    HCT 34.8 11/10/2020    HCT 33.3 (L) 11/06/2020    PLT 290 11/13/2020    PLT 289 11/10/2020    PLT 302 11/06/2020    SEG 66 11/13/2020    SEG 65 11/10/2020    SEG 66 11/06/2020    LYMPHS 23 11/13/2020    LYMPHS 22 11/10/2020    LYMPHS 24 11/06/2020    MONOS 8 11/13/2020    MONOS 8 11/10/2020    MONOS 7 11/06/2020     Lab Results   Component Value Date    NA 139 11/13/2020    NA 140 11/10/2020    NA 136 11/06/2020    K 4.6 11/13/2020    K 5.0 11/10/2020    K 4.9 11/06/2020    CL 103 11/13/2020    CL 102 11/10/2020    CL 98 11/06/2020    BICARB 28 11/13/2020    BICARB 25 11/10/2020    BICARB 26 11/06/2020    BUN 18 11/13/2020    BUN 15 11/10/2020    BUN 13 11/06/2020    CREAT 0.52 11/13/2020    CREAT 0.51 11/10/2020    CREAT 0.49 (L) 11/06/2020    GLU 134 (H) 11/13/2020    GLU 126 (H) 11/10/2020    GLU 99 11/06/2020    [place] 9.7 11/13/2020     9.4 11/10/2020     9.6 11/06/2020        Coags:  LFTs:   No results found for: PTT, INR, PT   Lab Results   Component Value Date  ALK 59 11/13/2020    ALK 59 11/10/2020    ALK 61 11/06/2020    AST 20 11/13/2020    AST 20 11/10/2020    AST 36 (H) 11/06/2020    ALT 9 11/13/2020    ALT 13 11/10/2020    ALT 16 11/06/2020    TBILI 0.36 11/13/2020    TBILI 0.32 11/10/2020    TBILI 0.30 11/06/2020    DBILI <0.2 07/17/2020    TP 7.0 11/13/2020    TP 7.2 11/10/2020    TP 7.3 11/06/2020    ALB 4.3 11/13/2020    ALB 4.5 11/10/2020    ALB 4.4 11/06/2020        ABG   No results found for: ARTPH, ARTPCO2, ARTPO2, ARTHC03, ARTBE, ARTO2SAT        Microbiology:    No results found for: BLOODCULT, FUNGALBC, AFBBACTCULT, URINECULTURE, QTFERON, QUANTIFERON, CRYPTOAG, CRYPTOCAGCSF, COCCICF, COCCICFCSF, COCCIIMMDIIF, COCCIIMMCSF, HISTOAGUR, CMVDNAQT, CMVDNAQTCSF    No results found for this or any previous visit.    Imaging:   CT CHEST  11/09/2020    IMPRESSION:  Significant worsening of nontuberculous mycobacterial disease infection involving the right lung greater than the left lung as above.        CT chest 04/13/2020:   CONCLUSION:   1. 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   2. Cavitary lesion in the periphery of the right lung has increased in size measuring 2.7 cm, although may have a slightly less thickened and nodular wall.   3. Diffuse bronchiectasis most pronounced in the right upper lobe.     Microbiology  BAL 04/28/2020 (from Dr. Cecile Hearing): AFB smear 3+, culture MAC (drug susceptibly testing sent to [facility]- pending)    Sputum - AFB  08/01/20: AFB smear negative, Cx MAC  08/03/20: AFB smear 1+, Cx MAC  08/07/20: AFB smear negative, Cx MAC    Sputum - Routine   08/01/20: NL Resp Flora   08/03/20: NL Resp Flora  08/07/20: NL Resp Flora    EKG   08/23/2020: sinus rhythm with PAC     Pulmonary Function Test:  04/28/2020 (care everywhere): noraml spito, normal volumes, mildly reduced DLCO    IMPRESSION   Journiee Feldkamp is a 75 year old female with a history of cavitary MAC lung infection, IgG deficiency and bronchiectasis on daily azithro/rifampin/ethambutol s/p 3 months of IV amikacin stopped 11/8 now on inhaled arikayce presents for evaluation for bronchoscopy in the setting of worsening imaging (new cavitary lesion) but improving symptoms on this course of therapy. Risks/benefits/alternatives of bronchoscopy w/ BAL discussed and the patient elected to proceed. All questions answered. She already has follow-up scheduled with [name] mid January.       PLAN     - bronchoscopy with BAL     I personally reviewed the radiographic studies and evaluated the patient.   I discussed the plan above with the patient.  Prarthana Parlin expressed an understanding of this discussion and recommendations above.  All questions were answered to patient's  satisfaction.  The patient has my contact information, and understands to call with any additional questions or concerns.      Adele Schilder, MD  Fellow, Pulmonary Disease and Critical Care   12/12/2020    CC:  Katheren Shams, MD  [address]  [place],  [place] [address]  Patient Care Team:  Katheren Shams, MD as PCP -  General (Critical Care)

## 2020-12-13 ENCOUNTER — Ambulatory Visit: Payer: Medicare Other | Attending: Critical Care Medicine | Admitting: Pulmonary Medicine

## 2020-12-13 DIAGNOSIS — A310 Pulmonary mycobacterial infection: Secondary | ICD-10-CM | POA: Insufficient documentation

## 2020-12-13 NOTE — Patient Instructions (Signed)
Thank you for coming to see [place] in Interventional Pulmonology Clinic.     Please note the following is your plan:    If bronchoscopy/pleuroscopy is recommended:  - Nothing to eat or drink after midnight the night before bronchoscopy/pleuroscopy.  - COVID 19 testing: Call [phone], option 2 to schedule an appt for testing 48-72 hrs prior to surgery schedule    Diagnoses and all orders for this visit:    Nontuberculous mycobacterial disease of lung (CMS-HCC)  -     Consult/Referral to Pulmonary and Sleep  -     Case Request: BRONCHOSCOPY, WITH BRONCHOALVEOLAR LAVAGE  -     Pulmonary Only - Follow Up in This Department        No follow-ups on file.      --------------------------------------------------------------------------  Your Team   Clinic Schedule: Monday 8AM-12:30PM    PROVIDERS:   Wilber Oliphant, MD PhD  Brayton Layman, MD  Denyce Robert, MD  Saunders Revel, NP    CLINICAL ADMINISTRATIVE ASSISTANTS:  Herminio Heads (M-F 8am-430pm)  Adelfa Koh (M-F 8am-430pm)  Mirian Capuchin (M-F 8am-430pm)    Phone: [phone]  Fax: [phone]    Please contact them for assistance with scheduling/rescheduling appointments with our providers and sharing of medical records    Please use MyChart to communicate any questions or needs for prescription refills.                                                                                                                                                  If you have a medical emergency or life threatening situaton, please call 911 or go to the nearest Emergency Room.     PRESCRIPTION REFILLS:  In an effort to decrease medication refill delays due to weekends, please note that all prescription refill requests should be completed prior to Thursdays at noon. Any requests made after this time may not be filled until the following Monday. You may also have your pharmacy fax a request to the fax number above.     AFTER-HOURS CARE:   If you develop a fever of 100.44F or greater  for an hour, or at the first episode of a fever of 101F or greater, please call PATRICIA/GEORGINA, you may have to go to the ER; please come to the ER at [facility] if possible.  If the fever happens after hours (On the weekends and/or Monday through Friday before 8:00am or after 4:30pm) please call the [facility] and ask to speak with the [facility] Pulmonology Fellow.      For any urgent/emergent symptoms that happen after hours (Weekends & Monday - Friday before 8:00am and after 4:30pm) please call  [place] and ask for the [facility] Pulmonology Fellow.    [facility] Phone List for Patients   Clinic Hours of Operation:  Monday-Friday 8:00-4:30p.m. (closed holidays and weekends)     Information Desk: [phone]   ** General information: directions, telephone numbers, or available services     Radiology (CT) Scheduling: [phone]     PET/CT or PET scheduler: [phone]    Pulmonary Function and Exercise Laboratories:   [facility]:  [place]:  [phone]    [place]: phone [phone]/ fax [phone]  Open M-F 9AM-5PM     Medical Records: [phone] / Fax [phone]   [email or web address]    Requests for Radiology Records / X-Ray Images  As of Aug. 28, 2018, all imaging results -- including computed tomography (CT), magnetic resonance imaging (MRI), ultrasound, and others -- are released to patients' [facility] account automatically. Normal results are released four days after the result is available, and abnormal results are released seven days after the result is available.    For previous imaging records, or if you do not have a [facility] account, please use release of information authorization forms, or contact Radiology/Imaging Services at [phone]. Radiology may be able to release images by email or answer questions about release of X-rays and other images.     [place] (for oxygen  supplies): [phone]

## 2020-12-14 ENCOUNTER — Telehealth (HOSPITAL_BASED_OUTPATIENT_CLINIC_OR_DEPARTMENT_OTHER): Payer: Self-pay | Admitting: Pulmonary Medicine

## 2020-12-14 NOTE — Telephone Encounter (Signed)
Called and LVM to schedule bronchoscopy.

## 2020-12-16 ENCOUNTER — Other Ambulatory Visit (HOSPITAL_BASED_OUTPATIENT_CLINIC_OR_DEPARTMENT_OTHER): Payer: Self-pay | Admitting: Pulmonary Medicine

## 2020-12-16 LAB — EMMI , ANESTHESIA (ADULT): EMMI Video Order Number: 12865064864

## 2020-12-16 LAB — EMMI , BRONCHOSCOPY: EMMI Video Order Number: 13719517933

## 2020-12-16 LAB — EMMI,  PAIN MANAGEMENT: ACUTE IN HOSPITAL: EMMI Video Order Number: 19859208654

## 2020-12-18 ENCOUNTER — Encounter (INDEPENDENT_AMBULATORY_CARE_PROVIDER_SITE_OTHER): Payer: Self-pay | Admitting: Allergy

## 2020-12-18 ENCOUNTER — Ambulatory Visit (INDEPENDENT_AMBULATORY_CARE_PROVIDER_SITE_OTHER): Payer: Medicare Other | Admitting: Allergy

## 2020-12-18 VITALS — BP 113/67 | HR 75 | Temp 98.4°F | Ht 62.5 in | Wt 128.0 lb

## 2020-12-18 DIAGNOSIS — D809 Immunodeficiency with predominantly antibody defects, unspecified: Secondary | ICD-10-CM

## 2020-12-18 DIAGNOSIS — A310 Pulmonary mycobacterial infection: Secondary | ICD-10-CM

## 2020-12-18 DIAGNOSIS — J479 Bronchiectasis, uncomplicated: Secondary | ICD-10-CM

## 2020-12-18 NOTE — Patient Instructions (Signed)
After visit summary:    [facility]   [facility], 4th Floor and 9th Floor  Hours: [phone], 7:30 a.m. - 5:30 p.m.   [phone]   This location is a hospital-licensed infusion center.     Please ask about copay costs and out of pocket costs     Follow-up in 3 months

## 2020-12-18 NOTE — Progress Notes (Unsigned)
Allergy/Immunology Follow-up    Date: 12/18/2020  Referring MD: Courtney Heys  Reason for consultation: "IgG deficiency (results in care everywhere), bronchiectasis, cavitary MAC lung infection. Might Ig replacement be beneficial"  ______________________________________________________________________    HPI: Tiffany Chen is a 75 year old female here for follow-up for immunodeficiency. Patient was last seen on 09/07/2020 at which time recommendations were for additional immune work-up including repeat quantitative immunoglobulins as well as vaccine titers (tetanus and pneumococcal). Pneumovax-23 administered 09/22/20. Only 14/23 protective at recheck. Discussion on IVIG replacement therapy today, and patient has informed Tuesday or Wed in 2 weeks at [facility] would be ideal.     Initial consultation: Prior medical records have been reviewed carefully prior to this consultation and salient details have been summarized below. Patient has a history of bronchiectasis, cavitary lung infection (MAC). There is no history of chronic sinusitis. She has had multiple rounds of treatment for MAC, with most recent antibiotic regimen including amikacin, azithromycin, ethambutol and rifampin.     Environmental and social history:  Tobacco smoke exposure:   Social History     Tobacco Use   Smoking Status Never   Smokeless Tobacco Never         Past Medical and Surgical History:  Past Medical History:   Diagnosis Date   . Cavitary lesion of lung 07/24/2020   . DM (diabetes mellitus) (CMS-HCC)    . 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    Allergies:  Allergies   Allergen Reactions   . Black [employer]  Other       Medications:    Current Outpatient Medications:   .  albuterol 108 (90 Base) MCG/ACT inhaler, Inhale 2 puffs by mouth every 6 hours as needed for Wheezing., Disp: 3 each, Rfl: 3  .  Amikacin Sulfate Liposome (ARIKAYCE) 590 MG/8.4ML SUSP, 590 mg by  Nebulization route daily., Disp: 252 mL, Rfl: 11  .  atorvastatin (LIPITOR) 10 MG tablet, Take 10 mg by mouth daily., Disp: , Rfl:   .  azithromycin (ZITHROMAX) 250 MG tablet, Take 1 tablet (250 mg) by mouth daily., Disp: 30 tablet, Rfl: 11  .  ethambutol (MYAMBUTOL) 400 MG tablet, Take 2 tablets (800 mg) by mouth daily., Disp: 60 tablet, Rfl: 11  .  fluoxetine (PROZAC) 20 MG tablet, Take 20 mg by mouth daily., Disp: , Rfl:   .  FLUOXETINE HCL PO, 30 mg, Disp: , Rfl:   .  metFORMIN (GLUCOPHAGE) 500 mg tablet, Take 1 tablet (500 mg) by mouth 2 times daily (with meals)., Disp: , Rfl:   .  omeprazole (PRILOSEC) 20 MG capsule, Take 1 capsule (20 mg) by mouth every morning (before breakfast)., Disp: 90 capsule, Rfl: 1  .  Pediatric Multivitamins-Fl (MULTIVITAMINS/FL PO), , Disp: , Rfl:   .  ramipril (ALTACE) 2.5 MG capsule, Take 2.5 mg by mouth daily., Disp: , Rfl:   .  rifampin (RIFADIN) 300 MG capsule, Take 2 capsules (600 mg) by mouth daily., Disp: 60 capsule, Rfl: 11  .  sodium chloride 7 % NEBU, 4 mL by Nebulization route every 12 hours., Disp: 240 mL, Rfl: 11    FH: No FH of atopic disease: asthma, allergic rhinitis, eczema, food allergies    Diagnostic studies:  IgA 278  IgG 819  IgM 41        Prior diagnostic studies:   CMP normal liver, total protein  CBC : no lymphopenia  or thrombocytopenia                                Total time breakdown:  Pre-Visit [Reviewing last visit, Care Everywhere, recent labs, images]/Post-Visit [Note completion, placing of orders, coordination of care]/Intra-Visit [Updating relevant history, creating a treatment plan, medical discussion with patient] : 40 minutes    Impression:  75 year old here for evaluation of:    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 Bronchiectasis   3. 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 Bronchiectasis   3. MAC infection  Patient presents for follow-up on immune  testing which have confirmed immunodeficiency with antibody defects. Given her history of persistent MAC infection, bronchiectasis, low IgM, low IgG subclass, and poor vaccine titers, advise IgG replacement therapy. She has elected to undergo IVIG. IgG replacement infusion therapy is medically indicated given the above clinical profile and concerns for evolution to CVID given low IgM and low IgG subclass 2/3.     We have discussed both intravenous and subcutaneous immunoglobulin replacement therapy with the patient and she would like to proceed with IVIG at [facility] - Tuesd/Wed best days. Risks of immunoglobulin replacement therapy were carefully reviewed with the patient as there are several black box warnings on the products. Although the risks general occur more in high dose immunoglobulin replacement regimens such as in neurological disorders, we have seen severe adverse effects at PID dosing and advise all patients that these risks are real, severe and capable of presenting at any dosing scheme. We have reviewed anaphylaxis, shock, death, pulmonary embolism, DVT, hemolytic disorders, acute renal failure (we have seen acute renal dysfunction but not failure in non-sucrose formulations), aseptic meningitis, TRALI/pulmonary edema, swelling, weight gain and local infusion site reactions/pain.   -Will start at 573m/kg  -Recommendations are for immunoglobulin replacement therapy  -Risks, alternatives, benefits discussed as above  -Risk of severe progression of lung disease and life-threatening infections if immunoglobulin replacement is not approved and has been clearly documented above in terms of reasons to support prescription; failure to approve could lead to life-threatening pulmonary consequences     RTC 3 months     The plan of care was discussed with the patient who expressed agreement and understanding. Questions were answered prior to conclusion of the visit. Patient seen and examined  with Dr XHarmon PierWang--Concur with dx and management as outlined in the consultative report. I have seen and examined the patient with Dr. XMarcelina Moreland agree with the history and physical exam.       ADurel Salts MD  Clinical Associate Professor, [facility]

## 2020-12-18 NOTE — Progress Notes (Unsigned)
Allergy/Immunology Follow-up Visit      Date: 12/18/2020    75 year old here for follow up of immunodeficiency evaluation in the setting of bronchiectasis and MAC infection.    At last visit in Sept 2022, she was referred to [place] by Dr. 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 Total IgG has been in normal range, and there is no history of frequent recurrent sinusitis although lung infection history is noted. Pneumococcal titers were low and she underwent Pneumovax vaccination in Sept 16, 2022    Interval history:  - significant worsening of MAC infection based on CT chest in Nov 2022  - has been on daily azithro/rifampin/ethambutol s/p 3 months of IV amikacin stopped 11/8 now on inhaled amikacin by Pulm  - respiratory symptoms have actually been stable, night sweats have improved, she is having slightly higher exercise tolerance than she did in the past    Review of systems:  14 point review of systems negative except per HPI    Physical Examination:   12/18/20  1423   BP: 113/67   Pulse: 75   Temp: 98.4 F (36.9 C)   SpO2: 98%     Gen: No apparent distress, well-developed, well-nourished  HEENT: NC/AT, PERRL, EOMI, sclera anicteric, MMM, oropharynx clear  Neck: no lymphadenopathy  Cardiac: RRR, no murmurs  Pulm: clear to auscultation bilaterally, no crackles or wheezes  Abdomen: Soft, non-tender, non-distended, normoactive bowel sounds  Skin: No rashes or ulcers  Extremities: Warm and well perfused, no peripheral edema  Neuro: Alert and oriented x person, place, time. Grossly non-focal. Moves all extremities  Psych: Appropriate mood. Linear thought process.      Labs and diagnostics:    Component Ref Range & Units 1 mo ago   (11/13/20) 1 mo ago   (11/10/20) 1 mo ago   (11/06/20) 1 mo ago   (11/03/20) 1 mo ago   (10/30/20) 1 mo ago   (10/27/20) 1 mo ago   (10/23/20)   WBC 4.0 - 10.0 1000/mm3 5.6  5.8  6.4  5.4  6.1   5.5  5.9    RBC 3.90 - 5.20 mill/mm3 3.77Low  3.96  3.72Low  3.89Low  4.01  3.66Low  3.65Low    Hgb 11.2 - 15.7 gm/dL 10.3Low  10.9Low  10.4Low  10.4Low  11.0Low  10.2Low  10.1Low    Hct 34.0 - 45.0 % 33.4Low  34.8  33.3Low  34.6  35.6  31.9Low  32.6Low    MCV 79.0 - 95.0 um3 88.6  87.9  89.5  88.9  88.8  87.2  89.3    MCH 26.0 - 32.0 pgm 27.3  27.5  28.0  26.7  27.4  27.9  27.7    MCHC 32.0 - 36.0 g/dL 30.8Low  31.3Low  31.2Low  30.1Low  30.9Low  32.0  31.0Low    RDW 12.0 - 14.0 % 13.4  13.3  13.5  13.6  13.6  13.5  14.0    MPV 9.4 - 12.4 fL 10.2  10.2  10.2  10.4  10.4  10.3  10.2    Plt Count 140 - 370 1000/mm3 290  289  302  269  278  274  282    Segs % 66  65  66  63  66  63  68    Lymphocytes % _0 23  21    Monocytes % _0 Eosinophils % _1 Basophils % _2 ANC-Automated 1.6 - 7.0 1000/mm3 3.7  3.7  4.2  3.4  4.0  3.5  4.0    Abs Lymphs 0.8 - 3.1 1000/mm3 1.3  1.3  1.5  1.2  1.4  1.3  1.2    Abs Monos 0.2 - 0.8 1000/mm3 0.4  0.4  0.4  0.4  0.5  0.5  0.4    Abs Eosinophils 0.0 - 0.5 1000/mm3 0.2  0.3  0.2  0.3  0.2  0.2  0.2    Abs Basophils <0.2 1000/mm3 0.1  0.1  0.1         Component Ref Range & Units 1 mo ago 3 mo ago   Serotype 1 IgG ug/mL 0.66  0.33    Pneumococcal Serotype 2 IgG ug/mL 6.09  0.52    Serotype 3 IgG ug/mL 5.73  0.50    Serotype 4* IgG ug/mL 0.46  0.09    Serotype 5 IgG ug/mL 1.19  0.87    Serotype 6B* IgG ug/mL 0.30  0.21    Serotype 82F IgG ug/mL 1.50  0.73    Serotype 8 IgG ug/mL 1.90  0.17    Serotype 9N IgG ug/mL 0.37  0.10    Serotype 9V* IgG ug/mL 4.64  2.28    Pneumococcal Serotype 10A IgG ug/mL 0.83  0.61    Pneumococcal Serotype 11A IgG ug/mL 2.87  2.93    Serotype 74F IgG ug/mL 0.15  0.09    Serotype 14* IgG ug/mL 6.66  2.66    Pneumococcal Serotype 15B IgG ug/mL 5.01  2.61    Pneumococcal Serotype 182F IgG ug/mL 1.35  0.00    Serotyp 18C* IgG ug/mL 1.49  0.56     Pneumococcal Serotype 19A IgG ug/mL 2.79  2.98    Serotyp 74F* IgG ug/mL 1.23  0.86    Pneumococcal Serotype 20 IgG ug/mL 1.88  1.15    Pneumococcal Serotype 5F IgG ug/mL >29.84  13.11    Serotyp 85F* IgG ug/mL 0.13  0.08    Pneumococcal Serotype 11F IgG ug/mL 6.03  0.98      Weak responder: 13 out of 23 protective    Component Ref Range & Units 3 mo ago    Tetanus AB, IgG [IU]/mL 6.3      Component Ref Range & Units 3 mo ago    CD3+ T-Cell % 59 - 85 % 80    CD3+ T-Cell Abs 500 - 1,800 cells/uL 1,229    CD4+ T-Cell % 29 - 61 % 58    CD4+ T-Cell Abs 250 - 1,200 cells/uL 899    CD8+ T-Cell % 11 - 38 % 22    CD8+ T-Cell Abs 100 - 800 cells/uL 332    CD4:CD8 Ratio 0.70 - 4.00 2.71    CD19 B-Cell % 6 - 23 % 10    CD19 B-Cell Abs 50 - 350 cells/uL 162    NK Cells % 5 - 31 % 8    NK Cell Abs 50 - 500 cell/uL 131        Component Ref Range & Units 3 mo ago 4 mo ago   IGG 700 - 1,600 mg/dL  819  920 CM      Component Ref Range & Units 3 mo ago 4 mo ago   IGM 40 - 230 mg/dL 41  53 CM      Component Ref Range & Units 3 mo ago 4 mo ago   IGA 70 - 400 mg/dL 278  318 CM       Ref Range & Units 10 mo ago   IGG 1 248 - 810 mg/dL 500    IGG 2 130 - 555 mg/dL 112Low    IGG 3 15 - 102 mg/dL 3Low    IGG 4 2 - 96 mg/dL 21    IGG 586 - 1602 mg/dL 791          EXAM DESCRIPTION:  CT CHEST W/O CONTRAST    CLINICAL HISTORY:  Nontuberculous mycobacterial disease    TECHNIQUE:  A volumetric CT acquisition of the chest was obtained from the thoracic inlet to the upper abdomen, without intravenous contrast. This CT scan was performed with one or more of the following dose optimization techniques: iterative reconstruction, automatic exposure control, and/or manual adjustment of mAs and kVp according to the patient's size.    Up-to-date CT equipment and radiation dose reduction techniques were employed. CTDIvol: 6.2 mGy. DLP: 222 mGy-cm.      COMPARISON:  Outside study from 04/13/2020    FINDINGS:  Lines and Tubes: PICC ends in the  left brachiocephalic vein    Thyroid and thoracic inlet: Normal.  Lymph nodes: Lymph nodes have increased in size. 8 mm AP window lymph node measured 4 mm on the prior study. 8 mm lymph node adjacent to the superior vena cava measured 6 mm on the prior study. Other lymph nodes are unchanged.  Cardiovascular: Atherosclerotic disease is relatively mild for the patient's age.  Pericardium: Small pericardial effusion  Esophagus: Normal.    Lung: Comparison to the outside study is limited due to poor spatial resolution on that study given 5 mm slice thickness versus 7.628 mm slice thickness. New thick walled cavitary 2.6 cm right apical nodule on series 2, image 393 adjacent 12 mm thick walled cavitary nodule is new. Worsening tree-in-bud nodularity and bronchiectasis in the right upper lobe. Thick walled cavity in the posterior segment of the right upper lobe measuring 3.1 cm is unchanged in size but demonstrates increased wall thickness. Surrounding tree-in-bud nodularity is increased. Cavitary nodule in the right middle lobe is unchanged. Bronchiectasis may be increased but is again difficult to assess given differences in slice thickness. Worsening tree-in-bud nodularity throughout the right middle and right lower lobes. Worsening nodular consolidation in the medial aspect of the right middle lobe. New areas of nodularity throughout the left lung.  Pleura: Normal.    Abdomen: 1.2 cm hypoattenuating nodule in the liver. This is unchanged    Bone and soft tissue: No acute abnormality.    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      Impression:    75 year old here for follow up of immunodeficiency evaluation in the setting of bronchiectasis and MAC infection.     1. Bronchiectasis  2. Persistent MAC infection  3. Specific antibody deficiency      Recommendations:     Overall picture is concerning for specific antibody deficiency given inadequate response to Pneumovax and worsening MAC infection based on CT scan. Total IgG has been 800-900  but she has low IgG2 and IgG3. She would likely benefit from immunoglobulin replacement therapy to prevent future infections.    Would recommend starting IVIG 0.5 g/kg. Discussed risks and benefits of IVIG including anaphylaxis, DVT/PE, aseptic meningitis, renal toxicity, and hemolytic anemia. Patient is amenable to starting IVIG.    Discussed with Dr. Betsy Pries, MD PhD  PGY-4 in Allergy & Immunology  Pager: [phone]

## 2020-12-20 ENCOUNTER — Other Ambulatory Visit: Payer: Self-pay

## 2020-12-21 ENCOUNTER — Encounter (HOSPITAL_BASED_OUTPATIENT_CLINIC_OR_DEPARTMENT_OTHER): Payer: Self-pay | Admitting: Pulmonary Medicine

## 2020-12-21 LAB — EMMI , PATIENT SATISFACTION: HOSPITAL DISCHARGE EXPECTATIONS: EMMI Video Order Number: 15471487916

## 2020-12-23 ENCOUNTER — Encounter (INDEPENDENT_AMBULATORY_CARE_PROVIDER_SITE_OTHER): Payer: Self-pay | Admitting: Allergy

## 2020-12-23 DIAGNOSIS — D809 Immunodeficiency with predominantly antibody defects, unspecified: Secondary | ICD-10-CM | POA: Insufficient documentation

## 2020-12-25 ENCOUNTER — Encounter (HOSPITAL_BASED_OUTPATIENT_CLINIC_OR_DEPARTMENT_OTHER): Admission: RE | Disposition: A | Discharge status: Home Health | Payer: Self-pay | Attending: Pulmonary Medicine

## 2020-12-25 ENCOUNTER — Telehealth (HOSPITAL_BASED_OUTPATIENT_CLINIC_OR_DEPARTMENT_OTHER): Payer: Self-pay

## 2020-12-25 ENCOUNTER — Ambulatory Visit (HOSPITAL_BASED_OUTPATIENT_CLINIC_OR_DEPARTMENT_OTHER): Payer: Medicare Other

## 2020-12-25 ENCOUNTER — Ambulatory Visit
Admission: RE | Admit: 2020-12-25 | Discharge: 2020-12-25 | Disposition: A | Discharge status: Home / Self Care | Payer: Medicare Other | Attending: Pulmonary Medicine | Admitting: Pulmonary Medicine

## 2020-12-25 ENCOUNTER — Encounter (HOSPITAL_BASED_OUTPATIENT_CLINIC_OR_DEPARTMENT_OTHER): Payer: Self-pay | Admitting: Pulmonary Medicine

## 2020-12-25 DIAGNOSIS — Z9889 Other specified postprocedural states: Secondary | ICD-10-CM

## 2020-12-25 DIAGNOSIS — A310 Pulmonary mycobacterial infection: Secondary | ICD-10-CM

## 2020-12-25 DIAGNOSIS — Z79899 Other long term (current) drug therapy: Secondary | ICD-10-CM | POA: Insufficient documentation

## 2020-12-25 DIAGNOSIS — Z91018 Allergy to other foods: Secondary | ICD-10-CM | POA: Insufficient documentation

## 2020-12-25 DIAGNOSIS — J984 Other disorders of lung: Secondary | ICD-10-CM

## 2020-12-25 DIAGNOSIS — Z6822 Body mass index (BMI) 22.0-22.9, adult: Secondary | ICD-10-CM | POA: Insufficient documentation

## 2020-12-25 DIAGNOSIS — E119 Type 2 diabetes mellitus without complications: Secondary | ICD-10-CM | POA: Insufficient documentation

## 2020-12-25 DIAGNOSIS — Z7984 Long term (current) use of oral hypoglycemic drugs: Secondary | ICD-10-CM | POA: Insufficient documentation

## 2020-12-25 DIAGNOSIS — R918 Other nonspecific abnormal finding of lung field: Secondary | ICD-10-CM

## 2020-12-25 LAB — PNEUMONIA PATHOGENS NUCLEIC ACID DETECTION TEST
(NON-COVID-19) Coronavirus PCR, BAL: NOT DETECTED
Acinetobacter calcoaceticus-baumannii complex PCR, BAL: NOT DETECTED
Adenovirus PCR, BAL: NOT DETECTED
Chlamydophila (Chlamydia) pneumoniae PCR, BAL: NOT DETECTED
Enterobacter cloacae complex PCR, BAL: NOT DETECTED
Escherichia coli PCR, BAL: NOT DETECTED
Haemophilus influenzae PCR, BAL: NOT DETECTED
Human Metapneumovirus PCR, BAL: NOT DETECTED
Human Rhinovirus/Entrovirus PCR, BAL: NOT DETECTED
Influenza A Virus PCR, BAL: NOT DETECTED
Influenza B Virus PCR, BAL: NOT DETECTED
Klebsiella aerogenes PCR, BAL: NOT DETECTED
Klebsiella oxytoca PCR, BAL: NOT DETECTED
Klebsiella pneumoniae complex PCR, BAL: NOT DETECTED
Legionella pneumophila PCR, BAL: NOT DETECTED
Moraxella catarrhalis PCR, BAL: NOT DETECTED
Mycoplasma pneumoniae PCR, BAL: NOT DETECTED
Parainfluenza virus PCR, BAL: NOT DETECTED
Proteus species PCR, BAL: NOT DETECTED
Pseudomonas aeruginosa PCR, Bal: NOT DETECTED
Respiratory syncytial virus PCR, BAL: NOT DETECTED
Serratia marcescens PCR, BAL: NOT DETECTED
Staphylococcus aureus PCR, BAL: NOT DETECTED
Streptococcus agalactiae (Group B) PCR, BAL: NOT DETECTED
Streptococcus pneumoniae PCR, BAL: NOT DETECTED
Streptococcus pyogenes (Group A) PCR, BAL: NOT DETECTED

## 2020-12-25 LAB — COVID-19 BINAXNOW ANTIGEN (POCT): COVID-19 Antigen (POCT): NEGATIVE

## 2020-12-25 LAB — GLUCOSE (POCT)
Glucose (POCT): 145 mg/dL — ABNORMAL HIGH (ref 70–99)
Glucose (POCT): 131 mg/dL — ABNORMAL HIGH (ref 70–99)

## 2020-12-25 LAB — BODY FLUID CELL CT / DIFF
Eosinophils Fluid %: 1 %
Fluid Number of Total Nucleated Cell Counted: 100
Fluid WBC mm3: 3610 uL
Lymphocytes fluid %: 0 %
Macrophages fluid %: 2 %
Neutrophils Fluid %: 97 %

## 2020-12-25 SURGERY — BRONCHOSCOPY, WITH BRONCHOALVEOLAR LAVAGE
Anesthesia: Moderate Sedation - by non-anesthesia staff only

## 2020-12-25 MED ORDER — FERROUS SULFATE 325 (65 FE) MG OR TABS: 325.0000 mg | ORAL_TABLET | Freq: Every day | ORAL | Status: AC

## 2020-12-25 MED ORDER — LIDOCAINE HCL 2 % IJ SOLN WRAPPED RECORD
INTRAMUSCULAR | Status: AC
Start: 2020-12-25 — End: ?
  Filled 2020-12-25: qty 10

## 2020-12-25 MED ORDER — LIDOCAINE HCL 2 % IJ SOLN WRAPPED RECORD
INTRAMUSCULAR | Status: AC
Start: 2020-12-25 — End: ?
  Filled 2020-12-25: qty 20

## 2020-12-25 MED ORDER — MIDAZOLAM HCL 2 MG/2ML IJ SOLN
INTRAMUSCULAR | Status: DC | PRN
Start: 2020-12-25 — End: 2020-12-25
  Administered 2020-12-25: 2 mg via INTRAVENOUS
  Administered 2020-12-25 (×2): 1 mg via INTRAVENOUS

## 2020-12-25 MED ORDER — LIDOCAINE HCL 1 % IJ SOLN
INTRAMUSCULAR | Status: AC
Start: 2020-12-25 — End: ?
  Filled 2020-12-25: qty 10

## 2020-12-25 MED ORDER — MIDAZOLAM HCL 2 MG/2ML IJ SOLN
INTRAMUSCULAR | Status: AC
Start: 2020-12-25 — End: ?
  Filled 2020-12-25: qty 6

## 2020-12-25 MED ORDER — FENTANYL CITRATE (PF) 100 MCG/2ML IJ SOLN
INTRAMUSCULAR | Status: AC
Start: 2020-12-25 — End: ?
  Filled 2020-12-25: qty 4

## 2020-12-25 MED ORDER — FENTANYL CITRATE (PF) 100 MCG/2ML IJ SOLN
INTRAMUSCULAR | Status: DC | PRN
Start: 2020-12-25 — End: 2020-12-25
  Administered 2020-12-25: 13:00:00 50 ug via INTRAVENOUS
  Administered 2020-12-25: 25 ug via INTRAVENOUS
  Administered 2020-12-25: 50 ug via INTRAVENOUS

## 2020-12-25 MED ORDER — LIDOCAINE HCL 2 % IJ SOLN WRAPPED RECORD
INTRAMUSCULAR | Status: DC | PRN
Start: 2020-12-25 — End: 2020-12-25
  Administered 2020-12-25 (×2): 5 mL
  Administered 2020-12-25: 18 mL

## 2020-12-25 SURGICAL SUPPLY — 33 items
BOWL SPONGE STRL 16OZ (Misc Medical Supply) ×2
CANISTER SUCTION 1200CC (Tubing/Suction) ×4
CANNULA NASAL FLARED W/7' TUBE (Misc Medical Supply) IMPLANT
CONNECTOR FLUID DISPENSING (Misc Medical Supply) ×2 IMPLANT
CONTAINER PRECISION SPECIMEN, 4OZ- STERILE (Misc Medical Supply) IMPLANT
CONTAINER PREFIL FORMALIN 60ML (Misc Medical Supply) IMPLANT
DEVICE MUCOSAL ATOMIZATION (Misc Medical Supply) ×2
DRAPE HALF SHEET MEDIUM (Drapes/towels) ×2
DRESSING SPONGE CURITY 4X4 16 PLY (Dressings/packing) IMPLANT
EXALT MODEL B SINGLE USE BRONCH- LARGE (Misc Surgical Supply) ×2
EXTENDER TRACHEA, STERILE (Misc Medical Supply) IMPLANT
GLOVE BIOGEL PI ULTRATOUCH SIZE 8 (Gloves/Gowns) IMPLANT
GLOVE SKINSENSE PF LF SZ 6.5 (Gloves/Gowns) IMPLANT
GLOVE SURGICAL BIOGEL SIZE 8 (Gloves/Gowns)
INTERSURG GREEN ADAPTOR AKA CONNECTOR TRACH SWIVEL (Misc Medical Supply)
MASK ADULT TRACH AEROSOL (Anesthesia Supply) IMPLANT
MASK OXYGEN ADULT SOFT DSP LF (Anesthesia Supply)
NEEDLE BLUNT FILL 18G X 1.5" (Needles/punch/cannula/biopsy) IMPLANT
SOLUTION IRR POUR BTL 0.9% NS 1000ML (Non-Pharmacy Meds/Solutions) IMPLANT
STOPCOCK 3-WAY W/SWIVEL MALE LUER LOCK (Needles/punch/cannula/biopsy) ×2 IMPLANT
SYRINGE HYPO LL 20CC (Needles/punch/cannula/biopsy) ×2 IMPLANT
SYRINGE HYPO LL 3CC (Needles/punch/cannula/biopsy) ×2 IMPLANT
SYRINGE HYPO LL 5CC (Needles/punch/cannula/biopsy) ×2
SYRINGE HYPO LS 10CC (Needles/punch/cannula/biopsy) ×4
SYRINGE HYPO LS 20CC (Needles/punch/cannula/biopsy) ×4
TOWELS OR BLUE 4-PACK STERILE, DISPOSABLE (Drapes/towels) ×2 IMPLANT
TRAP LUKENS SPECIMEN 40ML STERILE (Misc Medical Supply) IMPLANT
TRAP SPECIMEN 70CC LTX (Misc Medical Supply) ×2
TUBING CONN SUCTN 1/4 X 6 (Tubing/Suction) ×8 IMPLANT
TUBING CORR-A-FLEX CORRUGATED 72" (Tubing/Suction) IMPLANT
VALVE BIOPSY OLYMPUS MAJ-210- SINGLE USE (Tubing/Suction) ×2 IMPLANT
VALVE SUCTION OLYMPUS- SINGLE USE (Tubing/Suction) ×2
WARMER SCOPE 15" PRE SURG (Misc Medical Supply) IMPLANT

## 2020-12-25 NOTE — RN OR/Procedure Note (Signed)
BAL performed. 3 mg versed, 75 mcg fentanyl given intraop. Specimen sent to lab.    Pt tolerated procedure well. VSS. Returned to baseline. Post procedural CXR performed and reviewed by Dr. Velia Meyer. Discharge criteria met. Discharge instructions provided to pt and brother Jeneen Rinks). Pt discharged via wheelchair.

## 2020-12-25 NOTE — Op Note (Signed)
INTERVENTIONAL PULMONOLOGY OPERATIVE REPORT     DATE OF PROCEDURE: 12/25/2020    CC Referred Physician:  Self, Referred  No address on file    INDICATION FOR OPERATION:  Tiffany Chen is a 75 year old-year-old female who presents with lung cavity.  The nature, purpose, risks, benefits and alternatives to Bronchoscopy were discussed with the patient in detail.  Patient indicated a wish to proceed with surgery and informed consent was signed.    CONSENT : Obtained before the procedure. Its indications and potential complications and alternatives were discussed with the patient or surrogate. The patient or surrogate read and signed the provided consent form / provided consent over the phone. The consent was witnessed by an assisting medical professional.    PREOPERATIVE DIAGNOSIS: R91.8 Other nonspecific abnormal finding of lung field.    POSTOPERATIVE DIAGNOSIS:  R91.8 Other nonspecific abnormal finding of lung field.    PROCEDURE:    16109 Therapeutic aspiration initial episode  31624 Dx bronchoscope/lavage (BAL)          99152 Moderate sedation: initial 15 minutes    ATTENDING:    Wilber Oliphant MD PhD    ASSISTANT:   Daiva Eves MD    Support Staff:   RN: Hollie Beach  RT: Su Hoff    ANESTHESIA:   Local anesthesia with: Lidocaine 2% Solution ~64m via Mouth.   Procedure performed under moderate sedation.      The following medications were provided:  Versed  3 mg  Fentanyl  75 mcg    Physician/patient face-to-face anesthesia start time:   11:00    Physician/patient face-to-face anesthesia stop time:   11:30    Total moderate sedation time was 30 minutes.      Patient was monitored continuously one-to-one throughout the entire procedure by the attending physician while anesthesia was administered.   Sedation was administered by RN.     MONITORING : Pulse oximetry, heart rate, telemetry, and BP were continuously monitored by an independent trained observer that was present throughout the entire  procedure.    INSTRUMENT :   Flexible Therapeutic Bronchoscope    ESTIMATED BLOOD LOSS:   None    COMPLICATIONS: None    PROCEDURE IN DETAIL:  After the successful induction of anesthesia, a timeout was performed (confirming the patient's name, procedure type, and procedure location).    Initial Airway Inspection Findings:  Successful therapeutic aspiration was performed to clean out the Right Mainstem and Left Mainstem from mucus.     Bronchial alveolar lavage was performed at Apical Segment of RUL (RB1).  Instilled 60 cc of NS, suction returned with 20 cc of NS.  Samples sent for Cell Count, Microbiology (Cultures/Viral/Fungal) and Cytology.    The patient tolerated the procedure well.  There were no immediate complications.  At the conclusion of the operation, the patient was extubated in the operating room and transported to the recovery room in stable condition.     SPECIMEN(S):   BAL    IMPRESSION/PLAN: Tiffany Chen a 75year old-year-old female who presents for bronchoscopy for BAL.      GLajoyce Lauber MD, PhD  12/25/2020

## 2020-12-25 NOTE — H&P (Signed)
PRE-PROCEDURE HISTORY AND PHYSICAL     Tiffany Chen presents for her scheduled Procedure(s):  BRONCHOSCOPY, WITH BRONCHOALVEOLAR LAVAGE.    The indication for the procedure(s) is Nontuberculous mycobacterial disease of lung.    There have been no significant recent changes in the patient's medical status.    Past Medical History:   Diagnosis Date   . Cavitary lesion of lung 07/24/2020   . DM (diabetes mellitus) (CMS-HCC)    . 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    Allergies  Allergies   Allergen Reactions   . Black [employer]  Other       Medications  No current facility-administered medications on file prior to encounter.     Current Outpatient Medications on File Prior to Encounter   Medication Sig Dispense Refill   . albuterol 108 (90 Base) MCG/ACT inhaler Inhale 2 puffs by mouth every 6 hours as needed for Wheezing. 3 each 3   . Amikacin Sulfate Liposome (ARIKAYCE) 590 MG/8.4ML SUSP 590 mg by Nebulization route daily. 252 mL 11   . atorvastatin (LIPITOR) 10 MG tablet Take 10 mg by mouth daily.     [name] azithromycin (ZITHROMAX) 250 MG tablet Take 1 tablet (250 mg) by mouth daily. 30 tablet 11   . ethambutol (MYAMBUTOL) 400 MG tablet Take 2 tablets (800 mg) by mouth daily. 60 tablet 11   . ferrous sulfate 325 (65 Fe) MG tablet Take 1 tablet (325 mg) by mouth 3 times daily.     . fluoxetine (PROZAC) 20 MG tablet Take 20 mg by mouth daily.     [name] FLUOXETINE HCL PO 30 mg     . metFORMIN (GLUCOPHAGE) 500 mg tablet Take 1 tablet (500 mg) by mouth 2 times daily (with meals).     [name] omeprazole (PRILOSEC) 20 MG capsule Take 1 capsule (20 mg) by mouth every morning (before breakfast). 90 capsule 1   . Pediatric Multivitamins-Fl (MULTIVITAMINS/FL PO)      . ramipril (ALTACE) 2.5 MG capsule Take 2.5 mg by mouth daily.     . rifampin (RIFADIN) 300 MG capsule Take 2 capsules (600 mg) by mouth daily. 60 capsule 11   . sodium chloride 7 % NEBU 4 mL by Nebulization  route every 12 hours. 240 mL 11       Physical Examination  BP 110/61   Pulse 72   Temp 97.4 F (36.3 C)   Resp 24   Ht 5' 2.5" (1.588 m)   Wt 57.2 kg (126 lb)   SpO2 99%   BMI 22.68 kg/m   Body mass index is 22.68 kg/m.  ASA Status: 3 - Moderate to severe systemic disease that limits activity but not incapacitating  Mental Status: alert  Mallimpati Score: Class I - Soft palate, uvula, fauces, and pillars are visible.  Lungs: clear to auscultation  Heart: normal  Abdomen: abdomen is soft without significant tenderness, masses, organomegaly or guarding    LABS:  CBC:  Lab Results   Component Value Date    WBC 5.6 11/13/2020    HGB 10.3 (L) 11/13/2020    HCT 33.4 (L) 11/13/2020    PLT 290 11/13/2020     CHEM:  Lab Results   Component Value Date    NA 139 11/13/2020    K 4.6 11/13/2020    CL 103 11/13/2020    BICARB 28 11/13/2020    BUN 18 11/13/2020    CREAT  0.52 11/13/2020    GLU 134 (H) 11/13/2020    [place] 9.7 11/13/2020     COAG:  No results found for: PT, PTT, INR  LFTs:  Lab Results   Component Value Date    AST 20 11/13/2020    ALT 9 11/13/2020    ALK 59 11/13/2020    TP 7.0 11/13/2020    ALB 4.3 11/13/2020    TBILI 0.36 11/13/2020    DBILI <0.2 07/17/2020        ASSESSMENT AND PLAN   Blass has been evaluated and deemed appropriate to undergo the planned Procedure(s):  BRONCHOSCOPY, WITH BRONCHOALVEOLAR LAVAGE      Lajoyce Lauber, MD, PhD  12/25/2020

## 2020-12-25 NOTE — Telephone Encounter (Signed)
The Infusion Center has received your request for this new patient treatment of IVIG       Per your request the available appointment @ [facility] is  01/16/21 @ 9am     If this appointment is not appropriate, please notify myself Minerva Fester or Any of the Infusion Center Mangers for changes.    Visitor Policy has been Confirmed.    Thank you

## 2020-12-26 LAB — M. TUBERCULOSIS AND RIFAMPIN RESISTANCE PCR: M. tuberculosis PCR: NOT DETECTED

## 2020-12-26 LAB — AFB CULTURE W/STAIN: AFB Smear: POSITIVE — AB

## 2020-12-27 LAB — ASPERGILLUS GALACTOMANNAN AG, BRONCH WASH: Aspergillus Galactomannan Ag, Bronch Wash: NEGATIVE

## 2020-12-28 LAB — QUANT BRONCH CULTURE W/GRAM STAIN: Quant Bronch Wash Culture Result: <10000

## 2020-12-29 ENCOUNTER — Other Ambulatory Visit: Payer: Self-pay

## 2021-01-02 ENCOUNTER — Encounter (HOSPITAL_BASED_OUTPATIENT_CLINIC_OR_DEPARTMENT_OTHER): Payer: Self-pay | Admitting: Internal Medicine

## 2021-01-02 ENCOUNTER — Ambulatory Visit: Payer: Medicare Other | Attending: Internal Medicine | Admitting: Internal Medicine

## 2021-01-02 VITALS — BP 145/79 | HR 90 | Temp 96.7°F | Resp 16 | Ht 62.5 in

## 2021-01-02 DIAGNOSIS — A310 Pulmonary mycobacterial infection: Secondary | ICD-10-CM

## 2021-01-02 NOTE — Progress Notes (Signed)
REFERRING PHYSICIAN   Referring Provider:  No referring provider defined for this encounter.    Primary Care Provider:  Katheren Shams  [address] [place] [address]  PATIENT IDENTIFICATION   Tiffany Chen is a 75 year old female with a history of bronchiectasis, cavitary MAC lung infection, ruptured diverticulitis S/P colostomy in 2009, diabetes, ICH S/P embolization. She has a history of MAC lung infection treated multiple times with azithromycin 563m, Rifampin 6062m, ethambutol 160055mhree times weekly: 2018- 01/19/2018, 05/25/2018- 10/22/2019 and 12/22/2019- 02/16/2020.     CHIEF COMPLAINT / HISTORY OF PRESENT ILLNESS   Tiffany Chen a 75 70ar old female with a history of non TB mycobacterium infection who underwent bronchoscopy on 12/19. Smear is AFB +2.   TOBACCO HISTORY     Social History     Tobacco Use   Smoking Status Never   Smokeless Tobacco Never        PAST HISTORY     Probelms   Patient Active Problem List    Diagnosis Date Noted   . 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 Gastroesophageal reflux disease, unspecified whether esophagitis present 10/12/2020     Added automatically from request for surgery [id number]     . Hoarse voice quality 10/12/2020     Added automatically from request for surgery [id number]     . Cavitary lesion of lung 07/24/2020   . Acquired tracheobronchomegaly with bronchiectasis (CMS-HCC) 05/22/2020   . 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          Past Medical History   Past Medical History:   Diagnosis Date   . Cavitary lesion of lung 07/24/2020   . DM (diabetes mellitus) (CMS-HCC)    . 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      Family History   No family history on file.      Social History   Social History     Tobacco Use   . Smoking status: Never   . Smokeless tobacco: Never   Substance Use  Topics   . Alcohol use: Not on file     Social History     Social History Narrative   . Not on file        ALLERGIES     Allergies   Allergen Reactions   . [facility] Other       HOME MEDICATIONS     Prior to Admission medications    Medication Sig Start Date End Date Taking? Authorizing Provider   albuterol 108 (90 Base) MCG/ACT inhaler Inhale 2 puffs by mouth every 6 hours as needed for Wheezing. 08/11/20   ElmKatheren ShamsD   Amikacin Sulfate Liposome (ARIKAYCE) 590 MG/8.4ML SUSP 590 mg by Nebulization route daily. 11/20/20   ElmKatheren ShamsD   atorvastatin (LIPITOR) 10 MG tablet Take 10 mg by mouth daily.    Historical, Documentation   azithromycin (ZITHROMAX) 250 MG tablet Take 1 tablet (250 mg) by mouth daily. 07/24/20   Elmaraachli, WaeEstill BattenD   ethambutol (MYAMBUTOL) 400 MG tablet Take 2 tablets (800 mg) by mouth daily. 07/24/20   Elmaraachli, WaeEstill BattenD   ferrous sulfate 325 (65 Fe) MG tablet Take 1 tablet (325 mg) by mouth 3 times daily.    Historical, Documentation   fluoxetine (PROZAC) 20 MG tablet Take  20 mg by mouth daily.    Historical, Documentation   FLUOXETINE HCL PO 30 mg    Historical, Documentation   metFORMIN (GLUCOPHAGE) 500 mg tablet Take 1 tablet (500 mg) by mouth 2 times daily (with meals).    Historical, Documentation   omeprazole (PRILOSEC) 20 MG capsule Take 1 capsule (20 mg) by mouth every morning (before breakfast). 10/16/20   Bonnetta Barry, MD   Pediatric Multivitamins-Fl (MULTIVITAMINS/FL PO)     Historical, Documentation   ramipril (ALTACE) 2.5 MG capsule Take 2.5 mg by mouth daily.    Historical, Documentation   rifampin (RIFADIN) 300 MG capsule Take 2 capsules (600 mg) by mouth daily. 07/24/20   Elmaraachli, Estill Batten, MD   sodium chloride 7 % NEBU 4 mL by Nebulization route every 12 hours. 07/24/20   Katheren Shams, MD          REVIEW OF SYSTEMS   A 10+ review of systems was performed with pertinent positives and negatives noted above in the history of present illness.   Other systems were negative unless otherwise specified.    PHYSICAL EXAMINATION   BP 145/79 (BP Location: Left arm, BP Patient Position: Sitting, BP cuff size: Regular)   Pulse 90   Temp 96.7 F (35.9 C) (Temporal)   Resp 16   Ht 5' 2.5" (1.588 m)   SpO2 98%   BMI 22.68 kg/m    Oxygen Therapy  SpO2: 98 %        The patient was examined after initial history.  The patient had consented to video visit prior to our encounter.  General: The patient appeared calm and stable not in acute distress, conversant, no appearance of Cushingoid facies or acromegaly, well nourished appearing  Eyes: sclera were anicteric and conjunctiva were moist, no lid lag, stare or proptosis, strabismus  HENT: atraumatic, mucous membranes moist, oropharynx visualized  Neck: free range of motion, supple neck, no visible masses  Respiratory; breathing comfortably, speaking full sentences, no evidence of accessory muscle use or respiratory distress, no clubbing, no nasal flaring, no audible wheezing or stridor  Cardiovascular: no cyanosis or edema  Musculoskeletal: no deformities; full range of motion   Skin: no rashes or abrasions or ulcers  Neurological: no weakness, tremors, or seizure activity  Psychiatric: alert and oriented x 3, normal judgment, appropriate mood, affect, emotionally stable     LABS/STUDIES   All studies were reviewed personally.    CBC:  BMP:   Lab Results   Component Value Date    WBC 5.6 11/13/2020    WBC 5.8 11/10/2020    WBC 6.4 11/06/2020    HGB 10.3 (L) 11/13/2020    HGB 10.9 (L) 11/10/2020    HGB 10.4 (L) 11/06/2020    HCT 33.4 (L) 11/13/2020    HCT 34.8 11/10/2020    HCT 33.3 (L) 11/06/2020    PLT 290 11/13/2020    PLT 289 11/10/2020    PLT 302 11/06/2020    SEG 66 11/13/2020    SEG 65 11/10/2020    SEG 66 11/06/2020    LYMPHS 23 11/13/2020    LYMPHS 22 11/10/2020    LYMPHS 24 11/06/2020    MONOS 8 11/13/2020    MONOS 8 11/10/2020    MONOS 7 11/06/2020     Lab Results   Component Value Date    NA 139  11/13/2020    NA 140 11/10/2020    NA 136 11/06/2020    K 4.6 11/13/2020  K 5.0 11/10/2020    K 4.9 11/06/2020    CL 103 11/13/2020    CL 102 11/10/2020    CL 98 11/06/2020    BICARB 28 11/13/2020    BICARB 25 11/10/2020    BICARB 26 11/06/2020    BUN 18 11/13/2020    BUN 15 11/10/2020    BUN 13 11/06/2020    CREAT 0.52 11/13/2020    CREAT 0.51 11/10/2020    CREAT 0.49 (L) 11/06/2020    GLU 134 (H) 11/13/2020    GLU 126 (H) 11/10/2020    GLU 99 11/06/2020    [place] 9.7 11/13/2020    [place] 9.4 11/10/2020    [place] 9.6 11/06/2020        Coags:  LFTs:   No results found for: PTT, INR, PT   Lab Results   Component Value Date    ALK 59 11/13/2020    ALK 59 11/10/2020    ALK 61 11/06/2020    AST 20 11/13/2020    AST 20 11/10/2020    AST 36 (H) 11/06/2020    ALT 9 11/13/2020    ALT 13 11/10/2020    ALT 16 11/06/2020    TBILI 0.36 11/13/2020    TBILI 0.32 11/10/2020    TBILI 0.30 11/06/2020    DBILI <0.2 07/17/2020    TP 7.0 11/13/2020    TP 7.2 11/10/2020    TP 7.3 11/06/2020    ALB 4.3 11/13/2020    ALB 4.5 11/10/2020    ALB 4.4 11/06/2020        ABG   No results found for: ARTPH, ARTPCO2, ARTPO2, ARTHC03, ARTBE, ARTO2SAT        Microbiology:    No results found for: BLOODCULT, FUNGALBC, AFBBACTCULT, URINECULTURE, QTFERON, QUANTIFERON, CRYPTOAG, CRYPTOCAGCSF, COCCICF, COCCICFCSF, COCCIIMMDIIF, COCCIIMMCSF, HISTOAGUR, CMVDNAQT, CMVDNAQTCSF    No results found for this or any previous visit.  Imaging:   CT CHEST  11/09/2020   IMPRESSION:  Significant worsening of nontuberculous mycobacterial disease infection involving the right lung greater than the left lung as above.      CT chest 04/13/2020:   CONCLUSION:   1. 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   2. Cavitary lesion in the periphery of the right lung has increased in size measuring 2.7 cm, although may have a slightly less thickened and nodular wall.   3. Diffuse bronchiectasis most  pronounced in the right upper lobe.     Microbiology  BAL 04/28/2020 (from Dr. Cecile Hearing): AFB smear 3+, culture MAC (drug susceptibly testing sent to [facility]- pending)    Sputum - AFB  08/01/20: AFB smear negative, Cx MAC  08/03/20: AFB smear 1+, Cx MAC  08/07/20: AFB smear negative, Cx MAC    Sputum - Routine   08/01/20: NL Resp [place]ora   08/03/20: NL Resp Flora  08/07/20: NL Resp [place]ora    EKG   08/23/2020: sinus rhythm with PAC    Pulmonary Function Test:  04/28/2020 (care everywhere): noraml spito, normal volumes, mildly reduced DLCO      IMPRESSION   Tiffany Chen is a 75 year old female with a history of bronchiectasis, cavitary MAC lung infection, ruptured diverticulitis S/P colostomy in 2009, diabetes, ICH S/P embolization. She has a history of MAC lung infection treated multiple times with azithromycin 582m, Rifampin 6028m, ethambutol 160045mhree times weekly: 2018- 01/19/2018, 05/25/2018- 10/22/2019 and 12/22/2019- 02/16/2020.     PLAN  Recent bronchoscopy results show AFB 2+. Suspect MAC. Results communicated to patient and all questions answered. Patient has follow up planned with Dr. .Eugenio Hoes on 1/11.       Antony Contras, MD  01/02/2021    CC:  No referring provider defined for this encounter.  Patient Care Team:  Katheren Shams, MD as PCP - General (Critical Care)

## 2021-01-02 NOTE — Patient Instructions (Addendum)
Thank you for coming to see us in Interventional Pulmonology Clinic.     Please note the following is your plan:    If bronchoscopy/pleuroscopy is recommended:  - Nothing to eat or drink after midnight the night before bronchoscopy/pleuroscopy.  - COVID 19 testing: Call [phone], option 2 to schedule an appt for testing 48-72 hrs prior to surgery schedule    There are no diagnoses linked to this encounter.    Return if symptoms worsen or fail to improve.      --------------------------------------------------------------------------  Your Team   Clinic Schedule: Monday 8AM-12:30PM    PROVIDERS:   George Cheng, MD PhD  Brandis Matsuura, MD  Russell Miller, MD  Kelly Ball, NP    CLINICAL ADMINISTRATIVE ASSISTANTS:  Joslyn Riturban (M-F 8am-430pm)  Lluvia Mendaz (M-F 8am-430pm)  Georgina Sanchez (M-F 8am-430pm)    Phone: [phone]  Fax: [phone]    Please contact them for assistance with scheduling/rescheduling appointments with our providers and sharing of medical records    Please use MyChart to communicate any questions or needs for prescription refills.                                                                                                                                                  If you have a medical emergency or life threatening situaton, please call 911 or go to the nearest Emergency Room.     PRESCRIPTION REFILLS:  In an effort to decrease medication refill delays due to weekends, please note that all prescription refill requests should be completed prior to Thursdays at noon. Any requests made after this time may not be filled until the following Monday. You may also have your pharmacy fax a request to the fax number above.     AFTER-HOURS CARE:   If you develop a fever of 100.4F or greater for an hour, or at the first episode of a fever of 101F or greater, please call PATRICIA/GEORGINA, you may have to go to the ER; please come to the ER at [facility] if possible.  If the fever happens  after hours (On the weekends and/or Monday through Friday before 8:00am or after 4:30pm) please call the [facility] and ask to speak with the On-Call Pulmonology Fellow.      For any urgent/emergent symptoms that happen after hours (Weekends & Monday - Friday before 8:00am and after 4:30pm) please call  [place] [facility] and ask for the On-Call Pulmonology Fellow.    [facility] Phone List for Patients   Clinic Hours of Operation: Monday-Friday 8:00-4:30p.m. (closed holidays and weekends)     Information Desk: [phone]   ** General information: directions, telephone numbers, or available services     Radiology (CT) Scheduling: [phone]     PET/CT or PET scheduler: [phone]      Pulmonary Function and Exercise Laboratories:   [place]:  [phone]  [place]:  [phone]    [facility]: phone [phone]/ fax [phone]  Open M-F 9AM-5PM     Medical Records: [phone] / Fax [phone]   [email or web address]    Requests for Radiology Records / X-Ray Images  As of Aug. 28, 2018, all imaging results -- including computed tomography (CT), magnetic resonance imaging (MRI), ultrasound, and others -- are released to patients' [facility] account automatically. Normal results are released four days after the result is available, and abnormal results are released seven days after the result is available.    For previous imaging records, or if you do not have a  account, please use release of information authorization forms, or contact Radiology/Imaging Services at [phone]. Radiology may be able to release images by email or answer questions about release of X-rays and other images.     [facility] (for oxygen supplies): [phone]

## 2021-01-03 LAB — EMMI , UPPER GI ENDOSCOPY: EMMI Video Order Number: 16265352902

## 2021-01-03 LAB — BODY FLUID PATH REVIEW

## 2021-01-09 ENCOUNTER — Other Ambulatory Visit: Payer: Self-pay

## 2021-01-10 ENCOUNTER — Other Ambulatory Visit: Payer: Self-pay

## 2021-01-10 LAB — AFB CULTURE W/STAIN
AFB Smear Result: NEGATIVE
AFB Smear Result: NEGATIVE

## 2021-01-11 ENCOUNTER — Encounter (HOSPITAL_BASED_OUTPATIENT_CLINIC_OR_DEPARTMENT_OTHER): Payer: Self-pay | Admitting: Critical Care Medicine

## 2021-01-11 ENCOUNTER — Telehealth (HOSPITAL_BASED_OUTPATIENT_CLINIC_OR_DEPARTMENT_OTHER): Payer: Self-pay

## 2021-01-11 DIAGNOSIS — J471 Bronchiectasis with (acute) exacerbation: Secondary | ICD-10-CM

## 2021-01-11 NOTE — Telephone Encounter (Signed)
Dr. Selena Batten,    Tiffany Chen is scheduled for infusion of IVIG on 01/16/21.       Please review the treatment plan and change the date to reflect patient's scheduled appointment on 01/16/21.    Patient has a scheduled clinic visit on 03/19/21.    Thank you,     Russ Halo, RN

## 2021-01-12 ENCOUNTER — Other Ambulatory Visit: Payer: Medicare Other | Attending: Critical Care Medicine

## 2021-01-12 DIAGNOSIS — A310 Pulmonary mycobacterial infection: Secondary | ICD-10-CM

## 2021-01-12 LAB — CBC WITH DIFF, BLOOD
ANC-Automated: 5 10*3/uL (ref 1.6–7.0)
Abs Basophils: 0.1 10*3/uL (ref <0.2)
Abs Eosinophils: 0.1 10*3/uL (ref 0.0–0.5)
Abs Lymphs: 1.5 10*3/uL (ref 0.8–3.1)
Abs Monos: 0.8 10*3/uL (ref 0.2–0.8)
Basophils: 1 %
Eosinophils: 2 %
Hct: 39 % (ref 34.0–45.0)
Hgb: 12.1 gm/dL (ref 11.2–15.7)
Lymphocytes: 20 %
MCH: 26 pg (ref 26.0–32.0)
MCHC: 31 g/dL — ABNORMAL LOW (ref 32.0–36.0)
MCV: 83.7 um3 (ref 79.0–95.0)
MPV: 10.4 fL (ref 9.4–12.4)
Monocytes: 10 %
Plt Count: 294 10*3/uL (ref 140–370)
RBC: 4.66 10*6/uL (ref 3.90–5.20)
RDW: 15.7 % — ABNORMAL HIGH (ref 12.0–14.0)
Segs: 67 %
WBC: 7.4 10*3/uL (ref 4.0–10.0)

## 2021-01-12 LAB — COMPREHENSIVE METABOLIC PANEL, BLOOD
ALT (SGPT): 12 U/L (ref 0–33)
AST (SGOT): 23 U/L (ref 0–32)
Albumin: 4.1 g/dL (ref 3.5–5.2)
Alkaline Phos: 65 U/L (ref 40–130)
Anion Gap: 13 mmol/L (ref 7–15)
BUN: 17 mg/dL (ref 8–23)
Bicarbonate: 24 mmol/L (ref 22–29)
Bilirubin, Tot: 0.3 mg/dL (ref <1.2)
Calcium: 10.1 mg/dL (ref 8.5–10.6)
Chloride: 102 mmol/L (ref 98–107)
Creatinine: 0.58 mg/dL (ref 0.51–0.95)
Glucose: 111 mg/dL — ABNORMAL HIGH (ref 70–99)
Potassium: 4.6 mmol/L (ref 3.5–5.1)
Sodium: 139 mmol/L (ref 136–145)
Total Protein: 7.5 g/dL (ref 6.0–8.0)
eGFR Based on CKD-EPI 2021 Equation: >60 mL/min

## 2021-01-12 NOTE — Telephone Encounter (Signed)
From: [name]  To: [name], MD  Sent: 01/11/2021 4:51 PM PST  Subject: NEBULIZER FOR 7 PERCENT SOLUTION    The machine I have been using is fine, however the attachments (plastic) that are cleaned and boiled have warped and are breaking and cannot use machine properly. Called [facility] and [facility] and they seem to think that I need a new prescription rather than ordering the attachments.     Thank you and let me know how to proceed.

## 2021-01-12 NOTE — Telephone Encounter (Signed)
IVIG with date already with 01/16/21, orders signed

## 2021-01-12 NOTE — Telephone Encounter (Signed)
Signed nebulizer supplies order (see patient mychart message)

## 2021-01-13 ENCOUNTER — Encounter (HOSPITAL_BASED_OUTPATIENT_CLINIC_OR_DEPARTMENT_OTHER): Payer: Self-pay | Admitting: Critical Care Medicine

## 2021-01-15 ENCOUNTER — Other Ambulatory Visit: Payer: Self-pay

## 2021-01-16 ENCOUNTER — Encounter (INDEPENDENT_AMBULATORY_CARE_PROVIDER_SITE_OTHER): Payer: Self-pay | Admitting: Physician Assistant

## 2021-01-16 ENCOUNTER — Ambulatory Visit
Admission: RE | Admit: 2021-01-16 | Discharge: 2021-01-16 | Disposition: A | Discharge status: Home / Self Care | Payer: Medicare Other | Attending: Allergy | Admitting: Allergy

## 2021-01-16 ENCOUNTER — Ambulatory Visit (INDEPENDENT_AMBULATORY_CARE_PROVIDER_SITE_OTHER): Payer: Medicare Other | Admitting: Physician Assistant

## 2021-01-16 ENCOUNTER — Telehealth (INDEPENDENT_AMBULATORY_CARE_PROVIDER_SITE_OTHER): Payer: Medicare Other

## 2021-01-16 VITALS — BP 136/64 | HR 68 | Temp 96.7°F | Resp 17 | Ht 62.5 in | Wt 132.0 lb

## 2021-01-16 DIAGNOSIS — D809 Immunodeficiency with predominantly antibody defects, unspecified: Secondary | ICD-10-CM | POA: Insufficient documentation

## 2021-01-16 DIAGNOSIS — Z01818 Encounter for other preprocedural examination: Secondary | ICD-10-CM

## 2021-01-16 MED ORDER — DEXTROSE 5 % IV SOLN
INTRAVENOUS | Status: DC
Start: 2021-01-16 — End: 2021-01-17

## 2021-01-16 MED ORDER — CETIRIZINE HCL 10 MG OR TABS
10.0000 mg | ORAL_TABLET | Freq: Once | ORAL | Status: AC
Start: 2021-01-16 — End: 2021-01-16
  Administered 2021-01-16 (×2): 10 mg via ORAL
  Filled 2021-01-16: qty 1

## 2021-01-16 MED ORDER — IMMUNE GLOBULIN (HUMAN) 20 GM/200ML IJ SOLN
0.5000 g/kg | Freq: Once | INTRAMUSCULAR | Status: AC
Start: 2021-01-16 — End: 2021-01-16
  Administered 2021-01-16 (×2): 25 g via INTRAVENOUS
  Filled 2021-01-16: qty 200

## 2021-01-16 MED ORDER — DIPHENHYDRAMINE HCL 25 MG OR TABS OR CAPS CUSTOM
25.0000 mg | ORAL_CAPSULE | Freq: Once | ORAL | Status: DC | PRN
Start: 2021-01-16 — End: 2021-01-20

## 2021-01-16 MED ORDER — ACETAMINOPHEN 325 MG PO TABS
650.0000 mg | ORAL_TABLET | Freq: Once | ORAL | Status: AC
Start: 2021-01-16 — End: 2021-01-16
  Administered 2021-01-16 (×2): 650 mg via ORAL
  Filled 2021-01-16: qty 2

## 2021-01-16 NOTE — Patient Instructions (Signed)
PRE-PROCEDURAL ANESTHESIA INFORMATION AND INSTRUCTIONS     Your Procedure is currently scheduled at [facility] on 02/01/21  The scheduler will be contacting you with the check in time      FACILITY ADDRESS     [facility], [place], [address] [place], [place] [address]  Please check in at Patient Services in [place] on 1st floor       PARKING    [facility] (including [facility]), [facility], [facility] and [facility]: [facility] structure, Adult nurse structure, or Scientist, water quality parking (7am-5pm at [employer]; 5am-5pm at [employer]) for same cost as self-parking in the front entrance of the Medical Center   [email or web address]        QUESTIONS    If you have any questions between now and the day of your procedure, please do not hesitate to call us at:     [facility]: [phone]   You will receive a call from the GI department regarding your arrival  time      On the day of your Procedure, please arrive at the time provided by  [facility] at [facility]: [phone]           MEDICATION INSTRUCTIONS:       MEDICATION INSTRUCTIONS BEFORE PROCEDURE:      Medications to hold prior to your procedure: DO NOT take ramipril the morning of the procedure or the night before. DO NOT take metformin the morning of your procedure. Please see GI instructions regarding stopping antiacids/PPIs prior to procedure.    Please take your prescription medications/nebulizers/inhalers as scheduled with a small sip of water on the morning of surgery.    PLEASE HOLD ALL NSAIDS (non-steroidal anti-inflammatory drugs) SUCH AS advil, aleve, motrin, ibuprofen, relafen, lodine, feldene, Diclofenac, voltaren, indomethacin, naproxen, celebrex, Mobic 7 days before your procedure.       Please hold vitamins, supplements, herbs & fish oil 7 days  before your procedure.     It is OK to take acetaminophen (Tylenol) for pain around the time of your procedure unless you have liver disease.      AFTER YOUR VISIT WITH [place], IF YOU START TAKING A NEW MEDICATION BEFORE your procedure, PLEASE CALL us TO MAKE SURE IT IS SAFE TO TAKE & WILL NOT AFFECT YOUR SURGERY.         OSA INSTRUCTIONS:     If you use a CPAP machine, please bring the entire machine, including mask and tubing, with you on the day of your procedure, in case you need to use it in the recovery area. It is ok to leave it in your car or with your support person until the procedure is over.        EATING/DRINKING     Do not eat anything after midnight. Follow your bowel prep instructions for clear liquids on the day of the procedure.     Preparing for your Procedure:     Please wear clean loose-fitting clothes and leave valuables at home   Do not shave or remove body hair. Facial shaving is permitted.   Bring a picture ID and your insurance card, and be prepared to pay your deductible or co-insurance by cash, check, or credit card when you arrive.   Please make sure to arrange for an adult to drive you home. You CANNOT use UBER or LYFT. If you  do not have a ride, your procedure may be cancelled.       On The Day of Your Procedure:      Check in at the location mentioned above   COVID testing may be done on arrival to the Pre-op or Procedural areas according to the current CDPH mandate.   If you are a woman of child bearing age, please note that you may be asked to give a urine sample upon check-in  You will meet your anesthesia and GI teams in the preoperative holding area before the procedure.   Once surgery is over, you will wake up in the recovery room.  The adult driving you home will be contacted by the recovery room staff.   Visitor policy during the COVID-19 pandemic is subject to change. Current visitor policy can be found at [email or web address]       You medical  records are available to you at [email or web address] click sign up now.

## 2021-01-16 NOTE — Interdisciplinary (Signed)
Non-Chemotherapy Infusion Nursing Note - Langley is a 76 year old female who presents for infusion of IVIG 25 g    Vitals:    01/16/21 0834 01/16/21 0900 01/16/21 1003 01/16/21 1100   BP:  145/73 118/61 136/64   BP Location:  Left arm     BP Patient Position:  Sitting     Pulse:  76 70 68   Resp:  17 16 17    Temp:  96.8 F (36 C)  96.7 F (35.9 C)   TempSrc:  Temporal  Temporal   SpO2:  97%     Weight: 59.9 kg (132 lb)      Height: 5' 2.5" (1.588 m)        Pain Score: 0  Body surface area is 1.63 meters squared.  Body mass index is 23.76 kg/m.    Medications   dextrose 5% infusion (0 mL IntraVENOUS Stopped 01/16/21 1128)   diphenhydrAMINE (BENADRYL) tablet 25 mg (has no administration in time range)   immune globulin (human) (IGG) 25 g in 250 mL (GAMUNEX-C) infusion (0 g IntraVENOUS Completed 01/16/21 1123)   acetaminophen (TYLENOL) tablet 650 mg (650 mg Oral Given 01/16/21 0841)   cetirizine (ZYRTEC) tablet 10 mg (10 mg Oral Given 01/16/21 0841)       Pre-treatment nursing assessment:  Eating and drinking okay, Denies nausea, vomiting, diarrhea, fevers, or chills, or shortness of breath.  Came with brother  Printed info given    Destynee Angerer tolerated treatment well.    Post blood return: Brisk  Post-Flush: dextrose and Discontinued IV    Patient Education  Learner: Patient  Barriers to learning: No Barriers  Readiness to learn: Acceptance  Method: Explanation    Treatment Education: Information/teaching given to patient including: signs and symptoms of infection, bleeding, adverse reaction(s), symptom control, and when to notify MD.    [name] Prevention Education: Instructed patient to call for assistance.    Pain Education: Patient instructed to contact nurse if pain should develop or if their current pain therapy becomes ineffective.    Response: Verbalizes understanding    Discharge Plan  Discharge instructions given to patient.  Future appointments given and  reviewed with treatment plan.  Discharge Mode: Ambulatory  Discharge Time: 1130  Accompanied by: Self  Discharged To: Home

## 2021-01-16 NOTE — Anesthesia Preprocedure Evaluation (Addendum)
ANESTHESIA PRE-OPERATIVE EVALUATION    Patient Information    Name: Tiffany Chen    MRN: [id number]    DOB: [birth year]    Age: 76 year old    Sex: female  Procedure(s):  GI EGD BRAVO      Pre-op Vitals:   There were no vitals taken for this visit.        Primary language spoken:  English    ROS/Medical History:      History of Present Illness: 75 year old female with GERD presenting for pre-operative assessment in anticipation of above procedure at [facility] on 02/01/21 with Dr. Radene Knee.     PMH of bronchiectasis, cavitary MAC lung infection, ruptured diverticulitis S/P colostomy in 2009, diabetes, ICH S/P embolization.She has ahistory of MAC lung infection treated multiple times with azithromycin 57m, Rifampin 6077m, ethambutol 160026mhree times weekly: 2018- 01/19/2018, 05/25/2018- 10/22/2019 and 12/22/2019- 02/16/2020.     General:  able to climb flight of stairs/Exercise tolaerance >4 mets (1 FOS, 2 blocks ok. Lives on second floor and walks hilly terrain. Sometimes need to stop and rest due to breathing.),   Cardiovascular:  Pt denies chest pain, shortness of breath, palpitations, doe, leg swelling, syncope. Denies hx of cardiac problems.    EKG   08/23/2020: sinus rhythm with PAC   Anesthesia History:  no history of anesthetic complications,  no family history of anesthetic complications,   Pulmonary:   no home oxygen use,  no sleep apnea,  no recent URI/pneumonia,  History of MAC lung infection treated multiple times with azithromycin 500m35mifampin 600mg23mthambutol 1600mg 59me times weekly: 2018- 01/19/2018, 05/25/2018- 10/22/2019 and 12/22/2019- 02/16/2020. Currently resumed all 3 medications.    Monthly IVIG infusions started 01/16/21 to help with MAC infection tx    Has chronic mild cough that is stable. Occasional dyspnea with moderate exertion -stable    Changed diet, dropped weight, sleeping propped up has improved her symptoms.      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                      Labs in Epic  [facility] of identification verification completed   Reviewed AVS  Sent AVS to                Physical Exam    Airway:    Inter-inciser distance 3-4 cm  Prognanth Able    Mallampati: II  Neck ROM: full  TM distance: 4-5 cm  Short thick neck: No          Cardiovascular:  - cardiovascular exam normal         Pulmonary:  - pulmonary exam normal  Neuro/Neck/Skeletal/Skin:  - Neck/Neuro/Skeletal/Skin exam normal          Dental:  - normal exam    Abdominal:   - normal exam         Additional Clinical Notes:               Last  OSA (STOP BANG) Score:  No data recorded    Last OSA  (STOP) Score for   No data recorded                 Past Medical History:   Diagnosis Date   . Cavitary lesion of lung 07/24/2020   . DM (diabetes mellitus) (CMS-HCC)    . 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  Social History     Socioeconomic History   . Marital status: Widowed   Tobacco Use   . Smoking status: Never   . Smokeless tobacco: Never     Alcohol Use: Not on file       Current Outpatient Medications   Medication Sig Dispense Refill   . albuterol 108 (90 Base) MCG/ACT inhaler Inhale 2 puffs by mouth every 6 hours as needed for Wheezing. 3 each 3   . Amikacin Sulfate Liposome (ARIKAYCE)  590 MG/8.4ML SUSP 590 mg by Nebulization route daily. 252 mL 11   . atorvastatin (LIPITOR) 10 MG tablet Take 10 mg by mouth daily.     [name] azithromycin (ZITHROMAX) 250 MG tablet Take 1 tablet (250 mg) by mouth daily. 30 tablet 11   . ethambutol (MYAMBUTOL) 400 MG tablet Take 2 tablets (800 mg) by mouth daily. 60 tablet 11   . ferrous sulfate 325 (65 Fe) MG tablet Take 1 tablet (325 mg) by mouth 3 times daily.     . fluoxetine (PROZAC) 20 MG tablet Take 20 mg by mouth daily.     [name] FLUOXETINE HCL PO 30 mg     . metFORMIN (GLUCOPHAGE) 500 mg tablet Take 1 tablet (500 mg) by mouth 2 times daily (with meals).     [name] omeprazole (PRILOSEC) 20 MG capsule Take 1 capsule (20 mg) by mouth every morning (before breakfast). 90 capsule 1   . Pediatric Multivitamins-Fl (MULTIVITAMINS/FL PO)      . ramipril (ALTACE) 2.5 MG capsule Take 2.5 mg by mouth daily.     . rifampin (RIFADIN) 300 MG capsule Take 2 capsules (600 mg) by mouth daily. 60 capsule 11   . sodium chloride 7 % NEBU 4 mL by Nebulization route every 12 hours. 240 mL 11     No current facility-administered medications for this visit.     Allergies   Allergen Reactions   . Black [employer]  Other       Labs and Other Data  Lab Results   Component Value Date    NA 139 01/12/2021    K 4.6 01/12/2021    CL 102 01/12/2021    BICARB 24 01/12/2021    BUN 17 01/12/2021    CREAT 0.58 01/12/2021    GLU 111 (H) 01/12/2021    [place] 10.1 01/12/2021     Lab Results   Component Value Date    AST 23 01/12/2021    ALT 12 01/12/2021    ALK 65 01/12/2021    TP 7.5 01/12/2021    ALB 4.1 01/12/2021    TBILI 0.30 01/12/2021    DBILI <0.2 07/17/2020     Lab Results  Component Value Date    WBC 7.4 01/12/2021    RBC 4.66 01/12/2021    HGB 12.1 01/12/2021    HCT 39.0 01/12/2021    MCV 83.7 01/12/2021    MCHC 31.0 (L) 01/12/2021    RDW 15.7 (H) 01/12/2021    PLT 294 01/12/2021    MPV 10.4 01/12/2021    SEG 67 01/12/2021    LYMPHS 20 01/12/2021    MONOS 10 01/12/2021    EOS 2 01/12/2021    BASOS 1  01/12/2021     No results found for: INR, PTT  No results found for: ARTPH, ARTPO2, ARTPCO2    Anesthesia Plan:  Risks and Benefits of Anesthesia  I have personally performed an appropriate pre-anesthesia physical exam of the patient (including heart, lungs, and airway) prior to the anesthetic and reviewed the pertinent medical history, drug and allergy history, laboratory and imaging studies and consultations.   I have determined that the patient has had adequate assessment and testing.  I have validated the documentation of these elements of the patient exam and/or have made necessary changes to reflect my own observations during my pre-anesthesia exam.  Anesthetic techniques, invasive monitors, anesthetic drugs for induction, maintenance and post-operative analgesia, risks and alternatives have been explained to the patient and/or patient's representatives.    I have prescribed the anesthetic plan:         Planned anesthesia method: Monitored Anesthesia Care         ASA 3 (Severe systemic disease)     Potential anesthesia problems identified and risks including but not limited to the following were discussed with patient and/or patient's representative: Adverse or allergic drug reaction, Recall, Ocular injury, Dental injury or sore throat, Nerve injury and Injury to brain, heart and other organs    No Beta Blocker Indicated: Patient not on beta blockers    Planned monitoring method: Routine monitoring    Informed Consent:  Anesthetic plan and risks discussed with Patient.    Plan discussed with OR Nurse, Surgeon and Attending.

## 2021-01-17 ENCOUNTER — Encounter (HOSPITAL_BASED_OUTPATIENT_CLINIC_OR_DEPARTMENT_OTHER): Payer: Self-pay | Admitting: Critical Care Medicine

## 2021-01-17 ENCOUNTER — Encounter (HOSPITAL_COMMUNITY): Payer: Self-pay | Admitting: Hospital

## 2021-01-17 ENCOUNTER — Encounter (INDEPENDENT_AMBULATORY_CARE_PROVIDER_SITE_OTHER): Payer: Self-pay | Admitting: Physician Assistant

## 2021-01-17 ENCOUNTER — Ambulatory Visit: Payer: Medicare Other | Attending: Critical Care Medicine | Admitting: Critical Care Medicine

## 2021-01-17 VITALS — BP 134/74 | HR 82 | Temp 97.1°F | Resp 18 | Ht 62.25 in | Wt 137.4 lb

## 2021-01-17 DIAGNOSIS — A310 Pulmonary mycobacterial infection: Secondary | ICD-10-CM | POA: Insufficient documentation

## 2021-01-17 DIAGNOSIS — J471 Bronchiectasis with (acute) exacerbation: Secondary | ICD-10-CM

## 2021-01-17 DIAGNOSIS — J984 Other disorders of lung: Secondary | ICD-10-CM | POA: Insufficient documentation

## 2021-01-17 NOTE — Progress Notes (Signed)
Pulmonary Clinic Note    This patient was referred by Jonette Mate, MD  [address]  [place],  [place] [id number]    Chief Complaint:  Tiffany Chen is a 76 year old female who is in the clinic today with a chief complaint of follow up.    History of Present Illness, Initial visit 07/2020:   Establishing care for cavitary MAC lung infection.     PMH of bronchiectasis, cavitary MAC lung infection, ruptured diverticulitis S/P colostomy in 2009, diabetes, ICH S/P embolization.     MAC history- treatment courses were all azithromycin 537m, Rifampin 6045m, ethambutol 160014mhree times weekly:   - 2018- 01/19/2018  - 05/25/2018- 10/22/2019  - 12/22/2019- 02/16/2020.     She was being treated by Dr. ChoCarola Frostr MAC lung infection. She reports that daily dosing of medication and adding a 4th antibiotic were not discussed before 2022, despite her disease being cavitary.     In late 2021, CT scan showed enlarging cavity. December 2021 a bronch was performed but no MAC growth. Her doctor restarted azithro/Rifampin/ethmabutol based on imaging.   Feb 2022 she saw [name] stopped the antibiotics based on the lack of microbiologic data. Performed repeat bronch 04/28/2020 which has smear AFB 3+ and culture MAC (drug susc pending)     Over the last several months her symptoms have consisted of significant daily coughing, causing night time awakening most nights, night sweats every night, and weight loss >30lbs. Also significant fatigue. Dr. AshCecile Hearingescribed nebulized saline but she did not start it.   She tried to walk several miles each day despite her fatigue.     Significant heartburn if she lays shortly after eating.     ROS as above otherwise negative.     Miscellaneous:   Never smoker.   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   Her husband died after contracting COVID in the hospital.   [place].   Her brother is Vegan.   No hot tubs, no  swimming pools.     Last visit 10/18/2020 with me and 12/07/2020 with NP PenEarlie ServerStopped IV amikacin early November and started inhaled Arikayce. Continued azithromycin 250m78mily, rifampin 600mg81mly, Ethambutol 800mg 41my    She feels that she is improving overall in terms of energy level. Night sweats resolved. Coughing has decreased significantly. Hardly coughs, mostly happens after the inhaled treatments.    Weight is stable.    ROS as above, otherwise negative.     Patient Active Problem List   Diagnosis   . Acquired tracheobronchomegaly with bronchiectasis (CMS-HCC)   . MAI (mycobacterium avium-intracellulare) (CMS-HCC)   . Cavitary lesion of lung   . Gastroesophageal reflux disease, unspecified whether esophagitis present   . Hoarse voice quality   . 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 Cavitary lesion of lung 07/24/2020   . DM (diabetes mellitus) (CMS-HCC)    . 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 BRONCHOSCOPY  12/2020     Allergies   Allergen Reactions   . Black [employer]      Current Outpatient Medications on File Prior to Visit   Medication Sig Dispense Refill   . albuterol 108 (90 Base)  MCG/ACT inhaler Inhale 2 puffs by mouth every 6 hours as needed for Wheezing. 3 each 3   . Amikacin Sulfate Liposome (ARIKAYCE) 590 MG/8.4ML SUSP 590 mg by Nebulization route daily. 252 mL 11   . atorvastatin (LIPITOR) 10 MG tablet Take 10 mg by mouth daily.     [name] azithromycin (ZITHROMAX) 250 MG tablet Take 1 tablet (250 mg) by mouth daily. 30 tablet 11   . ethambutol (MYAMBUTOL) 400 MG tablet Take 2 tablets (800 mg) by mouth daily. 60 tablet 11   . ferrous sulfate 325 (65 Fe) MG tablet Take 1 tablet (325 mg) by mouth 3 times daily.     . [DISCONTINUED] fluoxetine (PROZAC) 20 MG tablet Take 20 mg by mouth daily.     [name] FLUOXETINE HCL PO 30 mg     .  metFORMIN (GLUCOPHAGE) 500 mg tablet Take 1 tablet (500 mg) by mouth 2 times daily (with meals).     [name] omeprazole (PRILOSEC) 20 MG capsule Take 1 capsule (20 mg) by mouth every morning (before breakfast). 90 capsule 1   . Pediatric Multivitamins-Fl (MULTIVITAMINS/FL PO)      . ramipril (ALTACE) 2.5 MG capsule Take 2.5 mg by mouth daily.     . rifampin (RIFADIN) 300 MG capsule Take 2 capsules (600 mg) by mouth daily. 60 capsule 11   . sodium chloride 7 % NEBU 4 mL by Nebulization route every 12 hours. 240 mL 11     Current Facility-Administered Medications on File Prior to Visit   Medication Dose Route Frequency Provider Last Rate Last Admin   . [EXPIRED] dextrose 5% infusion   IntraVENOUS Continuous Courtney Heys, MD   Stopped at 01/16/21 1128   . diphenhydrAMINE (BENADRYL) tablet 25 mg  25 mg Oral Once PRN Courtney Heys, MD       . Margrett Rud immune globulin (human) (IGG) 25 g in 250 mL [facility]) infusion  0.5 g/kg (Treatment Plan Ideal) IntraVENOUS Once Courtney Heys, MD   Completed at 01/16/21 1123     No family history on file.  Social History     Socioeconomic History   . Marital status: Widowed   Tobacco Use   . Smoking status: Never   . Smokeless tobacco: Never   Substance and Sexual Activity   . Alcohol use: Not Currently   . Drug use: Not Currently       Examination:  Vitals:    01/17/21 0800   BP: 134/74   BP Location: Left arm   BP Patient Position: Sitting   BP cuff size: Regular   Pulse: 82   Resp: 18   Temp: 97.1 F (36.2 C)   TempSrc: Temporal   SpO2: 97%   Weight: 62.3 kg (137 lb 6.4 oz)   Height: 5' 2.25" (1.581 m)     General: The patient is well appearing, sitting comfortably on the examination table with no evidence of respiratory distress.  Heart: regular rate and rhythm, no murmurs rubs or gallops, normally split S2, no accentuation of P2  Lungs: clear to ausculation and percussion, no wheezes, normal I:E ratio.   Extremities: no clubbing, cyanosis or edema. PICC  line LUE no surrounding erythema/induration    Laboratory Review:  Lab Results   Component Value Date    WBC 7.4 01/12/2021    HGB 12.1 01/12/2021    HCT 39.0 01/12/2021    PLT 294 01/12/2021     Lab Results   Component Value Date  NA 139 01/12/2021    K 4.6 01/12/2021    CL 102 01/12/2021    BICARB 24 01/12/2021    BUN 17 01/12/2021    CREAT 0.58 01/12/2021    GLU 111 (H) 01/12/2021    [place] 10.1 01/12/2021        Pulmonary Function Test Review:  04/28/2020 (care everywhere): noraml spito, normal volumes, mildly reduced DLCO    Radiology Review:  CT chest 04/13/2020:   CONCLUSION:   1. 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   2. Cavitary lesion in the periphery of the right lung has increased in size measuring 2.7 cm, although may have a slightly less thickened and nodular wall.   3. Diffuse bronchiectasis most pronounced in the right upper lobe.     Microbiology  BAL 04/28/2020 (from Dr. Cecile Hearing): AFB smear 3+, culture MAC (drug susceptibly testing sent to [facility]- pending)    Sputum - AFB  08/01/20: AFB smear negative, Cx MAC  08/03/20: AFB smear 1+, Cx MAC (MIS clarithro 0.5, amikacin 4)  08/07/20: AFB smear negative, Cx MAC  10/25: AFB smear 1+, Cx MAC  11/1, 11/09/2020: AFB smear negative, Cx MAC  12/25/2020: AFB smear 2+, Cx acid fast bacillus pending ID    Sputum - Routine   08/01/20: NL Resp Flora   08/03/20: NL Resp Flora  08/07/20: NL Resp Flora    EKG   08/23/2020: sinus rhythm with PAC     Impression/Plan:    Problem List:   - cavitary MAC lung infection  - bronchiectasis  - 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 Amikacin IV stopped early Novermber 2022, and  replaced with inhaled Arikayce daily  - add clofazimine  - refer to CT surgery, Dr. Berniece Andreas for consideration of right upper and middle lobectomy  - repeat CT chest with OV contrast in prep for potential lung resection  - repeat sputum x 3 (will all need to be induced)  - still pending PFT- asked patient to schedule along with 6 minute walk test  - follow up with GI re GERD -- pending EGD with [facility] scheduled 02/01/2021.   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 Encouraged exercise.       Patient Instructions   -continue the azithromycin, rifampin, ethambutol and inhaled Arikayce  - we are going to add another antibiotic, clofazimine to the above regimen. I will request from the FDA. We should have it in about 2 weeks  - please schedule an appointment in the cardiothoracic surgery clinic to discuss resection of the right upper and right middle lobe.   - please schedule the CT scan of the lung to be done some time in January (definitely before the appointment with the cardiothoracic surgeon)  -submit sputums monthly-- call to schedule induced sputums-[phone]. Please perform 2 in January then 2 more in February  -please have your blood drawn monthly (next time would be first week of February)  -schedule an EKG; call to schedule: [facility] or [facility]: [phone])  - schedule an audiogram to be done in March (Audiogram Department scheduling phone number:  [phone])  -continue Nebulized saline twice a day  -follow up with Ledon Np in one month and with me in 39month    Total time spent 658m:   - with patient, examining, history taking, planning management, counseling  - reviewing images  - records review  - interpretation of results  - documentation    Orders:  No orders of the defined types were placed in this encounter.    Orders Placed  This Encounter   Procedures   . CT Chest With Contrast     Orders Placed This Encounter   Procedures   . Induced Sputum Lab Service Request     Patient seen and discussed with attending, Dr. ElDebara Pickett   LaSabino DickMD  Fellow, Pulmonary and Critical Care Medicine

## 2021-01-17 NOTE — Telephone Encounter (Signed)
1st, 2nd and 3rd attempt made today to contact patient; Called patient, no response. Left message re: PFT referral and scheduling, phone number [phone] provided for return call to schedule appointment, mychart message sent and e-mail sent.

## 2021-01-17 NOTE — Patient Instructions (Addendum)
-  continue the azithromycin, rifampin, ethambutol and inhaled Arikayce  - we are going to add another antibiotic, clofazimine to the above regimen. I will request from the FDA. We should have it in about 2 weeks  - please schedule an appointment in the cardiothoracic surgery clinic to discuss resection of the right upper and right middle lobe.   - please schedule the CT scan of the lung to be done some time in January (definitely before the appointment with the cardiothoracic surgeon)  -submit sputums monthly-- call to schedule induced sputums-[phone]. Please perform 2 in January then 2 more in February  -please have your blood drawn monthly (next time would be first week of February)  -schedule an EKG; call to schedule: [facility] or [employer] [facility]: [phone])  - schedule an audiogram to be done in March (Audiogram Department scheduling phone number: [phone])  -continue Nebulized saline twice a day  -follow up with Ledon Np in one month and with me in 82months

## 2021-01-18 ENCOUNTER — Other Ambulatory Visit: Payer: Self-pay

## 2021-01-19 ENCOUNTER — Ambulatory Visit
Admission: RE | Admit: 2021-01-19 | Discharge: 2021-01-19 | Disposition: A | Discharge status: Home / Self Care | Payer: Medicare Other | Attending: Critical Care Medicine | Admitting: Critical Care Medicine

## 2021-01-19 ENCOUNTER — Telehealth (HOSPITAL_BASED_OUTPATIENT_CLINIC_OR_DEPARTMENT_OTHER): Payer: Self-pay | Admitting: Thoracic Surgery

## 2021-01-19 DIAGNOSIS — J984 Other disorders of lung: Secondary | ICD-10-CM

## 2021-01-19 DIAGNOSIS — A310 Pulmonary mycobacterial infection: Secondary | ICD-10-CM | POA: Insufficient documentation

## 2021-01-19 MED ORDER — IOHEXOL 350 MG/ML IV SOLN
75.0000 mL | Freq: Once | INTRAVENOUS | Status: AC
Start: 2021-01-19 — End: 2021-01-19
  Administered 2021-01-19 (×2): 75 mL via INTRAVENOUS

## 2021-01-19 NOTE — Telephone Encounter (Signed)
Patient Navigator called patient to introduce Patient Navigation program and the Patient Navigator role. Patient did not answer this phone call. Navigator left a voicemail message confirming appointment along with contact information should the patient have any questions or need additional information, including directions or parking information.

## 2021-01-19 NOTE — Progress Notes (Signed)
[facility]   Lung New Patient Evaluation    Introduction: Tiffany Chen is a 76 year old female who is being seen at the request of Katheren Shams for further evaluation of cavitary MAC lung infection.     Subjective:       HPI: Tiffany Chen is a 76 year old female with a PMH of bronchiectasis, cavitary MAC lung infection, ruptured diverticulitis S/P colostomy in 2009, diabetes, ICH S/P embolization. Smoking status: never smoker. History of radiation unrelated to lesion in question: no.    She notes weight loss and poor appetite. Symptoms have been present over the past few months and have been persistent. She has had unintentional weight loss (~30 lbs in the past 6 months). She denies any current fever, SOB/DOE, cough, chest pain, wheezing, and hemoptysis. She is able to climb 2 flights of stairs without difficulty. Recently walked up 5 flights of stairs without difficulty.     Work-up thus far has included CT chest.    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)      Oxygen usage: no    PSHx:    Past Surgical History:   Procedure Laterality Date    BRONCHOSCOPY  12/2020        Thoracic surgery?: no    Social Hx:   Social History     Socioeconomic History    Marital status: Widowed     Spouse name: Not on file    Number of children: Not on file    Years of education: Not on file    Highest education level: Not on file   Occupational History    Not on file   Tobacco Use    Smoking status: Never    Smokeless tobacco: Never   Substance and Sexual Activity    Alcohol use: Not Currently    Drug use: Not Currently    Sexual activity: Not on file   Other Topics Concern    Not on file   Social History Narrative    Not on file     Social Determinants of Health     Financial Resource Strain: Not on file    Food Insecurity: Not on file   Transportation Needs: Not on file   Physical Activity: Not on file   Stress: Not on file   Social Connections: Not on file   Intimate Partner Violence: Not on file   Housing Stability: Not on file        Accepts blood products?: yes    Family Hx:   No family history on file.     Allergies:   Allergies   Allergen Reactions    Psychologist, clinical Other        Medications:        Current Outpatient Medications   Medication Sig Dispense Refill    albuterol 108 (90 Base) MCG/ACT inhaler Inhale 2 puffs by mouth every 6 hours as needed for Wheezing. 3 each 3    Amikacin Sulfate Liposome (ARIKAYCE) 590 MG/8.4ML SUSP 590 mg by Nebulization route daily. 252 mL 11    atorvastatin (LIPITOR) 10 MG tablet Take 10 mg by mouth daily.      azithromycin (ZITHROMAX) 250 MG tablet Take 1 tablet (250 mg) by  mouth daily. 30 tablet 11    ethambutol (MYAMBUTOL) 400 MG tablet Take 2 tablets (800 mg) by mouth daily. 60 tablet 11    ferrous sulfate 325 (65 Fe) MG tablet Take 1 tablet (325 mg) by mouth 3 times daily.      FLUOXETINE HCL PO 30 mg      metFORMIN (GLUCOPHAGE) 500 mg tablet Take 1 tablet (500 mg) by mouth 2 times daily (with meals).      omeprazole (PRILOSEC) 20 MG capsule Take 1 capsule (20 mg) by mouth every morning (before breakfast). 90 capsule 1    Pediatric Multivitamins-Fl (MULTIVITAMINS/FL PO)       ramipril (ALTACE) 2.5 MG capsule Take 2.5 mg by mouth daily.      rifampin (RIFADIN) 300 MG capsule Take 2 capsules (600 mg) by mouth daily. 60 capsule 11    sodium chloride 7 % NEBU 4 mL by Nebulization route every 12 hours. 240 mL 11     No current facility-administered medications for this visit.     Facility-Administered Medications Ordered in Other Visits   Medication Dose Route Frequency Provider Last Rate Last Admin    diphenhydrAMINE (BENADRYL) tablet 25 mg  25 mg Oral Once PRN Courtney Heys, MD           Narcotic usage: No  Anticoagulation?: No    ROS:     Complete review of systems indicated no significant findings other than those noted above.     Advanced Care Planning  What gives your life meaning?  N/A    You would be willing to endure aggressive medical therapies as long as you could still perform which activities?:   N/A    Who would make medical decisions for you if you are unable to make decisions for yourself?   N/A     Objective:   Vitals: BP 152/87 (BP Location: Left arm, BP Patient Position: Sitting, BP cuff size: Regular)    Pulse 82    Temp 98 F (36.7 C) (Temporal)    Resp 18    Ht 5' 2.25" (1.581 m)    Wt 60.4 kg (133 lb 1.6 oz)    SpO2 99%    BMI 24.15 kg/m          ECOG Performance Scale: 0 Fully active, able to carry on all pre-disease performance without restriction  Smoking Status: not applicable (patient does not smoke)    Physical Exam:    General: healthy, alert, no distress  Skin:  Skin color, texture, turgor normal. No rashes or lesions.   Neurologic:   normal without focal findings, mental status, speech normal, alert and oriented x iii, PERLA  Neck:   normal and no adenopathy  Lymph Nodes:   Normal  Heart::  RRR. No murmurs  Lungs: clear to auscultation bilaterally  Breasts: Deferred  Abdomen:  Abdomen soft, non-tender. BS normal. No masses, organomegaly  Extremities: No lower extremity edema, calf tenderness, or asymmetric pulses; no clubbing or cyanosis; no petechiae, purpura, or ecchymoses  GU:  Deferred  Rectal: Deferred    DIAGNOSTIC STUDIES:  The following studies were reviewed by Dr. Berniece Andreas in clinic:  CT chest 01/20/2020: images reviewed       Impression:   Tiffany Chen is a 76 year old female with a PMH of bronchiectasis, cavitary MAC lung infection.  Evaluation today indicates she is an appropriate surgical candidate.     Plan:   -Right Robotic upper lobectomy with middle lobe wedge  resection      We had a comprehensive discussion about this diagnosis and surgical options.  The specifics of the surgery, expected  benefits, risks and adverse effects specifically bleeding, infection and pneumonia, alternate treatments and the consequences of not having the surgery were discussed.

## 2021-01-23 ENCOUNTER — Ambulatory Visit
Admission: RE | Admit: 2021-01-23 | Discharge: 2021-01-23 | Disposition: A | Discharge status: Home / Self Care | Payer: Medicare Other | Attending: Nurse Practitioner | Admitting: Nurse Practitioner

## 2021-01-23 ENCOUNTER — Encounter (HOSPITAL_BASED_OUTPATIENT_CLINIC_OR_DEPARTMENT_OTHER): Payer: Self-pay | Admitting: Critical Care Medicine

## 2021-01-23 DIAGNOSIS — J479 Bronchiectasis, uncomplicated: Secondary | ICD-10-CM | POA: Insufficient documentation

## 2021-01-23 NOTE — Telephone Encounter (Signed)
Patient returned my call this morning... Provided patient navigation orientation to [facility] including directions, parking information, appointment reminder and My Chart information (if applicable).  Patient navigator introduced patient navigation program and the role to assist with general support and resources. Patient appreciative of the navigator's introduction. All patient concerns were addressed during this phone call.

## 2021-01-24 ENCOUNTER — Ambulatory Visit: Payer: Medicare Other | Attending: Critical Care Medicine | Admitting: Thoracic Surgery

## 2021-01-24 VITALS — BP 152/87 | HR 82 | Temp 98.0°F | Resp 18 | Ht 62.25 in | Wt 133.1 lb

## 2021-01-24 DIAGNOSIS — Z09 Encounter for follow-up examination after completed treatment for conditions other than malignant neoplasm: Secondary | ICD-10-CM

## 2021-01-24 DIAGNOSIS — A310 Pulmonary mycobacterial infection: Secondary | ICD-10-CM | POA: Insufficient documentation

## 2021-01-24 DIAGNOSIS — J984 Other disorders of lung: Secondary | ICD-10-CM | POA: Insufficient documentation

## 2021-01-24 LAB — FUNGAL CULTURE: Fungus Culture Result: NO GROWTH

## 2021-01-24 NOTE — Patient Instructions (Signed)
PLAN:  - We will plan for a Right Robotic upper lobectomy with middle lobe wedge resection   - Debarah Crape will contact you with the surgery date and details     - Nothing to eat or drink after midnight the night before surgery   - Okay to continue all scheduled medications   - Please proceed to lab for blood work following your clinic appt.    SURGERY: (On day of surgery)  Report to 2nd floor [facility] [address] [place] [id number])  (Do not go to [facility] per [employer])      [facility] Phone List for Patients   Hours of Operation: Monday-Friday 8:00- 5:00p.m. (Closed Holidays and Weekends)   * Infusion Center Hours--- Monday-Friday 7:00-7:30 p.m.; Sat-Sun 8:00-4:30pm.     AFTER HOURS/WEEKEND EMERGENCY NUMBER [phone]  **Ask for the On-Call for Cardiothoracic Surgery     Physician: Dr. Chestine Spore  Administrative Assistant: Saverio Danker  Office Phone: [phone]  FAX: [phone]   Contact for forms, records, and scheduling.  Please call prior to coming in to pick up forms or letters to insure their availability; as they take 24-48 hours to prepare.  For medication refills please have your pharmacy fax a request to fax number above    Physician Assistant: Jose Persia, PA-C  Nurse Practitioner: Maylon Peppers, DNP     **For medication refills:   Please call at least 5 days before your medication completely runs out.  Have your pharmacy fax a request to the Fax number above.     Team: Child psychotherapist   LUNG: Percival Spanish, LCSW. Phone # [phone]  *For assistance in finding support groups, information regarding community resources, and any help you may need.       Insurance Information:  Geographical information systems officer: [phone]   [facility] Billing: [phone]   MED GROUP (Physician) Billing: [phone]   Doran Heater [facility] Financial Counselor: [phone] [email or web address]  This link has the insurance plans identified that [place] accepts:    [email or web address]

## 2021-01-25 ENCOUNTER — Telehealth (INDEPENDENT_AMBULATORY_CARE_PROVIDER_SITE_OTHER): Payer: Self-pay | Admitting: Gastroenterology

## 2021-01-25 NOTE — Telephone Encounter (Signed)
Pre-procedure call made and confirmed the following with the patient     Procedure date, time and location. yes  Transportation policy yes  Preparation instructions and that the patient has reviewed their procedure instruction letter yes  Patient is having and EGD Bravo b. Yes - Confirmed that the patient has been holding PPI medications for the past 7 days and has watched the Bravo video  Patient is aware of the current COVID protocols/testing guidelines yes  Patients procedure has been authorized yes    Are you currently experiencing any of the following symptoms (fever, new cough, shortness of breath or loss of taste and smell)?     a. No    The patient had no further questions at this time. Thank you.

## 2021-01-26 ENCOUNTER — Telehealth (HOSPITAL_COMMUNITY): Payer: Self-pay | Admitting: Thoracic Surgery

## 2021-01-26 NOTE — Telephone Encounter (Signed)
Left message for patient to call me and schedule surgery with Dr. Chestine Spore

## 2021-01-30 ENCOUNTER — Ambulatory Visit (HOSPITAL_BASED_OUTPATIENT_CLINIC_OR_DEPARTMENT_OTHER)
Admit: 2021-01-30 | Discharge: 2021-01-30 | Disposition: A | Discharge status: Home Health | Payer: Medicare Other | Attending: Critical Care Medicine | Admitting: Critical Care Medicine

## 2021-01-30 ENCOUNTER — Ambulatory Visit
Admission: RE | Admit: 2021-01-30 | Discharge: 2021-01-31 | Disposition: A | Discharge status: Home / Self Care | Payer: Medicare Other | Attending: Critical Care Medicine | Admitting: Critical Care Medicine

## 2021-01-30 ENCOUNTER — Other Ambulatory Visit: Payer: Self-pay

## 2021-01-30 DIAGNOSIS — J984 Other disorders of lung: Secondary | ICD-10-CM

## 2021-01-30 DIAGNOSIS — A310 Pulmonary mycobacterial infection: Secondary | ICD-10-CM

## 2021-01-30 NOTE — Procedures (Signed)
No note

## 2021-01-31 NOTE — H&P (Signed)
History and Physical    Indication for procedure:  76 year old with a PMHx significant for pulmonary MAC, DMII, who now presents for EGD with pH Bravo for GERD symptoms               Past Medical History:   Diagnosis Date   . Cavitary lesion of lung 07/24/2020   . DM (diabetes mellitus) (CMS-HCC)    . 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 BRONCHOSCOPY  12/2020     Allergies   Allergen Reactions   . Marshall display prior to admission medications because the patient has not been admitted in this contact.       There were no vitals taken for this visit.  General: Well developed, well nourished, in no apparent distress.  Lungs: Clear breath sounds bilaterally.  CV: Normal rate, regular rhythm, no significant murmur present.  Abdomen: Soft, nontender, normal bowel sounds present.    ASA Score:  2   Airway (Mallimpati) Score:  Class II - Soft palate, uvula, and fauces are visible.    Assessment and Plan  Proceed to planned procedure.    The patient has consented to the procedure, which will be done with sedation.  I have assessed the patient's status immediately prior to this procedure.  I have discussed pain management needs and options for the patient with the patient or caregiver.      The patient agrees to be full code for the duration of the procedure.    Sedation options, risks, and plans have been discussed with the patient or caregiver.  Questions were answered.  The patient or caregiver agrees to proceed as planned.    Bonnetta Barry

## 2021-02-01 ENCOUNTER — Other Ambulatory Visit: Payer: Self-pay

## 2021-02-01 ENCOUNTER — Encounter (HOSPITAL_BASED_OUTPATIENT_CLINIC_OR_DEPARTMENT_OTHER): Admission: RE | Disposition: A | Discharge status: Home Health | Payer: Self-pay | Attending: Gastroenterology

## 2021-02-01 ENCOUNTER — Ambulatory Visit (HOSPITAL_BASED_OUTPATIENT_CLINIC_OR_DEPARTMENT_OTHER): Payer: Medicare Other | Admitting: Student in an Organized Health Care Education/Training Program

## 2021-02-01 ENCOUNTER — Ambulatory Visit
Admission: RE | Admit: 2021-02-01 | Discharge: 2021-02-01 | Disposition: A | Discharge status: Home / Self Care | Payer: Medicare Other | Attending: Gastroenterology | Admitting: Gastroenterology

## 2021-02-01 ENCOUNTER — Ambulatory Visit (HOSPITAL_BASED_OUTPATIENT_CLINIC_OR_DEPARTMENT_OTHER): Payer: Medicare Other | Admitting: Physician Assistant

## 2021-02-01 DIAGNOSIS — D809 Immunodeficiency with predominantly antibody defects, unspecified: Secondary | ICD-10-CM

## 2021-02-01 DIAGNOSIS — R490 Dysphonia: Secondary | ICD-10-CM

## 2021-02-01 DIAGNOSIS — K219 Gastro-esophageal reflux disease without esophagitis: Secondary | ICD-10-CM

## 2021-02-01 DIAGNOSIS — E119 Type 2 diabetes mellitus without complications: Secondary | ICD-10-CM

## 2021-02-01 DIAGNOSIS — K3189 Other diseases of stomach and duodenum: Secondary | ICD-10-CM | POA: Insufficient documentation

## 2021-02-01 DIAGNOSIS — Z886 Allergy status to analgesic agent status: Secondary | ICD-10-CM | POA: Insufficient documentation

## 2021-02-01 DIAGNOSIS — Z7984 Long term (current) use of oral hypoglycemic drugs: Secondary | ICD-10-CM | POA: Insufficient documentation

## 2021-02-01 DIAGNOSIS — Z79899 Other long term (current) drug therapy: Secondary | ICD-10-CM

## 2021-02-01 DIAGNOSIS — K2950 Unspecified chronic gastritis without bleeding: Secondary | ICD-10-CM

## 2021-02-01 LAB — ECG 12-LEAD
ATRIAL RATE: 74 {beats}/min
ECG INTERPRETATION: NORMAL
P AXIS: 45 degrees
PR INTERVAL: 170 ms
QRS INTERVAL/DURATION: 72 ms
QT: 372 ms
QTc (Bazett): 412 ms
R AXIS: 61 degrees
T AXIS: 69 degrees
VENTRICULAR RATE: 74 {beats}/min

## 2021-02-01 LAB — GLUCOSE (POCT): Glucose (POCT): 134 mg/dL — ABNORMAL HIGH (ref 70–99)

## 2021-02-01 SURGERY — ESOPHAGOGASTRODUODENOSCOPY (EGD) USING BRAVO DEVICE
Anesthesia: Monitored Anesthesia Care (MAC)

## 2021-02-01 MED ORDER — CLOFAZIMINE IRB 21-0063 50 MG CAPSULE
0 refills | Status: DC
Start: 2021-02-01 — End: 2021-06-19
  Filled 2021-02-01: qty 200, 100d supply, fill #0

## 2021-02-01 MED ORDER — LIDOCAINE HCL 2 % IJ SOLN WRAPPED RECORD
INTRAMUSCULAR | Status: DC | PRN
Start: 2021-02-01 — End: 2021-02-01
  Administered 2021-02-01 (×2): 50 mg via INTRAVENOUS

## 2021-02-01 MED ORDER — PROPOFOL 1000 MG/100ML IV EMUL
INTRAVENOUS | Status: DC | PRN
Start: 2021-02-01 — End: 2021-02-01
  Administered 2021-02-01: 90 ug/kg/min via INTRAVENOUS
  Administered 2021-02-01 (×2): 125 ug/kg/min via INTRAVENOUS

## 2021-02-01 MED ORDER — NALOXONE HCL 0.4 MG/ML IJ SOLN
0.1000 mg | INTRAMUSCULAR | Status: DC | PRN
Start: 2021-02-01 — End: 2021-02-01
  Filled 2021-02-01: qty 0.25

## 2021-02-01 MED ORDER — METOPROLOL TARTRATE 5 MG/5ML IV SOLN
5.0000 mg | INTRAVENOUS | Status: DC | PRN
Start: 2021-02-01 — End: 2021-02-01

## 2021-02-01 MED ORDER — LACTATED RINGERS IV SOLN
INTRAVENOUS | Status: DC | PRN
Start: 2021-02-01 — End: 2021-02-01

## 2021-02-01 MED ORDER — PROPOFOL 200 MG/20ML IV EMUL
INTRAVENOUS | Status: DC | PRN
Start: 2021-02-01 — End: 2021-02-01
  Administered 2021-02-01: 70 mg via INTRAVENOUS
  Administered 2021-02-01: 30 mg via INTRAVENOUS
  Administered 2021-02-01: 10:00:00 70 mg via INTRAVENOUS

## 2021-02-01 SURGICAL SUPPLY — 2 items
BIOPSY FORCEP RADIAL JAW 4 2.8MM X 240CM, LARGE W/NEEDLE (Needles/punch/cannula/biopsy) ×2 IMPLANT
CAPSULE BRAVO W/DELIVERY SYSM (Implanted pumps/transmitters) ×2 IMPLANT

## 2021-02-01 NOTE — Discharge Instructions (Addendum)
Post Procedure Instructions:    1) If you received sedation today you will need someone to take you home because it can take up to a full day for the effects of the sedation to wear off. Don't drive, drink alcohol, or go back to work for the rest of the day.    2) You may feel bloated or pass gas for a few hours after the exam, as you clear air from your intestine. Walking may help relieve discomfort.     3) If you had biopsies taken, your endoscopist will instruct you how to obtain the results (I.e. office visit, mychart, by phone, or letter). If you do not receive pathology results within 14 days then call [phone] and ask for your results.     If you have severe symptoms, then go to the nearest emergency room.  If you pass blood from your rectum (greater than 2 tablespoons), you have severe abdominal pain, fever>100F, within 48 hours post-procedure or any other concerns about your procedure then phone the [place] [facility] Nurse/Doctor:  Monday through Friday 8am-5pm [phone]  Nights and weekends [phone] [facility] operator) and ask to have the Gastroenterology Physician on call contacted who will return your call.  GENERAL INFORMATION    The Bravo pH monitoring system measures how much acid is in your esophagus. The study is performed over 96 hours. The system uses a capsule, the size of an eraser, that is attached to the esophageal lining with a clip, during your sedated endoscopy (a procedure where a camera is placed into your mouth and advanced to look at your esophagus, stomach and duodenum). The probe monitors the acid in the esophagus and transmits the information to a recorder that is worn on a belt.         During the recording, you will go about your usual activities, such as, eating, sleeping, and working. Meals, periods of sleep, and symptoms are recorded by you in a diary and by pushing buttons on the recorder. The diary helps [place] to interpret the results. You will return after the study is  completed to turn in the recorder and diary. The data on the recorder is then downloaded into the computer for analysis.    The capsule will eventually fall off the esophageal lining, usually after several days, and will pass in the stool. The capsule is not reusable and you do not need to retrieve the capsule.     Medications to discontinue prior to Bravo:  Seven days before the procedure, discontinue all Proton Pump Inhibitors, including Prilosec (omeprazole), Nexium (esomeprazole), Aciphex (rabeprazole), Prevacid (lansoprazole), Protonix (pantoprazole), Zegerid (omeprazole + bicarb), Dexilant (dexlansoprazole).   Two days (48 hours) before the procedure discontinue all histamine blockers including Zantac (ranitidine), Tagamet (cimetidine), Axis (nizatidine), Pepcid (famotidine).   24 hours before the procedure, stop Carafate (Sucralfate) all antacids such as TUMS, Rolaids, Alka-Seltzer, Gaviscon, Maalox, Milk of Magnesia, Mylanta, Phillips, Riopan or any other brands.     Contraindications to use of the Bravo pH monitoring system include pacemakers, implantable cardiac defibrillators, and nickel allergies.    Please watch the following video for more information:  ENGLISH: [email or web address]  SPANISH: [email or web address]      INSTRUCTIONS AND TROUBLESHOOTING FOLLOWING BRAVO PLACEMENT    HOW TO USE BRAVO DEVICE AND TROUBLESHOOTING  BRAVO device is only active when display is backlit. If it is not backlit, this means it is in sleep mode. Press any button. Once backlit, press intended button.  Beeping  If the [facility]  device is too far from the capsule, the receiver will beep and the #1 disappears from the screen. There will also be a red flash below the square button. To reconnect, hold the recorder to the chest until the light turns blue.  If there is interference from other devices, this may also cause beeping. You can try changing locations, going into a different room, going  outside, etc.  The BRAVO device is expected to turn off after 96 hours.    WHAT TO RECORD  Meals: Durational Event (this means the event has a START and STOP time)  Every time you eat or drink anything.   Log drinks outside of meals.  Press fork/knife display button to start the meal. Also record the time and event in the diary.  Press the fork/knife button to end the meal and record the time in the diary, next to the start time.   Lying down: Durational Event (this means the event has a START and STOP time)  When you lie down or recline >45 degrees  Press bed button to start lying down time and bed button to end lying down time. Record in the diary as described above.  Symptoms: NOT durational events (the event occurs and the duration of the event is not recorded)  See your diary for specific symptoms  Record by pressing appropriate symptom button on recorder and record the time in the diary and check off the appropriate symptom. You do not need to continuously press button for the same symptom event.  The corresponding shape button will turn on for 3 seconds and a beep will sound to indicate that a symptom has been recorded.      OTHER  Limit drinks between meals. It is ok to have sips of water throughout the day, but avoid carbonated or flavored water/drinks.  Do not chew gum or suck on hard candy during the 96 hours of the study  It is ok to shower, but please try to always stay within 3 feet of the recorder.   No antacids or proton pump inhibitors throughout the 96 hours of the study including Prilosec (omeprazole), Nexium (esomeprazole), Aciphex (rabeprazole), Prevacid (lansoprazole), Protonix (pantoprazole), Zegerid (omeprazole + bicarb), Dexilant (dexlansoprazole), Zantac (ranitidine), Tagamet (cimetidine), Axis (nizatidine), Pepcid (famotidine), TUMS and/or Gaviscon Advance    PAIN AFTER PROCEDURE  The capsule device may cause pain in the chest or pain with swallowing. If you have pain, we encourage stress  reduction, relaxation and belly breathing.  You can try drinking colder drinks, such as milk shakes or ice shakes, to help decrease the pain and inflammation.  If the pain is worsening, radiating to the jaw, associated with shortness of breath, you can no longer swallow, or you have a fever > 100 F (27.8 F) you can call the Gastroenterology team at the numbers indicated below.    PRECAUTIONS AFTER PROCEDURE  You cannot have an MRI (Magnetic Resonance Imaging) during the test and for 30 days afterwards.  If you require an MRI, an X-ray of the chest and abdomen can be done to ensure the capsule device has passed out of your intestines. If you require a CT scan, this is ok to get during your Bravo study.  GENERAL INFORMATION    The Bravo pH monitoring system measures how much acid is in your esophagus. The study is performed over 96 hours. The system uses a capsule, the size of an eraser, that is attached to the esophageal lining with a clip,  during your sedated endoscopy (a procedure where a camera is placed into your mouth and advanced to look at your esophagus, stomach and duodenum). The probe monitors the acid in the esophagus and transmits the information to a recorder that is worn on a belt.         During the recording, you will go about your usual activities, such as, eating, sleeping, and working. Meals, periods of sleep, and symptoms are recorded by you in a diary and by pushing buttons on the recorder. The diary helps [place] to interpret the results. You will return after the study is completed to turn in the recorder and diary. The data on the recorder is then downloaded into the computer for analysis.    The capsule will eventually fall off the esophageal lining, usually after several days, and will pass in the stool. The capsule is not reusable and you do not need to retrieve the capsule.     Medications to discontinue prior to Bravo:  Seven days before the procedure, discontinue all Proton Pump Inhibitors,  including Prilosec (omeprazole), Nexium (esomeprazole), Aciphex (rabeprazole), Prevacid (lansoprazole), Protonix (pantoprazole), Zegerid (omeprazole + bicarb), Dexilant (dexlansoprazole).   Two days (48 hours) before the procedure discontinue all histamine blockers including Zantac (ranitidine), Tagamet (cimetidine), Axis (nizatidine), Pepcid (famotidine).   24 hours before the procedure, stop Carafate (Sucralfate) all antacids such as TUMS, Rolaids, Alka-Seltzer, Gaviscon, Maalox, Milk of Magnesia, Mylanta, Phillips, Riopan or any other brands.     Contraindications to use of the Bravo pH monitoring system include pacemakers, implantable cardiac defibrillators, and nickel allergies.    Please watch the following video for more information:  ENGLISH: [email or web address]  SPANISH: [email or web address]      INSTRUCTIONS AND TROUBLESHOOTING FOLLOWING BRAVO PLACEMENT    HOW TO USE BRAVO DEVICE AND TROUBLESHOOTING  BRAVO device is only active when display is backlit. If it is not backlit, this means it is in sleep mode. Press any button. Once backlit, press intended button.  Beeping  If the BRAVO device is too far from the capsule, the receiver will beep and the #1 disappears from the screen. There will also be a red flash below the square button. To reconnect, hold the recorder to the chest until the light turns blue.  If there is interference from other devices, this may also cause beeping. You can try changing locations, going into a different room, going outside, etc.  The BRAVO device is expected to turn off after 96 hours.    WHAT TO RECORD  Meals: Durational Event (this means the event has a START and STOP time)  Every time you eat or drink anything.   Log drinks outside of meals.  Press fork/knife display button to start the meal. Also record the time and event in the diary.  Press the fork/knife button to end the meal and record the time in the diary, next to the start time.    Lying down: Durational Event (this means the event has a START and STOP time)  When you lie down or recline >45 degrees  Press bed button to start lying down time and bed button to end lying down time. Record in the diary as described above.  Symptoms: NOT durational events (the event occurs and the duration of the event is not recorded)  See your diary for specific symptoms  Record by pressing appropriate symptom button on recorder and record the time in the diary and check off the appropriate symptom. You do not need  to continuously press button for the same symptom event.  The corresponding shape button will turn on for 3 seconds and a beep will sound to indicate that a symptom has been recorded.      OTHER  Limit drinks between meals. It is ok to have sips of water throughout the day, but avoid carbonated or flavored water/drinks.  Do not chew gum or suck on hard candy during the 96 hours of the study  It is ok to shower, but please try to always stay within 3 feet of the recorder.   No antacids or proton pump inhibitors throughout the 96 hours of the study including Prilosec (omeprazole), Nexium (esomeprazole), Aciphex (rabeprazole), Prevacid (lansoprazole), Protonix (pantoprazole), Zegerid (omeprazole + bicarb), Dexilant (dexlansoprazole), Zantac (ranitidine), Tagamet (cimetidine), Axis (nizatidine), Pepcid (famotidine), TUMS and/or Gaviscon Advance    PAIN AFTER PROCEDURE  The capsule device may cause pain in the chest or pain with swallowing. If you have pain, we encourage stress reduction, relaxation and belly breathing.  You can try drinking colder drinks, such as milk shakes or ice shakes, to help decrease the pain and inflammation.  If the pain is worsening, radiating to the jaw, associated with shortness of breath, you can no longer swallow, or you have a fever > 100 F (27.8 F) you can call the Gastroenterology team at the numbers indicated below.    PRECAUTIONS AFTER PROCEDURE  You cannot have an MRI  (Magnetic Resonance Imaging) during the test and for 30 days afterwards.  If you require an MRI, an X-ray of the chest and abdomen can be done to ensure the capsule device has passed out of your intestines. If you require a CT scan, this is ok to get during your Bravo study.  GENERAL INFORMATION    The Bravo pH monitoring system measures how much acid is in your esophagus. The study is performed over 96 hours. The system uses a capsule, the size of an eraser, that is attached to the esophageal lining with a clip, during your sedated endoscopy (a procedure where a camera is placed into your mouth and advanced to look at your esophagus, stomach and duodenum). The probe monitors the acid in the esophagus and transmits the information to a recorder that is worn on a belt.         During the recording, you will go about your usual activities, such as, eating, sleeping, and working. Meals, periods of sleep, and symptoms are recorded by you in a diary and by pushing buttons on the recorder. The diary helps [place] to interpret the results. You will return after the study is completed to turn in the recorder and diary. The data on the recorder is then downloaded into the computer for analysis.    The capsule will eventually fall off the esophageal lining, usually after several days, and will pass in the stool. The capsule is not reusable and you do not need to retrieve the capsule.     Medications to discontinue prior to Bravo:  Seven days before the procedure, discontinue all Proton Pump Inhibitors, including Prilosec (omeprazole), Nexium (esomeprazole), Aciphex (rabeprazole), Prevacid (lansoprazole), Protonix (pantoprazole), Zegerid (omeprazole + bicarb), Dexilant (dexlansoprazole).   Two days (48 hours) before the procedure discontinue all histamine blockers including Zantac (ranitidine), Tagamet (cimetidine), Axis (nizatidine), Pepcid (famotidine).   24 hours before the procedure, stop Carafate (Sucralfate) all antacids such  as TUMS, Rolaids, Alka-Seltzer, Gaviscon, Maalox, Milk of Magnesia, Mylanta, Phillips, Riopan or any other brands.  Contraindications to use of the Bravo pH monitoring system include pacemakers, implantable cardiac defibrillators, and nickel allergies.    Please watch the following video for more information:  ENGLISH: [email or web address]  SPANISH: [email or web address]      INSTRUCTIONS AND TROUBLESHOOTING FOLLOWING BRAVO PLACEMENT    HOW TO USE BRAVO DEVICE AND TROUBLESHOOTING  BRAVO device is only active when display is backlit. If it is not backlit, this means it is in sleep mode. Press any button. Once backlit, press intended button.  Beeping  If the BRAVO device is too far from the capsule, the receiver will beep and the #1 disappears from the screen. There will also be a red flash below the square button. To reconnect, hold the recorder to the chest until the light turns blue.  If there is interference from other devices, this may also cause beeping. You can try changing locations, going into a different room, going outside, etc.  The BRAVO device is expected to turn off after 96 hours.    WHAT TO RECORD  Meals: Durational Event (this means the event has a START and STOP time)  Every time you eat or drink anything.   Log drinks outside of meals.  Press fork/knife display button to start the meal. Also record the time and event in the diary.  Press the fork/knife button to end the meal and record the time in the diary, next to the start time.   Lying down: Durational Event (this means the event has a START and STOP time)  When you lie down or recline >45 degrees  Press bed button to start lying down time and bed button to end lying down time. Record in the diary as described above.  Symptoms: NOT durational events (the event occurs and the duration of the event is not recorded)  See your diary for specific symptoms  Record by pressing appropriate symptom button on recorder and  record the time in the diary and check off the appropriate symptom. You do not need to continuously press button for the same symptom event.  The corresponding shape button will turn on for 3 seconds and a beep will sound to indicate that a symptom has been recorded.      OTHER  Limit drinks between meals. It is ok to have sips of water throughout the day, but avoid carbonated or flavored water/drinks.  Do not chew gum or suck on hard candy during the 96 hours of the study  It is ok to shower, but please try to always stay within 3 feet of the recorder.   No antacids or proton pump inhibitors throughout the 96 hours of the study including Prilosec (omeprazole), Nexium (esomeprazole), Aciphex (rabeprazole), Prevacid (lansoprazole), Protonix (pantoprazole), Zegerid (omeprazole + bicarb), Dexilant (dexlansoprazole), Zantac (ranitidine), Tagamet (cimetidine), Axis (nizatidine), Pepcid (famotidine), TUMS and/or Gaviscon Advance    PAIN AFTER PROCEDURE  The capsule device may cause pain in the chest or pain with swallowing. If you have pain, we encourage stress reduction, relaxation and belly breathing.  You can try drinking colder drinks, such as milk shakes or ice shakes, to help decrease the pain and inflammation.  If the pain is worsening, radiating to the jaw, associated with shortness of breath, you can no longer swallow, or you have a fever > 100 F (27.8 F) you can call the Gastroenterology team at the numbers indicated below.    PRECAUTIONS AFTER PROCEDURE  You cannot have an MRI (Magnetic Resonance Imaging) during the test and for 30  days afterwards.  If you require an MRI, an X-ray of the chest and abdomen can be done to ensure the capsule device has passed out of your intestines. If you require a CT scan, this is ok to get during your Bravo study.

## 2021-02-01 NOTE — Anesthesia Postprocedure Evaluation (Signed)
Anesthesia Post Note    Patient: Tiffany Chen    Procedure(s) Performed: Procedure(s):  ESOPHAGOGASTRODUODENOSCOPY (EGD) USING BRAVO DEVICE      Final anesthesia type: Monitored Anesthesia Care    Patient location: PACU    Post anesthesia pain: adequate analgesia    Mental status: awake, alert  and oriented    Airway Patent: Yes    Last Vitals:   Vitals Value Taken Time   BP 103/69 02/01/21 1047   Temp 36.4 C 02/01/21 1025   Pulse 77 02/01/21 1051   Resp 18 02/01/21 1051   SpO2 92 % 02/01/21 1051   Vitals shown include unvalidated device data.     Post vital signs: stable    Hydration: adequate    N/V:no    Anesthetic complications: no    Plan of care per primary team.

## 2021-02-01 NOTE — Procedures (Signed)
[facility]  Gastroenterology/Special Procedures    Patient Name: Tiffany Chen  Date of Birth: 01-20-1945  Record Number: [id number]  Date of Procedure: 02/01/2021  Referring Physician:   Endoscopist: Anette Guarneri   Asst. Endoscopist:     Nurse: Kevin Fenton    PROCEDURE PERFORMED  EGD with biopsy and placement of wireless pH montioring device (BRAVOB    INDICATIONS FOR EXAMINATION   76 year old with a PMHx significant for pulmonary MAC, DMII, who now presents for EGD with pH Bravo for GERD symptoms     Instruments:    Medications: MAC Propofol                  The attending physician, Dr. Radene Knee, was present for the entire examination.  Procedure Technique: Patient's medications, allergies, past medical, surgical, social and family histories were reviewed and updated as appropriate. A discussion of informed consent was had with the patient and/or the patient's family prior to the   procedure, including sedation.  The alternatives, benefits and risks of the procedure including but not limited to pain, perforation, hemorrhage, infection, adverse drug reaction, missed lesion/incomplete procedure, tooth damage, and aspiration were   discussed    Description of Procedure:   Based on the pre-procedure assessment, including review of the patient's medical history, medications, allergies, and review of systems, the patient had been deemed to be an appropriate candidate for sedation; the patient was therefore sedated with the   medications listed. The patient was monitored continuously with pulse oximetry, blood pressure monitoring, and direct observations    The  was introduced and passed without difficulty to second part of duodenum. A careful inspection was made as the endoscope was withdrawn.  Findings and interventions are described below:  Extent of Exam: second part of duodenum.  Biopsy Obtained: Yes.  Complications: .   Estimated Blood Loss: Minimal.   Need for Future Anesthesia: Anesthesiology for future  procedures.    Total Procedure Time:     FINDINGS  EXNTZ001 scope with cap used. Normal appearing entire esophagus. GE junction 37cm from incisors. Mild gastropathy characterized by punctate heme, biopsied with cold forceps. Mild duodenal erosions in the bulb with reactive appearance. Otherwise normal   appearing duodenum. Scope withdrawn and wireless pH Bravo advanced in to the esophagus and deployed in standard fashion 31cm from the incisors. Scope reintroduced and bravo device was in excellent position.    ENDOSCOPIC DIAGNOSIS  1) Mild gastropathy and duodenopathy with reactive appearance. Gastric biopsies obtained with cold forceps.  2) Successful placement of pH Bravo device as described    RECOMMENDATIONS  1) Follow instructions for pH Bravo study  2) You can resume Omeprazole after the pH bravo study is completed    Signature:_________________________________ Anette Guarneri, MD              [phone])    Note:  The final official report is in the [facility] medical record.      This electronic signature authenticates all electronic and/or handwritten documentation, including orders, generated by the signer during the episode of care contained in this record.  02/01/2021 10:19:18 AM By Anette Guarneri MD

## 2021-02-02 LAB — RESPIRATORY CULTURE W/GRAM STAIN: Respiratory Culture Result: NORMAL

## 2021-02-05 ENCOUNTER — Encounter (INDEPENDENT_AMBULATORY_CARE_PROVIDER_SITE_OTHER): Payer: Self-pay | Admitting: Gastroenterology

## 2021-02-06 ENCOUNTER — Ambulatory Visit
Admission: RE | Admit: 2021-02-06 | Discharge: 2021-02-06 | Disposition: A | Discharge status: Home / Self Care | Payer: Medicare Other | Attending: Critical Care Medicine | Admitting: Critical Care Medicine

## 2021-02-06 DIAGNOSIS — A310 Pulmonary mycobacterial infection: Secondary | ICD-10-CM | POA: Insufficient documentation

## 2021-02-06 DIAGNOSIS — J984 Other disorders of lung: Secondary | ICD-10-CM | POA: Insufficient documentation

## 2021-02-06 NOTE — Interdisciplinary (Signed)
Pulmonary Functions: 10% saline admin via neb. Specimen obtained for analysis. No adverse reactions noted.

## 2021-02-09 LAB — RESPIRATORY CULTURE W/GRAM STAIN
Gram Stain Result: NONE SEEN
Respiratory Culture Result: NORMAL

## 2021-02-10 ENCOUNTER — Other Ambulatory Visit: Payer: Self-pay

## 2021-02-12 ENCOUNTER — Encounter (INDEPENDENT_AMBULATORY_CARE_PROVIDER_SITE_OTHER): Payer: Self-pay | Admitting: Gastroenterology

## 2021-02-12 ENCOUNTER — Ambulatory Visit
Admission: RE | Admit: 2021-02-12 | Discharge: 2021-02-12 | Disposition: A | Discharge status: Home / Self Care | Payer: Medicare Other | Attending: Critical Care Medicine | Admitting: Critical Care Medicine

## 2021-02-12 ENCOUNTER — Other Ambulatory Visit (HOSPITAL_BASED_OUTPATIENT_CLINIC_OR_DEPARTMENT_OTHER): Payer: Medicare Other

## 2021-02-12 DIAGNOSIS — J984 Other disorders of lung: Secondary | ICD-10-CM

## 2021-02-12 DIAGNOSIS — A310 Pulmonary mycobacterial infection: Secondary | ICD-10-CM

## 2021-02-12 LAB — COMPREHENSIVE METABOLIC PANEL, BLOOD
ALT (SGPT): 17 U/L (ref 0–33)
AST (SGOT): 20 U/L (ref 0–32)
Albumin: 4.3 g/dL (ref 3.5–5.2)
Alkaline Phos: 65 U/L (ref 40–130)
Anion Gap: 13 mmol/L (ref 7–15)
BUN: 12 mg/dL (ref 8–23)
Bicarbonate: 24 mmol/L (ref 22–29)
Bilirubin, Tot: 0.34 mg/dL (ref <1.2)
Calcium: 10.2 mg/dL (ref 8.5–10.6)
Chloride: 101 mmol/L (ref 98–107)
Creatinine: 0.6 mg/dL (ref 0.51–0.95)
Glucose: 92 mg/dL (ref 70–99)
Potassium: 4.4 mmol/L (ref 3.5–5.1)
Sodium: 138 mmol/L (ref 136–145)
Total Protein: 7.5 g/dL (ref 6.0–8.0)
eGFR Based on CKD-EPI 2021 Equation: >60 mL/min

## 2021-02-12 LAB — CBC WITH DIFF, BLOOD
ANC-Automated: 4.5 10*3/uL (ref 1.6–7.0)
Abs Basophils: 0.1 10*3/uL (ref <0.2)
Abs Eosinophils: 0.1 10*3/uL (ref 0.0–0.5)
Abs Lymphs: 1.7 10*3/uL (ref 0.8–3.1)
Abs Monos: 0.7 10*3/uL (ref 0.2–0.8)
Basophils: 1 %
Eosinophils: 2 %
Hct: 39.1 % (ref 34.0–45.0)
Hgb: 12.2 gm/dL (ref 11.2–15.7)
Lymphocytes: 24 %
MCH: 25.8 pg — ABNORMAL LOW (ref 26.0–32.0)
MCHC: 31.2 g/dL — ABNORMAL LOW (ref 32.0–36.0)
MCV: 82.8 um3 (ref 79.0–95.0)
MPV: 10.3 fL (ref 9.4–12.4)
Monocytes: 10 %
Plt Count: 284 10*3/uL (ref 140–370)
RBC: 4.72 10*6/uL (ref 3.90–5.20)
RDW: 17.3 % — ABNORMAL HIGH (ref 12.0–14.0)
Segs: 64 %
WBC: 7.1 10*3/uL (ref 4.0–10.0)

## 2021-02-12 NOTE — Interdisciplinary (Signed)
PFT lab note:    Sputum sample obtained via 10% hypertonic saline and sent for analysis. No adverse reactions.

## 2021-02-13 ENCOUNTER — Encounter (HOSPITAL_BASED_OUTPATIENT_CLINIC_OR_DEPARTMENT_OTHER): Payer: Self-pay | Admitting: Critical Care Medicine

## 2021-02-13 ENCOUNTER — Other Ambulatory Visit: Payer: Self-pay

## 2021-02-13 ENCOUNTER — Ambulatory Visit
Admission: RE | Admit: 2021-02-13 | Discharge: 2021-02-13 | Disposition: A | Discharge status: Home / Self Care | Payer: Medicare Other | Attending: Allergy | Admitting: Allergy

## 2021-02-13 VITALS — BP 121/62 | HR 76 | Temp 97.6°F | Resp 16 | Wt 132.5 lb

## 2021-02-13 DIAGNOSIS — D809 Immunodeficiency with predominantly antibody defects, unspecified: Secondary | ICD-10-CM | POA: Insufficient documentation

## 2021-02-13 LAB — AFB CULTURE W/STAIN: AFB Smear: POSITIVE — AB

## 2021-02-13 MED ORDER — CETIRIZINE HCL 10 MG OR TABS
10.0000 mg | ORAL_TABLET | Freq: Once | ORAL | Status: AC
Start: 2021-02-13 — End: 2021-02-13
  Administered 2021-02-13 (×2): 10 mg via ORAL
  Filled 2021-02-13: qty 1

## 2021-02-13 MED ORDER — DEXTROSE 5 % IV SOLN
INTRAVENOUS | Status: DC
Start: 2021-02-13 — End: 2021-02-14

## 2021-02-13 MED ORDER — ACETAMINOPHEN 325 MG PO TABS
650.0000 mg | ORAL_TABLET | Freq: Once | ORAL | Status: AC
Start: 2021-02-13 — End: 2021-02-13
  Administered 2021-02-13 (×2): 650 mg via ORAL
  Filled 2021-02-13: qty 2

## 2021-02-13 MED ORDER — IMMUNE GLOBULIN (HUMAN) 20 GM/200ML IJ SOLN
0.5000 g/kg | Freq: Once | INTRAMUSCULAR | Status: AC
Start: 2021-02-13 — End: 2021-02-13
  Administered 2021-02-13 (×2): 25 g via INTRAVENOUS
  Filled 2021-02-13: qty 200

## 2021-02-13 MED ORDER — DIPHENHYDRAMINE HCL 25 MG OR TABS OR CAPS CUSTOM
25.0000 mg | ORAL_CAPSULE | Freq: Once | ORAL | Status: DC | PRN
Start: 2021-02-13 — End: 2021-02-17

## 2021-02-13 NOTE — Interdisciplinary (Signed)
Non-Chemotherapy Infusion Nursing Note - [facility] Tiffany Chen is a 76 year old female who presents for infusion of IVIG 25 g    Vitals:    02/13/21 1229 02/13/21 1527   BP: 101/71 121/62   BP Location: Right arm Right arm   BP Patient Position: Sitting Sitting   Pulse: 79 76   Resp: 16 16   Temp: 97.1 F (36.2 C) 97.6 F (36.4 C)   TempSrc: Temporal Temporal   SpO2: 99% 99%   Weight: 60.1 kg (132 lb 7.9 oz)      Pain Score: 0  Body surface area is 1.63 meters squared.  Body mass index is 23.85 kg/m.    Medications   dextrose 5% infusion (0 mL IntraVENOUS Stopped 02/13/21 1522)   diphenhydrAMINE (BENADRYL) tablet 25 mg (has no administration in time range)   acetaminophen (TYLENOL) tablet 650 mg (650 mg Oral Given 02/13/21 1240)   cetirizine (ZYRTEC) tablet 10 mg (10 mg Oral Given 02/13/21 1240)   immune globulin (human) (IGG) 25 g in 250 mL (GAMUNEX-C) infusion (0 g IntraVENOUS Completed 02/13/21 1520)       Pre-treatment nursing assessment:  Eating and drinking okay, Denies nausea, vomiting, diarrhea, fevers, or chills, or shortness of breath.      Tiffany Chen tolerated treatment well.    Post blood return: Brisk  Post-Flush: destrose and Discontinued IV    Patient Education  Learner: Patient  Barriers to learning: No Barriers  Readiness to learn: Acceptance  Method: Explanation    Treatment Education: Information/teaching given to patient including: signs and symptoms of infection, bleeding, adverse reaction(s), symptom control, and when to notify MD.    Tiffany Chen Prevention Education: Instructed patient to call for assistance.    Pain Education: Patient instructed to contact nurse if pain should develop or if their current pain therapy becomes ineffective.    Response: Verbalizes understanding    Discharge Plan  Discharge instructions given to patient.  Requested-Future appointments given and reviewed with treatment plan.  Discharge Mode: Ambulatory  Discharge Time: 1525  Accompanied by:  Self  Discharged To: Home

## 2021-02-14 LAB — M. TUBERCULOSIS AND RIFAMPIN RESISTANCE PCR: M. tuberculosis PCR: NOT DETECTED

## 2021-02-14 NOTE — Progress Notes (Signed)
PULMONARY OUTPATIENT CLINIC FOLLOW-UP NOTE    Chief Complaint   Patient presents with   . Follow Up       History of Present Illness:  Tiffany Chen is a 76 year old female with history of bronchiectasis, cavitary MAC lung infection, ruptured diverticulitis s/p colostomy in 2009, DM, ICH s/p embolization. She is here to follow up on bronchiectasis and MAC lung infection.      Patient's last visit on 01/17/2021 with [name] to follow up on bronchiectasis and MAC lung infection. I saw Tiffany Chen the last on 12/07/2020 to follow up on bronchiectasis and MAC lung infection. She states doing well from a respiratory stand point since last visit. She denies ED visits or recent hospitalizations.       Tiffany Chen has history of MAC lung infection treated multiple times with azithromycin 52m, Rifampin 6042m, ethambutol 160028mhree times weekly: 2018- 01/19/2018, 05/25/2018- 10/22/2019 and 12/22/2019- 02/16/2020.     As per last note from Dr. [name] In late 2021, CT scan showed enlarging cavity. December 2021 a bronch was performed but no MAC growth. Her doctor restarted azithro/Rifampin/ethmabutol based on imaging.   Feb 2022 she saw [name] stopped the antibiotics based on the lack of microbiologic data. Performed repeat bronch 04/28/2020 which has smear AFB 3+ and culture MAC (drug susc pending).      During her visit from 07/25/2019 with Dr. ElmDebara Pickette was strongly recommended to start new round of MAC antibiotic therapy due to the severity of the lung cavitation and severity of her symptoms. The regimen recommended was IV antibiotic (Amikacin) home infusion for 3 months, azithromycin 250 mg daily, ethambutol 800 mg daily and rifampin 600 mg daily.      She started on MAC antibiotic therapy on 08/14/2020 with tentative plan to stop IV amikacin on 11/24/2020 and replaced with arikayce. Her IV was removed on 11/24/2020 and also discussed the possibility of right upper lobe  lobectomy.  She has been tolerating well MAC antibiotics.    11/23/2020 she started on Arikayce nebs every day. She reports mild sore throat with Arykace but was able to tolerate with hot tea.       During her last visit with Dr. ElmColin Inaom 01/17/2021 she was added clofazimine to her regimen.  In addition to azithro/rifampin/ethambutol and Arikayce. She also was referred to Dr. OnaBerniece Andreasr consideration for right upper and middle lobectomy. She is schedule for RUL lobectomy on 03/20/2021/     While on therapy she was advised to have lab work twice a week while on IV antibiotic and then after every month, continue with Audiogram every 2 weeks, submit sputum samples x 3 every month, EKG every 3-4 months and continue with ishihara discrimination color test.      Today, Tiffany Chen feeling well at her baseline. She reports symptom improvement with current MAC antibiotic therapy. She has not been started on Clofazimine.  She continues with improved cough and now intermittent. She reports not able to produce phlegm.  In general she has noticed improved cough ever since started on MAC antibiotic. She has been taking MAC antibiotics religiously. She has been doing air way clearance once a day with saline solution twice a day.She denies wheezing or dyspnea. She denies hemoptysis, fevers, chills, nausea, vomiting or night sweats.  She is currently capable to walk more several miles without feeling winded. She denies feeling winded when going uphill or on stairs.  She  denies chest congestion, orthopnea, PND lower extremity edema. She reports history of palpitations. She denies allergic rhinitis, sinus problems or other URI. She has history of GERD controlled with diet and PPI. She have had influenza vaccine on fall 2022 and competed COVID vaccines.     Lab work from 02/12/2021 within normal limits( thrombocytopenia, kidney or liver toxicity). EKG from 01/23/2021 with sinus rhythm. She have had audiogram with stable  hearing on 11/13/2020 and was recommended to switch to audiogram every 3 month. She is due for new Audiogram on 03/2021    Review of Systems:   A full  review of systems was performed and the pertinent positives and negatives were mentioned in the HPI, all other review of systems were negative.     Past Medical History:   Diagnosis Date   . Cavitary lesion of lung 07/24/2020   . DM (diabetes mellitus) (CMS-HCC)    . 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)        Past Surgical History:   Procedure Laterality Date   . BRONCHOSCOPY  12/2020       No family history on file.    Social History     Socioeconomic History   . Marital status: Widowed   Tobacco Use   . Smoking status: Never   . Smokeless tobacco: Never   Substance and Sexual Activity   . Alcohol use: Not Currently   . Drug use: Not Currently       Allergies   Allergen Reactions   . [employer]  Other       Current Outpatient Medications on File Prior to Visit   Medication Sig Dispense Refill   . albuterol 108 (90 Base) MCG/ACT inhaler Inhale 2 puffs by mouth every 6 hours as needed for Wheezing. 3 each 3   . Amikacin Sulfate Liposome (ARIKAYCE) 590 MG/8.4ML SUSP 590 mg by Nebulization route daily. 252 mL 11   . atorvastatin (LIPITOR) 10 MG tablet Take 1 tablet (10 mg) by mouth daily.     [name] azithromycin (ZITHROMAX) 250 MG tablet Take 1 tablet (250 mg) by mouth daily. 30 tablet 11   . CLOFAZIMINE IRB [id number] 50 MG CAPSULE Take 2 capsules (13m) by mouth once daily with food as directed. 200 capsule 0   . ethambutol (MYAMBUTOL) 400 MG tablet Take 2 tablets (800 mg) by mouth daily. 60 tablet 11   . ferrous sulfate 325 (65 Fe) MG tablet Take 1 tablet (325 mg) by mouth 3 times daily.     .Marland KitchenFLUOXETINE HCL PO 30 mg     . metFORMIN (GLUCOPHAGE) 500 mg tablet Take 1 tablet (500 mg) by mouth 2 times daily (with meals).     .Marland Kitchenomeprazole (PRILOSEC) 20 MG capsule Take 1 capsule (20 mg) by mouth every morning (before breakfast). 90 capsule 1    . Pediatric Multivitamins-Fl (MULTIVITAMINS/FL PO)      . ramipril (ALTACE) 2.5 MG capsule Take 1 capsule (2.5 mg) by mouth daily.     . rifampin (RIFADIN) 300 MG capsule Take 2 capsules (600 mg) by mouth daily. 60 capsule 11   . sodium chloride 7 % NEBU 4 mL by Nebulization route every 12 hours. 240 mL 11     No current facility-administered medications on file prior to visit.       Physical Examination:  BP 130/76 (BP Location: Right arm, BP Patient Position: Sitting, BP cuff size: Regular)   Pulse 87   Temp 98 F (36.7  C) (Temporal)   Resp 18   Ht 5' 2.5" (1.588 m)   Wt 60.2 kg (132 lb 12.8 oz)   SpO2 98%   BMI 23.90 kg/m    Oxygen Therapy  SpO2: 98 %       General: well appearing alert and oriented x 3, in nad.  Oropharynx: without any lesions  Heart:  Regular rate and rhythm, normal S1 S2, no murmurs.  Lungs: clear to auscultation, right upper lobe wheezing, rales, rhonchi, no chest deformities noted. Normal I:E ratio  Extremities:  no cyanosis, clubbing, or edema.  Skin:  No rashes or lesions.  Psych: normal affect and mood       I have reviewed the following  laboratory data and other diagnostic studies:   CBC:  Lab Results   Component Value Date    WBC 7.1 02/12/2021    HGB 12.2 02/12/2021    HCT 39.1 02/12/2021    PLT 284 02/12/2021     CHEM:  Lab Results   Component Value Date    NA 138 02/12/2021    K 4.4 02/12/2021    CL 101 02/12/2021    BICARB 24 02/12/2021    BUN 12 02/12/2021    CREAT 0.60 02/12/2021    GLU 92 02/12/2021    Cornelius 10.2 02/12/2021     COAG:  No results found for: PT, PTT, INR  LFTs:  Lab Results   Component Value Date    AST 20 02/12/2021    ALT 17 02/12/2021    ALK 65 02/12/2021    TP 7.5 02/12/2021    ALB 4.3 02/12/2021    TBILI 0.34 02/12/2021    DBILI <0.2 07/17/2020          Review of Radiology Studies:    Pulmonary Function Test Review:  04/28/2020 (care everywhere): noraml spito, normal volumes, mildly reduced DLCO    Radiology Review:    Chest x ray  (12/25/2020):  FINDINGS:  No pleural effusion or pneumothorax demonstrated.    Right upper lobe cavitary lesions.    Additional small nodularity better seen on the comparison chest CT.    Unremarkable cardiac silhouette.    Slight curvature of the descending thoracic aorta.    No acute osseous abnormality identified.    IMPRESSION:  No postprocedural complication identified.    Ct chest (01/19/2021):  FINDINGS:  Lines and Tubes: None    Thyroid and thoracic inlet: Normal.    Lymph nodes: 11 mm AP window lymph node measured 7 mm on the prior study. Pre-vascular 7 mm lymph node measured 4 mm on the prior study. Subcarinal lymphadenopathy may be slightly increased.    Cardiovascular: Atherosclerotic disease.    Pericardium: Small amount of pericardial fluid is unchanged    Esophagus: Normal.    Lung: 3.4 cm cavitary mass in the posterior segment of the right lower lobe measured 3 cm on the prior study. Increasing ground-glass opacity throughout the anterior and apical segments of the right upper lobe. Increasing consolidation at the right apex. Increasing nodularity and bronchial wall thickening in the right upper lobe. Wall thickness of a cavity in the right upper lobe on series 4, image 334 measured 9 mm. This measured 3 mm on the prior study. Increasing ground-glass opacity in the lower lobes. Increasing tree-in-bud nodularity. Previously seen nodularity persists.    Pleura: Normal.    Abdomen: Visualized superior abdomen demonstrates hepatic hypodensity which is unchanged    Bone and soft tissue: No acute abnormality.  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 Additionally, ground-glass opacity and nodularity has increased.    CT chest (11/09/2020):  FINDINGS:  Lines and Tubes: PICC ends in the left brachiocephalic vein    Thyroid and thoracic inlet: Normal.  Lymph nodes:  Lymph nodes have increased in size. 8 mm AP window lymph node measured 4 mm on the prior study. 8 mm lymph node adjacent to the superior vena cava measured 6 mm on the prior study. Other lymph nodes are unchanged.  Cardiovascular: Atherosclerotic disease is relatively mild for the patient's age.  Pericardium: Small pericardial effusion  Esophagus: Normal.    Lung: Comparison to the outside study is limited due to poor spatial resolution on that study given 5 mm slice thickness versus 7.782 mm slice thickness. New thick walled cavitary 2.6 cm right apical nodule on series 2, image 393 adjacent 12 mm thick walled cavitary nodule is new. Worsening tree-in-bud nodularity and bronchiectasis in the right upper lobe. Thick walled cavity in the posterior segment of the right upper lobe measuring 3.1 cm is unchanged in size but demonstrates increased wall thickness. Surrounding tree-in-bud nodularity is increased. Cavitary nodule in the right middle lobe is unchanged. Bronchiectasis may be increased but is again difficult to assess given differences in slice thickness. Worsening tree-in-bud nodularity throughout the right middle and right lower lobes. Worsening nodular consolidation in the medial aspect of the right middle lobe. New areas of nodularity throughout the left lung.  Pleura: Normal.    Abdomen: 1.2 cm hypoattenuating nodule in the liver. This is unchanged    Bone and soft tissue: No acute abnormality.    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    CT chest 04/13/2020:   CONCLUSION:   1. 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   2. Cavitary lesion in the periphery of the right lung has increased in size measuring 2.7 cm, although may have a slightly less thickened and nodular wall.   3. Diffuse bronchiectasis most  pronounced in the right upper lobe.     Microbiology  BAL 04/28/2020 (from Dr. Cecile Hearing): AFB smear 3+, culture MAC (drug susceptibly testing sent to [facility]- pending)      6 MIN WALK (01/30/2021):      PFTs ( 02/16/2021):      ASSESSMENT AND PLAN:  Tiffany Chen is a 76 year old female with history of bronchiectasis, cavitary MAC lung infection, ruptured diverticulitis s/p colostomy in 2009, DM, ICH s/p embolization. She is here to follow up on bronchiectasis and MAC lung infection.     # Bronchiectasis/ Cavitary MAC lung infection: currently stable with mild symptoms. She was advised to start MAC antibiotic therapy due to the severity of her lesions and symptoms. She started on  IV antibiotic Amikacin home infusion for 3 months (08/14/2020 to 11/24/2020), azithromycin 250 mg daily, ethambutol 800 mg daily and rifampin 600 mg daily.  She was started on inhaled Arikayce on 11/23/2020. More recently added Clofazimine on 1/11/20223. She reports improved condition with current MAC antibiotic therapy. She denies mayor side effects and seems tolerating well antibiotic therapy. PFT from 04/28/2020 ( outside) wnl, mildly reduced DLCO. PFT from 02/16/2021 with ( FVC 2.00- 79%, FEV 1 1.60 - 85%, FEV1/FVC 80, DLCOc 80%).  CT Chest from  04/13/2020 with micronodular and tree in bud opacity consistent with MAC infection. Cavitary lesion to RUL about 2.7 cm. CT chest from 11/09/2020 with worsening of nontuberculous mycobacterial disease infection involving the right lung (new thick walled cavitary 2.6 cm right apical nodule on series 2, image 393 adjacent 12 mm thick walled cavitary nodule is new. Worsening tree-in-bud nodularity and bronchiectasis in the right upper lobe). Ct chest from 01/19/2021 with worsenig cavitary nontuberculous mycobacterial disease has worsened with increasing size of cavitary lesions with associated increasing wall thickness. Sputum cultures from 02/2021 x 2 and 01/2021 with preliminary  negative result. She has been evaluated by Dr. Berniece Andreas and will have right Robotic upper lobectomy with middle lobe wedge resection on 03/20/2021. I have discussed air way clearance and MAC antibiotic therapy. I have recommended her to continue with air way clearance once a day with saline nebulizer solution followed by acapella device ( device given during the visit). Patient was instructed to use albuterol inhaler 2 puffs before air way clearance. I have recommended her continue with Arikayce daily, azithromycin 250 mg daily, ethambutol 800 mg daily and rifampin 600 mg daily. I have recommended her to start on Clofazimine.  I have reminded her to continue with lab work every month, sputum cultures for AFB and respiratory cultures x 3 per month, EKG and Audiogram every 3-4 months  and to continue with ishihara discrimination color chart. Patient was advised to monitor for respiratory symptoms and call back if any changes.  ED precautions given.     # Immunizations: up to date.     "I personally spend a total of 60 Minutes in face to face and non face to face activities related to patient's visit today, excluding and separately reportable services/procedures."    Follow up with [name] in 2 months.     Zella Richer NP-BC  Pulmonary Medicine

## 2021-02-14 NOTE — Telephone Encounter (Signed)
Beaulah Corin, RN  [name] 4 hours ago (12:27 PM)     Hello-let's touch base Thursday for the pick up of the Clofazimine. I cannot leave at the front desk .    This MyChart message has not been read.     Beaulah Corin, RN  [name] 5 hours ago (11:18 AM)     Good Morning-After clinic this morning I will pick up the Clofazimine at our pharmacy. Are you able to pick it up at the front desk?    This MyChart message has not been read.     Ilda Basset 7 hours ago (9:26 AM)     JB  Yes it comes directly from Manufacturer to our office, I believe it has arrived so Candy will Fedex it to you or you can pick up from clinic. I will let her get back you with the details.        [place]  [facility] My[facility] (supporting Katheren Shams, MD) 7 hours ago (9:21 AM)     Does it go to Doctors office?? When did the two week count start, so I can plan a follow up with you on calendar. Thank you!       You  Olevia Perches, Clarita Leber, Cira Servant, RN Yesterday (4:20 PM)     Salley Scarlet Candy it arrived on 1/26.     See e-mail from Vladimir Creeks on 02/01/2021.      Beaulah Corin, RN  Charlisa Sharon Seller Yesterday (9:05 AM)     Good Morning,  Clofazimine comes from the FDA. It takes up to 2 weeks to receive. I will contact you once it arrives.        Caesar Bookman routed conversation to You; Beaulah Corin, RN Yesterday (9:00 AM)     [facility] My[facility] (supporting Katheren Shams, MD) Yesterday (8:31 AM)     This medicine is on my prescription list.  Was it sent to my [facility] pharmacy?  I have not received this medication nor heard anything since Doctor said he was going to have me start a new med for my MAC.  Please advise

## 2021-02-15 ENCOUNTER — Ambulatory Visit
Admission: RE | Admit: 2021-02-15 | Discharge: 2021-02-15 | Disposition: A | Discharge status: Home / Self Care | Payer: Medicare Other | Attending: Critical Care Medicine | Admitting: Critical Care Medicine

## 2021-02-15 DIAGNOSIS — J984 Other disorders of lung: Secondary | ICD-10-CM | POA: Insufficient documentation

## 2021-02-15 DIAGNOSIS — A310 Pulmonary mycobacterial infection: Secondary | ICD-10-CM | POA: Insufficient documentation

## 2021-02-15 LAB — RESPIRATORY CULTURE W/GRAM STAIN: Respiratory Culture Result: NORMAL

## 2021-02-15 NOTE — Interdisciplinary (Addendum)
PFT lab note: sputum induction performed using 10% hypertonic saline. Minimal amount of sample produced. Sample taken to lab.

## 2021-02-16 ENCOUNTER — Encounter (HOSPITAL_BASED_OUTPATIENT_CLINIC_OR_DEPARTMENT_OTHER): Payer: Self-pay | Admitting: Critical Care Medicine

## 2021-02-16 ENCOUNTER — Ambulatory Visit
Admission: RE | Admit: 2021-02-16 | Discharge: 2021-02-16 | Disposition: A | Discharge status: Home / Self Care | Payer: Medicare Other | Attending: Critical Care Medicine | Admitting: Critical Care Medicine

## 2021-02-16 ENCOUNTER — Other Ambulatory Visit: Payer: Self-pay

## 2021-02-16 DIAGNOSIS — J984 Other disorders of lung: Secondary | ICD-10-CM

## 2021-02-16 DIAGNOSIS — A310 Pulmonary mycobacterial infection: Secondary | ICD-10-CM | POA: Insufficient documentation

## 2021-02-16 NOTE — Telephone Encounter (Signed)
You  [name] Just now (9:56 AM)     The RDW is not concerning at all, especially since you do not have anemia. All your lab values are great. Liver, kidneys, blood counts, salt concentrations in the blood.     Every thing is great.     [name], MD    This MyChart message has not been read.     Altha Harm, RN  Tiffany Chen 37 minutes ago (9:19 AM)     Good mnorning  RDW means that there's variation in the size of your red blood cells beyond what's considered normal. A high RDW may be a sign of anemia or a related condition. You elevation is only 3 points above normal    This MyChart message has not been read.     Tiffany Chen  [facility] My[facility] (supporting [name], Ezzard Standing, MD) 1 hour ago (8:21 AM)     What isRDW and what does the reading mean?  The total number has gone from 60 to 64. What does this mean?  Thank you.

## 2021-02-16 NOTE — Procedures (Signed)
No note

## 2021-02-18 LAB — RESPIRATORY CULTURE W/GRAM STAIN: Respiratory Culture Result: NORMAL

## 2021-02-19 ENCOUNTER — Encounter (HOSPITAL_BASED_OUTPATIENT_CLINIC_OR_DEPARTMENT_OTHER): Payer: Self-pay | Admitting: Pharmacist

## 2021-02-19 ENCOUNTER — Encounter (INDEPENDENT_AMBULATORY_CARE_PROVIDER_SITE_OTHER): Payer: Self-pay

## 2021-02-20 ENCOUNTER — Ambulatory Visit: Payer: Medicare Other | Attending: Nurse Practitioner | Admitting: Nurse Practitioner

## 2021-02-20 ENCOUNTER — Encounter (HOSPITAL_BASED_OUTPATIENT_CLINIC_OR_DEPARTMENT_OTHER): Payer: Self-pay | Admitting: Nurse Practitioner

## 2021-02-20 VITALS — BP 130/76 | HR 87 | Temp 98.0°F | Resp 18 | Ht 62.5 in | Wt 132.8 lb

## 2021-02-20 DIAGNOSIS — J479 Bronchiectasis, uncomplicated: Secondary | ICD-10-CM | POA: Insufficient documentation

## 2021-02-20 DIAGNOSIS — J471 Bronchiectasis with (acute) exacerbation: Secondary | ICD-10-CM | POA: Insufficient documentation

## 2021-02-20 DIAGNOSIS — A319 Mycobacterial infection, unspecified: Secondary | ICD-10-CM | POA: Insufficient documentation

## 2021-02-20 DIAGNOSIS — J984 Other disorders of lung: Secondary | ICD-10-CM | POA: Insufficient documentation

## 2021-02-20 NOTE — Patient Instructions (Signed)
-   Please continue with air way clearance 2 times a day ( albuterol solution followed by saline solution and Aerobika/ acapella device).  - Continue with your inhalers as prescribed.  -Please continue with MAC antibiotic therapy as instructed.   - Please submit sputum samples for AFB and respiratory culture x 3 every 4-6 weeks.  - Continue with lab work (CBC, CMP) every month.  - Continue with Audiogram every 3-4 months ( due)  - EKG every 3-4 months (due)  - Perform Ishihara discrimination color chart 3 times a week minimum.  - Monitor for acute respiratory symptoms and provide with a call back if worsen.   - Follow up with [name] in 3-4 months

## 2021-03-06 ENCOUNTER — Encounter (HOSPITAL_COMMUNITY): Payer: Self-pay | Admitting: Thoracic Surgery

## 2021-03-06 ENCOUNTER — Encounter (HOSPITAL_COMMUNITY): Payer: Self-pay | Admitting: Hospital

## 2021-03-08 ENCOUNTER — Other Ambulatory Visit: Payer: Self-pay

## 2021-03-09 ENCOUNTER — Ambulatory Visit
Admission: RE | Admit: 2021-03-09 | Discharge: 2021-03-12 | Disposition: A | Discharge status: Home / Self Care | Payer: Medicare Other | Attending: Audiology | Admitting: Audiology

## 2021-03-09 DIAGNOSIS — H903 Sensorineural hearing loss, bilateral: Secondary | ICD-10-CM | POA: Insufficient documentation

## 2021-03-09 DIAGNOSIS — H9313 Tinnitus, bilateral: Secondary | ICD-10-CM | POA: Insufficient documentation

## 2021-03-09 NOTE — Progress Notes (Signed)
AUDIOGRAM  HISTORY  Patient was seen today for an audiologic evaluation referred by [name], Estill Batten. Patient seen today for ototoxic monitoring as she is currently on medications for MAC lung infection. Patient has stopped IV amikacin but is still on other medications. Patient denied changes in hearing sensitivity, otalgia, otorrhea, aural pressure and dizziness. She has bilateral intermittent tinnitus which she reported she had prior to starting the medications for MAC lung infection. She denied worsening tinnitus.     OTOSCOPY:  Otoscopy revealed clear canals bilaterally.    AUDIOLOGIC TESTING  Pure-tone results for the RIGHT side revealed normal hearing sensitivity from [phone] sloping to a moderate a sensorineural hearing loss.  Pure-tone results for the LEFT side revealed normal hearing sensitivity from [phone] sloping to a moderate sensorineural hearing loss.    Word recognition scores (WRS) were excellent(100%) for the right ear and excellent (100%) for the left ear.  Ultra-High frequency audiometry measured in dB HL  Ear 9,000 Hz 10,000 Hz 11,200 Hz 12,500 Hz 14,000 Hz 16,000 Hz   Right 60 65 70 65 75 NR   Left 65 70 75 75 75 NR     *Extra time was spent performing additional testing to measure ultra high-frequency thresholds as part of patient's ototoxic monitoring.      SUMMARY  Results indicated stable thresholds and word recognition compared to audiogram from 11/13/2020. Results were reviewed with patient.  Recommend repeat audiogram in 3 months or sooner if patient notices changes in hearing sensitivity/otologic symptoms. Patient also asked to contact [name] if she has changes in otologic symptoms.      Audiogram viewable in EPIC "Media" tab.    Larrie Kass, Au.D., CCC-A  Senior Audiologist [facility] [phone]

## 2021-03-12 ENCOUNTER — Other Ambulatory Visit: Payer: Self-pay

## 2021-03-13 ENCOUNTER — Ambulatory Visit
Admission: RE | Admit: 2021-03-13 | Discharge: 2021-03-13 | Disposition: A | Discharge status: Home / Self Care | Payer: Medicare Other | Attending: Allergy | Admitting: Allergy

## 2021-03-13 VITALS — BP 129/67 | HR 70 | Temp 98.2°F | Resp 16 | Ht 62.5 in | Wt 137.1 lb

## 2021-03-13 DIAGNOSIS — D809 Immunodeficiency with predominantly antibody defects, unspecified: Secondary | ICD-10-CM | POA: Insufficient documentation

## 2021-03-13 MED ORDER — IMMUNE GLOBULIN (HUMAN) 20 GM/200ML IJ SOLN
0.5000 g/kg | Freq: Once | INTRAMUSCULAR | Status: AC
Start: 2021-03-13 — End: 2021-03-13
  Administered 2021-03-13 (×2): 25 g via INTRAVENOUS
  Filled 2021-03-13: qty 200

## 2021-03-13 MED ORDER — CETIRIZINE HCL 10 MG OR TABS
10.0000 mg | ORAL_TABLET | Freq: Once | ORAL | Status: AC
Start: 2021-03-13 — End: 2021-03-13
  Administered 2021-03-13 (×2): 10 mg via ORAL
  Filled 2021-03-13: qty 1

## 2021-03-13 MED ORDER — DIPHENHYDRAMINE HCL 25 MG OR TABS OR CAPS CUSTOM
25.0000 mg | ORAL_CAPSULE | Freq: Once | ORAL | Status: DC | PRN
Start: 2021-03-13 — End: 2021-03-17

## 2021-03-13 MED ORDER — DEXTROSE 5 % IV SOLN
INTRAVENOUS | Status: DC
Start: 2021-03-13 — End: 2021-03-14

## 2021-03-13 MED ORDER — ACETAMINOPHEN 325 MG PO TABS
650.0000 mg | ORAL_TABLET | Freq: Once | ORAL | Status: AC
Start: 2021-03-13 — End: 2021-03-13
  Administered 2021-03-13 (×2): 650 mg via ORAL
  Filled 2021-03-13: qty 2

## 2021-03-13 NOTE — Interdisciplinary (Signed)
Non-Chemotherapy Infusion Nursing Note -  [name] is a 76 year old female who presents for infusion of IVIG.    Vitals:    03/13/21 1341 03/13/21 1346 03/13/21 1555 03/13/21 1620   BP:  121/78 126/67 129/67   BP Location:  Left arm Left arm Left arm   BP Patient Position:  Sitting Semi-Fowlers Semi-Fowlers   Pulse:  89 70 70   Resp:  16 16 16    Temp:  98.2 F (36.8 C) 98.3 F (36.8 C) 98.2 F (36.8 C)   TempSrc:  Temporal Temporal Temporal   SpO2:  99%  96%   Weight: 62.2 kg (137 lb 2 oz) 62.2 kg (137 lb 2 oz)     Height:  5' 2.5" (1.588 m)         Pre-treatment nursing assessment:  No problems identified upon assessment.  Patient arrived ambulatory in stable condition.   Denies pain.   Denies nausea or vomiting.   Denies diarrhea or constipation.   Denies fevers or recent illness.   Pt verbalizes tolerance with medication.    Medications   dextrose 5% infusion (0 mL IntraVENOUS Stopped 03/13/21 1626)   diphenhydrAMINE (BENADRYL) tablet 25 mg (25 mg Oral Not given 03/13/21 1346)   acetaminophen (TYLENOL) tablet 650 mg (650 mg Oral Given 03/13/21 1346)   cetirizine (ZYRTEC) tablet 10 mg (10 mg Oral Given 03/13/21 1346)   immune globulin (human) (IGG) 25 g in 250 mL (GAMUNEX-C) infusion (0 g IntraVENOUS Completed 03/13/21 1624)       Tiffany Chen tolerated treatment well.    Post blood return: Brisk  Post-Flush: NS    Patient Education  Learner: Patient  Barriers to learning: No Barriers  Readiness to learn: Acceptance  Method: Explanation    Treatment Education: Information/teaching given to patient including: signs and symptoms of infection, bleeding, adverse reaction(s), symptom control, and when to notify MD.    [name] Prevention Education: Instructed patient to call for assistance.    Pain Education: Patient instructed to contact nurse if pain should develop or if their current pain therapy becomes ineffective.    Response: Verbalizes understanding    Discharge Plan  Discharge  instructions given to patient.  Future appointments given and reviewed with treatment plan.  Discharge Mode: Ambulatory  Discharge Time: 1628  Accompanied by: Self  Discharged To: Home

## 2021-03-19 ENCOUNTER — Other Ambulatory Visit (HOSPITAL_BASED_OUTPATIENT_CLINIC_OR_DEPARTMENT_OTHER): Payer: Medicare Other

## 2021-03-19 ENCOUNTER — Ambulatory Visit (INDEPENDENT_AMBULATORY_CARE_PROVIDER_SITE_OTHER): Payer: Medicare Other | Admitting: Allergy

## 2021-03-19 VITALS — BP 125/79 | HR 85 | Temp 97.5°F

## 2021-03-19 DIAGNOSIS — A310 Pulmonary mycobacterial infection: Secondary | ICD-10-CM | POA: Insufficient documentation

## 2021-03-19 DIAGNOSIS — D809 Immunodeficiency with predominantly antibody defects, unspecified: Secondary | ICD-10-CM

## 2021-03-19 DIAGNOSIS — J479 Bronchiectasis, uncomplicated: Secondary | ICD-10-CM

## 2021-03-19 LAB — CBC WITH DIFF, BLOOD
ANC-Automated: 4 10*3/uL (ref 1.6–7.0)
Abs Basophils: 0.1 10*3/uL (ref <0.2)
Abs Eosinophils: 0.1 10*3/uL (ref 0.0–0.5)
Abs Lymphs: 1.5 10*3/uL (ref 0.8–3.1)
Abs Monos: 0.8 10*3/uL (ref 0.2–0.8)
Basophils: 1 %
Eosinophils: 2 %
Hct: 40.6 % (ref 34.0–45.0)
Hgb: 12.7 gm/dL (ref 11.2–15.7)
Lymphocytes: 23 %
MCH: 26.2 pg (ref 26.0–32.0)
MCHC: 31.3 g/dL — ABNORMAL LOW (ref 32.0–36.0)
MCV: 83.9 um3 (ref 79.0–95.0)
MPV: 10.6 fL (ref 9.4–12.4)
Monocytes: 13 %
Plt Count: 264 10*3/uL (ref 140–370)
RBC: 4.84 10*6/uL (ref 3.90–5.20)
RDW: 17 % — ABNORMAL HIGH (ref 12.0–14.0)
Segs: 61 %
WBC: 6.4 10*3/uL (ref 4.0–10.0)

## 2021-03-19 LAB — TYPE & SCREEN
ABO/RH: A NEG
Antibody Screen: NEGATIVE

## 2021-03-19 LAB — COMPREHENSIVE METABOLIC PANEL, BLOOD
ALT (SGPT): 20 U/L (ref 0–33)
AST (SGOT): 29 U/L (ref 0–32)
Albumin: 4.2 g/dL (ref 3.5–5.2)
Alkaline Phos: 63 U/L (ref 40–130)
Anion Gap: 13 mmol/L (ref 7–15)
BUN: 18 mg/dL (ref 8–23)
Bicarbonate: 23 mmol/L (ref 22–29)
Bilirubin, Tot: 0.27 mg/dL (ref <1.2)
Calcium: 9.9 mg/dL (ref 8.5–10.6)
Chloride: 100 mmol/L (ref 98–107)
Creatinine: 0.63 mg/dL (ref 0.51–0.95)
Glucose: 135 mg/dL — ABNORMAL HIGH (ref 70–99)
Potassium: 4.4 mmol/L (ref 3.5–5.1)
Sodium: 136 mmol/L (ref 136–145)
Total Protein: 7.6 g/dL (ref 6.0–8.0)
eGFR Based on CKD-EPI 2021 Equation: >60 mL/min/{1.73_m2}

## 2021-03-19 NOTE — Patient Instructions (Addendum)
After visit summary:    Continue IVIG at [facility] monthly     Follow-up in 4 months

## 2021-03-19 NOTE — Anesthesia Preprocedure Evaluation (Addendum)
ANESTHESIA PRE-OPERATIVE EVALUATION    Patient Information    Name: Tiffany Chen    MRN: [id number]    DOB: [birth year]    Age: 76 year old    Sex: female  Procedure(s):  GI EGD BRAVO      Pre-op Vitals:   There were no vitals taken for this visit.        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 Berniece Andreas. PMH of GERD, bronchiectasis, cavitary MAC lung infection, ruptured diverticulitis S/P colostomy in 2009, diabetes, ICH S/P embolization.     She has a history of MAC lung infection treated multiple times with azithromycin 557m, Rifampin 6063m, ethambutol 160073mhree times weekly: 2018- 01/19/2018, 05/25/2018- 10/22/2019 and 12/22/2019- 02/16/2020. >4 METS,     General:  weight loss >10% of BW in last 6 months,  able to climb flight of stairs/Exercise tolaerance >4 mets (1 FOS, 2 blocks ok. Lives on second floor and walks hilly terrain. Sometimes need to stop and rest due to breathing.),   Cardiovascular:  Pt denies chest pain, shortness of breath, palpitations, doe, leg swelling, syncope. Denies hx of cardiac problems.    EKG   08/23/2020: sinus rhythm with PAC   Anesthesia History:  no history of anesthetic complications,  no family history of anesthetic complications,   Pulmonary:   no home oxygen use,  no sleep apnea,  no recent URI/pneumonia,  History of MAC lung infection treated multiple times with azithromycin 500m36mifampin 600mg13mthambutol 1600mg 71me times weekly: 2018- 01/19/2018, 05/25/2018- 10/22/2019 and 12/22/2019- 02/16/2020. Currently resumed all 3 medications.    Monthly IVIG infusions started 01/16/21 to help with MAC infection tx    Has chronic mild cough that is stable. Occasional dyspnea with moderate exertion -stable    Changed diet, dropped weight, sleeping propped up has improved her symptoms.      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                      Labs in Epic  Hectorpoints of identification verification completed   Reviewed AVS  Sent AVS to                Physical Exam    Airway:    Inter-inciser distance 3-4 cm  Prognanth Able    Mallampati: II  Neck ROM: full  TM distance: 4-5 cm  Short thick neck: No          Cardiovascular:  - cardiovascular exam normal         Pulmonary:  -  pulmonary exam normal           Neuro/Neck/Skeletal/Skin:  - Neck/Neuro/Skeletal/Skin exam normal          Dental:  - normal exam    Abdominal:   - normal exam         Additional Clinical Notes:               Last  OSA (STOP BANG) Score:  No data recorded    Last OSA  (STOP) Score for   No data recorded                 Past Medical History:   Diagnosis Date    Cavitary lesion of lung 07/24/2020    DM (diabetes mellitus) (CMS-HCC)     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)      Past Surgical History:   Procedure Laterality Date    BRONCHOSCOPY  12/2020     Social History     Socioeconomic History    Marital status: Widowed   Tobacco Use    Smoking status: Never    Smokeless tobacco: Never   Substance and Sexual Activity    Alcohol use: Not Currently    Drug use: Not Currently     Alcohol Use: Not on file       Current Outpatient  Medications   Medication Sig Dispense Refill    albuterol 108 (90 Base) MCG/ACT inhaler Inhale 2 puffs by mouth every 6 hours as needed for Wheezing. 3 each 3    Amikacin Sulfate Liposome (ARIKAYCE) 590 MG/8.4ML SUSP 590 mg by Nebulization route daily. 252 mL 11    atorvastatin (LIPITOR) 10 MG tablet Take 1 tablet (10 mg) by mouth daily.      azithromycin (ZITHROMAX) 250 MG tablet Take 1 tablet (250 mg) by mouth daily. 30 tablet 11    CLOFAZIMINE IRB 21-0063 50 MG CAPSULE Take 2 capsules (144m) by mouth once daily with food as directed. 200 capsule 0    ethambutol (MYAMBUTOL) 400 MG tablet Take 2 tablets (800 mg) by mouth daily. 60 tablet 11    ferrous sulfate 325 (65 Fe) MG tablet Take 1 tablet (325 mg) by mouth 3 times daily.      FLUOXETINE HCL PO 30 mg      metFORMIN (GLUCOPHAGE) 500 mg tablet Take 1 tablet (500 mg) by mouth 2 times daily (with meals).      omeprazole (PRILOSEC) 20 MG capsule Take 1 capsule (20 mg) by mouth every morning (before breakfast). 90 capsule 1    Pediatric Multivitamins-Fl (MULTIVITAMINS/FL PO)       ramipril (ALTACE) 2.5 MG capsule Take 1 capsule (2.5 mg) by mouth daily.      rifampin (RIFADIN) 300 MG capsule Take 2 capsules (600 mg) by mouth daily. 60 capsule 11    sodium chloride 7 % NEBU 4 mL by Nebulization route every 12 hours. 240 mL 11     No current facility-administered medications for this visit.     Allergies   Allergen Reactions    Black Walnut Flavor Other       Labs and Other Data  Lab Results   Component Value Date    NA 136 03/19/2021    K 4.4 03/19/2021    CL 100 03/19/2021    BICARB 23 03/19/2021    BUN 18 03/19/2021    CREAT 0.63 03/19/2021    GLU 135 (H) 03/19/2021    [place] 9.9 03/19/2021  Lab Results   Component Value Date    AST 29 03/19/2021    ALT 20 03/19/2021    ALK 63 03/19/2021    TP 7.6 03/19/2021    ALB 4.2 03/19/2021    TBILI 0.27 03/19/2021    DBILI <0.2 07/17/2020     Lab Results   Component Value Date    WBC 6.4 03/19/2021    RBC 4.84  03/19/2021    HGB 12.7 03/19/2021    HCT 40.6 03/19/2021    MCV 83.9 03/19/2021    MCHC 31.3 (L) 03/19/2021    RDW 17.0 (H) 03/19/2021    PLT 264 03/19/2021    MPV 10.6 03/19/2021    SEG 61 03/19/2021    LYMPHS 23 03/19/2021    MONOS 13 03/19/2021    EOS 2 03/19/2021    BASOS 1 03/19/2021     No results found for: INR, PTT  No results found for: ARTPH, ARTPO2, ARTPCO2    Anesthesia Plan:  Risks and Benefits of Anesthesia  I have personally performed an appropriate pre-anesthesia physical exam of the patient (including heart, lungs, and airway) prior to the anesthetic and reviewed the pertinent medical history, drug and allergy history, laboratory and imaging studies and consultations.   I have determined that the patient has had adequate assessment and testing.  I have validated the documentation of these elements of the patient exam and/or have made necessary changes to reflect my own observations during my pre-anesthesia exam.  Anesthetic techniques, invasive monitors, anesthetic drugs for induction, maintenance and post-operative analgesia, risks and alternatives have been explained to the patient and/or patient's representatives.    I have prescribed the anesthetic plan:         Planned anesthesia method: General         ASA 3 (Severe systemic disease)     Potential anesthesia problems identified and risks including but not limited to the following were discussed with patient and/or patient's representative: Adverse or allergic drug reaction, Recall, Ocular injury, Dental injury or sore throat, Nerve injury and Injury to brain, heart and other organs    No Beta Blocker Indicated: Patient not on beta blockers    Planned monitoring method: Routine monitoring and Arterial line monitoring    Informed Consent:  Anesthetic plan and risks discussed with Patient.  Use of blood products discussed with patient who consented to blood products.         Plan discussed with OR Nurse, Surgeon and Attending.

## 2021-03-19 NOTE — Progress Notes (Unsigned)
Allergy/Immunology Follow-up Visit      Date: 03/19/2021     76 year old here for follow up of immunodeficiency evaluation in the setting of bronchiectasis and MAC infection.    Last office visit on 12/18/2021: Given her history of persistent MAC infection, bronchiectasis, low IgM, low IgG subclass, and poor vaccine titers, advise IgG replacement therapy.  After discussion of risks and benefits, decision was made to start IVIG at 537m/kg.     Interval history:  - Got 3rd dose of IVIG on 03/13/2021, tolerating infusions well without issues   - CT scan in 01/2021 showed worsening of MAC infection.   - Otherwise, no other infections since last visit  - Clofazamine prescribed 01/17/2021 and started in 02/2021.   - Current MAC treatment regimen includes inhaled amikacin, azithromycin, ethambutol, clofazamine, rimfampin.Planning to be on this antibiotic regimen until December.   - Sputum cultures from 02/2021 negative so far.  - Right lobectomy planned for 3/14  - Respiratory symptoms have been improved, occasional cough, night sweats have resolved. She is having slightly higher exercise tolerance than she did in the past. Still having  breathlessness and lightheadedness and dizziness with getting up. Still taking a nap every day but otherwise fatigue has improved.    Review of systems:  14 point review of systems negative except per HPI    Physical Examination:   03/19/21  1041   BP: 125/79   Pulse: 85   Temp: 97.5 F (36.4 C)   SpO2: 98%     Gen: No apparent distress, well-developed, well-nourished  HEENT: NC/AT, PERRL, EOMI, sclera anicteric, MMM, oropharynx clear  Neck: no lymphadenopathy  Cardiac: RRR, no murmurs  Pulm: clear to auscultation bilaterally, coarse breath sounds in bases  Abdomen: Soft, non-tender, non-distended, normoactive bowel sounds  Skin: No rashes or ulcers  Extremities: Warm and well perfused, no peripheral edema  Neuro: Alert and oriented x person, place, time. Grossly non-focal. Moves all  extremities  Psych: Appropriate mood. Linear thought process.      Labs and diagnostics:    Recent Labs     09/22/20  1135 09/25/20  1135 09/29/20  1115 10/02/20  1115 10/06/20  1500 10/09/20  1125 10/13/20  1150 10/16/20  1200 10/23/20  1258 10/27/20  1230 10/30/20  1200 11/03/20  1055 11/06/20  1209 11/10/20  1026 11/13/20  1059 01/12/21  1355 02/12/21  1348   WBC 7.7 6.8 6.2 5.8 7.8 5.6 6.3 6.4 5.9 5.5 6.1 5.4 6.4 5.8 5.6 7.4 7.1   RBC 4.13 4.19 3.82* 3.89* 3.88* 3.89* 3.95 4.07 3.65* 3.66* 4.01 3.89* 3.72* 3.96 3.77* 4.66 4.72   HGB 11.7 11.8 10.6* 10.8* 10.8* 10.8* 10.9* 11.3 10.1* 10.2* 11.0* 10.4* 10.4* 10.9* 10.3* 12.1 12.2   HCT 35.3 36.6 33.4* 33.9* 34.1 34.2 35.0 36.0 32.6* 31.9* 35.6 34.6 33.3* 34.8 33.4* 39.0 39.1   MCV 85.5 87.4 87.4 87.1 87.9 87.9 88.6 88.5 89.3 87.2 88.8 88.9 89.5 87.9 88.6 83.7 82.8   MCH 28.3 28.2 27.7 27.8 27.8 27.8 27.6 27.8 27.7 27.9 27.4 26.7 28.0 27.5 27.3 26.0 25.8*   MCHC 33.1 32.2 31.7* 31.9* 31.7* 31.6* 31.1* 31.4* 31.0* 32.0 30.9* 30.1* 31.2* 31.3* 30.8* 31.0* 31.2*   RDW 13.9 13.9 13.9 14.0 13.8 13.8 14.0 14.4* 14.0 13.5 13.6 13.6 13.5 13.3 13.4 15.7* 17.3*   MPV 10.6 10.3 11.0 10.8 10.8 10.4 10.2 10.2 10.2 10.3 10.4 10.4 10.2 10.2 10.2 10.4 10.3   PLT 298 297 261 276 280  _0 294 284   SEG _1 66 63 66 65 66 67 64   LYMPHS _2 MONOS _3 EOS _4 BASOS _5 _6 _7 ABSNEUTRO 5.1 4.5 3.7 3.4 5.0 3.2 4.2 4.3 4.0 3.5 4.0 3.4 4.2 3.7 3.7 5.0 4.5   ABSLYMPHS 1.6 1.5 1.6 1.4 1.6 1.5 1.4 1.4 1.2 1.3 1.4 1.2 1.5 1.3 1.3 1.5 1.7   ABSMONOS 0.6 0.5 0.5 0.6 0.6 0.6 0.4 0.4 0.4 0.5 0.5 0.4 0.4 0.4 0.4 0.8 0.7   ABSEOS 0.4 0.2 0.3 0.3 0.4 0.3 0.3 0.2 0.2 0.2 0.2 0.3 0.2 0.3 0.2 0.1 0.1   DIFFTYPE _8  Automated Automated Automated Automated  _9  Automated Automated Automated       Component Ref Range & Units 1 mo ago 3 mo ago   Serotype 1 IgG ug/mL 0.66  0.33    Pneumococcal Serotype 2 IgG ug/mL 6.09  0.52    Serotype 3 IgG ug/mL 5.73  0.50    Serotype 4* IgG ug/mL 0.46  0.09    Serotype 5 IgG ug/mL 1.19  0.87    Serotype 6B* IgG ug/mL 0.30  0.21    Serotype 7F IgG ug/mL 1.50  0.73    Serotype 8 IgG ug/mL 1.90  0.17    Serotype 9N IgG ug/mL 0.37  0.10    Serotype 9V* IgG ug/mL 4.64  2.28    Pneumococcal Serotype 10A IgG ug/mL 0.83  0.61    Pneumococcal Serotype 11A IgG ug/mL 2.87  2.93    Serotype 3F IgG ug/mL 0.15  0.09    Serotype 14* IgG ug/mL 6.66  2.66    Pneumococcal Serotype 15B IgG ug/mL 5.01  2.61    Pneumococcal Serotype 46F IgG ug/mL 1.35  0.00    Serotyp 18C* IgG ug/mL 1.49  0.56    Pneumococcal Serotype 19A IgG ug/mL 2.79  2.98    Serotyp 45F* IgG ug/mL 1.23  0.86    Pneumococcal Serotype 20 IgG ug/mL 1.88  1.15    Pneumococcal Serotype 61F IgG ug/mL >29.84  13.11    Serotyp 38F* IgG ug/mL 0.13  0.08    Pneumococcal Serotype 13F IgG ug/mL 6.03  0.98      Weak responder: 13 out of 23 protective    Component Ref Range & Units 3 mo ago    Tetanus AB, IgG [IU]/mL 6.3      Component Ref Range & Units 3 mo ago    CD3+ T-Cell % 59 - 85 % 80    CD3+ T-Cell Abs 500 - 1,800 cells/uL 1,229    CD4+ T-Cell % 29 - 61 % 58    CD4+ T-Cell Abs 250 - 1,200 cells/uL 899    CD8+ T-Cell % 11 - 38 % 22    CD8+ T-Cell Abs 100 - 800 cells/uL 332    CD4:CD8 Ratio 0.70 - 4.00 2.71    CD19 B-Cell % 6 - 23 % 10  CD19 B-Cell Abs 50 - 350 cells/uL 162    NK Cells % 5 - 31 % 8    NK Cell Abs 50 - 500 cell/uL 131        Component Ref Range & Units 3 mo ago 4 mo ago   IGG 700 - 1,600 mg/dL 819  920 CM      Component Ref Range & Units 3 mo ago 4 mo ago   IGM 40 - 230 mg/dL 41  53 CM      Component Ref Range & Units 3 mo ago 4 mo ago   IGA 70 - 400 mg/dL 278  318 CM       Ref Range & Units 10 mo ago   IGG 1 248 - 810 mg/dL 500     IGG 2 130 - 555 mg/dL 112Low    IGG 3 15 - 102 mg/dL 3Low    IGG 4 2 - 96 mg/dL 21    IGG 586 - 1602 mg/dL 791          Imaging:    X-Ray Chest Single View  Result Date: 12/25/2020  IMPRESSION: No postprocedural complication identified.    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    CT Chest With Contrast  Result Date: 01/19/2021  IMPRESSION: Findings of cavitary nontuberculous mycobacterial disease has worsened with increasing size of cavitary lesions with associated increasing wall thickness. Additionally, ground-glass opacity and nodularity has increased.    Impression:    76 year old here for follow up of immunodeficiency evaluation in the setting of bronchiectasis and MAC infection.     1. Bronchiectasis  2. Persistent MAC infection  3. Specific antibody deficiency     Recommendations:  Patient with presentation concerning for specific antibody deficiency given inadequate response to Pneumovax and worsening MAC infection based on CT scan. Total IgG has been 800-900 but she has low IgG2 and IgG3. IVIG started at 54m/kg on 01/16/2021 as patient would likely benefit from immunoglobulin replacement therapy to prevent future infections. Patient tolerated last 3 doses of IVIG without any adverse effects, last dose on 03/13/2021. CT chest on 01/19/2021 with signs of worsening MAC and right lobectomy scheduled for 03/20/2021. Despite worsening imaging, patient reports improved breathing and exercise tolerance. Plan to continue IVIG through 12/2021 and will check IgG levels with next few infusions. Goal IgG level will be [phone] given bronchiectasis.     Follow up in 4 months    This patient was seen and discussed with Dr. AMerlyn Lot who agrees with the assessment and plan of care.    ASherryl Manges MD, PhD  Internal Medicine, PGY2

## 2021-03-19 NOTE — Progress Notes (Unsigned)
Allergy/Immunology Follow-up    Date: 03/19/2021  Referring MD: Courtney Heys  Reason for consultation: "IgG deficiency (results in care everywhere), bronchiectasis, cavitary MAC lung infection. Might Ig replacement be beneficial"  ______________________________________________________________________    HPI: Tiffany Chen is a 76 year old female here for follow-up for immunodeficiency. Patient was last seen on 12/18/2020 at which time recommendations were to proceed with IVIG at [place] centers starting at 564m/kg. Patient has tolerated IVIG well, and is now s/p 3 infusions. She will be undergoing lung lobectomy on 3/14.     Follow-up 12/18/20: Patient was last seen on 09/07/2020 at which time recommendations were for additional immune work-up including repeat quantitative immunoglobulins as well as vaccine titers (tetanus and pneumococcal). Pneumovax-23 administered 09/22/20. Only 14/23 protective at recheck. Discussion on IVIG replacement therapy today, and [facility]     Initial consultation: Prior medical records have been reviewed carefully prior to this consultation and salient details have been summarized below. Patient has a history of bronchiectasis, cavitary lung infection (MAC). There is no history of chronic sinusitis. She has had multiple rounds of treatment for MAC, with most recent antibiotic regimen including amikacin, azithromycin, ethambutol and rifampin.     Environmental and social history:  Tobacco smoke exposure:   Social History     Tobacco Use   Smoking Status Never   Smokeless Tobacco Never         Past Medical and Surgical History:  Past Medical History:   Diagnosis Date   . Cavitary lesion of lung 07/24/2020   . DM (diabetes mellitus) (CMS-HCC)    . 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 BRONCHOSCOPY  12/2020        Allergies:  Allergies   Allergen Reactions   . Black [employer] Other       Medications:    Current Outpatient Medications:   .  albuterol 108 (90 Base) MCG/ACT inhaler, Inhale 2 puffs by mouth every 6 hours as needed for Wheezing., Disp: 3 each, Rfl: 3  .  Amikacin Sulfate Liposome (ARIKAYCE) 590 MG/8.4ML SUSP, 590 mg by Nebulization route daily., Disp: 252 mL, Rfl: 11  .  atorvastatin (LIPITOR) 10 MG tablet, Take 1 tablet (10 mg) by mouth daily., Disp: , Rfl:   .  azithromycin (ZITHROMAX) 250 MG tablet, Take 1 tablet (250 mg) by mouth daily., Disp: 30 tablet, Rfl: 11  .  CLOFAZIMINE IRB 21-0063 50 MG CAPSULE, Take 2 capsules (1050m by mouth once daily with food as directed., Disp: 200 capsule, Rfl: 0  .  ethambutol (MYAMBUTOL) 400 MG tablet, Take 2 tablets (800 mg) by mouth daily., Disp: 60 tablet, Rfl: 11  .  ferrous sulfate 325 (65 Fe) MG tablet, Take 1 tablet (325 mg) by mouth 3 times daily., Disp: , Rfl:   .  FLUOXETINE HCL PO, 30 mg, Disp: , Rfl:   .  metFORMIN (GLUCOPHAGE) 500 mg tablet, Take 1 tablet (500 mg) by mouth 2 times daily (with meals)., Disp: , Rfl:   .  omeprazole (PRILOSEC) 20 MG capsule, Take 1 capsule (20 mg) by mouth every morning (before breakfast)., Disp: 90 capsule, Rfl: 1  .  Pediatric Multivitamins-Fl (MULTIVITAMINS/FL PO), , Disp: , Rfl:   .  ramipril (ALTACE) 2.5 MG capsule, Take 1 capsule (2.5 mg) by mouth daily., Disp: , Rfl:   .  rifampin (RIFADIN) 300 MG capsule, Take 2 capsules (600 mg) by mouth daily., Disp: 60 capsule, Rfl: 11  .  sodium chloride 7 % NEBU, 4 mL by Nebulization route every 12 hours., Disp: 240 mL, Rfl: 11    FH: No FH of atopic disease: asthma, allergic rhinitis, eczema, food allergies    Diagnostic studies:  IgA 278  IgG 819  IgM 41        Prior diagnostic studies:   CMP normal liver, total protein  CBC : no lymphopenia or thrombocytopenia                                Total time breakdown:  Pre-Visit [Reviewing last visit, Care Everywhere, recent  labs, images]/Post-Visit [Note completion, placing of orders, coordination of care]/Intra-Visit [Updating relevant history, creating a treatment plan, medical discussion with patient] : 40 minutes    Impression:  76 year old here for evaluation of:    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 Bronchiectasis   3. 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 Bronchiectasis   3. MAC infection  Patient presents for follow-up on immune testing which have confirmed immunodeficiency with antibody defects. Given her history of persistent MAC infection, bronchiectasis, low IgM, low IgG subclass, and poor vaccine titers, advise continuation of IgG replacement therapy. She has currently completed 3 rounds of IVIG successfully without major adverse effects. IgG replacement infusion therapy is medically indicated given the above clinical profile and concerns for evolution to CVID given low IgM and low IgG subclass 2/3.      We have discussed both intravenous and subcutaneous immunoglobulin replacement therapy with the patient and she would like to continue with IVIG at [facility] - Tuesdays/Wednesdays best days. Risks of immunoglobulin replacement therapy were carefully reviewed with the patient as there are several black box warnings on the products. Although the risks general occur more in high dose immunoglobulin replacement regimens such as in neurological disorders, we have seen severe adverse effects at PID dosing and advise all patients that these risks are real, severe and capable of presenting at any dosing scheme. We have reviewed anaphylaxis, shock, death, pulmonary embolism, DVT, hemolytic disorders, acute renal failure (we have seen acute renal dysfunction but not failure in non-sucrose formulations), aseptic meningitis, TRALI/pulmonary edema, swelling, weight gain and local infusion site reactions/pain.   -Will continue  at current dosing Gamunex-C at 25g every 4 weeks  -Recommendations are for immunoglobulin replacement therapy  -Risks, alternatives, benefits discussed as above  -Risk of severe progression of lung disease and life-threatening infections if immunoglobulin replacement is not approved and has been clearly documented above in terms of reasons to support prescription; failure to approve could lead to life-threatening pulmonary consequences   -IgG check at next infusion and in 2 months    RTC 4 months     The plan of care was discussed with the patient who expressed agreement and understanding. Questions were answered prior to conclusion of the visit. Patient seen and examined with Dr Evalee Mutton Seng--Concur with dx and management as outlined in the consultative report. I have seen and examined the patient with Dr. Sherryl Manges and agree with the history and physical exam.       Durel Salts, MD  Clinical Associate Professor, [facility]

## 2021-03-20 ENCOUNTER — Inpatient Hospital Stay (HOSPITAL_COMMUNITY): Payer: Medicare Other

## 2021-03-20 ENCOUNTER — Inpatient Hospital Stay
Admission: RE | Admit: 2021-03-20 | Discharge: 2021-03-22 | DRG: 164 | Disposition: A | Discharge status: Home / Self Care | Payer: Medicare Other | Attending: Thoracic Surgery | Admitting: Thoracic Surgery

## 2021-03-20 ENCOUNTER — Encounter (HOSPITAL_COMMUNITY): Admission: RE | Disposition: A | Discharge status: Home Health | Payer: Self-pay | Attending: Thoracic Surgery

## 2021-03-20 ENCOUNTER — Encounter (HOSPITAL_COMMUNITY): Payer: Self-pay | Admitting: Thoracic Surgery

## 2021-03-20 ENCOUNTER — Inpatient Hospital Stay (HOSPITAL_COMMUNITY): Payer: Medicare Other | Admitting: Student in an Organized Health Care Education/Training Program

## 2021-03-20 DIAGNOSIS — Z7409 Other reduced mobility: Secondary | ICD-10-CM

## 2021-03-20 DIAGNOSIS — I10 Essential (primary) hypertension: Secondary | ICD-10-CM

## 2021-03-20 DIAGNOSIS — Z79899 Other long term (current) drug therapy: Secondary | ICD-10-CM

## 2021-03-20 DIAGNOSIS — J439 Emphysema, unspecified: Secondary | ICD-10-CM

## 2021-03-20 DIAGNOSIS — K219 Gastro-esophageal reflux disease without esophagitis: Secondary | ICD-10-CM

## 2021-03-20 DIAGNOSIS — G8918 Other acute postprocedural pain: Secondary | ICD-10-CM | POA: Diagnosis not present

## 2021-03-20 DIAGNOSIS — R0902 Hypoxemia: Secondary | ICD-10-CM

## 2021-03-20 DIAGNOSIS — J852 Abscess of lung without pneumonia: Secondary | ICD-10-CM

## 2021-03-20 DIAGNOSIS — E119 Type 2 diabetes mellitus without complications: Secondary | ICD-10-CM

## 2021-03-20 DIAGNOSIS — Z789 Other specified health status: Secondary | ICD-10-CM

## 2021-03-20 DIAGNOSIS — E785 Hyperlipidemia, unspecified: Secondary | ICD-10-CM | POA: Diagnosis present

## 2021-03-20 DIAGNOSIS — R739 Hyperglycemia, unspecified: Secondary | ICD-10-CM

## 2021-03-20 DIAGNOSIS — J9811 Atelectasis: Secondary | ICD-10-CM | POA: Diagnosis not present

## 2021-03-20 DIAGNOSIS — Z7984 Long term (current) use of oral hypoglycemic drugs: Secondary | ICD-10-CM

## 2021-03-20 DIAGNOSIS — A310 Pulmonary mycobacterial infection: Principal | ICD-10-CM | POA: Diagnosis present

## 2021-03-20 DIAGNOSIS — J8410 Pulmonary fibrosis, unspecified: Secondary | ICD-10-CM

## 2021-03-20 DIAGNOSIS — E861 Hypovolemia: Secondary | ICD-10-CM | POA: Diagnosis not present

## 2021-03-20 DIAGNOSIS — J158 Pneumonia due to other specified bacteria: Secondary | ICD-10-CM

## 2021-03-20 DIAGNOSIS — Z4682 Encounter for fitting and adjustment of non-vascular catheter: Secondary | ICD-10-CM

## 2021-03-20 DIAGNOSIS — Z902 Acquired absence of lung [part of]: Secondary | ICD-10-CM

## 2021-03-20 HISTORY — DX: Acquired absence of lung (part of): Z90.2

## 2021-03-20 LAB — GLUCOSE (POCT)
Glucose (POCT): 137 mg/dL — ABNORMAL HIGH (ref 70–99)
Glucose (POCT): 137 mg/dL — ABNORMAL HIGH (ref 70–99)
Glucose (POCT): 162 mg/dL — ABNORMAL HIGH (ref 70–99)
Glucose (POCT): 160 mg/dL — ABNORMAL HIGH (ref 70–99)
Glucose (POCT): 154 mg/dL — ABNORMAL HIGH (ref 70–99)
Glucose (POCT): 149 mg/dL — ABNORMAL HIGH (ref 70–99)

## 2021-03-20 LAB — EMMI , DIABETES - CHECKING YOUR BLOOD SUGAR: EMMI Video Order Number: 11500637043

## 2021-03-20 LAB — EMMI, DIABETES: CARBOHYDRATE COUNTING: EMMI Video Order Number: 15805445291

## 2021-03-20 LAB — ABO/RH CONFIRMATION: ABO/RH: A NEG

## 2021-03-20 LAB — GLYCOSYLATED HGB(A1C), BLOOD: Glyco Hgb (A1C): 7 % — ABNORMAL HIGH (ref 4.8–5.8)

## 2021-03-20 SURGERY — DA VINCI XI- THORACOSCOPY, ROBOT-ASSISTED
Anesthesia: General | Site: Chest | Laterality: Right | Wound class: Class II (Clean Contaminated)

## 2021-03-20 MED ORDER — PROPOFOL 1000 MG/100ML IV EMUL
INTRAVENOUS | Status: DC | PRN
Start: 2021-03-20 — End: 2021-03-20
  Administered 2021-03-20 (×2): 50 ug/kg/min via INTRAVENOUS
  Administered 2021-03-20: 70 ug/kg/min via INTRAVENOUS
  Administered 2021-03-20: 50 ug/kg/min via INTRAVENOUS
  Administered 2021-03-20: 60 ug/kg/min via INTRAVENOUS

## 2021-03-20 MED ORDER — HYDROMORPHONE HCL 1 MG/ML IJ SOLN
0.5000 mg | INTRAMUSCULAR | Status: AC | PRN
Start: 2021-03-20 — End: 2021-03-21
  Administered 2021-03-20 – 2021-03-21 (×4): 0.5 mg via INTRAVENOUS
  Filled 2021-03-20 (×3): qty 0.5

## 2021-03-20 MED ORDER — DEXTROSE 50 % IV SOLN
12.5000 g | INTRAVENOUS | Status: DC | PRN
Start: 2021-03-20 — End: 2021-03-22

## 2021-03-20 MED ORDER — DEXTROSE (DIABETIC USE) 40 % OR GEL
1.0000 | ORAL | Status: DC | PRN
Start: 2021-03-20 — End: 2021-03-22

## 2021-03-20 MED ORDER — ACETAMINOPHEN 325 MG PO TABS
975.0000 mg | ORAL_TABLET | Freq: Once | ORAL | Status: AC
Start: 2021-03-20 — End: 2021-03-20
  Administered 2021-03-20 (×2): 975 mg via ORAL
  Filled 2021-03-20: qty 3

## 2021-03-20 MED ORDER — RIFAMPIN 300 MG OR CAPS
600.0000 mg | ORAL_CAPSULE | Freq: Every day | ORAL | Status: DC
Start: 2021-03-20 — End: 2021-03-21
  Administered 2021-03-20 (×2): 600 mg via ORAL
  Filled 2021-03-20: qty 2
  Filled 2021-03-20: qty 100
  Filled 2021-03-20: qty 2

## 2021-03-20 MED ORDER — PROPOFOL 200 MG/20ML IV EMUL
INTRAVENOUS | Status: DC | PRN
Start: 2021-03-20 — End: 2021-03-20
  Administered 2021-03-20: 50 mg via INTRAVENOUS
  Administered 2021-03-20: 08:00:00 150 mg via INTRAVENOUS
  Administered 2021-03-20: 40 mg via INTRAVENOUS
  Administered 2021-03-20: 150 mg via INTRAVENOUS
  Administered 2021-03-20: 50 mg via INTRAVENOUS

## 2021-03-20 MED ORDER — DOCUSATE SODIUM 100 MG OR CAPS
200.0000 mg | ORAL_CAPSULE | Freq: Every evening | ORAL | Status: DC
Start: 2021-03-20 — End: 2021-03-22
  Administered 2021-03-21 (×2): 200 mg via ORAL
  Filled 2021-03-20: qty 2

## 2021-03-20 MED ORDER — HYDROMORPHONE HCL 1 MG/ML IJ SOLN
0.5000 mg | INTRAMUSCULAR | Status: DC | PRN
Start: 2021-03-20 — End: 2021-03-20
  Administered 2021-03-20 (×3): 0.5 mg via INTRAVENOUS
  Filled 2021-03-20 (×2): qty 0.5

## 2021-03-20 MED ORDER — CEFAZOLIN SODIUM 1 GM IJ SOLR
INTRAMUSCULAR | Status: DC | PRN
Start: 2021-03-20 — End: 2021-03-20
  Administered 2021-03-20 (×2): 2000 mg via INTRAVENOUS

## 2021-03-20 MED ORDER — CLOFAZIMINE IRB 21-0063 50 MG CAPSULE
100.0000 mg | Freq: Every day | Status: DC
Start: 2021-03-20 — End: 2021-03-21

## 2021-03-20 MED ORDER — SUGAMMADEX SODIUM 500 MG/5ML IV SOLN
INTRAVENOUS | Status: DC | PRN
Start: 2021-03-20 — End: 2021-03-20
  Administered 2021-03-20 (×2): 200 mg via INTRAVENOUS

## 2021-03-20 MED ORDER — LANSOPRAZOLE 15 MG OR CPDR
15.0000 mg | DELAYED_RELEASE_CAPSULE | Freq: Every day | ORAL | Status: DC
Start: 2021-03-21 — End: 2021-03-22
  Administered 2021-03-21 – 2021-03-22 (×3): 15 mg via ORAL
  Filled 2021-03-20 (×2): qty 1

## 2021-03-20 MED ORDER — MAGNESIUM HYDROXIDE 400 MG/5ML OR SUSP
30.0000 mL | Freq: Two times a day (BID) | ORAL | Status: DC | PRN
Start: 2021-03-20 — End: 2021-03-22

## 2021-03-20 MED ORDER — AZITHROMYCIN 250 MG OR TABS
250.0000 mg | ORAL_TABLET | Freq: Every day | ORAL | Status: DC
Start: 2021-03-20 — End: 2021-03-22
  Administered 2021-03-20 – 2021-03-21 (×3): 250 mg via ORAL
  Filled 2021-03-20 (×3): qty 1

## 2021-03-20 MED ORDER — SUGAMMADEX SODIUM 200 MG/2ML IV SOLN
INTRAVENOUS | Status: AC
Start: 2021-03-20 — End: 2021-03-20
  Filled 2021-03-20: qty 2

## 2021-03-20 MED ORDER — HELP
590.0000 mg | Freq: Every day | Status: DC
Start: 2021-03-20 — End: 2021-03-22
  Administered 2021-03-21 – 2021-03-22 (×3): 590 mg via RESPIRATORY_TRACT
  Filled 2021-03-20 (×2): qty 1

## 2021-03-20 MED ORDER — MORPHINE SULFATE 2 MG/ML IJ SOLN
2.0000 mg | INTRAMUSCULAR | Status: DC | PRN
Start: 2021-03-20 — End: 2021-03-20

## 2021-03-20 MED ORDER — NALOXONE HCL 0.4 MG/ML IJ SOLN
0.1000 mg | INTRAMUSCULAR | Status: DC | PRN
Start: 2021-03-20 — End: 2021-03-22

## 2021-03-20 MED ORDER — MORPHINE SULFATE 4 MG/ML IJ SOLN
4.0000 mg | INTRAMUSCULAR | Status: DC | PRN
Start: 2021-03-20 — End: 2021-03-20
  Administered 2021-03-20 (×2): 4 mg via INTRAVENOUS
  Filled 2021-03-20: qty 1

## 2021-03-20 MED ORDER — LIDOCAINE 4 % EX PTCH
1.0000 | MEDICATED_PATCH | CUTANEOUS | Status: DC
Start: 2021-03-20 — End: 2021-03-22
  Administered 2021-03-20 – 2021-03-22 (×4): 1 via TRANSDERMAL
  Filled 2021-03-20 (×3): qty 1

## 2021-03-20 MED ORDER — PHENYLEPHRINE HCL 10 MG/ML INJECTION WRAPPED RECORD
INTRAMUSCULAR | Status: DC | PRN
Start: 2021-03-20 — End: 2021-03-20
  Administered 2021-03-20: 08:00:00 20 ug/min via INTRAVENOUS
  Administered 2021-03-20: 10 ug/min via INTRAVENOUS
  Administered 2021-03-20: 20 ug/min via INTRAVENOUS
  Administered 2021-03-20: 15 ug/min via INTRAVENOUS

## 2021-03-20 MED ORDER — GLUCOSE 4 GM PO CHEW (CUSTOM)
4.0000 | CHEWABLE_TABLET | ORAL | Status: DC | PRN
Start: 2021-03-20 — End: 2021-03-22

## 2021-03-20 MED ORDER — HYDROMORPHONE HCL 1 MG/ML IJ SOLN
0.5000 mg | Freq: Once | INTRAMUSCULAR | Status: DC
Start: 2021-03-20 — End: 2021-03-20

## 2021-03-20 MED ORDER — ONDANSETRON HCL 4 MG/2ML IV SOLN
INTRAMUSCULAR | Status: DC | PRN
Start: 2021-03-20 — End: 2021-03-20
  Administered 2021-03-20 (×2): 4 mg via INTRAVENOUS

## 2021-03-20 MED ORDER — FENTANYL CITRATE (PF) 50 MCG/ML IJ SOLN (WRAPPED RECORD) ~~LOC~~
50.0000 ug | INTRAMUSCULAR | Status: DC | PRN
Start: 2021-03-20 — End: 2021-03-20
  Administered 2021-03-20 (×3): 50 ug via INTRAVENOUS
  Filled 2021-03-20 (×2): qty 1

## 2021-03-20 MED ORDER — IPRATROPIUM-ALBUTEROL 0.5-2.5 (3) MG/3ML IN SOLN
3.0000 mL | RESPIRATORY_TRACT | Status: DC | PRN
Start: 2021-03-20 — End: 2021-03-22

## 2021-03-20 MED ORDER — IPRATROPIUM-ALBUTEROL 0.5-2.5 (3) MG/3ML IN SOLN
3.0000 mL | RESPIRATORY_TRACT | Status: DC
Start: 2021-03-20 — End: 2021-03-20

## 2021-03-20 MED ORDER — MEPERIDINE HCL 25 MG/ML IJ SOLN
12.5000 mg | INTRAMUSCULAR | Status: DC | PRN
Start: 2021-03-20 — End: 2021-03-20

## 2021-03-20 MED ORDER — SODIUM CHLORIDE 0.9 % IV SOLN
INTRAVENOUS | Status: DC | PRN
Start: 2021-03-20 — End: 2021-03-20

## 2021-03-20 MED ORDER — ONDANSETRON HCL 4 MG/2ML IV SOLN
4.0000 mg | Freq: Four times a day (QID) | INTRAMUSCULAR | Status: DC | PRN
Start: 2021-03-20 — End: 2021-03-22

## 2021-03-20 MED ORDER — LACTATED RINGERS IV SOLN
INTRAVENOUS | Status: DC | PRN
Start: 2021-03-20 — End: 2021-03-20

## 2021-03-20 MED ORDER — ATORVASTATIN CALCIUM 10 MG OR TABS
10.0000 mg | ORAL_TABLET | Freq: Every day | ORAL | Status: DC
Start: 2021-03-20 — End: 2021-03-22
  Administered 2021-03-20 – 2021-03-21 (×3): 10 mg via ORAL
  Filled 2021-03-20 (×2): qty 1

## 2021-03-20 MED ORDER — GLUCAGON HCL 1 MG IJ SOLR
1.0000 mg | Freq: Once | INTRAMUSCULAR | Status: DC | PRN
Start: 2021-03-20 — End: 2021-03-22

## 2021-03-20 MED ORDER — SENNA 8.6 MG OR TABS
2.0000 | ORAL_TABLET | Freq: Every morning | ORAL | Status: DC
Start: 2021-03-20 — End: 2021-03-22
  Administered 2021-03-20 – 2021-03-22 (×4): 17.2 mg via ORAL
  Filled 2021-03-20 (×3): qty 2

## 2021-03-20 MED ORDER — IPRATROPIUM-ALBUTEROL 0.5-2.5 (3) MG/3ML IN SOLN
3.0000 mL | RESPIRATORY_TRACT | Status: DC
Start: 2021-03-21 — End: 2021-03-22
  Administered 2021-03-21 – 2021-03-22 (×3): 3 mL via RESPIRATORY_TRACT
  Filled 2021-03-20 (×2): qty 1

## 2021-03-20 MED ORDER — OXYCODONE HCL 10 MG OR TABS
10.0000 mg | ORAL_TABLET | ORAL | Status: DC | PRN
Start: 2021-03-20 — End: 2021-03-22
  Administered 2021-03-20 – 2021-03-22 (×10): 10 mg via ORAL
  Filled 2021-03-20 (×9): qty 1

## 2021-03-20 MED ORDER — LIDOCAINE HCL 2 % IJ SOLN WRAPPED RECORD
INTRAMUSCULAR | Status: DC | PRN
Start: 2021-03-20 — End: 2021-03-20
  Administered 2021-03-20 (×2): 60 mg via INTRAVENOUS

## 2021-03-20 MED ORDER — DEXAMETHASONE SODIUM PHOSPHATE 4 MG/ML IJ SOLN (CUSTOM)
INTRAMUSCULAR | Status: DC | PRN
Start: 2021-03-20 — End: 2021-03-20
  Administered 2021-03-20 (×2): 6 mg via INTRAVENOUS

## 2021-03-20 MED ORDER — EPHEDRINE SULFATE (PRESSORS) 50 MG/ML IV SOLN
INTRAVENOUS | Status: DC | PRN
Start: 2021-03-20 — End: 2021-03-20
  Administered 2021-03-20 (×2): 5 mg via INTRAVENOUS

## 2021-03-20 MED ORDER — FLUOXETINE HCL 10 MG OR TABS
30.0000 mg | ORAL_TABLET | Freq: Every day | ORAL | Status: DC
Start: 2021-03-20 — End: 2021-03-22
  Administered 2021-03-20 – 2021-03-22 (×4): 30 mg via ORAL
  Filled 2021-03-20: qty 3
  Filled 2021-03-20: qty 100
  Filled 2021-03-20: qty 3

## 2021-03-20 MED ORDER — RAMIPRIL 2.5 MG OR CAPS
2.5000 mg | ORAL_CAPSULE | Freq: Every day | ORAL | Status: DC
Start: 2021-03-21 — End: 2021-03-22
  Administered 2021-03-21 (×2): 2.5 mg via ORAL
  Filled 2021-03-20 (×2): qty 1

## 2021-03-20 MED ORDER — IPRATROPIUM-ALBUTEROL 0.5-2.5 (3) MG/3ML IN SOLN
3.0000 mL | RESPIRATORY_TRACT | Status: DC | PRN
Start: 2021-03-20 — End: 2021-03-20

## 2021-03-20 MED ORDER — OXYCODONE HCL 5 MG OR TABS
5.0000 mg | ORAL_TABLET | ORAL | Status: DC | PRN
Start: 2021-03-20 — End: 2021-03-22

## 2021-03-20 MED ORDER — ETHAMBUTOL HCL 400 MG OR TABS
800.0000 mg | ORAL_TABLET | Freq: Every day | ORAL | Status: DC
Start: 2021-03-20 — End: 2021-03-21
  Administered 2021-03-20 (×2): 800 mg via ORAL
  Filled 2021-03-20 (×3): qty 2

## 2021-03-20 MED ORDER — ACETAMINOPHEN 325 MG PO TABS
975.0000 mg | ORAL_TABLET | Freq: Three times a day (TID) | ORAL | Status: DC
Start: 2021-03-20 — End: 2021-03-22
  Administered 2021-03-20 – 2021-03-22 (×7): 975 mg via ORAL
  Filled 2021-03-20 (×6): qty 3

## 2021-03-20 MED ORDER — NALOXONE HCL 0.4 MG/ML IJ SOLN
0.1000 mg | INTRAMUSCULAR | Status: DC | PRN
Start: 2021-03-20 — End: 2021-03-20

## 2021-03-20 MED ORDER — PHENYLEPHRINE DILUTION 100 MCG/ML IJ SOLN
INTRAVENOUS | Status: DC | PRN
Start: 2021-03-20 — End: 2021-03-20
  Administered 2021-03-20 (×2): 50 ug via INTRAVENOUS
  Administered 2021-03-20: 100 ug via INTRAVENOUS

## 2021-03-20 MED ORDER — INDOCYANINE GREEN DILUTION 2.5 MG/ML IJ SOLN (CUSTOM)
Status: AC
Start: 2021-03-20 — End: 2021-03-20
  Filled 2021-03-20: qty 10

## 2021-03-20 MED ORDER — INSULIN LISPRO (HUMAN) 100 UNIT/ML SC SOLN (CUSTOM)
0.0000 [IU] | Freq: Every evening | INTRAMUSCULAR | Status: DC
Start: 2021-03-20 — End: 2021-03-22

## 2021-03-20 MED ORDER — FENTANYL CITRATE (PF) 100 MCG/2ML IJ SOLN
INTRAMUSCULAR | Status: AC
Start: 2021-03-20 — End: 2021-03-20
  Filled 2021-03-20: qty 2

## 2021-03-20 MED ORDER — INSULIN LISPRO (HUMAN) 100 UNIT/ML SC SOLN (CUSTOM)
0.0000 [IU] | Freq: Three times a day (TID) | INTRAMUSCULAR | Status: DC
Start: 2021-03-20 — End: 2021-03-22
  Administered 2021-03-20 – 2021-03-22 (×6): 1 [IU] via SUBCUTANEOUS
  Filled 2021-03-20 (×5): qty 1

## 2021-03-20 MED ORDER — FENTANYL CITRATE (PF) 250 MCG/5ML IJ SOLN
INTRAMUSCULAR | Status: DC | PRN
Start: 2021-03-20 — End: 2021-03-20
  Administered 2021-03-20 (×2): 50 ug via INTRAVENOUS
  Administered 2021-03-20: 100 ug via INTRAVENOUS
  Administered 2021-03-20: 25 ug via INTRAVENOUS
  Administered 2021-03-20: 50 ug via INTRAVENOUS
  Administered 2021-03-20: 25 ug via INTRAVENOUS
  Administered 2021-03-20 (×2): 50 ug via INTRAVENOUS

## 2021-03-20 MED ORDER — OXYCODONE HCL ER 10 MG PO T12A
10.0000 mg | EXTENDED_RELEASE_TABLET | Freq: Every evening | ORAL | Status: DC
Start: 2021-03-20 — End: 2021-03-22
  Administered 2021-03-20 – 2021-03-21 (×3): 10 mg via ORAL
  Filled 2021-03-20 (×2): qty 1

## 2021-03-20 MED ORDER — LABETALOL HCL 5 MG/ML IV SOLN
5.0000 mg | INTRAVENOUS | Status: DC | PRN
Start: 2021-03-20 — End: 2021-03-20

## 2021-03-20 MED ORDER — ONDANSETRON HCL 4 MG/2ML IV SOLN
4.0000 mg | Freq: Once | INTRAMUSCULAR | Status: DC | PRN
Start: 2021-03-20 — End: 2021-03-20

## 2021-03-20 MED ORDER — SODIUM CHLORIDE 0.9 % IJ SOLN (CUSTOM)
INTRAMUSCULAR | Status: DC | PRN
Start: 2021-03-20 — End: 2021-03-20
  Administered 2021-03-20 (×2): 50 mL
  Filled 2021-03-20: qty 20

## 2021-03-20 MED ORDER — HEPARIN SODIUM (PORCINE) 5000 UNIT/ML IJ SOLN
5000.0000 [IU] | Freq: Two times a day (BID) | INTRAMUSCULAR | Status: DC
Start: 2021-03-21 — End: 2021-03-22
  Administered 2021-03-21 – 2021-03-22 (×4): 5000 [IU] via SUBCUTANEOUS
  Filled 2021-03-20 (×3): qty 1

## 2021-03-20 MED ORDER — ROCURONIUM BROMIDE 100 MG/10ML IV SOLN
INTRAVENOUS | Status: DC | PRN
Start: 2021-03-20 — End: 2021-03-20
  Administered 2021-03-20 (×2): 50 mg via INTRAVENOUS

## 2021-03-20 MED ORDER — FENTANYL CITRATE (PF) 50 MCG/ML IJ SOLN (WRAPPED RECORD) ~~LOC~~
25.0000 ug | INTRAMUSCULAR | Status: DC | PRN
Start: 2021-03-20 — End: 2021-03-20

## 2021-03-20 MED ORDER — LACTATED RINGERS IV SOLN
INTRAVENOUS | Status: DC
Start: 2021-03-20 — End: 2021-03-20

## 2021-03-20 MED ORDER — OXYCODONE HCL 5 MG OR TABS
5.0000 mg | ORAL_TABLET | ORAL | Status: DC | PRN
Start: 2021-03-20 — End: 2021-03-20
  Administered 2021-03-20 (×2): 5 mg via ORAL
  Filled 2021-03-20: qty 1

## 2021-03-20 MED ORDER — FENTANYL CITRATE (PF) 250 MCG/5ML IJ SOLN
INTRAMUSCULAR | Status: AC
Start: 2021-03-20 — End: 2021-03-20
  Filled 2021-03-20: qty 5

## 2021-03-20 MED ORDER — BISACODYL 10 MG RE SUPP
10.0000 mg | Freq: Every day | RECTAL | Status: DC | PRN
Start: 2021-03-20 — End: 2021-03-22

## 2021-03-20 SURGICAL SUPPLY — 73 items
BASIN SINGLE BASIC STERILE (Misc Surgical Supply) ×2 IMPLANT
BLANKET BAIR HUGGER LOWER BODY (Misc Medical Supply) ×2 IMPLANT
CATHETER THORACIC SILICONE 24FR STRAIGHT (Lines/Drains)
CATHETER THORACIC SILICONE 24FR STRAIGHT (INACTIVE) (Lines/Drains) IMPLANT
CLEARIFY SYSTEM VISUALIZATION SCOPE WARMER D-HELP (Lap/Endo/Arthroscopy) ×2 IMPLANT
CLIP VESOLOK LARGE PURPLE BOX/14 (Misc Medical Supply)
CONNECTOR AMSURE 5-IN-1 (Misc Medical Supply) ×2 IMPLANT
CONTAINER PRECISION SPECIMEN, 4OZ- STERILE (Misc Medical Supply) ×2 IMPLANT
COVER CAMERA LIGHT HANDLE 57MM BLUE HARMON (Misc Surgical Supply) IMPLANT
DA VINCI XI ARM DRAPE (Drapes/towels) ×6
DA VINCI XI COLUMN DRAPE (Drapes/towels) ×2 IMPLANT
DA VINCI XI LARGE CLIP APPLIER 8MM (Robotic instruments)
DA VINCI XI PERMANENT CAUTERY SPATULA 8MM (Robotic instruments) IMPLANT
DRAIN ATRIUM OASIS DRY SUCTION WATER SEAL CHEST (Lines/Drains) ×2 IMPLANT
DRAPE IOBAN 2 ANTIMICROBIAL 23" X 17" (Misc Surgical Supply) ×2
DRESSING 4X4 SPONGE DRAIN - 6 PLY STRL (Dressings/packing) ×2 IMPLANT
DRESSING MEPILEX BORDER FLEX 3IN X 3IN (Dressings/packing) ×2
DRESSING MEPILEX BORDER LITE 4CM X 5CM THIN FOAM (Dressings/packing) ×8
DRESSING MEPILEX BORDER SACRUM 23 X 23CM (Dressings/packing)
DRESSING MEPILEX BORDER SACRUM SM 16 X 20CM (Dressings/packing) ×6 IMPLANT
DRESSING TEGADERM 8" X 12" (Dressings/packing) ×2
DRESSING TEGADERM TRNSPR FILM 4"X4.75" (Dressings/packing) ×2 IMPLANT
DRESSING TELFA STRL 3" X 8" (Dressings/packing) ×2
ENDOCATCH II POUCH 15MM (Lap/Endo/Arthroscopy) ×2 IMPLANT
FILM IOBAN 2 ANTIMICROBIAL 23" X 17" (Misc Surgical Supply) ×1 IMPLANT
FORCEP DA VINCI XI FENESTRATED BIPOLAR 8MM (Robotic instruments) IMPLANT
FORCEPS DA VINCI XI 8MM CADIERE ENDOWRIST (Robotic instruments) IMPLANT
GLOVE BIOGEL PI INDICATOR SIZE 6 (Gloves/Gowns) ×2 IMPLANT
GLOVE BIOGEL PI INDICATOR SIZE 6.5 (Gloves/Gowns) ×2 IMPLANT
GLOVE BIOGEL PI ULTRATOUCH SIZE 6 (Gloves/Gowns) ×2
GLOVE SURGICAL BIOGEL SIZE 6 (Gloves/Gowns) ×4
GLOVE SURGICAL BIOGEL SIZE 8 (Gloves/Gowns) ×2 IMPLANT
GOWN SURGICAL ULTRA LG BLUE, AAMI LVL 3 (Gloves/Gowns) ×2
GOWN SURGICAL ULTRA XL BLUE, AAMI LVL 3 (Gloves/Gowns) ×2 IMPLANT
GRASPER DA VINCI XI ENDOWRIST TIP-UP FENESTRATED (Robotic instruments)
JMC ONLY- O.R. CAMERA COVER LIGHT, STERILE (Misc Surgical Supply)
LIGASURE MARYLAND TIP 5MM X 37CM - LF1737 (Lap/Endo/Arthroscopy) IMPLANT
MARKER DEVON UTILITY WITH LABELS- STERILE (Misc Medical Supply) IMPLANT
OBTURATOR DA VINCI XI BLADELESS OPTICAL 8MM- DISPOSABLE (Lap/Endo/Arthroscopy) ×2
PAD ARMBOARD CONV FOAM 2X8X20 (Patient Care Supply) ×2 IMPLANT
PAD GROUND VALLEYLAB REM ADULT E7507 (Cautery)
PORT ACCESS AIRSEAL 12MM X 100MM BLDLESS OBTURATOR (Lap/Endo/Arthroscopy) ×2
POUCH ENDOCATCH SPECIMEN RETRIEVAL BAG 10 MM X 26CM (Lap/Endo/Arthroscopy) IMPLANT
PROTECTOR ULNAR NERVE PAD, YELLOW (Patient Care Supply) IMPLANT
RELOAD 45MM STAPLER XI SUREFORM 2.5 WHITE (Staplers and staple reloads) ×2 IMPLANT
RELOAD 45MM STAPLER XI SUREFORM 2.5 WHITE BX/6 (Staplers and staple reloads) ×2
RELOAD 45MM STAPLER XI SUREFORM 4.3 GREEN BX/6 (Staplers and staple reloads) ×22 IMPLANT
SEAL DA VINCI XI UNIVERSAL 5-8MM (Lap/Endo/Arthroscopy) ×2
SKIN AFFIX 0.4ML HV (Dressings/packing)
SKIN PREP -BATH CLOTH CHG 2% (Prep Solutions) IMPLANT
SLEEVE SCD KNEE MEDIUM (Patient Care Supply) IMPLANT
SOLUTION ELECTRO-LUBE ANTI-STICK, BOTTLE (Lap/Endo/Arthroscopy) IMPLANT
SOLUTION IRR POUR BTL 0.9% NS 1000ML (Non-Pharmacy Meds/Solutions) IMPLANT
SOLUTION IRR POUR BTL H20 1000ML (Non-Pharmacy Meds/Solutions)
SOLUTION IV 0.9% NS 1000ML (Non-Pharmacy Meds/Solutions)
STAPLER ENDOWRIST CANNULA REDUCER XI 12-8MM- SINGLE USE (Lap/Endo/Arthroscopy)
STAPLER ENDOWRIST CANNULA SEAL XI (Lap/Endo/Arthroscopy) ×2
STAPLER SHEATH ENDOWRIST DA VINCI SI/XI (Robotic instruments) IMPLANT
STAPLER SUREFORM XI 45MM CURVE TIP (JMC LL) (Staplers and staple reloads) ×2 IMPLANT
STAPLER XI ENDOWRIST 30 CURVED TIP (Robotic instruments) IMPLANT
SUCTION IRRIGATOR STRYKEFLOW2- SINGLE USE (Lap/Endo/Arthroscopy) ×2 IMPLANT
SURGICAL PACK LAPAROSCOPIC (Procedure Packs/kits) ×2 IMPLANT
SURGICAL PACK ROBOTIC SURGERY (Procedure Packs/kits)
SUTURE MONOCRYL PLUS 4-0 27" PS-2 MCP426 (Suture) ×4 IMPLANT
SUTURE PDS PLUS 3-0 27" SH (PDP416) (Suture) IMPLANT
SUTURE PROLENE 0 30 FSLX (Suture) ×2 IMPLANT
SUTURE VICRYL 2-0 36" CT-1 (Suture) ×4
SUTURE VICRYL 2-0 36" CT-1 VCP945H (Suture) ×2 IMPLANT
SUTURE VICRYL PLUS 0 36" CT-1 VCP946 (Suture) ×2 IMPLANT
SYRINGE MONOJECT 20ML LL (Needles/punch/cannula/biopsy) IMPLANT
TUBE YANKAUER SUCTION WITH BULBOUS TIP (Tubing/Suction)
TUBING AIRSEAL TRI-LUMEN FILTERED (Tubing/Suction) ×2
TUBING SUCTION MEDI-VAC 9/32" X 20' (Tubing/Suction) ×2

## 2021-03-20 NOTE — H&P (Signed)
RAPID RECOVERY ICU ADMISSION / PROGRESS NOTE    POD Day of Surgery S/P:DA Tori Milks Robotic upper lobectomy with middle and lower  lobe wedge resection (Right: Chest)      HPI: Tiffany Chen is a89 year oldfemale with a PMH of bronchiectasis, cavitary MAC lung infection, ruptured diverticulitis S/P colostomy in 2009, diabetes, ICH S/P embolization now S/P Robotic right upper lobectomy with en bloc lower and middle lobe wedge resection, middle lobe wedge resection, intercostal nerve blocks.    Op Course:   See formal Op note  Fluids/Blood Products:       IV Fluids: 1L LR    Blood Products: none    EBL: 38m     Urine Output: not recorded     Events:   Patient stable post op. Taking PO pain medications with relief  Breathing 2L NC, nonlabored breathing.   CXR: Chest tube in place, no PNX     Past Medical and Surgical History:  Past Medical History:   Diagnosis Date   . Cavitary lesion of lung 07/24/2020   . DM (diabetes mellitus) (CMS-HCC)    . 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     Past Surgical History:   Procedure Laterality Date   . BRONCHOSCOPY  12/2020       Labs  Reviewed     Imaging   03/20/2021: Right-sided large-bore chest tube with no evidence of pneumothorax. Status post right upper lobectomy    Physical Exam:  General: NAD  Neuro/Mental Status: alert and oriented x 3, follows commands, PERRL, sensation and strength grossly normal  Chest: Chest tube to waterseal, minimal airleak - expected  CV: regular rate and rhythm  Pulm: clear to auscultation bilaterally, symmetric chest expansion, no respiratory distress   Abdomen: Non-distended, soft, colostomy in place  INCISION: covered with gauze   Extremities: no  edema, pulses 2+    ASSESSMENT/PLAN:    POD Day of Surgery S/P:DA VTori MilksRobotic upper lobectomy with middle and lower  lobe wedge resection (Right: Chest)      #Acute post operative pain:   - Multimodal pain control    #Post op Hypovolemia  - IVF resuscitation as indicated     #Post-Op hypoxemia  - wean supplemental O2 to maintain SpO2>92%  - incentive spirometer 10x/hr while awake    #HTN/HLD   - atorvastatin   - ramipril with high holding parameters     #DM   - Insulin sliding scale while in patient   - hold metformin     #lobectomy and middle/lower lobe resection   - continue ethambutol and rifampin   - Chest tube to water seal, never on suction   - Keep fluid status net negative to avoid pulmonary edema   - Wean supplemental O2 to SpO2>92%  - aggressive incentive spirometry and pulmonary hygiene    - strict aspiration precautions  - monitor for expanding subcutaneous emphysema   - CXR in AM, per surgical team   - Diet: Diet Therapeutic; Standard Carbohydrate Limited  Nutritional Supplement - Oral ERAS: Gum; Deliver Supplements: TID Nurse to discontinue order once patient has had a bowel movement   - Up and OOB when surgically cleared, ambulate >3 times /  day, up and OOB for meals   - DVT Prophylaxis: SCD    Dispo: Rapid Recovery ICU for 24 hours    Elige Shouse AG-ACNP   ACCM/RRU/SCCM

## 2021-03-20 NOTE — Anesthesia Postprocedure Evaluation (Signed)
Anesthesia Post Note    Patient: Tiffany Chen    Procedure(s) Performed: Procedure(s):  DA Rudene Christians Robotic upper lobectomy with middle and lower  lobe wedge resection      Final anesthesia type: Monitored Anesthesia Care    Patient location: PACU    Post anesthesia pain: adequate analgesia    Mental status: awake and alert     Airway Patent: Yes    Last Vitals:   Vitals Value Taken Time   BP 132/82 03/20/21 0945   Temp 35.9 C 03/20/21 0920   Pulse 67 03/20/21 0953   Resp 17 03/20/21 0953   SpO2 99 % 03/20/21 0953   Vitals shown include unvalidated device data.     Post vital signs: stable    Hydration: adequate    N/V:no    Anesthetic complications: no    Plan of care per primary team.

## 2021-03-20 NOTE — Op Note (Signed)
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  She also has a peripheral abscess in the right middle lobe.  She is brought to the OR for the above procedures to remove infection and prevent spread to the healthy lung.    Brief Description of Procedure: After informed consent was obtained, the patient was brought to the OR and placed in a supine position. General anesthesia was smoothly induced with a dual-lumen endotracheal tube placed. She was placed in a left lateral decubitus position, and her right chest was prepped and draped into a sterile field. In the 7th ICS just anterior to the posterior axillary line, an 61mm incision was made after administration of Exparel, and dissection was carried into the chest. A 26mm robot port was placed, and insufflation was begun with CO2 with pressure and flow at 10cm. A handsbreadth above and anterior to the camera incision, a 23mm robot port was placed. A handsbreadth posterior and inferior to the camera incision, a 55mm robotic port was placed. A handbreadth posterior to this in the same ICS, another 10mm robot port was placed. Finally, a 27mm Airseal port was placed just above the diaphragm between the camera and left-hand ports in the same ICS. Please note that each incisional intercostal space was blocked with local before incision and port placement. Intercostal nerve blocks were placed posteriorly from T3-T10.  The robot was then brought to the field and docked. The superior segment was plastered to the upper lobe in the fissure.  Green loads of the robotic stapler were used to wedge a small portion of superior segment of lower lobe  above the fissure.  The fissure was then explored, and 2 level 11 nodes were dissected from the ongoing PA.  The upper and middle lobes were retracted posteriorly, and the pleura overlying the anterior hilum was dissected. The middle lobe vein was identified, and the upper lobe vein was dissected free and taken with a white load of the robotic stapler. This brought a level 11 lymph node packet. Level 11 was dissected, and careful dissection was performed to create a plane between the bronchus and the truncus. The truncus was taken with a white load. The bronchus was then dissected and taken with a green load.The fissure was then taken with multiple 46mm green loads with a small involved middle lobe wedge included. A separate middle lobe wedge containing the additional abscess was performed with green loads.  The lobectomy specimen was placed in a large bag and brought out through the access incision. The robot was dedocked after all instruments were removed.  Meticulous hemostasis was achieved, and a 24 Fr CT was placed through the camera incision after all instruments and ports were removed under direct vision. The lung was reinflated under direct vision of the camera through the access incision. All incisions were closed with multiple layers of absorbable suture.  Skin glue and sterile dressings were applied.  The patient was transported to the PTU in stable condition after extubation in the OR.    EBL: 40cc    Fluids: Per anesthesia record    Complications: None

## 2021-03-20 NOTE — Addendum Note (Signed)
Addendum  created 03/20/21 0955 by Ardeen Fillers, MD    Clinical Note Signed, Intraprocedure Blocks edited

## 2021-03-20 NOTE — Interdisciplinary (Signed)
03/20/21 1151   Initial Assessment   CM Initial Assessment * Completed   Readmission Risk Assessment   Readmission Within 30 Days of Discharge * No   Recent Hospitalizations (Within Last 6 Months) * No   High Risk For Readmission * Yes   High Risk Indicators Over age 76   Action Taken To Prevent Readmission After Discharge * PCP follow up appointment ordered  (Will set up prior to discharge)   Recommendations to the Physician* To be determined   Patient Information   Where was the patient admitted from? * Home   Address on Facesheet correct?* Yes   Patient contact phone number on Facesheet correct? Yes   PCP listed on Facesheet correct? Yes   Prior to Level of Function * Ambulatory;Independent with ADL's   Assistive Device * Dan Humphreys  (has husbands old walker if needed)   Home Care Services * No   Additional Services on Admission Not Applicable   Available Assistance/Support System  and Prior Community Resources Family member(s)   Primary Caretaker(s) * Self;Family   Primary Family/Caregiver Contact Name, Number and Relationship * self   Permission to Contact * Not Applicable   Secondary Contact Name, Number and Relationship Merleen Nicely lives with patient [phone]   Social Worker Consult   Do you need to see a Child psychotherapist? * No   Discharge Planning   Living Arrangements on Admission* Family Member   Type of Residence * Apartment   Stairs/Steps to home  Yes   Number of Stairs/Steps 15 steps   Patient's Discharge Goal(s) Home   Anticipated Discharge Disposition/Needs Too soon to be determined   Anticipated DischargeTransportation *  Car   Anticipated Discharge Transportation Details Brother Fayrene Fearing will pick up in car   Barriers to Discharge * None   Patient Engaged in Discharge Planning * Yes   Others Engaged in Discharge Plan: Name, Relationship and Phone Number self   Family/Caregiver's Assessed for * Not Applicable   Respite Care * Not Applicable   Patient/Family/Other Are In Agreement With Discharge Plan *  Yes   Public Health Clearance Needed * Not Applicable   Medical Necessity:S/PDA Rudene Christians Robotic upper lobectomy with middle and lower lobe wedge resection    LOS at time of Initial Assessment: 6 Hours  Pt admitted on 03/20/2021  5:20 AM    LACE+ Score: 50    Address verified as discharge address:   [address]   [address]  [place] [address] [phone] (home)    PCP verified:  [name]  [address] / [place] [address]  telephone [phone]  fax [phone]    Pharmacy:  [facility]    PLOF:  Independent with ADLS    Hx of SNF placement:  none    Hx of Home health services:  no   - Use previous home health agency upon discharge (yes/no): n/a    DME (includes ALL home equipment): Currently does not use anything but has husbands old walker that she said she can use     DISCHARGE PLANNING    Support system:  Family Brother Fayrene Fearing [phone], she lives with him and he will be ride home  Sister Corrie Dandy [phone] lives close by available for assistance.    Anticipated DC disposition (home, SNF, etc) :  home    Anticipated DC needs (HH, DME, none, etc):  CM will follow up on needs, has PT/OT eval in tomorrow  Anticipated barriers to discharge:   none    Transportation: Fayrene Fearing brother            Expected discharge date:  TBD       Evie Lacks, RN

## 2021-03-20 NOTE — H&P (Deleted)
Note entered in error

## 2021-03-20 NOTE — H&P (Signed)
[facility]   Lung New Patient Evaluation    Introduction: Tiffany Chen is a 76 year old female who is being seen at the request of Katheren Shams for further evaluation of cavitary MAC lung infection.     Subjective:       HPI: Tiffany Chen is a 76 year old female with a PMH of bronchiectasis, cavitary MAC lung infection, ruptured diverticulitis S/P colostomy in 2009, diabetes, ICH S/P embolization. Smoking status: never smoker. History of radiation unrelated to lesion in question: no.    She notes weight loss and poor appetite. Symptoms have been present over the past few months and have been persistent. She has had unintentional weight loss (~30 lbs in the past 6 months). She denies any current fever, SOB/DOE, cough, chest pain, wheezing, and hemoptysis. She is able to climb 2 flights of stairs without difficulty. Recently walked up 5 flights of stairs without difficulty.     Work-up thus far has included CT chest.    PFTs: None  Cardiac eval: None     PMHx:   Past Medical History        Past Medical History:   Diagnosis Date   . Cavitary lesion of lung 07/24/2020   . DM (diabetes mellitus) (CMS-HCC)    . 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 BRONCHOSCOPY  12/2020           Thoracic surgery?: no    Social Hx:   Social History              Socioeconomic History   . Marital status: Widowed     Spouse name: Not on file   . Number of children: Not on file   . Years of education: Not on file   . Highest education level: Not on file   Occupational History   . Not on file   Tobacco Use   . Smoking status: Never   . Smokeless tobacco: Never   Substance and Sexual Activity   . Alcohol use: Not Currently   . Drug use: Not Currently   . Sexual activity: Not on file   Other Topics Concern   . Not on file   Social History Narrative   .  Not on file     Social Determinants of Health     Financial Resource Strain: Not on file   Food Insecurity: Not on file   Transportation Needs: Not on file   Physical Activity: Not on file   Stress: Not on file   Social Connections: Not on file   Intimate Partner Violence: Not on file   Housing Stability: Not on file           Accepts blood products?: yes    Family Hx:   Family History   No family history on file.        Allergies:        Allergies   Allergen Reactions   . Black [employer]  Other        Medications:        Current Medications          Current Outpatient Medications   Medication Sig Dispense Refill   . albuterol 108 (90 Base) MCG/ACT inhaler Inhale 2  puffs by mouth every 6 hours as needed for Wheezing. 3 each 3   . Amikacin Sulfate Liposome (ARIKAYCE) 590 MG/8.4ML SUSP 590 mg by Nebulization route daily. 252 mL 11   . atorvastatin (LIPITOR) 10 MG tablet Take 10 mg by mouth daily.     [name] azithromycin (ZITHROMAX) 250 MG tablet Take 1 tablet (250 mg) by mouth daily. 30 tablet 11   . ethambutol (MYAMBUTOL) 400 MG tablet Take 2 tablets (800 mg) by mouth daily. 60 tablet 11   . ferrous sulfate 325 (65 Fe) MG tablet Take 1 tablet (325 mg) by mouth 3 times daily.     [name] FLUOXETINE HCL PO 30 mg     . metFORMIN (GLUCOPHAGE) 500 mg tablet Take 1 tablet (500 mg) by mouth 2 times daily (with meals).     [name] omeprazole (PRILOSEC) 20 MG capsule Take 1 capsule (20 mg) by mouth every morning (before breakfast). 90 capsule 1   . Pediatric Multivitamins-Fl (MULTIVITAMINS/FL PO)      . ramipril (ALTACE) 2.5 MG capsule Take 2.5 mg by mouth daily.     . rifampin (RIFADIN) 300 MG capsule Take 2 capsules (600 mg) by mouth daily. 60 capsule 11   . sodium chloride 7 % NEBU 4 mL by Nebulization route every 12 hours. 240 mL 11     No current facility-administered medications for this visit.               Facility-Administered Medications Ordered in Other Visits   Medication Dose Route Frequency Provider Last  Rate Last Admin   . diphenhydrAMINE (BENADRYL) tablet 25 mg  25 mg Oral Once PRN Courtney Heys, MD              Narcotic usage: No  Anticoagulation?: No    ROS:    Complete review of systems indicated no significant findings other than those noted above.     Advanced Care Planning  What gives your life meaning?  N/A    You would be willing to endure aggressive medical therapies as long as you could still perform which activities?:   N/A    Who would make medical decisions for you if you are unable to make decisions for yourself?   N/A                Objective:   Vitals:   BP 152/87 (BP Location: Left arm, BP Patient Position: Sitting, BP cuff size: Regular)   Pulse 82   Temp 98 F (36.7 C) (Temporal)   Resp 18   Ht 5' 2.25" (1.581 m)   Wt 60.4 kg (133 lb 1.6 oz)   SpO2 99%   BMI 24.15 kg/m                ECOG Performance Scale: 0 Fully active, able to carry on all pre-disease performance without restriction  Smoking Status: not applicable (patient does not smoke)    Physical Exam:    General: healthy, alert, no distress  Skin:  Skin color, texture, turgor normal. No rashes or lesions.   Neurologic:   normal without focal findings, mental status, speech normal, alert and oriented x iii, PERLA  Neck:   normal and no adenopathy  Lymph Nodes:   Normal  Heart::  RRR. No murmurs  Lungs: clear to auscultation bilaterally  Breasts: Deferred  Abdomen:  Abdomen soft, non-tender. BS normal. No masses, organomegaly  Extremities: No lower extremity edema, calf tenderness, or asymmetric pulses; no clubbing or  cyanosis; no petechiae, purpura, or ecchymoses  GU:  Deferred  Rectal: Deferred    DIAGNOSTIC STUDIES:  The following studies were reviewed by Dr. Berniece Andreas in clinic:  CT chest 01/20/2020: images reviewed       Impression:   Tiffany Chen is a 76 year old female with a PMH of bronchiectasis, cavitary MAC lung infection.  Evaluation today indicates she is an appropriate surgical candidate.      Plan:   -Right Robotic upper lobectomy with middle lobe wedge resection today.  No changes to above.  Consent signed after all questions answered.      We had a comprehensive discussion about this diagnosis and surgical options.  The specifics of the surgery, expected benefits, risks and adverse effects specifically bleeding, infection and pneumonia, alternate treatments and the consequences of not having the surgery were discussed.

## 2021-03-20 NOTE — Anesthesia Procedure Notes (Addendum)
Arterial Line Procedure Note  Procedure: Arterial Line Insertion Date & Time: 03/20/2021 7:50 AM   Preparation: Patient was prepped and draped in usual sterile fashion  Indications:hemodynamic monitoring  Location: left radial Universal Protocol: Verbal consent obtained, risks and benefits discussed, patient states understanding of the procedure being performed, the patient's understanding of the procedure matches consent given, procedure consent matches procedure scheduled, relevant documents present and verified, test results available and properly labeled, site marked, imaging studies available, required blood products, implants, devices, and special equipment available and Immediately prior to procedure a time out was called to verify the correct patient, procedure, equipment, support staff and site/side marked as required    Consent given by: patient  Patient identity confirmed by: verbally with patient and arm band   Anesthesia:     Sedation: patient sedated   Procedure Details:     Needle Gauge: 20      Number of insertion attempts: 1  Ultrasound Guided Procedure    Post Procedure:   dressing applied    patient tolerated the procedure well with no immediate complications Comments: I, Kathlyn Sacramento, MD, was present for the entire procedure.    Kathlyn Sacramento, MD  03/20/2021 8:12 AM

## 2021-03-20 NOTE — Discharge Instructions (Signed)
[facility] Diagnosis and Reason for Admission     You were admitted to the hospital for the following reason(s):  MAI (mycobacterium avium-intracellulare) (CMS-HCC)     Your full diagnosis list is located on this After Visit Summary in the Hospital Problems section.     What Happened During Your Hospital Stay     The main tests and treatments done for you during this hospitalization were:    Lung Surgery*Robotic right upper lobectomy with en bloc lower and middle lobe wedge resection, middle lobe wedge resection      Follow up appointment with Dr. Chestine Spore via in person visit on Monday 04/02/2021 at 12:15 PM at [facility]      Instructions for After Discharge     Your diet at home should be a Resume Prior    Please take medications to ensure that you continue to have regular bowel movements while taking narcotics     Your activity level at home should be:  as much exercise or activity as you can tolerate. Try for at least 30 minutes a day. Continue to use the incentive spirometer at least 5 times daily.     Specific activity restrictions:    -Do not drive while taking narcotic pain medications.  -Do not work with heavy or complex machinery while taking narcotic pain medications.     Wound or tube care instructions:  -You may use hot and or cold packs for pain relief  -You may shower and get incision wet on Friday 3/17  -Please shower with soap and water daily. Let the soap and water run gently over your incisions, do not scrub it directly.  Pat dry.  -Take off all gauze dressings for your shower You have surgical glue on your incision.  It is ok to shower and get this wet.  It will dry up and flake off on it's on over time.  -NO tub baths, NO swimming (hot tubs/pools) for at least 2 weeks  -DO NOT apply any creams, lotions, oils, ointments to any of your wounds  -Put dry gauze or Band aid on chest tube sites daily after shower until fully closed.  -Keep area clean and dry    -Please call an MD if  you have any Fever, chills, redness, soreness, swelling or drainage from the incision. You may also send a picture via email or MyChart.  Any questions or concerns with incision care please call the surgeon's office at [phone]    Your medication list is located on this After Visit Summary in the Current Discharge Medication List section.  Your nurse will review this information with you before you leave the hospital.     It is very important for you to keep a current medication list with you in order to assist your doctors with your medical care.  Bring this After Visit Summary with you to your follow up appointments.       Reasons to Contact a Doctor Urgently     You should contact either your primary care physician or your hospital surgeon for any of the following reasons:   -Severe chest pain or new back pain after your procedure.  -Bleeding, swelling, bruising, or increased pain at the surgical site.  -Fever or increasing redness or drainage at the surgical site.  -Sudden new shortness of breath.     If you have any questions about your hospital care, your medications, or if you have new or concerning symptoms soon after  going home from the hospital, and you need to contact your hospital cardiothoracic surgeon, your hospital cardiothoracic surgeon can be contacted in the following manner:  Office phone at [phone].     Once you are able to see your primary care physician (PCP), your PCP will then be responsible for further medication refills, or appointment referrals.     What Needs to Happen Next After Discharge -- Appointments and Follow Up     Any appointments already scheduled at [facility] will be listed in the Future Appointments section at the top of this After Visit Summary.  Any appointments that have been requested, but have not yet been scheduled, will be listed below that under Post Discharge Referrals.      Medical Home Information     Your primary care provider or clinic currently on  file at [facility] is: [facility]    Handouts Given to You (if applicable)

## 2021-03-21 ENCOUNTER — Encounter (INDEPENDENT_AMBULATORY_CARE_PROVIDER_SITE_OTHER): Payer: Self-pay | Admitting: Allergy

## 2021-03-21 DIAGNOSIS — Z902 Acquired absence of lung [part of]: Secondary | ICD-10-CM

## 2021-03-21 DIAGNOSIS — Z789 Other specified health status: Secondary | ICD-10-CM

## 2021-03-21 DIAGNOSIS — A310 Pulmonary mycobacterial infection: Principal | ICD-10-CM

## 2021-03-21 LAB — GLUCOSE (POCT)
Glucose (POCT): 150 mg/dL — ABNORMAL HIGH (ref 70–99)
Glucose (POCT): 143 mg/dL — ABNORMAL HIGH (ref 70–99)
Glucose (POCT): 168 mg/dL — ABNORMAL HIGH (ref 70–99)
Glucose (POCT): 176 mg/dL — ABNORMAL HIGH (ref 70–99)
Glucose (POCT): 158 mg/dL — ABNORMAL HIGH (ref 70–99)

## 2021-03-21 MED ORDER — ETHAMBUTOL HCL 400 MG OR TABS
800.0000 mg | ORAL_TABLET | Freq: Every day | ORAL | Status: DC
Start: 2021-03-21 — End: 2021-03-22
  Administered 2021-03-21 (×2): 800 mg via ORAL
  Filled 2021-03-21: qty 2

## 2021-03-21 MED ORDER — RIFAMPIN 300 MG OR CAPS
600.0000 mg | ORAL_CAPSULE | Freq: Every day | ORAL | Status: DC
Start: 2021-03-21 — End: 2021-03-22
  Administered 2021-03-21 (×2): 600 mg via ORAL
  Filled 2021-03-21: qty 2

## 2021-03-21 MED ORDER — CLOFAZIMINE IRB 21-0063 50 MG CAPSULE
100.0000 mg | Status: DC
Start: 2021-03-21 — End: 2021-03-22
  Administered 2021-03-21 (×2): 100 mg via ORAL
  Filled 2021-03-21: qty 2

## 2021-03-21 NOTE — Progress Notes (Signed)
CT Surgery Thoracic Note     POD# 1 Day Post-Op S/P:DA VINCI XI-Right Robotic upper lobectomy with middle and lower  lobe wedge resection (Right: Chest)    PMH: Mycobacteria avium intracellulare lung infection    - Patient seen and examined, incision C/D/I, resting in bed. Feeling well overall. Has ambulated in hall many times.   - Labs reviewed  - CT: 65 cc with Serosanguinous output, no air leak, and on Waterseal  - CXR: stable, no ptx     - CV- Hemodynamically stable- NSR, BP stable   - Pulm- On o2 via nc- 2 L, sats 95%. Use I/S 10x/hr.  - Chest Tube: removed today without incident.   - Neuro - Pain controlled. May need to increase Oxy dosing to 10/15 pending control level.    Inpatient Morphine Milligram Equivalents Per Day 3/14 - 3/16    Values displayed are in units of MME/Day     Order Start / End Date Yesterday Today Tomorrow     morphine injection 2 mg 3/14 - 3/14 0 of 30 -- --     morphine injection 4 mg 3/14 - 3/14 12 of 60 -- --     oxyCODONE (ROXICODONE) tablet 5 mg 3/14 - No end date 0 of 37.5 0 of 60 0 of 60     oxyCODONE (ROXICODONE) tablet 10 mg 3/14 - No end date 45 of 75 30 of 120 0 of 120     oxyCODONE (ROXICODONE) tablet 5 mg 3/14 - 3/14 7.5 of 30 -- --     morphine injection 2 mg 3/14 - 3/14 0 of 18 -- --     oxyCODONE (OXYCONTIN) ER tablet 10 mg 3/14 - No end date 15 of 15 0 of 15 0 of 15     fentaNYL injection 3/14 - 3/14 *105 of 105 -- --     fentaNYL injection 25 mcg 3/14 - 3/14 0 of 120 -- --     fentaNYL injection 50 mcg 3/14 - 3/14 30 of 240 -- --     HYDROmorphone (DILAUDID) injection 0.5 mg 3/14 - 3/14 15 of 120 -- --     meperidine (DEMEROL) injection 12.5 mg 3/14 - 3/14 0 of 18.75 -- --     HYDROmorphone (DILAUDID) injection 0.5 mg 3/14 - 3/14 0 of 7.5 -- --     HYDROmorphone (DILAUDID) injection 0.5 mg 3/14 - 3/15 7.5 of 7.5 15 of 37.5 --     Daily Totals  * 237 of 884.25 45 of 232.5 0 of 195   *One-Step medication        - GI - Bowel protocol, last BM PTA  - Renal - Good UO. No  foley. Voiding spontaneously.   - ID- No issues, afebrile, on MAC meds    - Ambulate at minimum 3 times daily in hallway. PT/OT. Up to chair for all meals.   - DVT prophylaxis: subq heparin     LDA Status:  Patient Lines/Drains/Airways Status     Active PICC Line / CVC Line / PIV Line / Drain / Airway / Intraosseous Line / Epidural Line / ART Line / Line Type     Name Placement date Placement time Site Days    Peripheral IV - 20 G Right Forearm 03/20/21  --  Forearm  1    Peripheral IV - 18 G Right Hand 03/20/21  0737  Hand  1    Chest Tube -  Right Pleural 03/20/21  0855  Pleural  1    Colostomy -  LLQ 03/20/21  --  LLQ  1                - Disposition- CT removed today without incident. Pain control. Ambulate TID. Anticipate D/C home tomorrow pending progress.     Tiffany Chen Alyona Romack   03/21/21 11:15 AM

## 2021-03-21 NOTE — Plan of Care (Signed)
Problem: Promotion of Health and Safety  Goal: Promotion of Health and Safety  Description: The patient remains safe, receives appropriate treatment and achieves optimal outcomes (physically, psychosocially, and spiritually) within the limitations of the disease process by discharge.    Information below is the current care plan.  Outcome: Progressing  Flowsheets  Taken 03/21/2021 1456 by Leola Brazil, RN  Guidelines: Inpatient Nursing Guidelines  Individualized Interventions/Recommendations #1: Monitor vitals and telemetry  Individualized Interventions/Recommendations #2 (if applicable): Monitor right chest drsg for drainage  Individualized Interventions/Recommendations #3 (if applicable): Enc IS q1hr  Individualized Interventions/Recommendations #4 (if applicable): Enc sitting up in chair for all meals.  Individualized Interventions/Recommendations #5 (if applicable): Monitor pain level and off pain medications per [facility]  Outcome Evaluation (rationale for progressing/not progressing) every shift: Pt transfered to 314. Pt oriented to room and plan of care. pt ate lunch and tolerated well. Oxycodone given to keep pain at tolerable level. Pt instructed to call when needing to ambulate to [facility]. bed alarm on for satety.  Taken 03/21/2021 0800 by Rhetta Mura, RN  Patient /Family stated Goal: pain control  Note:

## 2021-03-21 NOTE — Interdisciplinary (Signed)
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 Cavitary lesion of lung 07/24/2020   . DM (diabetes mellitus) (CMS-HCC)    . 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      Past Surgical History:   Procedure Laterality Date   . BRONCHOSCOPY  12/2020        OT Acute     Row Name 03/21/21 0900          Type of Visit    Type of Occupational Therapy note Occupational Therapy Evaluation and Discharge     [place] Name 03/21/21 0900          Treatment Time    Treatment Start Time 0740     Total TIMED Treatment (min) 30     Total Treatment Time (min) 30     Row Name 03/21/21 0900          Treatment Precautions/Restrictions    Precautions/Restrictions Postsurgical/procedural;Fall  Chest tube     Fall Socks/charm     Row Name 03/21/21 0900          Medical History    History of presenting condition Tiffany Chen is a 76 year old female with a PMH of bronchiectasis, cavitary MAC lung infection, ruptured diverticulitis S/P colostomy in 2009, diabetes, ICH S/P embolization now S/P  Robotic right upper lobectomy with en bloc lower and middle lobe wedge resection, middle lobe wedge resection, intercostal nerve blocks.     Fall history No falls reported in the last 6 months     Row Name 03/21/21 0900          Functional History    Prior Level of Function No deficits     General ADL/Self-Care Assistance Needs None- Independent with ADLs and self care     Equipment required for mobility in the home None     Row Name 03/21/21 0900          Social History    Living Situation Lives with family     [place] accessibility Stairs present     Number of steps to enter home 14     Number of steps within home 0     Bathroom accessibility Tub/shower     Other Social History Information Pt lives with brother, who is capable of providing  directr supervision/assist PRN upon DC home     [place] Name 03/21/21 0900          Subjective    Subjective information "I am feeling great"     Patient status Patient agreeable to treatment;Nursing in agreement for treatment     [place] Name 03/21/21 0900          Pain Assessment    Pain Asssessment Tool Numeric Pain Rating Scale     Row Name 03/21/21 0900          Numeric Pain Rating Scale    Pain Intensity - rating at present 1     Pain Intensity- rating after treatment 1     Location surgical incision     Row Name 03/21/21 0900          Activities of Daily Living (ADLs)    Self Grooming Independent     Upper Body Dressing Independent     Lower Body Dressing Modified independent     Other Lower Body Dressing Information figure [place] Name 03/21/21 0900          [place] AM-PAC: Daily Activity    Assistance Needed to Put on and Take off Regular Lower Body Clothing 4     Assistance Needed to Bathe, Including Washing, Rinsing, and Drying 3     Assistance Needed to Toilet Environmental manager, Bedpan, or Urinal) 4     Assistance Needed to Put on and Take off Regular Upper Body Clothing 4     Assistance  Needed to Take Care of Personal Grooming Such as Brushing Teeth 4     Assistance Needed to Eat Meals 4     AM-PAC Daily Activity Total Score 23     AMP-PAC Daily Activity Impairment rating Score 23 - 1-19% impaired     Row Name 03/21/21 0900          Objective    Overall Cognitive Status Intact - no cognitive limitations or impairments noted     Communication No communication limitations or impairments noted. Current status of hearing, speech and vision allow functional communication.     Coordination/Motor control No limitations or impairments noted. Movement patterns are fluid and coordinated throughout     Balance Balance limitations present     Static Sitting Balance Normal - able to maintain steady balance without handhold support     Dynamic Sitting Balance Good - accepts moderate challenge, able to maintain balance while picking object off floor     Static Standing Balance Good - able to maintain balance without handhold support, limited postural sway     Dynamic Standing Balance Good - accepts moderate challenge, able to maintain balance while picking object off floor     Extremity Assessment Flexibility, strength, muscle tone and sensation grossly within functional limits throughout     Functional Mobility Functional mobility deficits present     Bed Mobility Modified independent     Transfers to/from Stand Independent     Ambulation during functional tasks Independent     Device used for ambulation/mobility None     Other Objective Findings OT orders received chart reviewed. RN and pt agreeable, seen for OT eval. Reviewed post op sx precautions while performing functional tasks. Pt performs basic OOB ADLs at baseline. Discussed DC form acute OT intervention, pt acknowledged + agreeable. Returned to supine with needs in reach at end  of tx. Tolerated well.                OT Acute Tool Box     Row Name 03/21/21 0900          Cognition Assessment    Overall Cognitive Status Intact - no cognitive limitations or  impairments noted                    Eval cont.     [place] Name 03/21/21 0900          Patient/Family Education    Learner(s) Patient     Learner response to rehab patient education interventions Verbalizes understanding;Able to return demonstrate teaching     [place] Name 03/21/21 0900          Assessment    Assessment 55F S/P Robotic right upper lobectomy with en bloc lower and middle lobe wedge resection, middle lobe wedge resection, intercostal nerve blocks. Pt presents at functional baseline performing basic OOB ADLs. No further acute OT intervention indicated at this time. Recommendations- Anticipate DC home with family when medically cleared.        [place] Name 03/21/21 0900          Treatment Plan Disussion    Treatment Plan Discussion and Agreement Patient support system determined and all questions were asked and answered     [place] Name 03/21/21 0900          Treatment Plan    Frequency of treatment Patient appropriate for discharge from therapy     Duration of treatment (number of visits) One time only, further treatment not indicated     Status of treatment One time only treatment, further skilled therapy not indicated;Patient appropriate for discharge from therapy     Row Name 03/21/21 0900          Patient Safety Considerations    Patient safety considerations Patient returned to bed at end of treatment;Call light left in reach and fall precautions in place;Patient left  in appropriate pressure relieving position     Patient assistive device requirements for safe ambulation No device required     Row Name 03/21/21 0900          Post Acute Discharge Recommendations    Discharge Rehabilitation Recommendations If available, recommend discharge to supervised living situation     Level of Care  Home with assitance     Current Level of Assistance  Independent     Equipment recommendations No equipment needed - patient has own equipment     [place] Name 03/21/21 0900          Patient Mobilization Recommendations (as  tolerated)     Toileting Patient able to use bathroom     Ambulation Patient to ambulate with nursing two times per day shift, once per night shift     Out of bed Patient out of bed to chair for all meals     Device  None needed     Safe patient handling equipment  None needed     [place] Name 03/21/21 0900          Therapy Plan Communication    Therapy Plan Communication Discussed therapy plan with Nursing and/or Physician     [place] Name 03/21/21 0900          Occupational Therapy Patient Discharge Instructions    Your Occupational Therapist suggests the following Continue to follow your prescribed mobility precautions when transferring to the chair and toilet  as instructed;Continue to complete your self care Activities of Daily Living as frequently as possible;Supervision is suggested when you     Supervision is suggested when you bath     [place] Name 03/21/21 0900          Type of Eval    Low Complexity [phone]) Completed     Row Name 03/21/21 0900          Therapeutic Procedures    Self-Care/ADL Training [phone]) Activities of daily living training;Patient education;Self-care activities of dally living         Total TIMED Treatment (min) 30                 The occupational therapist of record is endorsed by evaluating occupational therapist.

## 2021-03-21 NOTE — Progress Notes (Signed)
[facility] ICU PROGRESS NOTE    POD 1 Day Post-Op S/P:DA VINCI XI-Right Robotic upper lobectomy with middle and lower  lobe wedge resection (Right: Chest)      HPI: Tiffany Chen is a82 year oldfemale with a PMH of bronchiectasis, cavitary MAC lung infection, ruptured diverticulitis S/P colostomy in 2009, diabetes, ICH S/P embolization now S/P Robotic right upper lobectomy with en bloc lower and middle lobe wedge resection, middle lobe wedge resection, intercostal nerve blocks.    Op Course:   See formal Op note  Fluids/Blood Products:       IV Fluids: 1L LR    Blood Products: none    EBL: 81m     Urine Output: not recorded     Events:   Pain controlled overnight. Small airleak on chest tube. Using IS, pulling about 750. Patient ambulatory.     Past Medical and Surgical History:  Past Medical History:   Diagnosis Date   . Cavitary lesion of lung 07/24/2020   . DM (diabetes mellitus) (CMS-HCC)    . 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     Past Surgical History:   Procedure Laterality Date   . BRONCHOSCOPY  12/2020       Labs  Reviewed     Imaging   03/20/2021: Right-sided large-bore chest tube with no evidence of pneumothorax. Status post right upper lobectomy    Physical Exam:  General: NAD  Neuro/Mental Status: alert and oriented x 3, follows commands, PERRL, sensation and strength grossly normal  Chest: Chest tube to waterseal, minimal airleak, serosang output  CV: regular rate and rhythm  Pulm: clear to auscultation bilaterally, symmetric chest expansion, no respiratory distress   Abdomen: Non-distended, soft, colostomy in place  INCISION: covered with gauze   Extremities: no edema, pulses  2+    ASSESSMENT/PLAN:  Tiffany Shimais a89year oldfemale with a PMH of bronchiectasis, cavitary MAC lung infection, ruptured diverticulitis S/P colostomy in 2009, diabetes, ICH S/P embolization now S/P Robotic right upper lobectomy with en bloc lower and middle lobe wedge resection, middle lobe wedge resection, intercostal nerve blocks.    #Acute post operative pain:   - Multimodal pain control  - oxy prn    #Post op Hypovolemia  - IVF resuscitation as indicated     #Post-Op hypoxemia  - wean supplemental O2 to maintain SpO2>92%  - incentive spirometer 10x/hr while awake    #HTN/HLD   - atorvastatin   - ramipril with high holding parameters     #DM   - Insulin sliding scale while in patient   - hold metformin     #lobectomy and middle/lower lobe resection for MAC  - continue home amikacin, clofazimine, anf azithro  - continue ethambutol and rifampin   - Chest tube to water seal  - Keep fluid status net negative to avoid pulmonary edema   - Wean supplemental O2 to SpO2>92%  - aggressive incentive spirometry and pulmonary hygiene    - strict aspiration precautions  - monitor for expanding subcutaneous emphysema       - Diet: Diet Therapeutic; Standard Carbohydrate Limited  Nutritional Supplement - Oral ERAS: Gum; Deliver Supplements: TID Nurse to discontinue order once patient has had  a bowel movement   - Up and OOB when surgically cleared, ambulate >3 times / day, up and OOB for meals   - DVT Prophylaxis: SCDs, heparin SQ    Dispo: per thoracic surgery    Ventura Sellers PA-C  ACCM/RRU/SCCM   Pager [phone]    ACCM Attending Note  Patient seen, examined, all labs and data reviewed in detail, chart reviewed, all medications reviewed.       Assessment and Plan:  Pt seen, doing well plan as above  --------------------------------------------  Wilfrid Lund, MD, PhD, MD  [facility]  Attending  Pager [phone]

## 2021-03-21 NOTE — Interdisciplinary (Signed)
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)     ICD-10-CM: A31.0  ICD-9-CM: 031.0    Relevant Orders    Pathology Tissue Exam (Completed)    Pathology Tissue Exam (Completed)    Pathology Tissue Exam (Completed)    Pathology Tissue Exam (Completed)    Pathology Tissue Exam (Completed)    Pathology Tissue Exam (Completed)    Consult/Referral to Primary Care    Decreased activities of daily living (ADL)     ICD-10-CM: Z78.9  ICD-9-CM: V49.89    Impaired functional mobility and endurance     ICD-10-CM: Z74.09  ICD-9-CM: V49.89          IP Start of Service   Start of Care: 03/21/21  Onset Date: 03/20/21  Reason for referral: Activity tolerance limitation    Preferred Language:English         Past Medical History:   Diagnosis Date   . Cavitary lesion of lung 07/24/2020   . DM (diabetes mellitus) (CMS-HCC)    . 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      Past Surgical History:   Procedure Laterality Date   . BRONCHOSCOPY  12/2020        PT Acute     [place] Name 03/21/21 1315          Type of Visit    Type of Physical Therapy note Physical Therapy Evaluation and Discharge     [place] Name 03/21/21 1315          Treatment Precautions/Restrictions    Precautions/Restrictions Fall;Multiple lines;Postsurgical/procedural     Fall Socks/charm     Other Precautions/Restrictions Information monitor O2 Sats     [place] Name 03/21/21 1315          Medical History    History of presenting condition Per chart, "Vi Biddinger is a 76 year old female with a PMH of  bronchiectasis, cavitary MAC lung infection, ruptured diverticulitis S/P colostomy in 2009, diabetes, ICH S/P embolization now S/P Robotic right upper lobectomy with en bloc lower and middle lobe wedge resection, middle lobe wedge resection, intercostal nerve blocks."     Fall history No falls reported in the last 6 months     [place] Name 03/21/21 1315          Functional History    Prior Level of Function Minimal deficits     Equipment required for mobility in the home None     Other Functional History Information Pt reports some difficulties with endurance and SOB PTA.     [place] Name 03/21/21 1315          Social History    Living Situation Lives with parent/family     [place] present     Number of steps to enter home 14     Number of steps within home 0     Other Social History Information Pt lives with brother in an apt in [place] with 14  STE.     [place] Name 03/21/21 1315          Subjective    Subjective Information Pt and RN agreeable to visit.  Pt found and left resting in bed, denies pain, lightheadedness or dizziness.  Mod SOB with activity on room air with desat to 87% at end of ambulation.  Recovered quickly with seated/supine rest.     Patient status Patient agreeable to treatment;Nursing in agreement for treatment;Patient pain control adequate to participate in therapy     [place] Name 03/21/21 1315          Pain Assessment    Pain Asssessment Tool Numeric Pain Rating Scale     Row Name 03/21/21 1315          Numeric Pain Rating Scale    Pain Intensity - rating at present 0     Pain Intensity- rating after treatment 0     Location denies     [place] Name 03/21/21 1315          Objective    Overall Cognitive Status Intact - no cognitive limitations or impairments noted     Communication No communication limitations or impairments noted. Current status of hearing, speech and vision allow functional communication.     Coordination/Motor control No limitations or impairments  noted. Movement patterns are fluid and coordinated throughout     Balance No balance limitations or impairments noted. Patient is able to maintain balance during mobility skills and daily activities.     Extremity Assessment Flexibility, strength, muscle tone and sensation grossly within functional limits throughout     Functional Mobility No limitations or impairments in functional mobility noted. Patient independent in mobility activities of daily living     Other Objective Findings Focus of visit on pt education on pacing, rests, longer and sustained walks for improved aerobic capacity with rests when SOB or symptomatic.                      Eval cont.     [place] Name 03/21/21 [place]: Basic Mobility    Assistance Needed to Turn from Back to Side While in a Flat Bed Without Using Bedrails 4 - None (independent)     Difficulty with Supine to Sit Transfer 4 - None (independent)     How Much Help Needed to Move to/from Bed to Chair 4 - None (independent)     Difficulty with Sit to Stand Transfer from Chair with Arms 4 - None (independent)     How Much Help Needed to Walk in Room 4 - None (independent)     How Much Help Needed to Climb 3-5 Steps with a Rail 4 - None (independent)     AMPAC Total Score 24     Assessment: AM-PAC Basic Mobility Impairment Rating Score 24 - 0% impaired     [place] Name 03/21/21 1315          Patient/Family Education    Learner(s) Patient     Learner response to rehab patient education interventions Verbalizes understanding;Able to return demonstrate teaching     Patient/family training comments PT POC, discharge [place] Name 03/21/21 1315          Assessment    Assessment Pt seen this date for initial physical therapy evaluation and discharge.  Pt tolerated visit fairly well but was limited by decreased aerobic capacity with  O2 desat down to 87% at end of ambulation/stairs on room air with mod SOB that pt recovered fairly easily from with seated/supine rest over 1-3  minutes.  Pt vitals at end of ambulation: 81 bpm, 21 RR, 87%.  Pt demonstrated independence without AD for bed mobility, transfers, gait at least 400 feet x 1 and up and down 10 steps with one rail.  Pt will likely require home O2 unless O2 sats improve with ambulation.  But does not require further inpt skilled PT at this time and is discharged but may benefit from outpatient pulmonary rehab upon discharge.  Pt does not have any DME needs.  Recommend home with out DME and referral to outpatient pulmonary rehab.     Rehab Potential Good     [place] Name 03/21/21 1315          Patient stated Goal    Patient stated goal Get better, return home, rest     [place] Name 03/21/21 1315          Treatment Plan Disussion    Treatment Plan Discussion and Agreement Patient support system determined and all questions were asked and answered;Patient/family/caregiver stated understanding and agreement with the therapy plan     [place] Name 03/21/21 1315          Treatment Plan    Frequency of treatment Patient appropriate for discharge from therapy     Duration of treatment (number of visits) One time only, further treatment not indicated     Status of treatment One time only treatment, further skilled therapy not indicated;Patient appropriate for discharge from therapy     [place] Name 03/21/21 1315          Patient Safety Considerations    Patient safety considerations Patient returned to bed at end of treatment;Call light left in reach and fall precautions in place;Nursing notified of safety considerations at end of treatment     Patient assistive device requirements for safe ambulation No device required     Other Patient Safety Considerations Information monitor O2 sats with ambulation     [place] Name 03/21/21 1315          Therapy Plan Communication    Therapy Plan Communication Discussed therapy plan with Nursing and/or Physician;Encouraged out of bed with assistance by     Encouraged out of bed with assistance by Nursing;Staff     [place] Name  03/21/21 1315          Type of Eval    Low Complexity (71245) Completed     [place] Name 03/21/21 1315          Therapeutic Procedures    Gait Training [phone]) Patient education;Postural alighnment/biomechanic training during gait;Stair/curb/obstacle navigation training        Total TIMED Treatment (min)  15     [place] Name 03/21/21 1315          Treatment Time     Total TIMED Treatment  (min) 15     Total Treatment Time (min) 30     Treatment start time 1026               Post Acute Discharge Recommendations  Discharge Rehabilitation Recommendations : If available, recommend discharge to supervised living situation;If available, patient would benefit from ongoing therapy  Level of Care : Other (Comment) (reommend outpatient pulmonary rehab)  Therapy type : Outpatient (outpatient pulmonary rehab)  Therapy Justification: Other (Comment) (decreased aerobic capacity)  Current Level of Assistance : Independent  Other Post Acute Discharge Recommendations : reommend home with referral to outpatient pulmonary rehab.  No DME needs.  Equipment recommendations: No equipment needed - patient has own equipment    The physical therapist of record is endorsed by evaluating physical therapist.

## 2021-03-21 NOTE — Plan of Care (Signed)
Problem: Promotion of Health and Safety  Goal: Promotion of Health and Safety  Description: The patient remains safe, receives appropriate treatment and achieves optimal outcomes (physically, psychosocially, and spiritually) within the limitations of the disease process by discharge.    Information below is the current care plan.  Outcome: Progressing  Flowsheets  Taken 03/21/2021 0516  Individualized Interventions/Recommendations #1: pt reports tolerable level of pain  Taken 03/20/2021 2000  Patient /Family stated Goal: tolerable pain level  Note:

## 2021-03-22 ENCOUNTER — Other Ambulatory Visit: Payer: Self-pay

## 2021-03-22 LAB — GLUCOSE (POCT)
Glucose (POCT): 121 mg/dL — ABNORMAL HIGH (ref 70–99)
Glucose (POCT): 147 mg/dL — ABNORMAL HIGH (ref 70–99)
Glucose (POCT): 178 mg/dL — ABNORMAL HIGH (ref 70–99)

## 2021-03-22 MED ORDER — ACETAMINOPHEN 325 MG PO TABS
975.0000 mg | ORAL_TABLET | Freq: Three times a day (TID) | ORAL | 0 refills | Status: DC
Start: 2021-03-22 — End: 2021-06-19
  Filled 2021-03-22: qty 100, 11d supply, fill #0

## 2021-03-22 MED ORDER — OXYCODONE HCL 5 MG OR TABS
ORAL_TABLET | ORAL | 0 refills | Status: DC
Start: 2021-03-22 — End: 2021-06-19
  Filled 2021-03-22: qty 40, 4d supply, fill #0

## 2021-03-22 MED ORDER — SENNA 8.6 MG OR TABS
17.2000 mg | ORAL_TABLET | Freq: Every day | ORAL | 0 refills | Status: DC | PRN
Start: 2021-03-22 — End: 2021-06-19
  Filled 2021-03-22: qty 20, 10d supply, fill #0

## 2021-03-22 NOTE — Plan of Care (Signed)
Problem: Promotion of Health and Safety  Goal: Promotion of Health and Safety  Description: The patient remains safe, receives appropriate treatment and achieves optimal outcomes (physically, psychosocially, and spiritually) within the limitations of the disease process by discharge.    Information below is the current care plan.  Outcome: Discharged  Flowsheets  Taken 03/22/2021 1420  Guidelines: Inpatient Nursing Guidelines  Individualized Interventions/Recommendations #1: Monitor vitals and telemetry  Individualized Interventions/Recommendations #2 (if applicable): monitor right chest incision for bleeding and infection  Individualized Interventions/Recommendations #3 (if applicable): Enc Pt to use IS q1hr  Individualized Interventions/Recommendations #4 (if applicable): Enc ambulation in halls and sitting up for all meals.  Outcome Evaluation (rationale for progressing/not progressing) every shift: pt alert et oriented x4. Pt denied pain after having pain medication. Vital signs stable. Pt weaned off O2. O2 sat 92-93% on RA. Pt walking around halls and tolerating well. Pt able to be discharged. Pt instructed on discharge instructions. All questions answered. Pt waiting for brother to pick up at 2pm. Brother arrived ar 1pm. Pt taken down to car with all belongings and pt's home medications.  Taken 03/22/2021 0819  Patient /Family stated Goal: to go home  Note:

## 2021-03-22 NOTE — Plan of Care (Signed)
Problem: Promotion of Health and Safety  Goal: Promotion of Health and Safety  Description: The patient remains safe, receives appropriate treatment and achieves optimal outcomes (physically, psychosocially, and spiritually) within the limitations of the disease process by discharge.    Information below is the current care plan.  03/22/2021 0350 by Jodi Mourning, RN  Outcome: Progressing  Flowsheets  Taken 03/22/2021 0343 by Jodi Mourning, RN  Individualized Interventions/Recommendations #1: Monitor vitals and tele.  Individualized Interventions/Recommendations #2 (if applicable): Monitor R chest incision for bleeding .  Individualized Interventions/Recommendations #3 (if applicable): Monitor respiratory status. Encouraged to use IS more often .  Individualized Interventions/Recommendations #4 (if applicable): Provide a quiet and restful environment . Cluster care to promote rest.  Outcome Evaluation (rationale for progressing/not progressing) every shift: Pt A/O  x 4, ambulatory with steady gait. VSS stable  With right chest incisions one is OTA clean and dry and CT site with  dressing c/d/i . Pateint on 1LNC , instructed to do IS x 10 every while awake and not in pain and encouraged to increase activity .  Made aware of available PRN pain meds and scheduled one. Oxycodone 10 mg po given for incisional pain. Will continue to monitor.  Taken 03/21/2021 1950 by Jodi Mourning, RN  Patient /Family stated Goal: go home  Taken 03/21/2021 1456 by Silvana Newness, RN  Guidelines: Inpatient Nursing Guidelines  Individualized Interventions/Recommendations #5 (if applicable): Monitor pain level and off pain medications per [facility]  Note:          03/22/2021 0328 by Jodi Mourning, RN  Outcome: Progressing  Note:

## 2021-03-22 NOTE — Interdisciplinary (Signed)
03/22/21 0859   Discharge Plan   CM Discharge Arrangements Complete Yes   Living Arrangements on Admission* Family Member   Patient Engaged in Discharge Planning * Yes   Others Engaged in Discharge Plan: Name, Relationship and Phone Number SELF   Family/Caregiver's Assessed for * Not Applicable   Respite Care * Not Applicable   Patient/Family/Other Are In Agreement With Discharge Plan * Yes   Public Health Clearance Needed * Not Applicable   Discharge Planning Needs   Does this patient have CM discharge planning needs? No   Final Discharge Transportation   Date 03/22/21   Discharge Transportation Details *  FAMILY WILL PROVIDE TRANSPORTATION     PT TO [facility] TODAY, BROTHER WILL PROVIDE TRANSPORTATION, NO CM NEEDS.

## 2021-03-22 NOTE — Discharge Summary (Signed)
[place] [facility]  Discharge Summary    Patient Name:  Tiffany Chen    Date of Admission:  03/20/21  Date of Surgery: 03/20/21  Date of Discharge:  03/22/21    Principal Diagnosis (required):  MAI (mycobacterium avium-intracellulare) (CMS-HCC)    Hospital Problem List (required):  Active Hospital Problems    Diagnosis    *MAI (mycobacterium avium-intracellulare) (CMS-HCC) [A31.0]    Mycobacterium avium complex (CMS-HCC) [A31.0]    s/p Robotic right upper lobectomy with en bloc lower and middle lobe wedge resection, middle lobe wedge resection [Z90.2]      Resolved Hospital Problems   No resolved problems to display.       Additional Hospital Diagnoses ("rule out" or "suspected" diagnoses, etc.):  None    Principal Procedure Performed During This Hospitalization (required):  Robotic right upper lobectomy with en bloc lower and middle lobe wedge resection, middle lobe wedge resection, intercostal nerve blocks    Other Procedures Performed During This Hospitalization (required):  None    Procedure results are available in Chart Review in Epic.  For those providers external to [facility], the key procedure results are listed below:  Pre-Op Diagnosis: Mycobacteria avium intracellulare lung infection    Post-Op Diagnosis: Same    Procedure: Robotic right upper lobectomy with en bloc lower and middle lobe wedge resection, middle lobe wedge resection, intercostal nerve blocks    Surgeon: Mina Marble MD    Asst: Jolyne Loa PA-C    Brief Clinical Note: [name] has MAI mostly confined to the right upper lobe.  She also has a peripheral abscess in the right middle lobe.  She is brought to the OR for the above procedures to remove infection and prevent spread to the healthy lung.    Brief Description of Procedure: After informed consent was obtained, the patient was brought to the OR and placed in a supine position. General anesthesia was smoothly induced with a dual-lumen endotracheal tube placed. She  was placed in a left lateral decubitus position, and her right chest was prepped and draped into a sterile field. In the 7th ICS just anterior to the posterior axillary line, an 68m incision was made after administration of Exparel, and dissection was carried into the chest. A 874mrobot port was placed, and insufflation was begun with CO2 with pressure and flow at 10cm. A handsbreadth above and anterior to the camera incision, a 1225mobot port was placed. A handsbreadth posterior and inferior to the camera incision, a 79m73mbotic port was placed. A handbreadth posterior to this in the same ICS, another 8mm 20mot port was placed. Finally, a 79mm 36meal port was placed just above the diaphragm between the camera and left-hand ports in the same ICS. Please note that each incisional intercostal space was blocked with local before incision and port placement. Intercostal nerve blocks were placed posteriorly from T3-T10.  The robot was then brought to the field and docked. The superior segment was plastered to the upper lobe in the fissure.  Green loads of the robotic stapler were used to wedge a small portion of superior segment of lower lobe above the fissure.  The fissure was then explored, and 2 level 11 nodes were dissected from the ongoing PA.  The upper and middle lobes were retracted posteriorly, and the pleura overlying the anterior hilum was dissected. The middle lobe vein was identified, and the upper lobe vein was dissected free and taken with a white load of the robotic stapler. This  brought a level 11 lymph node packet. Level 11 was dissected, and careful dissection was performed to create a plane between the bronchus and the truncus. The truncus was taken with a white load. The bronchus was then dissected and taken with a green load.The fissure was then taken with multiple 18m green loads with a small involved middle lobe wedge included. A separate middle lobe wedge containing the additional abscess was  performed with green loads.  The lobectomy specimen was placed in a large bag and brought out through the access incision. The robot was dedocked after all instruments were removed.  Meticulous hemostasis was achieved, and a 24 Fr CT was placed through the camera incision after all instruments and ports were removed under direct vision. The lung was reinflated under direct vision of the camera through the access incision. All incisions were closed with multiple layers of absorbable suture.  Skin glue and sterile dressings were applied.  The patient was transported to the PTU in stable condition after extubation in the OR.    EBL: 40cc    Fluids: Per anesthesia record    Complications: None    Consultations Obtained During This Hospitalization:  None    Key consultant recommendations:  N/A    Reason for Admission to the Hospital / Initial Presentation:  MMatha Masseis a89year oldfemale with a PMH of bronchiectasis, cavitary MAC lung infection, ruptured diverticulitis S/P colostomy in 2009, diabetes, ICH S/P embolization. Smoking status:never smoker.History of radiation unrelated to lesion in question: no.    Shenotesweight loss and poor appetite. Symptoms have been present over the pastfew monthsand have been persistent.Shehashad unintentional weight loss (~30 lbs in the past 6 months).Shedenies any current fever, SOB/DOE, cough, chest pain, wheezing, and hemoptysis. Sheisable to climb 2 flights of stairs without difficulty. Recently walked up 5 flights of stairs without difficulty.    Work-up thus far has includedCT chest.   PFTs:None  Cardiac eval:None    Hospital Course by Problem (required):  Patient was admitted for surgery. The patient underwent the above described procedure with success and was transferred to the PTU for recovery. The chest tube was removed when there was no airleak and drainage subsided POD1. The patient was weaned off all oxygen. The patient tolerated a PO  diet. The patient ambulated well around the step down unit. The patient is stable and ready for discharge.      Tests Outstanding at Discharge Requiring Follow Up:  Pathology evaluation of lung    Discharge Condition (required):  Stable.    Key Physical Exam Findings at Discharge:  Physical examination is significant for:  right robotic incisions.    Temperature:  [98.1 F (36.7 C)-100.3 F (37.9 C)] 98.3 F (36.8 C) (03/16 0819)  Blood pressure (BP): (114-135)/(60-75) 122/65 (03/16 0819)  Heart Rate:  [76-102] 85 (03/16 0924)  Respirations:  [16-24] 24 (03/16 0924)  Pain Score: 0 (03/16 0819)  O2 Device: None (Room air) (03/16 0819)  O2 Flow Rate (L/min):  [1 l/min-2 l/min] 1 l/min (03/16 0333)  SpO2:  [90 %-98 %] 97 % (03/16 0924)    Date Weight Recorded 03/22/2021 03/20/2021 03/13/2021 03/13/2021 02/20/2021 02/13/2021   Metric 60.5 kg 60.192 kg 62.2 kg 62.2 kg 60.238 kg 60.1 kg   Pounds/Ounces 133 lb 6.1 oz 132 lb 11.2 oz 137 lb 2 oz 137 lb 2 oz 132 lb 12.8 oz 132 lb 7.9 oz        Discharge Diet:  Regular.  Discharge Medications:     What To Do With Your Medications      START taking these medications      Add'l Info   acetaminophen 325 MG tablet  Commonly known as: TYLENOL  Take 3 tablets (975 mg) by mouth every 8 hours for 5 days, then as needed   Quantity: 270 tablet  Refills: 0     oxyCODONE 5 MG immediate release tablet  Commonly known as: ROXICODONE  Take 1-2 tablets by mouth every 4-6 hours as needed for severe pain   Quantity: 40 tablet  Refills: 0     senna 8.6 MG tablet  Commonly known as: SENOKOT  Take 2 tablets (17.2 mg) by mouth daily as needed for Constipation. Take while taking narcotic medication   Quantity: 20 tablet  Refills: 0        CONTINUE taking these medications      Add'l Info   albuterol 108 (90 Base) MCG/ACT inhaler  Inhale 2 puffs by mouth every 6 hours as needed for Wheezing.   Quantity: 3 each  Refills: 3     Arikayce 590 MG/8.4ML Susp  590 mg by Nebulization route daily.  Generic drug:  Amikacin Sulfate Liposome   Quantity: 252 mL  Refills: 11     atorvastatin 10 MG tablet  Commonly known as: LIPITOR  Take 1 tablet (10 mg) by mouth daily.   Refills: 0     azithromycin 250 MG tablet  Commonly known as: ZITHROMAX  Take 1 tablet (250 mg) by mouth daily.   Quantity: 30 tablet  Refills: 11     CLOFAZIMINE IRB 21-0063 50 MG CAPSULE  Take 2 capsules (173m) by mouth once daily with food as directed.   Quantity: 200 capsule  Refills: 0     ethambutol 400 MG tablet  Commonly known as: MYAMBUTOL  Take 2 tablets (800 mg) by mouth daily.   Quantity: 60 tablet  Refills: 11     ferrous sulfate 325 (65 Fe) MG tablet  Take 1 tablet (325 mg) by mouth 3 times daily.   Refills: 0     FLUOXETINE HCL PO  30 mg   Refills: 0     metFORMIN 500 mg tablet  Commonly known as: GLUCOPHAGE  Take 1 tablet (500 mg) by mouth 2 times daily (with meals).   Refills: 0     MULTIVITAMINS/FL PO   Refills: 0     omeprazole 20 MG capsule  Commonly known as: PRILOSEC  Take 1 capsule (20 mg) by mouth every morning (before breakfast).   Quantity: 90 capsule  Refills: 1     ramipril 2.5 MG capsule  Commonly known as: ALTACE  Take 1 capsule (2.5 mg) by mouth daily.   Refills: 0     rifampin 300 MG capsule  Commonly known as: RIFADIN  Take 2 capsules (600 mg) by mouth daily.   Quantity: 60 capsule  Refills: 11     sodium chloride 7 % Nebu  4 mL by Nebulization route every 12 hours.   Quantity: 240 mL  Refills: 11           Where to Get Your Medications      These medications [id number]   Hours: Mon-Fri: 8:30am-7:00pm; Sat-Sun & Holidays: 9:00am-5:00pm Phone: [phone]   acetaminophen 325 MG tablet   oxyCODONE 5 MG immediate release tablet   senna 8.6 MG  tablet         Allergies:  Allergies   Allergen Reactions    [facility] Other       Discharge Disposition:  Home.    Discharge discussed with Dr. No att. providers found and agrees with plan to  discharge.    Discharge Code Status:  Full code / full care  This code status is not changed from the time of admission.    Follow Up Appointments:    Scheduled appointments:  Future Appointments   Date Time Provider [place]   04/02/2021 12:15 PM Melene Muller, MD [facility] [facility]   04/10/2021  1:00 PM [place] [facility], 4TH FLOOR [facility]   04/18/2021  8:00 AM Katheren Shams, MD [facility]   05/08/2021  1:00 PM [place] [facility], [place] [facility]   06/05/2021  1:00 PM [place] [facility], 4TH FLOOR [facility]   06/11/2021  9:00 AM Ollen Barges [place]   06/11/2021  1:00 PM Vo, Saralyn Pilar, [facility]   08/02/2021  1:15 PM Maudie Mercury Bedelia Person, MD [facility] All[facility]       For appointments requested for after discharge that have not yet been scheduled, refer to the Post Discharge Referrals section of the After Visit Summary.    Discharging 37 Contact Information:  Office phone at [phone].

## 2021-03-23 ENCOUNTER — Encounter: Payer: Self-pay | Admitting: Hospital

## 2021-03-26 ENCOUNTER — Other Ambulatory Visit: Payer: Self-pay

## 2021-03-26 LAB — EMMI , DIABETES, TYPE 2: EMMI Video Order Number: 16211913542

## 2021-03-26 LAB — AFB CULTURE W/STAIN
AFB Culture Result: NO GROWTH
AFB Smear Result: NEGATIVE

## 2021-03-28 ENCOUNTER — Other Ambulatory Visit (INDEPENDENT_AMBULATORY_CARE_PROVIDER_SITE_OTHER): Payer: Self-pay | Admitting: Gastroenterology

## 2021-03-28 DIAGNOSIS — K219 Gastro-esophageal reflux disease without esophagitis: Secondary | ICD-10-CM

## 2021-03-28 MED ORDER — OMEPRAZOLE 20 MG OR CPDR
DELAYED_RELEASE_CAPSULE | ORAL | 3 refills | Status: DC
Start: 2021-03-28 — End: 2022-07-09

## 2021-04-02 ENCOUNTER — Ambulatory Visit: Payer: Medicare Other | Attending: Thoracic Surgery | Admitting: Thoracic Surgery

## 2021-04-02 VITALS — BP 148/80 | HR 86 | Temp 97.1°F | Resp 16 | Ht 62.25 in | Wt 132.0 lb

## 2021-04-02 DIAGNOSIS — Z09 Encounter for follow-up examination after completed treatment for conditions other than malignant neoplasm: Secondary | ICD-10-CM

## 2021-04-02 DIAGNOSIS — A310 Pulmonary mycobacterial infection: Secondary | ICD-10-CM

## 2021-04-02 NOTE — Progress Notes (Signed)
[place] [facility]   Lung Return Patient Evaluation    Diagnosis: Necrotizing granulomatous inflammation     Procedure(s):  Robotic right upper lobectomy with en bloc lower and middle lobe wedge resection, middle lobe wedge resection, intercostal nerve blocks (03/20/2021)    Pathology:   03/20/2021  FINAL PATHOLOGIC DIAGNOSIS:   A: Lymph node, level 11, resection   -Benign lymph node.   B: Lymph node, level 11 #2, resection   -Benign lymph node.   C: Lymph node, level 11 #3, resection   -Non-necrotizing granulomatous inflammation negative for fungi and   acid-fast bacilli on GMS and AFB stains (block C1).   -Benign lymph node.   D: Lymph node, level 11 #4, resection   -Benign lymph node.   E: Lung, right middle lobe wedge, resection   -Non-necrotizing granulomatous inflammation negative for fungi and   acid-fast bacilli on GMS and AFB stains.(block E2)   -Negative for malignancy   F: Lung, right upper lobectomy with en bloc right middle lobe and lower lobe wedge resection   -Emphysema.   -Necrotizing granulomatous inflammation   -Acid-fast bacilli identified on AFB stain (blocks F2 and F5).   -GMS stain is negative for fungi (blocks F2 and F5)   -Negative for malignancy   SPECIMEN(S) SUBMITTED:   A: Level 11   B: Level 11 #2   C: Level 11 #3   D: Level 11 #4   E: Right middle lobe wedge   F: Right upper lobectomy with en bloc right middle lobe and lower lobe   wedge resection     Postoperative therapy:  NA    Subjective:     Current Status: Valisha Mazzuca presents to the thoracic surgery clinic for post op follow up.  Since discharge, she  has been doing well. She notes no symptoms. She denies SOB/DOE, chest pain, cough, wheezing and hemoptysis.       Objective:   Vitals:     04/02/21  1138   BP: 148/80   Pulse: 86   Resp: 16   Temp: 97.1 F (36.2 C)   SpO2: 97%     ECOG Performance Scale: 0 Fully active, able to carry on all pre-disease performance without restriction  Smoking Status:not applicable  (patient does not smoke)    Physical Exam:    General: healthy, alert, no distress  Skin:  Skin color, texture, turgor normal. No rashes or lesions. Incisions: C/D/I  Neurologic: normal without focal findings, mental status, speech normal, alert and oriented x iii  Neck:   normal  Lymph Nodes:   Normal  Heart::  RRR.   Lungs: unlabored  Breasts: Deferred  Abdomen:  Deferred  Extremities: No lower extremity edema  GU:  Deferred  Rectal: Deferred    Diagnostic studies:   The following studies were reviewed by Dr. Berniece Andreas in clinic:  None    Impression:   Torilyn Brad is a 76 year old female with a history of MAI s/p upper lobectomy. Evaluation today indicates she  Is recovering extremely well.     Plan:   Continue pulmonary follow-up.  RTC prn

## 2021-04-02 NOTE — Patient Instructions (Signed)
PLAN:  - Return to clinic as needed   - Follow up with pulmonologist     [facility] Phone List for Patients   Hours of Operation: Monday-Friday 8:00- 5:00p.m. (Closed Holidays and Weekends)   Infusion Center Hours--- Monday-Friday 7:00-7:30 p.m.; Sat-Sun 8:00-4:30pm.     AFTER HOURS/WEEKEND EMERGENCY NUMBER [phone]  Ask for the On-Call for Cardiothoracic Surgery     Physician: Dr. Chestine Spore  Administrative Assistant: Saverio Danker    Office Phone: [phone]  FAX: [phone]   Contact for forms, records, and scheduling.  Please call prior to coming in to pick up forms or letters to insure their availability; as they take 24-48 hours to prepare.  For medication refills please have your pharmacy fax a request to fax number above    Physician Assistant: Jose Persia, PA-C  Nurse Practitioner: Maylon Peppers, DNP       Team: Social Worker   LUNG: Percival Spanish, LCSW. Phone # [phone]  *For assistance in finding support groups, information regarding community resources, and any help you may need.     Insurance Information:  Geographical information systems officer: [phone]   MED CENTER Billing: [phone]   MED GROUP (Physician) Billing: [phone]   Doran He[facility] Financial Counselor: [phone] [email or web address]  This link has the insurance plans identified that [facility] accepts:   [email or web address]

## 2021-04-06 ENCOUNTER — Other Ambulatory Visit: Payer: Self-pay

## 2021-04-09 LAB — AFB CULTURE W/STAIN
AFB Culture Result: NO GROWTH
AFB Smear Result: NEGATIVE

## 2021-04-10 ENCOUNTER — Ambulatory Visit
Admission: RE | Admit: 2021-04-10 | Discharge: 2021-04-10 | Disposition: A | Discharge status: Home / Self Care | Payer: Medicare Other | Attending: Allergy | Admitting: Allergy

## 2021-04-10 VITALS — BP 126/68 | HR 85 | Temp 97.1°F | Resp 16 | Ht 62.25 in | Wt 136.0 lb

## 2021-04-10 DIAGNOSIS — D809 Immunodeficiency with predominantly antibody defects, unspecified: Secondary | ICD-10-CM | POA: Insufficient documentation

## 2021-04-10 DIAGNOSIS — A310 Pulmonary mycobacterial infection: Secondary | ICD-10-CM | POA: Insufficient documentation

## 2021-04-10 LAB — CBC WITH DIFF, BLOOD
ANC-Automated: 4 10*3/uL (ref 1.6–7.0)
ANC-Instrument: 4 10*3/uL (ref 1.6–7.0)
Abs Basophils: 0.1 10*3/uL (ref <0.2)
Abs Eosinophils: 0.2 10*3/uL (ref 0.0–0.5)
Abs Lymphs: 1.4 10*3/uL (ref 0.8–3.1)
Abs Monos: 0.8 10*3/uL (ref 0.2–0.8)
Basophils: 1 %
Eosinophils: 3 %
Hct: 40.1 % (ref 34.0–45.0)
Hgb: 12.7 gm/dL (ref 11.2–15.7)
Lymphocytes: 22 %
MCH: 27.1 pg (ref 26.0–32.0)
MCHC: 31.7 g/dL — ABNORMAL LOW (ref 32.0–36.0)
MCV: 85.5 um3 (ref 79.0–95.0)
MPV: 9.1 fL — ABNORMAL LOW (ref 9.4–12.4)
Monocytes: 13 %
Plt Count: 351 10*3/uL (ref 140–370)
RBC: 4.69 10*6/uL (ref 3.90–5.20)
RDW: 15.9 % — ABNORMAL HIGH (ref 12.0–14.0)
Segs: 61 %
WBC: 6.5 10*3/uL (ref 4.0–10.0)

## 2021-04-10 LAB — AFB CULTURE W/STAIN
AFB Smear Result: NEGATIVE
AFB Smear Result: NEGATIVE

## 2021-04-10 LAB — COMPREHENSIVE METABOLIC PANEL, BLOOD
ALT (SGPT): 24 U/L (ref 0–33)
AST (SGOT): 24 U/L (ref 0–32)
Albumin: 3.8 g/dL (ref 3.5–5.2)
Alkaline Phos: 75 U/L (ref 40–130)
Anion Gap: 12 mmol/L (ref 7–15)
BUN: 13 mg/dL (ref 8–23)
Bicarbonate: 23 mmol/L (ref 22–29)
Bilirubin, Tot: 0.22 mg/dL (ref <1.2)
Calcium: 9.3 mg/dL (ref 8.5–10.6)
Chloride: 100 mmol/L (ref 98–107)
Creatinine: 0.64 mg/dL (ref 0.51–0.95)
Glucose: 130 mg/dL — ABNORMAL HIGH (ref 70–99)
Potassium: 4.2 mmol/L (ref 3.5–5.1)
Sodium: 135 mmol/L — ABNORMAL LOW (ref 136–145)
Total Protein: 6.9 g/dL (ref 6.0–8.0)
eGFR Based on CKD-EPI 2021 Equation: >60 mL/min/{1.73_m2}

## 2021-04-10 LAB — IGG, BLOOD: IGG: 967 mg/dL (ref 700–1600)

## 2021-04-10 MED ORDER — IMMUNE GLOBULIN (HUMAN) 20 GM/200ML IJ SOLN
0.5000 g/kg | Freq: Once | INTRAMUSCULAR | Status: AC
Start: 2021-04-10 — End: 2021-04-10
  Administered 2021-04-10 (×2): 25 g via INTRAVENOUS
  Filled 2021-04-10: qty 200

## 2021-04-10 MED ORDER — DIPHENHYDRAMINE HCL 25 MG OR TABS OR CAPS CUSTOM
25.0000 mg | ORAL_CAPSULE | Freq: Once | ORAL | Status: DC | PRN
Start: 2021-04-10 — End: 2021-04-14

## 2021-04-10 MED ORDER — CETIRIZINE HCL 10 MG OR TABS
10.0000 mg | ORAL_TABLET | Freq: Once | ORAL | Status: AC
Start: 2021-04-10 — End: 2021-04-10
  Administered 2021-04-10 (×2): 10 mg via ORAL
  Filled 2021-04-10: qty 1

## 2021-04-10 MED ORDER — ACETAMINOPHEN 325 MG PO TABS
650.0000 mg | ORAL_TABLET | Freq: Once | ORAL | Status: AC
Start: 2021-04-10 — End: 2021-04-10
  Administered 2021-04-10 (×2): 650 mg via ORAL
  Filled 2021-04-10: qty 2

## 2021-04-10 MED ORDER — DEXTROSE 5 % IV SOLN
INTRAVENOUS | Status: DC
Start: 2021-04-10 — End: 2021-04-11

## 2021-04-10 NOTE — Interdisciplinary (Signed)
Non-Chemotherapy Infusion Nursing Note - Tiffany Chen is a 76 year old female who presents for infusion of IVIG 25g    Vitals:    04/10/21 1238   BP: 126/68   BP Location: Left arm   BP Patient Position: Sitting   Pulse: 85   Resp: 16   Temp: 97.1 F (36.2 C)   TempSrc: Temporal   SpO2: 98%   Weight: 61.7 kg (136 lb 0.4 oz)   Height: 5' 2.25" (1.581 m)     Pain Score: 2  Body surface area is 1.65 meters squared.  Body mass index is 24.68 kg/m.    Pre-treatment nursing assessment:  No problems identified upon assessment.    Tiffany Chen tolerated treatment well.    Post blood return: Brisk  Post-Flush: NS and Discontinued IV    Patient Education  Learner: Patient  Barriers to learning: No Barriers  Readiness to learn: Acceptance  Method: Explanation    Treatment Education: Information/teaching given to patient including: signs and symptoms of infection, bleeding, adverse reaction(s), symptom control, and when to notify MD.    Tiffany Chen Prevention Education: Instructed patient to call for assistance.    Pain Education: Patient instructed to contact nurse if pain should develop or if their current pain therapy becomes ineffective.    Response: Verbalizes understanding    Discharge Plan  Discharge instructions given to patient.  Future appointments given and reviewed with treatment plan.  Discharge Mode: Ambulatory  Discharge Time: [place]  Accompanied by: Self  Discharged To: Home     Medications   dextrose 5% infusion (has no administration in time range)   diphenhydrAMINE (BENADRYL) tablet 25 mg (has no administration in time range)   acetaminophen (TYLENOL) tablet 650 mg (650 mg Oral Given 04/10/21 1248)   cetirizine (ZYRTEC) tablet 10 mg (10 mg Oral Given 04/10/21 1248)   immune globulin (human) (IGG) 25 g in 250 mL (GAMUNEX-C) infusion (25 g IntraVENOUS New Bag 04/10/21 1332)

## 2021-04-12 ENCOUNTER — Telehealth (HOSPITAL_COMMUNITY): Payer: Self-pay | Admitting: Thoracic Surgery

## 2021-04-12 ENCOUNTER — Encounter (INDEPENDENT_AMBULATORY_CARE_PROVIDER_SITE_OTHER): Payer: Self-pay | Admitting: Allergy

## 2021-04-12 NOTE — Telephone Encounter (Signed)
Patient had surgery with Dr. Berniece Andreas on 3/14 and had her post op on 3/27 at that time he suggested that if the level of pain increased she could lake something to help quiet the never ending during the healing process. Patient is calling now to get medication he mentioned.  Please call back to discuss.

## 2021-04-13 NOTE — Telephone Encounter (Signed)
Patient's issue had been resolved

## 2021-04-16 ENCOUNTER — Other Ambulatory Visit: Payer: Self-pay

## 2021-04-18 ENCOUNTER — Ambulatory Visit: Payer: Medicare Other | Attending: Critical Care Medicine | Admitting: Critical Care Medicine

## 2021-04-18 ENCOUNTER — Encounter (HOSPITAL_BASED_OUTPATIENT_CLINIC_OR_DEPARTMENT_OTHER): Payer: Self-pay | Admitting: Critical Care Medicine

## 2021-04-18 ENCOUNTER — Other Ambulatory Visit (INDEPENDENT_AMBULATORY_CARE_PROVIDER_SITE_OTHER): Payer: Self-pay | Admitting: Critical Care Medicine

## 2021-04-18 VITALS — BP 131/59 | HR 93 | Temp 97.1°F | Resp 18 | Ht 62.5 in | Wt 133.2 lb

## 2021-04-18 DIAGNOSIS — I499 Cardiac arrhythmia, unspecified: Secondary | ICD-10-CM | POA: Insufficient documentation

## 2021-04-18 DIAGNOSIS — J984 Other disorders of lung: Secondary | ICD-10-CM | POA: Insufficient documentation

## 2021-04-18 DIAGNOSIS — A310 Pulmonary mycobacterial infection: Secondary | ICD-10-CM | POA: Insufficient documentation

## 2021-04-18 DIAGNOSIS — J471 Bronchiectasis with (acute) exacerbation: Secondary | ICD-10-CM

## 2021-04-18 DIAGNOSIS — Z902 Acquired absence of lung [part of]: Secondary | ICD-10-CM | POA: Insufficient documentation

## 2021-04-18 DIAGNOSIS — K219 Gastro-esophageal reflux disease without esophagitis: Secondary | ICD-10-CM | POA: Insufficient documentation

## 2021-04-18 NOTE — Patient Instructions (Addendum)
-  continue the azithromycin, rifampin, ethambutol, clofazimine (which was started 02/21/2021) and inhaled Arikayce  - please schedule the CT scan of the lung to be done in mid to late June (3 months after the surgery)  - call to schedule induced sputums-[phone]. Please perform 2 IN May then 2 more in June  -please have your blood drawn monthly   -schedule an EKG; call to schedule: [facility] or [facility]: [phone])  - schedule an audiogram to be done in June or July (Audiogram Department scheduling phone number: [phone])  -continue Nebulized saline but it should precede the Arikayce inhalation  - please schedule the appointment with esophageal surgery clinic to discuss possible intervention for the acid reflux  - please call to schedule the first evaluation visit with pulmonary rehabilitation  -follow up with Ledon Np in June and with me in 75months

## 2021-04-20 ENCOUNTER — Encounter (HOSPITAL_COMMUNITY): Payer: Self-pay | Admitting: Hospital

## 2021-04-23 ENCOUNTER — Encounter (INDEPENDENT_AMBULATORY_CARE_PROVIDER_SITE_OTHER): Payer: Self-pay

## 2021-04-23 ENCOUNTER — Ambulatory Visit (INDEPENDENT_AMBULATORY_CARE_PROVIDER_SITE_OTHER): Payer: Medicare Other

## 2021-04-23 VITALS — BP 127/76 | HR 86 | Temp 96.3°F | Resp 16 | Ht 62.5 in | Wt 132.0 lb

## 2021-04-23 DIAGNOSIS — F32A Depression, unspecified: Secondary | ICD-10-CM

## 2021-04-23 DIAGNOSIS — A310 Pulmonary mycobacterial infection: Secondary | ICD-10-CM

## 2021-04-23 DIAGNOSIS — Z0000 Encounter for general adult medical examination without abnormal findings: Secondary | ICD-10-CM

## 2021-04-23 DIAGNOSIS — Z13220 Encounter for screening for lipoid disorders: Secondary | ICD-10-CM

## 2021-04-23 DIAGNOSIS — Z1239 Encounter for other screening for malignant neoplasm of breast: Secondary | ICD-10-CM

## 2021-04-23 DIAGNOSIS — Z13820 Encounter for screening for osteoporosis: Secondary | ICD-10-CM

## 2021-04-23 DIAGNOSIS — E119 Type 2 diabetes mellitus without complications: Secondary | ICD-10-CM

## 2021-04-23 DIAGNOSIS — Z1159 Encounter for screening for other viral diseases: Secondary | ICD-10-CM

## 2021-04-23 DIAGNOSIS — Z8679 Personal history of other diseases of the circulatory system: Secondary | ICD-10-CM

## 2021-04-23 MED ORDER — FLUOXETINE HCL 40 MG OR CAPS
40.0000 mg | ORAL_CAPSULE | Freq: Every day | ORAL | 3 refills | Status: DC
Start: 2021-04-23 — End: 2022-02-12

## 2021-04-23 NOTE — Progress Notes (Signed)
Met with patient at the request of PCP Dr. Alonza Bogus to address her increased feelings of depression and anxiety. Patient was highly engaged and very open during the consultation. Patient reported interest in starting therapy with an [facility] provider. Reviewed patient's coping strategies, including walking, art and taking care of animals.    BHP described Integrated Behavioral Health short-term services offered at [facility] , and provided the IBH line to inquire about insurance and scheduling. Patient reported that she will reach out as soon as possible to schedule an appointment with a BHP.      Pt verbalized understanding, was very agreeable to plan, and thankful for visit.    Kizzie Fantasia, Psy.D.  Sammu[facility] Cooper [phone]

## 2021-04-23 NOTE — Patient Instructions (Addendum)
Here is what we discussed today:  - Obtain labs while fasting - this can be done at any [place] lab draw site. I will contact you on MyChart about your results.  - Increase Prozac to 40 mg daily  - Referral provided to psychiatry and behavioral health  - Schedule mammogram  - Schedule DEXA scan  - Schedule MRA brain  - Obtain tetanus booster and shingles vaccine at local pharmacy  - Schedule a follow-up appointment with me at the front desk in 3 months for [facility] Annual Wellness Visit

## 2021-04-23 NOTE — Progress Notes (Addendum)
Attending Note:    Subjective:  I reviewed the history.  Patient interviewed and examined.  History of present illness (HPI):  Establish Care and Other (Lung surgery 03/20/21 , pt is experiencing soreness and SOB , pt has a colostomy and experiencing cramps)    Patient is a 76 year old female here for establish care.  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 She will have f/u CT Chest and induced sputum and awaiting for Pulm rehab.  She has worsening depression and anxiety after losing husband 12/2018 from [place]; on Prozac 30 mg daily but has reduced appetite and increased sleeping, difficult to concentrate but denies SI.  Otherwise she has abd hernia around colostomy which is getting larger although non painful. She was told she could have reversal of colostomy but not ready for surgery at this time.  Review of Systems (ROS): As per  the resident's note.  Past Medical, Family, Social History:  As per  the resident's  note.    Objective:   I have examined the patient and I concur with the resident's exam and of note is heart is RRR w/o murmur and lung is CTA. Abdomen colonoscopy bag intact but large hernia surrounding this are but no ttp.    Assessment and plan reviewed with the resident physician.  I agree with the resident's plan as documented.  For Type 2 DM w/ good A1C=7.0 in 03/2021. Foot exam unremarkable today and will check urine microalbumin. Ophth pending 06/2021.  For hx of brain aneurysm, ICH s/p coiling and embolization, will obtain MRA Brain for surveillance. Recommend medical record for review.  For depression w/  anxiety, will refer to IBH and Psych; warm hand-off to [facility] today for counseling. Will also increase Fluoxetine from 30 mg to 40 mg daily. Recommend f/u in 1 month.  For cavitary MAC infection, cont w/ current regimen of azithromycin, rifampin, ethambutol, clofazimine (which was started 02/21/2021) and inhaled Arikayce and f/u with Pulm and GI( to evaluate acid reflux intervention).  For large abd hernia around colostomy, currently not associated w/ pain and she decline any surgical correction at this time since she is overwhelmed w/ recent move, other medical problems and mood d/o; she will let us know when she is ready. Recommend abd binder w/ hole cut for colostomy.    Will obtain screening DEXA, Screening mammo and recommend tetanus and shingles vaccine at local pharmacy. Last colonoscopy 9 years ago and reported to be negative per patient.  See the resident's note for further details and updates on healthcare maintenance.

## 2021-04-23 NOTE — Progress Notes (Signed)
[facility]  Name: Tiffany Chen   DOB: 06/12/1945    MRN: [id number]  Date of Service: 04/23/2021     Reason for Visit: Establish care    HPI:   Tiffany Chen is a 76 year old female with PMHx of bronchiectasis, cavitary MAC lung infection, ruptured diverticulitis S/P colostomy in 2009, diabetes, depression, and ICH S/P embolization, who presents to establish care.    Moved from [place] 12/2019, seeking to establish care within [place] where she is followed by pulmonology.    Cavitary MAC lung infection  - Followed by pulmonology  - Last office visit with pulm last week - induced sputum ordered, repeat CT scan ordered, GI referral re: intervention for acid reflux  - Pt continues to have shortness of breath, though not worse than prior. Awaiting to establish with pulmonary rehab    T2DM  - Currently on Metformin 500 mg BID (decreased from 1000 mg BID 11/2020)  - On Ramipril for kidney protection  - On Atorvastatin  - No dx HTN/HLD    Depression  Anxiety  - Lost husband to [place] 12/2018  - Feels likes at a "low point," describes low motivation, difficulty getting out of bed on some days  - Easily distracted, difficulty concentrating  - Low appetite, increased sleep (naps for 2 hours/daily)  - On Prozac 30 mg daily  - Protective factors: grateful for family support, uses art as creativity  - Feels like life is scheduled around her health appointments  - No SI  - Father passed from suicide    Ventral hernia  Ruptured diverticulitis S/P colostomy  - Describes chronic intermittent left-sided pain, lasts 2-3 hours at a time. 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 Pt prefers to defer at this time.   - Breast: Last in 07/2017 - due  Other Screening   - Diabetes: Hgb A1c 7.0 in 03/2021   - Lipid: Last in 02/2020   - Osteoporosis: DEXA 08/2018 with osteopenia    Patient Active Problem List:  Patient Active Problem List   Diagnosis   . Acquired tracheobronchomegaly with bronchiectasis (CMS-HCC)   . MAI (mycobacterium avium-intracellulare) (CMS-HCC)   . Cavitary lesion of lung   . Gastroesophageal reflux disease, unspecified whether esophagitis present   . Hoarse voice quality   . 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 Mycobacterium avium complex (CMS-HCC)   . s/p Robotic right upper lobectomy with en bloc lower and middle lobe wedge resection, middle lobe wedge resection     PMHx:  Past Medical History:   Diagnosis Date   . Cavitary lesion of lung 07/24/2020   . Depression    . DM (diabetes mellitus) (CMS-HCC)    . 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 s/p Robotic  right upper lobectomy with en bloc lower and middle lobe wedge resection, middle lobe wedge resection 03/20/2021    03/20/21 Robotic right upper lobectomy with en bloc lower and middle lobe wedge resection, middle lobe wedge resection ([place])     PSHx:  Past Surgical History:   Procedure Laterality Date   . BRONCHOSCOPY  12/2020   . COLOSTOMY     . LUNG LOBECTOMY        Meds:  Cannot display prior to admission medications because the patient has not been admitted in this contact.     Current Outpatient Medications   Medication Sig   . acetaminophen (TYLENOL) 325 MG tablet Take 3 tablets (975 mg) by mouth every 8 hours for 5 days, then as needed   . Amikacin Sulfate  Liposome (ARIKAYCE) 590 MG/8.4ML SUSP 590 mg by Nebulization route daily.   [name] atorvastatin (LIPITOR) 10 MG tablet Take 1 tablet (10 mg) by mouth daily.   [name] azithromycin (ZITHROMAX) 250 MG tablet Take 1 tablet (250 mg) by mouth daily.   . CLOFAZIMINE IRB [id number] 50 MG CAPSULE Take 2 capsules (159m) by mouth once daily with food as directed.   . ethambutol (MYAMBUTOL) 400 MG tablet Take 2 tablets (800 mg) by mouth daily.   . ferrous sulfate 325 (65 Fe) MG tablet Take 1 tablet (325 mg) by mouth 3 times daily.   . fluoxetine (PROZAC) 40 MG capsule Take 1 capsule (40 mg) by mouth daily.   . metFORMIN (GLUCOPHAGE) 500 mg tablet Take 1 tablet (500 mg) by mouth 2 times daily (with meals).   .Marland Kitchenomeprazole (PRILOSEC) 20 MG capsule TAKE 1 CAPSULE BY MOUTH IN  THE MORNING BEFORE  BREAKFAST   . oxyCODONE (ROXICODONE) 5 MG immediate release tablet Take 1-2 tablets by mouth every 4-6 hours as needed for severe pain   . Pediatric Multivitamins-Fl (MULTIVITAMINS/FL PO)    . ramipril (ALTACE) 2.5 MG capsule Take 1 capsule (2.5 mg) by mouth daily.   . rifampin (RIFADIN) 300 MG capsule Take 2 capsules (600 mg) by mouth daily.   .Marland Kitchensenna (SENOKOT) 8.6 MG tablet Take 2 tablets (17.2 mg) by mouth daily as needed for Constipation. Take while taking narcotic medication   . sodium chloride 7 % NEBU 4 mL by Nebulization route every 12 hours.   . VENTOLIN HFA 108 (90 Base) MCG/ACT inhaler USE 2 INHALATIONS BY MOUTH  EVERY 6 HOURS AS NEEDED FOR WHEEZING     No current facility-administered medications for this visit.     Allergies:  Allergies   Allergen Reactions   . Black [employer] Other       Social Hx:  She reports that she has never smoked. She has never used smokeless tobacco.  She reports that she does not currently use alcohol.  She reports that she does not currently use drugs.    Family Hx:  Family History   Problem Relation Name Age of Onset   . Cancer Mother APrescilla Soursngs and throat   . Heart Disease Mother AEliane Decree         Congrestive heart failure due to cancer   . Hypertension Mother AEliane Decree   . Psychiatry Mother AEliane Decree        Bi polar, manic depressive   . Cancer Father WWarren Danes        Prostate   . Diabetes Father WWarren Danes   .  Stroke Father Warren Danes    . Stroke Maternal Grandmother Winifred Jeani Hawking    . Cancer Brother Dian Situ         Skin (many locations)   . Diabetes Brother Dian Situ        ROS:  Review of Systems   All other systems reviewed and are negative.      OBJECTIVE:  BP 127/76 (BP Location: Right arm, BP Patient Position: Sitting, BP cuff size: Regular)   Pulse 86   Temp 96.3 F (35.7 C) (Temporal)   Resp 16   Ht 5' 2.5" (1.588 m)   Wt 59.9 kg (132 lb)   SpO2 98%   BMI 23.76 kg/m     Physical Exam  Constitutional:       General: She is not in acute distress.  HENT:      Head: Normocephalic and atraumatic.   Eyes:      Extraocular Movements: Extraocular movements intact.   Cardiovascular:      Rate and Rhythm: Normal rate and regular rhythm.   Pulmonary:      Effort: Pulmonary effort is normal.      Breath sounds: Normal breath sounds.   Abdominal:      General: Bowel sounds are normal.      Palpations: Abdomen is soft.      Tenderness: There is no abdominal tenderness. There is no guarding or rebound.      Comments: Colostomy bag in place, unable to visualize stoma. Large ventral hernia to left abdomen overlying site of colostomy. Not reducible. No overlying tenderness or discoloration.   Musculoskeletal:         General: No swelling or tenderness.      Cervical back: Normal range of motion and neck supple.      Comments: Bilateral feet without ulceration, discoloration, or tenderness. Sensation intact to monofilament test.   Lymphadenopathy:      Cervical: No cervical adenopathy.   Skin:     General: Skin is warm and dry.      Findings: No rash.   Neurological:      General: No focal deficit present.      Mental Status: She is alert. Mental status is at baseline.       Labs  Lab Results    Component Value Date    NA 135 (L) 04/10/2021    K 4.2 04/10/2021    CL 100 04/10/2021    BICARB 23 04/10/2021    BUN 13 04/10/2021    CREAT 0.64 04/10/2021    GLU 130 (H) 04/10/2021     9.3 04/10/2021     Lab Results   Component Value Date    WBC 6.5 04/10/2021    RBC 4.69 04/10/2021    HGB 12.7 04/10/2021    HCT 40.1 04/10/2021    MCV 85.5 04/10/2021    MCHC 31.7 (L) 04/10/2021    RDW 15.9 (H) 04/10/2021    PLT 351 04/10/2021    MPV 9.1 (L) 04/10/2021     Lab Results   Component Value Date    AST 24 04/10/2021    ALT 24 04/10/2021    ALK 75 04/10/2021    TP 6.9 04/10/2021    ALB 3.8 04/10/2021    TBILI 0.22 04/10/2021    DBILI <0.2 07/17/2020     Lab Results   Component Value Date    A1C 7.0 (H) 03/20/2021       Imaging/Studies  None available  ASSESSMENT AND PLAN:    Tiffany Chen is a 76 year old female with a history of bronchiectasis, cavitary MAC lung infection, ruptured diverticulitis S/P colostomy in 2009, diabetes, depression, and ICH S/P embolization, who presents to establish care.    Diagnoses and all orders for this visit:    Annual physical exam      -     Routine labs ordered as noted below    Type 2 diabetes mellitus without complication, without long-term current use of insulin (CMS-HCC)  Last Hgb A1c 7.0 in 03/2021.  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 Had 3 coils placed. Aneurysm identified incidentally after a fall. Per pt report, prior neurosurgeon recommended annual imaging for surveillance.  -     MR Angiogram Brain W/WO Contrast; Future    Depression, unspecified depression type  Endorses depressed mood surrounding current health status, no SI. Endorses protective factors and strong family support. PHQ score of  19 today. Will refer to [facility] and psychiatry. Increased Fluoxetine from 30 mg to 40 mg daily.  -     Consult/Referral to [employer].   -     Consult/Referral to Psychiatry  -     fluoxetine (PROZAC) 40 MG capsule; Take 1 capsule (40 mg) by mouth daily.  -     F/u in 1-2 months             Cavitary MAC lung infection  Hx history of recurrent infections and multiple antibiotic courses since 2018. Currently on azithromycin, rifampin, ethambutol, clofazimine, and inhaled Arikayce, and recently s/p right robotic upper lobectomy with middle and lowerlobe wedge resection on 03/20/2021. 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                 -     Pulm f/u as scheduled                 -     GI f/u as scheduled for consideration of intervention for acid reflux    Screening for lipoid disorders  -     Lipid Panel Green Plasma Separator Tube; Future    Encounter for HCV screening test for low risk patient  -     HCV Antibody with Reflex Quant 2 Serum Separator Tubes; Future    Screening for osteoporosis  -     X-RAY Dexa (Bone Density) Skeletal; Future    Encounter for screening for malignant neoplasm of breast, unspecified screening modality  -     Screening Mammogram With Digital Breast Tomosynthesis - Bilateral; Future    Need for vaccination      -     Recommended to obtain tetanus and shingles vaccine at local pharmacy    Patient seen and examined with attending physician, Dr. Mamie Levers.    Leo Grosser, MD  Internal Medicine  Resident Physician    Patient Instructions   Here is what we discussed today:  - Obtain labs while fasting - this can be done at any [place] lab draw site. I will contact you on MyChart about your results.  -  Increase Prozac to 40 mg daily  - Referral provided to psychiatry and behavioral health  - Schedule mammogram  - Schedule DEXA scan  - Schedule MRA brain  - Obtain tetanus booster and shingles vaccine at local pharmacy  - Schedule a  follow-up appointment with me at the front desk in 3 months for [facility] Annual Wellness Visit

## 2021-04-24 ENCOUNTER — Encounter (INDEPENDENT_AMBULATORY_CARE_PROVIDER_SITE_OTHER): Payer: Self-pay

## 2021-04-25 NOTE — Addendum Note (Signed)
Addended by: Ulis Rias on: 04/25/2021 08:51 AM     Modules accepted: Orders, Level of Service

## 2021-05-01 ENCOUNTER — Encounter (INDEPENDENT_AMBULATORY_CARE_PROVIDER_SITE_OTHER): Payer: Self-pay | Admitting: Hospital

## 2021-05-01 ENCOUNTER — Other Ambulatory Visit: Payer: Self-pay

## 2021-05-02 ENCOUNTER — Other Ambulatory Visit: Payer: Self-pay

## 2021-05-07 ENCOUNTER — Other Ambulatory Visit (INDEPENDENT_AMBULATORY_CARE_PROVIDER_SITE_OTHER): Payer: Self-pay | Admitting: Critical Care Medicine

## 2021-05-07 ENCOUNTER — Encounter (INDEPENDENT_AMBULATORY_CARE_PROVIDER_SITE_OTHER): Payer: Self-pay | Admitting: Critical Care Medicine

## 2021-05-07 ENCOUNTER — Other Ambulatory Visit: Payer: Self-pay

## 2021-05-07 NOTE — Telephone Encounter (Signed)
You  Tiffany Chen Just now (11:40 AM)       The prescription system doesn't work for clofazimine since pharmacies don't have it. You just have to let us know hat you need a refill and we request it directly from the manufacturer.     So message received,; I just requested a refill. We should get it in about 10 days.      We will let you know when we receive it so you can come to clinic to pick it up.     Best,   [name], MD    This MyChart message has not been read.     Mansi Salome Arnt  [facility] Mychart (supporting [name], MD) 2 hours ago (9:09 AM)       Have used prescription system to order Clofazimine.  Not sure if this done correctly as issued by [employer].  #[id number] Qty 200

## 2021-05-07 NOTE — Telephone Encounter (Signed)
Tiffany Chen  [facility] Mychart (supporting [name], MD) 2 hours ago (9:19 AM)       I believe this related to Clofazimine medication.  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 Have not developed any others and the existing ones found are fading and healing. Do not hurt or cause problems.  Just wanted to report

## 2021-05-08 ENCOUNTER — Ambulatory Visit
Admission: RE | Admit: 2021-05-08 | Discharge: 2021-05-08 | Disposition: A | Discharge status: Home / Self Care | Payer: Medicare Other | Attending: Allergy | Admitting: Allergy

## 2021-05-08 ENCOUNTER — Encounter (INDEPENDENT_AMBULATORY_CARE_PROVIDER_SITE_OTHER): Payer: Self-pay | Admitting: Internal Medicine

## 2021-05-08 VITALS — BP 132/64 | HR 71 | Temp 97.8°F | Resp 16 | Ht 62.5 in | Wt 135.1 lb

## 2021-05-08 DIAGNOSIS — D809 Immunodeficiency with predominantly antibody defects, unspecified: Secondary | ICD-10-CM | POA: Insufficient documentation

## 2021-05-08 DIAGNOSIS — Z0000 Encounter for general adult medical examination without abnormal findings: Secondary | ICD-10-CM

## 2021-05-08 MED ORDER — IMMUNE GLOBULIN (HUMAN) 20 GM/200ML IJ SOLN
0.5000 g/kg | Freq: Once | INTRAMUSCULAR | Status: AC
Start: 2021-05-08 — End: 2021-05-08
  Administered 2021-05-08: 25 g via INTRAVENOUS
  Filled 2021-05-08: qty 200

## 2021-05-08 MED ORDER — ACETAMINOPHEN 325 MG PO TABS
650.0000 mg | ORAL_TABLET | Freq: Once | ORAL | Status: AC
Start: 2021-05-08 — End: 2021-05-08
  Administered 2021-05-08: 650 mg via ORAL
  Filled 2021-05-08: qty 2

## 2021-05-08 MED ORDER — CETIRIZINE HCL 10 MG OR TABS
10.0000 mg | ORAL_TABLET | Freq: Once | ORAL | Status: AC
Start: 2021-05-08 — End: 2021-05-08
  Administered 2021-05-08: 10 mg via ORAL
  Filled 2021-05-08: qty 1

## 2021-05-08 MED ORDER — DIPHENHYDRAMINE HCL 25 MG OR TABS OR CAPS CUSTOM
25.0000 mg | ORAL_CAPSULE | Freq: Once | ORAL | Status: DC | PRN
Start: 2021-05-08 — End: 2021-05-12

## 2021-05-08 MED ORDER — DEXTROSE 5 % IV SOLN
INTRAVENOUS | Status: DC
Start: 2021-05-08 — End: 2021-05-09

## 2021-05-08 NOTE — Interdisciplinary (Signed)
Non-Chemotherapy Infusion Nursing Note - [place] is a 76 year old female who presents for infusion of IVIG 25 mg    Vitals:    05/08/21 1234 05/08/21 1536   BP: 139/85 132/64   BP Location: Left arm    BP Patient Position: Sitting    Pulse: 85 71   Resp: 16 16   Temp: 97.1 F (36.2 C) 97.8 F (36.6 C)   TempSrc: Temporal Temporal   SpO2: 97%    Weight: 61.3 kg (135 lb 2.3 oz)    Height: 5' 2.5" (1.588 m)      Pain Score: NA (pre med, non-pain or scheduled)  Body surface area is 1.64 meters squared.  Body mass index is 24.32 kg/m.    Pre-treatment nursing assessment:  No problems identified upon assessment. Eating and drinking okay, no nausea, vomiting, diarrhea, fevers, or chills, or shortness of breath.     Medications   dextrose 5% infusion (0 mL IntraVENOUS Completed 05/08/21 1537)   diphenhydrAMINE (BENADRYL) tablet 25 mg (has no administration in time range)   acetaminophen (TYLENOL) tablet 650 mg (650 mg Oral Given 05/08/21 1307)   cetirizine (ZYRTEC) tablet 10 mg (10 mg Oral Given 05/08/21 1307)   immune globulin (human) (IGG) 25 g in 250 mL (GAMUNEX-C) infusion (0 g IntraVENOUS Completed 05/08/21 1537)         [name] tolerated treatment well.    Post blood return: Brisk  Post-Flush: Dextrose and Discontinued IV    Patient Education  Learner: Patient  Barriers to learning: No Barriers  Readiness to learn: Acceptance  Method: Explanation    Treatment Education: Information/teaching given to patient including: signs and symptoms of infection, bleeding, adverse reaction(s), symptom control, and when to notify MD.    [name] Prevention Education: Instructed patient to call for assistance.    Pain Education: Patient instructed to contact nurse if pain should develop or if their current pain therapy becomes ineffective.    Response: Verbalizes understanding    Discharge Plan  Discharge instructions given to patient.  Future appointments given and reviewed with treatment  plan.  Discharge Mode: Ambulatory  Discharge Time: 1540  Accompanied by: Self  Discharged To: Home

## 2021-05-09 ENCOUNTER — Ambulatory Visit (HOSPITAL_BASED_OUTPATIENT_CLINIC_OR_DEPARTMENT_OTHER): Payer: Medicare Other

## 2021-05-09 DIAGNOSIS — K219 Gastro-esophageal reflux disease without esophagitis: Secondary | ICD-10-CM

## 2021-05-09 DIAGNOSIS — Z902 Acquired absence of lung [part of]: Secondary | ICD-10-CM

## 2021-05-09 DIAGNOSIS — A310 Pulmonary mycobacterial infection: Secondary | ICD-10-CM

## 2021-05-09 DIAGNOSIS — J984 Other disorders of lung: Secondary | ICD-10-CM

## 2021-05-10 ENCOUNTER — Encounter (HOSPITAL_BASED_OUTPATIENT_CLINIC_OR_DEPARTMENT_OTHER): Payer: Self-pay

## 2021-05-10 ENCOUNTER — Telehealth (HOSPITAL_BASED_OUTPATIENT_CLINIC_OR_DEPARTMENT_OTHER): Payer: Self-pay

## 2021-05-10 ENCOUNTER — Other Ambulatory Visit: Payer: Self-pay

## 2021-05-10 MED ORDER — CLOFAZIMINE IRB 21-0063 50 MG CAPSULE
0 refills | Status: DC
Start: 2021-05-10 — End: 2022-03-27
  Filled 2021-05-10: qty 200, 100d supply, fill #0

## 2021-05-10 NOTE — Interdisciplinary (Signed)
[facility]/Pulmonary Rehabilitation     05/09/2021     Tiffany Chen presents today for Exercise Therapy and Patient Education      Pulmonary Rehabilitation/OP Respiratory Therapy Services  Individualized Treatment Plan   Time: 1305-1400  Patient Name: Tiffany Chen  Gender: female, 76 years old  Referring Physician: Katheren Shams    Primary Diagnosis:  Diagnoses of Gastroesophageal reflux disease, unspecified whether esophagitis present, Status post lobectomy of lung, MAI (mycobacterium avium-intracellulare) (CMS-HCC), and Cavitary lesion of lung were pertinent to this visit.    Comorbid Conditions/PMD  - Acquired tracheobronchomegaly with bronchiectasis   - MAI ( mycobacterium avium - intracellulare )  - cavitary lesion of lung  - gastroesophageal reflux disease, unspecified whether esophagitis present  - hoarse voice quality  - immunodeficiency with predominantly antibody defects  - s/p robotic right upper lobe lobectomy with en bloc lower and middle lobe wedge resection, middle lobe resection  - type 2 diabetes          Smoking History:  Social History     Tobacco Use   Smoking Status Never   Smokeless Tobacco Never   Vaping Use   Vaping Status Not on file   - second hand exposure from father and first husband      Orthopedic Assessment:  - hernia ( no heavy lifting )  - she experiences stomach pain at times on her right side post lobectomy     Additional Care Concerns:  Allergies:  Allergies   Allergen Reactions   . [employer]  Other     Visual: glasses  Language: English      Surgical History:  Past Surgical History:   Procedure Laterality Date   . BRONCHOSCOPY  12/2020   . COLOSTOMY     . LUNG LOBECTOMY         Pulmonary Function Testing:        Physical Assessment:  Lung sounds: clear      Vitals at rest on room air using forehead probe   05/09/21  0844   BP: 127/71   Pulse: 80   SpO2: 99%       Medications Reviewed:    Current Outpatient Medications:   .   acetaminophen (TYLENOL) 325 MG tablet, Take 3 tablets (975 mg) by mouth every 8 hours for 5 days, then as needed, Disp: 270 tablet, Rfl: 0  .  Amikacin Sulfate Liposome (ARIKAYCE) 590 MG/8.4ML SUSP, 590 mg by Nebulization route daily., Disp: 252 mL, Rfl: 11  .  atorvastatin (LIPITOR) 10 MG tablet, Take 1 tablet (10 mg) by mouth daily., Disp: , Rfl:   .  azithromycin (ZITHROMAX) 250 MG tablet, Take 1 tablet (250 mg) by mouth daily., Disp: 30 tablet, Rfl: 11  .  CLOFAZIMINE IRB [id number] 50 MG CAPSULE, Take 2 capsules (14m) by mouth once daily with food as directed., Disp: 200 capsule, Rfl: 0  .  ethambutol (MYAMBUTOL) 400 MG tablet, Take 2 tablets (800 mg) by mouth daily., Disp: 60 tablet, Rfl: 11  .  ferrous sulfate 325 (65 Fe) MG tablet, Take 1 tablet (325 mg) by mouth 3 times daily., Disp: , Rfl:   .  fluoxetine (PROZAC) 40 MG capsule, Take 1 capsule (40 mg) by mouth daily., Disp: 90 capsule, Rfl: 3  .  metFORMIN (GLUCOPHAGE) 500 mg tablet, Take 1 tablet (500 mg) by mouth 2 times daily (with meals)., Disp: , Rfl:   .  omeprazole (PRILOSEC) 20 MG capsule, TAKE 1  CAPSULE BY MOUTH IN  THE MORNING BEFORE  BREAKFAST, Disp: 90 capsule, Rfl: 3  .  oxyCODONE (ROXICODONE) 5 MG immediate release tablet, Take 1-2 tablets by mouth every 4-6 hours as needed for severe pain, Disp: 40 tablet, Rfl: 0  .  Pediatric Multivitamins-Fl (MULTIVITAMINS/FL PO), , Disp: , Rfl:   .  ramipril (ALTACE) 2.5 MG capsule, Take 1 capsule (2.5 mg) by mouth daily., Disp: , Rfl:   .  rifampin (RIFADIN) 300 MG capsule, Take 2 capsules (600 mg) by mouth daily., Disp: 60 capsule, Rfl: 11  .  senna (SENOKOT) 8.6 MG tablet, Take 2 tablets (17.2 mg) by mouth daily as needed for Constipation. Take while taking narcotic medication, Disp: 20 tablet, Rfl: 0  .  sodium chloride 7 % NEBU, 4 mL by Nebulization route every 12 hours., Disp: 240 mL, Rfl: 11  .  VENTOLIN HFA 108 (90 Base) MCG/ACT inhaler, USE 2 INHALATIONS BY MOUTH  EVERY 6 HOURS AS NEEDED FOR  WHEEZING, Disp: 72 g, Rfl: 3  No current facility-administered medications for this visit.    Facility-Administered Medications Ordered in Other Visits:   .  diphenhydrAMINE (BENADRYL) tablet 25 mg, 25 mg, Oral, Once PRN, Courtney Heys, MD    Initial Assessment  Fall Risk:  No    Dyspnea Assessment:  Dyspnea Assessment  Dyspnea Self Report: Dyspnea w/light ADL's (i.e. Dressing);Dyspnea w/heavy ADL's (i.e. yard work);Dyspnea w/moderate ADL's (i.e. bathing)  mMRC Score : 1  SOBQ Score : 51  CAT Score : 21  Dyspnea Management Goals: Observable and reportable use of pursed lip breathing with exercise/ADL performance  Dyspnea Plan/Intervention Education: Train breath control: pursed lip, diaphragm;Coach breath control during exercise therapy;Train pacing with exertion;Train positions for relief of dyspnea    Cough Assessment:  Cough/Bronchial Hygiene Assessment  Couch/Bronchial Hygiene Assessment: Other: (dry cough)    Oxygen Assessment:  Oxygen Assessment  Oxygenation: No evidence of hypoxemia    Exercise Assessment:  Exercise Assessment  Patient Reported Current Exercise Level : Sedentary  Exercise Goals/Plan/Intervention: Aerobic Exercise 30-60 mins at 3-7 times per [facility] Exercise Program to include all exercise components unless contraindicated.;Strength Training 2-3 times per week;Stretching with sessions  Train Patient : Benefits of exercise;Monitor dyspnea using symptom scale;Exercise safety;Home exercise guidelines    Exercise Prescription:  Exercise Prescription  Train Patient : Benefits of exercise;Monitor dyspnea using symptom scale;Exercise safety;Home exercise guidelines  Modes/Types: Treadmill;Recumbent Elliptical;Strength Training;Lower Body Endurance;Upper Body Endurance;Strength Training and development officer SciFit Combo;Cycle Ergometry Upper Body;arm-R-size  Description: upper and lower body endurance and strength training  Duration in  minutes : 60  Reps: 14 (start with 8 reps with an ending goal of 14 reps)  Sets: 2 (start with 1 set with an ending goal of 2 sets)  Free Weights (lbs):: 4 (start with 2 lbs with an ending goal of 4 lbs)  Frequency of times per week : 2  O2 Sat Level: Monitor & Maintain O2 Sat at 90% or >    Nutrition Assessment:  Nutrition Assessment  Weight: 58.1 kg (128 lb)  Height: _0  (157.5 cm)  BMI kg/m2: 23.41 kg/m2  Patient has Diabetes : Yes (type 2, on medication.)  Recent Weight Fluctuation: Decreased  Decrease: 35 lbs  Time period: 3 years  Appetite: Poor (patient states she doesn't really have an appetite due to her MAC, but she states she still eats. Doctor is aware.)  Difficulty with food preporation?: No  Nutrition Plan/Intervention Education: Coaching and support  with weight and nutrition goals as needed    Psychosocial Assessment:  Psychosocial Assessment  Emotional Health Self-Report: Depression;Anxiety  Stress Level (Self Report): Low  Social Support (self-report): Adequate  Psychosocial Plan/Intervention Education: Instruct breath control and relaxation  Coping Skills and Strategies: Counseling  Psychosocial Assessment Comments : Patient stated that she has been through a lot including her husband passing away due to [facility]. She's been experiencing more depression and anxiety since after her RUL lobectomy. She scored a 20 on her PHQ9 questionnaire, but no thoughts of self harm or to others. She currently is on 4 mg of Prozac and has an appointment with mental health in July. Contacted her PCP, patient is aware. Informed her about Dr. Lenord Fellers and patient stated she will let me know and will get back to me on that.  PHQ9 Patient Initial Summary Score : 20    Core Component Assessment:  Core Component Sleep Quality Assessment  Sleep Quality: Good  OSA (obstructive sleep apnea) : No  Core Component Inhaled Medication Use   Inhaled Medications: Observed/Describes incorrect use;Verbalizes/Demonstrates proper  technique  Inhaled Medication Goal: Correct technique/time  Inhaled Medication Use Plan/Intervention Education: Instruct correct timing/technique for all inhaled Rx  Core Component Respiratory Infection Exacerbation Risk   Respiratory Infection Exacerbation Risk: Knowledge deficit: Respiratory Infection Prevention/Management  Respiratory Infection Exacerbation Risk Goal: Can articulate use of action plan;Describes signs/symptoms, methods to identify & prevent exacerbations.  Respiratory Infection Exacerbation Risk Plan/Intervention Education: Comptroller;Action plan use;Evaluate sputum;When to call MD;Gets vaccines;s/sx to report: purulent sputum, > dyspnea, > fatigue      6 Minute Walk   Purpose:    Responses  Observation  Pursed Lips Breathing : No  Accessory Muscle Use : No  Shoulders Elevated : No  Balance: Fair  Gait: Steady  Assistive Devices: No    Summary  Summary  Total Distance (m): 438  MPH: 2.72  SPO2: 99 (used forehead probe)  Heart Rate : 94  Patient Rated Breathlessness: 1  Patient Rated Muscle Fatigue: 0  Summary Comments : sit to stand 5x : 12.37 seconds         Lyrique Hakim is a 76 year old female with Gastroesophageal reflux disease, unspecified whether esophagitis present, Status post lobectomy of lung, MAI (mycobacterium avium-intracellulare) (CMS-HCC), and Cavitary lesion of lung , referred to [facility] by [name] of [facility]. Mrs. Wagoner was first diagnosed with MAC in 2018, she had symptoms of extreme stomach pain which then led her to get a CT scan.   On March 20, 2021, she had a RUL lobectomy and resection on her middle and lower lobe. 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 She isn't sure what her walk tolerance is, she stated she use to walk a lot prior to having her lobectomy. She has no smoking history but was exposed to second hand smoke from her mother and her first husband. Her motivating goal is to  improve her overall strength and endurance.     Discussed recommendation of enrollment, with patient, to the pulmonary rehabilitation program with sessions twice a week for 16-18 sessions.   Patient will begin at 2/2 lbs for upper and lower strength training for 1 set of 8 reps and increase each session as tolerated with modification for orthopedic concerns.   Endurance training will start on the treadmill for 10 minutes and increase to 30 minutes as tolerated starting at 1.8  mph.  Education/emphasis will be on:  . Providing education for patient's chronic lung condition.  . Endurance and strength training for 60 minutes as tolerated each session.  . Develop a safe home exercise regimen consisting of endurance and strength training.  . Training on energy conservation, purse lip breathing, diaphragmatic breathing, pacing, and the importance of posture.  . Education on symptom monitoring and early reporting to MD of any changes.  . Education on respiratory medications and use of.   . Stress management and relaxation techniques.   . Maintain saturations greater than/equal to 90%.      Cassandra Pugeda-Lascano, RRT

## 2021-05-10 NOTE — Telephone Encounter (Signed)
MACRN: faxed refill requests to pharmacy for Clofazimine.  Message to pt if can pick up at [facility]

## 2021-05-11 ENCOUNTER — Inpatient Hospital Stay (INDEPENDENT_AMBULATORY_CARE_PROVIDER_SITE_OTHER)
Admit: 2021-05-11 | Discharge: 2021-05-11 | Disposition: A | Discharge status: Home Health | Payer: Medicare Other | Attending: Internal Medicine | Admitting: Internal Medicine

## 2021-05-11 ENCOUNTER — Encounter (INDEPENDENT_AMBULATORY_CARE_PROVIDER_SITE_OTHER): Payer: Self-pay | Admitting: Hospital

## 2021-05-11 DIAGNOSIS — Z1231 Encounter for screening mammogram for malignant neoplasm of breast: Secondary | ICD-10-CM

## 2021-05-11 DIAGNOSIS — Z1239 Encounter for other screening for malignant neoplasm of breast: Secondary | ICD-10-CM

## 2021-05-15 ENCOUNTER — Other Ambulatory Visit: Payer: Self-pay

## 2021-05-16 ENCOUNTER — Other Ambulatory Visit: Payer: Medicare Other | Attending: Critical Care Medicine

## 2021-05-16 ENCOUNTER — Other Ambulatory Visit: Payer: Self-pay

## 2021-05-16 DIAGNOSIS — A310 Pulmonary mycobacterial infection: Secondary | ICD-10-CM | POA: Insufficient documentation

## 2021-05-16 DIAGNOSIS — Z8679 Personal history of other diseases of the circulatory system: Secondary | ICD-10-CM | POA: Insufficient documentation

## 2021-05-16 DIAGNOSIS — E119 Type 2 diabetes mellitus without complications: Secondary | ICD-10-CM | POA: Insufficient documentation

## 2021-05-16 DIAGNOSIS — Z13820 Encounter for screening for osteoporosis: Secondary | ICD-10-CM | POA: Insufficient documentation

## 2021-05-16 DIAGNOSIS — Z1159 Encounter for screening for other viral diseases: Secondary | ICD-10-CM | POA: Insufficient documentation

## 2021-05-16 DIAGNOSIS — Z13220 Encounter for screening for lipoid disorders: Secondary | ICD-10-CM | POA: Insufficient documentation

## 2021-05-16 LAB — COMPREHENSIVE METABOLIC PANEL, BLOOD
ALT (SGPT): 27 U/L (ref 0–33)
AST (SGOT): 33 U/L — ABNORMAL HIGH (ref 0–32)
Albumin: 4 g/dL (ref 3.5–5.2)
Alkaline Phos: 57 U/L (ref 40–130)
Anion Gap: 10 mmol/L (ref 7–15)
BUN: 17 mg/dL (ref 8–23)
Bicarbonate: 25 mmol/L (ref 22–29)
Bilirubin, Tot: 0.51 mg/dL (ref <1.2)
Calcium: 9.7 mg/dL (ref 8.5–10.6)
Chloride: 104 mmol/L (ref 98–107)
Creatinine: 0.68 mg/dL (ref 0.51–0.95)
Glucose: 128 mg/dL — ABNORMAL HIGH (ref 70–99)
Potassium: 4.4 mmol/L (ref 3.5–5.1)
Sodium: 139 mmol/L (ref 136–145)
Total Protein: 7.2 g/dL (ref 6.0–8.0)
eGFR Based on CKD-EPI 2021 Equation: >60 mL/min/{1.73_m2}

## 2021-05-16 LAB — CBC WITH DIFF, BLOOD
ANC-Automated: 2.8 10*3/uL (ref 1.6–7.0)
Abs Basophils: 0 10*3/uL (ref <0.2)
Abs Eosinophils: 0.3 10*3/uL (ref 0.0–0.5)
Abs Lymphs: 1 10*3/uL (ref 0.8–3.1)
Abs Monos: 0.6 10*3/uL (ref 0.2–0.8)
Basophils: 1 %
Eosinophils: 5 %
Hct: 39.4 % (ref 34.0–45.0)
Hgb: 12.9 gm/dL (ref 11.2–15.7)
Lymphocytes: 22 %
MCH: 28.3 pg (ref 26.0–32.0)
MCHC: 32.7 g/dL (ref 32.0–36.0)
MCV: 86.4 um3 (ref 79.0–95.0)
MPV: 10.6 fL (ref 9.4–12.4)
Monocytes: 12 %
Plt Count: 257 10*3/uL (ref 140–370)
RBC: 4.56 10*6/uL (ref 3.90–5.20)
RDW: 14.8 % — ABNORMAL HIGH (ref 12.0–14.0)
Segs: 60 %
WBC: 4.7 10*3/uL (ref 4.0–10.0)

## 2021-05-16 LAB — LIPID(CHOL FRACT) PANEL, BLOOD
Cholesterol: 163 mg/dL (ref <200)
HDL-Cholesterol: 53 mg/dL
LDL-Chol (Calc): 81 mg/dL (ref <160)
Non-HDL Cholesterol: 110 mg/dL
Triglycerides: 145 mg/dL (ref 10–170)

## 2021-05-16 LAB — HCV ANTIBODY WITH REFLEX QUANT: Hepatitis C Ab: NONREACTIVE

## 2021-05-17 ENCOUNTER — Other Ambulatory Visit: Payer: Self-pay

## 2021-05-18 ENCOUNTER — Inpatient Hospital Stay (INDEPENDENT_AMBULATORY_CARE_PROVIDER_SITE_OTHER)
Admit: 2021-05-18 | Discharge: 2021-05-18 | Disposition: A | Discharge status: Home Health | Payer: Medicare Other | Attending: Internal Medicine | Admitting: Internal Medicine

## 2021-05-18 DIAGNOSIS — Z13820 Encounter for screening for osteoporosis: Secondary | ICD-10-CM

## 2021-05-18 DIAGNOSIS — Z780 Asymptomatic menopausal state: Secondary | ICD-10-CM

## 2021-05-18 DIAGNOSIS — M8580 Other specified disorders of bone density and structure, unspecified site: Secondary | ICD-10-CM

## 2021-05-21 ENCOUNTER — Other Ambulatory Visit: Payer: Self-pay

## 2021-05-22 ENCOUNTER — Ambulatory Visit (HOSPITAL_BASED_OUTPATIENT_CLINIC_OR_DEPARTMENT_OTHER): Payer: Medicare Other

## 2021-05-22 VITALS — BP 147/76 | HR 70

## 2021-05-22 DIAGNOSIS — A310 Pulmonary mycobacterial infection: Secondary | ICD-10-CM

## 2021-05-22 NOTE — Interdisciplinary (Signed)
[facility]/Pulmonary Rehabilitation     05/22/2021     Tiffany Chen presents today for Patient Education and Exercise Therapy  Patient is a 76 year old female diagnosed with MAC.    Orientation          Time: [phone]  Patient was provided with written materials. Patient was introduced to goals of treatment:  Improve knowledge and understanding of disease and techniques to manage symptoms, improve ADL and exercise tolerance and coping skills. Refer to Plan of Care. Outcome measures were distributed and collected.        Outcome Assessment Scores:  SOBQ =  Score: 43   PHQ9 = PHQ9 Patient Summary Score (calculated): 10   CAT = CAT Total Score : 9   MMRC = 1       Patient Complaints/Concerns: Patient reported hernia with no complaint of pain.    Supervised Exercise Intervention     Exercise Session   Vitals    05/22/21  1550 05/22/21  1608   BP: 147/76 147/76   Pulse: 68 70   SpO2: 98% 97%        Aerobic/Endurance Training   TREADMILL: Yes  MPH: 2.3  Duration in Minutes : 15  % Grade: 4 (1-4.5)  Heart Rate : 105  Breathlessness: 3  Muscle Fatigue  : 2  Pain scale : 0  SpO2: 94  Measurement Type : Finger  O2 Prescribed: No - Room Air    Strength Training   Strength Therapy : Yes - Upper Body;Yes - Lower Body  UB Reps: 8  UB Sets: 1  UB Free Weight #'s : 2  LB Reps: 8  LB Sets: 1  LB Free Weight #'s : 2        Patient was oriented to exercise equipment and instructed in safety.  Summary:  Tiffany Chen's initial therapy session consisted of:  [name] Second 6 MWT and outcome measures.   . Providing written materials and introduced to goals of treatment.  Patient expressed that she would like to master hills and grades.  . Education on disease management.  . Education on breathing techniques, proper posture, and pacing.  . Techniques on self care management, coping skills, and ways to improve ADL.  [name] Discussed lung medication and plans for home exercises.  . Initiated supervised endurance and  strength training.  . Patient conducted upper/lower strength training exercise with 2 lbs. performing 1 set of 8 reps.  . Patient conducted lower body endurance training using Treadmill for 15 minutes, reporting PB 3 and PF 2.   . Oxygen was continuously assessed throughout exercise session to maintain saturations >90%.    Plan:  Continue to educate patient on disease management.  Initiate home exercise regimen consisting of strength and endurance training.  Introduce upper body endurance Arm-R-Size/Scifit exercise for 5 minutes.  Increase upper/lower body strength training using 3 lbs. for 1 set of 10 reps.   Increase lower body endurance with Treadmill for 20-25 minutes at 2.0-2.3/2-4% grade to reporting PB 3-5 and PF 3-5.  Progress with all modes of exercise symptom limited.  Reinforce posture, pacing, and breathing techniques.  Review home medications for technique and adherence.   Educate on nutrition and healthy BMI goals.   Maintain saturations greater than/equal to 90%.    6 Minute Walk   Purpose:  Purpose  Purpose for walk: Pre-Rehab    Resting Stats:0     Responses  Observation  Pursed Lips Breathing :  No  Accessory Muscle Use : No  Shoulders Elevated : No  Balance: Adequate  Gait: Even  Assistive Devices: No    Summary  Total Distance (m): 456  MPH: 2.8  SPO2: 95  Heart Rate : 93  Patient Rated Breathlessness: 2  Patient Rated Muscle Fatigue: 3    Maryann Conners RRT

## 2021-05-24 ENCOUNTER — Ambulatory Visit (HOSPITAL_BASED_OUTPATIENT_CLINIC_OR_DEPARTMENT_OTHER): Payer: Medicare Other

## 2021-05-24 ENCOUNTER — Other Ambulatory Visit: Payer: Self-pay

## 2021-05-24 VITALS — BP 125/55 | HR 82

## 2021-05-24 DIAGNOSIS — A310 Pulmonary mycobacterial infection: Secondary | ICD-10-CM

## 2021-05-24 NOTE — Interdisciplinary (Signed)
[place] [facility]/Pulmonary Rehabilitation     05/24/2021     Tiffany Chen presents today for Patient Education and Exercise Therapy    [name] is a 76 year old female with a diagnosis of mycobacterium avium-intracellulare, referred to [facility] by Dr Debara Pickett with complaints of dyspnea on exertion.    Individual 1:1 Session Time: [id number]        S: No changes to pulmonary symptoms or medications.    O: Vitals are within patient's normal limits.    Education class(es) completed today include:   Lung Medications: Evaluate and trained to use prescribed lung medications for optimal benefit.     Patient Complaints/Concerns: None reported    Supervised Exercise Intervention     Exercise Session Time 40 Minutes  Vital Signs  Blood pressure (BP): (!) 125/55  BP Location: Left arm  Heart Rate: 82  SpO2: 95 %  Measurement Type : Finger  Oxygen: Room Air    Aerobic/Endurance Training   TREADMILL: Yes  MPH: 2.3  Duration in Minutes : 20  % Grade: 4 (4% for 13 minutes then 3%.)  Heart Rate : 104  Breathlessness: 3  Muscle Fatigue  : 3  Pain scale : 0  SpO2: 95  Measurement Type : Finger  O2 Prescribed: No - Room Air    Strength Training   Strength Therapy : Yes - Upper Body;Yes - Lower Body  UB Reps: 12  UB Sets: 1  UB Free Weight #'s : 2  LB Reps: 12  LB Sets: 1  LB Free Weight #'s : 2  Comments: Sit to stand 6 times    Additional Endurance Training  Comments: Sit to stand 6 times      Summary:  Patient's session consisted of education, strength, and endurance training.  Increased lower body endurance with Treadmill at 4% grade and 2.3 mph for 13 minutes then decreased to 3% reporting PB 3 and PF 3 from 15 minutes.  Patient was able to increase upper/lower body strength training with 2 upper & 2  lower lbs. at 1 set(s) of 12 reps from 8 reps.  Saturations remained equal to/greater than 90% on room air.  Breathing techniques, pacing, and posture were practiced during session.    Discussed home exercise regimen.    Plan:  Continue education on disease management.  Increase lower body endurance with Treadmill at 3% grade and 2.3  mph for 25 minutes or to symptoms report PB/PF 3-6.  Perform upper body endurance with Arm-R-Size for 5 minutes or to symptoms report PB/PF 3-6.  Increase upper/lower body strength training with 2 upper & 2 lower lbs. at 1 set(s) of 14 reps.   Reinforce/review home exercise regimen.  Reinforce/review adherence to medications.  Reinforce/review breathing techniques, posture, and pacing.  Make therapeutic changes to patients exercise program as needed.    Barbarann Ehlers, RRT

## 2021-05-27 ENCOUNTER — Ambulatory Visit (HOSPITAL_BASED_OUTPATIENT_CLINIC_OR_DEPARTMENT_OTHER): Admit: 2021-05-27 | Discharge: 2021-05-27 | Disposition: A | Discharge status: Home Health | Payer: Medicare Other

## 2021-05-27 ENCOUNTER — Ambulatory Visit
Admission: RE | Admit: 2021-05-27 | Discharge: 2021-05-27 | Disposition: A | Discharge status: Home / Self Care | Payer: Medicare Other | Attending: Internal Medicine | Admitting: Internal Medicine

## 2021-05-27 ENCOUNTER — Other Ambulatory Visit (HOSPITAL_BASED_OUTPATIENT_CLINIC_OR_DEPARTMENT_OTHER): Payer: Self-pay

## 2021-05-27 DIAGNOSIS — Z0389 Encounter for observation for other suspected diseases and conditions ruled out: Secondary | ICD-10-CM

## 2021-05-27 DIAGNOSIS — Z5309 Procedure and treatment not carried out because of other contraindication: Secondary | ICD-10-CM | POA: Insufficient documentation

## 2021-05-27 DIAGNOSIS — Z09 Encounter for follow-up examination after completed treatment for conditions other than malignant neoplasm: Secondary | ICD-10-CM

## 2021-05-27 DIAGNOSIS — Z8679 Personal history of other diseases of the circulatory system: Secondary | ICD-10-CM

## 2021-05-27 MED ORDER — GADOBUTROL 1 MMOL/ML IV SOLN (WRAPPED RECORD)
5.5000 mL | Freq: Once | INTRAVENOUS | Status: AC
Start: 2021-05-27 — End: 2021-05-27
  Administered 2021-05-27: 5.5 mL via INTRAVENOUS
  Filled 2021-05-27: qty 5.5

## 2021-05-27 MED ORDER — GADOBUTROL 1 MMOL/ML IV SOLN (WRAPPED RECORD)
INTRAVENOUS | Status: AC
Start: 2021-05-27 — End: 2021-05-27
  Filled 2021-05-27: qty 7.5

## 2021-05-29 ENCOUNTER — Ambulatory Visit (HOSPITAL_BASED_OUTPATIENT_CLINIC_OR_DEPARTMENT_OTHER): Payer: Medicare Other

## 2021-05-31 ENCOUNTER — Ambulatory Visit: Payer: Medicare Other | Attending: Critical Care Medicine

## 2021-05-31 VITALS — BP 127/71 | HR 74

## 2021-05-31 DIAGNOSIS — A310 Pulmonary mycobacterial infection: Secondary | ICD-10-CM

## 2021-05-31 DIAGNOSIS — Z902 Acquired absence of lung [part of]: Secondary | ICD-10-CM

## 2021-05-31 NOTE — Interdisciplinary (Signed)
[place] [facility]/Pulmonary Rehabilitation     05/31/2021     Tiffany Chen presents today for Patient Education and Exercise Therapy    Patient Tiffany Lemberger is a 76 year old female with a diagnosis of MAI, referred to [facility] by Dr Debara Pickett with complaints of dyspnea on exertion.    Individual 1:1 Session Time: [id number]       S: No changes to pulmonary symptoms or medications.    O: Vitals are within patient's normal limits.    Education class(es) reviewed today include:   Breathing Retraining: Patient trained in correct posture, pacing and pursed lips breathing to improve breath quality with exertion and when performing activities of daily living.  Home exercise Basics:  Patient was educated on the components of a comprehensive home exercise program (HEP) and given safe guidelines for implementing a HEP.      Patient Complaints/Concerns: None reported    Supervised Exercise Intervention     Exercise Session Time 45 Minutes  Vital Signs  Blood pressure (BP): 127/71  BP Location: Left arm  Heart Rate: 74  SpO2: 95 %  Measurement Type : Finger  Oxygen: Room Air    Aerobic/Endurance Training   TREADMILL: Yes  MPH: 2.3  Duration in Minutes : 25  % Grade: 3  Heart Rate : 101  Breathlessness: 3 (2-3)  Muscle Fatigue  : 3 (2-3)  Pain scale : 0  SpO2: 95  Measurement Type : Finger  O2 Prescribed: No - Room Air    Strength Training   Strength Therapy : Yes - Upper Body;Yes - Lower Body  UB Reps: 14  UB Sets: 1  UB Free Weight #'s : 2  LB Reps: 14  LB Sets: 1  LB Free Weight #'s : 2  Comments: 7 sit to stands    Additional Endurance Training  Comments: 7 sit to stands       Summary:  Patient's session consisted of education, strength, and endurance training.  Increased lower body endurance with Treadmill at 3% grade and 2.3 mph for 25 minutes reporting PB 2-3 and PF 2-3 from 20 minutes.  Performed upper body endurance with Cycle Ergometry Upper body at #2 resistance for 6 minutes  reporting PB 2 and PF 2.  Patient was able to increase upper/lower body strength training with 2 upper & 2  lower lbs. at 1 set(s) of 14 reps from 12 reps.  Saturations remained equal to/greater than 90% on room air.  Breathing techniques, pacing, and posture were practiced during session.   Discussed home exercise regimen.    Plan:  Continue education on disease management.  Increase lower body endurance with Treadmill at 3% grade and 2.3  mph for 30 minutes or to symptoms report PB/PF 3-6.  Increase upper body endurance with Cycle Ergometry Upper body at #2 resistance for 8 minutes or to symptoms report PB/PF 3-6.  Increase upper/lower body strength training with 2 upper & 2 lower lbs. at 2 set(s) of 8 reps.   Reinforce/review home exercise regimen.  Reinforce/review adherence to medications.  Reinforce/review breathing techniques, posture, and pacing.  Make therapeutic changes to patients exercise program as needed.    Barbarann Ehlers, RRT

## 2021-06-04 ENCOUNTER — Other Ambulatory Visit: Payer: Self-pay

## 2021-06-05 ENCOUNTER — Ambulatory Visit
Admission: RE | Admit: 2021-06-05 | Discharge: 2021-06-05 | Disposition: A | Discharge status: Home / Self Care | Payer: Medicare Other | Attending: Allergy | Admitting: Allergy

## 2021-06-05 VITALS — BP 140/71 | HR 72 | Temp 97.3°F | Resp 16 | Ht 62.0 in | Wt 134.9 lb

## 2021-06-05 DIAGNOSIS — D809 Immunodeficiency with predominantly antibody defects, unspecified: Secondary | ICD-10-CM | POA: Insufficient documentation

## 2021-06-05 LAB — IGG, BLOOD: IGG: 1030 mg/dL (ref 700–1600)

## 2021-06-05 MED ORDER — DEXTROSE 5 % IV SOLN
INTRAVENOUS | Status: DC
Start: 2021-06-05 — End: 2021-06-06

## 2021-06-05 MED ORDER — IMMUNE GLOBULIN (HUMAN) 20 GM/200ML IJ SOLN
0.5000 g/kg | Freq: Once | INTRAMUSCULAR | Status: AC
Start: 2021-06-05 — End: 2021-06-05
  Administered 2021-06-05: 25 g via INTRAVENOUS
  Filled 2021-06-05: qty 200

## 2021-06-05 MED ORDER — CETIRIZINE HCL 10 MG OR TABS
10.0000 mg | ORAL_TABLET | Freq: Once | ORAL | Status: AC
Start: 2021-06-05 — End: 2021-06-05
  Administered 2021-06-05: 10 mg via ORAL
  Filled 2021-06-05: qty 1

## 2021-06-05 MED ORDER — DIPHENHYDRAMINE HCL 25 MG OR TABS OR CAPS CUSTOM
25.0000 mg | ORAL_CAPSULE | Freq: Once | ORAL | Status: DC | PRN
Start: 2021-06-05 — End: 2021-06-09

## 2021-06-05 MED ORDER — ACETAMINOPHEN 325 MG PO TABS
650.0000 mg | ORAL_TABLET | Freq: Once | ORAL | Status: AC
Start: 2021-06-05 — End: 2021-06-05
  Administered 2021-06-05: 650 mg via ORAL
  Filled 2021-06-05: qty 2

## 2021-06-05 NOTE — Interdisciplinary (Signed)
Non-Chemotherapy Infusion Nursing Note - Tiffany Chen is a 76 year old female who presents for infusion of IVIG.    Vitals:    06/05/21 1250 06/05/21 1551   BP: 152/77 140/71   BP Location: Left arm Left arm   BP Patient Position: Sitting Sitting   Pulse: 79 72   Resp: 16 16   Temp: 97.1 F (36.2 C) 97.3 F (36.3 C)   TempSrc: Temporal Temporal   SpO2: 97% 95%   Weight: 61.2 kg (134 lb 14.7 oz)    Height: 5\' 2"  (1.575 m)        Pre-treatment nursing assessment:  No problems identified upon assessment.  Patient arrived ambulatory in stable condition.   Denies pain.   Denies nausea or vomiting.   Denies diarrhea or constipation.   Denies fevers or recent illness.     Medications   diphenhydrAMINE (BENADRYL) tablet 25 mg (has no administration in time range)   dextrose 5% infusion (0 mL IntraVENOUS Stopped 06/05/21 1553)   acetaminophen (TYLENOL) tablet 650 mg (650 mg Oral Given 06/05/21 1253)   cetirizine (ZYRTEC) tablet 10 mg (10 mg Oral Given 06/05/21 1253)   immune globulin (human) (IGG) 25 g in 250 mL (GAMUNEX-C) infusion (0 g IntraVENOUS Completed 06/05/21 1550)       Tiffany Chen tolerated treatment well.    Post blood return: Brisk  Post-Flush: NS    Patient Education  Learner: Patient  Barriers to learning: No Barriers  Readiness to learn: Acceptance  Method: Explanation    Treatment Education: Information/teaching given to patient including: signs and symptoms of infection, bleeding, adverse reaction(s), symptom control, and when to notify MD.    [name] Prevention Education: Instructed patient to call for assistance.    Pain Education: Patient instructed to contact nurse if pain should develop or if their current pain therapy becomes ineffective.    Response: Verbalizes understanding    Discharge Plan  Discharge instructions given to patient.  Future appointments given and reviewed with treatment plan.  Discharge Mode: Ambulatory  Discharge Time: [place] by:  Self  Discharged To: Home

## 2021-06-06 ENCOUNTER — Encounter (INDEPENDENT_AMBULATORY_CARE_PROVIDER_SITE_OTHER): Payer: Self-pay | Admitting: Allergy

## 2021-06-07 ENCOUNTER — Ambulatory Visit (HOSPITAL_BASED_OUTPATIENT_CLINIC_OR_DEPARTMENT_OTHER): Payer: Medicare Other

## 2021-06-08 ENCOUNTER — Encounter (HOSPITAL_BASED_OUTPATIENT_CLINIC_OR_DEPARTMENT_OTHER): Payer: Self-pay | Admitting: Hospital

## 2021-06-11 ENCOUNTER — Ambulatory Visit (HOSPITAL_BASED_OUTPATIENT_CLINIC_OR_DEPARTMENT_OTHER): Payer: Medicare Other | Admitting: Audiology

## 2021-06-11 ENCOUNTER — Ambulatory Visit (INDEPENDENT_AMBULATORY_CARE_PROVIDER_SITE_OTHER): Payer: Vision Other Private Insurance | Admitting: Optometrist

## 2021-06-11 ENCOUNTER — Encounter (INDEPENDENT_AMBULATORY_CARE_PROVIDER_SITE_OTHER): Payer: Medicare Other | Admitting: Optometrist

## 2021-06-11 DIAGNOSIS — H524 Presbyopia: Secondary | ICD-10-CM

## 2021-06-11 DIAGNOSIS — H2513 Age-related nuclear cataract, bilateral: Secondary | ICD-10-CM

## 2021-06-11 DIAGNOSIS — H5203 Hypermetropia, bilateral: Secondary | ICD-10-CM

## 2021-06-11 DIAGNOSIS — R7303 Prediabetes: Secondary | ICD-10-CM

## 2021-06-11 DIAGNOSIS — E119 Type 2 diabetes mellitus without complications: Secondary | ICD-10-CM

## 2021-06-11 NOTE — Addendum Note (Signed)
Addendum  created 06/11/21 3149 by Verlena Marlette, Johnnye Lana, MD    Delete clinical note

## 2021-06-11 NOTE — Progress Notes (Unsigned)
Ophthalmology Service  OPTOMETRY PROGRESS NOTE      HPI    Reasons for Visit: REE     LEE: 2333    76 year old Tiffany Chen reported the following symptoms: Pt is a type 2 diabetic. Difficulties with distance and close up. Was told previously at [facility] that may have start of cataracts.    Eye Medications: None      POH: Possibly start of cataracts?     PMHX: Type 2 diabetic.   Med list-- albuterol, rifampin, azithromycin, ethambutol, metformin, fluoxetine, ramipril, omeprazole, atorvastin, citracil, B12, D3, bitoin, multivtiamin,      FOH: All of siblings were glasses    Lab Results       Component                Value               Date                       A1C                      7.0 (H)             03/20/2021              Last edited by de Delrae Sawyers Chiemi on 06/11/2021  1:18 PM.          Assessment/Plan:   1. Hypermetropia of both eyes  2. Presbyopia of both eyes  Hold specrx due to VS cataract.    3. Diabetes mellitus, latent  - Fundus Photos - OU - Both Eyes -Autofluorescence  - OCT, Retina - OU - Both Eyes -    4. Diabetes mellitus type 2 without retinopathy (CMS-HCC)  DWP importance of glucose control to maintain good ocular health. Continue monitoring with DFE annually.    5. Nuclear sclerotic cataract of both eyes  DWP findings - visually significant cataract. Refer to ophthalmology for CE.    Return for NA for cataract eval with ophthalmology.      Hulan Amato. Tiffany Chen, O.D. Lic# [id number]

## 2021-06-12 ENCOUNTER — Ambulatory Visit: Payer: Medicare Other | Attending: General Surgery | Admitting: General Surgery

## 2021-06-12 ENCOUNTER — Encounter (HOSPITAL_BASED_OUTPATIENT_CLINIC_OR_DEPARTMENT_OTHER): Payer: Self-pay | Admitting: General Surgery

## 2021-06-12 VITALS — BP 124/68 | HR 76 | Temp 96.9°F | Resp 16 | Ht 62.0 in | Wt 133.7 lb

## 2021-06-12 DIAGNOSIS — Z933 Colostomy status: Secondary | ICD-10-CM | POA: Insufficient documentation

## 2021-06-12 DIAGNOSIS — K439 Ventral hernia without obstruction or gangrene: Secondary | ICD-10-CM | POA: Insufficient documentation

## 2021-06-12 DIAGNOSIS — K219 Gastro-esophageal reflux disease without esophagitis: Secondary | ICD-10-CM | POA: Insufficient documentation

## 2021-06-12 MED ORDER — LIDOCAINE VISCOUS 2 % MT SOLN
1.0000 mL | OROMUCOSAL | Status: AC | PRN
Start: 2021-06-12 — End: 2022-06-12

## 2021-06-12 MED ORDER — LIDOCAINE HCL 4 % EX SOLN
CUTANEOUS | Status: AC | PRN
Start: 2021-06-12 — End: 2022-06-12

## 2021-06-12 NOTE — Patient Instructions (Signed)
For reflux, obtain these tests:  Manometry (done with GI)  Barium swallow (radiology)    For colostomy and hernia of abdomen:  3. CT abdomen/pelvis  4. Evaluation by colorectal surgery (Dr. Tonita Phoenix)

## 2021-06-12 NOTE — Progress Notes (Signed)
Minimally Invasive Surgery New Consultation  Date: June 12, 2021     Patient Name: Tiffany Chen   Medical Record #: [id number]   DOB: May 05, 1945  Age: 76 year old  Sex: female    Referring MD: Katheren Shams, MD  [address]  [place],  [place] [address]    PCP: Shellia Cleverly    Reason for Visit: No chief complaint on file.      History of Present Illness:     Tiffany Chen is a 76 year old female, Body mass index is 24.45 kg/m., who is here for GERD/paraesophageal hernia.    No symptoms of reflux while on PPI. 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 Also has large ventral hernia and colostomy due to prior diverticulitis perforation (2009). She is interested in eval for ostomy reversal and hernia repair. She is also considering antireflux surgery if there is good chance of curing her MAC.    Has an end colostomy in 2009, [place], [place]    Previous right upper lobectomy with Dr. Berniece Andreas in 03/2021 for [facility]    Past Medical History  Past Medical History:   Diagnosis Date   . Cavitary lesion of lung 07/24/2020   . Depression    . DM (diabetes mellitus) (CMS-HCC)    . 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       Past Surgical History  Past Surgical History:   Procedure Laterality Date   . BRONCHOSCOPY  12/2020   . COLOSTOMY     . LUNG LOBECTOMY         Allergies  Allergies   Allergen Reactions   . [employer]  Other       Medications  Current Outpatient Medications   Medication Sig Dispense Refill   . acetaminophen (TYLENOL) 325 MG tablet Take 3 tablets (975 mg) by mouth every 8 hours for 5 days, then as needed 270 tablet 0   . Amikacin Sulfate Liposome (ARIKAYCE) 590  MG/8.4ML SUSP 590 mg by Nebulization route daily. 252 mL 11   . atorvastatin (LIPITOR) 10 MG tablet Take 1 tablet (10 mg) by mouth daily.     [name] azithromycin (ZITHROMAX) 250 MG tablet Take 1 tablet (250 mg) by mouth daily. 30 tablet 11   . CLOFAZIMINE IRB 21-0063 50 MG CAPSULE Take 2 capsules (124m) by mouth once daily with food as directed. 200 capsule 0   . CLOFAZIMINE IRB 21-0063 50 MG CAPSULE Take 2 capsules (1075m by mouth once daily with food as directed. 200 capsule 0   . ethambutol (MYAMBUTOL) 400 MG tablet Take 2 tablets (800 mg) by mouth daily. 60 tablet 11   . ferrous sulfate 325 (65 Fe) MG tablet Take 1 tablet (325 mg) by mouth 3 times daily.     . fluoxetine (PROZAC) 40 MG capsule Take 1 capsule (40 mg) by mouth daily. 90 capsule 3   . metFORMIN (GLUCOPHAGE) 500 mg tablet Take 1 tablet (500 mg) by mouth 2 times daily (with meals).     . Marland Kitchenmeprazole (PRILOSEC) 20 MG capsule TAKE 1 CAPSULE BY MOUTH IN  THE MORNING BEFORE  BREAKFAST 90 capsule 3   .  oxyCODONE (ROXICODONE) 5 MG immediate release tablet Take 1-2 tablets by mouth every 4-6 hours as needed for severe pain 40 tablet 0   . Pediatric Multivitamins-Fl (MULTIVITAMINS/FL PO)      . ramipril (ALTACE) 2.5 MG capsule Take 1 capsule (2.5 mg) by mouth daily.     . rifampin (RIFADIN) 300 MG capsule Take 2 capsules (600 mg) by mouth daily. 60 capsule 11   . senna (SENOKOT) 8.6 MG tablet Take 2 tablets (17.2 mg) by mouth daily as needed for Constipation. Take while taking narcotic medication 20 tablet 0   . sodium chloride 7 % NEBU 4 mL by Nebulization route every 12 hours. 240 mL 11   . VENTOLIN HFA 108 (90 Base) MCG/ACT inhaler USE 2 INHALATIONS BY MOUTH  EVERY 6 HOURS AS NEEDED FOR WHEEZING 72 g 3     No current facility-administered medications for this visit.       Social History  Social History     Socioeconomic History   . Marital status: Widowed   Tobacco Use   . Smoking status: Never   . Smokeless tobacco: Never   Substance and Sexual Activity   .  Alcohol use: Not Currently   . Drug use: Not Currently   . Sexual activity: Not Currently   Social Activities of Daily Living Present   . Military Service No   . Blood Transfusions No   . Caffeine Concern No   . Occupational Exposure No   . Hobby Hazards Yes     Comment: Gardening, MAC   . Sleep Concern No   . Stress Concern Yes     Comment: Husband death Cov19, 2020, sold home, rehomed pets. [place]   . Weight Concern No   . Special Diet Yes   . Back Care No   . Exercises Regularly Yes   . Bike Helmet Use No   . Seat Belt Use No   . Performs Self-Exams No   Social History Narrative    Lives with brother Tiffany Chen at home. Sister also lives in [place] History  Family History   Problem Relation Name Age of Onset   . Cancer Mother Tiffany Chen ngs and throat   . Heart Disease Mother Tiffany Chen         Congrestive heart failure due to cancer   . Hypertension Mother Tiffany Chen    . Psychiatry Mother Tiffany Chen         Bi polar, manic depressive   . Cancer Father Tiffany Chen         Prostate   . Diabetes Father Tiffany Chen    . Stroke Father Tiffany Chen    . Stroke Maternal Grandmother Tiffany Chen    . Cancer Brother Tiffany Chen         Skin (many locations)   . Diabetes Brother Tiffany Chen        Review of Systems    As described in HPI.    Physical Examination  BP 124/68 (BP Location: Left arm, BP Patient Position: Sitting, BP cuff size: Regular)   Pulse 76   Temp 96.9 F (36.1 C) (Temporal)   Resp 16   Ht _0  (1.575 m)   Wt 60.6 kg (133 lb 11.2 oz)   SpO2 100%   BMI 24.45 kg/m  Body mass index is 24.45 kg/m.  GENERAL: alert, oriented x 3,  no acute distress   HEENT: Normocephalic, atraumatic  CHEST: unlabored.  ABDOMEN: soft, non tender, colostomy in place with large surrounding parastomal hernia  EXTREMITIES: no cyanosis  NEURO: normal speech    Diagnostic Testing/Labs:  Lab Results   Component Value Date    WBC 4.7 05/16/2021    RBC 4.56 05/16/2021    HGB 12.9 05/16/2021    HCT 39.4  05/16/2021    MCV 86.4 05/16/2021    MCHC 32.7 05/16/2021    RDW 14.8 (H) 05/16/2021    PLT 257 05/16/2021    MPV 10.6 05/16/2021    SEG 60 05/16/2021    LYMPHS 22 05/16/2021    MONOS 12 05/16/2021    EOS 5 05/16/2021    BASOS 1 05/16/2021     Lab Results   Component Value Date    BUN 17 05/16/2021    CREAT 0.68 05/16/2021    CL 104 05/16/2021    NA 139 05/16/2021    K 4.4 05/16/2021    Craig 9.7 05/16/2021    TBILI 0.51 05/16/2021    ALB 4.0 05/16/2021    TP 7.2 05/16/2021    AST 33 (H) 05/16/2021    ALK 57 05/16/2021    BICARB 25 05/16/2021    ALT 27 05/16/2021    GLU 128 (H) 05/16/2021     Lab Results   Component Value Date    NA 139 05/16/2021    K 4.4 05/16/2021    CL 104 05/16/2021    BICARB 25 05/16/2021    BUN 17 05/16/2021    CREAT 0.68 05/16/2021    GLU 128 (H) 05/16/2021    [place] 9.7 05/16/2021     Lab Results   Component Value Date    A1C 7.0 (H) 03/20/2021     Lab Results   Component Value Date    CHOL 163 05/16/2021    HDL 53 05/16/2021    LDLCALC 81 05/16/2021    TRIG 145 05/16/2021     No results found for: TSH    Need manometry, barium swallow    Need CT a/p with rectal contrast.    EGD with [facility]: 13.2% acid exposure with demeesters 40-60.    Assessment/Plan:  Elvin Banker is a 76 year old female, Body mass index is 24.45 kg/m., with paraesophageal hernia and GERD with history of MAC in RUL requiring lobectomy.    Possible that MAC could be related to reflux, although definitely no guarantee that anti-reflux operation will "cure" her of MAC. She also has underlying immunosuppression issues. Began the discussion regarding risks/benefits/alternatives to surgery and that Price repair is a significant undertaking with potential serious risks. Complicating matters is her abdominal wall hernia and colostomy status which may limit access to the abdomen for antireflux surgery. She is interested in potential reversal of her colostomy, although she has lived with this for many years. Will refer to Dr.  Astrid Divine for eval if ostomy reversal is feasible and safe, and I could repair her stoma site hernia at the same time. May have to do procedures in staged fashion, if she is a candidate and elects to proceed with them at all. If abdomen is not accessible safely, may need referral to thoracic surgery for consideration of a thorascopic Belsey procedure if aggressively treating with anti-reflux surgery.    Orders placed for barium swallow, manometry.  CT a/p with IV and rectal contrast  Referral to colorectal surgery  RTC once above complete to discuss surgical  options again

## 2021-06-13 ENCOUNTER — Telehealth (HOSPITAL_BASED_OUTPATIENT_CLINIC_OR_DEPARTMENT_OTHER): Payer: Self-pay | Admitting: General Surgery

## 2021-06-13 ENCOUNTER — Telehealth (INDEPENDENT_AMBULATORY_CARE_PROVIDER_SITE_OTHER): Payer: Self-pay | Admitting: Surgery

## 2021-06-13 DIAGNOSIS — Z01812 Encounter for preprocedural laboratory examination: Secondary | ICD-10-CM

## 2021-06-13 NOTE — Telephone Encounter (Signed)
Caller: pt  Relationship to patient: self  Phone # [phone]  Provider: Orlin Hilding  Notes:     Pt is calling in regards to lab orders needed for CT scan (comprehensive metabolic panel) she would be going to [facility] on 06/19/2021. Requesting a call back. Please assist          Caller has been advised of 24-48 hr turnaround time.

## 2021-06-13 NOTE — Progress Notes (Signed)
PULMONARY OUTPATIENT CLINIC FOLLOW-UP NOTE    Chief Complaint   Patient presents with   . Follow Up       History of Present Illness:  Tiffany Chen is a 76 year old female with history of bronchiectasis, cavitary MAC lung infection, ruptured diverticulitis s/p colostomy in 2009, DM, ICH s/p embolization. She is here to follow up on bronchiectasis and MAC lung infection.      Patient's last visit on 04/18/2021 with Dr. Debara Chen to follow up on bronchiectasis and MAC lung infection. I saw Tiffany Chen the last on 02/20/2021 to follow up on bronchiectasis and MAC lung infection. She states doing well from a respiratory stand point since last visit. She denies ED visits or recent hospitalizations.       Tiffany Chen has history of MAC lung infection treated multiple times with azithromycin 530m, Rifampin 6035m, ethambutol 160084mhree times weekly: 2018- 01/19/2018, 05/25/2018- 10/22/2019 and 12/22/2019- 02/16/2020.     As per last note from Dr. ElmDebara Chen In late 2021, CT scan showed enlarging cavity. December 2021 a bronch was performed but no MAC growth. Her doctor restarted azithro/Rifampin/ethmabutol based on imaging.   Feb 2022 she saw Dr. BenIvor Chen stopped the antibiotics based on the lack of microbiologic data. Performed repeat bronch 04/28/2020 which has smear AFB 3+ and culture MAC (drug susc pending).      During her visit from 07/25/2019 with Dr. ElmDebara Chen was strongly recommended to start new round of MAC antibiotic therapy due to the severity of the lung cavitation and severity of her symptoms. The regimen recommended was IV antibiotic (Amikacin) home infusion for 3 months, azithromycin 250 mg daily, ethambutol 800 mg daily and rifampin 600 mg daily.      She started on MAC antibiotic therapy on 08/14/2020 with tentative plan to stop IV amikacin on 11/24/2020 and replaced with arikayce. Her IV was removed on 11/24/2020 and also discussed the possibility of right upper lobe  lobectomy.    11/23/2020 she started on Arikayce nebs every day. She reports mild sore throat with Arykace but was able to tolerate with hot tea.       01/17/2021 - she was added clofazimine to her regimen.  In addition to azithro/rifampin/ethambutol and Arikayce. She also was referred to Dr. OnaBerniece Chen consideration for right upper and middle lobectomy. She was scheduled for RUL lobectomy on 03/20/2021.     03/20/2021 -  She have had Right Robotic upper lobectomy with middle and lower lobe wedge resection.      Her current regimen included: azithromycin, rifampin, ethambutol, clofazimine (which was started 02/21/2021) and inhaled Arikayce.     While on therapy she was advised to have lab work twice a week while on IV antibiotic and then after every month when off from IV antibiotic, continue with Audiogram every 3-4 months, submit sputum samples x 3 every month, EKG every 3-4 months and continue with ishihara discrimination color test.     She has not started pulmonary rehab, but she has been more engaged with physical activity. She has been walking several miles. She reports dyspnea when going on hills.     Tiffany Chen been somehow overwhelm after lobectomy and has forgot to due test required for MAC treatment.     She currently denies any side effects with current MAC antibiotics.      Today, Tiffany Chen feeling well at her baseline. She reports symptom improvement with current MAC antibiotic therapy.  She reports increased cough after her lobectomy surgery, however her cough has been non productive and not been able to provide a sample for culture. She has been taking MAC antibiotics religiously. She has been doing air way clearance once a day with saline solution twice a day.She denies wheezing or dyspnea. She denies hemoptysis, fevers, chills, nausea, vomiting or night sweats.  She is currently capable to walk more several miles without feeling winded. She denies feeling winded when going uphill or  on stairs.  She denies chest congestion, orthopnea, PND lower extremity edema. She reports history of palpitations. She denies allergic rhinitis, sinus problems or other URI. She has history of GERD controlled with diet and PPI. She have had influenza vaccine on fall 2022 and competed COVID vaccines.     Lab work from 05/16/2021 within normal limits( negative for thrombocytopenia, kidney or liver toxicity). EKG from 01/23/2021 with sinus rhythm ( due). She have had audiogram with stable hearing on 11/13/2020 and was recommended to switch to audiogram every 3 month. She is due for new Audiogram on 03/2021    Respiratory culture and AFB from 02/15/2021 and 02/06/2021 positive for MAC.     Review of Systems:   A full  review of systems was performed and the pertinent positives and negatives were mentioned in the HPI, all other review of systems were negative.     Past Medical History:   Diagnosis Date   . Cavitary lesion of lung 07/24/2020   . Depression    . DM (diabetes mellitus) (CMS-HCC)    . 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       Past Surgical History:   Procedure Laterality Date   . BRONCHOSCOPY  12/2020   . COLOSTOMY     . LUNG LOBECTOMY         Family History   Problem Relation Name Age of Onset   . Cancer Mother Tiffany Chen ngs and throat   . Heart Disease Mother Tiffany Chen         Congrestive heart failure due to cancer   . Hypertension Mother Tiffany Chen    . Psychiatry Mother Tiffany Chen         Bi polar, manic depressive   . Cancer Father Tiffany Chen         Prostate   . Diabetes Father Tiffany Chen    . Stroke Father Tiffany Chen    . Stroke Maternal Grandmother Tiffany Chen    . Cancer Brother Tiffany Chen          Skin (many locations)   . Diabetes Brother Tiffany Chen History     Socioeconomic History   . Marital status: Widowed   Tobacco Use   . Smoking status: Never   . Smokeless tobacco: Never   Substance and Sexual Activity   . Alcohol use: Not Currently   . Drug use: Not Currently   . Sexual activity: Not Currently   Other Topics Concern   . Military Service No   . Blood Transfusions No   . Caffeine Concern No   . Occupational Exposure No   .  Hobby Hazards Yes     Comment: Gardening, MAC   . Sleep Concern No   . Stress Concern Yes     Comment: Husband death Cov19, 2020, sold home, rehomed pets. [place]   . Weight Concern No   . Special Diet Yes   . Back Care No   . Exercises Regularly Yes   . Bike Helmet Use No   . Seat Belt Use No   . Performs Self-Exams No   Social History Narrative    Lives with brother Jeneen Rinks at home. Sister also lives in [place]   . Black [employer]  Other       Current Outpatient Medications on File Prior to Visit   Medication Sig Dispense Refill   . acetaminophen (TYLENOL) 325 MG tablet Take 3 tablets (975 mg) by mouth every 8 hours for 5 days, then as needed 270 tablet 0   . Amikacin Sulfate Liposome (ARIKAYCE) 590 MG/8.4ML SUSP 590 mg by Nebulization route daily. 252 mL 11   . atorvastatin (LIPITOR) 10 MG tablet Take 1 tablet (10 mg) by mouth daily.     [name] azithromycin (ZITHROMAX) 250 MG tablet Take 1 tablet (250 mg) by mouth daily. 30 tablet 11   . CLOFAZIMINE IRB 21-0063 50 MG CAPSULE Take 2 capsules (138m) by mouth once daily with food as directed. 200 capsule 0   . [DISCONTINUED] CLOFAZIMINE IRB 21-0063 50 MG CAPSULE Take 2 capsules (1046m by mouth once daily with food as directed. 200 capsule 0   . ethambutol (MYAMBUTOL) 400 MG tablet Take 2 tablets (800 mg) by mouth daily. 60 tablet 11   . ferrous sulfate 325 (65 Fe) MG tablet Take 1 tablet (325 mg) by mouth 3 times daily.     . fluoxetine (PROZAC) 40 MG capsule Take 1 capsule (40 mg)  by mouth daily. 90 capsule 3   . metFORMIN (GLUCOPHAGE) 500 mg tablet Take 1 tablet (500 mg) by mouth 2 times daily (with meals).     . Marland Kitchenmeprazole (PRILOSEC) 20 MG capsule TAKE 1 CAPSULE BY MOUTH IN  THE MORNING BEFORE  BREAKFAST 90 capsule 3   . [DISCONTINUED] oxyCODONE (ROXICODONE) 5 MG immediate release tablet Take 1-2 tablets by mouth every 4-6 hours as needed for severe pain 40 tablet 0   . Pediatric Multivitamins-Fl (MULTIVITAMINS/FL PO)      . ramipril (ALTACE) 2.5 MG capsule Take 1 capsule (2.5 mg) by mouth daily.     . rifampin (RIFADIN) 300 MG capsule Take 2 capsules (600 mg) by mouth daily. 60 capsule 11   . senna (SENOKOT) 8.6 MG tablet Take 2 tablets (17.2 mg) by mouth daily as needed for Constipation. Take while taking narcotic medication 20 tablet 0   . sodium chloride 7 % NEBU 4 mL by Nebulization route every 12 hours. 240 mL 11   . VENTOLIN HFA 108 (90 Base) MCG/ACT inhaler USE 2 INHALATIONS BY MOUTH  EVERY 6 HOURS AS NEEDED FOR WHEEZING 72 g 3     Current Facility-Administered Medications on File Prior to Visit   Medication Dose Route Frequency Provider Last Rate Last Admin   . lidocaine (XYLOCAINE) 2 % viscous solution 1 mL  1 mL Other PRN Broderick, RyEthelle LyonMD       . lidocaine (XYLOCAINE) 4 % topical   Other PRN BrDiona BrownerMD           Physical Examination:  BP  137/65 (BP Location: Left arm, BP Patient Position: Sitting, BP cuff size: Regular)   Pulse 75   Temp 97.3 F (36.3 C) (Temporal)   Resp 18   Ht 5' 2.5" (1.588 m)   Wt 61.4 kg (135 lb 6.4 oz)   SpO2 98%   BMI 24.37 kg/m    Oxygen Therapy  SpO2: 98 %       General: well appearing alert and oriented x 3, in nad.  Oropharynx: without any lesions  Heart:  Regular rate and rhythm, normal S1 S2, no murmurs.  Lungs: clear to auscultation, right upper lobe wheezing, rales, rhonchi, no chest deformities noted. Normal I:E ratio  Extremities:  no cyanosis, clubbing, or edema.  Skin:  No rashes or lesions.  Psych: normal  affect and mood       I have reviewed the following  laboratory data and other diagnostic studies:   CBC:  Lab Results   Component Value Date    WBC 4.7 05/16/2021    HGB 12.9 05/16/2021    HCT 39.4 05/16/2021    PLT 257 05/16/2021     CHEM:  Lab Results   Component Value Date    NA 139 05/16/2021    K 4.4 05/16/2021    CL 104 05/16/2021    BICARB 25 05/16/2021    BUN 17 05/16/2021    CREAT 0.68 05/16/2021    GLU 128 (H) 05/16/2021    [place] 9.7 05/16/2021     COAG:  No results found for: PT, PTT, INR  LFTs:  Lab Results   Component Value Date    AST 33 (H) 05/16/2021    ALT 27 05/16/2021    ALK 57 05/16/2021    TP 7.2 05/16/2021    ALB 4.0 05/16/2021    TBILI 0.51 05/16/2021    DBILI <0.2 07/17/2020          Review of Radiology Studies:    Pulmonary Function Test Review:  04/28/2020 (care everywhere): noraml spito, normal volumes, mildly reduced DLCO    Radiology Review:    Chest x ray (03/20/2021):  FINDINGS:  Lines and Tubes: Large-bore right-sided chest tube seen in place with some subcutaneous emphysema  Mediastinum: The cardiomediastinal silhouette is within normal limits. No lymphadenopathy is appreciated.  Lungs: The lungs are clear. Status post right upper lobe lobectomy with re-expansion of the lung tissue.  Pleura: No pneumothorax or effusion.  Bones and soft tissues: Unchanged    IMPRESSION:  Right-sided large-bore chest tube with no evidence of pneumothorax. Status post right upper lobectomy    Chest x ray (12/25/2020):  FINDINGS:  No pleural effusion or pneumothorax demonstrated.    Right upper lobe cavitary lesions.    Additional small nodularity better seen on the comparison chest CT.    Unremarkable cardiac silhouette.    Slight curvature of the descending thoracic aorta.    No acute osseous abnormality identified.    IMPRESSION:  No postprocedural complication identified.    Ct chest (01/19/2021):  FINDINGS:  Lines and Tubes: None    Thyroid and thoracic inlet: Normal.    Lymph nodes: 11 mm AP  window lymph node measured 7 mm on the prior study. Pre-vascular 7 mm lymph node measured 4 mm on the prior study. Subcarinal lymphadenopathy may be slightly increased.    Cardiovascular: Atherosclerotic disease.    Pericardium: Small amount of pericardial fluid is unchanged    Esophagus: Normal.    Lung: 3.4 cm cavitary mass in the posterior  segment of the right lower lobe measured 3 cm on the prior study. Increasing ground-glass opacity throughout the anterior and apical segments of the right upper lobe. Increasing consolidation at the right apex. Increasing nodularity and bronchial wall thickening in the right upper lobe. Wall thickness of a cavity in the right upper lobe on series 4, image 334 measured 9 mm. This measured 3 mm on the prior study. Increasing ground-glass opacity in the lower lobes. Increasing tree-in-bud nodularity. Previously seen nodularity persists.    Pleura: Normal.    Abdomen: Visualized superior abdomen demonstrates hepatic hypodensity which is unchanged    Bone and soft tissue: No acute abnormality.    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 Additionally, ground-glass opacity and nodularity has increased.    CT chest (11/09/2020):  FINDINGS:  Lines and Tubes: PICC ends in the left brachiocephalic vein    Thyroid and thoracic inlet: Normal.  Lymph nodes: Lymph nodes have increased in size. 8 mm AP window lymph node measured 4 mm on the prior study. 8 mm lymph node adjacent to the superior vena cava measured 6 mm on the prior study. Other lymph nodes are unchanged.  Cardiovascular: Atherosclerotic disease is relatively mild for the patient's age.  Pericardium: Small pericardial effusion  Esophagus: Normal.    Lung: Comparison to the outside study is limited due to poor spatial resolution on that study given 5 mm slice thickness versus 1.610 mm slice  thickness. New thick walled cavitary 2.6 cm right apical nodule on series 2, image 393 adjacent 12 mm thick walled cavitary nodule is new. Worsening tree-in-bud nodularity and bronchiectasis in the right upper lobe. Thick walled cavity in the posterior segment of the right upper lobe measuring 3.1 cm is unchanged in size but demonstrates increased wall thickness. Surrounding tree-in-bud nodularity is increased. Cavitary nodule in the right middle lobe is unchanged. Bronchiectasis may be increased but is again difficult to assess given differences in slice thickness. Worsening tree-in-bud nodularity throughout the right middle and right lower lobes. Worsening nodular consolidation in the medial aspect of the right middle lobe. New areas of nodularity throughout the left lung.  Pleura: Normal.    Abdomen: 1.2 cm hypoattenuating nodule in the liver. This is unchanged    Bone and soft tissue: No acute abnormality.    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    CT chest 04/13/2020:   CONCLUSION:   1. 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   2. Cavitary lesion in the periphery of the right lung has increased in size measuring 2.7 cm, although may have a slightly less thickened and nodular wall.   3. Diffuse bronchiectasis most pronounced in the right upper lobe.     Microbiology  BAL 04/28/2020 (from Dr. Cecile Hearing): AFB smear 3+, culture MAC (drug susceptibly testing sent to [facility]- pending)      6 MIN WALK (01/30/2021):      PFTs ( 02/16/2021):      ASSESSMENT AND PLAN:  Tiffany Chen is a 76 year old female with history of bronchiectasis, cavitary MAC lung infection, ruptured diverticulitis s/p colostomy in 2009, DM, ICH s/p embolization. She is here to follow up on bronchiectasis and MAC lung infection.     #  Bronchiectasis/ Cavitary MAC lung infection:  currently stable with mild symptoms. She was advised to start MAC antibiotic therapy due to the severity of her lesions and symptoms. She started on  IV antibiotic Amikacin home infusion for 3 months (08/14/2020 to 11/24/2020), azithromycin 250 mg daily, ethambutol 800 mg daily and rifampin 600 mg daily.  She was started on inhaled Arikayce on 11/23/2020.  Clofazimine was added on 1/11/20223. She have had right Robotic upper lobectomy with middle and lower lobe wedge resection on 03/20/2021.  She reports improved condition with current MAC antibiotic therapy. She reports increased non productive cough since her lobectomy surgery and no other symptom. She has been adherent to MAC antibiotics and air way clearance. Her current regimen included: azithromycin 250 mg daily, rifampin 600 mg daily, ethambutol 800 mg daily, clofazimine 100 mg daily (which was started 02/21/2021) and daily inhaled Arikayce. PFT from 04/28/2020 ( outside) wnl, mildly reduced DLCO. PFT from 02/16/2021 with ( FVC 2.00- 79%, FEV 1 1.60 - 85%, FEV1/FVC 80, DLCOc 80%).  CT Chest from 04/13/2020 with micronodular and tree in bud opacity consistent with MAC infection. Cavitary lesion to RUL about 2.7 cm. CT chest from 11/09/2020 with worsening of nontuberculous mycobacterial disease infection involving the right lung (new thick walled cavitary 2.6 cm right apical nodule on series 2, image 393 adjacent 12 mm thick walled cavitary nodule is new. Worsening tree-in-bud nodularity and bronchiectasis in the right upper lobe). Ct chest from 01/19/2021 with worsenig cavitary nontuberculous mycobacterial disease has worsened with increasing size of cavitary lesions with associated increasing wall thickness. Sputum cultures from 02/2021 x 2 and 01/2021 positive for mycobacteria growth.. I have discussed air way clearance and MAC antibiotic therapy. I have recommended her to continue with air way clearance once a  day with saline nebulizer solution followed by acapella device . Patient was instructed to use albuterol inhaler 2 puffs before air way clearance and as needed for cough, wheezing or shortness of breath. I have recommended her continue with Arikayce daily, azithromycin 250 mg daily, ethambutol 800 mg daily, clofazimine 100 mg daily and rifampin 600 mg daily. She is overdue for lab work. EKG and Audiogram./ I have reminded her to continue with lab work every month, sputum cultures for AFB and respiratory cultures x 3 per month, EKG and Audiogram every 3-4 months  and to continue with ishihara discrimination color chart. Patient was advised to monitor for respiratory symptoms and call back if any changes. I have placed a new order for CT chest due on June.  ED precautions given.     # Immunizations: up to date.     "I personally spend a total of 60 Minutes in face to face and non face to face activities related to patient's visit today, excluding and separately reportable services/procedures."    Follow up with Dr. Debara Chen in 2 months.     Zella Richer NP-BC  Pulmonary Medicine

## 2021-06-13 NOTE — Telephone Encounter (Signed)
Caller: Patient  Relationship to patient: self  Phone # [phone]  Preferred Method of Communication: call    Notes: Patient has been referred over from Dr. Orlin Hilding to see Dr. Tonita Phoenix for ostomy reversal    Please assist     Caller has been advised of  72 hr turnaround time.

## 2021-06-13 NOTE — Telephone Encounter (Signed)
Spoke to pt and advised that new CMP order was placed and pt may complete prior to CTAP scan. Pt verbalizes understanding.

## 2021-06-14 NOTE — Telephone Encounter (Signed)
Direct referral to Dr. Tonita Phoenix for ostomy reversal, please schedule appt with him.

## 2021-06-15 NOTE — Telephone Encounter (Signed)
Pt has been scheduled with Dr. Tonita Phoenix Tuesday 06/20 at 10am.

## 2021-06-18 ENCOUNTER — Other Ambulatory Visit: Payer: Self-pay

## 2021-06-19 ENCOUNTER — Telehealth (HOSPITAL_BASED_OUTPATIENT_CLINIC_OR_DEPARTMENT_OTHER): Payer: Self-pay | Admitting: Nurse Practitioner

## 2021-06-19 ENCOUNTER — Ambulatory Visit: Payer: Medicare Other | Attending: Nurse Practitioner | Admitting: Nurse Practitioner

## 2021-06-19 ENCOUNTER — Telehealth (HOSPITAL_BASED_OUTPATIENT_CLINIC_OR_DEPARTMENT_OTHER): Payer: Self-pay

## 2021-06-19 ENCOUNTER — Ambulatory Visit (HOSPITAL_BASED_OUTPATIENT_CLINIC_OR_DEPARTMENT_OTHER): Payer: Medicare Other

## 2021-06-19 ENCOUNTER — Encounter (HOSPITAL_BASED_OUTPATIENT_CLINIC_OR_DEPARTMENT_OTHER): Payer: Self-pay

## 2021-06-19 ENCOUNTER — Encounter (HOSPITAL_BASED_OUTPATIENT_CLINIC_OR_DEPARTMENT_OTHER): Payer: Self-pay | Admitting: Nurse Practitioner

## 2021-06-19 ENCOUNTER — Other Ambulatory Visit (HOSPITAL_BASED_OUTPATIENT_CLINIC_OR_DEPARTMENT_OTHER): Payer: Medicare Other

## 2021-06-19 VITALS — BP 137/65 | HR 75 | Temp 97.3°F | Resp 18 | Ht 62.5 in | Wt 135.4 lb

## 2021-06-19 DIAGNOSIS — J479 Bronchiectasis, uncomplicated: Secondary | ICD-10-CM | POA: Insufficient documentation

## 2021-06-19 DIAGNOSIS — Z01812 Encounter for preprocedural laboratory examination: Secondary | ICD-10-CM

## 2021-06-19 DIAGNOSIS — A319 Mycobacterial infection, unspecified: Secondary | ICD-10-CM | POA: Insufficient documentation

## 2021-06-19 LAB — CBC WITH DIFF, BLOOD
ANC-Automated: 4 10*3/uL (ref 1.6–7.0)
Abs Basophils: 0 10*3/uL (ref <0.2)
Abs Eosinophils: 0.2 10*3/uL (ref 0.0–0.5)
Abs Lymphs: 1.2 10*3/uL (ref 0.8–3.1)
Abs Monos: 0.6 10*3/uL (ref 0.2–0.8)
Basophils: 1 %
Eosinophils: 3 %
Hct: 37.3 % (ref 34.0–45.0)
Hgb: 12.2 gm/dL (ref 11.2–15.7)
Lymphocytes: 20 %
MCH: 28.7 pg (ref 26.0–32.0)
MCHC: 32.7 g/dL (ref 32.0–36.0)
MCV: 87.8 um3 (ref 79.0–95.0)
MPV: 10.5 fL (ref 9.4–12.4)
Monocytes: 10 %
Plt Count: 268 10*3/uL (ref 140–370)
RBC: 4.25 10*6/uL (ref 3.90–5.20)
RDW: 15.2 % — ABNORMAL HIGH (ref 12.0–14.0)
Segs: 66 %
WBC: 6 10*3/uL (ref 4.0–10.0)

## 2021-06-19 LAB — COMPREHENSIVE METABOLIC PANEL, BLOOD
ALT (SGPT): 34 U/L — ABNORMAL HIGH (ref 0–33)
AST (SGOT): 30 U/L (ref 0–32)
Albumin: 4 g/dL (ref 3.5–5.2)
Alkaline Phos: 60 U/L (ref 40–130)
Anion Gap: 14 mmol/L (ref 7–15)
BUN: 19 mg/dL (ref 8–23)
Bicarbonate: 23 mmol/L (ref 22–29)
Bilirubin, Tot: 0.37 mg/dL (ref <1.2)
Calcium: 9.7 mg/dL (ref 8.5–10.6)
Chloride: 101 mmol/L (ref 98–107)
Creatinine: 0.76 mg/dL (ref 0.51–0.95)
Glucose: 124 mg/dL — ABNORMAL HIGH (ref 70–99)
Potassium: 4.3 mmol/L (ref 3.5–5.1)
Sodium: 138 mmol/L (ref 136–145)
Total Protein: 7.3 g/dL (ref 6.0–8.0)
eGFR Based on CKD-EPI 2021 Equation: >60 mL/min/{1.73_m2}

## 2021-06-19 NOTE — Telephone Encounter (Signed)
-----   Message from [name], MD sent at 06/19/2021 12:02 PM PDT -----  Yes sputum induction should be safe now    Thanks Sunday Shams  ----- Message -----  From: Geraldine Contras, Sunday Shams, NP  Sent: 06/19/2021  10:27 AM PDT  To: [name], MD    Hr. Dr. Fransico Michael,     I saw Mrs. Sutliff today and she is doing well. She states not been able to provide with sputum samples for AFB since 02/2021. Her cough is dry and not even doing air way clearance twice a day helps to expectorate.     Should I placed orders for sputum by induction considering her lobectomy surgery on 03/20/2021? Or should we wait?    Thank you.

## 2021-06-19 NOTE — Telephone Encounter (Signed)
Hi Candace,     I placed order for AFB/ respiratory culture by induction x 3 this month. I communicated with Dr. Fransico Michael and he said was safe to do induction after 3 months post lobectomy.     Thank you.

## 2021-06-19 NOTE — Patient Instructions (Addendum)
-   Please continue with air way clearance 2 times a day ( albuterol solution followed by saline solution and Aerobika/ acapella device).  - Continue with your inhalers as prescribed.  -Please continue with MAC antibiotic therapy as instructed.   - Please submit sputum samples for AFB and respiratory culture x 3 every 4-6 weeks.  - Continue with lab work (CBC, CMP) every  month (due now ).  - Continue with Audiogram every 3-4 months ( due now)  - EKG every 3-4 months (due now)  - Perform Ishihara discrimination color chart 3 times a week minimum.  - Please have CT chest (due on June/2023)   - Monitor for acute respiratory symptoms and provide with a call back if worsen.   - Follow up with [name] in 2 months

## 2021-06-19 NOTE — Telephone Encounter (Signed)
Attempted to contact pt for her missed appt today. No answer, Left a Vm for pt to contact office.     [employer]

## 2021-06-20 ENCOUNTER — Other Ambulatory Visit: Payer: Self-pay

## 2021-06-20 ENCOUNTER — Telehealth (INDEPENDENT_AMBULATORY_CARE_PROVIDER_SITE_OTHER): Payer: Self-pay

## 2021-06-20 NOTE — Telephone Encounter (Addendum)
Left Voicemail to schedule sooner appt with Dr. Amalia Hailey [facility]  Per Victorino Dike        Noting as Lorain Childes

## 2021-06-20 NOTE — Telephone Encounter (Signed)
Patient states that does not have acces to a printer for the questionnaire and will fill out prior to check in for her motility procedure on 07/03/21.thank you  FYI

## 2021-06-21 ENCOUNTER — Ambulatory Visit (HOSPITAL_BASED_OUTPATIENT_CLINIC_OR_DEPARTMENT_OTHER): Payer: Medicare Other

## 2021-06-21 ENCOUNTER — Ambulatory Visit
Admission: RE | Admit: 2021-06-21 | Discharge: 2021-06-21 | Disposition: A | Discharge status: Home / Self Care | Payer: Medicare Other | Attending: Nurse Practitioner | Admitting: Nurse Practitioner

## 2021-06-21 DIAGNOSIS — J479 Bronchiectasis, uncomplicated: Secondary | ICD-10-CM | POA: Insufficient documentation

## 2021-06-22 ENCOUNTER — Encounter (HOSPITAL_BASED_OUTPATIENT_CLINIC_OR_DEPARTMENT_OTHER): Payer: Self-pay

## 2021-06-22 ENCOUNTER — Other Ambulatory Visit: Payer: Self-pay

## 2021-06-22 ENCOUNTER — Encounter (HOSPITAL_COMMUNITY): Payer: Self-pay | Admitting: Hospital

## 2021-06-22 ENCOUNTER — Telehealth (HOSPITAL_BASED_OUTPATIENT_CLINIC_OR_DEPARTMENT_OTHER): Payer: Self-pay | Admitting: Surgery

## 2021-06-22 ENCOUNTER — Ambulatory Visit
Admission: RE | Admit: 2021-06-22 | Discharge: 2021-06-22 | Disposition: A | Discharge status: Home / Self Care | Payer: Medicare Other | Attending: General Surgery | Admitting: General Surgery

## 2021-06-22 DIAGNOSIS — K439 Ventral hernia without obstruction or gangrene: Secondary | ICD-10-CM | POA: Insufficient documentation

## 2021-06-22 DIAGNOSIS — R918 Other nonspecific abnormal finding of lung field: Secondary | ICD-10-CM

## 2021-06-22 DIAGNOSIS — K435 Parastomal hernia without obstruction or  gangrene: Secondary | ICD-10-CM

## 2021-06-22 DIAGNOSIS — K219 Gastro-esophageal reflux disease without esophagitis: Secondary | ICD-10-CM | POA: Insufficient documentation

## 2021-06-22 DIAGNOSIS — Z933 Colostomy status: Secondary | ICD-10-CM | POA: Insufficient documentation

## 2021-06-22 DIAGNOSIS — J479 Bronchiectasis, uncomplicated: Secondary | ICD-10-CM | POA: Insufficient documentation

## 2021-06-22 DIAGNOSIS — A319 Mycobacterial infection, unspecified: Secondary | ICD-10-CM | POA: Insufficient documentation

## 2021-06-22 MED ORDER — IOHEXOL 240 MG/ML IJ SOLN
50.0000 mL | Freq: Once | INTRAMUSCULAR | Status: AC
Start: 2021-06-22 — End: 2021-06-22
  Administered 2021-06-22: 50 mL via RECTAL
  Filled 2021-06-22: qty 50

## 2021-06-22 MED ORDER — IOHEXOL 350 MG/ML IV SOLN
100.0000 mL | Freq: Once | INTRAVENOUS | Status: AC
Start: 2021-06-22 — End: 2021-06-22
  Administered 2021-06-22: 100 mL via INTRAVENOUS

## 2021-06-23 ENCOUNTER — Ambulatory Visit (HOSPITAL_BASED_OUTPATIENT_CLINIC_OR_DEPARTMENT_OTHER): Admit: 2021-06-23 | Payer: Medicare Other

## 2021-06-26 ENCOUNTER — Ambulatory Visit (HOSPITAL_BASED_OUTPATIENT_CLINIC_OR_DEPARTMENT_OTHER): Payer: Medicare Other

## 2021-06-26 ENCOUNTER — Other Ambulatory Visit (HOSPITAL_BASED_OUTPATIENT_CLINIC_OR_DEPARTMENT_OTHER): Payer: Medicare Other

## 2021-06-26 ENCOUNTER — Ambulatory Visit: Payer: Medicare Other | Attending: Surgery | Admitting: Surgery

## 2021-06-26 ENCOUNTER — Encounter (HOSPITAL_BASED_OUTPATIENT_CLINIC_OR_DEPARTMENT_OTHER): Payer: Self-pay | Admitting: Surgery

## 2021-06-26 VITALS — BP 121/66 | HR 81 | Temp 97.3°F | Resp 16 | Ht 62.5 in | Wt 130.0 lb

## 2021-06-26 DIAGNOSIS — Z933 Colostomy status: Secondary | ICD-10-CM | POA: Insufficient documentation

## 2021-06-26 DIAGNOSIS — E119 Type 2 diabetes mellitus without complications: Secondary | ICD-10-CM

## 2021-06-26 LAB — TYPE & SCREEN
ABO/RH: A NEG
Antibody Screen: NEGATIVE

## 2021-06-26 LAB — GLYCOSYLATED HGB(A1C), BLOOD: Glyco Hgb (A1C): 6.8 % — ABNORMAL HIGH (ref 4.8–5.8)

## 2021-06-26 MED ORDER — METRONIDAZOLE 500 MG OR TABS
ORAL_TABLET | ORAL | 0 refills | Status: AC
Start: 2021-06-26 — End: 2021-07-06

## 2021-06-26 MED ORDER — METRONIDAZOLE 500 MG OR TABS
ORAL_TABLET | ORAL | 0 refills | Status: DC
Start: 2021-06-26 — End: 2021-06-26

## 2021-06-26 MED ORDER — POLYETHYLENE GLYCOL 3350 OR POWD
ORAL | 0 refills | Status: DC
Start: 2021-06-26 — End: 2021-06-26

## 2021-06-26 MED ORDER — POLYETHYLENE GLYCOL 3350 OR POWD
ORAL | 0 refills | Status: DC
Start: 2021-06-26 — End: 2021-10-16

## 2021-06-26 MED ORDER — NEOMYCIN SULFATE 500 MG OR TABS
ORAL_TABLET | ORAL | 0 refills | Status: DC
Start: 2021-06-26 — End: 2022-03-12

## 2021-06-26 MED ORDER — NEOMYCIN SULFATE 500 MG OR TABS
ORAL_TABLET | ORAL | 0 refills | Status: DC
Start: 2021-06-26 — End: 2021-06-26

## 2021-06-26 NOTE — Patient Instructions (Signed)
Discharge Instructions    Date: 06/26/2021    Location:   [facility]- 3rd floor, [address]  [address]  [place], [place] [address]       Service: [facility] Colorectal Surgery Department     Provider:  Dorian Furnace, MD    Plan of Care:   Please call our surgery scheduling office at [phone] to start the process of scheduling your surgery to treat your stoma hernia. Please wait at least 3 business days prior to calling.  Your prescriptions for bowel prep and antibiotics were sent to your local pharmacy.  Bowel prep instructions, antibiotics, CHG body wash, and Ensure drinks from blue folder reviewed with patient at office visit  Your surgeon has determined that you are a good candidate for our Enhanced Recovery after Surgery (ERAS) Program. This program uses the best practices in surgical care.  It will help you recover and get home as quickly and as safely as possible after your surgery.  Please read this handout carefully to get the most out of the ERAS program  Please provide [place] with your recent colonoscopy report and pathology or call Clydie Braun, RN with name of facility to request records  Please see your pulmonologist for clearance  Please complete blood work today    Colorectal Surgery Appointment and Procedure Scheduling: Please call the Colorectal Surgery Scheduling department at the number listed below to schedule your appointment or surgical procedure. If needed, our authorization department will contact your insurance company to request authorization for your procedure(s). This may take 1-5 business days    Phone [phone]    Fax: [phone]    Questions/Concerns: For questions or concerns about today's appointment, you may contact Registered Nurse Case Manager Piedad Climes @ [phone]     Evenings/Weekends: If you have questions or concerns after hours, please call the hospital operator at [phone] and ask for the general surgery resident  on call. He or she will be in communication with your doctor.     Emergencies: If you are experiencing a medical emergency, call 911 or go to the nearest emergency department and ask them to contact your provider at [phone].    We welcome feedback about your care at [facility]. Thank you for sharing your experience! You may submit your comments and feedback by phone or email.    E-mail: [email or web address]   Phone: [phone]     Follow [place] on Twitter [email or web address] 

## 2021-06-26 NOTE — Progress Notes (Signed)
History and Physical     Referring MD:  Shellia Cleverly, MD  [place],  [place] [address]    History of Present Illness:     Tiffany Chen is a 76 year old female who is here for evaluation.  Patient with history of open Hartman procedure for perforated diverticulitis in 2009.  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  Colostomy functioning well and appliance seating well without leakage.  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 Berniece Andreas in March 2023.  Patient recovered well.  Currently being evaluated by Dr. Hillery Hunter for paraesophageal hernia as possible cause of chronic reflux with micro aspirations    I have independently reviewed and interpreted the following imaging findings and agree with radiologist's interpretation except as stated below and discussed them with the patient.    CT Ch/A/P 06/22/21:  No leak at the [facility] pouch    Large peristomal hernia with a wide orifice    1. Post interval right upper lobectomy with a small residual right hydropneumothorax.      2. Scattered tree-in-bud airspace opacities throughout the lungs, similar to slightly improved.  Previously seen patchy groundglass opacities within the lungs appear improved.    3. Mediastinal lymphadenopathy.  This appears slightly increased in comparison to the prior study    Chief Complaint   Patient presents with   . New Patient        Past Medical History:   Diagnosis Date   . Acquired tracheobronchomegaly with bronchiectasis (CMS-HCC) 05/22/2020   . Cavitary lesion of lung 07/24/2020   . Depression    . DM (diabetes mellitus) (CMS-HCC)    . Gastroesophageal reflux disease, unspecified whether esophagitis present 10/12/2020    Added automatically from request for surgery [id number]   . 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 MAI  (mycobacterium avium-intracellulare) (CMS-HCC) 05/22/2020   . 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     Patient denies history of: colorectal cancer, Crohn's disease and ulcerative colitis    Past Surgical History:   Procedure Laterality Date   . BRONCHOSCOPY  12/2020   . COLOSTOMY     . LUNG LOBECTOMY         Allergies   Allergen Reactions   . Black [employer]  Other     Current Outpatient Medications   Medication Sig   . Amikacin Sulfate Liposome (ARIKAYCE) 590 MG/8.4ML SUSP 590 mg by Nebulization route daily.   [name] atorvastatin (LIPITOR) 10 MG tablet Take 1 tablet (10 mg) by mouth daily.   [name] azithromycin (ZITHROMAX) 250 MG tablet Take 1 tablet (250 mg) by mouth daily.   . CLOFAZIMINE IRB 21-0063 50 MG CAPSULE Take 2 capsules (124m) by mouth once daily with food as directed.   . ethambutol (MYAMBUTOL) 400 MG tablet Take 2 tablets (800 mg) by mouth daily.   . ferrous sulfate 325 (65 Fe) MG tablet Take 1 tablet (325 mg) by mouth 3 times daily.   . fluoxetine (PROZAC) 40 MG capsule Take  1 capsule (40 mg) by mouth daily.   . metFORMIN (GLUCOPHAGE) 500 mg tablet Take 1 tablet (500 mg) by mouth 2 times daily (with meals).   . metroNIDAZOLE (FLAGYL) 500 MG tablet Take one tablet (500 mg) by mouth at noon and one tablet (500 mg) at 8 PM on the day prior to surgery.   [name] neomycin 500 MG tablet Take 2 tablets (1000 mg) by mouth at noon and 2 tablets (1000 mg) at 8 PM on the day prior to surgery.   [name] omeprazole (PRILOSEC) 20 MG capsule TAKE 1 CAPSULE BY MOUTH IN  THE MORNING BEFORE  BREAKFAST   . Pediatric Multivitamins-Fl (MULTIVITAMINS/FL PO)    . polyethylene glycol (GLYCOLAX) 17 GM/SCOOP  powder Mix entire bottle in 2 liters of Gatorade and begin drinking at noon on the day prior to surgery.   . ramipril (ALTACE) 2.5 MG capsule Take 1 capsule (2.5 mg) by mouth daily.   . rifampin (RIFADIN) 300 MG capsule Take 2 capsules (600 mg) by mouth daily.   . sodium chloride 7 % NEBU 4 mL by Nebulization route every 12 hours.   . VENTOLIN HFA 108 (90 Base) MCG/ACT inhaler USE 2 INHALATIONS BY MOUTH  EVERY 6 HOURS AS NEEDED FOR WHEEZING     Current Facility-Administered Medications   Medication   . lidocaine (XYLOCAINE) 2 % viscous solution 1 mL   . lidocaine (XYLOCAINE) 4 % topical       Social History     Socioeconomic History   . Marital status: Widowed   Tobacco Use   . Smoking status: Never   . Smokeless tobacco: Never   Substance and Sexual Activity   . Alcohol use: Not Currently   . Drug use: Not Currently   . Sexual activity: Not Currently   Social Activities of Daily Living Present   . Military Service No   . Blood Transfusions No   . Caffeine Concern No   . Occupational Exposure No   . Hobby Hazards Yes     Comment: Gardening, MAC   . Sleep Concern No   . Stress Concern Yes     Comment: Husband death Cov19, 2020, sold home, rehomed pets. [place]   . Weight Concern No   . Special Diet Yes   . Back Care No   . Exercises Regularly Yes   . Bike Helmet Use No   . Seat Belt Use No   . Performs Self-Exams No   Social History Narrative    Lives with brother Jeneen Rinks at home. Sister also lives in [place]     Family History   Problem Relation Name Age of Onset   . Cancer Mother Prescilla Sours ngs and throat   . Heart Disease Mother Eliane Decree         Congrestive heart failure due to cancer   . Hypertension Mother Eliane Decree    . Psychiatry Mother Eliane Decree         Bi polar, manic depressive   . Cancer Father Warren Danes         Prostate   . Diabetes Father Warren Danes    . Stroke Father Warren Danes    . Stroke Maternal Grandmother Winifred Jeani Hawking    . Cancer Brother Dian Situ         Skin (many locations)   .  Diabetes Brother Dian Situ        Patient  denies family history of: colorectal cancer, Crohn's disease and ulcerative colitis    Review of Systems: A complete review of systems was performed and is negative except as documented in HPI.    Physical Exam:  BP 121/66 (BP Location: Right arm, BP Patient Position: Sitting, BP cuff size: Regular)   Pulse 81   Temp 97.3 F (36.3 C) (Temporal)   Resp 16   Ht 5' 2.5" (1.588 m)   Wt 59 kg (130 lb)   SpO2 96%   BMI 23.40 kg/m   General Appearance: healthy, alert, no distress, pleasant affect, cooperative, skin warm, dry, and pink.  Eyes:  Conjunctivae and corneas clear. EOM's intact. Fundi benign.  Mouth: Normal.  Neck:  Neck supple. No adenopathy.  Heart:  Normal rate and regular rhythm  Lungs: No increased work of breathing.  Abdomen:soft, non-tender, non-distended, well-healed midline scar, left sided colostomy pink and patent   Extremities:  No cyanosis, clubbing, or edema.  Skin:  Negative.        Assessment and Care Plan:    ICD-10-CM ICD-9-CM    1. Status post Hartmann procedure (CMS-HCC)  Z93.3 V44.3 Place Patient on ERAS Protocol - Ambulatory      Mytonomy, Enhanced Recovery After Surgery (ERAS)      Mytonomy, Nose To Toes      Mytonomy, Anesthesia      Nursing Misc Order:      polyethylene glycol (GLYCOLAX) 17 GM/SCOOP powder      neomycin 500 MG tablet      metroNIDAZOLE (FLAGYL) 500 MG tablet      Type & Screen Lavender      Case Request: Open colostomy takedown with colorectal anastomosis, possible diverting loop ileostomy      Mytonomy, JP Drain      Glycosylated Hgb(A1C), Blood Lavender      2. 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 Type 2 diabetes mellitus without complication, unspecified whether long term insulin use (CMS-HCC)  E11.9 250.00 Glycosylated Hgb(A1C), Blood Lavender        The risks, benefits and alternatives were discussed with the patient.  The patient has a history of open Hartman procedure for perforated diverticulitis.  Subsequently she's developed a large parastomal hernia containing the majority of her colon.  Patient states she had a colonoscopy approximately 7-10 years ago.  We discussed that a preoperative colonoscopy would typically be indicated but considering the angulation/tortuosity of the colon within the contained parastomal hernia sac, I would not recommend a colonoscopy as it would be a high risk for perforation.  The patient states her quality of life is poorly affected by the enlarging parastomal hernia and would like to proceed with colostomy takedown.  The patient agreed to undergo an open colostomy takedown with colorectal anastomosis, possible diverting  loop ileostomy.  The surgical risks included but not limited to surgical site infection, bleeding, intra-abdominal infection requiring percutaneous or surgical drainage, bowel obstruction, incisional hernia, anastomotic leak requiring percutaneous drainage or surgical re-operation and stoma creation, heart attack, stroke, death and anesthesia-related complications.       Open colostomy takedown with colorectal anastomosis, possible diverting loop ileostomy  Combined case with Dr. Hillery Hunter for parastomal hernia repair  Follow-up bloodwork ordered  Will request colonoscopy report  Will need preoperative pulmonary evaluation and clearance    Note Author: Astrid Divine, MD

## 2021-06-27 ENCOUNTER — Telehealth (HOSPITAL_BASED_OUTPATIENT_CLINIC_OR_DEPARTMENT_OTHER): Payer: Self-pay | Admitting: Registered Nurse

## 2021-06-27 ENCOUNTER — Telehealth (HOSPITAL_BASED_OUTPATIENT_CLINIC_OR_DEPARTMENT_OTHER): Payer: Self-pay | Admitting: Nurse Practitioner

## 2021-06-27 NOTE — Telephone Encounter (Signed)
[facility] RN Triage Note:     RN was given direction by provider to deliver results.  RN reached out to deliver the following information:      Per NP: Recent chest report. Overall shows some nodular improvement. For now she should continue with MAC antibiotics and air way clearance as discussed during the last visit.     NP will forward this CT chest result to [name].     Patient v/u and appreciated the call.    Angela Nevin MSN, RN  [facility]  P: [phone]

## 2021-06-27 NOTE — Telephone Encounter (Signed)
Please call patient. I have received her recent chest report. Overall shows some nodular improvement. For now she should continue with MAC antibiotics and air way clearance as discussed during the last visit.     I will forward this CT chest result to [name].     Thank you      . 1-Post interval right upper lobectomy with a small residual right hydropneumothorax.      2. Scattered tree-in-bud airspace opacities throughout the lungs, similar to slightly improved.  Previously seen patchy groundglass opacities within the lungs appear improved.    3. Mediastinal lymphadenopathy.  This appears slightly increased in comparison to the prior study.

## 2021-06-27 NOTE — Telephone Encounter (Signed)
Caller: Dr. Tiburcio Pea   Relationship to patient: [facility]   Phone # [phone]  Provider: Orlin Hilding   Notes:     Dr. Tiburcio Pea is calling to provide Dr. Orlin Hilding with a finding. Dr. Tiburcio Pea would like a call back.  Please assist.

## 2021-06-28 ENCOUNTER — Ambulatory Visit (HOSPITAL_BASED_OUTPATIENT_CLINIC_OR_DEPARTMENT_OTHER): Payer: Medicare Other

## 2021-06-28 DIAGNOSIS — Z79899 Other long term (current) drug therapy: Secondary | ICD-10-CM

## 2021-06-28 LAB — ECG 12-LEAD
ATRIAL RATE: 75 {beats}/min
ECG INTERPRETATION: NORMAL
P AXIS: 57 degrees
PR INTERVAL: 164 ms
QRS INTERVAL/DURATION: 72 ms
QT: 388 ms
QTc (Bazett): 433 ms
R AXIS: 71 degrees
T AXIS: 71 degrees
VENTRICULAR RATE: 75 {beats}/min

## 2021-06-29 ENCOUNTER — Telehealth (HOSPITAL_BASED_OUTPATIENT_CLINIC_OR_DEPARTMENT_OTHER): Payer: Self-pay

## 2021-06-29 ENCOUNTER — Encounter (HOSPITAL_BASED_OUTPATIENT_CLINIC_OR_DEPARTMENT_OTHER): Payer: Self-pay | Admitting: Hospital

## 2021-06-29 NOTE — Telephone Encounter (Signed)
I LVM to remind pt of esophageal manometry appt on 07/03/21 with check in time of  8:45, fasting prep instructions, bring completed questionnaire instructions. Ask patient to call back to confirmed appointment. Tiffany Chen

## 2021-07-02 ENCOUNTER — Ambulatory Visit
Admission: RE | Admit: 2021-07-02 | Discharge: 2021-07-02 | Disposition: A | Discharge status: Home / Self Care | Payer: Medicare Other | Attending: Allergy | Admitting: Allergy

## 2021-07-02 ENCOUNTER — Telehealth (HOSPITAL_BASED_OUTPATIENT_CLINIC_OR_DEPARTMENT_OTHER): Payer: Self-pay | Admitting: General Surgery

## 2021-07-02 VITALS — BP 120/71 | HR 64 | Temp 97.8°F | Resp 16 | Wt 133.8 lb

## 2021-07-02 DIAGNOSIS — D809 Immunodeficiency with predominantly antibody defects, unspecified: Secondary | ICD-10-CM | POA: Insufficient documentation

## 2021-07-02 MED ORDER — CETIRIZINE HCL 10 MG OR TABS
10.0000 mg | ORAL_TABLET | Freq: Once | ORAL | Status: AC
Start: 2021-07-02 — End: 2021-07-02
  Administered 2021-07-02: 10 mg via ORAL
  Filled 2021-07-02: qty 1

## 2021-07-02 MED ORDER — DEXTROSE 5 % IV SOLN
INTRAVENOUS | Status: DC
Start: 2021-07-02 — End: 2021-07-03

## 2021-07-02 MED ORDER — ACETAMINOPHEN 325 MG PO TABS
650.0000 mg | ORAL_TABLET | Freq: Once | ORAL | Status: AC
Start: 2021-07-02 — End: 2021-07-02
  Administered 2021-07-02: 650 mg via ORAL
  Filled 2021-07-02: qty 2

## 2021-07-02 MED ORDER — IMMUNE GLOBULIN (HUMAN) 20 GM/200ML IJ SOLN
0.5000 g/kg | Freq: Once | INTRAMUSCULAR | Status: AC
Start: 2021-07-02 — End: 2021-07-02
  Administered 2021-07-02: 25 g via INTRAVENOUS
  Filled 2021-07-02: qty 200

## 2021-07-02 NOTE — Telephone Encounter (Signed)
I LVM to remind pt of esophageal manometry appt on 07/02/2021. Also went over check-in time of 0845, fasting prep instructions, bring completed questionnaire, and pay for 3 hour parking prior to entering building.

## 2021-07-02 NOTE — Interdisciplinary (Signed)
Non-Chemotherapy Infusion Nursing Note -  [facility]    Tiffany Chen is a 76 year old female who presents for infusion of IVIG.    Vitals:    07/02/21 1255 07/02/21 1604   BP: 132/67 120/71   BP Location: Left arm Right arm   BP Patient Position: Sitting Sitting   Pulse: 74 64   Resp: 18 16   Temp: 97.5 F (36.4 C) 97.8 F (36.6 C)   TempSrc: Temporal Temporal   SpO2: 99% 100%   Weight: 60.7 kg (133 lb 13.1 oz)        Pre-treatment nursing assessment:  No problems identified upon assessment.  Patient arrived ambulatory in stable condition.   Denies pain.   Denies nausea or vomiting.   Denies diarrhea or constipation.   Denies fevers or recent illness.     Medications   dextrose 5% infusion (0 mL IntraVENOUS Stopped 07/02/21 1606)   acetaminophen (TYLENOL) tablet 650 mg (650 mg Oral Given 07/02/21 1258)   cetirizine (ZYRTEC) tablet 10 mg (10 mg Oral Given 07/02/21 1258)   immune globulin (human) (IGG) 25 g in 250 mL (GAMUNEX-C) infusion (0 g IntraVENOUS Completed 07/02/21 1604)       Tiffany Chen tolerated treatment well.    Post blood return: Brisk  Post-Flush: NS    Patient Education  Learner: Patient  Barriers to learning: No Barriers  Readiness to learn: Acceptance  Method: Explanation    Treatment Education: Information/teaching given to patient including: signs and symptoms of infection, bleeding, adverse reaction(s), symptom control, and when to notify MD.    Tiffany Chen Prevention Education: Instructed patient to call for assistance.    Pain Education: Patient instructed to contact nurse if pain should develop or if their current pain therapy becomes ineffective.    Response: Verbalizes understanding    Discharge Plan  Discharge instructions given to patient.  Future appointments requested.  Discharge Mode: Ambulatory  Discharge Time: 1613  Accompanied by: Self  Discharged To: Home

## 2021-07-03 ENCOUNTER — Ambulatory Visit
Admission: RE | Admit: 2021-07-03 | Discharge: 2021-07-03 | Disposition: A | Discharge status: Home / Self Care | Payer: Medicare Other | Attending: General Surgery | Admitting: General Surgery

## 2021-07-03 ENCOUNTER — Ambulatory Visit (HOSPITAL_BASED_OUTPATIENT_CLINIC_OR_DEPARTMENT_OTHER): Payer: Medicare Other

## 2021-07-03 DIAGNOSIS — K219 Gastro-esophageal reflux disease without esophagitis: Secondary | ICD-10-CM | POA: Insufficient documentation

## 2021-07-03 NOTE — Patient Instructions (Signed)
GI Motility and Physiology Discharge Instructions    You had Esophageal Manometry test done at [facility].       -A local anesthetic was administered to your throat and nose to numb the area.     -Please do not eat or drink for 1 hour after the test.     -If you experience some throat discomfort, use over-the-counter throat lozenges  (e.g., Cepacol maximum strength to alleviate the discomfort.)      Please contact your referring provider in 10 business days to discuss your test results.     I have reviewed and understand my discharge instructions.  I have had any questions answered.     If you have any questions, please call the [facility] at [phone].    After hours of weekends call [phone] and ask for the GI Fellow on call for emergency questions.      Thank you for allowing us to participate in your care.    Beth RN

## 2021-07-04 DIAGNOSIS — K219 Gastro-esophageal reflux disease without esophagitis: Secondary | ICD-10-CM

## 2021-07-05 ENCOUNTER — Ambulatory Visit (HOSPITAL_BASED_OUTPATIENT_CLINIC_OR_DEPARTMENT_OTHER): Payer: Medicare Other

## 2021-07-12 ENCOUNTER — Ambulatory Visit (HOSPITAL_BASED_OUTPATIENT_CLINIC_OR_DEPARTMENT_OTHER): Payer: Medicare Other

## 2021-07-17 ENCOUNTER — Ambulatory Visit (HOSPITAL_BASED_OUTPATIENT_CLINIC_OR_DEPARTMENT_OTHER): Payer: Medicare Other

## 2021-07-19 ENCOUNTER — Ambulatory Visit (HOSPITAL_BASED_OUTPATIENT_CLINIC_OR_DEPARTMENT_OTHER): Payer: Medicare Other

## 2021-07-20 ENCOUNTER — Other Ambulatory Visit (INDEPENDENT_AMBULATORY_CARE_PROVIDER_SITE_OTHER): Payer: Self-pay | Admitting: Critical Care Medicine

## 2021-07-20 DIAGNOSIS — A310 Pulmonary mycobacterial infection: Secondary | ICD-10-CM

## 2021-07-20 DIAGNOSIS — J984 Other disorders of lung: Secondary | ICD-10-CM

## 2021-07-20 DIAGNOSIS — J479 Bronchiectasis, uncomplicated: Secondary | ICD-10-CM

## 2021-07-23 ENCOUNTER — Encounter (INDEPENDENT_AMBULATORY_CARE_PROVIDER_SITE_OTHER): Payer: Self-pay | Admitting: General Surgery

## 2021-07-23 ENCOUNTER — Encounter (INDEPENDENT_AMBULATORY_CARE_PROVIDER_SITE_OTHER): Payer: Self-pay | Admitting: Nurse Practitioner
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
## 2021-07-24 ENCOUNTER — Other Ambulatory Visit (HOSPITAL_BASED_OUTPATIENT_CLINIC_OR_DEPARTMENT_OTHER): Payer: Medicare Other

## 2021-07-24 ENCOUNTER — Ambulatory Visit: Payer: Medicare Other | Attending: General Surgery | Admitting: General Surgery

## 2021-07-24 ENCOUNTER — Ambulatory Visit (HOSPITAL_BASED_OUTPATIENT_CLINIC_OR_DEPARTMENT_OTHER): Payer: Medicare Other

## 2021-07-24 ENCOUNTER — Telehealth (INDEPENDENT_AMBULATORY_CARE_PROVIDER_SITE_OTHER): Payer: Self-pay | Admitting: General Surgery

## 2021-07-24 ENCOUNTER — Telehealth (INDEPENDENT_AMBULATORY_CARE_PROVIDER_SITE_OTHER): Payer: Self-pay

## 2021-07-24 VITALS — BP 128/74 | HR 76 | Temp 97.7°F | Resp 16 | Ht 62.5 in | Wt 134.7 lb

## 2021-07-24 DIAGNOSIS — K219 Gastro-esophageal reflux disease without esophagitis: Secondary | ICD-10-CM | POA: Insufficient documentation

## 2021-07-24 DIAGNOSIS — Z933 Colostomy status: Secondary | ICD-10-CM | POA: Insufficient documentation

## 2021-07-24 DIAGNOSIS — K439 Ventral hernia without obstruction or gangrene: Secondary | ICD-10-CM | POA: Insufficient documentation

## 2021-07-24 DIAGNOSIS — J479 Bronchiectasis, uncomplicated: Secondary | ICD-10-CM

## 2021-07-24 LAB — COMPREHENSIVE METABOLIC PANEL, BLOOD
ALT (SGPT): 26 U/L (ref 0–33)
AST (SGOT): 30 U/L (ref 0–32)
Albumin: 4 g/dL (ref 3.5–5.2)
Alkaline Phos: 55 U/L (ref 40–130)
Anion Gap: 11 mmol/L (ref 7–15)
BUN: 14 mg/dL (ref 8–23)
Bicarbonate: 25 mmol/L (ref 22–29)
Bilirubin, Tot: 0.3 mg/dL (ref <1.2)
Calcium: 10 mg/dL (ref 8.5–10.6)
Chloride: 104 mmol/L (ref 98–107)
Creatinine: 0.76 mg/dL (ref 0.51–0.95)
Glucose: 138 mg/dL — ABNORMAL HIGH (ref 70–99)
Potassium: 4.6 mmol/L (ref 3.5–5.1)
Sodium: 140 mmol/L (ref 136–145)
Total Protein: 7 g/dL (ref 6.0–8.0)
eGFR Based on CKD-EPI 2021 Equation: >60 mL/min/{1.73_m2}

## 2021-07-24 LAB — CBC WITH DIFF, BLOOD
ANC-Automated: 2.6 10*3/uL (ref 1.6–7.0)
Abs Basophils: 0 10*3/uL (ref <0.2)
Abs Eosinophils: 0.1 10*3/uL (ref 0.0–0.5)
Abs Lymphs: 1.1 10*3/uL (ref 0.8–3.1)
Abs Monos: 0.5 10*3/uL (ref 0.2–0.8)
Basophils: 1 %
Eosinophils: 3 %
Hct: 38 % (ref 34.0–45.0)
Hgb: 12.4 gm/dL (ref 11.2–15.7)
Lymphocytes: 25 %
MCH: 29.1 pg (ref 26.0–32.0)
MCHC: 32.6 g/dL (ref 32.0–36.0)
MCV: 89.2 um3 (ref 79.0–95.0)
MPV: 10.4 fL (ref 9.4–12.4)
Monocytes: 12 %
Plt Count: 239 10*3/uL (ref 140–370)
RBC: 4.26 10*6/uL (ref 3.90–5.20)
RDW: 14.8 % — ABNORMAL HIGH (ref 12.0–14.0)
Segs: 60 %
WBC: 4.4 10*3/uL (ref 4.0–10.0)

## 2021-07-24 MED ORDER — ETHAMBUTOL HCL 400 MG OR TABS
ORAL_TABLET | ORAL | 11 refills | Status: DC
Start: 2021-07-24 — End: 2022-06-13

## 2021-07-24 MED ORDER — RIFAMPIN 300 MG OR CAPS
ORAL_CAPSULE | ORAL | 11 refills | Status: DC
Start: 2021-07-24 — End: 2022-06-13

## 2021-07-24 MED ORDER — AZITHROMYCIN 250 MG OR TABS
ORAL_TABLET | ORAL | 11 refills | Status: DC
Start: 2021-07-24 — End: 2022-06-13

## 2021-07-24 NOTE — Telephone Encounter (Signed)
GI Nurse Referral Review: Routed STAT referral to Dr. Neomia Glass to advise on scheduling.  Patient is established with Dr. Radene Knee.    Referring provider: Diona Browner, MD Mon [facility]    Indications: Dr. Neomia Glass    Appointment For: Motility: Esophageal      Pertinent Medical Hx associated with referral: Dr. Hillery Chen 07/24/21 (note unfinished) -    Assessment/Plan:  Tiffany Chen is a 76 year old female, Body mass index is 24.24 kg/m., with paraesophageal hernia and GERD with history of MAC in RUL requiring lobectomy.    Possible that MAC could be related to reflux, although definitely no guarantee that anti-reflux operation will "cure" her of MAC. She also has underlying immunosuppression issues. Began the discussion regarding risks/benefits/alternatives to surgery and that [place] repair is a significant undertaking with potential serious risks. Complicating matters is her abdominal wall hernia and colostomy status which may limit access to the abdomen for antireflux surgery. She is interested in potential reversal of her colostomy, although she has lived with this for many years. Will refer to Dr. Astrid Divine for eval if ostomy reversal is feasible and safe, and I could repair her stoma site hernia at the same time. May have to do procedures in staged fashion, if she is a candidate and elects to proceed with them at all. If abdomen is not accessible safely, may need referral to thoracic surgery for consideration of a thorascopic [place] procedure if aggressively treating with anti-reflux surgery.    -Talk to GI about MAC in setting of asymptomatic reflux   -Laparotomy ostomy takedown hernia repair, possible component separation       Last visit in this department 10/12/2020 Dr. Radene Knee:    Assessment and Plan:  Tiffany Chen is a 76 year old with a PMHx as below significant for pulmonary MAC, DMII, who now presents for GERD.    # GERD:  - EGD with pH BRavo  - Hold PPI for at least 7 days prior to  procedure  - Lifestyle changes discussed    # Colon cancer screening: Normal in 2015  -  Repeat screening in 2027 if in good health    Next visit in this department Visit date not found    Esophageal Manometry 07/03/21 (Dr. Neomia Glass) -     Interpretation / Findings  Elevated UES resting pressure and normal UES relaxation pressures  Esophago-gastric junction: Respiratory inversion point present. No separation between the LES and crural diaphragm; no manometric hiatal hernia  EGJ/LES Relaxation: Elevated median IRP in the supine position and normal median IRP in the upright position; no intrabolus pressurization  Esophageal peristalsis: In the supine position 100% peristalsis is intact  Bolus clearance: 70% complete  Intact response to multiple rapid swallow and rapid drink challenge    Impressions  -No manometric hiatal hernia  -Overall the study supports normal LES relaxation given normal IRP in the upright position, relatively preserved bolus clearance and lack of intrabolus pressurization. The elevated IRP in the supine position is likely artefactual. However if there is concern about an outflow obstruction further evaluation with barium esophagram with tablet and/or FLIP would be recommended.  -Intact esophageal peristalsis.  There is no disorder of EGJ outflow or esophageal peristalsis per [facility] Classification version 4.0.    02/01/21 EGD Bravo (Dr. Radene Knee)  ENDOSCOPIC DIAGNOSIS  1) Mild gastropathy and duodenopathy with reactive appearance. Gastric biopsies obtained with cold forceps.  2) Successful placement of pH Bravo device as described    RECOMMENDATIONS  1) Follow instructions for pH Bravo study  2) You can resume Omeprazole after the pH bravo study is completed    FINAL PATHOLOGIC DIAGNOSIS:   A: Gastric biopsy:   -Gastric antral mucosa with reactive gastropathy and reactive epithelial   atypia.   -Gastric fundic mucosa with no diagnostic alteration.   -No Helicobacter organisms identified on routine  stains.   -No intestinal metaplasia, dysplasia, or carcinoma identified.     Interpretations (Dr. Neomia Glass)  Total distal esophageal acid exposure time OFF PPI: Severely elevated (13.4%); supine predominant Insufficient symptoms reported to assess for symptom reflux association  Daily distal esophageal acid exposure: Elevated across all four days of monitoring Prolonged wireless pH monitoring OFF PPI meeting criteria for pathologic acidic GERD

## 2021-07-24 NOTE — Progress Notes (Signed)
Minimally Invasive Surgery New Consultation  Date: July 24, 2021     Patient Name: Tiffany Chen   Medical Record #: [id number]   DOB: 09-Apr-1945  Age: 76 year old  Sex: female    Referring MD: Katheren Shams, MD  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 Berniece Andreas in 03/2021 and there is some concern that the GERD is contributing to the MAC infections.     Was last seen in clinic on 6/6 at which time she was recommended to undergo barium swallow, manometry, and CT with IV and rectal contrast then return to clinic to discuss surgical options again.     Her manometry was found to be overall normal. Barium swallow not yet completed, but is scheduled for August 1st. Patient states that she continues to be asymptomatic from her reflux as long as she takes her PPI. She is overall doing well from a respiratory standpoint. 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 Would prefer not to undergo anti-reflux surgery unless indicated as treatment against MAC infections.     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 Berniece Andreas in 03/2021 for MAC    Allergies  Allergies   Allergen Reactions    [facility] Flavor Other       Medications  Current Outpatient Medications   Medication Sig Dispense Refill    Amikacin Sulfate Liposome (ARIKAYCE) 590 MG/8.4ML SUSP 590 mg by Nebulization route daily. 252 mL 11    atorvastatin (LIPITOR) 10 MG tablet Take 1 tablet (10 mg) by mouth daily.      azithromycin (ZITHROMAX) 250 MG tablet Take 1 tablet (250 mg) by mouth daily. 30 tablet 11    CLOFAZIMINE IRB [id number] 50 MG CAPSULE Take 2 capsules (117m) by mouth once daily with  food as directed. 200 capsule 0    ethambutol (MYAMBUTOL) 400 MG tablet Take 2 tablets (800 mg) by mouth daily. 60 tablet 11    ferrous sulfate 325 (65 Fe) MG tablet Take 1 tablet (325 mg) by mouth 3 times daily.      fluoxetine (PROZAC) 40 MG capsule Take 1 capsule (40 mg) by mouth daily. 90 capsule 3    metFORMIN (GLUCOPHAGE) 500 mg tablet Take 1 tablet (500 mg) by mouth 2 times daily (with meals).      neomycin 500 MG tablet Take 2 tablets (1000 mg) by mouth at noon and 2 tablets (1000 mg) at 8 PM on the day prior to surgery. 4 tablet 0    omeprazole (PRILOSEC) 20 MG capsule TAKE 1 CAPSULE BY MOUTH IN  THE MORNING BEFORE  BREAKFAST 90 capsule 3    Pediatric Multivitamins-Fl (MULTIVITAMINS/FL PO)       polyethylene  glycol (GLYCOLAX) 17 GM/SCOOP powder Mix entire bottle in 2 liters of Gatorade and begin drinking at noon on the day prior to surgery. 238 g 0    ramipril (ALTACE) 2.5 MG capsule Take 1 capsule (2.5 mg) by mouth daily.      rifampin (RIFADIN) 300 MG capsule Take 2 capsules (600 mg) by mouth daily. 60 capsule 11    sodium chloride 7 % NEBU 4 mL by Nebulization route every 12 hours. 240 mL 11    VENTOLIN HFA 108 (90 Base) MCG/ACT inhaler USE 2 INHALATIONS BY MOUTH  EVERY 6 HOURS AS NEEDED FOR WHEEZING 72 g 3     Current Facility-Administered Medications   Medication Dose Route Frequency Provider Last Rate Last Admin    lidocaine (XYLOCAINE) 2 % viscous solution 1 mL  1 mL Other PRN Broderick, Ethelle Lyon, MD        lidocaine (XYLOCAINE) 4 % topical   Other PRN Broderick, Ethelle Lyon, MD           Social History  Social History     Socioeconomic History    Marital status: Widowed   Tobacco Use    Smoking status: Never    Smokeless tobacco: Never   Substance and Sexual Activity    Alcohol use: Not Currently    Drug use: Not Currently    Sexual activity: Not Currently   Social Activities of Daily Living Present    Military Service No    Blood Transfusions No    Caffeine Concern No    Occupational Exposure No    Hobby Hazards Yes     Comment: Gardening, MAC    Sleep Concern No    Stress Concern Yes     Comment: Husband death Cov19, 2020, sold home, rehomed pets. [place]    Weight Concern No    Special Diet Yes    Back Care No    Exercises Regularly Yes    Bike Helmet Use No    Seat Belt Use No    Performs Self-Exams No   Social History Narrative    Lives with brother Tiffany Chen at home. Sister also lives in [place] History  Family History   Problem Relation Name Age of Onset    Cancer Mother Tiffany Chen ngs and throat    Heart Disease Mother Tiffany Chen         Congrestive heart failure due to cancer    Hypertension Mother Tiffany Chen  Psychiatry Mother Tiffany Chen         Bi polar, manic depressive    Cancer  Father Tiffany Chen         Prostate    Diabetes Father Tiffany Chen     Stroke Father Tiffany Chen     Stroke Maternal Grandmother Tiffany Chen     Cancer Brother Tiffany Chen         Skin (many locations)    Diabetes Brother Tiffany Chen        Review of Systems    As described in HPI.    Physical Examination  BP 128/74 (BP Location: Right arm, BP Patient Position: Sitting, BP cuff size: Regular)   Pulse 76   Temp 97.7 F (36.5 C) (Temporal Artery)   Resp 16   Ht 5' 2.5" (1.588 m)   Wt 61.1 kg (134 lb 11.2 oz)   BMI 24.24 kg/m  Body mass index is 24.24 kg/m.  GENERAL: alert, oriented x 3, no acute distress   CHEST: unlabored respirations   ABDOMEN: soft, non tender, colostomy in place with large surrounding parastomal hernia    Diagnostic Testing/Labs:  Lab Results   Component Value Date    WBC 6.0 06/19/2021    RBC 4.25 06/19/2021    HGB 12.2 06/19/2021    HCT 37.3 06/19/2021    MCV 87.8 06/19/2021    MCHC 32.7 06/19/2021    RDW 15.2 (H) 06/19/2021    PLT 268 06/19/2021    MPV 10.5 06/19/2021    SEG 66 06/19/2021    LYMPHS 20 06/19/2021    MONOS 10 06/19/2021    EOS 3 06/19/2021    BASOS 1 06/19/2021     Lab Results   Component Value Date    BUN 19 06/19/2021    CREAT 0.76 06/19/2021    CL 101 06/19/2021    NA 138 06/19/2021    K 4.3 06/19/2021    [place] 9.7 06/19/2021    TBILI 0.37 06/19/2021    ALB 4.0 06/19/2021    TP 7.3 06/19/2021    AST 30 06/19/2021    ALK 60 06/19/2021    BICARB 23 06/19/2021    ALT 34 (H) 06/19/2021    GLU 124 (H) 06/19/2021     Lab Results   Component Value Date    NA 138 06/19/2021    K 4.3 06/19/2021    CL 101 06/19/2021    BICARB 23 06/19/2021    BUN 19 06/19/2021    CREAT 0.76 06/19/2021    GLU 124 (H) 06/19/2021    [place] 9.7 06/19/2021     Lab Results   Component Value Date    A1C 6.8 (H) 06/26/2021     Lab Results   Component Value Date    CHOL 163 05/16/2021    HDL 53 05/16/2021    LDLCALC 81 05/16/2021    TRIG 145 05/16/2021     No results found for:  TSH      Imaging/Procedures:    Manometry completed 6/27  Barium swallow scheduled for 8/1    EGD with Bravo: 13.2% acid exposure with demeesters 40-60.      CT Chest W/O Contrast    Result Date: 06/26/2021  IMPRESSION: 1. Post interval right upper lobectomy with a small residual right hydropneumothorax.    2. Scattered tree-in-bud airspace opacities throughout the lungs, similar to slightly improved.  Previously seen patchy groundglass opacities within the lungs appear improved.   3. Mediastinal  lymphadenopathy.  This appears slightly increased in comparison to the prior study.  DOSE STATEMENT: [facility] CT scanners employ modern techniques for CT dose reduction, including protocol review, automatic exposure control, and iterative reconstruction techniques.  These features assure that radiation dose levels in CT are optimized and are consistent with state-of-the-art, low dose CT practice.  Electronically signed by Dr. Bethena Midget, MD,PhD    CT Abdomen And Pelvis WO/W Contrast    Result Date: 06/23/2021  IMPRESSION: CT scan of the abdomen and pelvis with and without IV contrast. No leak at the [facility] pouch Large peristomal hernia with a wide orifice     MR Angiogram Brain W/WO Contrast    Result Date: 05/30/2021  IMPRESSION: 1. 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 No findings to suggest recurrence. 2. No evidence of new intradural aneurysms. 3. No significant intracranial arterial stenosis.     X-Ray Abdomen Single View    Result Date: 05/28/2021  IMPRESSION: 1. No radiopaque foreign body.     X-RAY Dexa (Bone Density) Skeletal    Result Date: 05/18/2021  IMPRESSION: According to the [place] the classification is, low bone mass. Please contact Dr. Lynnae Prude with any question regarding this study at pager: [phone]       Assessment/Plan:  Izzah Pasqua is a 76 year old female, Body mass index is  24.24 kg/m., with asymptomatic GERD with history of RUL MAC requiring lobectomy as well as a symptomatic parastomal hernia:.    Possible that MAC could be related to reflux, although definitely no guarantee that anti-reflux operation will "cure" her of MAC.     -Consented for open ventral hernia repair with component separation, possible mesh to be performed concurrently with colostomy takedown (Dr. Astrid Divine)   -Will discuss with pulmonology and will refer patient to discuss with GI (Dr. Neomia Glass) regarding MAC in setting of asymptomatic reflux and whether the reflux is truly thought to be a significant contributor. If so, can potentially pursue anti-reflux surgery at the time of ostomy takedown and hernia repair but this would add significant complexity and possible morbidity     Patient seen and discussed with attending Dr. Hillery Hunter who agrees with the above.    Gus Puma, MD  General Surgery Resident, R5

## 2021-07-24 NOTE — Telephone Encounter (Signed)
Caller: pt   Relationship to patient: self  Phone # [phone]  Preferred Method of Communication: call    Notes: pt calling has apt today 7/18 at 1pm. Would like to inform swallow study has not been completed and is scheduled for 8/1. Informed per MD notes, would like to see pt after all testing is completed to discuss surgery. Next available apt in oct. Pt declined and became upset and stated this is unacceptable as was never informed this test was needed in time.      Pt would like to know if she is able to be seen today without the swallow study or be accommodated for an apt after swallow study on 8/1.     Please assist     Caller has been advised of  72 hr turnaround time.

## 2021-07-25 ENCOUNTER — Encounter (INDEPENDENT_AMBULATORY_CARE_PROVIDER_SITE_OTHER): Payer: Self-pay | Admitting: Nurse Practitioner

## 2021-07-25 ENCOUNTER — Ambulatory Visit (INDEPENDENT_AMBULATORY_CARE_PROVIDER_SITE_OTHER): Payer: Medicare Other | Admitting: Nurse Practitioner

## 2021-07-25 VITALS — BP 119/67 | HR 70 | Ht 62.5 in | Wt 134.8 lb

## 2021-07-25 DIAGNOSIS — F419 Anxiety disorder, unspecified: Secondary | ICD-10-CM

## 2021-07-25 DIAGNOSIS — F39 Unspecified mood [affective] disorder: Secondary | ICD-10-CM

## 2021-07-25 NOTE — Telephone Encounter (Signed)
Loney Laurence, MD  You 12 hours ago (8:32 PM)     Can see me and Corrie Dandy in Thursday video clinic      Updated referral shell.

## 2021-07-26 ENCOUNTER — Ambulatory Visit (HOSPITAL_BASED_OUTPATIENT_CLINIC_OR_DEPARTMENT_OTHER): Payer: Medicare Other

## 2021-07-29 ENCOUNTER — Encounter (INDEPENDENT_AMBULATORY_CARE_PROVIDER_SITE_OTHER): Payer: Self-pay | Admitting: Nurse Practitioner

## 2021-07-30 ENCOUNTER — Ambulatory Visit
Admission: RE | Admit: 2021-07-30 | Discharge: 2021-07-30 | Disposition: A | Discharge status: Home / Self Care | Payer: Medicare Other | Attending: Allergy | Admitting: Allergy

## 2021-07-30 VITALS — BP 121/66 | HR 64 | Temp 96.2°F | Resp 17 | Wt 136.5 lb

## 2021-07-30 DIAGNOSIS — D809 Immunodeficiency with predominantly antibody defects, unspecified: Secondary | ICD-10-CM | POA: Insufficient documentation

## 2021-07-30 MED ORDER — DEXTROSE 5 % IV SOLN
INTRAVENOUS | Status: DC
Start: 2021-07-30 — End: 2021-07-31

## 2021-07-30 MED ORDER — IMMUNE GLOBULIN (HUMAN) 20 GM/200ML IJ SOLN
0.5000 g/kg | Freq: Once | INTRAMUSCULAR | Status: AC
Start: 2021-07-30 — End: 2021-07-30
  Administered 2021-07-30: 25 g via INTRAVENOUS
  Filled 2021-07-30: qty 200

## 2021-07-30 MED ORDER — DIPHENHYDRAMINE HCL 25 MG OR TABS OR CAPS CUSTOM
25.0000 mg | ORAL_CAPSULE | Freq: Once | ORAL | Status: DC | PRN
Start: 2021-07-30 — End: 2021-08-03

## 2021-07-30 MED ORDER — CETIRIZINE HCL 10 MG OR TABS
10.0000 mg | ORAL_TABLET | Freq: Once | ORAL | Status: AC
Start: 2021-07-30 — End: 2021-07-30
  Administered 2021-07-30: 10 mg via ORAL
  Filled 2021-07-30: qty 1

## 2021-07-30 MED ORDER — ACETAMINOPHEN 325 MG PO TABS
650.0000 mg | ORAL_TABLET | Freq: Once | ORAL | Status: AC
Start: 2021-07-30 — End: 2021-07-30
  Administered 2021-07-30: 650 mg via ORAL
  Filled 2021-07-30: qty 2

## 2021-07-30 NOTE — Interdisciplinary (Signed)
Non-Chemotherapy Infusion Nursing Note - [facility] Tiffany Chen is a 76 year old female who presents for infusion of IVIG 25g.    Vitals:    07/30/21 1238 07/30/21 1521   BP: 127/60 121/66   BP Location: Right arm Left arm   BP Patient Position: Sitting Sitting   Pulse: 85 64   Resp: 16 17   Temp: 97.8 F (36.6 C) 96.2 F (35.7 C)   TempSrc: Temporal Temporal   SpO2: 98% 96%   Weight: 61.9 kg (136 lb 7.4 oz)      Pain Score: NA (pre med, non-pain or scheduled)  Body surface area is 1.65 meters squared.  Body mass index is 24.56 kg/m.    Medications   dextrose 5% infusion (0 mL IntraVENOUS Completed 07/30/21 1521)   diphenhydrAMINE (BENADRYL) tablet 25 mg (has no administration in time range)   acetaminophen (TYLENOL) tablet 650 mg (650 mg Oral Given 07/30/21 1246)   cetirizine (ZYRTEC) tablet 10 mg (10 mg Oral Given 07/30/21 1246)   immune globulin (human) (IGG) 25 g in 250 mL (GAMUNEX-C) infusion (0 g IntraVENOUS Completed 07/30/21 1521)       Pre-treatment nursing assessment:  No problems identified upon assessment. Denies pain, N/V, SOB. VSS. No hx infusion rxn.    Treatment parameters: NA    Tiffany Chen tolerated treatment well. Titrated to max rate as ordered. VSS, no s/s rxn.   Post blood return: Brisk  Post-Flush: NS and Discontinued IV    Patient Education  Learner: Patient  Barriers to learning: No Barriers  Readiness to learn: Acceptance  Method: Explanation    Treatment Education: Information/teaching given to patient including: signs and symptoms of infection, bleeding, adverse reaction(s), symptom control, and when to notify MD.    Tiffany Chen Prevention Education: Instructed patient to call for assistance.    Pain Education: Patient instructed to contact nurse if pain should develop or if their current pain therapy becomes ineffective.    Response: Verbalizes understanding    Discharge Plan  Discharge instructions given to patient.  Future appointment requested  8/21.  Discharge Mode: Ambulatory  Discharge Time: 1521  Accompanied by: Self  Discharged To: Home

## 2021-07-31 ENCOUNTER — Ambulatory Visit (HOSPITAL_BASED_OUTPATIENT_CLINIC_OR_DEPARTMENT_OTHER): Payer: Medicare Other

## 2021-08-01 ENCOUNTER — Ambulatory Visit (INDEPENDENT_AMBULATORY_CARE_PROVIDER_SITE_OTHER): Payer: Medicare Other | Admitting: Allergy

## 2021-08-01 VITALS — BP 119/71 | HR 64 | Temp 97.0°F

## 2021-08-01 DIAGNOSIS — D809 Immunodeficiency with predominantly antibody defects, unspecified: Secondary | ICD-10-CM

## 2021-08-01 DIAGNOSIS — J479 Bronchiectasis, uncomplicated: Secondary | ICD-10-CM

## 2021-08-01 NOTE — Patient Instructions (Signed)
After visit summary:    Continue IVIG at [facility] monthly     Follow-up in 6 months

## 2021-08-01 NOTE — Progress Notes (Signed)
Allergy/Immunology Follow-up    Date: 08/01/2021  ______________________________________________________________________    HPI: Tiffany Chen is a 76 year old female here for follow-up for immunodeficiency. Patient was last seen on 03/19/2021 at which time recommendations were for continuation of IgG replacement therapy Gamunex-C at 25g every 4 weeks. IgG 1030 1 month ago. CBC without cytopenia.    Follow-up 03/19/21: Patient was last seen on 12/18/2020 at which time recommendations were to proceed with IVIG at [facility] starting at 564m/kg. Patient has tolerated IVIG well, and is now s/p 3 infusions. She will be undergoing lung lobectomy on 3/14.     Follow-up 12/18/20: Patient was last seen on 09/07/2020 at which time recommendations were for additional immune work-up including repeat quantitative immunoglobulins as well as vaccine titers (tetanus and pneumococcal). Pneumovax-23 administered 09/22/20. Only 14/23 protective at recheck. Discussion on IVIG replacement therapy today, and [facility]     Initial consultation: Prior medical records have been reviewed carefully prior to this consultation and salient details have been summarized below. Patient has a history of bronchiectasis, cavitary lung infection (MAC). There is no history of chronic sinusitis. She has had multiple rounds of treatment for MAC, with most recent antibiotic regimen including amikacin, azithromycin, ethambutol and rifampin.     Environmental and social history:  Tobacco smoke exposure:   Social History     Tobacco Use   Smoking Status Never   Smokeless Tobacco Never         Past Medical and Surgical History:  Past Medical History:   Diagnosis Date   . Acquired tracheobronchomegaly with bronchiectasis (CMS-HCC) 05/22/2020   . Cavitary lesion of lung 07/24/2020   . Depression    . DM (diabetes mellitus) (CMS-HCC)    . Gastroesophageal reflux disease, unspecified  whether esophagitis present 10/12/2020    Added automatically from request for surgery [id number]   . 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     Past Surgical History:   Procedure Laterality Date   . BRONCHOSCOPY  12/2020   . COLOSTOMY     . LUNG LOBECTOMY         Allergies:  Allergies   Allergen Reactions   . Black [employer] Other       Medications:    Current Outpatient Medications:   .  Amikacin Sulfate Liposome (ARIKAYCE) 590 MG/8.4ML SUSP, 590 mg by Nebulization route daily., Disp: 252 mL, Rfl: 11  .  atorvastatin (LIPITOR) 10 MG tablet, Take 1 tablet (10 mg) by mouth daily., Disp: , Rfl:   .  azithromycin (ZITHROMAX) 250 MG tablet, TAKE 1 TABLET BY MOUTH  DAILY, Disp: 60 tablet, Rfl: 11  .  CLOFAZIMINE IRB 21-0063 50 MG CAPSULE, Take 2 capsules (1030m by mouth once daily with food as directed., Disp: 200 capsule, Rfl: 0  .  ethambutol (MYAMBUTOL) 400 MG tablet, TAKE 2 TABLETS BY MOUTH  DAILY, Disp: 120 tablet, Rfl: 11  .  ferrous sulfate 325 (65 Fe) MG tablet, Take 1 tablet (325 mg)  by mouth 3 times daily., Disp: , Rfl:   .  fluoxetine (PROZAC) 40 MG capsule, Take 1 capsule (40 mg) by mouth daily., Disp: 90 capsule, Rfl: 3  .  metFORMIN (GLUCOPHAGE) 500 mg tablet, Take 1 tablet (500 mg) by mouth 2 times daily (with meals)., Disp: , Rfl:   .  neomycin 500 MG tablet, Take 2 tablets (1000 mg) by mouth at noon and 2 tablets (1000 mg) at 8 PM on the day prior to surgery., Disp: 4 tablet, Rfl: 0  .  omeprazole (PRILOSEC) 20 MG capsule, TAKE 1  CAPSULE BY MOUTH IN  THE MORNING BEFORE  BREAKFAST, Disp: 90 capsule, Rfl: 3  .  Pediatric Multivitamins-Fl (MULTIVITAMINS/FL PO), , Disp: , Rfl:   .  polyethylene glycol (GLYCOLAX) 17 GM/SCOOP powder, Mix entire bottle in 2 liters of Gatorade and begin drinking at noon on the day prior to surgery., Disp: 238 g, Rfl: 0  .  ramipril (ALTACE) 2.5 MG capsule, Take 1 capsule (2.5 mg) by mouth daily., Disp: , Rfl:   .  rifAMPin (RIFADIN) 300 MG capsule, TAKE 2 CAPSULES BY MOUTH  DAILY, Disp: 120 capsule, Rfl: 11  .  sodium chloride 7 % NEBU, 4 mL by Nebulization route every 12 hours., Disp: 240 mL, Rfl: 11  .  VENTOLIN HFA 108 (90 Base) MCG/ACT inhaler, USE 2 INHALATIONS BY MOUTH  EVERY 6 HOURS AS NEEDED FOR WHEEZING, Disp: 72 g, Rfl: 3    Current Facility-Administered Medications:   .  lidocaine (XYLOCAINE) 2 % viscous solution 1 mL, 1 mL, Other, PRN, Broderick, Ethelle Lyon, MD  .  lidocaine (XYLOCAINE) 4 % topical, , Other, PRN, Broderick, Ethelle Lyon, MD    Facility-Administered Medications Ordered in Other Visits:   .  diphenhydrAMINE (BENADRYL) tablet 25 mg, 25 mg, Oral, Once PRN, Maudie Mercury Bedelia Person, MD    FH: No FH of atopic disease: asthma, allergic rhinitis, eczema, food allergies    Diagnostic studies:      IMPRESSION:  1. Post interval right upper lobectomy with a small residual right hydropneumothorax.      2. Scattered tree-in-bud airspace opacities throughout the lungs, similar to slightly improved.  Previously seen patchy groundglass opacities within the lungs appear improved.    3. Mediastinal lymphadenopathy.  This appears slightly increased in comparison to the prior study.          Prior diagnostic studies:   CMP normal liver, total protein  CBC : no lymphopenia or thrombocytopenia                                    Impression:  76 year old here for evaluation of:    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 Bronchiectasis   3. MAC infection    Recommendations:  1.  Immunodeficiency with specific antibody deficiency/defects  2. Bronchiectasis   3. MAC infection  Patient presents for follow-up on immune testing which have confirmed immunodeficiency with antibody defects. Given her history of persistent MAC infection, bronchiectasis, low IgM, low IgG subclass, and poor vaccine titers, advise continuation of IgG replacement therapy. IgG replacement infusion therapy is well tolerated, is medically indicated given the above clinical profile and concerns for evolution to CVID given low IgM and low IgG subclass 2/3.      We have discussed both intravenous and subcutaneous immunoglobulin replacement therapy with the patient and she would like to continue with  IVIG at [facility] - Tuesdays/Wednesdays best days. Risks of immunoglobulin replacement therapy were carefully reviewed with the patient as there are several black box warnings on the products. Although the risks general occur more in high dose immunoglobulin replacement regimens such as in neurological disorders, we have seen severe adverse effects at PID dosing and advise all patients that these risks are real, severe and capable of presenting at any dosing scheme. We have reviewed anaphylaxis, shock, death, pulmonary embolism, DVT, hemolytic disorders, acute renal failure (we have seen acute renal dysfunction but not failure in non-sucrose formulations), aseptic meningitis, TRALI/pulmonary edema, swelling, weight gain and local infusion site reactions/pain.   -Will continue at current dosing Gamunex-C at 25g every 4 weeks  -Recommendations are for immunoglobulin replacement therapy  -Risks, alternatives, benefits discussed as above  -Risk of severe progression of lung disease and life-threatening infections if immunoglobulin replacement is not approved and has been clearly documented above in terms of reasons to support prescription; failure to approve could lead to life-threatening pulmonary consequences   -IgG  check at next infusion    RTC 6 months     The plan of care was discussed with the patient who expressed agreement and understanding. Questions were answered prior to conclusion of the visit.     Durel Salts, MD  Clinical Associate Professor, [facility]

## 2021-08-02 ENCOUNTER — Encounter (INDEPENDENT_AMBULATORY_CARE_PROVIDER_SITE_OTHER): Payer: Medicare Other | Admitting: Allergy

## 2021-08-06 ENCOUNTER — Encounter (INDEPENDENT_AMBULATORY_CARE_PROVIDER_SITE_OTHER): Payer: Self-pay | Admitting: Allergy

## 2021-08-07 ENCOUNTER — Ambulatory Visit
Admission: RE | Admit: 2021-08-07 | Discharge: 2021-08-07 | Disposition: A | Discharge status: Home / Self Care | Payer: Medicare Other | Attending: General Surgery | Admitting: General Surgery

## 2021-08-07 DIAGNOSIS — K219 Gastro-esophageal reflux disease without esophagitis: Secondary | ICD-10-CM | POA: Insufficient documentation

## 2021-08-07 DIAGNOSIS — K224 Dyskinesia of esophagus: Secondary | ICD-10-CM

## 2021-08-07 MED ORDER — BARIUM SULFATE 98 % OR SUSR
50.0000 mL | Freq: Once | ORAL | Status: AC
Start: 2021-08-07 — End: 2021-08-07
  Administered 2021-08-07: 50 mL via ORAL
  Filled 2021-08-07: qty 50

## 2021-08-07 MED ORDER — BARIUM SULFATE 700 MG PO TABS
700.0000 mg | ORAL_TABLET | Freq: Once | ORAL | Status: AC
Start: 2021-08-07 — End: 2021-08-07
  Administered 2021-08-07: 700 mg via ORAL
  Filled 2021-08-07: qty 1

## 2021-08-07 MED ORDER — SOD BICARB-CITRIC AC-SIMETH 2.21-1.53-0.04 G PO PACK
1.0000 | PACK | Freq: Once | ORAL | Status: AC
Start: 2021-08-07 — End: 2021-08-07
  Administered 2021-08-07: 1 via ORAL
  Filled 2021-08-07: qty 1

## 2021-08-07 MED ORDER — BARIUM SULFATE 60 % OR SUSP
80.0000 mL | Freq: Once | ORAL | Status: AC
Start: 2021-08-07 — End: 2021-08-07
  Administered 2021-08-07: 80 mL via ORAL
  Filled 2021-08-07: qty 80

## 2021-08-09 ENCOUNTER — Encounter (HOSPITAL_COMMUNITY): Payer: Self-pay

## 2021-08-15 ENCOUNTER — Other Ambulatory Visit: Payer: Self-pay

## 2021-08-15 ENCOUNTER — Telehealth (HOSPITAL_BASED_OUTPATIENT_CLINIC_OR_DEPARTMENT_OTHER): Payer: Self-pay | Admitting: Critical Care Medicine

## 2021-08-15 ENCOUNTER — Encounter (HOSPITAL_BASED_OUTPATIENT_CLINIC_OR_DEPARTMENT_OTHER): Payer: Self-pay | Admitting: Critical Care Medicine

## 2021-08-15 ENCOUNTER — Ambulatory Visit: Payer: Medicare Other | Attending: Nurse Practitioner | Admitting: Critical Care Medicine

## 2021-08-15 VITALS — BP 123/50 | HR 69 | Temp 98.0°F | Resp 16 | Ht 62.5 in | Wt 137.5 lb

## 2021-08-15 DIAGNOSIS — A319 Mycobacterial infection, unspecified: Secondary | ICD-10-CM | POA: Insufficient documentation

## 2021-08-15 DIAGNOSIS — J479 Bronchiectasis, uncomplicated: Secondary | ICD-10-CM | POA: Insufficient documentation

## 2021-08-15 NOTE — Telephone Encounter (Signed)
MACRN: pt called Pharmacy and Clofazime has just arrived to pharmacy-pt called she will pick up tomorrow.

## 2021-08-15 NOTE — Telephone Encounter (Signed)
Phone call from Dicky Doe from [facility] wanting to speak to RN Royston Bake, informed that Sonny Masters has left for the day. Hazle Quant requested that a message be sent to her regarding medication for pt. The medication has arrived, but a prescription is needed. Message routed to [employer] as requested.

## 2021-08-15 NOTE — Telephone Encounter (Signed)
Pt came into clinic wanting leave a message for [facility] nurse Lindie Spruce. Pt states that her pharmacy has not rec'd the order for Clofazimine. Message sent to [facility] Triage.

## 2021-08-15 NOTE — Telephone Encounter (Signed)
[facility] RN Triage Note:     Routing to MD for advise.      Thi Sisemore MSN, RN  [facility]  P: [phone]

## 2021-08-15 NOTE — Progress Notes (Signed)
Pulmonary Clinic Note    This patient was referred by Jonette Mate, MD  [address]  [place],  [place] [address]    Chief Complaint:  Tiffany Chen is a 76 year old female who is in the clinic today with a chief complaint of follow up.    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     PMH of bronchiectasis, cavitary MAC lung infection, ruptured diverticulitis S/P colostomy in 2009, diabetes, ICH S/P embolization.     MAC history- treatment courses were all azithromycin 566m, Rifampin 6068m, ethambutol 160069mhree times weekly:   - 2018- 01/19/2018  - 05/25/2018- 10/22/2019  - 12/22/2019- 02/16/2020.     She was being treated by Dr. ChoCarola Frostr MAC lung infection. She reports that daily dosing of medication and adding a 4th antibiotic were not discussed before 2022, despite her disease being cavitary.     In late 2021, CT scan showed enlarging cavity. December 2021 a bronch was performed but no MAC growth. Her doctor restarted azithro/Rifampin/ethmabutol based on imaging.   Feb 2022 she saw [name] stopped the antibiotics based on the lack of microbiologic data. Performed repeat bronch 04/28/2020 which has smear AFB 3+ and culture MAC (drug susc pending)     Over the last several months her symptoms have consisted of significant daily coughing, causing night time awakening most nights, night sweats every night, and weight loss >30lbs. Also significant fatigue. Dr. AshCecile Hearingescribed nebulized saline but she did not start it.   She tried to walk several miles each day despite her fatigue.     Significant heartburn if she lays shortly after eating.     ROS as above otherwise negative.     Miscellaneous:   Never smoker.   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   Her husband died after contracting COVID in the hospital.   [place]   Her brother is Vegan.   No hot tubs,  no swimming pools.     Last visit 04/18/2021 with me and 06/19/2021 with NP PenEarlie ServerContinued azithromycin 250m78mily, rifampin 600mg71mly, Ethambutol 800mg 60my. Inhaled Arikayce daily (replaced the amikacin IV in early Nov 2022). Added clofazimine 100mg d84m on 02/21/2021. S/P lobectomy in 03/20/2021 by CT surgery for removal of the cavitary lesions: (S/P DA VINCI XI-Right Robotic upper lobectomy with middle and lower  lobe wedge resection).     Before the surgery she had had a significant improvement in energy levels and her night sweats had resolved/ Coughing had decreased significantly. She hardly coughs Weight is stable.    Since 4/12 she felt she has regained more energy. She is walking 1-2 hours daily at an OK paceAlansonal cough. No phlegm (which is why she has not submitted samples for testing).     Following with surgery who are planning am ostomy take-down.     Overwhelmed with the heavy burden of her medical care. And her brother is moving away. She will [place] as [place], otherwise negative.     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)    Mycobacterium avium complex (CMS-HCC)    s/p Robotic right upper lobectomy with en bloc lower and middle lobe wedge resection, middle lobe wedge resection    Status post Hartmann procedure (CMS-HCC)    Colostomy in place (CMS-HCC)    Ventral hernia without obstruction or gangrene    Colostomy status (CMS-HCC)     Past Medical History:   Diagnosis Date    Acquired tracheobronchomegaly with bronchiectasis (CMS-HCC) 05/22/2020    Cavitary lesion of lung 07/24/2020    Depression     DM (diabetes mellitus) (CMS-HCC)      Gastroesophageal reflux disease, unspecified whether esophagitis present 10/12/2020    Added automatically from request for surgery [id number]    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     Past Surgical History:   Procedure Laterality Date    BRONCHOSCOPY  12/2020    COLOSTOMY      LUNG LOBECTOMY       Allergies   Allergen Reactions    Black [employer] Other     Current Outpatient Medications on File Prior to Visit   Medication Sig Dispense Refill    Amikacin Sulfate Liposome (ARIKAYCE) 590 MG/8.4ML SUSP 590 mg by Nebulization route daily. 252 mL 11    atorvastatin (LIPITOR) 10 MG tablet Take 1 tablet (10 mg) by mouth daily.      azithromycin (ZITHROMAX) 250 MG tablet TAKE 1 TABLET BY MOUTH  DAILY 60 tablet 11    CLOFAZIMINE IRB 21-0063 50 MG CAPSULE Take 2 capsules (144m) by mouth once daily with food as directed. 200 capsule 0    ethambutol (MYAMBUTOL) 400 MG tablet TAKE 2 TABLETS BY MOUTH  DAILY 120 tablet 11    ferrous sulfate 325 (65 Fe) MG tablet Take 1 tablet (325 mg) by mouth 3 times daily.      fluoxetine (PROZAC) 40 MG capsule Take 1 capsule (40 mg) by mouth daily. 90 capsule 3    metFORMIN (GLUCOPHAGE) 500 mg tablet Take 1 tablet (500 mg) by mouth 2 times daily (with meals).      neomycin 500 MG tablet Take 2 tablets (1000 mg) by mouth at noon and 2 tablets (1000 mg) at 8 PM on the day prior to surgery. 4 tablet 0    omeprazole (PRILOSEC) 20 MG capsule TAKE 1 CAPSULE BY MOUTH IN  THE MORNING BEFORE  BREAKFAST 90 capsule 3     Pediatric Multivitamins-Fl (MULTIVITAMINS/FL PO)       polyethylene glycol (GLYCOLAX) 17 GM/SCOOP powder Mix entire bottle in 2 liters of Gatorade and begin drinking at noon on the day prior to surgery. 238 g 0    ramipril (ALTACE) 2.5 MG capsule Take 1 capsule (2.5 mg) by mouth daily.      rifAMPin (RIFADIN) 300 MG capsule TAKE 2 CAPSULES BY MOUTH  DAILY 120 capsule 11    sodium chloride 7 % NEBU 4 mL by Nebulization route every 12 hours. 240 mL 11  VENTOLIN HFA 108 (90 Base) MCG/ACT inhaler USE 2 INHALATIONS BY MOUTH  EVERY 6 HOURS AS NEEDED FOR WHEEZING 72 g 3     Current Facility-Administered Medications on File Prior to Visit   Medication Dose Route Frequency Provider Last Rate Last Admin    lidocaine (XYLOCAINE) 2 % viscous solution 1 mL  1 mL Other PRN Broderick, Ethelle Lyon, MD        lidocaine (XYLOCAINE) 4 % topical   Other PRN Diona Browner, MD         Family History   Problem Relation Name Age of Onset    Cancer Mother Prescilla Sours ngs and throat    Heart Disease Mother Eliane Decree         Congrestive heart failure due to cancer    Hypertension Mother Eliane Decree     Psychiatry Mother Eliane Decree         Bi polar, manic depressive    Cancer Father Warren Danes         Prostate    Diabetes Father Warren Danes     Stroke Father Warren Danes     Stroke Maternal Grandmother Tilden Dome     Cancer Brother Dian Situ         Skin (many locations)    Diabetes Brother Dian Situ      Social History     Socioeconomic History    Marital status: Widowed   Tobacco Use    Smoking status: Never    Smokeless tobacco: Never   Substance and Sexual Activity    Alcohol use: Not Currently    Drug use: Not Currently    Sexual activity: Not Currently   Other Topics Concern    Military Service No    Blood Transfusions No    Caffeine Concern No    Occupational Exposure No    Hobby Hazards Yes     Comment: Gardening, MAC    Sleep Concern No    Stress Concern Yes     Comment: Husband death Cov19, 2020, sold home,  rehomed pets. [place]    Weight Concern No    Special Diet Yes    Back Care No    Exercises Regularly Yes    Bike Helmet Use No    Seat Belt Use No    Performs Self-Exams No   Social History Narrative    Lives with brother Jeneen Rinks at home. Sister also lives in [place] [facility] of Health      Exercise is Medicine (EIM)       Examination:  Vitals:    08/15/21 0857   BP: (!) 123/50   BP Location: Left arm   BP Patient Position: Sitting   BP cuff size: Regular   Pulse: 69   Resp: 16   Temp: 98 F (36.7 C)   TempSrc: Temporal   SpO2: 98%   Weight: 62.4 kg (137 lb 8 oz)   Height: 5' 2.5" (1.588 m)     General: The patient is well appearing, sitting comfortably on the examination table with no evidence of respiratory distress.  Heart: regular rate and rhythm, no murmurs rubs or gallops, normally split S2, no accentuation of P2  Lungs: clear to ausculation and percussion, no wheezes, normal I:E ratio.   Skin: puncture sites in right chest healed well without erythema or swelling or tenderness.     Laboratory Review:  Lab Results   Component Value Date    WBC 4.4 07/24/2021    HGB 12.4 07/24/2021    HCT 38.0 07/24/2021    PLT 239 07/24/2021     Lab Results   Component Value Date    NA 140 07/24/2021    K 4.6 07/24/2021    CL 104 07/24/2021    BICARB 25 07/24/2021    BUN 14 07/24/2021    CREAT 0.76 07/24/2021    GLU 138 (H) 07/24/2021    [place] 10.0 07/24/2021        Pulmonary Function Test Review:  04/28/2020 (care everywhere): noraml spito, normal volumes, mildly reduced DLCO    Radiology Review:  CT chest 04/13/2020:   CONCLUSION:   1. 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   2. Cavitary lesion in the periphery of the right lung has increased in size measuring 2.7 cm, although may have a slightly less thickened and nodular wall.   3.  Diffuse bronchiectasis most pronounced in the right upper lobe.     CT chest 06/22/2021:  IMPRESSION:  1. Post interval right upper lobectomy with a small residual right hydropneumothorax.      2. Scattered tree-in-bud airspace opacities throughout the lungs, similar to slightly improved.  Previously seen patchy groundglass opacities within the lungs appear improved.    3. Mediastinal lymphadenopathy.  This appears slightly increased in comparison to the prior study.    Microbiology  BAL 04/28/2020 (from Dr. Cecile Hearing): AFB smear 3+, culture MAC (drug susceptibly testing sent to [facility]- pending)    Sputum - AFB  08/01/20: AFB smear negative, Cx MAC  08/03/20: AFB smear 1+, Cx MAC (MIS clarithro 0.5, amikacin 4)  08/07/20: AFB smear negative, Cx MAC  10/25: AFB smear 1+, Cx MAC  11/1, 11/09/2020: AFB smear negative, Cx MAC  12/25/2020: AFB smear 2+, Culture MAC  01/30/2021: AFB smear and culture negative  02/06/2021: AFB smear negative, culture heavy MAC  02/12/2021: AFB smear and culture negative  02/15/2021: AFB smear negative, culture moderate MAC    Sputum - Routine   08/01/20: NL Resp Flora   08/03/20: NL Resp Flora  08/07/20: NL Resp Flora    EKG   08/23/2020: sinus rhythm with PAC     Impression/Plan:    Problem List:   - cavitary MAC lung infection. 08/14/2020 started azithro/rifampin/ethambutol PLUS Intravenous amikacin. Amikacin IV replaced by inhaled Arikayce early Novermber 2022. Clofazimine added 02/21/2021  - S/P DA VINCI XI-Right Robotic upper lobectomy with middle and lower  lobe wedge resection on 03/20/2021.   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 Amikacin IV replaced by inhaled Arikayce early Novermber 2022. Clofazimine added 02/21/2021  - 3 sputum inductions scheduled in August. If cultures negative, will plan on stopping medications March/April 2024.   - will  plan repeat CT scan close to end of treatment  - referred to pulmonary rehab to assist with recovery from lobectomy. She attended a few sessions then canceled. Preferred to walk daily outdoors.   - EGD with [facility] 02/01/2021 showed significant reflux. Referred to Gen surgery for consideration of surgical intervention to decreae risk of recurrent lung infections. May not proceed with Nissen's.   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 Encouraged exercise.       Patient Instructions   -continue to take the MAC Antibiotics (azithromycin, rifampin, ethambutol, inhaled arikayce and clofazimine)  -continue the nebulized saline to clean your lungs  -keep up the physical activity-this is a natural way to clean your lungs  - please schedule lung function testing: [facility]:   [place]:  [phone]    Continue the monitoring and testing while on the MAC antibiotics  -blood labs monthly  -sputums 3 every 3-4 months if done by induced sputum. Please schedule 3 sputum inductions in December. Call ASAP to have them scheduled.   -EKG-every 3-4 months  -Audiogram every 3-4 months  -[place] [facility] Color Discrimination Testing each eye at home twice a week  -follow up with Ledon NP in 2 months and with me in 4 months    Total time spent 66mn:   - with patient, examining, history taking, planning management, counseling  - reviewing images  - records review  - interpretation of results  - documentation    Orders:  No orders of the defined types were placed in this encounter.    No orders of the defined types were placed in this encounter.    Orders Placed This Encounter   Procedures    Induced Sputum Lab Service Request

## 2021-08-15 NOTE — Patient Instructions (Addendum)
-  continue to take the MAC Antibiotics (azithromycin, rifampin, ethambutol, inhaled arikayce and clofazimine)  -continue the nebulized saline to clean your lungs  -keep up the physical activity-this is a natural way to clean your lungs  - please schedule lung function testing: [facility]:   [place]:  [phone]    Continue the monitoring and testing while on the MAC antibiotics  -blood labs monthly  -sputums 3 every 3-4 months if done by induced sputum. Please schedule 3 sputum inductions in December. Call ASAP to have them scheduled.   -EKG-every 3-4 months  -Audiogram every 3-4 months  -[place] [facility] Color Discrimination Testing each eye at home twice a week  -follow up with Ledon NP in 2 months and with me in 4 months

## 2021-08-16 ENCOUNTER — Other Ambulatory Visit: Payer: Self-pay

## 2021-08-16 MED ORDER — CLOFAZIMINE IRB 21-0063 50 MG CAPSULE
0 refills | Status: DC
Start: 2021-08-16 — End: 2021-11-23
  Filled 2021-08-16: qty 200, 100d supply, fill #0

## 2021-08-16 NOTE — Telephone Encounter (Signed)
Resolved. Refaxed refill request

## 2021-08-17 ENCOUNTER — Other Ambulatory Visit: Payer: Self-pay

## 2021-08-17 ENCOUNTER — Ambulatory Visit
Admission: RE | Admit: 2021-08-17 | Discharge: 2021-08-17 | Disposition: A | Discharge status: Home / Self Care | Payer: Medicare Other | Attending: Critical Care Medicine | Admitting: Critical Care Medicine

## 2021-08-17 DIAGNOSIS — A319 Mycobacterial infection, unspecified: Secondary | ICD-10-CM | POA: Insufficient documentation

## 2021-08-17 DIAGNOSIS — R0989 Other specified symptoms and signs involving the circulatory and respiratory systems: Secondary | ICD-10-CM

## 2021-08-17 NOTE — Procedures (Signed)
No note
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
## 2021-08-22 ENCOUNTER — Encounter (INDEPENDENT_AMBULATORY_CARE_PROVIDER_SITE_OTHER): Payer: Self-pay | Admitting: Nurse Practitioner

## 2021-08-22 ENCOUNTER — Telehealth (INDEPENDENT_AMBULATORY_CARE_PROVIDER_SITE_OTHER): Payer: Self-pay | Admitting: Student in an Organized Health Care Education/Training Program

## 2021-08-22 ENCOUNTER — Ambulatory Visit (INDEPENDENT_AMBULATORY_CARE_PROVIDER_SITE_OTHER): Payer: Medicare Other | Admitting: Nurse Practitioner

## 2021-08-22 VITALS — BP 114/60 | HR 76 | Ht 62.5 in | Wt 135.9 lb

## 2021-08-22 DIAGNOSIS — F39 Unspecified mood [affective] disorder: Secondary | ICD-10-CM

## 2021-08-22 DIAGNOSIS — F419 Anxiety disorder, unspecified: Secondary | ICD-10-CM

## 2021-08-22 NOTE — Telephone Encounter (Signed)
Therapy referral from NP Particia Lather.    Called 2x, left VM and sent mychart message to schedule appt with Gretta Arab, LCSW.      If patient calls back OK to schedule in a 62mn Ret Therapy/Ret Therapy Video with HGretta Arab

## 2021-08-26 ENCOUNTER — Encounter (INDEPENDENT_AMBULATORY_CARE_PROVIDER_SITE_OTHER): Payer: Self-pay | Admitting: Nurse Practitioner

## 2021-08-27 ENCOUNTER — Ambulatory Visit
Admission: RE | Admit: 2021-08-27 | Discharge: 2021-08-27 | Disposition: A | Discharge status: Home / Self Care | Payer: Medicare Other | Attending: Allergy | Admitting: Allergy

## 2021-08-27 VITALS — BP 114/54 | HR 98 | Temp 98.0°F | Resp 16 | Ht 62.5 in | Wt 136.9 lb

## 2021-08-27 DIAGNOSIS — D809 Immunodeficiency with predominantly antibody defects, unspecified: Secondary | ICD-10-CM | POA: Insufficient documentation

## 2021-08-27 MED ORDER — DIPHENHYDRAMINE HCL 25 MG OR TABS OR CAPS CUSTOM
25.0000 mg | ORAL_CAPSULE | Freq: Once | ORAL | Status: DC | PRN
Start: 2021-08-27 — End: 2021-08-31

## 2021-08-27 MED ORDER — IMMUNE GLOBULIN (HUMAN) 20 GM/200ML IJ SOLN
0.5000 g/kg | Freq: Once | INTRAMUSCULAR | Status: AC
Start: 2021-08-27 — End: 2021-08-27
  Administered 2021-08-27: 25 g via INTRAVENOUS
  Filled 2021-08-27: qty 50

## 2021-08-27 MED ORDER — ACETAMINOPHEN 325 MG PO TABS
650.0000 mg | ORAL_TABLET | Freq: Once | ORAL | Status: AC
Start: 2021-08-27 — End: 2021-08-27
  Administered 2021-08-27: 650 mg via ORAL
  Filled 2021-08-27: qty 2

## 2021-08-27 MED ORDER — CETIRIZINE HCL 10 MG OR TABS
10.0000 mg | ORAL_TABLET | Freq: Once | ORAL | Status: AC
Start: 2021-08-27 — End: 2021-08-27
  Administered 2021-08-27: 10 mg via ORAL
  Filled 2021-08-27: qty 1

## 2021-08-27 MED ORDER — DEXTROSE 5 % IV SOLN
INTRAVENOUS | Status: DC
Start: 2021-08-27 — End: 2021-08-28

## 2021-08-27 NOTE — Interdisciplinary (Signed)
Non-Chemotherapy Infusion Nursing Note - [facility] Tiffany Chen is a 76 year old female who presents for infusion of IVIG 25 g    Vitals:    08/27/21 1357 08/27/21 1639   BP: 137/66 (!) 114/54   BP Location: Left arm    BP Patient Position: Sitting    Pulse: 98    Resp: 16    Temp: 98 F (36.7 C)    TempSrc: Temporal    SpO2: 99%    Weight: 62.1 kg (136 lb 14.5 oz)    Height: 5' 2.5" (1.588 m)      Pain Score: NA (pre med, non-pain or scheduled)  Body surface area is 1.65 meters squared.  Body mass index is 24.64 kg/m.    Medications   dextrose 5% infusion (0 mL IntraVENOUS Stopped 08/27/21 1632)   diphenhydrAMINE (BENADRYL) tablet or capsule 25 mg (has no administration in time range)   acetaminophen (TYLENOL) tablet 650 mg (650 mg Oral Given 08/27/21 1407)   cetirizine (ZYRTEC) tablet 10 mg (10 mg Oral Given 08/27/21 1408)   immune globulin (human) (IGG) 25 g in 250 mL (GAMUNEX-C) infusion (0 g IntraVENOUS Completed 08/27/21 1630)       Pre-treatment nursing assessment:  No problems identified upon assessment.    Tiffany Chen tolerated treatment well.    Post blood return: Brisk  Post-Flush: NS and Discontinued IV    Patient Education  Learner: Patient  Barriers to learning: No Barriers  Readiness to learn: Acceptance  Method: Explanation    Treatment Education: Information/teaching given to patient including: signs and symptoms of infection, bleeding, adverse reaction(s), symptom control, and when to notify MD.    Tiffany Chen Prevention Education: Instructed patient to call for assistance.    Pain Education: Patient instructed to contact nurse if pain should develop or if their current pain therapy becomes ineffective.    Response: Verbalizes understanding    Discharge Plan  Discharge instructions given to patient.  Requested-Future appointments given and reviewed with treatment plan.  Discharge Mode: Ambulatory  Discharge Time: 1635  Accompanied by: Self  Discharged To: Home

## 2021-08-28 ENCOUNTER — Ambulatory Visit
Admission: RE | Admit: 2021-08-28 | Discharge: 2021-08-28 | Disposition: A | Discharge status: Home / Self Care | Payer: Medicare Other | Attending: Nurse Practitioner | Admitting: Nurse Practitioner

## 2021-08-28 DIAGNOSIS — J479 Bronchiectasis, uncomplicated: Secondary | ICD-10-CM | POA: Insufficient documentation

## 2021-08-28 NOTE — Interdisciplinary (Addendum)
PFT Lab note:    Sputum induction performed via handheld nebulizer with 10% NaCl.  Specimen obtained and delivered to laboratory for testing.  Patient tolerated procedure well. No adverse reaction noted.      Cornelia Copa, RCP, RRT  Pulmonary Function and Exercise Lab

## 2021-08-31 ENCOUNTER — Ambulatory Visit
Admission: RE | Admit: 2021-08-31 | Discharge: 2021-08-31 | Disposition: A | Discharge status: Home / Self Care | Payer: Medicare Other | Attending: Nurse Practitioner | Admitting: Nurse Practitioner

## 2021-08-31 DIAGNOSIS — J479 Bronchiectasis, uncomplicated: Secondary | ICD-10-CM | POA: Insufficient documentation

## 2021-08-31 LAB — RESPIRATORY CULTURE W/GRAM STAIN: Respiratory Culture Result: NORMAL

## 2021-08-31 NOTE — Interdisciplinary (Signed)
Pulmonary Functions: 10% NaCl admin via neb. Specimen obtained and brought down to the lab for analysis. No adverse reactions noted.

## 2021-09-01 LAB — RESPIRATORY CULTURE W/GRAM STAIN: Respiratory Culture Result: NORMAL

## 2021-09-03 ENCOUNTER — Ambulatory Visit
Admission: RE | Admit: 2021-09-03 | Discharge: 2021-09-03 | Disposition: A | Discharge status: Home / Self Care | Payer: Medicare Other | Attending: Nurse Practitioner | Admitting: Nurse Practitioner

## 2021-09-03 DIAGNOSIS — J479 Bronchiectasis, uncomplicated: Secondary | ICD-10-CM | POA: Insufficient documentation

## 2021-09-03 LAB — RESPIRATORY CULTURE W/GRAM STAIN

## 2021-09-03 NOTE — Interdisciplinary (Signed)
Pulmonary Functions: 10% NaCl admin via neb. Specimen obtained and brought down to the lab for analysis. No adverse reactions noted.     Desma Mcgregor RRT, RCP  Pulmonary Functions Lab

## 2021-09-04 ENCOUNTER — Telehealth (INDEPENDENT_AMBULATORY_CARE_PROVIDER_SITE_OTHER): Payer: Self-pay | Admitting: Student in an Organized Health Care Education/Training Program

## 2021-09-04 ENCOUNTER — Ambulatory Visit (INDEPENDENT_AMBULATORY_CARE_PROVIDER_SITE_OTHER): Payer: Medicare Other | Admitting: Audiology

## 2021-09-04 ENCOUNTER — Telehealth (HOSPITAL_BASED_OUTPATIENT_CLINIC_OR_DEPARTMENT_OTHER): Payer: Self-pay | Admitting: Nurse Practitioner

## 2021-09-04 NOTE — Telephone Encounter (Signed)
MACRN: this was an induced sputum sample.

## 2021-09-04 NOTE — Telephone Encounter (Signed)
HI Tiffany Chen,     I received an epic message from the lab:    Hi Dr. Janine Ores we received a sputum sample on PT. Tiffany Chen, Econom[employer] ordered for respiratory cx and AFB but the sample cup was not properly closed the sample spilled inside the bag. can not use the sample will need to Re-collect sample. You can call Microbiology Lab if you have any questions at [phone].     Please call patient and inform her she will need to re-submit the sputum sample.     Thank you.

## 2021-09-04 NOTE — Telephone Encounter (Signed)
Clinic is cancelling pt's [facility] therapy intake that was scheduled for Tuesday 09/25/21 @ 10am with Denzil Hughes. Dahlia Client had change in her schedule and will not be available to see therapy intakes until late October/early November).    09/04/21: left pt msg informing that 09/25/21 appt has been cancelled and to call back to r/s with Denzil Hughes in a 1hr NEW THERAPY/NEW THERAPY Video slot

## 2021-09-05 NOTE — Telephone Encounter (Signed)
Fyi  Patient called very upset because her appointment was canceled. Patient stated she has been trying to see someone since April and that this department is worthless and she doesn't want to have anything to do with this department, and that it is is so unprofessional to cancel appointments.  Patient expressed that "no wonder so many people have mental health problems, it's because of you"   Agent let patient know she could express her thoughts and feelings to [facility] and she asked to be transferred there.  Patient was transferred to [facility]/

## 2021-09-06 ENCOUNTER — Telehealth (HOSPITAL_BASED_OUTPATIENT_CLINIC_OR_DEPARTMENT_OTHER): Payer: Self-pay | Admitting: Critical Care Medicine

## 2021-09-06 NOTE — Telephone Encounter (Signed)
Audiogram 8/290/2023:     "Testing 09/04/2021 did reveal barely statistically significant (15 dB) diminished thresholds, at one frequency left and at two frequencies right - that small of a change supports the patient's perception of hearing stability"

## 2021-09-06 NOTE — Progress Notes (Signed)
AUDIOMETRIC ASSESSMENT    History:  Tiffany Chen, a 76 year old female, was referred by Dr. Fransico Michael, Ezzard Standing due to patient treatment with potential ototoxic medication. Patient noted tinnitus bilaterally that she experienced prior to treatment following a fall with blow to head in 2021.  She denied imbalance.   Patient's most recent audiogram was performed on 03/09/2021 at [facility].    Otoscopy:  Otoscopy revealed clear external auditory canals and good tympanic membrane visualization bilaterally.     Subjective Results:  Pulsed tone testing utilizing insert ear phones indicated bilateral thresholds WNLs [phone] Hz, with sloping mild to moderate sensorineural loss thresholds at 1500 Hz and above.   Speech audiometry revealed excellent word recognition of phonetically balanced English words (100%) in the right ear and very good word recognition (96%) in the left ear. Speech reception thresholds (SRTs) were consistent with pure tone averages (PTAs), indicating good reliability.    Impression:  Comparison with test results obtained at this clinic 03/09/2021 indicated stable air conduction thresholds bilaterally across the frequency range (i.e. no air conduction threshold change greater than 10 dB) with the exception of  diminished thresholds (15 dB) left ear at 3000 Hz, and right ear at 6000 and 8000 Hz. Speech audiometry revealed stable word recognition of phonetically balanced English word ability bilaterally.    Test results were discussed with the patient, and patient questions were answered.    Plan:  * Patient scheduled ear audiograms every 3 months per requesting physician's audiometric monitoring protocol. She will contact referring physician and Audiology department ASAP should she notice markedly diminished acuity or significant change in tinnitus she currently experiences.         Audiogram viewable in EPIC "Media" tab.    Jacklyn Shell  Senior Audiologist  [facility]  Otolaryngology /  Head & Neck Surgery and Audiology

## 2021-09-09 NOTE — Progress Notes (Unsigned)
[facility]  [email or web address]        Esophageal Clinic Consultation:  Consulting Physician: Tiffany Barman MD MPH    Date of Service: 09/11/21     Requesting Physician: Tiffany Browner, MD  [address]  [place],  [place] [address]     Reason For Consultation: GERD     Assessment and Plan:  Tiffany Chen is a 76 year old female history of open Hartman procedure for perforated diverticulitis in 2009.  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 Berniece Chen in March 2023.  Patient recovered well.  Currently being evaluated by Dr. Hillery Chen for hernia as possible cause of chronic reflux with micro aspirations. Also with GI for input in the setting of recent pathologic GERD noted on BRAVO 02/01/2021 with AET 13.4%.    BRAVO 02/01/2021 showed total AET 13.4%, supine predominant. She denies heartburn and regurgitation currently while taking PPI daily (omeprazole 39m daily). Prior to PPI she experienced frequent heartburn and regurgitation.     At this time she is fairly asymptomatic from a GI perspective. Minimal cough. No dysphagia, odynophagia, throat clearing, voice changes.      Given her chronic lung infections, over the past several years, it is very possible that these infections (MAC) could be related to reflux. It would be reasonable to discuss anti-reflux measures to preserve her lung function and limit the risk for further aspiration.     [place]      # Pathologic GERD (NERD)  # Chronic lung infection with MAC  # s/p lobectomy   - continue PPI daily   [facility]       Risks and benefits of ongoing PPI therapy according to current body of literature reviewed.         Return to clinic: 3 months         I personally spent a total of 45 minutes in chart review, direct patient care/counseling, documentation and care coordination.      ---------------------------------------------------------------------------------------------------  Chief Complaint: "lungs are infected"    History of Present Illness:   Tiffany Deschampsis a [age]year old who presents for GERD  First visit to Esophageal Clinic: 09/11/2021  76year old female history of open Hartman procedure for perforated diverticulitis in 2009.  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 OBerniece Andreasin March 2023.  Patient recovered well.  Currently being evaluated by Dr. BHillery Hunterfor hernia as possible cause of chronic reflux with micro aspirations. Also with GI for input in the setting of recent pathologic GERD noted on BRAVO 02/01/2021 with AET 13.4%.    BRAVO 02/01/2021 showed total AET 13.4%, supine predominant. She denies heartburn and regurgitation currently while taking PPI daily (omeprazole 271mdaily). Prior to PPI she experienced frequent heartburn and regurgitation.      Given her chronic lung infections, over the past several years, it is very possible that these infections (MAC) could be related to reflux. It would be reasonable to discuss anti-reflux measures to preserve her lung function and limit  the risk for further aspiration.     Overall feels lungs are improving and would like preserve lung function as much as possible.     -Primary Symptom: shortness of breath  -Heartburn: No  -Regurgitation: No  -Voice Hoarseness: No  -Throat Clearing: No  -Dysphagia: No  -Cough: Yes  -Globus: No  -Chest Pain: No  -Sore Throat: No  -Asthma: No      Current PPI:      Prior Esophageal Physiologic Testing Reviewed:   Esophageal Manometry:  Barium Esophagram:   Reflux  Monitoring:  EndoFLIP:   Laryngoscopy:  FEES:    Current Problem List:   Patient Active Problem List    Diagnosis Date Noted    Ventral hernia without obstruction or gangrene 07/24/2021    Colostomy status (CMS-HCC) 07/24/2021    Status post Hartmann procedure (CMS-HCC) 06/26/2021    Colostomy in place (CMS-HCC) 06/26/2021    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    s/p Robotic right upper lobectomy with en bloc lower and middle lobe wedge resection, middle lobe wedge resection 03/20/2021     03/20/21  Robotic right upper lobectomy with en bloc lower and middle lobe wedge resection, middle lobe wedge resection ([facility])      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    Gastroesophageal reflux disease, unspecified whether esophagitis present 10/12/2020     Added automatically from request for surgery [id number]      Hoarse voice quality 10/12/2020     Added automatically from request for surgery [id number]      Cavitary lesion of lung 07/24/2020    Acquired tracheobronchomegaly with bronchiectasis (CMS-HCC) 05/22/2020    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       Past Medical History:  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)     s/p Robotic right upper lobectomy with en bloc lower and middle lobe wedge resection, middle lobe wedge resection 03/20/2021    03/20/21 Robotic right upper lobectomy with en  bloc lower and middle lobe wedge resection, middle lobe wedge resection (Onaitis)       Past Surgical History:  Past Surgical History:   Procedure Laterality Date    BRONCHOSCOPY  12/2020    COLOSTOMY      LUNG LOBECTOMY         Social History:  Social History     Socioeconomic History    Marital status: Widowed   Tobacco Use    Smoking status: Never    Smokeless tobacco: Never   Substance and Sexual Activity    Alcohol use: Not Currently    Drug use: Not Currently    Sexual activity: Not Currently   Social Activities of Daily Living Present    Military Service No    Blood Transfusions No    Caffeine Concern No    Occupational Exposure No    Hobby Hazards Yes     Comment: Gardening, MAC    Sleep Concern No    Stress Concern Yes       Comment: Husband death Cov19, 2020, sold home, rehomed pets. [place]    Weight Concern No    Special Diet Yes    Back Care No    Exercises Regularly Yes    Bike Helmet Use No    Seat Belt Use No    Performs Self-Exams No   Social History Narrative    Lives with brother Jeneen Rinks at home. Sister also lives in [place]        Family History:  Family History   Problem Relation Name Age of Onset    Cancer Mother Prescilla Sours ngs and throat    Heart Disease Mother Eliane Decree         Congrestive heart failure due to cancer    Hypertension Mother Eliane Decree     Psychiatry Mother Eliane Decree         Bi polar, manic depressive    Cancer Father Warren Danes         Prostate    Diabetes Father Warren Danes     Stroke Father Warren Danes     Stroke Maternal Grandmother Tilden Dome     Cancer Brother Dian Situ         Skin (many locations)    Diabetes Brother Dian Situ        Allergy:  Allergies   Allergen Reactions    [facility] Flavor Other       Medications:    Current Outpatient Medications:     Amikacin Sulfate Liposome (ARIKAYCE) 590 MG/8.4ML SUSP, 590 mg by Nebulization route daily., Disp: 252 mL, Rfl: 11    atorvastatin (LIPITOR) 10 MG tablet, Take 1 tablet (10 mg) by mouth daily., Disp: ,  Rfl:     azithromycin (ZITHROMAX) 250 MG tablet, TAKE 1 TABLET BY MOUTH  DAILY, Disp: 60 tablet, Rfl: 11    CLOFAZIMINE IRB 21-0063 50 MG CAPSULE, Take 2 capsules (193m) by mouth once daily with food as directed., Disp: 200 capsule, Rfl: 0    CLOFAZIMINE IRB 21-0063 50 MG CAPSULE, Take 2 capsules (1032m by mouth once daily with food as directed., Disp: 200 capsule, Rfl: 0    ethambutol (MYAMBUTOL) 400 MG tablet, TAKE 2 TABLETS BY MOUTH  DAILY, Disp: 120 tablet, Rfl: 11    ferrous sulfate 325 (65 Fe) MG tablet, Take 1 tablet (325 mg) by mouth 3 times daily., Disp: , Rfl:     fluoxetine (PROZAC) 40 MG capsule, Take 1 capsule (40 mg) by mouth daily., Disp: 90 capsule, Rfl: 3    metFORMIN (GLUCOPHAGE) 500 mg tablet, Take 1 tablet (500 mg) by mouth 2 times daily (with meals)., Disp: , Rfl:     neomycin 500 MG tablet, Take 2 tablets (1000 mg) by mouth at noon and 2 tablets (1000 mg) at 8 PM on the day prior to surgery., Disp: 4 tablet, Rfl: 0    omeprazole (PRILOSEC) 20 MG capsule, TAKE 1 CAPSULE BY MOUTH IN  THE MORNING BEFORE  BREAKFAST, Disp: 90 capsule, Rfl: 3    Pediatric Multivitamins-Fl (MULTIVITAMINS/FL PO), , Disp: , Rfl:     polyethylene glycol (GLYCOLAX) 17 GM/SCOOP powder, Mix entire bottle in 2 liters of Gatorade and begin drinking at noon on the day prior to surgery., Disp: 238 g, Rfl: 0    ramipril (ALTACE) 2.5 MG capsule, Take 1 capsule (2.5 mg) by mouth daily., Disp: , Rfl:     rifAMPin (RIFADIN) 300 MG capsule, TAKE  2 CAPSULES BY MOUTH  DAILY, Disp: 120 capsule, Rfl: 11    sodium chloride 7 % NEBU, 4 mL by Nebulization route every 12 hours., Disp: 240 mL, Rfl: 11    VENTOLIN HFA 108 (90 Base) MCG/ACT inhaler, USE 2 INHALATIONS BY MOUTH  EVERY 6 HOURS AS NEEDED FOR WHEEZING, Disp: 72 g, Rfl: 3    Current Facility-Administered Medications:     lidocaine (XYLOCAINE) 2 % viscous solution 1 mL, 1 mL, Other, PRN, Broderick, Ryan Cullen, MD    lidocaine (XYLOCAINE) 4 % topical, , Other, PRN, Broderick, Ryan  Cullen, MD    Review of Systems:  As per HPI    Physician examination:   BP 126/67 (BP Location: Left arm, BP Patient Position: Sitting, BP cuff size: Regular)   Pulse 76   Temp 96.9 F (36.1 C) (Temporal)   Resp 15   Ht 5' 2.5" (1.588 m)   Wt 61.7 kg (136 lb)   SpO2 97%   BMI 24.48 kg/m  Body mass index is 24.48 kg/m.  Wt Readings from Last 2 Encounters:   09/11/21 61.7 kg (136 lb)   08/27/21 62.1 kg (136 lb 14.5 oz)      Blood Pressure   09/11/21 126/67   08/27/21 (!) 114/54     Temperature:  [96.9 F (36.1 C)] 96.9 F (36.1 C) (09/05 1009)  Blood pressure (BP): (126)/(67) 126/67 (09/05 1009)  Heart Rate:  [76] 76 (09/05 1009)  Respirations:  [15] 15 (09/05 1009)  Pain Score: 0 (09/05 1009)  SpO2:  [97 %] 97 % (09/05 1009)     Physical Exam  Constitutional:       Appearance: Normal appearance.   HENT:      Head: Normocephalic and atraumatic.      Nose: Nose normal. No congestion.      Mouth/Throat:      Mouth: Mucous membranes are moist.      Pharynx: No oropharyngeal exudate.   Eyes:      General: No scleral icterus.     Extraocular Movements: Extraocular movements intact.      Pupils: Pupils are equal, round, and reactive to light.   Pulmonary:      Effort: Pulmonary effort is normal. No respiratory distress.      Breath sounds: No wheezing.   Abdominal:      General: Abdomen is flat. There is no distension.      Tenderness: There is no abdominal tenderness.   Musculoskeletal:         General: No deformity. Normal range of motion.   Skin:     General: Skin is warm.      Coloration: Skin is not jaundiced.   Neurological:      General: No focal deficit present.      Mental Status: She is alert.   Psychiatric:         Mood and Affect: Mood normal.         Behavior: Behavior normal.         Thought Content: Thought content normal.          Labs: personally reviewed and notable for   CBC  Lab Results   Component Value Date    WBC 4.4 07/24/2021    HGB 12.4 07/24/2021    HCT 38.0 07/24/2021    MCV 89.2  07/24/2021    PLT 239 07/24/2021     Chemistry  Lab Results   Component Value Date    NA 140   07/24/2021    K 4.6 07/24/2021    CL 104 07/24/2021    BICARB 25 07/24/2021    BUN 14 07/24/2021    CREAT 0.76 07/24/2021    GLU 138 (H) 07/24/2021     Liver  Lab Results   Component Value Date    TBILI 0.30 07/24/2021    DBILI <0.2 07/17/2020    AST 30 07/24/2021    ALT 26 07/24/2021    ALK 55 07/24/2021    ALB 4.0 07/24/2021     CoagsNo results found for: INR, PTT    Images and studies: personally reviewed and notable for   HRM 06/2021:  -no manometric hiatal hernia  -Overall the study supports normal LES relaxation given normal IRP in the upright position, relatively preserved bolus clearance and lack of intrabolus pressurization. The elevated IRP in the supine position is likely artefactual. However if there is concern about an outflow obstruction further evaluation with barium esophagram with tablet and/or FLIP would be recommended.   -Intact esophageal peristalsis.   There is no disorder of EGJ outflow or esophageal peristalsis per [place] Classification version 4.0.     EGD 01/2021:  FINDINGS  GIFHQ190 scope with cap used. Normal appearing entire esophagus. GE junction 37cm from incisors. Mild gastropathy characterized by punctate heme, biopsied with cold forceps. Mild duodenal erosions in the bulb with reactive appearance. Otherwise normal   appearing duodenum. Scope withdrawn and wireless pH Bravo advanced in to the esophagus and deployed in standard fashion 31cm from the incisors. Scope reintroduced and bravo device was in excellent position.     ENDOSCOPIC DIAGNOSIS  1) Mild gastropathy and duodenopathy with reactive appearance. Gastric biopsies obtained with cold forceps.  2) Successful placement of pH Bravo device as described    BRAVO 01/2021:  Total distal esophageal acid exposure time OFF PPI: Severely elevated (13.4%); supine predominant   Insufficient symptoms reported to assess for symptom reflux association    Daily distal esophageal acid exposure: Elevated across all four days of monitoring   Prolonged wireless pH monitoring OFF PPI meeting criteria for pathologic acidic GERD          Electronically Signed by:     Eric Low MD MPH  [place]  [facility]  Division of Gastroenterology

## 2021-09-11 ENCOUNTER — Ambulatory Visit
Payer: Medicare Other | Attending: General Surgery | Admitting: Student in an Organized Health Care Education/Training Program

## 2021-09-11 ENCOUNTER — Encounter (HOSPITAL_BASED_OUTPATIENT_CLINIC_OR_DEPARTMENT_OTHER): Payer: Self-pay | Admitting: Student in an Organized Health Care Education/Training Program

## 2021-09-11 VITALS — BP 126/67 | HR 76 | Temp 96.9°F | Resp 15 | Ht 62.5 in | Wt 136.0 lb

## 2021-09-11 DIAGNOSIS — K219 Gastro-esophageal reflux disease without esophagitis: Secondary | ICD-10-CM | POA: Insufficient documentation

## 2021-09-11 NOTE — Patient Instructions (Addendum)
After Visit Summary:  It was wonderful to meet you today in clinic. As we discussed you have non-erosive reflux disease (NERD). I would recommend that you continue taking omeprozole once pre day. We will discuss your case further at our multidisciplinary conference to discuss the next best steps in your care.     Follow up with me as needed. Otherwise we will plan follow up after discussion at the conference.

## 2021-09-19 ENCOUNTER — Telehealth (HOSPITAL_BASED_OUTPATIENT_CLINIC_OR_DEPARTMENT_OTHER): Payer: Self-pay

## 2021-09-19 DIAGNOSIS — D809 Immunodeficiency with predominantly antibody defects, unspecified: Secondary | ICD-10-CM

## 2021-09-19 NOTE — Telephone Encounter (Signed)
Dr. Selena Batten,     Tiffany Chen is scheduled for infusion of IVIG on 09/24/2021.     Orders are currently unsigned; signed orders are required to avoid cancellation of appointment.   If orders remain unsigned, the patient will be contacted 24 hours prior to their scheduled appointment notifying them of cancellation. The infusion appointment will then be rescheduled after orders are signed.     Patient has a scheduled clinic visit on none.    Thank you,     Otilio Connors, RN

## 2021-09-19 NOTE — Telephone Encounter (Signed)
Orders signed 09/19/21

## 2021-09-20 ENCOUNTER — Encounter (INDEPENDENT_AMBULATORY_CARE_PROVIDER_SITE_OTHER): Payer: Self-pay

## 2021-09-21 ENCOUNTER — Encounter (INDEPENDENT_AMBULATORY_CARE_PROVIDER_SITE_OTHER): Payer: Self-pay | Admitting: General Surgery

## 2021-09-21 DIAGNOSIS — K435 Parastomal hernia without obstruction or  gangrene: Secondary | ICD-10-CM

## 2021-09-21 NOTE — Progress Notes (Addendum)
GEMS 09/20/21:    75 year old female w/hx of open Hartman procedure for perforated diverticulitis in 2009. 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 Berniece Andreas in March 2023.  Patient recovered well.  Currently being evaluated by Dr. Hillery Hunter for hernia as possible cause of chronic reflux with micro aspirations. Also with GI for input in the setting of recent pathologic GERD noted on BRAVO 02/01/2021 with AET 13.4%.       Question:   Anti-reflux surgery to limit risk of aspiration to lungs?      Plan:  -SLP evaluation if not already done. Talk with pulmonary. Likely fundoplication.       ADDENDUM 09/21/21:  Pt was contacted and scheduled for f/u with Dr.Broderick to discuss GEMS recommendations and surgical planning. Pt prefers [facility] location. Appt scheduled at MON. Pt aware of clinic location.

## 2021-09-24 ENCOUNTER — Ambulatory Visit
Admission: RE | Admit: 2021-09-24 | Discharge: 2021-09-24 | Disposition: A | Discharge status: Home / Self Care | Payer: Medicare Other | Attending: Allergy | Admitting: Allergy

## 2021-09-24 VITALS — BP 114/68 | HR 61 | Temp 97.8°F | Resp 16 | Ht 62.5 in | Wt 138.9 lb

## 2021-09-24 DIAGNOSIS — D809 Immunodeficiency with predominantly antibody defects, unspecified: Secondary | ICD-10-CM | POA: Insufficient documentation

## 2021-09-24 LAB — IGG, BLOOD: IGG: 1020 mg/dL (ref 700–1600)

## 2021-09-24 MED ORDER — CETIRIZINE HCL 10 MG OR TABS
10.0000 mg | ORAL_TABLET | Freq: Once | ORAL | Status: AC
Start: 2021-09-24 — End: 2021-09-24
  Administered 2021-09-24: 10 mg via ORAL
  Filled 2021-09-24: qty 1

## 2021-09-24 MED ORDER — DIPHENHYDRAMINE HCL 25 MG OR TABS OR CAPS CUSTOM
25.0000 mg | ORAL_CAPSULE | Freq: Once | ORAL | Status: DC | PRN
Start: 2021-09-24 — End: 2021-09-28

## 2021-09-24 MED ORDER — ACETAMINOPHEN 325 MG PO TABS
650.0000 mg | ORAL_TABLET | Freq: Once | ORAL | Status: AC
Start: 2021-09-24 — End: 2021-09-24
  Administered 2021-09-24: 650 mg via ORAL
  Filled 2021-09-24: qty 2

## 2021-09-24 MED ORDER — DEXTROSE 5 % IV SOLN
INTRAVENOUS | Status: DC
Start: 2021-09-24 — End: 2021-09-25

## 2021-09-24 MED ORDER — IMMUNE GLOBULIN (HUMAN) 20 GM/200ML IJ SOLN
0.5000 g/kg | Freq: Once | INTRAMUSCULAR | Status: AC
Start: 2021-09-24 — End: 2021-09-24
  Administered 2021-09-24: 25 g via INTRAVENOUS
  Filled 2021-09-24: qty 200

## 2021-09-24 NOTE — Interdisciplinary (Signed)
Non-Chemotherapy Infusion Nursing Note - Tiffany Chen is a 76 year old female who presents for infusion of IVIG.    Vitals:    09/24/21 0920 09/24/21 1141   BP: 108/64 114/68   BP Location: Right arm Right arm   BP Patient Position: Sitting Sitting   Pulse: 76 61   Resp: 16 16   Temp: 97.5 F (36.4 C) 97.8 F (36.6 C)   TempSrc: Temporal Temporal   SpO2: 98% 98%   Weight: 63 kg (138 lb 14.2 oz)    Height: 5' 2.5" (1.588 m)      Pain Score: 0  Body surface area is 1.67 meters squared.  Body mass index is 25 kg/m.    Pre-treatment nursing assessment:  No problems identified upon assessment.  Patient arrived ambulatory in stable condition.   Denies pain.   Denies nausea or vomiting.   Denies diarrhea or constipation.   Denies fevers or recent illness.     Medications   dextrose 5% infusion (0 mL IntraVENOUS Stopped 09/24/21 1140)   diphenhydrAMINE (BENADRYL) tablet or capsule 25 mg (25 mg Oral Not given 09/24/21 0901)   acetaminophen (TYLENOL) tablet 650 mg (650 mg Oral Given 09/24/21 0901)   cetirizine (ZYRTEC) tablet 10 mg (10 mg Oral Given 09/24/21 0901)   immune globulin (human) (IGG) 25 g in 250 mL (GAMUNEX-C) infusion (0 g IntraVENOUS Completed 09/24/21 1137)        [name] tolerated treatment well.    Post blood return: Brisk  Post-Flush: NS    Patient Education  Learner: Patient  Barriers to learning: No Barriers  Readiness to learn: Acceptance  Method: Explanation    Treatment Education: Information/teaching given to patient including: signs and symptoms of infection, bleeding, adverse reaction(s), symptom control, and when to notify MD.    [name] Prevention Education: Instructed patient to call for assistance.    Pain Education: Patient instructed to contact nurse if pain should develop or if their current pain therapy becomes ineffective.    Response: Verbalizes understanding    Discharge Plan  Discharge instructions given to patient.  Future appointments given and  reviewed with treatment plan.  Discharge Mode: Ambulatory  Discharge Time: 1142  Accompanied by: Self  Discharged To: Home

## 2021-09-25 ENCOUNTER — Ambulatory Visit (INDEPENDENT_AMBULATORY_CARE_PROVIDER_SITE_OTHER): Payer: Medicare Other | Admitting: Student in an Organized Health Care Education/Training Program

## 2021-09-28 ENCOUNTER — Encounter (HOSPITAL_BASED_OUTPATIENT_CLINIC_OR_DEPARTMENT_OTHER): Payer: Self-pay

## 2021-09-28 ENCOUNTER — Telehealth (HOSPITAL_BASED_OUTPATIENT_CLINIC_OR_DEPARTMENT_OTHER): Payer: Self-pay | Admitting: Critical Care Medicine

## 2021-09-28 DIAGNOSIS — R0602 Shortness of breath: Secondary | ICD-10-CM

## 2021-09-28 DIAGNOSIS — J479 Bronchiectasis, uncomplicated: Secondary | ICD-10-CM

## 2021-09-28 DIAGNOSIS — J984 Other disorders of lung: Secondary | ICD-10-CM

## 2021-09-28 DIAGNOSIS — A310 Pulmonary mycobacterial infection: Secondary | ICD-10-CM

## 2021-09-28 NOTE — Telephone Encounter (Signed)
MACRN: pt called and notified to call to schedule the video swallow study and given phone # to call.     Reviewed plan of care: testing/monitoring while on MAC antibx tx A/E/R/C  -labs-due now  -sputums-induced -new order placed and she will call to schedule for Novemeber  -ekg-due in October-order placed  -audiogram-last 08/29 due December-order placed

## 2021-09-28 NOTE — Addendum Note (Signed)
Addended by: Frutoso Schatz on: 09/28/2021 11:26 AM     Modules accepted: Orders

## 2021-09-28 NOTE — Telephone Encounter (Signed)
76 yr old with bronchiectasis, IgG deficiency, cavitary MAC lung infection S/P S/P DA VINCI XI-Right Robotic upper lobectomy with middle and lower  lobe wedge resection on 03/20/2021.     RN to call patient:   - inform her that Dr. Hillery Hunter (surgeon) and I have discussed her case and her hiatal hernia.   - we would like to evaluate her swallow function and rule out microaspiration. I ordered a video swallow study with speech pathology evaluation. Ask her to schedule.

## 2021-10-01 ENCOUNTER — Other Ambulatory Visit (INDEPENDENT_AMBULATORY_CARE_PROVIDER_SITE_OTHER): Payer: Self-pay | Admitting: Critical Care Medicine

## 2021-10-01 DIAGNOSIS — A310 Pulmonary mycobacterial infection: Secondary | ICD-10-CM

## 2021-10-01 DIAGNOSIS — J984 Other disorders of lung: Secondary | ICD-10-CM

## 2021-10-01 DIAGNOSIS — J479 Bronchiectasis, uncomplicated: Secondary | ICD-10-CM

## 2021-10-01 MED ORDER — SODIUM CHLORIDE (INHALANT) 7 % IN NEBU
INHALATION_SOLUTION | RESPIRATORY_TRACT | 3 refills | Status: AC
Start: 2021-10-01 — End: ?

## 2021-10-02 ENCOUNTER — Other Ambulatory Visit (HOSPITAL_BASED_OUTPATIENT_CLINIC_OR_DEPARTMENT_OTHER): Payer: Self-pay | Admitting: Critical Care Medicine

## 2021-10-02 DIAGNOSIS — J984 Other disorders of lung: Secondary | ICD-10-CM

## 2021-10-02 DIAGNOSIS — A310 Pulmonary mycobacterial infection: Secondary | ICD-10-CM

## 2021-10-02 DIAGNOSIS — A319 Mycobacterial infection, unspecified: Secondary | ICD-10-CM

## 2021-10-02 MED ORDER — ARIKAYCE 590 MG/8.4ML IN SUSP
RESPIRATORY_TRACT | 5 refills | Status: DC
Start: 2021-10-02 — End: 2022-06-13

## 2021-10-03 ENCOUNTER — Encounter (INDEPENDENT_AMBULATORY_CARE_PROVIDER_SITE_OTHER): Payer: Medicare Other | Admitting: Nurse Practitioner

## 2021-10-08 ENCOUNTER — Other Ambulatory Visit (INDEPENDENT_AMBULATORY_CARE_PROVIDER_SITE_OTHER): Payer: Self-pay

## 2021-10-08 DIAGNOSIS — I10 Essential (primary) hypertension: Secondary | ICD-10-CM

## 2021-10-10 NOTE — Progress Notes (Signed)
PULMONARY OUTPATIENT CLINIC FOLLOW-UP NOTE    Chief Complaint   Patient presents with    Follow Up       History of Present Illness:  Tiffany Chen is a 76 year old female with history of bronchiectasis, cavitary MAC lung infection, ruptured diverticulitis s/p colostomy in 2009, DM, ICH s/p embolization. She is here to follow up on bronchiectasis and MAC lung infection.      Patient's last visit on 08/15/2021 with [name] to follow up on bronchiectasis and MAC lung infection. I saw Tiffany Chen the last on 06/19/2021 to follow up on bronchiectasis and MAC lung infection. She states doing well from a respiratory stand point since last visit. She denies ED visits or recent hospitalizations.       Tiffany Chen has history of MAC lung infection treated multiple times with azithromycin 572m, Rifampin 6010m, ethambutol 160042mhree times weekly: 2018- 01/19/2018, 05/25/2018- 10/22/2019 and 12/22/2019- 02/16/2020.     As per last note from Dr. [name] In late 2021, CT scan showed enlarging cavity. December 2021 a bronch was performed but no MAC growth. Her doctor restarted azithro/Rifampin/ethmabutol based on imaging.   Feb 2022 she saw [name] stopped the antibiotics based on the lack of microbiologic data. Performed repeat bronch 04/28/2020 which has smear AFB 3+ and culture MAC (drug susc pending).      07/25/2019 - she was strongly recommended to start new round of MAC antibiotic therapy due to the severity of the lung cavitation and severity of her symptoms. The regimen recommended was IV antibiotic (Amikacin) home infusion for 3 months, azithromycin 250 mg daily, ethambutol 800 mg daily and rifampin 600 mg daily.       08/14/2020 - She started on MAC antibiotic therapy with tentative plan to stop IV amikacin on 11/24/2020 and replaced with arikayce. Her IV was removed on 11/24/2020 and also discussed the possibility of right upper lobe lobectomy.    11/23/2020 - she started on Arikayce nebs  every day. She reports mild sore throat with Arykace but was able to tolerate with hot tea.       01/17/2021 - she was added clofazimine to her regimen.  In addition to azithro/rifampin/ethambutol and Arikayce. She also was referred to Dr. OnaBerniece Andreasr consideration for right upper and middle lobectomy. She was scheduled for RUL lobectomy on 03/20/2021.      02/21/2021 _ She was added clofazimine.      03/20/2021 -  She have had Right Robotic upper lobectomy with middle and lower  lobe wedge resection.      Her current regimen included: azithromycin, rifampin, ethambutol, clofazimine (which was started 02/21/2021) and inhaled Arikayce.     While on therapy she was advised to have lab work twice a week while on IV antibiotic and then after every month when off from IV antibiotic, continue with Audiogram every 3-4 months, submit sputum samples x 3 every month, EKG every 3-4 months and continue with ishihara discrimination color test.     She was started on pulmonary rehab but decided to do it independently/     Tiffany Chen been somehow overwhelm after lobectomy and has forgot to due test required for MAC treatment.     She currently denies any side effects with current MAC antibiotics. She reports tolerating well MAC antibiotic therapy.  Overall, she feels better since started on MAC antibiotics.      She will have cataract surgery on 11/2009 and  2016.      Today, Tiffany Chen states feeling well at her baseline. She reports symptom improvement with current MAC antibiotic therapy. She reports increased cough after her lobectomy surgery, however her cough has been non productive and not been able to provide a sample for culture. Currently denies cough.  She continues taking MAC antibiotics religiously. She has been doing air way clearance once a day with saline solution twice a day. She denies wheezing or dyspnea. She denies hemoptysis, fevers, chills, nausea, vomiting or night sweats.  She feels warm at night. She is  currently capable to walk more several miles without feeling winded. Her appetite had improved and she noticed some weight gain ( 7 pounds since last visit).  She denies feeling winded when going uphill or on stairs.  She denies chest congestion, orthopnea, PND lower extremity edema. She reports history of palpitations. She denies allergic rhinitis, sinus problems or other URI. She has history of GERD controlled with diet and PPI. She have had influenza vaccine on 10/2021 and competed COVID vaccines with 3 booster. .     Lab work from 07/24/2021 within normal limits( negative for thrombocytopenia, kidney or liver toxicity). EKG from 06/21/2021 with sinus rhythm ( due). She have had audiogram with stable hearing on 09/04/2021 and was recommended to switch to audiogram every 3 month.     Respiratory culture and AFB from 08/31/2021 and 08/28/2021 with preliminary negative results. Last positive AFB culture from 02/15/2021.     Review of Systems:   A full  review of systems was performed and the pertinent positives and negatives were mentioned in the HPI, all other review of systems were negative.     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)     s/p Robotic right upper lobectomy with en bloc lower and middle lobe wedge resection, middle lobe wedge resection 03/20/2021    03/20/21 Robotic right upper lobectomy with en bloc lower and middle lobe wedge resection, middle  lobe wedge resection ([facility])       Past Surgical History:   Procedure Laterality Date    BRONCHOSCOPY  12/2020    COLOSTOMY      LUNG LOBECTOMY         Family History   Problem Relation Name Age of Onset    Cancer Mother Prescilla Sours ngs and throat    Heart Disease Mother Eliane Decree         Congrestive heart failure due to cancer    Hypertension Mother Eliane Decree     Psychiatry Mother Eliane Decree         Bi polar, manic depressive    Cancer Father Warren Danes         Prostate    Diabetes Father Warren Danes     Stroke Father Warren Danes     Stroke Maternal [place]     Cancer Brother Dian Situ  Skin (many locations)    Diabetes Brother Dian Situ        Social History     Socioeconomic History    Marital status: Widowed   Tobacco Use    Smoking status: Never    Smokeless tobacco: Never   Substance and Sexual Activity    Alcohol use: Not Currently    Drug use: Not Currently    Sexual activity: Not Currently   Other Topics Concern    Military Service No    Blood Transfusions No    Caffeine Concern No    Occupational Exposure No    Hobby Hazards Yes     Comment: Gardening, MAC    Sleep Concern No    Stress Concern Yes     Comment: Husband death Cov19, 2020, sold home, rehomed pets. [place]    Weight Concern No    Special Diet Yes    Back Care No    Exercises Regularly Yes    Bike Helmet Use No    Seat Belt Use No    Performs Self-Exams No   Social History Narrative    Lives with brother Jeneen Rinks at home. Sister also lives in [place] Determinants of Health      Exercise is Medicine ([facility])       Allergies   Allergen Reactions    [facility] person Other       Current Outpatient Medications on File Prior to Visit   Medication Sig Dispense Refill    ARIKAYCE 590 MG/8.4ML SUSP Inhale the contents of one vial (556m) via Lamira device 1 time daily as directed. 28 each 5    atorvastatin (LIPITOR) 10 MG tablet Take 1 tablet (10 mg) by mouth daily.      azithromycin (ZITHROMAX) 250 MG  tablet TAKE 1 TABLET BY MOUTH  DAILY 60 tablet 11    CLOFAZIMINE IRB 21-0063 50 MG CAPSULE Take 2 capsules (1031m by mouth once daily with food as directed. 200 capsule 0    CLOFAZIMINE IRB 21-0063 50 MG CAPSULE Take 2 capsules (10092mby mouth once daily with food as directed. 200 capsule 0    ethambutol (MYAMBUTOL) 400 MG tablet TAKE 2 TABLETS BY MOUTH  DAILY 120 tablet 11    ferrous sulfate 325 (65 Fe) MG tablet Take 1 tablet (325 mg) by mouth 3 times daily.      fluoxetine (PROZAC) 40 MG capsule Take 1 capsule (40 mg) by mouth daily. 90 capsule 3    metFORMIN (GLUCOPHAGE) 500 mg tablet Take 1 tablet (500 mg) by mouth 2 times daily (with meals).      neomycin 500 MG tablet Take 2 tablets (1000 mg) by mouth at noon and 2 tablets (1000 mg) at 8 PM on the day prior to surgery. 4 tablet 0    omeprazole (PRILOSEC) 20 MG capsule TAKE 1 CAPSULE BY MOUTH IN  THE MORNING BEFORE  BREAKFAST 90 capsule 3    Pediatric Multivitamins-Fl (MULTIVITAMINS/FL PO)       polyethylene glycol (GLYCOLAX) 17 GM/SCOOP powder Mix entire bottle in 2 liters of Gatorade and begin drinking at noon on the day prior to surgery. 238 g 0    ramipril (ALTACE) 2.5 MG capsule Take 1 capsule (2.5 mg) by mouth daily. 90 capsule 3    rifAMPin (RIFADIN) 300 MG capsule TAKE 2 CAPSULES BY MOUTH  DAILY 120 capsule 11    sodium chloride 7 % NEBU USE 1 VIAL VIA NEBULIZER  EVERY 12 HOURS 720 mL 3    VENTOLIN HFA 108 (90 Base) MCG/ACT inhaler USE 2 INHALATIONS BY MOUTH  EVERY 6 HOURS AS NEEDED FOR WHEEZING 72 g 3     Current Facility-Administered Medications on File Prior to Visit   Medication Dose Route Frequency Provider Last Rate Last Admin    lidocaine (XYLOCAINE) 2 % viscous solution 1 mL  1 mL Other PRN Broderick, Ethelle Lyon, MD        lidocaine (XYLOCAINE) 4 % topical   Other PRN Diona Browner, MD           Physical Examination:  BP 124/66 (BP Location: Right arm, BP Patient Position: Sitting, BP cuff size: Regular)   Pulse 75   Temp 97.4 F  (36.3 C) (Temporal)   Resp 18   Wt 64.5 kg (142 lb 1.6 oz)   SpO2 99%   BMI 25.58 kg/m    Oxygen Therapy  SpO2: 99 %       General: well appearing alert and oriented x 3, in nad.  Oropharynx: without any lesions  Heart:  Regular rate and rhythm, normal S1 S2, no murmurs.  Lungs: clear to auscultation, right upper lobe wheezing, rales, rhonchi, no chest deformities noted. Normal I:E ratio  Extremities:  no cyanosis, clubbing, or edema.  Skin:  No rashes or lesions.  Psych: normal affect and mood       I have reviewed the following  laboratory data and other diagnostic studies:   CBC:  Lab Results   Component Value Date    WBC 4.4 07/24/2021    HGB 12.4 07/24/2021    HCT 38.0 07/24/2021    PLT 239 07/24/2021     CHEM:  Lab Results   Component Value Date    NA 140 07/24/2021    K 4.6 07/24/2021    CL 104 07/24/2021    BICARB 25 07/24/2021    BUN 14 07/24/2021    CREAT 0.76 07/24/2021    GLU 138 (H) 07/24/2021    [place] 10.0 07/24/2021     COAG:  No results found for: PT, PTT, INR  LFTs:  Lab Results   Component Value Date    AST 30 07/24/2021    ALT 26 07/24/2021    ALK 55 07/24/2021    TP 7.0 07/24/2021    ALB 4.0 07/24/2021    TBILI 0.30 07/24/2021    DBILI <0.2 07/17/2020          Review of Radiology Studies:    Pulmonary Function Test Review:  04/28/2020 (care everywhere): noraml spito, normal volumes, mildly reduced DLCO     Radiology Review:    Chest x ray (03/20/2021):  FINDINGS:  Lines and Tubes: Large-bore right-sided chest tube seen in place with some subcutaneous emphysema  Mediastinum: The cardiomediastinal silhouette is within normal limits. No lymphadenopathy is appreciated.  Lungs: The lungs are clear. Status post right upper lobe lobectomy with re-expansion of the lung tissue.  Pleura: No pneumothorax or effusion.  Bones and soft tissues: Unchanged    IMPRESSION:  Right-sided large-bore chest tube with no evidence of pneumothorax. Status post right upper lobectomy    Chest x ray  (12/25/2020):  FINDINGS:  No pleural effusion or pneumothorax demonstrated.     Right upper lobe cavitary lesions.     Additional small nodularity better seen on the comparison chest CT.     Unremarkable cardiac silhouette.     Slight curvature of the descending thoracic aorta.  No acute osseous abnormality identified.    IMPRESSION:  No postprocedural complication identified.    CT chest (06/26/2021):  FINDINGS:  Lines and Tubes: None   Thyroid and thoracic inlet: Normal.   Lymph nodes: There is a left lower cervical lymphadenopathy measuring up to 1.1 cm in short axis diameter, previously 9 mm.  There is left upper paratracheal mediastinal lymphadenopathy measuring up to 1.4 cm in short axis diameter, previously 1 cm.  There is periaortic lymphadenopathy measuring up to 1 cm in short axis diameter, previously 9 mm.  There is subaortic lymphadenopathy measuring up to 1.4 cm in short axis diameter, previously 1.1 cm.  A right subcarinal lymph node measures 1.4 cm in short axis diameter, previously 7 mm.  Left subcarinal lymph nodes measure approximately 1.2 cm in short axis diameter, previously 1.1 cm.  Mediastinal lymph nodes are not well assessed in the absence of intravenous contrast.  Cardiovascular: Heart size is normal.  Mild coronary artery calcification is present the thoracic aorta pulmonary artery are normal in caliber.  Pericardium: There is a trace pericardial effusion, similar to the prior.  Esophagus: Normal.  Lung: Patient is status post interval right upper lobectomy.  There are scattered tree-in-bud opacities present within the lungs similar to slightly improved in comparison to the prior study.  Previously seen scattered patchy groundglass opacities appear improved.  Pleura: There is a small residual right hydropneumothorax.  Abdomen: Please see the concurrently performed CT abdomen pelvis report for findings below the diaphragm.  Bone and soft tissue: No acute abnormality.    IMPRESSION:  1.  Post interval right upper lobectomy with a small residual right hydropneumothorax.      2. Scattered tree-in-bud airspace opacities throughout the lungs, similar to slightly improved.  Previously seen patchy groundglass opacities within the lungs appear improved.    3. Mediastinal lymphadenopathy.  This appears slightly increased in comparison to the prior study.     Ct chest (01/19/2021):  FINDINGS:  Lines and Tubes: None     Thyroid and thoracic inlet: Normal.     Lymph nodes: 11 mm AP window lymph node measured 7 mm on the prior study. Pre-vascular 7 mm lymph node measured 4 mm on the prior study. Subcarinal lymphadenopathy may be slightly increased.     Cardiovascular: Atherosclerotic disease.     Pericardium: Small amount of pericardial fluid is unchanged     Esophagus: Normal.     Lung: 3.4 cm cavitary mass in the posterior segment of the right lower lobe measured 3 cm on the prior study. Increasing ground-glass opacity throughout the anterior and apical segments of the right upper lobe. Increasing consolidation at the right apex. Increasing nodularity and bronchial wall thickening in the right upper lobe. Wall thickness of a cavity in the right upper lobe on series 4, image 334 measured 9 mm. This measured 3 mm on the prior study. Increasing ground-glass opacity in the lower lobes. Increasing tree-in-bud nodularity. Previously seen nodularity persists.     Pleura: Normal.     Abdomen: Visualized superior abdomen demonstrates hepatic hypodensity which is unchanged     Bone and soft tissue: No acute abnormality.    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 Additionally, ground-glass opacity and nodularity has increased.    CT chest (11/09/2020):  FINDINGS:  Lines and Tubes: PICC ends in the left brachiocephalic vein     Thyroid and thoracic inlet: Normal.  Lymph nodes: Lymph nodes have  increased in size. 8 mm AP window lymph node measured 4 mm on the prior study. 8 mm lymph node adjacent to the superior vena cava measured 6 mm on the prior study. Other lymph nodes are unchanged.  Cardiovascular: Atherosclerotic disease is relatively mild for the patient's age.  Pericardium: Small pericardial effusion  Esophagus: Normal.     Lung: Comparison to the outside study is limited due to poor spatial resolution on that study given 5 mm slice thickness versus 7.035 mm slice thickness. New thick walled cavitary 2.6 cm right apical nodule on series 2, image 393 adjacent 12 mm thick walled cavitary nodule is new. Worsening tree-in-bud nodularity and bronchiectasis in the right upper lobe. Thick walled cavity in the posterior segment of the right upper lobe measuring 3.1 cm is unchanged in size but demonstrates increased wall thickness. Surrounding tree-in-bud nodularity is increased. Cavitary nodule in the right middle lobe is unchanged. Bronchiectasis may be increased but is again difficult to assess given differences in slice thickness. Worsening tree-in-bud nodularity throughout the right middle and right lower lobes. Worsening nodular consolidation in the medial aspect of the right middle lobe. New areas of nodularity throughout the left lung.  Pleura: Normal.     Abdomen: 1.2 cm hypoattenuating nodule in the liver. This is unchanged     Bone and soft tissue: No acute abnormality.    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    CT chest 04/13/2020:   CONCLUSION:   1. 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   2. Cavitary lesion in the periphery of the right lung has increased in size measuring 2.7 cm, although may have a slightly less thickened and nodular wall.   3.  Diffuse bronchiectasis most pronounced in the  right upper lobe.      Microbiology  BAL 04/28/2020 (from Dr. Cecile Hearing): AFB smear 3+, culture MAC (drug susceptibly testing sent to [facility]- pending)       6 MIN WALK (01/30/2021):      PFT (08/17/2021):      PFTs ( 02/16/2021):      ASSESSMENT AND PLAN:  Tiffany Chen is a 76 year old female with history of bronchiectasis, cavitary MAC lung infection, ruptured diverticulitis s/p colostomy in 2009, DM, ICH s/p embolization. She is here to follow up on bronchiectasis and MAC lung infection.     # Bronchiectasis/ Cavitary MAC lung infection: currently stable and asymptomatic. She was advised to start MAC antibiotic therapy due to the severity of her lesions and symptoms. She started on  IV antibiotic Amikacin home infusion for 3 months (08/14/2020 to 11/24/2020), azithromycin 250 mg daily, ethambutol 800 mg daily and rifampin 600 mg daily.  She was started on inhaled Arikayce on 11/23/2020.  Clofazimine was added on 1/11/20223. She have had right Robotic upper lobectomy with middle and lower  lobe wedge resection on 03/20/2021. She reports improved condition with current MAC antibiotic therapy. She reports increased non productive cough since her lobectomy surgery and no other symptom. She has been adherent to MAC antibiotics and air way clearance. Her current regimen included: azithromycin 250 mg daily, rifampin 600 mg daily, ethambutol 800 mg daily, clofazimine 100 mg daily (which was started 02/21/2021) and daily inhaled Arikayce. She reports exertional dyspnea but no other symptoms. She reports improved pulmonary condition since started  on MAC antibiotics. PFT from 08/11/202 wnl.[name] PFT from 02/16/2021 with ( FVC 2.00- 79%, FEV 1 1.60 - 85%, FEV1/FVC 80, DLCOc 80%).  CT chest from 06/2021 with improved findings compared to previous CT chest. CT Chest from 04/13/2020 with micronodular and tree in bud opacity consistent with MAC infection. Cavitary lesion to RUL about 2.7 cm. CT chest from 11/09/2020 with  worsening of nontuberculous mycobacterial disease infection involving the right lung (new thick walled cavitary 2.6 cm right apical nodule on series 2, image 393 adjacent 12 mm thick walled cavitary nodule is new. Worsening tree-in-bud nodularity and bronchiectasis in the right upper lobe). Ct chest from 01/19/2021 with worsenig cavitary nontuberculous mycobacterial disease has worsened with increasing size of cavitary lesions with associated increasing wall thickness. Sputum cultures from 08/2021 x 2 with negatige preliminary AFB. I have discussed air way clearance and MAC antibiotic therapy. I have recommended her to continue with air way clearance once a day with saline nebulizer solution followed by acapella device . Patient was instructed to use albuterol inhaler 2 puffs before air way clearance and as needed for cough, wheezing or shortness of breath. I have recommended her continue with Arikayce daily, azithromycin 250 mg daily, ethambutol 800 mg daily, clofazimine 100 mg daily and rifampin 600 mg daily. She is overdue for lab work. EKG and Audiogram (11/2021). I have reminded her to continue with lab work every month, sputum cultures for AFB and respiratory cultures x 3 per month, EKG and Audiogram every 3-4 months  and to continue with ishihara discrimination color chart. Patient was advised to monitor for respiratory symptoms and call back if any changes. Reminded about pending PFT and continue submitting sputum samples for culture.  ED precautions given.     # Immunizations: up to date.     "I personally spend a total of 60 Minutes in face to face and non face to face activities related to patient's visit today, excluding and separately reportable services/procedures."    Follow up with [name] in 2 months.     Zella Richer NP-BC  Pulmonary Medicine

## 2021-10-12 ENCOUNTER — Encounter (HOSPITAL_COMMUNITY): Payer: Self-pay | Admitting: Hospital

## 2021-10-15 ENCOUNTER — Ambulatory Visit (INDEPENDENT_AMBULATORY_CARE_PROVIDER_SITE_OTHER): Payer: Medicare Other | Admitting: Ophthalmology

## 2021-10-15 DIAGNOSIS — H2512 Age-related nuclear cataract, left eye: Secondary | ICD-10-CM | POA: Insufficient documentation

## 2021-10-15 DIAGNOSIS — H2511 Age-related nuclear cataract, right eye: Secondary | ICD-10-CM | POA: Insufficient documentation

## 2021-10-15 DIAGNOSIS — H25813 Combined forms of age-related cataract, bilateral: Secondary | ICD-10-CM | POA: Insufficient documentation

## 2021-10-15 HISTORY — DX: Combined forms of age-related cataract, bilateral: H25.813

## 2021-10-15 HISTORY — DX: Age-related nuclear cataract, left eye: H25.12

## 2021-10-15 NOTE — Progress Notes (Signed)
Combined forms of age-related cataract of both eyes  Assessment & Plan:  Visually significant cataract bilaterally.   Patient reports difficulty with activities of daily living including difficulty with reading, seeing captions on television and sewing. She is also unable to drive at night and color perception is affected. Risks of cataract extraction, benefit of visual improvement, versus alternative of not performing surgery reviewed. Discussed risks, including but not limited to bleeding, infection, retinal detachment, glaucoma , corneal edema, decreased or loss of vision and additional surgery . All questions were answered.  Patient consented to cataract extraction with lens implant, right eye then left eye.  IOL options reviewed .  Pt. Elected standard IOL for distance      Age-related nuclear cataract of right eye  -     Case Request: PHACOEMULSIFICATION, CATARACT, WITH IOL IMPLANT, Right Eye    Age-related nuclear cataract of left eye  -     Case Request: PHACOEMULSIFICATION, CATARACT, WITH IOL IMPLANT, Left Eye        No follow-ups on file.

## 2021-10-15 NOTE — Assessment & Plan Note (Signed)
Visually significant cataract bilaterally.   Patient reports difficulty with activities of daily living including difficulty with reading, seeing captions on television and sewing. She is also unable to drive at night and color perception is affected. Risks of cataract extraction, benefit of visual improvement, versus alternative of not performing surgery reviewed. Discussed risks, including but not limited to bleeding, infection, retinal detachment, glaucoma , corneal edema, decreased or loss of vision and additional surgery . All questions were answered.  Patient consented to cataract extraction with lens implant, right eye then left eye.  IOL options reviewed .  Pt. Elected standard IOL for distance

## 2021-10-16 ENCOUNTER — Encounter (HOSPITAL_BASED_OUTPATIENT_CLINIC_OR_DEPARTMENT_OTHER): Payer: Self-pay | Admitting: General Surgery

## 2021-10-16 ENCOUNTER — Ambulatory Visit: Payer: Medicare Other | Attending: Nurse Practitioner | Admitting: Nurse Practitioner

## 2021-10-16 ENCOUNTER — Ambulatory Visit (HOSPITAL_BASED_OUTPATIENT_CLINIC_OR_DEPARTMENT_OTHER): Payer: Medicare Other | Admitting: General Surgery

## 2021-10-16 ENCOUNTER — Other Ambulatory Visit (HOSPITAL_BASED_OUTPATIENT_CLINIC_OR_DEPARTMENT_OTHER): Payer: Medicare Other

## 2021-10-16 VITALS — BP 124/66 | HR 75 | Temp 97.4°F | Resp 18 | Wt 142.1 lb

## 2021-10-16 VITALS — BP 127/68 | HR 74 | Temp 97.6°F | Resp 17 | Ht 62.5 in | Wt 142.2 lb

## 2021-10-16 DIAGNOSIS — J479 Bronchiectasis, uncomplicated: Secondary | ICD-10-CM

## 2021-10-16 DIAGNOSIS — D809 Immunodeficiency with predominantly antibody defects, unspecified: Secondary | ICD-10-CM

## 2021-10-16 DIAGNOSIS — K435 Parastomal hernia without obstruction or  gangrene: Secondary | ICD-10-CM

## 2021-10-16 DIAGNOSIS — A319 Mycobacterial infection, unspecified: Secondary | ICD-10-CM | POA: Insufficient documentation

## 2021-10-16 DIAGNOSIS — K219 Gastro-esophageal reflux disease without esophagitis: Secondary | ICD-10-CM | POA: Insufficient documentation

## 2021-10-16 LAB — CBC WITH DIFF, BLOOD
ANC-Automated: 2.6 10*3/uL (ref 1.6–7.0)
ANC-Instrument: 2.6 10*3/uL (ref 1.6–7.0)
Abs Basophils: 0 10*3/uL (ref <0.2)
Abs Eosinophils: 0.2 10*3/uL (ref 0.0–0.5)
Abs Lymphs: 1.5 10*3/uL (ref 0.8–3.1)
Abs Monos: 0.6 10*3/uL (ref 0.2–0.8)
Basophils: 1 %
Eosinophils: 4 %
Hct: 36.6 % (ref 34.0–45.0)
Hgb: 12.2 gm/dL (ref 11.2–15.7)
Lymphocytes: 31 %
MCH: 30.5 pg (ref 26.0–32.0)
MCHC: 33.3 g/dL (ref 32.0–36.0)
MCV: 91.5 um3 (ref 79.0–95.0)
MPV: 10.8 fL (ref 9.4–12.4)
Monocytes: 11 %
Plt Count: 238 10*3/uL (ref 140–370)
RBC: 4 10*6/uL (ref 3.90–5.20)
RDW: 14.4 % — ABNORMAL HIGH (ref 12.0–14.0)
Segs: 53 %
WBC: 5 10*3/uL (ref 4.0–10.0)

## 2021-10-16 LAB — COMPREHENSIVE METABOLIC PANEL, BLOOD
ALT (SGPT): 22 U/L (ref 0–33)
AST (SGOT): 26 U/L (ref 0–32)
Albumin: 3.9 g/dL (ref 3.5–5.2)
Alkaline Phos: 56 U/L (ref 40–130)
Anion Gap: 9 mmol/L (ref 7–15)
BUN: 16 mg/dL (ref 8–23)
Bicarbonate: 27 mmol/L (ref 22–29)
Bilirubin, Tot: 0.28 mg/dL (ref <1.2)
Calcium: 9.2 mg/dL (ref 8.5–10.6)
Chloride: 105 mmol/L (ref 98–107)
Creatinine: 0.75 mg/dL (ref 0.51–0.95)
Glucose: 104 mg/dL — ABNORMAL HIGH (ref 70–99)
Potassium: 4.2 mmol/L (ref 3.5–5.1)
Sodium: 141 mmol/L (ref 136–145)
Total Protein: 6.7 g/dL (ref 6.0–8.0)
eGFR Based on CKD-EPI 2021 Equation: >60 mL/min/{1.73_m2}

## 2021-10-16 LAB — IGG, BLOOD: IGG: 1054 mg/dL (ref 700–1600)

## 2021-10-16 MED ORDER — RAMIPRIL 2.5 MG OR CAPS
2.5000 mg | ORAL_CAPSULE | Freq: Every day | ORAL | 3 refills | Status: DC
Start: 2021-10-16 — End: 2021-10-30

## 2021-10-16 NOTE — Patient Instructions (Signed)
-   Please continue with air way clearance one times a day ( saline solution and Aerobika/ acapella device).  - Continue with your inhalers as prescribed.  -Please continue with MAC antibiotic therapy as instructed.   - Please submit sputum samples for AFB and respiratory culture x 3 every 4-6 weeks.  - Continue with lab work (CBC, CMP) every other month (due ne ).  - Continue with Audiogram every 3-4 months ( due on 11/2021 )  - EKG every 3-4 months (due now )  - Perform Ishihara discrimination color chart 3 times a week minimum.  - PFTs (due nw.[name] )  - Monitor for acute respiratory symptoms and provide with a call back if worsen.   - Follow up with [name] in 2 months

## 2021-10-16 NOTE — Telephone Encounter (Signed)
Refill signed.

## 2021-10-18 ENCOUNTER — Ambulatory Visit
Admission: RE | Admit: 2021-10-18 | Discharge: 2021-10-18 | Disposition: A | Discharge status: Home / Self Care | Payer: Medicare Other | Attending: Critical Care Medicine | Admitting: Critical Care Medicine

## 2021-10-18 DIAGNOSIS — R0602 Shortness of breath: Secondary | ICD-10-CM | POA: Insufficient documentation

## 2021-10-18 DIAGNOSIS — J984 Other disorders of lung: Secondary | ICD-10-CM | POA: Insufficient documentation

## 2021-10-18 DIAGNOSIS — J479 Bronchiectasis, uncomplicated: Secondary | ICD-10-CM | POA: Insufficient documentation

## 2021-10-18 DIAGNOSIS — A310 Pulmonary mycobacterial infection: Secondary | ICD-10-CM | POA: Insufficient documentation

## 2021-10-19 ENCOUNTER — Encounter (HOSPITAL_BASED_OUTPATIENT_CLINIC_OR_DEPARTMENT_OTHER): Payer: Self-pay | Admitting: General Surgery

## 2021-10-19 NOTE — Progress Notes (Signed)
Minimally Invasive Surgery Return Visit  Date: October 19, 2021     Patient Name: Tiffany Chen   Medical Record #: [id number]   DOB: [birth year]  Age: 76 year old  Sex: female    Referring MD: Tiffany Shams, MD  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     Was last seen in clinic on 6/6 at which time she was recommended to undergo barium swallow, manometry, and CT with IV and rectal contrast then return to clinic to discuss surgical options again.     Her manometry was found to be overall normal. Barium swallow not yet completed, but is scheduled for August 1st. Patient states that she continues to be asymptomatic from her reflux as long as she takes her PPI. She is overall doing well from a respiratory standpoint. 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 Would prefer not to undergo anti-reflux surgery unless indicated as treatment against MAC infections.     Returns to discuss surgery after recent visit with [name] and [facility] meeting.    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       Past Surgical History  Past Surgical History:   Procedure Laterality Date    BRONCHOSCOPY  12/2020    COLOSTOMY      LUNG LOBECTOMY     Has an end colostomy in 2009, [place], [place]  Previous right upper lobectomy with Tiffany Chen in 03/2021 for MAC    Allergies  Allergies   Allergen Reactions    [facility] Flavor Other       Medications  Current Outpatient Medications   Medication Sig Dispense Refill    ARIKAYCE 590 MG/8.4ML SUSP Inhale the contents of one vial (513m) via Lamira device 1 time daily as directed. 28 each 5    atorvastatin (LIPITOR) 10 MG tablet Take 1 tablet (10 mg) by mouth daily.      azithromycin (ZITHROMAX) 250 MG tablet TAKE 1 TABLET BY MOUTH  DAILY 60 tablet 11    CLOFAZIMINE IRB 21-0063 50 MG CAPSULE Take 2 capsules (178m) by mouth once daily with food as directed. 200 capsule 0    CLOFAZIMINE IRB 21-0063 50 MG CAPSULE Take 2 capsules (1064m by mouth once daily with food as directed. 200 capsule 0    ethambutol (MYAMBUTOL) 400 MG tablet TAKE 2 TABLETS BY MOUTH  DAILY 120 tablet 11    ferrous sulfate 325 (65 Fe) MG tablet Take 1 tablet (325 mg) by mouth 3 times daily.      fluoxetine (PROZAC) 40 MG capsule Take 1 capsule (40 mg) by mouth daily. 90 capsule 3    metFORMIN (GLUCOPHAGE) 500 mg tablet Take 1 tablet (500 mg) by mouth 2 times daily (with meals).      neomycin 500 MG tablet Take 2 tablets (1000 mg) by mouth at noon and 2 tablets (1000 mg) at 8 PM on the day prior  to surgery. 4 tablet 0    omeprazole (PRILOSEC) 20 MG capsule TAKE 1 CAPSULE BY MOUTH IN  THE MORNING BEFORE  BREAKFAST 90 capsule 3    Pediatric Multivitamins-Fl (MULTIVITAMINS/FL PO)       ramipril (ALTACE) 2.5 MG capsule Take 1 capsule (2.5 mg) by mouth daily. 90 capsule 3    rifAMPin (RIFADIN) 300 MG capsule TAKE 2 CAPSULES BY MOUTH  DAILY 120 capsule 11    sodium chloride 7 % NEBU USE 1 VIAL VIA NEBULIZER  EVERY 12 HOURS 720 mL 3    VENTOLIN HFA 108 (90 Base) MCG/ACT inhaler USE 2 INHALATIONS BY MOUTH  EVERY 6 HOURS AS NEEDED FOR WHEEZING 72 g 3     Current Facility-Administered Medications   Medication Dose Route Frequency Provider Last Rate Last Admin    lidocaine (XYLOCAINE) 2 % viscous solution 1 mL  1 mL Other PRN Kazandra Forstrom, RyEthelle LyonMD        lidocaine (XYLOCAINE) 4 % topical   Other PRN Joncarlos Atkison, RyEthelle LyonMD           Social History  Social History     Socioeconomic History    Marital status: Widowed   Tobacco Use    Smoking status: Never    Smokeless tobacco: Never   Substance and Sexual Activity    Alcohol use: Not Currently    Drug use: Not Currently    Sexual activity: Not Currently   Social Activities of Daily Living Present    Military Service No    Blood Transfusions No    Caffeine Concern No    Occupational Exposure No    Hobby Hazards Yes     Comment: Gardening, MAC    Sleep Concern No    Stress Concern Yes     Comment: Husband death Cov19, 2020, sold home, rehomed pets. [place]    Weight Concern No    Special Diet Yes    Back Care No    Exercises Regularly Yes    Bike Helmet Use No    Seat Belt Use No    Performs Self-Exams No   Social History Narrative    Lives with brother Tiffany Chen home. Sister also lives in [place]  Family History   Problem Relation Name Age of Onset    Cancer Mother AnCaryl Comesnd throat    Heart Disease Mother AnEliane Chen  Congrestive heart failure due to cancer    Hypertension Mother Tiffany Chen     Psychiatry Mother Tiffany Chen          Bi polar, manic depressive    Cancer Father Tiffany Chen         Prostate    Diabetes Father Tiffany Chen     Stroke Father Tiffany Chen     Stroke Maternal Grandmother Tilden Dome     Cancer Brother Dian Situ         Skin (many locations)    Diabetes Brother Dian Situ        Review of Systems    As described in HPI.    Physical Examination  BP 127/68 (BP Location: Left arm, BP Patient Position: Sitting, BP cuff size: Regular)   Pulse 74   Temp 97.6 F (36.4 C) (Temporal)   Resp 17   Ht 5' 2.5" (1.588 m)   Wt 64.5 kg (142 lb 3.2 oz)   SpO2 99%   BMI 25.59 kg/m  Body mass index is 25.59 kg/m.  GENERAL: alert, oriented x 3, no acute distress   CHEST: unlabored respirations   ABDOMEN: soft, non tender, colostomy in place with large surrounding parastomal hernia    Diagnostic Testing/Labs:  Lab Results   Component Value Date    WBC 5.0 10/16/2021    RBC 4.00 10/16/2021    HGB 12.2 10/16/2021    HCT 36.6 10/16/2021    MCV 91.5 10/16/2021    MCHC 33.3 10/16/2021    RDW 14.4 (H) 10/16/2021    PLT 238 10/16/2021    MPV 10.8 10/16/2021    SEG 53 10/16/2021    LYMPHS 31 10/16/2021    MONOS 11 10/16/2021    EOS 4 10/16/2021    BASOS 1 10/16/2021     Lab Results   Component Value Date    BUN 16 10/16/2021    CREAT 0.75 10/16/2021    CL 105 10/16/2021    NA 141 10/16/2021    K 4.2 10/16/2021    [place] 9.2 10/16/2021    TBILI 0.28 10/16/2021    ALB 3.9 10/16/2021    TP 6.7 10/16/2021    AST 26 10/16/2021    ALK 56 10/16/2021    BICARB 27 10/16/2021    ALT 22 10/16/2021    GLU 104 (H) 10/16/2021     Lab Results   Component Value Date    NA 141 10/16/2021    K 4.2 10/16/2021    CL 105 10/16/2021    BICARB 27 10/16/2021    BUN 16 10/16/2021    CREAT 0.75 10/16/2021    GLU 104 (H) 10/16/2021     9.2 10/16/2021     Lab Results   Component Value Date    A1C 6.8 (H) 06/26/2021     Lab Results   Component Value Date    CHOL 163 05/16/2021    HDL 53 05/16/2021    LDLCALC 81 05/16/2021    TRIG 145 05/16/2021     No  results found for: TSH      Imaging/Procedures:    Manometry completed 6/27  Barium swallow scheduled for 8/1    EGD with [facility]: 13.2% acid exposure with demeesters 40-60.      X-Ray Esophagus Double Contrast    Result Date: 08/07/2021  IMPRESSION: 1. No focal abnormality noted along the esophagus. 2. Questionable tiny hiatal hernia. No gastroesophageal reflux was able to be elicited during the  study. Mild esophageal dysmotility.       Assessment/Plan:  Rhys Anchondo is a 76 year old female, Body mass index is 25.59 kg/m., with asymptomatic GERD with history of RUL MAC requiring lobectomy as well as a symptomatic parastomal hernia:.    Possible that MAC could be related to reflux, although definitely no guarantee that anti-reflux operation will "cure" her of MAC.     After discussed in [place] and with [name], I advised that we should be aggressive in treating her severe reflux as it could be causing her MAC infections. Will plan to perform hiatal hernia repair and fundoplication at time of her open ventral hernia repair/ostomy takedown. We will begin with ventral hernia and ostomy takedown; advised that if procedure is complicated or she might no be able to physiologically handle a hiatal dissection we will not do the procedure and rather save it for after she has recovered. She understands and wishes to proceed. She does need SLP study prior to surgery to rule out aspiration on swallow as cause of her MAC. Plan for surgery once SLP eval complete.

## 2021-10-22 ENCOUNTER — Ambulatory Visit
Admission: RE | Admit: 2021-10-22 | Discharge: 2021-10-22 | Disposition: A | Discharge status: Home / Self Care | Payer: Medicare Other | Attending: Allergy | Admitting: Allergy

## 2021-10-22 VITALS — BP 136/74 | HR 65 | Temp 97.1°F | Resp 17 | Wt 144.6 lb

## 2021-10-22 DIAGNOSIS — D809 Immunodeficiency with predominantly antibody defects, unspecified: Secondary | ICD-10-CM | POA: Insufficient documentation

## 2021-10-22 LAB — IGG, BLOOD: IGG: 978 mg/dL (ref 700–1600)

## 2021-10-22 MED ORDER — ACETAMINOPHEN 325 MG PO TABS
650.0000 mg | ORAL_TABLET | Freq: Once | ORAL | Status: AC
Start: 2021-10-22 — End: 2021-10-22
  Administered 2021-10-22: 650 mg via ORAL
  Filled 2021-10-22: qty 2

## 2021-10-22 MED ORDER — DIPHENHYDRAMINE HCL 25 MG OR TABS OR CAPS CUSTOM
25.0000 mg | ORAL_CAPSULE | Freq: Once | ORAL | Status: DC | PRN
Start: 2021-10-22 — End: 2021-10-26

## 2021-10-22 MED ORDER — IMMUNE GLOBULIN (HUMAN) 20 GM/200ML IJ SOLN
0.5000 g/kg | Freq: Once | INTRAMUSCULAR | Status: AC
Start: 2021-10-22 — End: 2021-10-22
  Administered 2021-10-22: 25 g via INTRAVENOUS
  Filled 2021-10-22: qty 200

## 2021-10-22 MED ORDER — CETIRIZINE HCL 10 MG OR TABS
10.0000 mg | ORAL_TABLET | Freq: Once | ORAL | Status: AC
Start: 2021-10-22 — End: 2021-10-22
  Administered 2021-10-22: 10 mg via ORAL
  Filled 2021-10-22: qty 1

## 2021-10-22 MED ORDER — DEXTROSE 5 % IV SOLN
INTRAVENOUS | Status: DC
Start: 2021-10-22 — End: 2021-10-23

## 2021-10-22 NOTE — Interdisciplinary (Signed)
Non-Chemotherapy Infusion Nursing Note - Tiffany Chen is a 76 year old female who presents for infusion of IVIG 25g.    Vitals:    10/22/21 0835 10/22/21 1051   BP: 135/74 136/74   BP Location: Right arm Right arm   BP Patient Position: Sitting Sitting   Pulse: 71 65   Resp: 17 17   Temp: 97 F (36.1 C) 97.1 F (36.2 C)   TempSrc: Temporal Temporal   SpO2: 97% 99%   Weight: 65.6 kg (144 lb 10 oz)      Pain Score: NA (pre med, non-pain or scheduled)  Body surface area is 1.7 meters squared.  Body mass index is 26.03 kg/m.    Medications   dextrose 5% infusion (0 mL IntraVENOUS Completed 10/22/21 1053)   diphenhydrAMINE (BENADRYL) tablet or capsule 25 mg (25 mg Oral Not given 10/22/21 0844)   acetaminophen (TYLENOL) tablet 650 mg (650 mg Oral Given 10/22/21 0843)   cetirizine (ZYRTEC) tablet 10 mg (10 mg Oral Given 10/22/21 0843)   immune globulin (human) (IgG) 25 g in 250 mL (GAMUNEX-C) infusion (0 g IntraVENOUS Completed 10/22/21 1053)       Pre-treatment nursing assessment:  No problems identified upon assessment. Denies pain, N/V, SOB. VSS. No hx infusion rxn.    L AC 24G PIV x1 attempt + BR. IgG re-drawn (last drawn on 10/10, patient states she requested phlebotomist to not collect then but was accidentally drawn anyway).    Treatment parameters: NA    [name] tolerated treatment well. Titrated to max rate as ordered. VSS, no s/s rxn.   Post blood return: Brisk  Post-Flush: NS and Discontinued IV    Patient Education  Learner: Patient   Barriers to learning: No Barriers  Readiness to learn: Acceptance  Method: Explanation    Treatment Education: Information/teaching given to patient including: signs and symptoms of infection, bleeding, adverse reaction(s), symptom control, and when to notify MD.    [name] Prevention Education: Instructed patient to call for assistance.    Pain Education: Patient instructed to contact nurse if pain should develop or if their current  pain therapy becomes ineffective.    Response: Verbalizes understanding    Discharge Plan  Discharge instructions given to patient.  Future appointments given and reviewed with treatment plan. Next appointment scheduled 11/13.  Discharge Mode: Ambulatory  Discharge Time: 1055  Accompanied by: Self  Discharged To: Home

## 2021-10-24 ENCOUNTER — Ambulatory Visit (INDEPENDENT_AMBULATORY_CARE_PROVIDER_SITE_OTHER): Payer: Medicare Other

## 2021-10-24 DIAGNOSIS — Z01818 Encounter for other preprocedural examination: Secondary | ICD-10-CM

## 2021-10-24 DIAGNOSIS — H25813 Combined forms of age-related cataract, bilateral: Secondary | ICD-10-CM

## 2021-10-24 DIAGNOSIS — K219 Gastro-esophageal reflux disease without esophagitis: Secondary | ICD-10-CM

## 2021-10-24 LAB — ECG 12-LEAD
ATRIAL RATE: 65 {beats}/min
ECG INTERPRETATION: NORMAL
P AXIS: 31 degrees
PR INTERVAL: 172 ms
QRS INTERVAL/DURATION: 70 ms
QT: 418 ms
QTc (Bazett): 434 ms
R AXIS: 58 degrees
T AXIS: 58 degrees
VENTRICULAR RATE: 65 {beats}/min

## 2021-10-26 ENCOUNTER — Telehealth (INDEPENDENT_AMBULATORY_CARE_PROVIDER_SITE_OTHER): Payer: Self-pay | Admitting: Audiology

## 2021-10-26 ENCOUNTER — Encounter (INDEPENDENT_AMBULATORY_CARE_PROVIDER_SITE_OTHER): Payer: Self-pay | Admitting: Hospital

## 2021-10-26 NOTE — Telephone Encounter (Signed)
Called patient to advise of a change in the [place] appointment for 11/15/21  stated that the 11 AM will be at 10:15 AM instead, No answer left a detail message with the information.

## 2021-10-29 LAB — AFB CULTURE W/STAIN
AFB Culture Result: NO GROWTH
AFB Culture Result: NO GROWTH
AFB Smear Result: NEGATIVE
AFB Smear Result: NEGATIVE

## 2021-10-30 ENCOUNTER — Encounter (INDEPENDENT_AMBULATORY_CARE_PROVIDER_SITE_OTHER): Payer: Self-pay

## 2021-10-30 DIAGNOSIS — I10 Essential (primary) hypertension: Secondary | ICD-10-CM

## 2021-10-30 MED ORDER — RAMIPRIL 2.5 MG OR CAPS
2.5000 mg | ORAL_CAPSULE | Freq: Every day | ORAL | 1 refills | Status: DC
Start: 2021-10-30 — End: 2022-08-13

## 2021-10-30 NOTE — Telephone Encounter (Signed)
From: [name]  To: Shellia Cleverly, MD  Sent: 10/30/2021 9:29 AM PDT  Subject: Ramipril 2.5 Mg    I have been trying to order for months. On Oct 10, Ravina Milner Tobie Poet LVN sent message prescription sent to [facility]. [facility] said they sent message for clarification and no response. [facility] has provided direct number [phone] for immediate response. This system is failing me and it does not matter if pharmacy or doctors. I have not had .med for weeks. Assume this primary doctor as under need for diabetic care. Please HELP ME! [place] [phone].

## 2021-10-30 NOTE — Patient Instructions (Signed)
[facility] PREOPERATIVE SURGICAL INFORMATION     DAY OF SURGERY      Your Surgery is scheduled on 11/08/21 and 11/22/21  [facility] OR Desk will call you the day before your surgery with your arrival time. If you do not hear from them by 1:30, please call the OR directly. Their number is [phone].     ?  [place], [place], [place] [address]                                                                                                                                       PARKING     Patient Parking: With a disabled placard, parking is free in any patient or visitor spot.   Self-parking is available in the parking lot across from the [facility]), as well as in the [employer] in the numbered Patient/Visitor parking spaces. Rate is $5 for half-day (up to four hours) and $10 for full day parking (longer than four hours). The machines accept credit card or cash (exact bills required $1 or $5 only and the machine does not give change).   Note your parking space number and pay at the pay stations using cash or credit card. You do NOT need to display your ticket on your dashboard. $10 parking allows you in-and-out privileges for 24 hours. Just park in any visitor spot when you return and display the ticket on the dashboard of your car.      [employer] Alert:   Drop-off/Pick-up Changes Starting 08/10/21  Drop-off for eye surgery patients - moved to the first floor of the [employer].   Pick-up for eye surgery patients - moved to curbside area on [address], [address] building.   Self-parking - same location at [address], [place] of the [place] building, and at the [employer].  For maps and the latest updates - go to [email or web address] and click on Contact us.     As we anticipate and plan for the growing health care needs of our communities, our [employer] is undergoing  long-term construction activity and expansion. Please plan for possible traffic delays and detours in the area. Thank you for your patience.                         QUESTIONS    If you have any questions between now and the day of your surgery, please call the [facility] Department at [phone] or [place]       Take your regularly prescribed medications as scheduled with a small sip of water on the morning of surgery     Directions for Diabetes medication: HOLD Metformin the  morning of surgery       AFTER YOUR VISIT WITH [place], IF YOU START TAKING A NEW MEDICATION BEFORE SURGERY, PLEASE CALL us TO MAKE SURE IT IS SAFE TO TAKE & WILL NOT AFFECT YOUR SURGERY.        Medications prescribed today:              Moxifloxacin eye drops (antibiotic) 1 drop on operated eye 4x/day. Start 2 days before surgery            -  Refills #3    Ketorolac eye drops (NSAID) 1 drop on operated eye 2x/day. Start 2 days before surgery       -  Refills #3      - Alternative: Flurbiprofen 0.03% 4x/day or Bromfenac 1x/day.    Prednisolone eye drops (steroid) - 1 drop on operated eye 4x/day            - Refills #3    You will start using all these eye drops after surgery.  Wait 5 minutes between drops.  Continue to use all of your regular drops (if you have any) up to and including the morning of surgery.  You will be given written instructions at your day 1 post op visit that will explain how long to continue each eye drop.      If you have any questions regarding these medications, call Isaiah Blakes, NP at [phone]                                                   Email: [email or web address]    EATING/DRINKING     DO NOT EAT ANY SOLID FOODS AFTER MIDNIGHT ON THE DAY OF SURGERY. Please no candy, mints, or gum.   Sips of clear liquids (water, black coffee or black tea, soda or clear apple juice) are allowed up to 3 hours prior to your arrival for surgery.      On  The Day of Your Surgery:     You MUST bring proof of identification (with picture) on the day of surgery: valid drivers license, [place] ID Card, or passport.  Your insurance card is not needed.    Please wear clean loose-fitting clothes with a button or zipper top (no t-shirts) and leave jewelry and valuables at home.  Shower prior to surgery, but do not apply any lotion, oil, or cream to your face or chest. No perfume, aftershave, or makeup (foundation, blush, powder, eyeliner, eyeshadow, mascara, or lipstick).   Do not wear contact lenses.   Follow instructions above for medications.   Please bring a pair of dark glasses with you on the day of surgery. If you would like to purchase the postop kit ( dark glasses, extra eye shield and tape ) this is available at the [facility] front desk. The cost for the postop kit is $ 10.00 ( cash only.)   Check in at the location above.   If you are a woman of child bearing age, please note that you may be asked to give a urine sample upon check-in.   You will meet your anesthesia and surgery teams in the preoperative holding area before surgery.   Once surgery is over, you will wake up in the recovery room. You will be given something to drink and allowed to rest.   Once  your time in the recovery room is complete, your post-operative instructions will be reviewed with you and your support person.   Before you leave, you will be given a followup appointment with your surgeon.  If you are going home after your surgery, please make sure to arrange for an adult to drive you home. You CANNOT drive yourself home.     After Surgery:    If you have any questions after your surgery call [phone] between 7am and 5pm. If you have urgent questions after hours or on the weekend, call [phone] and ask for the ophthalmologist on call.     Your medical records are available to you via mychart at [email or web address]     2

## 2021-10-30 NOTE — Progress Notes (Signed)
AUDIOMETRIC ASSESSMENT    History:  Tiffany Chen, a 76 year old female, was referred by Dr. Adela Lank for continued audiometric evaluation due to potential ototoxic medication.  Patient noted no subjective hearing change since her last Audiogram at [facility] 09/04/2021.        Otoscopy:  Otoscopy revealed clear external auditory canals and good tympanic membrane visualization bilaterally.    Subjective Results:  Pulsed tone testing utilizing insert ear phones indicated bilateral thresholds WNLs [phone] Hz, with sloping mild to moderate sensorineural loss thresholds at 2000 Hz and above. Speech audiometry revealed excellent word recognition of phonetically balanced English words (100%) bilaterally. Speech reception thresholds (SRTs) were consistent with pure tone averages (PTAs), indicating good reliability.    Impression:  Comparison with test results obtained at this clinic 09/04/2021 indicated stable air conduction thresholds bilaterally across the frequency range (i.e. no air conduction threshold change greater than 10 dB) with the exception of an improved threshold right  ear (15 dB) at 3000 Hz. Speech audiometry revealed stable word recognition of phonetically balanced English word ability bilaterally.    Test results were discussed with the patient, and patient questions were answered.    Plan:    * Confirmed with patient that she has audiograms scheduled every three months during her course of ototoxic medication.   * Patient took photo of audiogram and was given a Xerox copy test results.                      Audiogram viewable in EPIC "Media" tab.    Neal Dy  Senior Audiologist  [facility]  Otolaryngology / Head & Neck Surgery and Audiology

## 2021-10-30 NOTE — Telephone Encounter (Addendum)
Sent pt MyChart message, waiting for response.      ---------------------------------------------------------------------------------------------    Spoke w/ Marcie Bal from [place], rx sent 10/16/21 was NOT received. Will need new rx.        Melisa Sharen Counter, CPhT  ([facility])      ACE Inhibitor Refill Protocol    Last visit in enc specialty: 04/23/2021     Recent Visits in This Encounter Department       Date Provider Department Visit Type Primary Dx    04/23/2021 Shellia Cleverly, MD [place] [facility] Visit Type 2 diabetes mellitus without complication, without long-term current use of insulin (CMS-HCC)           Population Health Visits  Recent [facility] Visits    None       Next f/u appt due:  Return in about 3 months (around 07/23/2021) for Medicare Annual Wellness visit.   Next appt in enc specialty: 12/18/2021      Future Appointments 10/30/2021 - 10/29/2026        Date Visit Type Department Provider     11/01/2021 10:30 AM NP PHONE ASSESSMENT [facility]I ANESTHESIA PRE-OP Matthess, Bernarda Caffey, NP    Appointment Notes:     [phone] Preop confirmed w/pt 10/16/23moDm/oral A1c 6.8 06/2021 [facility] [place] specialist Ht 5'2.5 wt 140lbs dos/sister Mary PHACOEMULSIFICATION, CATARACT, WITH IOL IMPLANT, RIGHT EYEDr Alfonse Spruce sx 11/08/21 [place]             11/09/2021  8:45 AM POST OP OPHTHALMOLOGY [place] [facility] OPHTHALMOLOGY Gilda Crease, MD    Appointment Notes:     1 DAY PO             11/15/2021 10:15 AM AUDIOGRAM [place] [facility] HNS AUDIOLOGY Barbaraann Share    Appointment Notes:     Ototoxic Monitoring             11/19/2021  9:00 AM LVL 3 - 2 [place] INFUSION, 4TH FLOOR    Appointment Notes:     IVIG 25G Q 4weeks Pt wants 4th Fl only             11/23/2021  8:45 AM POST OP OPHTHALMOLOGY [place] [facility] OPHTHALMOLOGY Gilda Crease, MD    Appointment Notes:     1 DAY PO             12/13/2021  9:00 AM ST VIDEO  SWALLOW [place] [facility] Fredrich Romans., SLP             12/13/2021  9:30 AM X-RAY SWALLOWING FUNCTION CINE/VIDEO [place] [facility] [place] DIAGNOSTIC ROOM 4    Appointment Notes:     SPEECH             12/17/2021  9:00 AM LVL 3 - 2 [place] INFUSION, 4TH FLOOR    Appointment Notes:     IVIG 25G Q 4weeks Pt wants 4th Fl only             12/18/2021  3:20 PM [place] [facility] [place], [place] Ginny Forth, MD    Appointment Notes:     Annual Medicare Wellness Visit             12/19/2021  9:30 AM RETURN PULM GEN  [facility]MON[facility] GENERAL Katheren Shams, MD  Appointment Notes:     4 months follow up TB/MAC             01/10/2022 10:00 AM [place] PFT LAB [email or web address]    Appointment Notes:     MAI (mycobacterium avium-intracellulare) (CMS-HCC) [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             01/14/2022 10:00 AM [place] PFT LAB HCHPFT3    Appointment Notes:     MAI (mycobacterium avium-intracellulare) (CMS-HCC) [H84.0]Cavitary lesion of lung [J98.4]Bronchiectasis without complication (CMS-HCC) [O96.2].             01/16/2022 10:00 AM [place] PFT LAB HCHPFT3    Appointment Notes:     MAI (mycobacterium avium-intracellulare) (CMS-HCC) [X52.0]Cavitary lesion of lung [J98.4]Bronchiectasis without complication (CMS-HCC) [W41.3].             01/30/2022 11:00 AM RETURN IMMUNODEFICIENCY [place] [facility] Maudie Mercury Bedelia Person, MD    Appointment Notes:     Immuno 6 mon follow up             02/28/2022  1:00 PM AUDIOGRAM [place], Genevieve Newman    Appointment Notes:     Ototoxic Monitoring             05/30/2022  1:00 PM AUDIOGRAM [facility] HNS AUDIOLOGY Barbaraann Share    Appointment Notes:     Ototoxic Monitoring                         Per OV 04/23/21:  Reason for Visit: Establish care     HPI: T2DM  - Currently on Metformin 500 mg BID (decreased from 1000 mg BID 11/2020)  - On Ramipril for kidney protection  - On Atorvastatin  - No dx HTN/HLD  ASSESSMENT AND PLAN:  Type 2 diabetes mellitus without complication, without long-term current use of insulin (CMS-HCC)  Last Hgb A1c 7.0 in 03/2021.  -     Continue Metformin 500 mg BID   -     Random Urine Microalb/Creat Ratio Panel; Future  -     Foot exam completed today  -     Ophtho f/u scheduled for 06/2021   Return in about 3 months (around 07/23/2021) for Medicare Annual Wellness visit.       DISPENSE HX:          LABS required:  (Q year Potassium, Creatinine)    Lab Results   Component Value Date    NA 141 10/16/2021    K 4.2 10/16/2021    CL 105 10/16/2021    BICARB 27 10/16/2021    BUN 16 10/16/2021    CREAT 0.75 10/16/2021       Lab Results   Component Value Date    GFRNON >60 11/06/2020    EGFRCKDEPI >60 10/16/2021         Monitoring required:  (Q year BP) (pregnancy prn)  *If pt is pregnant, DEFER TO MD*    (BP range: KGMWNUUV=25-366  YQIHKVQQV=95-63)  Blood Pressure   10/22/21 136/74   10/16/21 127/68   10/16/21 124/66       Pulse Readings from Last 3 Encounters:   10/22/21 65   10/16/21 74   10/16/21 75         Last Digital Health Monitoring Vitals:        Last MyChart BP Values:  Last Pt Entered MyChart BP Values:

## 2021-10-31 MED ORDER — RAMIPRIL 2.5 MG OR CAPS
2.5000 mg | ORAL_CAPSULE | Freq: Every day | ORAL | 0 refills | Status: DC
Start: 2021-10-31 — End: 2021-10-31

## 2021-10-31 NOTE — Anesthesia Preprocedure Evaluation (Addendum)
ANESTHESIA PRE-OPERATIVE EVALUATION    Patient Information    Name: Tiffany Chen    MRN: [id number]    DOB: 1945-05-29    Age: 76 year old    Sex: female  Procedure(s) with comments:  PHACOEMULSIFICATION, CATARACT, WITH IOL IMPLANT, RIGHT EYE - Right Eye then Left Eye      Pre-op Vitals:   There were no vitals taken for this visit.        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 Lives on second floor and walks hilly terrain. Sometimes need to stop and rest due to breathing.),  able to dress/bathe self,  has not fallen in the last 6 months,  This is a phone interview    Patient lives with by self - below her sister's home     Activity level: walks 1 mile daily, and 1-2 days up to 4 miles.  Up and down hills. Has walking sticks.  Climbing stairs to get in and out of the house. Independent with all ADLs    Stated Height: 5'2.5    Stated Weight:138 lbs     BMI: 26 based on stated height and weight    Cardiovascular:  no CAD/Angina/MI/CABG/Stents, Anti-platelet drugs: No,    hypertension,  no dysrhythmias,  No activity related chest pain or SOB. Denies palpitations.    HX: HLD and HTN    Activity level: walks 1 mile daily, and 1-2 days up to 4 miles.  Up and down hills. Has walking sticks.  Climbing stairs to get in and out of the house. Independent with all ADLs    Followed by [place] PCP   Tama Headings, MD  Last seen 6 months ago         EKG   08/23/2020: sinus rhythm with PAC, 65   Anesthesia History:  negative anesthesia history ROS  no history of anesthetic complications,  no PONV,  no history of difficult IV access,  chronic pain patient (Colostomy with hernia,  causes some pain),  no family history of anesthetic complications,   Pulmonary:   no asthma,  no home oxygen use,  no sleep apnea,  no recent URI/pneumonia,  RUL lobectomy with middle wedge resection 01/2021.  MAI.    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 Currently resumed all 3 medications.    Monthly IVIG infusions started 01/16/21 to help with MAC infection tx    Followed by  Pulmonology  Last seen 10/16/21    Has chronic mild cough that is stable. Occasional dyspnea with moderate exertion -stable    Changed diet, dropped weight, sleeping propped up has improved her symptoms.    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),  no chemotherapy,  no radiation treatment,      GI/Hepatic:  GERD,  no liver disease,  no pancreatic disease,  no bowel obstruction,  Ruptured diverticulitis S/P colostomy (current) in 2009    Colostomy with hernia, causes some pain  Having takedown sx 01/2022 Infectious Disease:  negative for infectious disease  no hepatitis,  no tuberculosis,  See pulmonary    Previous COVID-19 dx: denies            Renal:  negative renal ROS  10/16/21 14:10  Creatinine: 0.75  eGFR Based on CKD-EPI 2021 Equation: >60 Endocrine/Other:  diabetes,    no arthritis (Neck - no pain),   no back pain (Able to lie flat),   DM2: Metformin   FSB:  does not check   06/26/21 10:43  Glycated  Hemoglobin: 6.8 (H)       Pregnancy History:   Pediatrics:         Pre Anesthesia Testing (PCC/CPC) notes/comments:    [facility] Test & records reviewed by [facility] Provider.                         Patient to be driven home/assisted by sister, Stanton Kidney   Pre-op instructions given verbally and via my chart                  Physical Exam    Airway:     Inter-inciser distance 3-4 cm  Prognanth Able    Mallampati: II  Neck ROM: full  TM distance: 4-5 cm  Short thick neck: No          Cardiovascular:  - cardiovascular exam normal             Pulmonary:  - pulmonary exam normal              Neuro/Neck/Skeletal/Skin:  - Neck/Neuro/Skeletal/Skin exam normal          Dental:  - normal exam    Abdominal:       General: normal weight      Additional Clinical Notes:   Other Physical Exam Findings: No physical exam. This is a phone interview.          Last OSA (STOP BANG) Score:   No data recorded    Past Medical History:   Diagnosis Date   . Acquired tracheobronchomegaly with bronchiectasis (CMS-HCC) 05/22/2020   . Cavitary lesion of lung 07/24/2020   . Depression    . DM (diabetes mellitus) (CMS-HCC)    . Gastroesophageal reflux disease, unspecified whether esophagitis present 10/12/2020    Added automatically from request for surgery [id number]   . 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     Past Surgical History:   Procedure Laterality Date   . BRONCHOSCOPY  12/2020   . COLOSTOMY     . LUNG LOBECTOMY  Social History     Socioeconomic History   . Marital status:  Widowed   Tobacco Use   . Smoking status: Never   . Smokeless tobacco: Never   Substance and Sexual Activity   . Alcohol use: Not Currently   . Drug use: Not Currently   . Sexual activity: Not Currently   Other Topics Concern   . Military Service No   . Blood Transfusions No   . Caffeine Concern No   . Occupational Exposure No   . Hobby Hazards Yes     Comment: Gardening, MAC   . Sleep Concern No   . Stress Concern Yes     Comment: Husband death Cov19, 2020, sold home, rehomed pets. [place]   . Weight Concern No   . Special Diet Yes   . Back Care No   . Exercises Regularly Yes   . Bike Helmet Use No   . Seat Belt Use No   . Performs Self-Exams No   Social History Narrative    Lives with brother Jeneen Rinks at home. Sister also lives in [place] Determinants of Health      Exercise is Medicine (EIM)     Alcohol Use: Not on file       Current Outpatient Medications   Medication Sig Dispense Refill   . ARIKAYCE 590 MG/8.4ML SUSP Inhale the contents of one vial (517m) via Lamira device 1 time daily as directed. 28 each 5   . atorvastatin (LIPITOR) 10 MG tablet Take 1 tablet (10 mg) by mouth daily.     .Marland Kitchenazithromycin (ZITHROMAX) 250 MG tablet TAKE 1 TABLET BY MOUTH  DAILY 60 tablet 11   . CLOFAZIMINE IRB 21-0063 50 MG CAPSULE Take 2 capsules (1038m by mouth once daily with food as directed. 200 capsule 0   . CLOFAZIMINE IRB 21-0063 50 MG CAPSULE Take 2 capsules (10038mby mouth once daily with food as directed. 200 capsule 0   . ethambutol (MYAMBUTOL) 400 MG tablet TAKE 2 TABLETS BY MOUTH  DAILY 120 tablet 11   . ferrous sulfate 325 (65 Fe) MG tablet Take 1 tablet (325 mg) by mouth 3 times daily.     . fluoxetine (PROZAC) 40 MG capsule Take 1 capsule (40 mg) by mouth daily. 90 capsule 3   . metFORMIN (GLUCOPHAGE) 500 mg tablet Take 1 tablet (500 mg) by mouth 2 times daily (with meals).     . neomycin 500 MG tablet Take 2 tablets (1000 mg) by mouth at noon and 2 tablets (1000 mg) at 8 PM on the day prior to  surgery. 4 tablet 0   . omeprazole (PRILOSEC) 20 MG capsule TAKE 1 CAPSULE BY MOUTH IN  THE MORNING BEFORE  BREAKFAST 90 capsule 3   . Pediatric Multivitamins-Fl (MULTIVITAMINS/FL PO)      . ramipril (ALTACE) 2.5 MG capsule Take 1 capsule (2.5 mg) by mouth daily. 90 capsule 1   . rifAMPin (RIFADIN) 300 MG capsule TAKE 2 CAPSULES BY MOUTH  DAILY 120 capsule 11   . sodium chloride 7 % NEBU USE 1 VIAL VIA NEBULIZER  EVERY 12 HOURS 720 mL 3   . VENTOLIN HFA 108 (90 Base) MCG/ACT inhaler USE 2 INHALATIONS BY MOUTH  EVERY 6 HOURS AS NEEDED FOR WHEEZING 72 g 3     Current Facility-Administered Medications   Medication Dose Route Frequency Provider Last Rate Last Admin   . lidocaine (XYLOCAINE) 2 % viscous solution 1 mL  1 mL Other PRN Broderick, Ethelle Lyon, MD       . lidocaine (XYLOCAINE) 4 % topical   Other PRN Broderick, Ethelle Lyon, MD         Allergies   Allergen Reactions   . [employer]  Other       Labs and Other Data  Lab Results   Component Value Date    NA 141 10/16/2021    K 4.2 10/16/2021    CL 105 10/16/2021    BICARB 27 10/16/2021    BUN 16 10/16/2021    CREAT 0.75 10/16/2021    GLU 104 (H) 10/16/2021    Solomon 9.2 10/16/2021     Lab Results   Component Value Date    AST 26 10/16/2021    ALT 22 10/16/2021    ALK 56 10/16/2021    TP 6.7 10/16/2021    ALB 3.9 10/16/2021    TBILI 0.28 10/16/2021    DBILI <0.2 07/17/2020     Lab Results   Component Value Date    WBC 5.0 10/16/2021    RBC 4.00 10/16/2021    HGB 12.2 10/16/2021    HCT 36.6 10/16/2021    MCV 91.5 10/16/2021    MCHC 33.3 10/16/2021    RDW 14.4 (H) 10/16/2021    PLT 238 10/16/2021    MPV 10.8 10/16/2021    SEG 53 10/16/2021    LYMPHS 31 10/16/2021    MONOS 11 10/16/2021    EOS 4 10/16/2021    BASOS 1 10/16/2021     No results found for: INR, PTT  No results found for: ARTPH, ARTPO2, ARTPCO2    Anesthesia Plan:  Risks and Benefits of Anesthesia  I have personally performed an appropriate pre-anesthesia physical exam of the patient (including  heart, lungs, and airway) prior to the anesthetic and reviewed the pertinent medical history, drug and allergy history, laboratory and imaging studies and consultations.   I have determined that the patient has had adequate assessment and testing.  I have validated the documentation of these elements of the patient exam and/or have made necessary changes to reflect my own observations during my pre-anesthesia exam.  Anesthetic techniques, invasive monitors, anesthetic drugs for induction, maintenance and post-operative analgesia, risks and alternatives have been explained to the patient and/or patient's representatives.    I have prescribed the anesthetic plan:         Planned anesthesia method: Monitored Anesthesia Care         ASA 3 (Severe systemic disease)     Potential anesthesia problems identified and risks including but not limited to the following were discussed with patient and/or patient's representative: Adverse or allergic drug reaction, Recall, Ocular injury, Dental injury or sore throat, Nerve injury, Injury to brain, heart and other organs and Death    No Beta Blocker Indicated: Patient not on beta blockers    Planned monitoring method: Routine monitoring  Comments: (NP note dated 11/01/21  Pre-op instructions given verbally and via my chart   Hold Altace and Metformin the am of sx     11/08/21  GERD.  BS  127.  Took fluoxetine and omeprazole at 6:15 am.  )    Informed Consent:  Anesthetic plan and risks discussed with Patient.  Use of blood products discussed with patient who consented to blood products.         Plan discussed with OR Nurse and Surgeon.

## 2021-10-31 NOTE — Telephone Encounter (Signed)
Re: temp supply, see separate MyChart encounter 10/30/21 for details.  Per pt MyChart response:                *The requested med passed all protocol parameters in the associated med info guide - Protocol details reviewed by pharmacist.       ACE Inhibitor Refill Protocol    Last visit in enc specialty: 04/23/2021     Recent Visits in This Encounter Department       Date Provider Department Visit Type Primary Dx    04/23/2021 Shellia Cleverly, MD [facility] Office Visit Type 2 diabetes mellitus without complication, without long-term current use of insulin (CMS-HCC)           Population Health Visits  Recent [facility] Visits    None       Next appt in enc specialty: 12/18/2021      Future Appointments 10/31/2021 - 10/30/2026        Date Visit Type Department Provider     11/01/2021 10:30 AM NP PHONE ASSESSMENT [facility]I ANESTHESIA PRE-OP Matthess, Bernarda Caffey, NP    Appointment Notes:     [phone] Preop confirmed w/pt 10/16/23moDm/oral A1c 6.8 06/2021 Lung Dz [place] specialist Ht 5'2.5 wt 140lbs dos/sister Mary PHACOEMULSIFICATION, CATARACT, WITH IOL IMPLANT, RIGHT EYEDr Alfonse Spruce sx 11/08/21 [place]             11/09/2021  8:45 AM POST OP OPHTHALMOLOGY [place] [facility] OPHTHALMOLOGY Gilda Crease, MD    Appointment Notes:     1 DAY PO             11/15/2021 10:15 AM AUDIOGRAM [place] [facility] HNS AUDIOLOGY Barbaraann Share    Appointment Notes:     Ototoxic Monitoring             11/19/2021  9:00 AM LVL 3 - 2 [place] [facility] INFUSION, 4TH FLOOR    Appointment Notes:     IVIG 25G Q 4weeks Pt wants 4th Fl only             11/23/2021  8:45 AM POST OP OPHTHALMOLOGY [place] SHILEY OPHTHALMOLOGY Gilda Crease, MD    Appointment Notes:     1 DAY PO             12/13/2021  9:00 AM ST VIDEO SWALLOW [place] [facility] THERAP[facility] Alphonzo Dublin E., SLP             12/13/2021  9:30 AM X-RAY SWALLOWING FUNCTION CINE/VIDEO [place] [facility] [place] DIAGNOSTIC ROOM 4     Appointment Notes:     SPEECH             12/17/2021  9:00 AM LVL 3 - 2 [place] INFUSION, 4TH FLOOR    Appointment Notes:     IVIG 25G Q 4weeks Pt wants 4th Fl only             12/18/2021  3:20 PM [facility] [place], [place] Ginny Forth, MD    Appointment Notes:     Annual Medicare Wellness Visit             12/19/2021  9:30 AM RETURN PULM GEN [place] [facility] PULMONARY GENERAL Katheren Shams, MD    Appointment Notes:     4 months follow up TB/MAC  01/10/2022 10:00 AM [place] PFT LAB [email or web address]    Appointment Notes:     MAI (mycobacterium avium-intracellulare) (CMS-HCC) [S50.0]Cavitary lesion of lung [J98.4]Bronchiectasis without complication (CMS-HCC) [N39.7].             01/14/2022 10:00 AM [place] PFT LAB HCHPFT3    Appointment Notes:     MAI (mycobacterium avium-intracellulare) (CMS-HCC) [Q73.0]Cavitary lesion of lung [J98.4]Bronchiectasis without complication (CMS-HCC) [A19.3].             01/16/2022 10:00 AM [place] PFT LAB HCHPFT3    Appointment Notes:     MAI (mycobacterium avium-intracellulare) (CMS-HCC) [X90.0]Cavitary lesion of lung [J98.4]Bronchiectasis without complication (CMS-HCC) [W40.9].             01/30/2022 11:00 AM RETURN IMMUNODEFICIENCY [place] [facility] Maudie Mercury Bedelia Person, MD    Appointment Notes:     Immuno 6 mon follow up             02/28/2022  1:00 PM [place], Genevieve Newman    Appointment Notes:     Ototoxic Monitoring             05/30/2022  1:00 PM [place], Genevieve Newman    Appointment Notes:     Ototoxic Monitoring                        LABS required:  (Q year Potassium, Creatinine)    Lab Results   Component Value Date    NA 141 10/16/2021    K 4.2 10/16/2021    CL 105 10/16/2021     BICARB 27 10/16/2021    BUN 16 10/16/2021    CREAT 0.75 10/16/2021       Lab Results   Component Value Date    GFRNON >60 11/06/2020    EGFRCKDEPI >60 10/16/2021         Monitoring required:  (Q year BP) (pregnancy prn)  *If pt is pregnant, DEFER TO MD*    (BP range: BDZHGDJM=42-683  MHDQQIWLN=98-92)  Blood Pressure   10/22/21 136/74   10/16/21 127/68   10/16/21 124/66       Pulse Readings from Last 3 Encounters:   10/22/21 65   10/16/21 74   10/16/21 75         Last Digital Health Monitoring Vitals:        Last MyChart BP Values:        Last Pt Entered MyChart BP Values:

## 2021-11-01 ENCOUNTER — Encounter (HOSPITAL_BASED_OUTPATIENT_CLINIC_OR_DEPARTMENT_OTHER): Payer: Self-pay | Admitting: General Surgery

## 2021-11-01 ENCOUNTER — Encounter (INDEPENDENT_AMBULATORY_CARE_PROVIDER_SITE_OTHER): Payer: Self-pay | Admitting: Occupational Health

## 2021-11-01 ENCOUNTER — Ambulatory Visit: Payer: Medicare Other | Admitting: Occupational Health

## 2021-11-01 DIAGNOSIS — H2511 Age-related nuclear cataract, right eye: Secondary | ICD-10-CM

## 2021-11-01 MED ORDER — PREDNISOLONE ACETATE 1 % OP SUSP
OPHTHALMIC | 3 refills | Status: DC
Start: 2021-11-01 — End: 2022-02-12

## 2021-11-01 MED ORDER — KETOROLAC TROMETHAMINE 0.5 % OP SOLN
OPHTHALMIC | 3 refills | Status: DC
Start: 2021-11-01 — End: 2022-02-12

## 2021-11-01 MED ORDER — MOXIFLOXACIN HCL 0.5 % OP SOLN
OPHTHALMIC | 3 refills | Status: DC
Start: 2021-11-01 — End: 2022-03-25

## 2021-11-01 NOTE — H&P (Signed)
Last edited 11/01/21 1050 by Vela Prose, NP  Date of Service 10/31/21 1215  Status: Addendum             ANESTHESIA PRE-OPERATIVE EVALUATION    Patient Information    Name: Tiffany Chen    MRN: [id number]    DOB: 12-30-1945    Age: 76 year old    Sex: female  Procedure(s) with comments:  PHACOEMULSIFICATION, CATARACT, WITH IOL IMPLANT, RIGHT EYE - Right Eye then Left Eye      Pre-op Vitals:   There were no vitals taken for this visit.        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 Lives on second floor and walks hilly terrain. Sometimes need to stop and rest due to breathing.),  able to dress/bathe self,  has not fallen in the last 6 months,  This is a phone interview    Patient lives with by self - below her sister's home     Activity level: walks 1 mile daily, and 1-2 days up to 4 miles.  Up and down hills. Has walking sticks.  Climbing stairs to get in and out of the house. Independent with all ADLs    Stated Height: 5'2.5    Stated Weight:138 lbs     BMI: 26 based on stated height and weight    Cardiovascular:  no CAD/Angina/MI/CABG/Stents, Anti-platelet drugs: No,    hypertension,  no dysrhythmias,  No activity related chest pain or SOB. Denies palpitations.    HX: HLD and HTN    Activity level: walks 1 mile daily, and 1-2 days up to 4 miles.  Up and down hills. Has walking sticks.  Climbing stairs to get in and out of the house. Independent with all ADLs    Followed by [place] PCP   Tama Headings, MD  Last seen 6 months ago         EKG   08/23/2020: sinus rhythm with PAC, 64   Anesthesia History:  negative anesthesia history ROS  no history of  anesthetic complications,  no PONV,  no history of difficult IV access,  chronic pain patient (Colostomy with hernia, causes some pain),  no family history of anesthetic complications,   Pulmonary:   no asthma,  no home oxygen use,  no sleep apnea,  no recent URI/pneumonia,  RUL lobectomy with middle wedge resection 01/2021    History of MAC lung infection treated multiple times with azithromycin 552m, Rifampin 6039m, ethambutol 160042mhree times weekly: 2018- 01/19/2018, 05/25/2018- 10/22/2019 and 12/22/2019- 02/16/2020. Currently resumed all 3 medications.    Monthly IVIG infusions started 01/16/21 to help with MAC infection tx    Followed by [facility]  Last seen 10/16/21    Has chronic mild cough that is stable. Occasional dyspnea with moderate exertion -stable    Changed diet, dropped weight, sleeping propped up has improved her symptoms.    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     Pulmonary Function Test:  04/28/2020 (care everywhere): noraml spito, normal volumes, mildly reduced DLCO     Neuro/Psych:   TIA/CVA,  no seizures,  psychiatric history (Prozac: depression - controlled),  H/o brain aneurysm ICH S/P coiling 12/2019 Hematology/Oncology:   hematologic/lymphatic negative  history of cancer (Skin BCC and SCC with excisions),  no chemotherapy,  no radiation treatment,      GI/Hepatic:  GERD,  no liver disease,  no pancreatic disease,  no bowel obstruction,  Ruptured diverticulitis S/P colostomy (current) in 2009    Colostomy with hernia, causes some pain  Having takedown sx 01/2022 Infectious Disease:  negative for infectious disease  no hepatitis,  no tuberculosis,  See pulmonary    Previous COVID-19 dx: denies            Renal:  negative renal ROS  10/16/21 14:10  Creatinine: 0.75  eGFR Based on CKD-EPI 2021 Equation: >60 Endocrine/Other:  diabetes,    no arthritis (Neck -  no pain),   no back pain (Able to lie flat),   DM2: Metformin   FSB:  does not check   06/26/21 10:43  Glycated Hemoglobin: 6.8 (H)       Pregnancy History:   Pediatrics:         Pre Anesthesia Testing (PCC/CPC) notes/comments:    [facility] Test & records reviewed by [facility] Provider.                         Patient to be driven home/assisted by sister, Stanton Kidney   Pre-op instructions given verbally and via my chart                  Physical Exam    Airway:      Cardiovascular:      Pulmonary:      Neuro/Neck/Skeletal/Skin:      Dental:      Abdominal:             Additional Clinical Notes:   Other Physical Exam Findings: No physical exam. This is a phone interview.          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     Past Surgical History:   Procedure Laterality Date    BRONCHOSCOPY  12/2020    COLOSTOMY      LUNG LOBECTOMY       Social History     Socioeconomic History    Marital status: Widowed   Tobacco Use    Smoking status: Never    Smokeless tobacco: Never   Substance and Sexual Activity  Alcohol use: Not Currently    Drug use: Not Currently    Sexual activity: Not  Currently   Other Topics Concern    Military Service No    Blood Transfusions No    Caffeine Concern No    Occupational Exposure No    Hobby Hazards Yes     Comment: Gardening, MAC    Sleep Concern No    Stress Concern Yes     Comment: Husband death Cov19, 2020, sold home, rehomed pets. [place]    Weight Concern No    Special Diet Yes    Back Care No    Exercises Regularly Yes    Bike Helmet Use No    Seat Belt Use No    Performs Self-Exams No   Social History Narrative    Lives with brother Jeneen Rinks at home. Sister also lives in [place] Determinants of Health      Exercise is Medicine (EIM)     Alcohol Use: Not on file       Current Outpatient Medications   Medication Sig Dispense Refill    ARIKAYCE 590 MG/8.4ML SUSP Inhale the contents of one vial (538m) via Lamira device 1 time daily as directed. 28 each 5    atorvastatin (LIPITOR) 10 MG tablet Take 1 tablet (10 mg) by mouth daily.      azithromycin (ZITHROMAX) 250 MG tablet TAKE 1 TABLET BY MOUTH  DAILY 60 tablet 11    CLOFAZIMINE IRB 21-0063 50 MG CAPSULE Take 2 capsules (1067m by mouth once daily with food as directed. 200 capsule 0    CLOFAZIMINE IRB 21-0063 50 MG CAPSULE Take 2 capsules (10029mby mouth once daily with food as directed. 200 capsule 0    ethambutol (MYAMBUTOL) 400 MG tablet TAKE 2 TABLETS BY MOUTH  DAILY 120 tablet 11    ferrous sulfate 325 (65 Fe) MG tablet Take 1 tablet (325 mg) by mouth 3 times daily.      fluoxetine (PROZAC) 40 MG capsule Take 1 capsule (40 mg) by mouth daily. 90 capsule 3    metFORMIN (GLUCOPHAGE) 500 mg tablet Take 1 tablet (500 mg) by mouth 2 times daily (with meals).      neomycin 500 MG tablet Take 2 tablets (1000 mg) by mouth at noon and 2 tablets (1000 mg) at 8 PM on the day prior to surgery. 4 tablet 0    omeprazole (PRILOSEC) 20 MG capsule TAKE 1 CAPSULE BY MOUTH IN  THE MORNING BEFORE  BREAKFAST 90 capsule 3    Pediatric Multivitamins-Fl (MULTIVITAMINS/FL PO)       ramipril (ALTACE) 2.5 MG capsule  Take 1 capsule (2.5 mg) by mouth daily. 90 capsule 1    rifAMPin (RIFADIN) 300 MG capsule TAKE 2 CAPSULES BY MOUTH  DAILY 120 capsule 11    sodium chloride 7 % NEBU USE 1 VIAL VIA NEBULIZER  EVERY 12 HOURS 720 mL 3    VENTOLIN HFA 108 (90 Base) MCG/ACT inhaler USE 2 INHALATIONS BY MOUTH  EVERY 6 HOURS AS NEEDED FOR WHEEZING 72 g 3     Current Facility-Administered Medications   Medication Dose Route Frequency Provider Last Rate Last Admin    lidocaine (XYLOCAINE) 2 % viscous solution 1 mL  1 mL Other PRN Broderick, RyaEthelle LyonD        lidocaine (XYLOCAINE) 4 % topical   Other PRN BroHillery HunteryaEthelle LyonD         Allergies  Allergen Reactions    [facility] Flavor Other       Labs and Other Data  Lab Results   Component Value Date    NA 141 10/16/2021    K 4.2 10/16/2021    CL 105 10/16/2021    BICARB 27 10/16/2021    BUN 16 10/16/2021    CREAT 0.75 10/16/2021    GLU 104 (H) 10/16/2021    [place] 9.2 10/16/2021     Lab Results   Component Value Date    AST 26 10/16/2021    ALT 22 10/16/2021    ALK 56 10/16/2021    TP 6.7 10/16/2021    ALB 3.9 10/16/2021    TBILI 0.28 10/16/2021    DBILI <0.2 07/17/2020     Lab Results   Component Value Date    WBC 5.0 10/16/2021    RBC 4.00 10/16/2021    HGB 12.2 10/16/2021    HCT 36.6 10/16/2021    MCV 91.5 10/16/2021    MCHC 33.3 10/16/2021    RDW 14.4 (H) 10/16/2021    PLT 238 10/16/2021    MPV 10.8 10/16/2021    SEG 53 10/16/2021    LYMPHS 31 10/16/2021    MONOS 11 10/16/2021    EOS 4 10/16/2021    BASOS 1 10/16/2021     No results found for: INR, PTT  No results found for: ARTPH, ARTPO2, ARTPCO2    Anesthesia Plan:  Comments: (NP note dated 11/01/21  Pre-op instructions given verbally and via my chart   Hold Altace and Metformin the am of sx   )           Revision History         Date/Time User Provider Type Action    > 11/01/2021 10:50 AM Filomena Pokorney, Bernarda Caffey, NP Nurse Practitioner Addend     10/31/2021 12:21 PM Deangela Randleman, Bernarda Caffey, NP Nurse Practitioner Sign

## 2021-11-02 ENCOUNTER — Encounter (HOSPITAL_BASED_OUTPATIENT_CLINIC_OR_DEPARTMENT_OTHER): Payer: Self-pay | Admitting: General Surgery

## 2021-11-05 NOTE — Discharge Instructions (Signed)
AFTER YOUR CATARACT SURGERY    Follow Up :   FRIDAY 11/09/21   8:15 AM  -- [facility] (1ST FLOOR)  --  DR. Alfonse Spruce        Patient Information    This handout will help you make your recovery from cataract surgery more comfortable. Before leaving, you will be given a follow-up appointment either at the [facility] or at the [facility]. An appointment card will be given to you as a reminder.     If you have any questions or concerns that cannot wait until your first visit, please call the [facility] at [facility] or the [facility]. If the clinic is closed, call the [place] Page Operator at [phone] and ask to speak to the Ophthalmologist (eye surgeon) on-call.     Call your doctor if:     You have a deep aching pain in the eye that is not relieved by Tylenol, especially if you also have nausea (upset stomach).   You have a sudden increase in redness and pus drainage from the eye.   Your vision gets worse after surgery, you see flashing lights, or a black curtain appears to come down over the vision in your eye.     First night:     ?    Wear the shield today. You may remove the shield to put eye drops in your eye, then re-tape the shield.    Please follow the attached eye drop instruction list.         1. PREDNISOLONE: 1 drop FOUR TIMES A DAY IN SURGICAL EYE      2. MOXIFLOXACIN: 1 drop FOUR TIMES A DAY IN SURGICAL EYE       3. KETOROLAC: 1 drop TWO TIMES A DAY IN THE SURGICAL EYE        BRING ALL YOUR EYE DROPS TO YOUR EYE APPOINTMENTS.      Eye pain:     Mild soreness and a scratchy feeling in your eye can be expected.   Only take Tylenol (acetaminophen) for pain unless other pain medicines have been specifically prescribed.   Do not take any pain relievers (pain pills) that have aspirin for one week after surgery.   If you have a deep, aching pain in the eye that is not relieved by Tylenol (especially if you also have an upset stomach), call your surgeon right away.     Diet:    You  may return to your normal diet the day of surgery.      Redness:    Your eye will be red right after surgery. There should be less redness every day.   If you have an increase in redness or pus draining from the eye, call your surgeon right away.     Vision:     Your vision may be blurry right after surgery. It should get better with time.   Your best vision will not be until 6 to 8 weeks after surgery. At this time you will be ordered a new pair of glasses.   If at any time your vision gets worse, you see flashing lights, or a black curtain appears to come down over the vision in your eye, call your surgeon right away.     Safety:     For the first week after surgery, tape the shield over the eye while sleeping.   Wear eye protection at all times for the first 3  weeks (21 days). Any eye glasses or sunglasses will work.     Activity:     Do not bend, stoop, or strain for the first week after surgery.   Do not lift anything heavier than a gallon of milk until your doctor tells you it is okay.   Never rub your eye.  If you have a cough, take cough medicine.   If you are constipated, take a stool softener.   You may start all normal activities as long as they do not involve jerking of the head.   No high-intensity activity like jogging, aerobics, or sex, for 14 days after surgery.   You may shower and wash your hair, but do not get soap or shampoo in your eye.   For the first week (7 days) after surgery, keep the operated eye facing up when you sleep or lie down. Do not lay the eye on the pillow or mattress.     Important phone numbers:     [facility]   [phone]   Clinic hours are 8:00 AM to 4:30 PM,    Monday through Friday.       [place]     [phone]     [facility]      [phone]    Clinic hours are 8:00 AM to noon on Mondays.    Clinic hours are 8:00 AM to 4:30 PM Tuesday through Friday.         [place] [phone]   Ask to speak  to the ophthalmologist (eye surgeon) on call.

## 2021-11-06 ENCOUNTER — Other Ambulatory Visit (INDEPENDENT_AMBULATORY_CARE_PROVIDER_SITE_OTHER): Payer: Self-pay

## 2021-11-06 DIAGNOSIS — E785 Hyperlipidemia, unspecified: Secondary | ICD-10-CM

## 2021-11-06 NOTE — Telephone Encounter (Signed)
Pt returning call re: previous msg and scheduled first available appt 12/22.

## 2021-11-06 NOTE — Telephone Encounter (Signed)
Rx last entered as historical med          [facility], CPhT  (Rx Refill and [facility])      Statin Refill Protocol    Last visit in enc specialty: 04/23/2021     Recent Visits in This Encounter Department       Date Provider Department Visit Type Primary Dx    04/23/2021 Shellia Cleverly, MD [place] [facility] Visit Type 2 diabetes mellitus without complication, without long-term current use of insulin (CMS-HCC)           Population Health Visits  Recent [facility] Visits    None       Next f/u appt due:  Return in about 3 months (around 07/23/2021) for Medicare Annual Wellness visit.   Next appt in enc specialty: 12/18/2021      Future Appointments 11/06/2021 - 11/05/2026        Date Visit Type Department Provider     11/09/2021  8:45 AM POST OP [place] Gilda Crease, MD    Appointment Notes:     1 DAY PO             11/15/2021 10:15 AM AUDIOGRAM [place] [facility]IOLOGY Barbaraann Share    Appointment Notes:     Ototoxic Monitoring             11/19/2021  9:00 AM LVL 3 - 2 [place] INFUSION, 4TH FLOOR    Appointment Notes:     IVIG 25G Q 4weeks Pt wants 4th Fl only             11/23/2021  8:45 AM POST OP OPHTHALMOLOGY [place] [facility] Gilda Crease, MD    Appointment Notes:     1 DAY PO             12/13/2021  9:00 AM ST VIDEO SWALLOW [place] [facility] Fredrich Romans., SLP             12/13/2021  9:30 AM X-RAY SWALLOWING FUNCTION CINE/VIDEO [place] [facility] [place] DIAGNOSTIC ROOM 4    Appointment Notes:     SPEECH             12/17/2021  9:00 AM LVL 3 - 2 [place] INFUSION, 4TH FLOOR    Appointment Notes:     IVIG 25G Q 4weeks Pt wants 4th Fl only             12/18/2021  3:20 PM [facility] [place] Ginny Forth, MD    Appointment Notes:     Annual Medicare  Wellness Visit             12/19/2021  9:30 AM RETURN PULM GEN [place] [facility] Katheren Shams, MD    Appointment Notes:     4 months follow up TB/MAC             12/28/2021  2:40 PM RET MIN INV SURG  [facility] Diona Browner, MD    Appointment Notes:     follow up             01/10/2022 10:00 AM [place] PFT LAB [id number]    Appointment Notes:     MAI (mycobacterium avium-intracellulare) ([place]) Ran.[place]une.0]Cavitary lesion of  lung Q9708719.4]Bronchiectasis without complication (CMS-HCC) [B28.4].             01/14/2022 10:00 AM [place] PFT LAB HCHPFT3    Appointment Notes:     MAI (mycobacterium avium-intracellulare) (CMS-HCC) [X32.0]Cavitary lesion of lung [J98.4]Bronchiectasis without complication (CMS-HCC) [G40.1].             01/16/2022 10:00 AM [place] PFT LAB HCHPFT3    Appointment Notes:     MAI (mycobacterium avium-intracellulare) (CMS-HCC) [U27.0]Cavitary lesion of lung [J98.4]Bronchiectasis without complication (CMS-HCC) [O53.6].             01/30/2022 11:00 AM RETURN IMMUNODEFICIENCY [place] [facility] Maudie Mercury Bedelia Person, MD    Appointment Notes:     Immuno 6 mon follow up             02/28/2022  1:00 PM AUDIOGRAM [place], Genevieve Newman    Appointment Notes:     Ototoxic Monitoring             05/30/2022  1:00 PM AUDIOGRAM [facility] AUDIOLOGY Barbaraann Share    Appointment Notes:     Ototoxic Monitoring                      Per OV  04/23/21  A/P- T2DM  - Currently on Metformin 500 mg BID (decreased from 1000 mg BID 11/2020)  - On Ramipril for kidney protection  - On Atorvastatin  - No dx HTN/HLD  Screening for lipoid disorders  -     Lipid Panel Green Plasma Separator Tube; Future        Per CareEverywhere 11/01/21: ([place] [facility])          LABS required:  (Q year Lipid Panel, ALT (baseline only))    Lab Results    Component Value Date    CHOL 163 05/16/2021    TRIG 145 05/16/2021    HDL 53 05/16/2021    NHDLV 110 05/16/2021    LDLCALC 81 05/16/2021        Lab Results   Component Value Date    ALT 22 10/16/2021     Per Labs 05/16/21      Monitoring required:  (None)

## 2021-11-07 ENCOUNTER — Encounter (HOSPITAL_COMMUNITY): Payer: Self-pay

## 2021-11-07 MED ORDER — ATORVASTATIN CALCIUM 10 MG OR TABS
10.0000 mg | ORAL_TABLET | Freq: Every day | ORAL | 2 refills | Status: DC
Start: 2021-11-07 — End: 2022-07-09

## 2021-11-08 ENCOUNTER — Encounter (HOSPITAL_BASED_OUTPATIENT_CLINIC_OR_DEPARTMENT_OTHER): Payer: Self-pay | Admitting: Ophthalmology

## 2021-11-08 ENCOUNTER — Ambulatory Visit (HOSPITAL_BASED_OUTPATIENT_CLINIC_OR_DEPARTMENT_OTHER): Payer: Medicare Other | Admitting: Anesthesiology

## 2021-11-08 ENCOUNTER — Ambulatory Visit (HOSPITAL_BASED_OUTPATIENT_CLINIC_OR_DEPARTMENT_OTHER): Payer: Medicare Other | Admitting: Occupational Health

## 2021-11-08 ENCOUNTER — Encounter (HOSPITAL_COMMUNITY): Payer: Self-pay

## 2021-11-08 ENCOUNTER — Encounter (HOSPITAL_BASED_OUTPATIENT_CLINIC_OR_DEPARTMENT_OTHER): Admission: RE | Disposition: A | Discharge status: Home Health | Payer: Self-pay | Attending: Ophthalmology

## 2021-11-08 ENCOUNTER — Ambulatory Visit
Admission: RE | Admit: 2021-11-08 | Discharge: 2021-11-08 | Disposition: A | Discharge status: Home / Self Care | Payer: Medicare Other | Attending: Ophthalmology | Admitting: Ophthalmology

## 2021-11-08 DIAGNOSIS — Z85828 Personal history of other malignant neoplasm of skin: Secondary | ICD-10-CM

## 2021-11-08 DIAGNOSIS — E785 Hyperlipidemia, unspecified: Secondary | ICD-10-CM | POA: Insufficient documentation

## 2021-11-08 DIAGNOSIS — K219 Gastro-esophageal reflux disease without esophagitis: Secondary | ICD-10-CM | POA: Insufficient documentation

## 2021-11-08 DIAGNOSIS — E1136 Type 2 diabetes mellitus with diabetic cataract: Secondary | ICD-10-CM | POA: Insufficient documentation

## 2021-11-08 DIAGNOSIS — Z8673 Personal history of transient ischemic attack (TIA), and cerebral infarction without residual deficits: Secondary | ICD-10-CM

## 2021-11-08 DIAGNOSIS — Z902 Acquired absence of lung [part of]: Secondary | ICD-10-CM | POA: Insufficient documentation

## 2021-11-08 DIAGNOSIS — I10 Essential (primary) hypertension: Secondary | ICD-10-CM | POA: Insufficient documentation

## 2021-11-08 DIAGNOSIS — F32A Depression, unspecified: Secondary | ICD-10-CM | POA: Insufficient documentation

## 2021-11-08 DIAGNOSIS — Z8719 Personal history of other diseases of the digestive system: Secondary | ICD-10-CM | POA: Insufficient documentation

## 2021-11-08 DIAGNOSIS — H2511 Age-related nuclear cataract, right eye: Secondary | ICD-10-CM | POA: Diagnosis present

## 2021-11-08 DIAGNOSIS — G8929 Other chronic pain: Secondary | ICD-10-CM

## 2021-11-08 DIAGNOSIS — H2513 Age-related nuclear cataract, bilateral: Secondary | ICD-10-CM | POA: Insufficient documentation

## 2021-11-08 LAB — GLUCOSE POCT, BLOOD
Glucose (POCT): 121 mg/dL — ABNORMAL HIGH (ref 70–99)
Glucose (POCT): 129 mg/dL — ABNORMAL HIGH (ref 70–99)

## 2021-11-08 SURGERY — PHACOEMULSIFICATION, CATARACT, WITH IOL IMPLANT
Anesthesia: Monitored Anesthesia Care (MAC) | Site: Eye | Laterality: Right | Wound class: Class I (Clean)

## 2021-11-08 MED ORDER — DIPHENHYDRAMINE HCL 50 MG/ML IJ SOLN
12.5000 mg | Freq: Once | INTRAMUSCULAR | Status: DC | PRN
Start: 2021-11-08 — End: 2021-11-08
  Filled 2021-11-08: qty 0.25

## 2021-11-08 MED ORDER — PROPARACAINE HCL 0.5 % OP SOLN
OPHTHALMIC | Status: AC
Start: 2021-11-08 — End: 2021-11-08
  Filled 2021-11-08: qty 300

## 2021-11-08 MED ORDER — METOCLOPRAMIDE HCL 5 MG/ML IJ SOLN
10.0000 mg | Freq: Once | INTRAMUSCULAR | Status: DC | PRN
Start: 2021-11-08 — End: 2021-11-08
  Filled 2021-11-08: qty 2

## 2021-11-08 MED ORDER — LIDOCAINE HCL (PF) 1 % IJ SOLN
INTRAMUSCULAR | Status: DC | PRN
Start: 2021-11-08 — End: 2021-11-08
  Administered 2021-11-08: 1 mL via INTRACAMERAL

## 2021-11-08 MED ORDER — NA HYALUR & NA CHOND-NA HYALUR 0.55-0.5 ML IO KIT
PACK | INTRAOCULAR | Status: DC | PRN
Start: 2021-11-08 — End: 2021-11-08
  Administered 2021-11-08: 2 via INTRAOCULAR

## 2021-11-08 MED ORDER — FENTANYL CITRATE (PF) 250 MCG/5ML IJ SOLN
INTRAMUSCULAR | Status: DC | PRN
Start: 2021-11-08 — End: 2021-11-08
  Administered 2021-11-08 (×2): 25 ug via INTRAVENOUS
  Administered 2021-11-08: 50 ug via INTRAVENOUS

## 2021-11-08 MED ORDER — ONDANSETRON HCL 4 MG/2ML IV SOLN
INTRAMUSCULAR | Status: DC | PRN
Start: 2021-11-08 — End: 2021-11-08
  Administered 2021-11-08: 7 mg via INTRAVENOUS

## 2021-11-08 MED ORDER — ACETAMINOPHEN 325 MG PO TABS
650.0000 mg | ORAL_TABLET | Freq: Once | ORAL | Status: DC | PRN
Start: 2021-11-08 — End: 2021-11-08
  Filled 2021-11-08: qty 2

## 2021-11-08 MED ORDER — NALOXONE HCL 0.4 MG/ML IJ SOLN
20.0000 ug | INTRAMUSCULAR | Status: DC | PRN
Start: 2021-11-08 — End: 2021-11-08
  Filled 2021-11-08: qty 0.05

## 2021-11-08 MED ORDER — LACTATED RINGERS IV SOLN
INTRAVENOUS | Status: DC | PRN
Start: 2021-11-08 — End: 2021-11-08

## 2021-11-08 MED ORDER — MOXIFLOXACIN HCL 5 MG/ML IO SOLN
INTRAOCULAR | Status: DC | PRN
Start: 2021-11-08 — End: 2021-11-08
  Administered 2021-11-08: .1 mL via INTRACAMERAL

## 2021-11-08 MED ORDER — MIDAZOLAM HCL 2 MG/2ML IJ SOLN
INTRAMUSCULAR | Status: AC
Start: 2021-11-08 — End: 2021-11-08
  Filled 2021-11-08: qty 2

## 2021-11-08 MED ORDER — TOBRAMYCIN-DEXAMETHASONE 0.3-0.1 % OP SUSP
OPHTHALMIC | Status: DC | PRN
Start: 2021-11-08 — End: 2021-11-08
  Administered 2021-11-08: 1 [drp] via OPHTHALMIC

## 2021-11-08 MED ORDER — TROPICAMIDE-CYCLOPENTOLATE-PE 1-1-2.5 % OP SOLN
OPHTHALMIC | Status: AC
Start: 2021-11-08 — End: 2021-11-08
  Filled 2021-11-08: qty 1

## 2021-11-08 MED ORDER — APRACLONIDINE HCL 1 % OP SOLN
OPHTHALMIC | Status: DC | PRN
Start: 2021-11-08 — End: 2021-11-08
  Administered 2021-11-08: 1 [drp] via OPHTHALMIC

## 2021-11-08 MED ORDER — LACTATED RINGERS IV SOLN
INTRAVENOUS | Status: DC
Start: 2021-11-08 — End: 2021-11-08

## 2021-11-08 MED ORDER — PROPARACAINE HCL 0.5 % OP SOLN
1.0000 [drp] | OPHTHALMIC | Status: AC
Start: 2021-11-08 — End: 2021-11-08
  Administered 2021-11-08 (×3): 1 [drp] via OPHTHALMIC
  Filled 2021-11-08: qty 1

## 2021-11-08 MED ORDER — CARBACHOL 0.01 % IO SOLN
INTRAOCULAR | Status: DC | PRN
Start: 2021-11-08 — End: 2021-11-08
  Administered 2021-11-08: .5 mL via INTRAOCULAR

## 2021-11-08 MED ORDER — TETRACAINE HCL 0.5 % OP SOLN
OPHTHALMIC | Status: DC | PRN
Start: 2021-11-08 — End: 2021-11-08
  Administered 2021-11-08: 2 [drp] via TOPICAL

## 2021-11-08 MED ORDER — BALANCED SALT IO SOLN
INTRAOCULAR | Status: DC | PRN
Start: 2021-11-08 — End: 2021-11-08
  Administered 2021-11-08: 500 mL

## 2021-11-08 MED ORDER — FENTANYL CITRATE (PF) 100 MCG/2ML IJ SOLN
INTRAMUSCULAR | Status: AC
Start: 2021-11-08 — End: 2021-11-08
  Filled 2021-11-08: qty 2

## 2021-11-08 MED ORDER — FAMOTIDINE (PF) 20 MG/2ML IV SOLN
INTRAVENOUS | Status: DC | PRN
Start: 2021-11-08 — End: 2021-11-08
  Administered 2021-11-08: 20 mg via INTRAVENOUS

## 2021-11-08 MED ORDER — TROPICAMIDE-CYCLOPENTOLATE-PE 1-1-2.5 % OP SOLN
1.0000 [drp] | OPHTHALMIC | Status: AC
Start: 2021-11-08 — End: 2021-11-08
  Administered 2021-11-08 (×3): 1 [drp] via OPHTHALMIC
  Filled 2021-11-08: qty 0.5

## 2021-11-08 MED ORDER — FENTANYL CITRATE (PF) 100 MCG/2ML IJ SOLN
12.5000 ug | INTRAMUSCULAR | Status: DC | PRN
Start: 2021-11-08 — End: 2021-11-08
  Filled 2021-11-08: qty 0.25

## 2021-11-08 MED ORDER — BSS IO SOLN
INTRAOCULAR | Status: DC | PRN
Start: 2021-11-08 — End: 2021-11-08
  Administered 2021-11-08: 15 mL via TOPICAL

## 2021-11-08 MED ORDER — MIDAZOLAM HCL 2 MG/2ML IJ SOLN
INTRAMUSCULAR | Status: DC | PRN
Start: 2021-11-08 — End: 2021-11-08
  Administered 2021-11-08 (×2): .5 mg via INTRAVENOUS
  Administered 2021-11-08: 1 mg via INTRAVENOUS

## 2021-11-08 SURGICAL SUPPLY — 17 items
CANNULA ANTERIOR CHAMBER 27GA X 3/8", 3MM ANG TIP (Needles/punch/cannula/biopsy) ×1
CANNULA ANTERIOR CHAMBER 30GA (Needles/punch/cannula/biopsy) ×1
CANNULA HYDRODISSECTION 25GA X 1", 7MM FLAT TIP (Needles/punch/cannula/biopsy) ×1
CANNULA OPTHALMIC HYDRODISSECTION 27GA X 7MM (Needles/punch/cannula/biopsy) ×1
CANNULA WASHOUT 19G (Needles/punch/cannula/biopsy) ×1
DRESSING TEGADERM HP 2.375" X 2.375" (Dressings/packing) ×1
GLOVE BIOGEL SUPER-SENSITIVE SIZE 6.5 (Gloves/Gowns) ×1 IMPLANT
GOWN SURGICAL ULTRA XL BLUE, AAMI LVL 3 (Gloves/Gowns) IMPLANT
KNIFE SIDEPORT CLEAR CUT 1.0MM DUAL BEVEL ANGLED (Knives/Blades) ×1
LENS INTRAOC 22.5D (Lens) ×1 IMPLANT
LENS INTRAOC SY60WF.225 (Lens) ×1 IMPLANT
LENS KONTUR DIAMETER 16 BASE CURVE 9.8 (Uncategorized Supply) ×1 IMPLANT
PACK CATARACT SISTER TRANSFORMER (SHILEY PHACO PACK) (Procedure Packs/kits) ×1 IMPLANT
SLEEVE ANTERIOR VITRECTOMY (Misc Medical Supply)
SOLUTION IRR POUR BTL H20 250ML (Non-Pharmacy Meds/Solutions) ×1
STOPCOCK 33 EXT LEURLOCK (Kits/Sets/Trays) ×1
SUTURE NYLON 10-0 12" CU-5 192101 (Suture)

## 2021-11-08 NOTE — H&P (Signed)
Attending Attestation of H&P:    I agree with the content of the H&P as stated: yes  I have corrections or additions as indicated below: no    History & Physical - Interval Assessment:    Kathrynne Kulinski  [id number]    Current medical status:  Unchanged    Medications/allergies:   Unchanged    Review of symptoms:  Unchanged    Physical examination:  I have examined the patient today.  Unchanged    Laboratory or clinical data:  Unchanged    Modifications of initial care plan:  Unchanged    No interval change from preoperative evaluation with  Isaiah Blakes, NP on 11/01/21.    Burdell Peed Alda Lea 11/08/21  8:25 AM

## 2021-11-08 NOTE — Anesthesia Postprocedure Evaluation (Signed)
Anesthesia Post Note    Patient: Tiffany Chen    Procedure(s) Performed: Procedure(s) with comments:  PHACOEMULSIFICATION, CATARACT, WITH IOL IMPLANT, RIGHT EYE - Right Eye then Left Eye      Final anesthesia type: Monitored Anesthesia Care    Patient location: PACU    Post anesthesia pain: adequate analgesia    Mental status: awake, alert , and oriented    Airway Patent: Yes    Last Vitals:   Vitals Value Taken Time   BP 136/65 11/08/21 0917   Temp 97.8 11/08/21 0919   Pulse 68 11/08/21 0919   Resp 21 11/08/21 0919   SpO2 97 % 11/08/21 0919   Vitals shown include unvalidated device data.     Post vital signs: stable    Hydration: adequate    N/V:no    Anesthetic complications: no  BS 387.  0/10 pain and No N/V.    Plan of care per primary team.

## 2021-11-08 NOTE — Op Note (Signed)
Dictating Practitioner: Derry Lory, M.D.    Staff Physician: Derry Lory, M.D.    Date of Operation: 11/08/21     PREOPERATIVE DIAGNOSIS: 1) Visually significant nuclear sclerotic cataract, right eye.    POSTOPERATIVE DIAGNOSIS: 1) Visually significant nuclear sclerotic cataract, right eye.  PROCEDURE: 1) Cataract extraction by phacoemulsification with intraocular lens implant, right eye.     SURGEON/STAFF: Derry Lory, MD    ASSISTANT: N/A  INTRAOCULAR LENS DATA:  MODEL: [id number]   POWER:  +22.5 D     SN [id number].  ANESTHESIA: Monitored anesthesia care with topical.      INDICATIONS: This patient is a 76 year old female  with visually significant cataract in the right eye with symptoms of decreased vision which could not be sufficiently improved by other means. This has impacted the patient's activities of daily living (ADLs) including difficulty with reading, seeing captions on television and sewing. Patient reports she is unable to drive at night and color perception is affected. The patient presents to the [facility] for definitive treatment of this condition. The benefits,  a reasonable expectation for visual improvement, alternative of not performing surgery, and risks including but not limited to infection, bleeding, loss of vision, retinal detachment, increased intraocular pressure, corneal decompensation, and the possible need for further surgery or laser were discussed in detail with the patient. All the patient's questions were answered, and the patient consented to cataract extraction with lens implant to the right eye.     DESCRIPTION OF PROCEDURE: After routine preoperative clearance, the patient was identified in the preoperative holding area and the right eye was marked as the correct surgical eye. The patient was then brought to the main operating room and placed under intravenous sedation with monitored anesthesia care by the Anesthesia Service. The eye was prepped and draped in the  usual sterile ophthalmic fashion.     A wire lid speculum was placed between the patient's eyelids,  a temporal clear corneal biplanar incision was created  using a 2.4 mm keratome followed by 1% preservative free lidocaine. Next, the anterior chamber was formed using viscoelastic and a side-port incision was made. A continuous curvilinear capsulorrhexis was created using a bent cystitome and Utrata forceps. Hydrodissection was achieved using balanced saline solution on a cannula, and the cataract was removed by phacoemulsification using a divide and conquer technique. Irrigation and aspiration was used to remove remaining cortical material, and the capsular bag was filled using viscoelastic. The intraocular lens was injected into the capsular bag and centered. The remaining viscoelastic was removed using irrigation and aspiration and Miostat was instilled to constrict the pupil. Moxifloxacin 0.1 cc was instilled into the anterior chamber. The corneal wounds were hydrated, tested, and deemed to be entirely watertight. Towards the end of the case, there was some bullous edema in the inferior cornea, a bandage contact lens was then placed on the right eye. TobraDex and Iopidine ophthalmic drops were instilled. The lid speculum was removed and a clear Fox shield placed over the patient's eye.     The patient tolerated the procedure well, and there were no complications. The patient was transferred to the recovery room in stable condition. Dr. Alfonse Spruce was present for and performed all aspects of this case.

## 2021-11-08 NOTE — Brief Op Note (Signed)
OPHTHALMOLOGY BRIEF OPERATIVE NOTE    PREOPERATIVE DIAGNOSIS:  Nuclear sclerosis cataract, right eye.      POSTOPERATIVE DIAGNOSIS: Nuclear sclerosis cataract, right eye.     PROCEDURE: Phacoemulsification and intraocular lens placement, right eye.   Lens Model:   [id number]     Power:  +22.5 D    SN: [id number]    ATTENDING SURGEON: Derry Lory, MD    ASSISTANTS(s): N/A    ANESTHESIA: MAC    COMPLICATIONS:  None    WOUND CLASSIFICATION STATUS: Class 1     WOUND CLOSURE STATUS: Class 1     SPECIMENS:  None    DISPOSITION: Home

## 2021-11-09 ENCOUNTER — Ambulatory Visit (INDEPENDENT_AMBULATORY_CARE_PROVIDER_SITE_OTHER): Payer: Medicare Other | Admitting: Ophthalmology

## 2021-11-09 ENCOUNTER — Encounter (HOSPITAL_COMMUNITY): Payer: Self-pay

## 2021-11-09 DIAGNOSIS — Z9841 Cataract extraction status, right eye: Secondary | ICD-10-CM

## 2021-11-09 DIAGNOSIS — Z09 Encounter for follow-up examination after completed treatment for conditions other than malignant neoplasm: Secondary | ICD-10-CM

## 2021-11-09 DIAGNOSIS — Z961 Presence of intraocular lens: Secondary | ICD-10-CM

## 2021-11-09 DIAGNOSIS — Z9889 Other specified postprocedural states: Secondary | ICD-10-CM

## 2021-11-09 NOTE — Assessment & Plan Note (Addendum)
POD#1: CEIOL OD (11/08/21)  Pred Forte & Moxifloxacin QID OD.  Ketorolac BID OD.  +epithelial defect: Bandage CL (Oasis Acuvue  8.4) placed OD.  Postop instructions reviewed, written medication schedule given.  FU next week (11/12/21).

## 2021-11-09 NOTE — Progress Notes (Signed)
Status post cataract extraction and insertion of intraocular lens of right eye  Assessment & Plan:  POD#1: CEIOL OD (11/08/21)  Pred Forte & Moxifloxacin QID OD.  Ketorolac BID OD.  +epithelial defect: Bandage CL (Oasis Acuvue  8.4) placed OD.  Postop instructions reviewed, written medication schedule given.  FU next week (11/12/21).

## 2021-11-12 ENCOUNTER — Ambulatory Visit (INDEPENDENT_AMBULATORY_CARE_PROVIDER_SITE_OTHER): Payer: Medicare Other | Admitting: Ophthalmology

## 2021-11-12 DIAGNOSIS — Z961 Presence of intraocular lens: Secondary | ICD-10-CM

## 2021-11-12 DIAGNOSIS — Z9841 Cataract extraction status, right eye: Secondary | ICD-10-CM

## 2021-11-12 DIAGNOSIS — Z09 Encounter for follow-up examination after completed treatment for conditions other than malignant neoplasm: Secondary | ICD-10-CM

## 2021-11-12 NOTE — Assessment & Plan Note (Signed)
POD#4: CEIOL OD (11/08/21)  Resolving corneal epithelial defect.  Bandage CL removed.  Pred Forte & Moxifloxacin QID OD.  Ketorolac BID OD.  Continue post op restrictions.    Pending CEIOL OS 11/22/21.

## 2021-11-12 NOTE — Progress Notes (Signed)
Status post cataract extraction and insertion of intraocular lens of right eye  Assessment & Plan:  POD#4: CEIOL OD (11/08/21)  Resolving corneal epithelial defect.  Bandage CL removed.  Pred Forte & Moxifloxacin QID OD.  Ketorolac BID OD.  Continue post op restrictions.    Pending CEIOL OS 11/22/21.          No follow-ups on file.

## 2021-11-15 ENCOUNTER — Ambulatory Visit (INDEPENDENT_AMBULATORY_CARE_PROVIDER_SITE_OTHER): Payer: Medicare Other | Admitting: Audiology

## 2021-11-15 DIAGNOSIS — H903 Sensorineural hearing loss, bilateral: Secondary | ICD-10-CM

## 2021-11-19 ENCOUNTER — Ambulatory Visit
Admission: RE | Admit: 2021-11-19 | Discharge: 2021-11-19 | Disposition: A | Discharge status: Home / Self Care | Payer: Medicare Other | Attending: Allergy | Admitting: Allergy

## 2021-11-19 VITALS — BP 141/67 | HR 59 | Temp 97.0°F | Resp 16 | Ht 62.5 in | Wt 145.7 lb

## 2021-11-19 DIAGNOSIS — D809 Immunodeficiency with predominantly antibody defects, unspecified: Secondary | ICD-10-CM | POA: Insufficient documentation

## 2021-11-19 LAB — IGG, BLOOD: IGG: 936 mg/dL (ref 700–1600)

## 2021-11-19 MED ORDER — CETIRIZINE HCL 10 MG OR TABS
10.0000 mg | ORAL_TABLET | Freq: Once | ORAL | Status: AC
Start: 2021-11-19 — End: 2021-11-19
  Administered 2021-11-19: 10 mg via ORAL
  Filled 2021-11-19: qty 1

## 2021-11-19 MED ORDER — ACETAMINOPHEN 325 MG PO TABS
650.0000 mg | ORAL_TABLET | Freq: Once | ORAL | Status: AC
Start: 2021-11-19 — End: 2021-11-19
  Administered 2021-11-19: 650 mg via ORAL
  Filled 2021-11-19: qty 2

## 2021-11-19 MED ORDER — DEXTROSE 5 % IV SOLN
INTRAVENOUS | Status: DC
Start: 2021-11-19 — End: 2021-11-20

## 2021-11-19 MED ORDER — IMMUNE GLOBULIN (HUMAN) 20 GM/200ML IJ SOLN
0.5000 g/kg | Freq: Once | INTRAMUSCULAR | Status: AC
Start: 2021-11-19 — End: 2021-11-19
  Administered 2021-11-19: 25 g via INTRAVENOUS
  Filled 2021-11-19: qty 200

## 2021-11-19 NOTE — Discharge Instructions (Signed)
AFTER YOUR CATARACT SURGERY    Follow Up :   FRIDAY   11/23/21  8:15 AM  --  [facility] (1ST FLOOR)  --  DR. Rayni Nemitz        Patient Information    This handout will help you make your recovery from cataract surgery more comfortable. Before leaving, you will be given a follow-up appointment either at the [facility] or at the [facility]. An appointment card will be given to you as a reminder.     If you have any questions or concerns that cannot wait until your first visit, please call the [facility] or the [facility]. If the clinic is closed, call the Message Center Page Operator at [phone] and ask to speak to the Ophthalmologist (eye surgeon) on-call.     Call your doctor if:     You have a deep aching pain in the eye that is not relieved by Tylenol, especially if you also have nausea (upset stomach).   You have a sudden increase in redness and pus drainage from the eye.   Your vision gets worse after surgery, you see flashing lights, or a black curtain appears to come down over the vision in your eye.     First night:     ?    Wear the shield today. You may remove the shield to put eye drops in your eye, then re-tape the shield.    Please follow the attached eye drop instruction list.         1. PREDNISOLONE: 1 drop FOUR TIMES A DAY IN SURGICAL EYE      2. MOXIFLOXACIN: 1 drop FOUR TIMES A DAY IN SURGICAL EYE       3. KETOROLAC: 1 drop TWO TIMES A DAY IN THE SURGICAL EYE        BRING ALL YOUR EYE DROPS TO YOUR EYE APPOINTMENTS.      Eye pain:     Mild soreness and a scratchy feeling in your eye can be expected.   Only take Tylenol (acetaminophen) for pain unless other pain medicines have been specifically prescribed.   Do not take any pain relievers (pain pills) that have aspirin for one week after surgery.   If you have a deep, aching pain in the eye that is not relieved by Tylenol (especially if you also have an upset stomach), call your surgeon right away.      Diet:    You may return to your normal diet the day of surgery.      Redness:    Your eye will be red right after surgery. There should be less redness every day.   If you have an increase in redness or pus draining from the eye, call your surgeon right away.     Vision:     Your vision may be blurry right after surgery. It should get better with time.   Your best vision will not be until 6 to 8 weeks after surgery. At this time you will be ordered a new pair of glasses.   If at any time your vision gets worse, you see flashing lights, or a black curtain appears to come down over the vision in your eye, call your surgeon right away.     Safety:     For the first week after surgery, tape the shield over the eye while sleeping.   Wear eye protection at all times for the   weeks (21 days). Any eye glasses or sunglasses will work.     Activity:     Do not bend, stoop, or strain for the first week after surgery.   Do not lift anything heavier than a gallon of milk until your doctor tells you it is okay.   Never rub your eye.  If you have a cough, take cough medicine.   If you are constipated, take a stool softener.   You may start all normal activities as long as they do not involve jerking of the head.   No high-intensity activity like jogging, aerobics, or sex, for 14 days after surgery.   You may shower and wash your hair, but do not get soap or shampoo in your eye.   For the first week (7 days) after surgery, keep the operated eye facing up when you sleep or lie down. Do not lay the eye on the pillow or mattress.     Important phone numbers:     [facility]   [phone]   Clinic hours are 8:00 AM to 4:30 PM,    Monday through Friday.       [place]     [phone]     [facility]      [phone]    Clinic hours are 8:00 AM to noon on Mondays.    Clinic hours are 8:00 AM to 4:30 PM Tuesday through Friday.         [place] [phone]   Ask to speak  to the ophthalmologist (eye surgeon) on call.

## 2021-11-19 NOTE — Interdisciplinary (Signed)
Non-Chemotherapy Infusion Nursing Note - [facility]    Tiffany Chen is a 76 year old female who presents for infusion of IVIG 25 g.    Vitals:    11/19/21 0848 11/19/21 1144   BP: 136/70 141/67   BP Location: Right arm Right arm   BP Patient Position: Sitting Sitting   Pulse: 71 59   Resp: 16 16   Temp: 96.5 F (35.8 C) 97 F (36.1 C)   TempSrc: Temporal Temporal   SpO2: 99%    Weight: 66.1 kg (145 lb 11.6 oz)    Height: 5' 2.5" (1.588 m)      Pain Score: NA (pre med, non-pain or scheduled)  Body surface area is 1.71 meters squared.  Body mass index is 26.23 kg/m.    Pre-treatment nursing assessment:  No problems identified upon assessment. Patient arrives ambulatory, alert and oriented. Pt reports feeling well today, denies any new or worsening symptoms. PIV placed to left forearm with brisk blood return noted. Labs drawn per orders and sent. Pt declined vitals throughout infusion.    Tiffany Chen tolerated treatment well.    Post blood return: Brisk  Post-Flush: NS and Discontinued IV    Medications   dextrose 5% infusion (0 mL IntraVENOUS Completed 11/19/21 1145)   acetaminophen (TYLENOL) tablet 650 mg (650 mg Oral Given 11/19/21 0858)   cetirizine (ZYRTEC) tablet 10 mg (10 mg Oral Given 11/19/21 0858)   immune globulin (human) (IgG) 25 g in 250 mL (GAMUNEX-C) infusion (0 g IntraVENOUS Completed 11/19/21 1144)       Patient Education  Learner: Patient  Barriers to learning: No Barriers  Readiness to learn: Acceptance  Method: Explanation    Treatment Education: Information/teaching given to patient including: signs and symptoms of infection, bleeding, adverse reaction(s), symptom control, and when to notify MD.    [name] Prevention Education: Instructed patient to call for assistance.    Pain Education: Patient instructed to contact nurse if pain should develop or if their current pain therapy becomes ineffective.    Response: Verbalizes understanding    Discharge Plan  Discharge  instructions given to patient.  Future appointments given and reviewed with treatment plan.  Discharge Mode: Ambulatory  Discharge Time: 1147  Accompanied by: Self  Discharged To: Home

## 2021-11-22 ENCOUNTER — Encounter (HOSPITAL_BASED_OUTPATIENT_CLINIC_OR_DEPARTMENT_OTHER): Payer: Self-pay | Admitting: Ophthalmology

## 2021-11-22 ENCOUNTER — Ambulatory Visit
Admission: RE | Admit: 2021-11-22 | Discharge: 2021-11-22 | Disposition: A | Discharge status: Home / Self Care | Payer: Medicare Other | Attending: Ophthalmology | Admitting: Ophthalmology

## 2021-11-22 ENCOUNTER — Ambulatory Visit (HOSPITAL_BASED_OUTPATIENT_CLINIC_OR_DEPARTMENT_OTHER): Payer: Medicare Other | Admitting: Anesthesiology

## 2021-11-22 ENCOUNTER — Encounter (HOSPITAL_BASED_OUTPATIENT_CLINIC_OR_DEPARTMENT_OTHER): Admission: RE | Disposition: A | Discharge status: Home Health | Payer: Self-pay | Attending: Ophthalmology

## 2021-11-22 DIAGNOSIS — H2512 Age-related nuclear cataract, left eye: Secondary | ICD-10-CM | POA: Insufficient documentation

## 2021-11-22 DIAGNOSIS — E1136 Type 2 diabetes mellitus with diabetic cataract: Secondary | ICD-10-CM | POA: Insufficient documentation

## 2021-11-22 DIAGNOSIS — E119 Type 2 diabetes mellitus without complications: Secondary | ICD-10-CM

## 2021-11-22 DIAGNOSIS — K219 Gastro-esophageal reflux disease without esophagitis: Secondary | ICD-10-CM | POA: Insufficient documentation

## 2021-11-22 DIAGNOSIS — Z902 Acquired absence of lung [part of]: Secondary | ICD-10-CM | POA: Insufficient documentation

## 2021-11-22 LAB — GLUCOSE POCT, BLOOD: Glucose (POCT): 112 mg/dL — ABNORMAL HIGH (ref 70–99)

## 2021-11-22 SURGERY — PHACOEMULSIFICATION, CATARACT, WITH IOL IMPLANT
Anesthesia: Monitored Anesthesia Care (MAC) | Site: Eye | Laterality: Left | Wound class: Class I (Clean)

## 2021-11-22 MED ORDER — MIDAZOLAM HCL 2 MG/2ML IJ SOLN
INTRAMUSCULAR | Status: DC | PRN
Start: 2021-11-22 — End: 2021-11-22
  Administered 2021-11-22 (×2): 1 mg via INTRAVENOUS

## 2021-11-22 MED ORDER — ONDANSETRON HCL 4 MG/2ML IV SOLN
INTRAMUSCULAR | Status: DC | PRN
Start: 2021-11-22 — End: 2021-11-22
  Administered 2021-11-22: 4 mg via INTRAVENOUS

## 2021-11-22 MED ORDER — BSS IO SOLN
INTRAOCULAR | Status: DC | PRN
Start: 2021-11-22 — End: 2021-11-22
  Administered 2021-11-22: 15 mL

## 2021-11-22 MED ORDER — NALOXONE HCL 0.4 MG/ML IJ SOLN
20.0000 ug | INTRAMUSCULAR | Status: DC | PRN
Start: 2021-11-22 — End: 2021-11-22
  Filled 2021-11-22: qty 0.05

## 2021-11-22 MED ORDER — PROPARACAINE HCL 0.5 % OP SOLN
OPHTHALMIC | Status: AC
Start: 2021-11-22 — End: 2021-11-22
  Filled 2021-11-22: qty 300

## 2021-11-22 MED ORDER — TROPICAMIDE-CYCLOPENTOLATE-PE 1-1-2.5 % OP SOLN
1.0000 [drp] | OPHTHALMIC | Status: AC
Start: 2021-11-22 — End: 2021-11-22
  Administered 2021-11-22 (×3): 1 [drp] via OPHTHALMIC
  Filled 2021-11-22: qty 0.5

## 2021-11-22 MED ORDER — TROPICAMIDE-CYCLOPENTOLATE-PE 1-1-2.5 % OP SOLN
OPHTHALMIC | Status: AC
Start: 2021-11-22 — End: 2021-11-22
  Filled 2021-11-22: qty 1

## 2021-11-22 MED ORDER — LACTATED RINGERS IV SOLN
INTRAVENOUS | Status: DC
Start: 2021-11-22 — End: 2021-11-22

## 2021-11-22 MED ORDER — CARBACHOL 0.01 % IO SOLN
INTRAOCULAR | Status: DC | PRN
Start: 2021-11-22 — End: 2021-11-22
  Administered 2021-11-22: .5 mL via INTRAOCULAR

## 2021-11-22 MED ORDER — TOBRAMYCIN-DEXAMETHASONE 0.3-0.1 % OP SUSP
OPHTHALMIC | Status: DC | PRN
Start: 2021-11-22 — End: 2021-11-22
  Administered 2021-11-22: 2 [drp] via OPHTHALMIC

## 2021-11-22 MED ORDER — FENTANYL CITRATE (PF) 100 MCG/2ML IJ SOLN
INTRAMUSCULAR | Status: AC
Start: 2021-11-22 — End: 2021-11-22
  Filled 2021-11-22: qty 2

## 2021-11-22 MED ORDER — NA HYALUR & NA CHOND-NA HYALUR 0.55-0.5 ML IO KIT
PACK | INTRAOCULAR | Status: DC | PRN
Start: 2021-11-22 — End: 2021-11-22
  Administered 2021-11-22: 1 via INTRAOCULAR

## 2021-11-22 MED ORDER — MOXIFLOXACIN HCL 5 MG/ML IO SOLN
INTRAOCULAR | Status: DC | PRN
Start: 2021-11-22 — End: 2021-11-22
  Administered 2021-11-22: .1 mL via INTRACAMERAL

## 2021-11-22 MED ORDER — TETRACAINE HCL 0.5 % OP SOLN
OPHTHALMIC | Status: DC | PRN
Start: 2021-11-22 — End: 2021-11-22
  Administered 2021-11-22: 2 [drp] via OPHTHALMIC

## 2021-11-22 MED ORDER — PROPARACAINE HCL 0.5 % OP SOLN
1.0000 [drp] | OPHTHALMIC | Status: AC
Start: 2021-11-22 — End: 2021-11-22
  Administered 2021-11-22 (×3): 1 [drp] via OPHTHALMIC
  Filled 2021-11-22: qty 1

## 2021-11-22 MED ORDER — MIDAZOLAM HCL 2 MG/2ML IJ SOLN
INTRAMUSCULAR | Status: AC
Start: 2021-11-22 — End: 2021-11-22
  Filled 2021-11-22: qty 2

## 2021-11-22 MED ORDER — LACTATED RINGERS IV SOLN
INTRAVENOUS | Status: DC | PRN
Start: 2021-11-22 — End: 2021-11-22

## 2021-11-22 MED ORDER — BALANCED SALT IO SOLN
INTRAOCULAR | Status: DC | PRN
Start: 2021-11-22 — End: 2021-11-22

## 2021-11-22 MED ORDER — APRACLONIDINE HCL 1 % OP SOLN
OPHTHALMIC | Status: DC | PRN
Start: 2021-11-22 — End: 2021-11-22
  Administered 2021-11-22: 2 [drp] via OPHTHALMIC

## 2021-11-22 MED ORDER — FENTANYL CITRATE (PF) 250 MCG/5ML IJ SOLN
INTRAMUSCULAR | Status: DC | PRN
Start: 2021-11-22 — End: 2021-11-22
  Administered 2021-11-22: 50 ug via INTRAVENOUS

## 2021-11-22 SURGICAL SUPPLY — 15 items
CANNULA ANTERIOR CHAMBER 27GA X 3/8", 3MM ANG TIP (Needles/punch/cannula/biopsy) ×1 IMPLANT
CANNULA ANTERIOR CHAMBER 30GA (Needles/punch/cannula/biopsy) ×1 IMPLANT
CANNULA HYDRODISSECTION 25GA X 1", 7MM FLAT TIP (Needles/punch/cannula/biopsy) ×1
CANNULA OPTHALMIC HYDRODISSECTION 27GA X 7MM (Needles/punch/cannula/biopsy) ×1 IMPLANT
CANNULA WASHOUT 19G (Needles/punch/cannula/biopsy) ×1 IMPLANT
DRESSING TEGADERM HP 2.375" X 2.375" (Dressings/packing) ×1 IMPLANT
GLOVE BIOGEL SUPER-SENSITIVE SIZE 6.5 (Gloves/Gowns) ×5
GOWN SURGICAL ULTRA XL BLUE, AAMI LVL 3 (Gloves/Gowns) IMPLANT
KNIFE SIDEPORT CLEAR CUT 1.0MM DUAL BEVEL ANGLED (Knives/Blades) ×1 IMPLANT
LENS INTRAOCULAR 22.0 DIOPTER (Lens) ×1 IMPLANT
PACK CATARACT SISTER TRANSFORMER (SHILEY PHACO PACK) (Procedure Packs/kits) ×1 IMPLANT
SLEEVE ANTERIOR VITRECTOMY (Misc Medical Supply) IMPLANT
SOLUTION IRR POUR BTL H20 250ML (Non-Pharmacy Meds/Solutions) ×1 IMPLANT
STOPCOCK 33 EXT LEURLOCK (Kits/Sets/Trays) ×1
SUTURE NYLON 10-0 12" CU-5 192101 (Suture)

## 2021-11-22 NOTE — Anesthesia Postprocedure Evaluation (Signed)
Anesthesia Post Note    Patient: Tiffany Chen    Procedure(s) Performed: Procedure(s) with comments:  PHACOEMULSIFICATION, CATARACT, WITH IOL IMPLANT, LEFT EYE - Right Eye then [place] Eye      Final anesthesia type: Monitored Anesthesia Care    Patient location: PACU    Post anesthesia pain: adequate analgesia    Mental status: awake, alert , and oriented    Airway Patent: Yes    Last Vitals:   Vitals Value Taken Time   BP 133/62 11/22/21 1340   Temp 36.3 C 11/22/21 1330   Pulse 71 11/22/21 1340   Resp 16 11/22/21 1340   SpO2 99 % 11/22/21 1340        Post vital signs: stable    Hydration: adequate    N/V:no    Anesthetic complications: no    Plan of care per primary team.

## 2021-11-22 NOTE — Anesthesia Preprocedure Evaluation (Addendum)
ANESTHESIA PRE-OPERATIVE EVALUATION    Patient Information    Name: Tiffany Chen    MRN: [id number]    DOB: 1945/07/20    Age: 76 year old    Sex: female  Procedure(s) with comments:  PHACOEMULSIFICATION, CATARACT, WITH IOL IMPLANT, LEFT EYE - Right Eye then [facility] Eye      Pre-op Vitals:   There were no vitals taken for this visit.        Primary language spoken:  English    ROS/Medical History:          General:   Cardiovascular:     Anesthesia History:   Pulmonary:      Neuro/Psych:    Hematology/Oncology:       GI/Hepatic:  GERD,   Infectious Disease:     Renal:   Endocrine/Other:  diabetes,      Pregnancy History:   Pediatrics:         Pre Anesthesia Testing (PCC/CPC) notes/comments:               Physical Exam    Airway:     Inter-inciser distance 3-4 cm  Prognanth Able    Mallampati: II  Neck ROM: limited  TM distance: 5-6 cm            Cardiovascular:  - cardiovascular exam normal             Pulmonary:  - pulmonary exam normal              Neuro/Neck/Skeletal/Skin:      Dental:      Abdominal:          Last OSA (STOP BANG) Score:   Has a physician diagnosed you with sleep apnea?: No  Do you snore loudly (loud enough to be heard through a closed door)?: 0  Do you often feel tired, fatigued or sleepy during the day?: 0  Has anyone observed that you stop breathing while you are sleeping?: 0  Have you ever been treated for high blood pressure?: 1  OSA total score (A score of 2 or more is high risk. 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     Past Surgical History:   Procedure Laterality Date    BRONCHOSCOPY  12/2020    CESAREAN SECTION, LOW TRANSVERSE      COLOSTOMY      CRANIOTOMY      LUNG LOBECTOMY       Social History     Socioeconomic History    Marital status: Widowed   Tobacco Use    Smoking status: Never    Smokeless tobacco: Never   Vaping Use  Vaping Use: Never used   Substance and Sexual Activity    Alcohol use: Yes     Comment: glass of wine every other month    Drug use: Not Currently    Sexual activity: Not Currently   Other Topics Concern    Military Service No    Blood Transfusions No    Caffeine Concern No    Occupational Exposure No    Hobby Hazards Yes     Comment: Gardening, MAC    Sleep Concern No    Stress Concern Yes     Comment: Husband death Cov19, 2020, sold home, rehomed pets. [place]    Weight Concern No    Special Diet Yes    Back Care No    Exercises Regularly Yes    Bike Helmet Use No    Seat Belt Use No    Performs Self-Exams No   Social History Narrative    Lives with brother Jeneen Rinks at home. Sister also lives in [place] Determinants of Health      Exercise is Medicine (EIM)   Intimate Partner Violence: Low Risk  (11/13/2021)    [place] IPV     IPV Risk Score: 0     Alcohol Use: Not on file       Current Facility-Administered Medications   Medication Dose Route Frequency Provider Last Rate Last Admin    proparacaine (ALCAINE) 0.5 % ophthalmic solution 1 drop  1 drop Left Eye Q5 Min Derry Lory Combs, MD        tropicamide cyclopentolate PHENYLephrine 1-1-2.5 % topical ophthalmic solution 1 drop  1 drop Left Eye Q5 Min  Derry Lory [place], MD         Allergies   Allergen Reactions    Walnuts Swelling     Tongue swollen and burn       Labs and Other Data  Lab Results   Component Value Date    NA 141 10/16/2021    K 4.2 10/16/2021    CL 105 10/16/2021    BICARB 27 10/16/2021    BUN 16 10/16/2021    CREAT 0.75 10/16/2021    GLU 104 (H) 10/16/2021    [place] 9.2 10/16/2021     Lab Results   Component Value Date    AST 26 10/16/2021    ALT 22 10/16/2021    ALK 56 10/16/2021    TP 6.7 10/16/2021    ALB 3.9 10/16/2021    TBILI 0.28 10/16/2021    DBILI <0.2 07/17/2020     Lab Results   Component Value Date    WBC 5.0 10/16/2021    RBC 4.00 10/16/2021    HGB 12.2 10/16/2021    HCT 36.6 10/16/2021    MCV 91.5 10/16/2021    MCHC 33.3 10/16/2021    RDW 14.4 (H) 10/16/2021    PLT 238 10/16/2021    MPV 10.8 10/16/2021    SEG 53 10/16/2021    LYMPHS 31 10/16/2021    MONOS 11 10/16/2021    EOS 4 10/16/2021    BASOS 1 10/16/2021     No results found for: "INR", "PTT"  No results found for: "ARTPH", "ARTPO2", "ARTPCO2"    Anesthesia Plan:  Risks and Benefits of Anesthesia  I have personally performed an appropriate pre-anesthesia physical exam of the patient (including heart, lungs, and airway) prior to the anesthetic and reviewed the pertinent medical history, drug and allergy history, laboratory and imaging studies and  consultations.   I have determined that the patient has had adequate assessment and testing.  I have validated the documentation of these elements of the patient exam and/or have made necessary changes to reflect my own observations during my pre-anesthesia exam.  Anesthetic techniques, invasive monitors, anesthetic drugs for induction, maintenance and post-operative analgesia, risks and alternatives have been explained to the patient and/or patient's representatives.    I have prescribed the anesthetic plan:         Planned anesthesia method: Monitored Anesthesia Care         ASA 3 (Severe systemic disease)     Potential anesthesia  problems identified and risks including but not limited to the following were discussed with patient and/or patient's representative: Adverse or allergic drug reaction and Recall        Planned monitoring method: Routine monitoring    Informed Consent:  Anesthetic plan and risks discussed with Patient.

## 2021-11-22 NOTE — H&P (Signed)
Attending Attestation of H&P:    I agree with the content of the H&P as stated: yes  I have corrections or additions as indicated below: no    History & Physical - Interval Assessment:    Abrial Arrighi  [phone]    Current medical status:  Unchanged    Medications/allergies:   Unchanged    Review of symptoms:  Unchanged    Physical examination:  I have examined the patient today.  Unchanged    Laboratory or clinical data:  Unchanged    Modifications of initial care plan:  Unchanged    No interval change from preoperative evaluation with Arta Silence, NP on 11/01/21.    Clarissa Laird Michelene Gardener 11/22/21  12:56 PM

## 2021-11-22 NOTE — Brief Op Note (Signed)
OPHTHALMOLOGY BRIEF OPERATIVE NOTE    PREOPERATIVE DIAGNOSIS:  Nuclear sclerosis cataract, left eye.      POSTOPERATIVE DIAGNOSIS: Nuclear sclerosis cataract, left eye.     PROCEDURE: Phacoemulsification and intraocular lens placement, left eye.   Lens Model:   [id number]     Power:  +22.0  D    ZO:[id number]    ATTENDING SURGEON: Derry Lory, MD    ASSISTANTS(s): N/A    ANESTHESIA: MAC    COMPLICATIONS:  None    WOUND CLASSIFICATION STATUS: Class 1     WOUND CLOSURE STATUS: Class 1     SPECIMENS:  None    DISPOSITION: Home

## 2021-11-22 NOTE — Op Note (Signed)
Dictating Practitioner: Denyse Amass, M.D.    Staff Physician: Denyse Amass, M.D.    Date of Operation: 11/22/21     PREOPERATIVE DIAGNOSIS: 1) Visually significant nuclear sclerotic cataract, left eye.    POSTOPERATIVE DIAGNOSIS: 1) Visually significant nuclear sclerotic cataract, left eye.  PROCEDURE: 1) Cataract extraction by phacoemulsification with intraocular lens implant, left eye.     SURGEON/STAFF: Denyse Amass, MD    ASSISTANT: N/A  INTRAOCULAR LENS DATA:  MODEL: [id number]   POWER:  +22.0 D     SN [id number].  ANESTHESIA: Monitored anesthesia care with topical.      INDICATIONS: This patient is a 76 year old female with visually significant cataract in the left eye with symptoms of decreased vision which could not be sufficiently improved by other means. This has impacted the patient's activities of daily living (ADLs) including difficulty with reading, sewing and unable to read the captions on television. She also reports color perception is affected, "the world is grey", and patient is unable to drive at night. The patient presents to the [facility] for definitive treatment of this condition. The benefits,  a reasonable expectation for visual improvement, alternative of not performing surgery, and risks including but not limited to infection, bleeding, loss of vision, retinal detachment, increased intraocular pressure, corneal decompensation, and the possible need for further surgery were discussed in detail with the patient. All the patient's questions were answered, and the patient consented to cataract extraction with lens implant to the left eye.     DESCRIPTION OF PROCEDURE: After routine preoperative clearance, the patient was identified in the preoperative holding area and the left eye was marked as the correct surgical eye. The patient was then brought to the main operating room and placed under intravenous sedation with monitored anesthesia care by the Anesthesia Service. The eye was  prepped and draped in the usual sterile ophthalmic fashion.     A wire lid speculum was placed between the patient's eyelids,  a temporal clear corneal biplanar incision was created  using a 2.4 mm keratome followed by 1% preservative free lidocaine. Next, the anterior chamber was formed using viscoelastic and a side-port incision was made. A continuous curvilinear capsulorrhexis was created using a bent cystitome and Utrata forceps. Hydrodissection was achieved using balanced saline solution on a cannula, and the cataract was removed by phacoemulsification using a divide and conquer technique. Irrigation and aspiration was used to remove remaining cortical material, and the capsular bag was filled using viscoelastic. The intraocular lens was injected into the capsular bag and centered. The remaining viscoelastic was removed using irrigation and aspiration and Miostat was instilled to constrict the pupil. Moxifloxacin 0.1 cc was instilled into the anterior chamber. The corneal wounds were hydrated, tested, and deemed to be entirely watertight. The lid speculum was removed. TobraDex and Iopidine ophthalmic drops were instilled followed by a clear Fox shield placed over the patient's eye.     The patient tolerated the procedure well, and there were no complications. The patient was transferred to the recovery room in stable condition. Dr. Cyndie Chime was present for and performed all aspects of this case.

## 2021-11-23 ENCOUNTER — Other Ambulatory Visit: Payer: Self-pay

## 2021-11-23 ENCOUNTER — Ambulatory Visit (INDEPENDENT_AMBULATORY_CARE_PROVIDER_SITE_OTHER): Payer: Medicare Other | Admitting: Ophthalmology

## 2021-11-23 DIAGNOSIS — Z9842 Cataract extraction status, left eye: Secondary | ICD-10-CM

## 2021-11-23 DIAGNOSIS — Z961 Presence of intraocular lens: Secondary | ICD-10-CM

## 2021-11-23 DIAGNOSIS — Z09 Encounter for follow-up examination after completed treatment for conditions other than malignant neoplasm: Secondary | ICD-10-CM

## 2021-11-23 MED ORDER — CLOFAZIMINE IRB 21-0063 50 MG CAPSULE
0 refills | Status: DC
Start: 2021-11-23 — End: 2022-03-27
  Filled 2021-11-23: qty 200, 100d supply, fill #0

## 2021-11-23 NOTE — Assessment & Plan Note (Signed)
POD#1: CEIOL OS (11/22/21).  Doing well. Patient happy with vision.  Pred Forte & Moxifloxacin QID OS.  Ketorolac BID OS.  Postop instructions reviewed, written medication schedule given.  FU 1 week

## 2021-11-23 NOTE — Progress Notes (Signed)
Status post cataract extraction and insertion of intraocular lens of left eye  Assessment & Plan:  POD#1: CEIOL OS (11/22/21).  Doing well. Patient happy with vision.  Pred Forte & Moxifloxacin QID OS.  Ketorolac BID OS.  Postop instructions reviewed, written medication schedule given.  FU 1 week          No follow-ups on file.

## 2021-11-26 ENCOUNTER — Other Ambulatory Visit: Payer: Self-pay

## 2021-11-26 ENCOUNTER — Other Ambulatory Visit: Payer: Medicare Other | Attending: Nurse Practitioner

## 2021-11-26 DIAGNOSIS — J479 Bronchiectasis, uncomplicated: Secondary | ICD-10-CM | POA: Insufficient documentation

## 2021-11-26 LAB — COMPREHENSIVE METABOLIC PANEL, BLOOD
ALT (SGPT): 32 U/L (ref 0–33)
AST (SGOT): 37 U/L — ABNORMAL HIGH (ref 0–32)
Albumin: 4.3 g/dL (ref 3.5–5.2)
Alkaline Phos: 52 U/L (ref 40–130)
Anion Gap: 11 mmol/L (ref 7–15)
BUN: 23 mg/dL (ref 8–23)
Bicarbonate: 24 mmol/L (ref 22–29)
Bilirubin, Tot: 0.36 mg/dL (ref <1.2)
Calcium: 9.6 mg/dL (ref 8.5–10.6)
Chloride: 104 mmol/L (ref 98–107)
Creatinine: 0.79 mg/dL (ref 0.51–0.95)
Glucose: 115 mg/dL — ABNORMAL HIGH (ref 70–99)
Potassium: 4.4 mmol/L (ref 3.5–5.1)
Sodium: 139 mmol/L (ref 136–145)
Total Protein: 7.3 g/dL (ref 6.0–8.0)
eGFR Based on CKD-EPI 2021 Equation: >60 mL/min/{1.73_m2}

## 2021-11-26 LAB — CBC WITH DIFF, BLOOD
ANC-Automated: 2.6 10*3/uL (ref 1.6–7.0)
Abs Basophils: 0 10*3/uL (ref <0.2)
Abs Eosinophils: 0.1 10*3/uL (ref 0.0–0.5)
Abs Lymphs: 1.2 10*3/uL (ref 0.8–3.1)
Abs Monos: 0.4 10*3/uL (ref 0.2–0.8)
Basophils: 1 %
Eosinophils: 3 %
Hct: 38.1 % (ref 34.0–45.0)
Hgb: 12.5 gm/dL (ref 11.2–15.7)
Lymphocytes: 28 %
MCH: 30.3 pg (ref 26.0–32.0)
MCHC: 32.8 g/dL (ref 32.0–36.0)
MCV: 92.5 um3 (ref 79.0–95.0)
MPV: 10.2 fL (ref 9.4–12.4)
Monocytes: 10 %
Plt Count: 209 10*3/uL (ref 140–370)
RBC: 4.12 10*6/uL (ref 3.90–5.20)
RDW: 14.3 % — ABNORMAL HIGH (ref 12.0–14.0)
Segs: 58 %
WBC: 4.4 10*3/uL (ref 4.0–10.0)

## 2021-12-03 ENCOUNTER — Other Ambulatory Visit (INDEPENDENT_AMBULATORY_CARE_PROVIDER_SITE_OTHER): Payer: Self-pay

## 2021-12-03 ENCOUNTER — Ambulatory Visit (INDEPENDENT_AMBULATORY_CARE_PROVIDER_SITE_OTHER): Payer: Medicare Other | Admitting: Ophthalmology

## 2021-12-03 DIAGNOSIS — Z09 Encounter for follow-up examination after completed treatment for conditions other than malignant neoplasm: Secondary | ICD-10-CM

## 2021-12-03 DIAGNOSIS — Z961 Presence of intraocular lens: Secondary | ICD-10-CM

## 2021-12-03 DIAGNOSIS — Z9842 Cataract extraction status, left eye: Secondary | ICD-10-CM

## 2021-12-03 DIAGNOSIS — Z9841 Cataract extraction status, right eye: Secondary | ICD-10-CM

## 2021-12-03 DIAGNOSIS — I10 Essential (primary) hypertension: Secondary | ICD-10-CM

## 2021-12-03 NOTE — Assessment & Plan Note (Signed)
POD# 25: CEIOL OD ( 11/08/21).  Continue to taper off Pred Forte.  FU 4 weeks, refract & dilate.

## 2021-12-03 NOTE — Progress Notes (Signed)
Status post cataract extraction and insertion of intraocular lens of left eye  Assessment & Plan:  POD# 11: CEIOL OS (11/22/21)  Doing well.  Taper Pred Forte, reduce one drop per week.  Stop Moxifloxacin  Continue Ketorolac BID for 2 weeks.  FU 4 weeks, refract and dilate OU.      Status post cataract extraction and insertion of intraocular lens of right eye  Assessment & Plan:  POD# 25: CEIOL OD ( 11/08/21).  Continue to taper off Pred Forte.  FU 4 weeks, refract & dilate.          No follow-ups on file.

## 2021-12-03 NOTE — Assessment & Plan Note (Signed)
POD# 11: CEIOL OS (11/22/21)  Doing well.  Taper Pred Forte, reduce one drop per week.  Stop Moxifloxacin  Continue Ketorolac BID for 2 weeks.  FU 4 weeks, refract and dilate OU.

## 2021-12-07 NOTE — Addendum Note (Signed)
Addended by: Clydene Pugh on: 12/07/2021 01:56 PM     Modules accepted: Orders

## 2021-12-12 ENCOUNTER — Other Ambulatory Visit: Payer: Self-pay

## 2021-12-12 ENCOUNTER — Telehealth (HOSPITAL_BASED_OUTPATIENT_CLINIC_OR_DEPARTMENT_OTHER): Payer: Self-pay

## 2021-12-12 DIAGNOSIS — D809 Immunodeficiency with predominantly antibody defects, unspecified: Secondary | ICD-10-CM

## 2021-12-12 NOTE — Telephone Encounter (Signed)
Dr. Selena Batten,    Tiffany Chen is scheduled for Infusion of IVIG on 12/17/2021.     Please review the treatment plan and add more treatment days if appropriate  If orders remain unsigned, the patient will be contacted 24 hours prior to their scheduled appointment notifying them of cancellation. The infusion appointment will then be rescheduled after orders are signed.     Patient has a scheduled clinic visit on 01/30/2022.       Thank you,   Otilio Connors, RN

## 2021-12-12 NOTE — Telephone Encounter (Signed)
Orders signed for next cycle

## 2021-12-13 ENCOUNTER — Ambulatory Visit (HOSPITAL_BASED_OUTPATIENT_CLINIC_OR_DEPARTMENT_OTHER)
Admission: RE | Admit: 2021-12-13 | Discharge: 2021-12-13 | Disposition: A | Discharge status: Home Health | Payer: Medicare Other | Attending: Critical Care Medicine | Admitting: Critical Care Medicine

## 2021-12-13 ENCOUNTER — Ambulatory Visit
Admission: RE | Admit: 2021-12-13 | Discharge: 2021-12-13 | Disposition: A | Discharge status: Home / Self Care | Payer: Medicare Other | Attending: Critical Care Medicine | Admitting: Critical Care Medicine

## 2021-12-13 DIAGNOSIS — R1310 Dysphagia, unspecified: Secondary | ICD-10-CM | POA: Insufficient documentation

## 2021-12-13 DIAGNOSIS — J984 Other disorders of lung: Secondary | ICD-10-CM | POA: Insufficient documentation

## 2021-12-13 DIAGNOSIS — J479 Bronchiectasis, uncomplicated: Secondary | ICD-10-CM | POA: Insufficient documentation

## 2021-12-13 DIAGNOSIS — A310 Pulmonary mycobacterial infection: Secondary | ICD-10-CM | POA: Insufficient documentation

## 2021-12-13 MED ORDER — BARIUM SULFATE 40 % PO SUSR
30.0000 mL | Freq: Once | ORAL | Status: AC
Start: 2021-12-13 — End: 2021-12-13
  Administered 2021-12-13: 30 mL via ORAL
  Filled 2021-12-13: qty 30

## 2021-12-13 MED ORDER — BARIUM SULFATE 40 % OR SUSP
10.0000 mL | Freq: Once | ORAL | Status: AC
Start: 2021-12-13 — End: 2021-12-13
  Administered 2021-12-13: 10 mL via ORAL
  Filled 2021-12-13: qty 10

## 2021-12-13 MED ORDER — BARIUM SULFATE 700 MG PO TABS
700.0000 mg | ORAL_TABLET | Freq: Once | ORAL | Status: AC
Start: 2021-12-13 — End: 2021-12-13
  Administered 2021-12-13: 700 mg via ORAL
  Filled 2021-12-13: qty 1

## 2021-12-13 MED ORDER — BARIUM SULFATE 40 % PO PSTE
20.0000 mL | PASTE | Freq: Once | ORAL | Status: AC
Start: 2021-12-13 — End: 2021-12-13
  Administered 2021-12-13: 20 mL via ORAL
  Filled 2021-12-13: qty 20

## 2021-12-13 NOTE — Interdisciplinary (Signed)
Speech Language Pathology Swallow Evaluation - Video Swallow Study       Preferred Language:English    MEDICARE POC: Not required     Assessment: Pt presents with WNL oral pharyngeal swallow function, no e/o laryngeal aspiration on imaging today. PAS (penetration aspiration score) - 1.    Trace vallecular residue with larger cup sips thin liquid, clears with repeat swallow. Minimal osteophytes observed at C4-C5 with correlating minimal CP bar that did not occlude bolus passage.     Query ?dysmotility in the thoracic esophagus and decreased clearance through the UES - no overt retroflow to pharynx however recommend f/u with GI given reflux Hx and possible role reflux can play in aspiration after the swallow.     Plan: We reviewed standard aspiration precautions including: taking small bites and sips, chewing all foods thoroughly, limiting external distractions and no talking while eating or drinking. Pt. V/u of all education. No skilled ST indicated at this time.      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 Pt with h/o esophagram on 08/07/2021 with "Mild tertiary contractions. Questionable tiny hiatal hernia". Pt denies Dysphagia  sx except for occasional cough with red wine and water (infrequent) and chronic occasional globus with meds 2/2 multiple amounts PO meds taken daily. Eats a regular texture diet, denies: odynophagia, unintentional weight loss 2/2 Dysphagia. Reports intermittent h/o GERD sx - reports was initially asymptomatic, Rx stopped and sx felt at that time.     Prior level of function Minimal deficits;Other (Comment)  very occasional cough with red wine or water, cough reported at baseline     Employment Status Retired     Surveyor, mining and [employer]     Speech treatment diagnosis  Dysphagia Unspecified       Row Name 12/13/21 1000          Fall Risk    1 or more of the following as reported by patient/caregiver = patient risk for falls No fall risk identified                      SP Gen Assess       Row Name 12/13/21 1000          Additional Medical Data    Equipment Present None     Pain Rating (0-10) 0     Pain Location denied     Respiratory Status  Room air     Level of alertness Alert;Attentive     Date of Chest X-Ray 06/22/21  Post op Chest CT, not CXR     Chest X-Ray Opacity;Improved from last exam  CHEST CT not CXR       Row Name 12/13/21 1000          Eating Assessment Tool (EAT-10)    My swallowing problem has caused me to lose weight 0     My swallowing problem interferes with my ability to go out for meals 0     Swallowing liquids takes extra effort 0     Swallowing solids takes extra effort 0     Swallowing pills takes extra effort 0     Swallowing is painful 0     The pleasure of eating is affected by my swallowing 0     When I swallow food sticks in my throat 0     I cough when I eat 1     Swallowing is stressful 0     Total score 1                    SP Oral Motor Eval       Row Name 12/13/21 1000          Oral Motor Evaluation    Dentition Within normal limits     Buccal cavity Normal     Mucosal Quality  Good     Hard palate  Normal     Vocal Quality  Good       Row Name 12/13/21 1000          CN V-Trigeminal     Tone/Mass of [place] and Temporalis Normal     Jaw movement Normal     Protrusion/Retraction of Mandible Normal       Row Name 12/13/21 1000          CN VII-Facial    Facial Muscles Intact bilaterally     Forehead Normal     Eyelids Normal     Lips Normal       Row Name 12/13/21 1000          CN IX-X Vagus-Glossopharyngeal    Velum at Rest Symmetric     Soft palate Elevates symmetrically     Velar Movement with /a/ Elevates symmetrically     Gag Reflex Unable to evaluate/assess       Row Name 12/13/21 1000          CN XII Hypoglossal    Tongue Protrusion/Retraction Normal     Tongue Lateralization Normal     Tongue movement Normal protrusion                    SP Bedside Swallow       Row Name 12/13/21 1000          Swallow recommendations    Recommendations Continue current diet;Instrumental swallow study;Aspiration precautions     Aspiration Precautions  Sit upright while eating and drinking;Monitor for signs of aspiration;Maintain upright posture after eating for 30 minutes       Row Name 12/13/21 1000          Clinical Swallow Evaluation    Bedside Swallow Eval Source Other (Comment)     Dysphagia Symptoms  Denies symptoms dysphagia;Other( comments)  VERY OCCASIONAL COUGH WITH RED WINE OR WATER     Current diet PO regular solids;Thin liquids  Oral Secretions Normal     Respirations Normal     Food/liquid administered during evaluation Thin liquid by cup     Lip seal Within normal limits     Oral Prep  Intact     Mastication Other (comments)  n/a     Swallow Initiation Intact     Hyolaryngeal elevation Intact     Oral stasis No     Swallow efficiency Within normal limits;No overt symptoms of laryngeal penetration/aspiration;Clear voice noted across trials     Duration of deglutition Normal     Problems No dysphagia noted/functional swallow     Clinical Exam Recommendations Video swallow study (completed today, see results)                    SP Video Swallow       Row Name 12/13/21 1000          Video Swallow  Evaluation    Radiologist  [place]     Total Flouroscopy time (min) --  115 seconds     Oral Phase Normal     Velar Function Normal     Base of tongue retraction Normal     Hyolaryngeal elevation Normal     Epiglottic movement Normal     Pharyngeal wall (PW) movement Normal;Anterior cervical osteophytes;Other (comments)  C4-C5, minimal, non obstructive CP bar     Esophageal sphincter (UES) All boluses passed through cervical esophagus     Esophagus Other (comments)  query dysmotility, thoracic stasis     Thin liquid boluses Trace residue;Vallecular residue;Residue clears with multiple swallows;Normal     Nectar thick liquid boluses Normal;Other (comments)  administered in AP view only     Honey thick liquid Not administered     Puree bolus Normal     Mechanical soft boluses Normal     Mixed consistency boluses Not administered     Regular solid boluses Normal     Barium sulfate pill Normal swallow     Compensatory techniques used Multiple swallows     Problems No problems, pt with functional swallow     Impression No evidence of dysphagia, functional oropharyngeal swallow;Possible esophageal dysphagia     Penetration/Asp Scale 1 - Material does not enter airway                                    SP Plan       Row Name 12/13/21 1000          Outcome Measure    Assessment Tool EAT-10, MBSS/VSS       Row Name 12/13/21 1000          Treatment Assessment     Speech treatment diagnosis  Dysphagia Unspecified     Assessment  Pt presents with WNL oral pharyngeal swallow function, no e/o laryngeal aspiration on imaging today. Trace vallecular residue with larger cup sips thin liquid, clears with repeat swallow. Minimal osteophytes at C4-C5 with correlating minimal CP bar that did not occlude bolus passage. ?Dysmotility in the thoracic esophagus and decreased clearance through the UES - recommend f/u with GI. We reviewed standard aspiration precautions including: taking small bites and sips, chewing all foods  thoroughly, limiting external distractions and no talking while eating or drinking. No skilled ST indicated at this time.       [place] Name 12/13/21 1000  Patient stated goal    Patient Stated Goal none stated related to [place] Name 12/13/21 1000          Patient Education    Do you have any questions Yes --all questions asked and answered     How do you best learn Verbal instruction;Demonstration     Include in my healthcare Myself;healthcare team     Patient/Family teaching Completed this visit       [place] Name 12/13/21 1000          Treatment Today    92526  Tx of swallow dysfunction and/or oral function for feeding Airway protection strategies;PO trials;Pt education/home exercises       Row Name 12/13/21 1000          Treatment Plan    Treatment plan No treatment indicated/within functional limits     Frequency Discharge from therapy     Duration of treatment (in weeks) One time only, further treatment not indicated     Recommendations Continue current diet;Instrumental swallow study;Aspiration precautions       Row Name 12/13/21 1000          Additional Recommendations    Discussion and Agreement Patient     Agreement to Plan of Care Patient stated understanding and agreement;No family/caregiver present     Family present for treatment None present     Additional recommendations  GI consult     Interdisciplinary Communication  Other (Comment)  routed to referring MD     Compensatory techniques used Multiple swallows     Aspiration Precautions  Sit upright while eating and drinking;Monitor for signs of aspiration;Maintain upright posture after eating for 30 minutes       Row Name 12/13/21 1000          SLP [place]    Does pt. require additional therapy session prior to DC? No       Row Name 12/13/21 1000          Interventions    Objective  ST reviewed the following therapeutic topics and counselling today; review of swallow anatomy and physiology, review of pt's own VSS/MBSS  imaging, review of aspiration precautions and swallow strategies as well as role of ST and ST POC where indicated.  Pt v/u of all education.

## 2021-12-14 ENCOUNTER — Encounter: Payer: Self-pay | Admitting: Hospital

## 2021-12-17 ENCOUNTER — Ambulatory Visit
Admission: RE | Admit: 2021-12-17 | Discharge: 2021-12-17 | Disposition: A | Discharge status: Home / Self Care | Payer: Medicare Other | Attending: Allergy | Admitting: Allergy

## 2021-12-17 ENCOUNTER — Other Ambulatory Visit: Payer: Self-pay

## 2021-12-17 VITALS — BP 138/73 | HR 58 | Temp 96.8°F | Resp 16 | Wt 145.5 lb

## 2021-12-17 DIAGNOSIS — D809 Immunodeficiency with predominantly antibody defects, unspecified: Secondary | ICD-10-CM | POA: Insufficient documentation

## 2021-12-17 MED ORDER — CETIRIZINE HCL 10 MG OR TABS
10.0000 mg | ORAL_TABLET | Freq: Once | ORAL | Status: DC
Start: 2021-12-17 — End: 2021-12-18

## 2021-12-17 MED ORDER — DEXTROSE 5 % IV SOLN
INTRAVENOUS | Status: DC
Start: 2021-12-17 — End: 2021-12-18

## 2021-12-17 MED ORDER — IMMUNE GLOBULIN (HUMAN) 20 GM/200ML IJ SOLN
0.5000 g/kg | Freq: Once | INTRAMUSCULAR | Status: AC
Start: 2021-12-17 — End: 2021-12-17
  Administered 2021-12-17: 25 g via INTRAVENOUS
  Filled 2021-12-17: qty 50

## 2021-12-17 MED ORDER — DIPHENHYDRAMINE HCL 25 MG OR TABS OR CAPS CUSTOM
25.0000 mg | ORAL_CAPSULE | Freq: Once | ORAL | Status: DC | PRN
Start: 2021-12-17 — End: 2021-12-21
  Administered 2021-12-17: 25 mg via ORAL
  Filled 2021-12-17: qty 1

## 2021-12-17 MED ORDER — ACETAMINOPHEN 325 MG PO TABS
650.0000 mg | ORAL_TABLET | Freq: Once | ORAL | Status: AC
Start: 2021-12-17 — End: 2021-12-17
  Administered 2021-12-17: 650 mg via ORAL
  Filled 2021-12-17: qty 2

## 2021-12-17 NOTE — Telephone Encounter (Signed)
Health Risk Assessment Questionnaire:  connected with patient regarding HRA, patient completed questionnaire with Care Navigator for upcoming Medicare Annual Wellness Visit.

## 2021-12-17 NOTE — Interdisciplinary (Signed)
Non-Chemotherapy Infusion Nursing Note - [facility] Tiffany Chen is a 76 year old female who presents for infusion of IVIG 25 g    Vitals:    12/17/21 0700 12/17/21 0854 12/17/21 1132   BP:  125/71 138/73   BP Location:  Left arm    BP Patient Position:  Sitting    Pulse:  71 58   Resp:  16 16   Temp:  96.9 F (36.1 C) 96.8 F (36 C)   TempSrc:  Temporal Temporal   SpO2:  98% 98%   Weight: 66 kg (145 lb 8.1 oz)       Pain Score: NA (pre med, non-pain or scheduled)  Body surface area is 1.71 meters squared.  Body mass index is 26.19 kg/m.    Medications   dextrose 5% infusion (0 mL IntraVENOUS Stopped 12/17/21 1130)   cetirizine (ZYRTEC) tablet 10 mg (has no administration in time range)   diphenhydrAMINE (BENADRYL) tablet or capsule 25 mg (25 mg Oral Given 12/17/21 0907)   acetaminophen (TYLENOL) tablet 650 mg (650 mg Oral Given 12/17/21 0907)   immune globulin (human) (IgG) 25 g in 250 mL (GAMUNEX-C) infusion (0 g IntraVENOUS Completed 12/17/21 1130)       Pre-treatment nursing assessment:  No problems identified upon assessment.    Tiffany Chen tolerated treatment well.    Post blood return: Brisk  Post-Flush: NS and Discontinued IV    Patient Education  Learner: Patient  Barriers to learning: No Barriers  Readiness to learn: Acceptance  Method: Explanation    Treatment Education: Information/teaching given to patient including: signs and symptoms of infection, bleeding, adverse reaction(s), symptom control, and when to notify MD.    [name] Prevention Education: Instructed patient to call for assistance.    Pain Education: Patient instructed to contact nurse if pain should develop or if their current pain therapy becomes ineffective.    Response: Verbalizes understanding    Discharge Plan  Discharge instructions given to patient.  Requested-Future appointments given and reviewed with treatment plan.  Discharge Mode: Ambulatory  Discharge Time: 1130  Accompanied by: Self  Discharged  To: Home

## 2021-12-18 ENCOUNTER — Encounter (INDEPENDENT_AMBULATORY_CARE_PROVIDER_SITE_OTHER): Payer: Self-pay

## 2021-12-18 ENCOUNTER — Ambulatory Visit (INDEPENDENT_AMBULATORY_CARE_PROVIDER_SITE_OTHER): Payer: Medicare Other

## 2021-12-18 VITALS — BP 123/68 | HR 78 | Temp 98.4°F | Ht 62.0 in | Wt 142.0 lb

## 2021-12-18 DIAGNOSIS — E1169 Type 2 diabetes mellitus with other specified complication: Secondary | ICD-10-CM

## 2021-12-18 DIAGNOSIS — Z1339 Encounter for screening examination for other mental health and behavioral disorders: Secondary | ICD-10-CM

## 2021-12-18 DIAGNOSIS — E119 Type 2 diabetes mellitus without complications: Secondary | ICD-10-CM | POA: Insufficient documentation

## 2021-12-18 DIAGNOSIS — Z1389 Encounter for screening for other disorder: Secondary | ICD-10-CM

## 2021-12-18 DIAGNOSIS — Z0000 Encounter for general adult medical examination without abnormal findings: Secondary | ICD-10-CM

## 2021-12-18 DIAGNOSIS — R32 Unspecified urinary incontinence: Secondary | ICD-10-CM

## 2021-12-18 NOTE — Progress Notes (Addendum)
Chief Complaint   Patient presents with    Wellness Visit     Discuss metformin ?   Urine leaking x4 months      History of present illness: Tiffany Chen is a 76 year old female who presents to this clinic for her Medicare annual wellness visit. This is the patient's initial/subsequent visit.    SUBJECTIVE:     - Upcoming hernia repair and ostomy reversal  - Lives with sister in a granny apartment, manages ADLs independently    T2DM  - Regimen: Metformin 500 mg BID  - A1c 6.8% in 06/2021  - Diet: Cravings for sugar at night, minimal takeout, homecooked food  - Exercise: walks 1.5 - 2 miles for 5-6 days/week    Microvascular complications:  - 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/2021, cataracts removed  - Diabetic nephropathy: Last urine micro albumin - none on file  - Diabetic neuropathy: Last monofilament - will perform today    Macrovascular complications:  - Peripheral vascular disease: N/A  - Coronary artery disease: N/A - Takes Ramipril, Atorvastatin  - Cerebrovascular events: N/A    Urine incontinence  - Leakage problems x months  - Occurs with laughing, coughing, squeezing  - No sensation for full bladder, will leak  - Occasional kegal exercises  - Tries to time voids throughout the day  - No dysuria, hematuria, frequency  - Hesitant about starting more medications  - Open to pelvic floor PT    Depression/Anxiety  - Reports December is a difficult month for her due to reminders of her son/husband who have now passed  - Previously referred to [facility], however had significant issues with scheduling  - Mood currently stable, feels that she has a good support system    Medical and family history: The patient's past medical history, surgical history, and family history were updated today within the electronic medical record.    Patient Active Problem List    Diagnosis Date Noted    Type 2 diabetes mellitus with other specified complication, unspecified whether long term insulin use (CMS-HCC) 12/18/2021     Status post cataract extraction and insertion of intraocular lens of left eye 11/23/2021    Status post cataract extraction and insertion of intraocular lens of right eye 11/09/2021    Age-related nuclear cataract of left eye 10/15/2021    Combined forms of age-related cataract of both eyes 10/15/2021    Ventral hernia without obstruction or gangrene 07/24/2021    Colostomy status (CMS-HCC) 07/24/2021    Status post Jeanette Caprice procedure (CMS-HCC) 06/26/2021    Colostomy in place (CMS-HCC) 06/26/2021    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    s/p Robotic right upper lobectomy with en bloc lower and middle lobe wedge resection, middle lobe wedge resection 03/20/2021     03/20/21  Robotic right upper lobectomy with en bloc lower and middle lobe wedge resection, middle lobe wedge resection ([facility])      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    Gastroesophageal reflux disease, unspecified whether esophagitis present 10/12/2020     Added automatically from request for surgery [id number]      Hoarse voice quality 10/12/2020     Added automatically from request for surgery [id number]      Cavitary lesion of lung 07/24/2020    Acquired tracheobronchomegaly with bronchiectasis (CMS-HCC) 05/22/2020    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       Past Medical History:   Diagnosis Date    Acquired tracheobronchomegaly  with bronchiectasis (CMS-HCC) 05/22/2020    Cavitary lesion of lung 07/24/2020    Depression     DM (diabetes mellitus) (CMS-HCC)     Gastroesophageal reflux disease, unspecified whether esophagitis present 10/12/2020    Added automatically from request for surgery [id number]    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)     s/p Robotic right upper  lobectomy with en bloc lower and middle lobe wedge resection, middle lobe wedge resection 03/20/2021    03/20/21 Robotic right upper lobectomy with en bloc lower and middle lobe wedge resection, middle lobe wedge resection ([facility])       Past Surgical History:   Procedure Laterality Date    BRONCHOSCOPY  12/2020    CESAREAN SECTION, LOW TRANSVERSE      COLOSTOMY      CRANIOTOMY      LUNG LOBECTOMY         Family History   Problem Relation Name Age of Onset    Cancer Mother Prescilla Sours ngs and throat    Heart Disease Mother Eliane Decree         Congrestive heart failure due to cancer    Hypertension Mother Eliane Decree     Psychiatry Mother Eliane Decree         Bi polar, manic depressive    Cancer Father Warren Danes         Prostate    Diabetes Father Warren Danes     Stroke Father Warren Danes     Stroke M Grandmother Tindall     Cancer Brother Dian Situ         Skin (many locations)    Diabetes Brother Dian Situ        Social history:  Social History     Socioeconomic History    Marital status: Widowed   Tobacco Use    Smoking status: Never    Smokeless tobacco: Never   Vaping Use    Vaping Use: Never used   Substance and Sexual Activity    Alcohol use: Yes     Comment: glass of wine every other month    Drug use: Not Currently    Sexual activity: Not Currently   Social Activities of Daily Living Present    Military Service No    Blood Transfusions No    Caffeine Concern No    Occupational Exposure No    Hobby Hazards Yes     Comment: Gardening, MAC    Sleep Concern No    Stress Concern Yes     Comment: Husband death Cov19, 2020, sold home, rehomed pets. [place]    Weight Concern No    Special Diet Yes    Back Care No    Exercises Regularly Yes    Bike Helmet Use No    Seat Belt Use No    Performs Self-Exams No   Social History Narrative    Lives with brother Jeneen Rinks at home. Sister also lives in [place]     Alcohol Use: Not on file       Smoking intervention(s): not applicable (patient does not  smoke).    Allergies:  Allergies   Allergen Reactions    Walnuts Swelling     Tongue swollen and burn  Current Outpatient Medications   Medication Sig    ARIKAYCE 590 MG/8.4ML SUSP Inhale the contents of one vial (541m) via Lamira device 1 time daily as directed.    atorvastatin (LIPITOR) 10 MG tablet Take 1 tablet (10 mg) by mouth daily.    azithromycin (ZITHROMAX) 250 MG tablet TAKE 1 TABLET BY MOUTH  DAILY    CLOFAZIMINE IRB 21-0063 50 MG CAPSULE Take 2 capsules (1055m by mouth once daily with food as directed.    CLOFAZIMINE IRB 21-0063 50 MG CAPSULE Take 2 capsules (10037mby mouth once daily with food as directed.    ethambutol (MYAMBUTOL) 400 MG tablet TAKE 2 TABLETS BY MOUTH  DAILY    ferrous sulfate 325 (65 Fe) MG tablet Take 1 tablet (325 mg) by mouth 3 times daily.    fluoxetine (PROZAC) 40 MG capsule Take 1 capsule (40 mg) by mouth daily.    ketOROLAC (ACULAR) 0.5 % ophthalmic solution 1 drop to surgical eye bid  Start 2 days before surgery    metFORMIN (GLUCOPHAGE) 500 mg tablet Take 1 tablet (500 mg) by mouth 2 times daily (with meals).    moxifloxacin (VIGAMOX) 0.5 % ophthalmic solution 1 drop to the surgical eye qid. Start 2 days before surgery    neomycin 500 MG tablet Take 2 tablets (1000 mg) by mouth at noon and 2 tablets (1000 mg) at 8 PM on the day prior to surgery.    omeprazole (PRILOSEC) 20 MG capsule TAKE 1 CAPSULE BY MOUTH IN  THE MORNING BEFORE  BREAKFAST    Pediatric Multivitamins-Fl (MULTIVITAMINS/FL PO)     prednisoLONE acetate (PRED FORTE) 1 % ophthalmic suspension 1 drop to surgical eye qid. Start after surgery. MD to taper    ramipril (ALTACE) 2.5 MG capsule Take 1 capsule (2.5 mg) by mouth daily.    rifAMPin (RIFADIN) 300 MG capsule TAKE 2 CAPSULES BY MOUTH  DAILY    sodium chloride 7 % NEBU USE 1 VIAL VIA NEBULIZER  EVERY 12 HOURS    VENTOLIN HFA 108 (90 Base) MCG/ACT inhaler USE 2 INHALATIONS BY MOUTH  EVERY 6 HOURS AS NEEDED FOR WHEEZING     Current Facility-Administered  Medications   Medication    lidocaine (XYLOCAINE) 2 % viscous solution 1 mL    lidocaine (XYLOCAINE) 4 % topical     Facility-Administered Medications Ordered in Other Visits   Medication    diphenhydrAMINE (BENADRYL) tablet or capsule 25 mg          Last reviewed on 12/18/2021  3:17 PM by SanJoellyn Haffe     Opioid Use Assessment:  Pursuant to Health and Safety Code section 11165.4(e), beginning Oct. 2, 2011950ll DEA-licensed prescribers must consult CURES before prescribing a Schedule II, III or IV controlled substance.  Is the patient on opioids: No    Diet:  Within the past 12 months, were you worried that your food would run out before you got the money to buy more? Never true  Within the past 12 months, the food you bought just didn't last and you didn't have money to get more? Never true  Physical Activity:  On average, How many days a week do you engage in moderate to strenuous exercise: 5 days  On average, how many minutes per session do you engage in exercise at this level?: 60 min  Do you have pain that interferes with performing desired activites? No    Level of functional ability:  Activities of daily living:  Basic:  Do you need help eating, bathing, dressing, or getting around in your home?: No     Instrumental:   Do you need help using a telephone?: No   Can you shop for groceries or clothes without help?: Yes  Can you get places out of walking distance without help? For example can you travel alone by bus, taxi, or drive your own car?: Yes  Can you do your own housework without help? (!) No - due to hernia/colostomy, difficult to bend over; has a housecleaning person  Do you ever have problems remembering to take any of your chronic medications? Do you ever have problems remembering to take any of your chronic medications?: No  Can you handle your own money without help?  Yes    Level of safety:  Home Safety:   Do you have a working smoke alarm in your home?: Yes  Does your home have loose rugs  in the hallway?: No  Does your home have adequate lighting?: Yes  Does your home have grab bars in the bathroom?: (!) No  Does your home have handrails on the stairs?: N/A  What is your living arrangement? Other (Comment)  In the past 6 months, have you experienced leaking of urine?: (!) Yes  Do you find yourself asking people to repeat themselves? No  Do you have difficulty hearing? No  Do you have difficulty driving, watching television, reading, or doing any of your daily activities because of your eyesight? No    Fall risk:   Have you fallen 2 or more times in the past year? No  Do you feel dizzy when you get up from a bed or chair? No  Do you feel unsteady when you walk? No     Dementia Screening  Response provided by: Patient  Problems with judgment (e.g., problems making decisions, bad financial decisions, problems with thinking) No, No Change   Less interest in hobbies/activities No, No Change   Repeats the same things over and over (questions, stories, or statements) No, No Change   Trouble learning how to use a tool, appliance, or gadget (e.g., VCR, computer, microwave, remote control) Yes, A Change   Forgets correct month or year No, No Change   Trouble handling complicated financial affairs (e.g., balancing checkbook, income taxes, paying bills) No, No Change   Trouble remembering appointments  No, No Change   Daily problems with thinking and/or memory No, No Change   Total AD8 Score 2   0 - 1: Normal cognition    Depression Evaluation:  During the past month, have you been bothered by feeling down, depressed or hopeless?    During the past month, have you been bothered by little interest or pleasure in doing things?    Last PHQ Score: 2 (07/25/2021  9:25 AM)     Social/Emotional Support:  Do you usually get the social and emotional support that you need?: Yes    Health Risk Assessment (HRA) reviewed in its entirety with patient face-to-face during visit. HRA Reviewed with patient. Discussed all abnormal  results with patient.     Vitals recorded in today's visit:  BP 123/68 (BP Location: Left arm, BP Patient Position: Sitting, BP cuff size: Regular)   Pulse 78   Temp 98.4 F (36.9 C) (Temporal)   Ht 5' 2"  (1.575 m)   Wt 64.4 kg (142 lb)   SpO2 99%   BMI 25.97 kg/m   Obesity intervention(s): dietary counseling and exercise counseling.  Hearing and Vision  Screen:  No results found.  General Appearance: well-appearing, alert, no distress, pleasant  Heart:  normal rate and regular rhythm, no murmurs, clicks, or gallops.  Lungs: clear to auscultation. No w/c/r      The 10-year ASCVD risk score (Arnett DK, et al., 2019) is: 40.9%    Values used to calculate the score:      Age: 49 years      Sex: Female      Is Non-Hispanic African American: No      Diabetic: Yes      Tobacco smoker: No      Systolic Blood Pressure: 829 mmHg      Is BP treated: Yes      HDL Cholesterol: 53 mg/dL      Total Cholesterol: 163 mg/dL    Current providers and suppliers providing medical care to patient:  Patient Care Team:  Alton Revere Ginny Forth, MD as PCP - General  Additional external providers: Dr. Charlynn Court    Advance Care Planning     Is there anything else you would like [place] to know about you as a person to give you the best possible care? Pt does not consult a doctor unless she is willing to follow directions.  Who would you want to be your Health Care Agent? Brother or sister's which are already designated on ER contacts.   Updated [place]? Yes  Last Advance Directive Review Date: 12/18/2021  Advance Directive or Health Care Agent reviewed today: Advance Directive discussed, patient unable to provide Advance Directive or [place] today because she plans to fill out the form with family. Packet provided today.[name]  POLST: will review at a later date  Would you like further information or assistance with Advance Care Planning? Yes, advised to fill out a PrepareForYourCare  Advance Directive       A/P:    Juliona was seen today for wellness visit.    Diagnoses and all orders for this visit:    Type 2 diabetes mellitus without complication without long term insulin use (CMS-HCC)  - Regimen: Metformin 500 mg BID  - A1c 6.8% in 06/2021, repeat A1c ordered  Microvascular complications:  - 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/2021, no DR; cataracts removed  - Diabetic nephropathy: Last urine micro albumin - Ordered  - Diabetic neuropathy: Last monofilament - normal foot exam in office today, 12/18/2021  Macrovascular complications:  - Peripheral vascular disease: N/A  - Coronary artery disease: N/A - Takes Ramipril, Atorvastatin  - Cerebrovascular events: N/A    Urinary incontinence, unspecified type  Suspect largely driven by stress incontinence given worsening symptoms with vagal maneuvers. Ddx includes overactive bladder. No reported urinary retention. Remains hesitant about starting new medications.  - Referral provided for pelvic floor therapy  - Continue scheduling timed voids every 3-4 hours  - UA ordered    Screened negative for alcohol use  Screened negative for drug use      I have performed a comprehensive assessment of risk factors appropriate to pt's age, reviewed and reconciled all current medications and supplements, and counseled on anticipatory guidance and risk factor reduction. Upcoming health maintenance schedule reviewed face to face and a written copy was provided on today's after visit summary.    [facility]   Health Maintenance   Topic Date Due    Diabetes Urine Microalbumin  Never done    Colorectal Cancer Screening  Never done    Tetanus (  1 - Tdap) Never done    Shingles Vaccine (1 of 2) Never done    Advance Care Planning  Never done    Medicare Annual Wellness Visit  Never done    PHQ9 Depression Monitoring doc flowsheet  09/22/2021    COVID-19 Vaccine (7 - 2023-24 season) 12/04/2021    Diabetes A1c  12/26/2021    Diabetic Foot Exam  04/26/2022    LDL Monitoring   05/17/2022    Diabetic Retinal Exam  06/12/2022    Diabetes CMP  11/27/2022    Osteoporosis Screening  05/19/2026    Hepatitis C Screening  Completed    Influenza  Completed    Pneumococcal Vaccine  Completed    Polio Vaccine  Aged Out    IMM_Hep A Vaccine Series  Aged Out    HPV Vaccine <= 93 Yrs  Aged Out    Meningococcal MCV4 Vaccine  Aged Out    Breast Cancer Screen  Discontinued    Lipid Screening  Discontinued       Patient's next primary care follow up is recommended in 3 months.  The annual wellness visit is given every 12 months and will be recommended for 1 year from today.   Return in about 3 months (around 03/19/2022).    Patient Instruction:  See Patient Education/Instruction section.      Electronically signed by:  Shellia Cleverly, MD    Patient seen and discussed with Dr. Charlynn Court.

## 2021-12-18 NOTE — Patient Instructions (Addendum)
Here is what we discussed today:  - Call the [facility] clinic at [phone] to set up a lab appointment, or you can head over to any  lab draw site: [email or web address]  - I will contact you on MyChart about your results  - Referral provided for pelvic physical therapy  - Schedule a follow-up appointment with me at the front desk in 3 months

## 2021-12-18 NOTE — Progress Notes (Addendum)
Chief Complaint   Patient presents with    Wellness Visit     Discuss metformin ?   Urine leaking x4 months        SUBJECTIVE:   I reviewed the history and interviewed the patient.     History of present illness: Tiffany Chen is a 76 year old female who presents for above concern.     Past Medical, Family, Social History: Reviewed and updated as per resident note.     OBJECTIVE:    BP 123/68 (BP Location: Left arm, BP Patient Position: Sitting, BP cuff size: Regular)   Pulse 78   Temp 98.4 F (36.9 C) (Temporal)   Ht 5' 2"$  (1.575 m)   Wt 64.4 kg (142 lb)   SpO2 99%   BMI 25.97 kg/m      I have examined the patient and I concur with the resident's exam.     MEDICAL PLAN of CARE:   Assessment and plan reviewed with the resident physician.  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  I agree with the resident's plans as documented.  See the resident's note for further details.

## 2021-12-19 ENCOUNTER — Encounter (HOSPITAL_BASED_OUTPATIENT_CLINIC_OR_DEPARTMENT_OTHER): Payer: Self-pay | Admitting: Critical Care Medicine

## 2021-12-19 ENCOUNTER — Ambulatory Visit: Payer: Medicare Other | Attending: Critical Care Medicine | Admitting: Critical Care Medicine

## 2021-12-19 VITALS — BP 123/63 | HR 75 | Temp 97.2°F | Resp 18 | Ht 62.0 in | Wt 140.0 lb

## 2021-12-19 DIAGNOSIS — A319 Mycobacterial infection, unspecified: Secondary | ICD-10-CM | POA: Insufficient documentation

## 2021-12-19 DIAGNOSIS — R251 Tremor, unspecified: Secondary | ICD-10-CM | POA: Insufficient documentation

## 2021-12-19 DIAGNOSIS — I499 Cardiac arrhythmia, unspecified: Secondary | ICD-10-CM | POA: Insufficient documentation

## 2021-12-19 DIAGNOSIS — J45901 Unspecified asthma with (acute) exacerbation: Secondary | ICD-10-CM | POA: Insufficient documentation

## 2021-12-19 MED ORDER — PREDNISONE 20 MG OR TABS
40.0000 mg | ORAL_TABLET | Freq: Every day | ORAL | 0 refills | Status: AC
Start: 2021-12-19 — End: 2021-12-24

## 2021-12-19 MED ORDER — UMECLIDINIUM-VILANTEROL 62.5-25 MCG/ACT IN AEPB
1.0000 | INHALATION_SPRAY | Freq: Every day | RESPIRATORY_TRACT | 11 refills | Status: AC
Start: 2021-12-19 — End: ?

## 2021-12-19 NOTE — Patient Instructions (Addendum)
-  continue to take the MAC Antibiotics (azithromycin, rifampin, ethambutol, inhaled arikayce and clofazimine). If the January sputum inductions are negative we will hopefully be able to stop the antibiotics summer of 2024.   - take prednisone 31m daily for 5 days  - start the new inhaler once daily every day. Use the albuterol 2 puffs as needed for chest tightness or shortness of breath  -continue the nebulized saline to clean your lungs  -keep up the physical activity-this is a natural way to clean your lungs  - please call to schedule an appointment with neurology clinic to evaluate the tremor       Continue the monitoring and testing while on the MAC antibiotics  -blood labs monthly. Due this month.   -thank you for scheduling the sputum inductions in January  -EKG-every 3-4 months. Schedule one for February  -Audiogram scheduled Feb 22.   -[facility]opaEye Color Discrimination Testing each eye at home twice a week  -follow up with my partner, Dr. SConley Canalin 2 months and with me in 4 months

## 2021-12-19 NOTE — Progress Notes (Signed)
Pulmonary Clinic Note    This patient was referred by Jonette Mate, MD  [address]  [place],  [place] [address]    Chief Complaint:  Tiffany Chen is a 76 year old female who is in the clinic today with a chief complaint of follow up.    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     PMH of bronchiectasis, cavitary MAC lung infection, ruptured diverticulitis S/P colostomy in 2009, diabetes, ICH S/P embolization.     MAC history- treatment courses were all azithromycin 560m, Rifampin 6080m, ethambutol 160044mhree times weekly:   - 2018- 01/19/2018  - 05/25/2018- 10/22/2019  - 12/22/2019- 02/16/2020.     She was being treated by Dr. ChoCarola Frostr MAC lung infection. She reports that daily dosing of medication and adding a 4th antibiotic were not discussed before 2022, despite her disease being cavitary.     In late 2021, CT scan showed enlarging cavity. December 2021 a bronch was performed but no MAC growth. Her doctor restarted azithro/Rifampin/ethmabutol based on imaging.   Feb 2022 she saw [name] stopped the antibiotics based on the lack of microbiologic data. Performed repeat bronch 04/28/2020 which has smear AFB 3+ and culture MAC (drug susc pending)     Over the last several months her symptoms have consisted of significant daily coughing, causing night time awakening most nights, night sweats every night, and weight loss >30lbs. Also significant fatigue. Dr. AshCecile Hearingescribed nebulized saline but she did not start it.   She tried to walk several miles each day despite her fatigue.     Significant heartburn if she lays shortly after eating.     Miscellaneous:   Never smoker.   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   Her husband died after contracting COVID in the hospital.   Moved in with [place] brother in [place] ago.   Her brother is Vegan.   No hot tubs, no swimming pools.     Last visit  08/15/2021 with me and 10/16/2021 with NP PenEarlie ServerContinued azithromycin 250m26mily, rifampin 600mg16mly, Ethambutol 800mg 72my. Inhaled Arikayce daily (replaced the amikacin IV in early Nov 2022). Added clofazimine 100mg d64m on 02/21/2021. S/P lobectomy in 03/20/2021 by CT surgery for removal of the cavitary lesions: (S/P DA VINCI XI-Right Robotic upper lobectomy with middle and lower  lobe wedge resection).   Before the surgery she had had a significant improvement in energy levels and her night sweats had resolved/ Coughing had decreased significantly. She hardly coughs Weight is stable.    Had cataract surgery recently 11/2021, went extremely well.   Had swallow eval: "Trace vallecular residue with larger cup sips thin liquid, clears with repeat swallow. Minimal osteophytes observed at C4-C5 with correlating minimal CP bar that did not occlude bolus passage. Query ?dysmotility in the thoracic esophagus and decreased clearance through the UES - no overt retroflow to pharynx however recommend f/u with GI given reflux Hx and possible role reflux can play in aspiration after the swallow. "    Has a cough that comes and goes that started a couple months ago, noticed it increase with the cold weather. Thinks it different in character than when she had the MAC in that its not the same harsh cough. Walking 5 days a week 1.5-2 miles and notes coughing. Uses albuterol when using arikayce  and using at night because of feeling tired and makes breathing easier.   Having continued increased energy and she thinks her weight is increasing in a good way.     Following with surgery who are planning am ostomy take-down. Has follow up on 12/28/21.       ROS as above, otherwise negative.     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)    Mycobacterium avium complex (CMS-HCC)    s/p Robotic right upper lobectomy with en bloc lower and middle lobe wedge resection, middle lobe wedge resection    Status post Hartmann procedure (CMS-HCC)    Colostomy in place (CMS-HCC)    Ventral hernia without obstruction or gangrene    Colostomy status (CMS-HCC)    Age-related nuclear cataract of left eye    Combined forms of age-related cataract of both eyes    Status post cataract extraction and insertion of intraocular lens of right eye    Status post cataract extraction and insertion of intraocular lens of left eye    Type 2 diabetes mellitus with other specified complication, unspecified whether long term insulin use (CMS-HCC)     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     Past Surgical History:   Procedure Laterality Date    BRONCHOSCOPY  12/2020    CESAREAN SECTION, LOW TRANSVERSE      COLOSTOMY      CRANIOTOMY      LUNG LOBECTOMY       Allergies   Allergen Reactions    Walnuts Swelling     Tongue swollen and burn     Current Outpatient  Medications on File Prior to Visit   Medication Sig Dispense Refill    ARIKAYCE 590 MG/8.4ML SUSP Inhale the contents of one vial (525m) via Lamira device 1 time daily as directed. 28 each 5    atorvastatin (LIPITOR) 10 MG tablet Take 1 tablet (10 mg) by mouth daily. 90 tablet 2    azithromycin (ZITHROMAX) 250 MG tablet TAKE 1 TABLET BY MOUTH  DAILY 60 tablet 11    CLOFAZIMINE IRB 21-0063 50 MG CAPSULE Take 2 capsules (1044m by mouth once daily with food as directed. 200 capsule 0    CLOFAZIMINE IRB 21-0063 50 MG CAPSULE Take 2 capsules (10060mby mouth once daily with food as directed. 200 capsule 0    ethambutol (MYAMBUTOL) 400 MG tablet TAKE 2 TABLETS BY MOUTH  DAILY 120 tablet  11    ferrous sulfate 325 (65 Fe) MG tablet Take 1 tablet (325 mg) by mouth 3 times daily.      fluoxetine (PROZAC) 40 MG capsule Take 1 capsule (40 mg) by mouth daily. 90 capsule 3    ketOROLAC (ACULAR) 0.5 % ophthalmic solution 1 drop to surgical eye bid  Start 2 days before surgery 5 mL 3    metFORMIN (GLUCOPHAGE) 500 mg tablet Take 1 tablet (500 mg) by mouth 2 times daily (with meals).      moxifloxacin (VIGAMOX) 0.5 % ophthalmic solution 1 drop to the surgical eye qid. Start 2 days before surgery 5 mL 3    neomycin 500 MG tablet Take 2 tablets (1000 mg) by mouth at noon and 2 tablets (1000 mg) at 8 PM on the day prior to surgery. 4 tablet 0    omeprazole (PRILOSEC) 20 MG capsule TAKE 1 CAPSULE BY MOUTH IN  THE MORNING BEFORE  BREAKFAST 90 capsule 3    Pediatric Multivitamins-Fl (MULTIVITAMINS/FL PO)       prednisoLONE acetate (PRED FORTE) 1 % ophthalmic suspension 1 drop to surgical eye qid. Start after surgery. MD to taper 5 mL 3    ramipril (ALTACE) 2.5 MG capsule Take 1 capsule (2.5 mg) by mouth daily. 90 capsule 1    rifAMPin (RIFADIN) 300 MG capsule TAKE 2 CAPSULES BY MOUTH  DAILY 120 capsule 11    sodium chloride 7 % NEBU USE 1 VIAL VIA NEBULIZER  EVERY 12 HOURS 720 mL 3    VENTOLIN HFA 108 (90 Base) MCG/ACT inhaler USE 2  INHALATIONS BY MOUTH  EVERY 6 HOURS AS NEEDED FOR WHEEZING 72 g 3     Current Facility-Administered Medications on File Prior to Visit   Medication Dose Route Frequency Provider Last Rate Last Admin    diphenhydrAMINE (BENADRYL) tablet or capsule 25 mg  25 mg Oral Once PRN Courtney Heys, MD   25 mg at 12/17/21 0907    lidocaine (XYLOCAINE) 2 % viscous solution 1 mL  1 mL Other PRN Broderick, Ethelle Lyon, MD        lidocaine (XYLOCAINE) 4 % topical   Other PRN Diona Browner, MD         Family History   Problem Relation Name Age of Onset    Cancer Mother Prescilla Sours ngs and throat    Heart Disease Mother Eliane Decree         Congrestive heart failure due to cancer    Hypertension Mother Eliane Decree     Psychiatry Mother Eliane Decree         Bi polar, manic depressive    Cancer Father Warren Danes         Prostate    Diabetes Father Warren Danes     Stroke Father Warren Danes     Stroke M Grandmother [place] Brother Dian Situ         Skin (many locations)    Diabetes Brother Dian Situ      Social History     Socioeconomic History    Marital status: Widowed   Tobacco Use    Smoking status: Never    Smokeless tobacco: Never   Vaping Use    Vaping Use: Never used   Substance and Sexual Activity    Alcohol use: Yes     Comment: glass of wine every other month  Drug use: Not Currently    Sexual activity: Not Currently   Other Topics Concern    Military Service No    Blood Transfusions No    Caffeine Concern No    Occupational Exposure No    Hobby Hazards Yes     Comment: Gardening, MAC    Sleep Concern No    Stress Concern Yes     Comment: Husband death Cov19, 2020, sold home, rehomed pets. [place]gerie    Weight Concern No    Special Diet Yes    Back Care No    Exercises Regularly Yes    Bike Helmet Use No    Seat Belt Use No    Performs Self-Exams No   Social History Narrative    Lives with brother Jeneen Rinks at home. Sister also lives in [place]: No Food Insecurity (12/17/2021)    Hunger Vital Sign     Worried About Running Out of Food in the Last Year: Never true     Ran Out of Food in the Last Year: Never true   Physical Activity: Sufficiently Active (12/17/2021)    Exercise is Medicine (EIM)     Days of Exercise per Week: 5 days     Minutes of Exercise per Session: 60 min   Intimate Partner Violence: Low Risk  (11/26/2021)    Boon IPV     Have you ever been emotionally or physically abused by your partner or someone important to you?: No     Within the last year have you been hit, slapped, kicked or otherwise physically hurt by someone?: No     Since you've been pregnant, have you been slapped, kicked or otherwise physically hurt by someone?: N/A     Within the last year has anyone forced you to have sexual activities?: No     Are you afraid of your spouse or partner you listed above?: No       Examination:  Vitals:    12/19/21 0932   BP: 123/63   BP Location: Left arm   BP Patient Position: Sitting   BP cuff size: Regular   Pulse: 75   Resp: 18   Temp: 97.2 F (36.2 C)   TempSrc: Temporal   SpO2: 95%   Weight: 63.5 kg (140 lb)   Height: _0  (1.575 m)       General: The patient is well appearing, sitting comfortably on the examination table with no evidence of respiratory distress.  Heart: regular rate and rhythm, no murmurs rubs or gallops, normally split S2, no accentuation of P2  Lungs: BL end expiratory wheezes throughout, purse lip breathing with deep breaths.   Skin: no rashes  Neuro: Slight tremor in the left fingers at rest    Laboratory Review:  Lab Results   Component Value Date    WBC 4.4 11/26/2021    HGB 12.5 11/26/2021    HCT 38.1 11/26/2021    PLT 209 11/26/2021     Lab Results   Component Value Date    NA 139 11/26/2021    K 4.4 11/26/2021    CL 104 11/26/2021    BICARB 24 11/26/2021    BUN 23 11/26/2021    CREAT 0.79 11/26/2021    GLU 115 (H) 11/26/2021    Nora 9.6 11/26/2021        Pulmonary Function Test Review:  04/28/2020  (care everywhere): noraml  spito, normal volumes, mildly reduced DLCO    Radiology Review:  CT chest 04/13/2020:   CONCLUSION:   1. 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   2. Cavitary lesion in the periphery of the right lung has increased in size measuring 2.7 cm, although may have a slightly less thickened and nodular wall.   3.  Diffuse bronchiectasis most pronounced in the right upper lobe.     CT chest 06/22/2021:   IMPRESSION:  1. Post interval right upper lobectomy with a small residual right hydropneumothorax.     2. Scattered tree-in-bud airspace opacities throughout the lungs, similar to slightly improved.  Previously seen patchy groundglass opacities within the lungs appear improved.  3. Mediastinal lymphadenopathy.  This appears slightly increased in comparison to the prior study.    Microbiology  BAL 04/28/2020 (from Dr. Cecile Hearing): AFB smear 3+, culture MAC (drug susceptibly testing sent to [facility]- pending)    Sputum - AFB  08/01/20: AFB smear negative, Cx MAC  08/03/20: AFB smear 1+, Cx MAC (MIS clarithro 0.5, amikacin 4)  08/07/20: AFB smear negative, Cx MAC  10/25: AFB smear 1+, Cx MAC  11/1, 11/09/2020: AFB smear negative, Cx MAC  12/25/2020: AFB smear 2+, Culture MAC  01/30/2021: AFB smear and culture negative  02/06/2021: AFB smear negative, culture heavy MAC  02/12/2021: AFB smear and culture negative  02/15/2021: AFB smear negative, culture moderate MAC  08/28/2021: AFB smear negative, culture negative  08/31/2021: AFB smear negative, culture negative      Sputum - Routine   08/01/20: NL Resp Flora   08/03/20: NL Resp Flora  08/07/20: NL Resp Flora    EKG   08/23/2020: sinus rhythm with PAC     Impression/Plan:    Problem List:   - cavitary MAC lung infection. 08/14/2020 started azithro/rifampin/ethambutol PLUS Intravenous amikacin. Amikacin IV replaced by inhaled Arikayce early Novermber 2022. Clofazimine added  02/21/2021  - S/P DA VINCI XI-Right Robotic upper lobectomy with middle and lower  lobe wedge resection on 03/20/2021.   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 Amikacin IV replaced by inhaled Arikayce early Novermber 2022. Clofazimine added 02/21/2021  - For reactive airway exacerbation: start anoro ellipta daily, start prednisone 40 mg daily x 5 days, cont albuterol PRN SOB/chest tightness  - Follow up with neurology to discuss tremor, may affect abx duration if felt to be a concerning side effect  - 3 sputum inductions scheduled in January. If cultures negative, will plan on stopping medications summer 2024.   - will plan repeat CT scan close to end of treatment  - referred to pulmonary rehab to assist with recovery from lobectomy. She attended a few sessions then canceled. Preferred to walk daily outdoors.   - EGD with [facility] 02/01/2021 showed significant reflux. Referred to Gen surgery for consideration of surgical intervention to decreae risk of recurrent lung infections.    > Still recommend hiatal hernia repair and fundoplication at time of open ventral hernia repair/ostomy takedown due to possible MAC reinfection from stomach/reflux in relation to hiatal hernia.  - follow up with Allergy/immunology (Dr. 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 Encouraged exercise.   - Follow up with Dr. Conley Canal 03/06/2022 and [name] 04/17/2022      Patient Instructions   -continue to take the MAC Antibiotics  (azithromycin, rifampin, ethambutol, inhaled arikayce and clofazimine). If the January sputum inductions are negative we will hopefully be able to stop the antibiotics summer of 2024.   - take prednisone 64m daily for 5 days  - start the new inhaler once daily every day. Use the albuterol 2 puffs as needed for chest tightness or shortness of breath  -continue the nebulized saline to clean your lungs  -keep up the physical activity-this is a natural way to clean your lungs  - please call to schedule an appointment with neurology clinic to evaluate the tremor       Continue the monitoring and testing while on the MAC antibiotics  -blood labs monthly. Due this month.   -thank you for scheduling the sputum inductions in January  -EKG-every 3-4 months. Schedule one for February  -Audiogram scheduled Feb 22.   -Joyce CopaEye Color Discrimination Testing each eye at home twice a week  -follow up with my partner, Dr. SConley Canalin 2 months and with me in 4 months      Orders:  Orders Placed This Encounter   Procedures    CBC w/ Diff Lavender    Comprehensive Metabolic Panel     Orders Placed This Encounter   Procedures    CT Chest W/O Contrast     No orders of the defined types were placed in this encounter.    Patient seen and discussed with attending, Dr. WKatheren Shams who agrees with the plan above.     JLaverle Patter MD  Pulmonary and Critical Care Medicine, Fellow

## 2021-12-19 NOTE — Progress Notes (Signed)
PCCM Attending: patient seen and examined. Labs and imaging reviewed. Agree with the assessment and plan as outlined in the trainee's note. All the decision making and plan outlined in the trainee's note was discussed extensively and approved with me.     Total time spent 40min:   - with patient, examining, history taking, planning management, counseling  - reviewing images  - records review  - interpretation of results  - documentation    Yakov Bergen, MD

## 2021-12-21 ENCOUNTER — Telehealth (INDEPENDENT_AMBULATORY_CARE_PROVIDER_SITE_OTHER): Payer: Self-pay | Admitting: General Surgery

## 2021-12-21 NOTE — Telephone Encounter (Signed)
V/m to let pt know apt on 12/22 was change to an earlier 9am and also change to VV  Pt to call back and reschedule   because Parking is going to be a real problem next week so just trying to get pts in and out before the commotion on the mall

## 2021-12-25 ENCOUNTER — Other Ambulatory Visit: Payer: Medicare Other | Attending: Critical Care Medicine

## 2021-12-25 DIAGNOSIS — A319 Mycobacterial infection, unspecified: Secondary | ICD-10-CM | POA: Insufficient documentation

## 2021-12-25 DIAGNOSIS — E1169 Type 2 diabetes mellitus with other specified complication: Secondary | ICD-10-CM | POA: Insufficient documentation

## 2021-12-25 DIAGNOSIS — R32 Unspecified urinary incontinence: Secondary | ICD-10-CM | POA: Insufficient documentation

## 2021-12-25 LAB — COMPREHENSIVE METABOLIC PANEL, BLOOD
ALT (SGPT): 29 U/L (ref 0–33)
AST (SGOT): 39 U/L — ABNORMAL HIGH (ref 0–32)
Albumin: 4.1 g/dL (ref 3.5–5.2)
Alkaline Phos: 51 U/L (ref 40–130)
Anion Gap: 7 mmol/L (ref 7–15)
BUN: 17 mg/dL (ref 8–23)
Bicarbonate: 26 mmol/L (ref 22–29)
Bilirubin, Tot: 0.5 mg/dL (ref <1.2)
Calcium: 9.3 mg/dL (ref 8.5–10.6)
Chloride: 106 mmol/L (ref 98–107)
Creatinine: 0.81 mg/dL (ref 0.51–0.95)
Glucose: 101 mg/dL — ABNORMAL HIGH (ref 70–99)
Potassium: 4 mmol/L (ref 3.5–5.1)
Sodium: 139 mmol/L (ref 136–145)
Total Protein: 6.9 g/dL (ref 6.0–8.0)
eGFR Based on CKD-EPI 2021 Equation: >60 mL/min/{1.73_m2}

## 2021-12-25 LAB — CBC WITH DIFF, BLOOD
ANC-Automated: 3 10*3/uL (ref 1.6–7.0)
Abs Basophils: 0 10*3/uL (ref <0.2)
Abs Eosinophils: 0.1 10*3/uL (ref 0.0–0.5)
Abs Lymphs: 1.5 10*3/uL (ref 0.8–3.1)
Abs Monos: 0.5 10*3/uL (ref 0.2–0.8)
Basophils: 1 %
Eosinophils: 1 %
Hct: 37.1 % (ref 34.0–45.0)
Hgb: 12.6 gm/dL (ref 11.2–15.7)
Lymphocytes: 29 %
MCH: 31.3 pg (ref 26.0–32.0)
MCHC: 34 g/dL (ref 32.0–36.0)
MCV: 92.3 um3 (ref 79.0–95.0)
MPV: 10.8 fL (ref 9.4–12.4)
Monocytes: 10 %
Plt Count: 200 10*3/uL (ref 140–370)
RBC: 4.02 10*6/uL (ref 3.90–5.20)
RDW: 14.2 % — ABNORMAL HIGH (ref 12.0–14.0)
Segs: 58 %
WBC: 5.1 10*3/uL (ref 4.0–10.0)

## 2021-12-25 LAB — URINALYSIS WITH CULTURE REFLEX, WHEN INDICATED
Bilirubin: NEGATIVE
Blood: NEGATIVE
Glucose: NEGATIVE
Ketones: NEGATIVE
Leuk Esterase: NEGATIVE Leu/uL
Nitrite: NEGATIVE
Protein: NEGATIVE
Specific Gravity: 1.018 (ref 1.002–1.030)
Urobilinogen: NEGATIVE
pH: 6 (ref 5.0–8.0)

## 2021-12-25 LAB — RANDOM URINE MICROALB/CREAT RATIO PANEL
Creatinine, Urine: 82 mg/dL (ref 29–226)
MALB/CR Ratio Random: UNDETERMINED mcg/mgCr (ref <30)
Microalbumin, Urine: <1.2 mg/dL (ref <2.0)

## 2021-12-25 LAB — GLYCOSYLATED HGB(A1C), BLOOD: Glyco Hgb (A1C): 6.2 % — ABNORMAL HIGH (ref 4.8–5.8)

## 2021-12-26 NOTE — Telephone Encounter (Signed)
Caller: Patient  Relationship to patient: Self  Phone # [phone]  Preferred Method of Communication: Call back    Notes: Pt requesting appt on 12/22 to be converted to a telephone visit. Pt says she has no device for MCVV. Ok to change to telemed?

## 2021-12-26 NOTE — Telephone Encounter (Signed)
Talk to patient she will come to the office to see Dr.Broderick

## 2021-12-28 ENCOUNTER — Encounter (INDEPENDENT_AMBULATORY_CARE_PROVIDER_SITE_OTHER): Payer: Self-pay | Admitting: General Surgery

## 2021-12-28 ENCOUNTER — Ambulatory Visit (INDEPENDENT_AMBULATORY_CARE_PROVIDER_SITE_OTHER): Payer: Medicare Other | Admitting: General Surgery

## 2021-12-28 DIAGNOSIS — K219 Gastro-esophageal reflux disease without esophagitis: Secondary | ICD-10-CM

## 2021-12-28 DIAGNOSIS — K435 Parastomal hernia without obstruction or  gangrene: Secondary | ICD-10-CM

## 2021-12-28 NOTE — Progress Notes (Signed)
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 Berniece Andreas in 03/2021 and there is some concern that the GERD is contributing to the MAC infections.     Preoperative work up completed, patient presents to discuss results of the SLP evaluation, which was normal. Consent for parastomal hernia repair and laparoscopic hiatal hernia repair were signed at last visit in October.    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 Berniece Andreas in 03/2021 for MAC    Allergies  Allergies   Allergen Reactions    Walnuts Swelling     Tongue swollen and burn       Medications  Current Outpatient Medications   Medication Sig Dispense Refill    ARIKAYCE 590 MG/8.4ML SUSP Inhale the contents of one vial (561m) via Lamira device 1 time daily as directed. 28 each 5    atorvastatin (LIPITOR) 10 MG tablet Take 1 tablet (10 mg) by mouth daily. 90 tablet 2    azithromycin (ZITHROMAX) 250 MG tablet TAKE 1 TABLET BY MOUTH  DAILY 60 tablet 11    CLOFAZIMINE IRB 21-0063 50 MG CAPSULE Take 2 capsules (1027m by mouth once daily with food as directed. 200 capsule 0    CLOFAZIMINE IRB 21-0063 50 MG CAPSULE Take 2 capsules (10044mby mouth once daily with food as directed. 200 capsule 0    ethambutol (MYAMBUTOL) 400 MG tablet TAKE 2 TABLETS BY MOUTH  DAILY 120 tablet 11    ferrous sulfate 325 (65 Fe) MG  tablet Take 1 tablet (325 mg) by mouth 3 times daily.      fluoxetine (PROZAC) 40 MG capsule Take 1 capsule (40 mg) by mouth daily. 90 capsule 3    ketOROLAC (ACULAR) 0.5 % ophthalmic solution 1 drop to surgical eye bid  Start 2 days before surgery 5 mL 3    metFORMIN (GLUCOPHAGE) 500 mg tablet Take 1 tablet (500 mg) by mouth 2 times daily (with meals).      moxifloxacin (VIGAMOX) 0.5 % ophthalmic solution 1 drop to the surgical eye qid. Start 2 days before surgery 5 mL 3    neomycin 500 MG tablet Take 2 tablets (1000 mg) by mouth at noon and 2 tablets (1000 mg) at 8 PM on the day prior to surgery. 4 tablet 0    omeprazole (PRILOSEC) 20 MG capsule TAKE 1 CAPSULE BY MOUTH IN  THE MORNING BEFORE  BREAKFAST 90 capsule 3    Pediatric Multivitamins-Fl (MULTIVITAMINS/FL PO)       prednisoLONE acetate (PRED FORTE) 1 % ophthalmic  suspension 1 drop to surgical eye qid. Start after surgery. MD to taper 5 mL 3    ramipril (ALTACE) 2.5 MG capsule Take 1 capsule (2.5 mg) by mouth daily. 90 capsule 1    rifAMPin (RIFADIN) 300 MG capsule TAKE 2 CAPSULES BY MOUTH  DAILY 120 capsule 11    sodium chloride 7 % NEBU USE 1 VIAL VIA NEBULIZER  EVERY 12 HOURS 720 mL 3    umeclidinium-vilanterol (ANORO ELLIPTA) 62.5-25 MCG/ACT inhaler Inhale 1 puff by mouth daily. 60 each 11    VENTOLIN HFA 108 (90 Base) MCG/ACT inhaler USE 2 INHALATIONS BY MOUTH  EVERY 6 HOURS AS NEEDED FOR WHEEZING 72 g 3     Current Facility-Administered Medications   Medication Dose Route Frequency Provider Last Rate Last Admin    lidocaine (XYLOCAINE) 2 % viscous solution 1 mL  1 mL Other PRN Broderick, Ethelle Lyon, MD        lidocaine (XYLOCAINE) 4 % topical   Other PRN Broderick, Ethelle Lyon, MD           Social History  Social History     Socioeconomic History    Marital status: Widowed   Tobacco Use    Smoking status: Never    Smokeless tobacco: Never   Vaping Use    Vaping Use: Never used   Substance and Sexual Activity    Alcohol use: Yes     Comment: glass of wine every other month    Drug use: Not Currently    Sexual activity: Not Currently   Social Activities of Daily Living Present    Military Service No    Blood Transfusions No    Caffeine Concern No    Occupational Exposure No    Hobby Hazards Yes     Comment: Gardening, MAC    Sleep Concern No    Stress Concern Yes     Comment: Husband death Cov19, 2020, sold home, rehomed pets. [place]    Weight Concern No    Special Diet Yes    Back Care No    Exercises Regularly Yes    Bike Helmet Use No    Seat Belt Use No    Performs Self-Exams No   Social History Narrative    Lives with brother Jeneen Rinks at home. Sister also lives in [place] History  Family History   Problem Relation Name Age of Onset  Cancer Mother Prescilla Sours ngs and throat    Heart Disease Mother Eliane Decree         Congrestive heart failure due  to cancer    Hypertension Mother Eliane Decree     Psychiatry Mother Eliane Decree         Bi polar, manic depressive    Cancer Father Warren Danes         Prostate    Diabetes Father Warren Danes     Stroke Father Warren Danes     Stroke M Grandmother [place]     Cancer Brother Dian Situ         Skin (many locations)    Diabetes Brother Dian Situ        Review of Systems    As described in HPI.    Physical Examination  BP 155/72 (BP Location: Left arm, BP Patient Position: Sitting, BP cuff size: Regular)   Pulse 80   Ht _0  (1.575 m)   Wt 64.4 kg (142 lb)   SpO2 96%   BMI 25.97 kg/m  Body mass index is 25.97 kg/m.  GENERAL: alert, oriented x 3, no acute distress   CHEST: unlabored respirations   ABDOMEN: soft, non tender, colostomy in place with large surrounding parastomal hernia, mild ttp on palpation    Diagnostic Testing/Labs:  Lab Results   Component Value Date    WBC 5.1 12/25/2021    RBC 4.02 12/25/2021    HGB 12.6 12/25/2021    HCT 37.1 12/25/2021    MCV 92.3 12/25/2021    MCHC 34.0 12/25/2021    RDW 14.2 (H) 12/25/2021    PLT 200 12/25/2021    MPV 10.8 12/25/2021    SEG 58 12/25/2021    LYMPHS 29 12/25/2021    MONOS 10 12/25/2021    EOS 1 12/25/2021    BASOS 1 12/25/2021     Lab Results   Component Value Date    BUN 17 12/25/2021    CREAT 0.81 12/25/2021    CL 106 12/25/2021    NA 139 12/25/2021    K 4.0 12/25/2021    [place] 9.3 12/25/2021    TBILI 0.50 12/25/2021    ALB 4.1 12/25/2021    TP 6.9 12/25/2021    AST 39 (H) 12/25/2021    ALK 51 12/25/2021    BICARB 26 12/25/2021    ALT 29 12/25/2021    GLU 101 (H) 12/25/2021     Lab Results   Component Value Date    NA 139 12/25/2021    K 4.0 12/25/2021    CL 106 12/25/2021    BICARB 26 12/25/2021    BUN 17 12/25/2021    CREAT 0.81 12/25/2021    GLU 101 (H) 12/25/2021    [place] 9.3 12/25/2021     Lab Results   Component Value Date    A1C 6.2 (H) 12/25/2021     Lab Results   Component Value Date    CHOL 163 05/16/2021    HDL 53 05/16/2021    LDLCALC 81  05/16/2021    TRIG 145 05/16/2021     No results found for: "TSH"      Imaging/Procedures:  Manometry completed 6/27 - normal    Barium swallow scheduled for 8/1  IMPRESSION:  1. No focal abnormality noted along the esophagus.  2. Questionable tiny hiatal hernia. No gastroesophageal reflux was able to be elicited during the  study. Mild esophageal dysmotility.    EGD with [facility]: 13.2% acid exposure with demeesters 40-60.    X-Ray Swallowing Function Cine/Video    Result Date: 12/13/2021   [facility] see speech pathology report for further details.]       Assessment/Plan:  Tiffany Chen is a 76 year old female, Body mass index is 25.97 kg/m., with asymptomatic GERD with history of RUL MAC requiring lobectomy as well as a symptomatic parastomal hernia:.Possible that MAC could be related to reflux, although definitely no guarantee that anti-reflux operation will "cure" her of MAC.  Will plan to perform hiatal hernia repair and fundoplication at time of her open ventral hernia repair/ostomy takedown, consent signed previously.

## 2022-01-10 ENCOUNTER — Ambulatory Visit
Admission: RE | Admit: 2022-01-10 | Discharge: 2022-01-10 | Disposition: A | Discharge status: Home / Self Care | Payer: Medicare Other | Attending: Critical Care Medicine | Admitting: Critical Care Medicine

## 2022-01-10 DIAGNOSIS — J479 Bronchiectasis, uncomplicated: Secondary | ICD-10-CM | POA: Insufficient documentation

## 2022-01-10 DIAGNOSIS — J984 Other disorders of lung: Secondary | ICD-10-CM

## 2022-01-10 DIAGNOSIS — A310 Pulmonary mycobacterial infection: Secondary | ICD-10-CM | POA: Insufficient documentation

## 2022-01-10 NOTE — Interdisciplinary (Signed)
SVN given with 10% Sodium chloride in HFP via HHN x 15 minutes. Pt was unable to produce a specimen at this time. No adverse reaction noted.

## 2022-01-14 ENCOUNTER — Encounter (INDEPENDENT_AMBULATORY_CARE_PROVIDER_SITE_OTHER): Payer: Self-pay | Admitting: Ophthalmology

## 2022-01-14 ENCOUNTER — Ambulatory Visit (INDEPENDENT_AMBULATORY_CARE_PROVIDER_SITE_OTHER): Payer: Medicare Other | Admitting: Ophthalmology

## 2022-01-14 ENCOUNTER — Ambulatory Visit
Admission: RE | Admit: 2022-01-14 | Discharge: 2022-01-14 | Disposition: A | Discharge status: Home / Self Care | Payer: Medicare Other | Attending: Critical Care Medicine | Admitting: Critical Care Medicine

## 2022-01-14 DIAGNOSIS — Z961 Presence of intraocular lens: Secondary | ICD-10-CM

## 2022-01-14 DIAGNOSIS — H43819 Vitreous degeneration, unspecified eye: Secondary | ICD-10-CM | POA: Insufficient documentation

## 2022-01-14 DIAGNOSIS — Z09 Encounter for follow-up examination after completed treatment for conditions other than malignant neoplasm: Secondary | ICD-10-CM

## 2022-01-14 DIAGNOSIS — A310 Pulmonary mycobacterial infection: Secondary | ICD-10-CM | POA: Insufficient documentation

## 2022-01-14 DIAGNOSIS — J984 Other disorders of lung: Secondary | ICD-10-CM | POA: Insufficient documentation

## 2022-01-14 DIAGNOSIS — J479 Bronchiectasis, uncomplicated: Secondary | ICD-10-CM | POA: Insufficient documentation

## 2022-01-14 DIAGNOSIS — H43813 Vitreous degeneration, bilateral: Secondary | ICD-10-CM

## 2022-01-14 NOTE — Assessment & Plan Note (Signed)
Reviewed warning symptoms of retinal detachment.  Follow up for flashing lights, increasing floaters, acute loss of vision, veil/ "curtain"  effect covering vision.

## 2022-01-14 NOTE — Interdisciplinary (Signed)
PFT lab note: sputum induction performed using 10% hypertonic saline. Treatment productive.

## 2022-01-14 NOTE — Progress Notes (Signed)
Pseudophakia of both eyes  Assessment & Plan:  S/P CEIOL OU 11/2021.  Doing well.  Rx reading glasses as requested.  FU 4 months.      Posterior vitreous detachment of both eyes  Assessment & Plan:  Reviewed warning symptoms of retinal detachment.  Follow up for flashing lights, increasing floaters, acute loss of vision, veil/ "curtain"  effect covering vision.          Return in about 4 months (around 05/15/2022).

## 2022-01-14 NOTE — Assessment & Plan Note (Signed)
S/P CEIOL OU 11/2021.  Doing well.  Rx reading glasses as requested.  FU 4 months.

## 2022-01-15 ENCOUNTER — Ambulatory Visit
Admission: RE | Admit: 2022-01-15 | Discharge: 2022-01-15 | Disposition: A | Discharge status: Home / Self Care | Payer: Medicare Other | Attending: Allergy | Admitting: Allergy

## 2022-01-15 ENCOUNTER — Telehealth (INDEPENDENT_AMBULATORY_CARE_PROVIDER_SITE_OTHER): Payer: Self-pay | Admitting: General Surgery

## 2022-01-15 VITALS — BP 138/73 | HR 68 | Temp 97.8°F | Resp 16 | Wt 143.3 lb

## 2022-01-15 DIAGNOSIS — D809 Immunodeficiency with predominantly antibody defects, unspecified: Secondary | ICD-10-CM | POA: Insufficient documentation

## 2022-01-15 LAB — AFB CULTURE W/STAIN: AFB Smear Result: NEGATIVE

## 2022-01-15 MED ORDER — CETIRIZINE HCL 10 MG OR TABS
10.0000 mg | ORAL_TABLET | Freq: Once | ORAL | Status: AC
Start: 2022-01-15 — End: 2022-01-15
  Administered 2022-01-15: 10 mg via ORAL
  Filled 2022-01-15: qty 1

## 2022-01-15 MED ORDER — ACETAMINOPHEN 325 MG PO TABS
650.0000 mg | ORAL_TABLET | Freq: Once | ORAL | Status: AC
Start: 2022-01-15 — End: 2022-01-15
  Administered 2022-01-15: 650 mg via ORAL
  Filled 2022-01-15: qty 2

## 2022-01-15 MED ORDER — IMMUNE GLOBULIN (HUMAN) 20 GM/200ML IJ SOLN
0.5000 g/kg | Freq: Once | INTRAMUSCULAR | Status: AC
Start: 2022-01-15 — End: 2022-01-15
  Administered 2022-01-15: 25 g via INTRAVENOUS
  Filled 2022-01-15: qty 200

## 2022-01-15 MED ORDER — DEXTROSE 5 % IV SOLN
INTRAVENOUS | Status: DC
Start: 2022-01-15 — End: 2022-01-16

## 2022-01-15 NOTE — Telephone Encounter (Signed)
Spoke to patient sent message to Dr. Hosie Spangle scheduler  explained to patient is a combined case and both Surgeons need to be available.

## 2022-01-15 NOTE — Telephone Encounter (Signed)
Caller: Pt   Relationship to patient:  Self   Phone #      [phone]     Provider:  Hillery Hunter   Notes:  Pt calling requesting for a call to schedule sx with dr Otis Brace pt a bit upset pt was hoping to have sx scheduled already , pt requesting for sx in March. I dont see a sx order.     Caller has been advised of  48-72  hr turnaround time.

## 2022-01-15 NOTE — Interdisciplinary (Signed)
Non-Chemotherapy Infusion Nursing Note - Tiffany Chen is a 77 year old female who presents for infusion of IVIG.    Vitals:    01/15/22 0700 01/15/22 0848 01/15/22 1218   BP:  131/71 138/73   BP Location:  Left arm Right arm   BP Patient Position:  Sitting Semi-Fowlers   Pulse:  81 68   Resp:  16 16   Temp:  96 F (35.6 C) 97.8 F (36.6 C)   TempSrc:  Temporal Temporal   SpO2:  98% 97%   Weight: 65 kg (143 lb 4.8 oz)         Pre-treatment nursing assessment:  No problems identified upon assessment.  Patient arrived ambulatory in stable condition.   Denies pain.   Denies nausea or vomiting.   Denies diarrhea or constipation.   Denies fevers or recent illness.   Pt verbalizes tolerance with medications.    Medications   dextrose 5% infusion (0 mL IntraVENOUS Stopped 01/15/22 1214)   acetaminophen (TYLENOL) tablet 650 mg (650 mg Oral Given 01/15/22 0852)   cetirizine (ZYRTEC) tablet 10 mg (10 mg Oral Given 01/15/22 0852)   immune globulin (human) (IgG) 25 g in 250 mL (GAMUNEX-C) infusion (0 g IntraVENOUS Completed 01/15/22 1209)        [name] tolerated treatment well.    Post blood return: Brisk  Post-Flush: NS    Patient Education  Learner: Patient  Barriers to learning: No Barriers  Readiness to learn: Acceptance  Method: Explanation    Treatment Education: Information/teaching given to patient including: signs and symptoms of infection, bleeding, adverse reaction(s), symptom control, and when to notify MD.    [name] Prevention Education: Instructed patient to call for assistance.    Pain Education: Patient instructed to contact nurse if pain should develop or if their current pain therapy becomes ineffective.    Response: Verbalizes understanding    Discharge Plan  Discharge instructions given to patient.  Future appointments given and reviewed with treatment plan.  Discharge Mode: Ambulatory  Discharge Time: 1219  Accompanied by: Self  Discharged To: Home

## 2022-01-16 ENCOUNTER — Ambulatory Visit (HOSPITAL_COMMUNITY): Payer: Medicare Other

## 2022-01-17 LAB — RESPIRATORY CULTURE W/GRAM STAIN: Respiratory Culture Result: NORMAL

## 2022-01-23 ENCOUNTER — Ambulatory Visit
Admission: RE | Admit: 2022-01-23 | Discharge: 2022-01-23 | Disposition: A | Discharge status: Home / Self Care | Payer: Medicare Other | Attending: Critical Care Medicine | Admitting: Critical Care Medicine

## 2022-01-23 DIAGNOSIS — J479 Bronchiectasis, uncomplicated: Secondary | ICD-10-CM

## 2022-01-23 DIAGNOSIS — J984 Other disorders of lung: Secondary | ICD-10-CM

## 2022-01-23 DIAGNOSIS — A310 Pulmonary mycobacterial infection: Secondary | ICD-10-CM

## 2022-01-26 LAB — RESPIRATORY CULTURE W/GRAM STAIN: Respiratory Culture Result: NORMAL

## 2022-01-30 ENCOUNTER — Ambulatory Visit (INDEPENDENT_AMBULATORY_CARE_PROVIDER_SITE_OTHER): Payer: Medicare Other | Admitting: Allergy

## 2022-01-30 ENCOUNTER — Encounter (INDEPENDENT_AMBULATORY_CARE_PROVIDER_SITE_OTHER): Payer: Self-pay | Admitting: Allergy

## 2022-01-30 VITALS — BP 123/76 | HR 74 | Temp 97.7°F | Ht 62.5 in | Wt 145.0 lb

## 2022-01-30 DIAGNOSIS — D809 Immunodeficiency with predominantly antibody defects, unspecified: Secondary | ICD-10-CM

## 2022-01-30 DIAGNOSIS — J479 Bronchiectasis, uncomplicated: Secondary | ICD-10-CM

## 2022-01-30 NOTE — Progress Notes (Signed)
Allergy/Immunology Follow-up    Date: 01/30/2022  ______________________________________________________________________    HPI: Tiffany Chen is a 77 year old female here for follow-up for immunodeficiency. Patient was last seen on 08/01/2021 at which time recommendations were for continuation of IgG replacement therapy ( Gamunex-C at 25g every 4 weeks ) with IgG check. Last IgG 936 in November 2023. AST slightly elevated. Pending hernia repair/GI surgery. She has noted a significant positive impact on infusions. Tolerating infusions well.     Follow-up 08/01/21: Patient was last seen on 03/19/2021 at which time recommendations were for continuation of IgG replacement therapy Gamunex-C at 25g every 4 weeks. IgG 1030 1 month ago. CBC without cytopenia.    Follow-up 03/19/21: Patient was last seen on 12/18/2020 at which time recommendations were to proceed with IVIG at [facility] starting at 516m/kg. Patient has tolerated IVIG well, and is now s/p 3 infusions. She will be undergoing lung lobectomy on 3/14.     Follow-up 12/18/20: Patient was last seen on 09/07/2020 at which time recommendations were for additional immune work-up including repeat quantitative immunoglobulins as well as vaccine titers (tetanus and pneumococcal). Pneumovax-23 administered 09/22/20. Only 14/23 protective at recheck. Discussion on IVIG replacement therapy today, and patient [facility]     Initial consultation: Prior medical records have been reviewed carefully prior to this consultation and salient details have been summarized below. Patient has a history of bronchiectasis, cavitary lung infection (MAC). There is no history of chronic sinusitis. She has had multiple rounds of treatment for MAC, with most recent antibiotic regimen including amikacin, azithromycin, ethambutol and rifampin.     Environmental and social history:  Tobacco smoke exposure:   Social History      Tobacco Use   Smoking Status Never   Smokeless Tobacco Never         Past Medical and Surgical History:  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     Past Surgical History:   Procedure Laterality Date    BRONCHOSCOPY  12/2020    CESAREAN SECTION, LOW TRANSVERSE      COLOSTOMY      CRANIOTOMY      LUNG LOBECTOMY         Allergies:  Allergies   Allergen Reactions    Walnuts Swelling     Tongue swollen and burn       Medications:    Current Outpatient Medications:     ARIKAYCE 590 MG/8.4ML SUSP, Inhale the contents of one vial (5926m via Lamira device 1 time daily  as directed., Disp: 28 each, Rfl: 5    atorvastatin (LIPITOR) 10 MG tablet, Take 1 tablet (10 mg) by mouth daily., Disp: 90 tablet, Rfl: 2    azithromycin (ZITHROMAX) 250 MG tablet, TAKE 1 TABLET BY MOUTH  DAILY, Disp: 60 tablet, Rfl: 11    CLOFAZIMINE IRB 21-0063 50 MG CAPSULE, Take 2 capsules (162m) by mouth once daily with food as directed., Disp: 200 capsule, Rfl: 0    CLOFAZIMINE IRB 21-0063 50 MG CAPSULE, Take 2  capsules (1040m by mouth once daily with food as directed., Disp: 200 capsule, Rfl: 0    ethambutol (MYAMBUTOL) 400 MG tablet, TAKE 2 TABLETS BY MOUTH  DAILY, Disp: 120 tablet, Rfl: 11    ferrous sulfate 325 (65 Fe) MG tablet, Take 1 tablet (325 mg) by mouth 3 times daily., Disp: , Rfl:     fluoxetine (PROZAC) 40 MG capsule, Take 1 capsule (40 mg) by mouth daily., Disp: 90 capsule, Rfl: 3    ketOROLAC (ACULAR) 0.5 % ophthalmic solution, 1 drop to surgical eye bid  Start 2 days before surgery, Disp: 5 mL, Rfl: 3    metFORMIN (GLUCOPHAGE) 500 mg tablet, Take 1 tablet (500 mg) by mouth 2 times daily (with meals)., Disp: , Rfl:     moxifloxacin (VIGAMOX) 0.5 % ophthalmic solution, 1 drop to the surgical eye qid. Start 2 days before surgery, Disp: 5 mL, Rfl: 3    neomycin 500 MG tablet, Take 2 tablets (1000 mg) by mouth at noon and 2 tablets (1000 mg) at 8 PM on the day prior to surgery., Disp: 4 tablet, Rfl: 0    omeprazole (PRILOSEC) 20 MG capsule, TAKE 1 CAPSULE BY MOUTH IN  THE MORNING BEFORE  BREAKFAST, Disp: 90 capsule, Rfl: 3    Pediatric Multivitamins-Fl (MULTIVITAMINS/FL PO), , Disp: , Rfl:     prednisoLONE acetate (PRED FORTE) 1 % ophthalmic suspension, 1 drop to surgical eye qid. Start after surgery. MD to taper, Disp: 5 mL, Rfl: 3    ramipril (ALTACE) 2.5 MG capsule, Take 1 capsule (2.5 mg) by mouth daily., Disp: 90 capsule, Rfl: 1    rifAMPin (RIFADIN) 300 MG capsule, TAKE 2 CAPSULES BY MOUTH  DAILY, Disp: 120 capsule, Rfl: 11    sodium chloride 7 % NEBU, USE 1 VIAL VIA NEBULIZER  EVERY 12 HOURS, Disp: 720 mL, Rfl: 3    umeclidinium-vilanterol (ANORO ELLIPTA) 62.5-25 MCG/ACT inhaler, Inhale 1 puff by mouth daily., Disp: 60 each, Rfl: 11    VENTOLIN HFA 108 (90 Base) MCG/ACT inhaler, USE 2 INHALATIONS BY MOUTH  EVERY 6 HOURS AS NEEDED FOR WHEEZING, Disp: 72 g, Rfl: 3    Current Facility-Administered Medications:     lidocaine (XYLOCAINE) 2 % viscous solution 1 mL, 1 mL, Other, PRN, Broderick, RyEthelle Lyon MD    lidocaine (XYLOCAINE) 4 % topical, , Other, PRN, Broderick, RyEthelle LyonMD    FH: No FH of atopic disease: asthma, allergic rhinitis, eczema, food allergies    Diagnostic studies:          IMPRESSION:  1. Post interval right upper lobectomy with a small residual right hydropneumothorax.      2. Scattered tree-in-bud airspace opacities throughout the lungs, similar to slightly improved.  Previously seen patchy groundglass opacities within the lungs appear improved.    3. Mediastinal lymphadenopathy.  This appears slightly increased in comparison to the prior study.          Prior diagnostic studies:   CMP normal liver,  total protein  CBC : no lymphopenia or thrombocytopenia                                    Impression:  77 year old here for evaluation of:    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 Bronchiectasis   3. 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 Bronchiectasis   3. MAC infection  Patient presents for follow-up for immunodeficiency with antibody defects. Given her history of persistent MAC infection, bronchiectasis, low IgM, low IgG subclass, and poor vaccine titers, advise continuation of IgG replacement therapy. IgG replacement infusion therapy is well tolerated, is medically indicated given the above clinical profile and concerns for evolution to CVID given low IgM and low IgG subclass 2/3. IgG has been stable in the 320-122-9162 range with last IgG trough at 936 in November 2023. Continues bronchiectasis / MAC management with [name].      We have discussed both intravenous and subcutaneous immunoglobulin replacement therapy with the patient and she would like to continue with IVIG at [facility] - Tuesdays/Wednesdays best days. Risks of immunoglobulin replacement therapy were carefully reviewed with the patient as there are several black box warnings on the products. Although the risks  general occur more in high dose immunoglobulin replacement regimens such as in neurological disorders, we have seen severe adverse effects at PID dosing and advise all patients that these risks are real, severe and capable of presenting at any dosing scheme. We have reviewed anaphylaxis, shock, death, pulmonary embolism, DVT, hemolytic disorders, acute renal failure (we have seen acute renal dysfunction but not failure in non-sucrose formulations), aseptic meningitis, TRALI/pulmonary edema, swelling, weight gain and local infusion site reactions/pain.   -Will continue at current dosing Gamunex-C at 25g every 4 weeks  -Recommendations are for immunoglobulin replacement therapy  -Risks, alternatives, benefits discussed as above  -Risk of severe progression of lung disease and life-threatening infections if immunoglobulin replacement is not approved and has been clearly documented above in terms of reasons to support prescription; failure to approve could lead to life-threatening pulmonary consequences   -IgG check at next 2 infusions    RTC 6 months     The plan of care was discussed with the patient who expressed agreement and understanding. Questions were answered prior to conclusion of the visit.     Durel Salts, MD  Clinical Associate Professor, [facility]

## 2022-01-30 NOTE — Patient Instructions (Signed)
After visit summary:    Continue IVIG at [facility] monthly     Follow-up in 6 months

## 2022-02-04 ENCOUNTER — Encounter (INDEPENDENT_AMBULATORY_CARE_PROVIDER_SITE_OTHER): Payer: Self-pay | Admitting: Allergy

## 2022-02-05 ENCOUNTER — Other Ambulatory Visit: Payer: Medicare Other | Attending: Critical Care Medicine

## 2022-02-05 DIAGNOSIS — A319 Mycobacterial infection, unspecified: Secondary | ICD-10-CM | POA: Insufficient documentation

## 2022-02-05 LAB — COMPREHENSIVE METABOLIC PANEL, BLOOD
ALT (SGPT): 20 U/L (ref 0–33)
AST (SGOT): 23 U/L (ref 0–32)
Albumin: 4.1 g/dL (ref 3.5–5.2)
Alkaline Phos: 56 U/L (ref 40–130)
Anion Gap: 11 mmol/L (ref 7–15)
BUN: 21 mg/dL (ref 8–23)
Bicarbonate: 23 mmol/L (ref 22–29)
Bilirubin, Tot: 0.52 mg/dL (ref <1.2)
Calcium: 9.5 mg/dL (ref 8.5–10.6)
Chloride: 104 mmol/L (ref 98–107)
Creatinine: 0.76 mg/dL (ref 0.51–0.95)
Glucose: 138 mg/dL — ABNORMAL HIGH (ref 70–99)
Potassium: 4.5 mmol/L (ref 3.5–5.1)
Sodium: 138 mmol/L (ref 136–145)
Total Protein: 7 g/dL (ref 6.0–8.0)
eGFR Based on CKD-EPI 2021 Equation: >60 mL/min/{1.73_m2}

## 2022-02-05 LAB — CBC WITH DIFF, BLOOD
ANC-Automated: 3.1 10*3/uL (ref 1.6–7.0)
Abs Basophils: 0.1 10*3/uL (ref <0.2)
Abs Eosinophils: 0.2 10*3/uL (ref 0.0–0.5)
Abs Lymphs: 1.1 10*3/uL (ref 0.8–3.1)
Abs Monos: 0.6 10*3/uL (ref 0.2–0.8)
Basophils: 1 %
Eosinophils: 3 %
Hct: 36.6 % (ref 34.0–45.0)
Hgb: 12.5 gm/dL (ref 11.2–15.7)
Lymphocytes: 22 %
MCH: 31.5 pg (ref 26.0–32.0)
MCHC: 34.2 g/dL (ref 32.0–36.0)
MCV: 92.2 um3 (ref 79.0–95.0)
MPV: 10.6 fL (ref 9.4–12.4)
Monocytes: 13 %
Plt Count: 233 10*3/uL (ref 140–370)
RBC: 3.97 10*6/uL (ref 3.90–5.20)
RDW: 13.7 % (ref 12.0–14.0)
Segs: 61 %
WBC: 5.1 10*3/uL (ref 4.0–10.0)

## 2022-02-09 ENCOUNTER — Telehealth (INDEPENDENT_AMBULATORY_CARE_PROVIDER_SITE_OTHER): Payer: Self-pay | Admitting: Internal Medicine

## 2022-02-09 DIAGNOSIS — F32A Depression, unspecified: Secondary | ICD-10-CM

## 2022-02-11 ENCOUNTER — Ambulatory Visit
Admission: RE | Admit: 2022-02-11 | Discharge: 2022-02-11 | Disposition: A | Discharge status: Home / Self Care | Payer: Medicare Other | Attending: Allergy | Admitting: Allergy

## 2022-02-11 VITALS — BP 123/65 | HR 64 | Temp 98.0°F | Resp 16 | Ht 62.5 in | Wt 150.6 lb

## 2022-02-11 DIAGNOSIS — D809 Immunodeficiency with predominantly antibody defects, unspecified: Secondary | ICD-10-CM | POA: Insufficient documentation

## 2022-02-11 MED ORDER — CETIRIZINE HCL 10 MG OR TABS
10.0000 mg | ORAL_TABLET | Freq: Once | ORAL | Status: AC
Start: 2022-02-11 — End: 2022-02-11
  Administered 2022-02-11: 10 mg via ORAL
  Filled 2022-02-11: qty 1

## 2022-02-11 MED ORDER — DEXTROSE 5 % IV SOLN
INTRAVENOUS | Status: DC
Start: 2022-02-11 — End: 2022-02-12

## 2022-02-11 MED ORDER — DIPHENHYDRAMINE HCL 25 MG OR TABS OR CAPS CUSTOM
25.0000 mg | ORAL_CAPSULE | Freq: Once | ORAL | Status: DC | PRN
Start: 2022-02-11 — End: 2022-02-15

## 2022-02-11 MED ORDER — ACETAMINOPHEN 325 MG PO TABS
650.0000 mg | ORAL_TABLET | Freq: Once | ORAL | Status: AC
Start: 2022-02-11 — End: 2022-02-11
  Administered 2022-02-11: 650 mg via ORAL
  Filled 2022-02-11: qty 2

## 2022-02-11 MED ORDER — IMMUNE GLOBULIN (HUMAN) 20 GM/200ML IJ SOLN
0.5000 g/kg | Freq: Once | INTRAMUSCULAR | Status: AC
Start: 2022-02-11 — End: 2022-02-11
  Administered 2022-02-11: 25 g via INTRAVENOUS
  Filled 2022-02-11: qty 200

## 2022-02-11 NOTE — Telephone Encounter (Unsigned)
*MEDS FLAGGED FOR REMOVAL*      *The requested med passed all protocol parameters in the associated med info guide - Protocol details reviewed by pharmacist.       Serotonin Reuptake Inhibitor Refill Protocol (or Trazodone or Buspar)    Last visit in enc specialty: 12/18/2021     Recent Visits in This Encounter Department       Date Provider Department Visit Type Primary Dx    12/18/2021 Shellia Cleverly, MD [facility] Visit Annual physical exam    04/23/2021 Shellia Cleverly, MD [place] [place] [facility] Visit Type 2 diabetes mellitus without complication, without long-term current use of insulin (CMS-HCC)           Population Health Visits  Recent [facility] Visits    None       Next appt in enc specialty: Visit date not found      Future Appointments 02/11/2022 - 02/10/2027        Date Visit Type Department Provider     02/28/2022  1:00 PM AUDIOGRAM [facility] AUDIOLOGY Barbaraann Share    Appointment Notes:     Ototoxic Monitoring             03/06/2022  3:30 PM RETURN TB/MAC [place] [facility] Arleta Creek, MD             03/11/2022  9:00 AM LVL 3 - 3 HR [place], 4TH FLOOR    Appointment Notes:     IVIG 25G Q 4weeks Pt wants 4th Fl only             04/08/2022 10:30 AM POST OP MIN INV SURG [place] [facility] Christianne Borrow, NP             04/15/2022  1:00 PM [facility] - Neurology - [facility] Damaris Schooner, MD    Appointment Notes:     Tremor             04/17/2022 10:00 AM RETURN TB/MAC [place] Katheren Shams, MD    Appointment Notes:     4 months follow up             05/20/2022 12:30 PM [place] Gilda Crease, MD    Appointment Notes:     4 month f/u w MD             05/30/2022  1:00 PM  [place], Genevieve Newman    Appointment Notes:     Ototoxic Monitoring             07/31/2022 10:15 AM RETURN IMMUNODEFICIENCY [place] [facility] Maudie Mercury Bedelia Person, MD    Appointment Notes:     Immuno 6 mon f/u             08/30/2022  2:00 PM [place] Otila Kluver    Appointment Notes:     Ototoxic Monitoring  Audio             11/15/2022  1:00 PM [place]    Appointment Notes:     1 year Audio  Per OV note on 04/23/21:  She has worsening depression and anxiety after losing husband 12/2018 from [place]; on Prozac 30 mg daily but has reduced appetite and increased sleeping, difficult to concentrate but denies SI.     A/P:      Depression, unspecified depression type  Endorses depressed mood surrounding current health status, no SI. Endorses protective factors and strong family support. PHQ score of 19 today. Will refer to [facility] and psychiatry. Increased Fluoxetine from 30 mg to 40 mg daily.  -     Consult/Referral to [employer].   -     Consult/Referral to Psychiatry  -     fluoxetine (PROZAC) 40 MG capsule; Take 1 capsule (40 mg) by mouth daily.  -     F/u in 1-2 months      LABS required:  (Q yr SCr (paxil only; lexapro removed creat req on 10/04/19))    Lab Results   Component Value Date    NA 138 02/05/2022    K 4.5 02/05/2022    CL 104 02/05/2022    BICARB 23 02/05/2022    BUN 21 02/05/2022    CREAT 0.76 02/05/2022       Lab Results   Component Value Date    GFRNON >60 11/06/2020    EGFRCKDEPI >60 02/05/2022         Monitoring required:  (Q year BP, HR)   *EKG/QTc at appropriate interval for irregular HR in pts w CVD and/or risk factors assoc w  QTc prolongation (High Dose/DDi/Age etc)*    (BP range: DBZMCEYE=23-361  QAESLPNPY=05-11)  Blood Pressure   02/11/22 123/65   01/30/22 123/76   01/15/22 138/73       (HR range: 55-110)  Pulse Readings from Last 3  Encounters:   02/11/22 64   01/30/22 74   01/15/22 68         Last Digital Health Monitoring Vitals:        Last MyChart BP Values:        Last Pt Entered MyChart BP Values:

## 2022-02-11 NOTE — Interdisciplinary (Addendum)
Non-Chemotherapy Infusion Nursing Note - [place] is a 77 year old female who presents for infusion of IVIG 25 g    Vitals:    02/11/22 0849 02/11/22 1137   BP: 134/72 123/65   BP Location: Left arm Right arm   BP Patient Position: Sitting Sitting   Pulse: 81 64   Resp: 16 16   Temp: 98 F (36.7 C) 98 F (36.7 C)   TempSrc: Temporal    SpO2: 99% 99%   Weight: 68.3 kg (150 lb 9.2 oz)    Height: 5' 2.5" (1.588 m)      Pain Score: NA (pre med, non-pain or scheduled)  Body surface area is 1.74 meters squared.  Body mass index is 27.1 kg/m.    Medications   dextrose 5% infusion (0 mL IntraVENOUS Stopped 02/11/22 1135)   diphenhydrAMINE (BENADRYL) tablet or capsule 25 mg (has no administration in time range)   acetaminophen (TYLENOL) tablet 650 mg (650 mg Oral Given 02/11/22 0905)   cetirizine (ZYRTEC) tablet 10 mg (10 mg Oral Given 02/11/22 0905)   immune globulin (human) (IgG) 25 g in 250 mL (GAMUNEX-C) infusion (0 g IntraVENOUS Completed 02/11/22 1133)       Pre-treatment nursing assessment:  No problems identified upon assessment.    [name] tolerated treatment well.    Post blood return: Brisk  Post-Flush: NS and Discontinued IV    Patient Education  Learner: Patient  Barriers to learning: No Barriers  Readiness to learn: Acceptance  Method: Explanation    Treatment Education: Information/teaching given to patient including: signs and symptoms of infection, bleeding, adverse reaction(s), symptom control, and when to notify MD.    [name] Prevention Education: Instructed patient to call for assistance.    Pain Education: Patient instructed to contact nurse if pain should develop or if their current pain therapy becomes ineffective.    Response: Verbalizes understanding    Discharge Plan  Discharge instructions given to patient.  pt having surgery requested this date 4/22  Future appointments given and reviewed with treatment plan.  Discharge Mode: Ambulatory  Discharge Time:  1135  Accompanied by: Self  Discharged To: Home

## 2022-02-12 MED ORDER — FLUOXETINE HCL 40 MG OR CAPS
40.00 mg | ORAL_CAPSULE | Freq: Every day | ORAL | 3 refills | Status: DC
Start: 2022-02-12 — End: 2022-09-30

## 2022-02-12 NOTE — Telephone Encounter (Signed)
Scheduling Request:   [place]     Please remind pt to schedule f/u appt w Dr Tama Headings, Suzanna Obey, for Return in about 3 months (around 03/19/2022).    *Please inform pt to wait to complete any ordered labs until AFTER their scheduled visit, unless otherwise instructed by their provider*

## 2022-02-12 NOTE — Telephone Encounter (Signed)
Per Medication Reconciliation Committee protocol:  2 meds previously flagged for removal were d/c'd by the pharmacist

## 2022-02-12 NOTE — Telephone Encounter (Signed)
MyChart msg sent to pt.

## 2022-02-18 ENCOUNTER — Encounter: Payer: Self-pay | Admitting: Hospital

## 2022-02-21 ENCOUNTER — Other Ambulatory Visit: Payer: Self-pay

## 2022-02-21 NOTE — Addendum Note (Signed)
Addended by: Ree Shay on: 02/21/2022 02:56 PM     Modules accepted: Level of Service

## 2022-02-22 ENCOUNTER — Other Ambulatory Visit: Payer: Self-pay

## 2022-02-22 ENCOUNTER — Telehealth (HOSPITAL_BASED_OUTPATIENT_CLINIC_OR_DEPARTMENT_OTHER): Payer: Self-pay | Admitting: Critical Care Medicine

## 2022-02-22 ENCOUNTER — Encounter (INDEPENDENT_AMBULATORY_CARE_PROVIDER_SITE_OTHER): Payer: Self-pay

## 2022-02-22 NOTE — Telephone Encounter (Addendum)
[place] Prior Authorization Status       PA for Arikayce 568m/8.4ml Susp, one vial daily as directed, #[phone]    Approved by [employer] Part D    ID: [id number]  CMM Key: N/A, Verbal PA     Dates: 01/07/2022 to 01/07/2023    Case ID#: [id number]  Specialty pharmacy: Prescription currently @ [facility]    [place]: TBD: last disp on 2/12; refill too soon til 03/12/2022    Note: Approval letter currently unavailable, will upload and attach to encounter if/once received   --Verbal PA sub'd with [employer] PA rep, CoLoma Sousa.    PLAN:  --Prescription needs to be entered, if pt agreeable, routed to [employer]--[facility]

## 2022-02-26 ENCOUNTER — Telehealth (INDEPENDENT_AMBULATORY_CARE_PROVIDER_SITE_OTHER): Payer: Self-pay | Admitting: General Surgery

## 2022-02-26 NOTE — Telephone Encounter (Signed)
Procedure: Open Ventral hernia repair  DOS: 03/25/22  Surgical Risk Classification System Category:High  RN APC Triage Visit Level: 3  Does patient require in person APP appt? No    This patient's chart has been reviewed. The APC scheduling team will schedule the appointment. Once appointment is made they will message you back. If no appt slot can be found, patient will be seen by anesthesia DOS.      Thank you for your patience,  APC Team Members

## 2022-02-26 NOTE — Telephone Encounter (Signed)
Tiffany Chen 06-10-1945 [id number]    APC Triage Tool Visit Result: APP Video Visit   Translation Needed: NO  DOS: 03/25/22  Surgeon: Dr. Hillery Hunter  Status: Inpatient       Patient needs APC appointment 2-5 weeks from DOS. No appointments available.

## 2022-02-27 NOTE — Progress Notes (Signed)
AUDIOMETRIC ASSESSMENT    History:  Tiffany Chen, a 77 year old female, was referred by Dr. Adela Lank for continued audiometric evaluation due to potential ototoxic medication.  Patient noted no subjective hearing change since her last Audiogram at [facility] 11/15/2021.        Otoscopy:  Otoscopy revealed clear external auditory canals and good tympanic membrane visualization bilaterally.     Subjective Results:  Pulsed tone testing utilizing insert ear phones indicated bilateral thresholds WNLs [phone] Hz, with sloping mild to moderate sensorineural loss thresholds at 2000 Hz and above. Speech audiometry revealed excellent word recognition of phonetically balanced English words (100%) bilaterally. Speech reception thresholds (SRTs) were consistent with pure tone averages (PTAs), indicating good reliability.     Impression:  Comparison with test results obtained at this clinic 09/04/2021 indicated stable air conduction thresholds bilaterally across the frequency range (i.e. no air conduction threshold change greater than 10 dB) with the exception of an improved threshold right  ear (15 dB) at 3000 Hz. Speech audiometry revealed stable word recognition of phonetically balanced English word ability bilaterally.     Test results were discussed with the patient, and patient questions were answered.     Plan:     * Patient has audiograms scheduled every three months during her course of ototoxic medication.   * She will contact referring physician and Audiology department ASAP should she notice markedly diminished acuity or onset of tinnitus.   * Patient was given a Xerox copy test results.               Audiogram viewable in EPIC "Media" tab.    Neal Dy  Senior Audiologist  [facility]  Otolaryngology / Head & Neck Surgery and Audiology

## 2022-02-28 ENCOUNTER — Ambulatory Visit (INDEPENDENT_AMBULATORY_CARE_PROVIDER_SITE_OTHER): Payer: Medicare Other | Admitting: Audiology

## 2022-02-28 DIAGNOSIS — H903 Sensorineural hearing loss, bilateral: Secondary | ICD-10-CM

## 2022-03-04 NOTE — Telephone Encounter (Signed)
Attempted to contact patient to schedule APC appointment. LM for patient with proposed appointment details. Redirected back to procedure scheduling team. Routed to procedure scheduling to contact patient and confirm APC appointment in EPIC. Please send new message if patient proposed appointment needs to be rescheduled.    Thank you,      APC Team

## 2022-03-05 NOTE — Progress Notes (Signed)
Bronchiectasis and Nontuberculous Mycobacteria Pulmonary Clinic     CC: Follow up     HPI:   Tiffany Chen is a 77 year old female with history of bronchiectasis, cavitary MAC lung infection, ruptured diverticulitis s/p colostomy 2009, ICH s/p embolization who presents for follow up.     See full history details in Dr. Lysbeth Penner note, last visit in 12/19/21.    Patient reports she is feeling well overall. Tremor persists but is not worsening and no associated numbness/tingling. She is otherwise tolerating the medications well. Not always doing airway clearance twice daily but she is walking 3-5 miles multiple times per week. She is planned for ventral hernia repair / ostomy take down in a few weeks. Her cough is minimal and overall stable. She has gained ~20 lbs since starting therapy, and has great appetite. She had lost ~30lb related to the MAC infection. Energy level is good.     Treatment history:  MAC (all azithro/rif/ethambutol three times weekly)  2018-01/2018  05/2018-10/2019  12/2019-02/2020  08/2020: restarted azithro/rifampin/ethambutol + IV amikacin 08/2020, transitioned from IV amikacin to Arikayce 11/2020, clofazimine added 02/2021    Airway clearance regimen:  Hypertonic saline - not always doing    PMHx/PSHx:  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)     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     Past Surgical History:   Procedure Laterality Date    BRONCHOSCOPY  12/2020    CESAREAN SECTION, LOW TRANSVERSE      COLOSTOMY      CRANIOTOMY      LUNG LOBECTOMY          Allergies   Allergen Reactions    Walnuts Swelling     Tongue swollen and burn     Current Outpatient Medications   Medication Sig    ARIKAYCE 590 MG/8.4ML SUSP Inhale the contents of one vial ('590mg'$ ) via [place]ira device 1 time daily as directed.    atorvastatin (LIPITOR) 10 MG tablet Take 1 tablet (10 mg) by mouth daily.    azithromycin (ZITHROMAX) 250 MG tablet TAKE 1 TABLET BY MOUTH  DAILY    CLOFAZIMINE IRB 21-0063 50 MG CAPSULE Take 2 capsules ('100mg'$ ) by mouth once daily with food as directed.    CLOFAZIMINE IRB 21-0063 50 MG CAPSULE Take 2 capsules ('100mg'$ ) by mouth once daily with food as directed.    ethambutol (MYAMBUTOL) 400 MG tablet TAKE 2 TABLETS BY MOUTH  DAILY    ferrous sulfate 325 (65 Fe) MG tablet Take 1 tablet (325 mg) by mouth 3 times daily.    fluoxetine (PROZAC) 40 MG capsule Take 1 capsule (40 mg) by  mouth daily.    metFORMIN (GLUCOPHAGE) 500 mg tablet Take 1 tablet (500 mg) by mouth 2 times daily (with meals).    moxifloxacin (VIGAMOX) 0.5 % ophthalmic solution 1 drop to the surgical eye qid. Start 2 days before surgery    neomycin 500 MG tablet Take 2 tablets (1000 mg) by mouth at noon and 2 tablets (1000 mg) at 8 PM on the day prior to surgery.    omeprazole (PRILOSEC) 20 MG capsule TAKE 1 CAPSULE BY MOUTH IN  THE MORNING BEFORE  BREAKFAST    Pediatric Multivitamins-Fl (MULTIVITAMINS/FL PO)     ramipril (ALTACE) 2.5 MG capsule Take 1 capsule (2.5 mg) by mouth daily.    rifAMPin (RIFADIN) 300 MG capsule TAKE 2 CAPSULES BY MOUTH   DAILY    sodium chloride 7 % NEBU USE 1 VIAL VIA NEBULIZER  EVERY 12 HOURS    umeclidinium-vilanterol (ANORO ELLIPTA) 62.5-25 MCG/ACT inhaler Inhale 1 puff by mouth daily.    VENTOLIN HFA 108 (90 Base) MCG/ACT inhaler USE 2 INHALATIONS BY MOUTH  EVERY 6 HOURS AS NEEDED FOR WHEEZING     Current Facility-Administered Medications   Medication    lidocaine (XYLOCAINE) 2 % viscous solution 1 mL    lidocaine (XYLOCAINE) 4 % topical       Social Hx:  Never smoker.   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   Her husband died after contracting COVID in the hospital.   Moved in with her brother in [place] several months ago.   Her brother is Vegan.   No hot tubs, no swimming pools.     Family Hx:  No family history of bronchiectasis.    Family History   Problem Relation Name Age of Onset    Cancer Mother Prescilla Sours ngs and throat    Heart Disease Mother Eliane Decree         Congrestive heart failure due to cancer    Hypertension Mother Eliane Decree     Psychiatry Mother Eliane Decree         Bi polar, manic depressive    Cancer Father Warren Danes         Prostate    Diabetes Father Warren Danes     Stroke Father Warren Danes     Stroke M Grandmother [place]     Cancer Brother Dian Situ         Skin (many locations)    Diabetes Brother Dian Situ        OBJECTIVE:  BP 119/71 (BP Location: Left arm, BP Patient Position: Sitting, BP cuff size: Regular)   Pulse 77   Temp 98.2 F (36.8 C) (Temporal)   Resp 17   Ht 5' 2.5" (1.588 m)   Wt 68 kg (150 lb)   SpO2 95%   BMI 27.00 kg/m   Physical exam:  Gen: NAD,   CV: RRR, no m/r/g  Pulm: expiratory wheeze throughout R hemilung   Abd: ventral hernia protruding LLQ   Ext: WWP, no edema   Skin: Area of raised, slightly erythematous plaque on R forearm. Picture in chart.   Neuro: mild intention tremor. FTN testing and rapid finger movements in tact.     DATA     Labs:  RAST IgE 13 no significant fungal sensitization  RF / ANA / SSA / SSB wnl    Microbiology:  BAL 04/28/2020  (from Dr. Cecile Hearing): AFB smear 3+, culture  MAC      Sputum - AFB  08/01/20: AFB smear negative, Cx MAC  08/03/20: AFB smear 1+, Cx MAC (MIS clarithro 0.5, amikacin 4)  08/07/20: AFB smear negative, Cx MAC  10/25: AFB smear 1+, Cx MAC  11/1, 11/09/2020: AFB smear negative, Cx MAC  12/25/2020: AFB smear 2+, Culture MAC  01/30/2021: AFB smear and culture negative  02/06/2021: AFB smear negative, culture heavy MAC  02/12/2021: AFB smear and culture negative  02/15/2021: AFB smear negative, culture moderate MAC  08/28/2021: AFB smear negative, culture negative  08/31/2021: AFB smear negative, culture negative  01/14/22: AFB smear negative, culture pending  01/23/22: AFB smear negative, culture pending     Sputum - Routine   08/01/20: NL Resp Flora   08/03/20: NL Resp Flora  08/07/20: NL Resp Flora  2023 no positive cultures  01/23/22: NL flora    Pulmonary Function Testing:   Date FEV1 (%) FVC (%) FEV1/FVC TLC (%) DLCO (%)    04/12/20 1.92 (95) 2.49 (90( 81 4.43 (96) 13.58 (69)     02/16/21 1.6 (85) 2.00 (79) 80 4.33 (90) 11.03 (58)    08/17/21 1.49 (79) 1.88 (74) 79 4.16 (87) 10.07 (53)      Radiology:  CT chest 06/22/21  1. Post interval right upper lobectomy with a small residual right hydropneumothorax.      2. Scattered tree-in-bud airspace opacities throughout the lungs, similar to slightly improved.  Previously seen patchy groundglass opacities within the lungs appear improved.    3. Mediastinal lymphadenopathy.  This appears slightly increased in comparison to the prior study.     Treatment Monitoring:  02/05/22: CMP wnl, CBC wnl    Audiograms:  02/2022: Comparison with test results obtained at this clinic 09/04/2021 indicated stable air conduction thresholds bilaterally across the frequency range (i.e. no air conduction threshold change greater than 10 dB) with the exception of an improved threshold right  ear (15 dB) at 3000 Hz. Speech audiometry revealed stable word recognition of phonetically balanced English word ability  bilaterally.    EKG:  10/2021: QTC 434     IMPRESSION & PLAN     Tiffany Chen is a 77 year old female with history of bronchiectasis, cavitary MAC lung infection who presents for follow up.     Problem List:  #Bronchiectasis  #Cavitary MAC lung infection: on azithro/rifampin/ethambutol + IV amikacin 08/2020, transitioned from IV amikacin to Arikayce 11/2020, clofazimine added 02/2021. AFB cultures last positive in 02/2021   #S/p robotic right upper lobectomy, middle/lower lobe wedge resection, 03/2021  #Reactive airway disease with recent exacerbation 12/2021, tx with prednisone , ICS/LABA with improvement  #Hypogammaglobulinemia, on IVIG replacement since 01/2021  #Tremor  #GERD, on EGD with [facility] 01/2021   #Isolated low DLCO on PFTs: this pattern has been present since 2022, may be worsened now s/p lobectomy and multiple wedge resections. Low suspicion for vascular disease (PH) based on physical exam, consider echo in the future     Plan:  - continue anoro given symptomatic benefit   - still pending neuro evaluation re tremor - not worsening and no other signs of neuropathy at this time   - CT scan of the chest to be repeated sometime soon as treatment ends, however she prefers to wait until after her upcoming operation for ventral hernia repair / ostomy take down  - Continued follow up with Dr Maudie Mercury in Allergy re. Hypogammaglobulinemia   - Continue airway clearance with nebulized saline   - treatment  monitoring:   Monthly labs    Audiogram: done 02/2022, next due ~June    EKG: overdue, ordered, will likely get done during next week's preop visit   - Follow up already scheduled with Dr Debara Pickett in April     Total time spent 40 minutes:  - with patient, examining, history taking, planning management, counseling  - reviewing images  - records review  - interpretation of results  - documentation    Kimani Bedoya Collier Bullock, MD

## 2022-03-06 ENCOUNTER — Ambulatory Visit (INDEPENDENT_AMBULATORY_CARE_PROVIDER_SITE_OTHER): Payer: Medicare Other | Admitting: Internal Medicine

## 2022-03-06 ENCOUNTER — Encounter (INDEPENDENT_AMBULATORY_CARE_PROVIDER_SITE_OTHER): Payer: Self-pay | Admitting: Internal Medicine

## 2022-03-06 VITALS — BP 119/71 | HR 77 | Temp 98.2°F | Resp 17 | Ht 62.5 in | Wt 150.0 lb

## 2022-03-06 DIAGNOSIS — R9431 Abnormal electrocardiogram [ECG] [EKG]: Secondary | ICD-10-CM

## 2022-03-06 DIAGNOSIS — A319 Mycobacterial infection, unspecified: Secondary | ICD-10-CM

## 2022-03-06 NOTE — Patient Instructions (Addendum)
-   Continue to get labs checked monthly  - EKG ordered   - Continue twice weekly Ishihara color discrimination  - Next audiogram due in June or July 2024   - submit sputum cultures  - will schedule you for repeat CT scan at the next visit in April    -continue the nebulized saline twice a day  -follow up with DR [facility] as scheduled in April

## 2022-03-07 ENCOUNTER — Other Ambulatory Visit: Payer: Medicare Other | Attending: Critical Care Medicine

## 2022-03-07 ENCOUNTER — Encounter (INDEPENDENT_AMBULATORY_CARE_PROVIDER_SITE_OTHER): Payer: Self-pay | Admitting: Hospital

## 2022-03-07 DIAGNOSIS — A319 Mycobacterial infection, unspecified: Secondary | ICD-10-CM

## 2022-03-07 LAB — CBC WITH DIFF, BLOOD
ANC-Automated: 3.5 10*3/uL (ref 1.6–7.0)
Abs Basophils: 0 10*3/uL (ref <0.2)
Abs Eosinophils: 0.2 10*3/uL (ref 0.0–0.5)
Abs Lymphs: 1.3 10*3/uL (ref 0.8–3.1)
Abs Monos: 0.6 10*3/uL (ref 0.2–0.8)
Basophils: 1 %
Eosinophils: 4 %
Hct: 36.5 % (ref 34.0–45.0)
Hgb: 12.4 gm/dL (ref 11.2–15.7)
Lymphocytes: 23 %
MCH: 31.7 pg (ref 26.0–32.0)
MCHC: 34 g/dL (ref 32.0–36.0)
MCV: 93.4 um3 (ref 79.0–95.0)
MPV: 10.7 fL (ref 9.4–12.4)
Monocytes: 11 %
Plt Count: 212 10*3/uL (ref 140–370)
RBC: 3.91 10*6/uL (ref 3.90–5.20)
RDW: 13.4 % (ref 12.0–14.0)
Segs: 61 %
WBC: 5.7 10*3/uL (ref 4.0–10.0)

## 2022-03-07 LAB — COMPREHENSIVE METABOLIC PANEL, BLOOD
ALT (SGPT): 24 U/L (ref 0–33)
AST (SGOT): 25 U/L (ref 0–32)
Albumin: 4.1 g/dL (ref 3.5–5.2)
Alkaline Phos: 62 U/L (ref 40–130)
Anion Gap: 10 mmol/L (ref 7–15)
BUN: 19 mg/dL (ref 8–23)
Bicarbonate: 25 mmol/L (ref 22–29)
Bilirubin, Tot: 0.23 mg/dL (ref <1.2)
Calcium: 9.6 mg/dL (ref 8.5–10.6)
Chloride: 104 mmol/L (ref 98–107)
Creatinine: 0.87 mg/dL (ref 0.51–0.95)
Glucose: 195 mg/dL — ABNORMAL HIGH (ref 70–99)
Potassium: 4.1 mmol/L (ref 3.5–5.1)
Sodium: 139 mmol/L (ref 136–145)
Total Protein: 6.9 g/dL (ref 6.0–8.0)
eGFR Based on CKD-EPI 2021 Equation: >60 mL/min/{1.73_m2}

## 2022-03-11 ENCOUNTER — Ambulatory Visit
Admission: RE | Admit: 2022-03-11 | Discharge: 2022-03-11 | Disposition: A | Discharge status: Home / Self Care | Payer: Medicare Other | Attending: Allergy | Admitting: Allergy

## 2022-03-11 ENCOUNTER — Telehealth (INDEPENDENT_AMBULATORY_CARE_PROVIDER_SITE_OTHER): Payer: Self-pay

## 2022-03-11 VITALS — BP 119/58 | HR 60 | Temp 98.0°F | Resp 16 | Ht 62.5 in | Wt 154.5 lb

## 2022-03-11 DIAGNOSIS — Z0000 Encounter for general adult medical examination without abnormal findings: Secondary | ICD-10-CM

## 2022-03-11 DIAGNOSIS — E119 Type 2 diabetes mellitus without complications: Secondary | ICD-10-CM

## 2022-03-11 DIAGNOSIS — D809 Immunodeficiency with predominantly antibody defects, unspecified: Secondary | ICD-10-CM | POA: Insufficient documentation

## 2022-03-11 LAB — IGG, BLOOD: IGG: 949 mg/dL (ref 700–1600)

## 2022-03-11 LAB — AFB CULTURE W/STAIN: AFB Culture Result: NO GROWTH

## 2022-03-11 MED ORDER — ACETAMINOPHEN 325 MG PO TABS
650.0000 mg | ORAL_TABLET | Freq: Once | ORAL | Status: AC
Start: 2022-03-11 — End: 2022-03-11
  Administered 2022-03-11: 650 mg via ORAL
  Filled 2022-03-11: qty 2

## 2022-03-11 MED ORDER — CETIRIZINE HCL 10 MG OR TABS
10.0000 mg | ORAL_TABLET | Freq: Once | ORAL | Status: AC
Start: 2022-03-11 — End: 2022-03-11
  Administered 2022-03-11: 10 mg via ORAL
  Filled 2022-03-11: qty 1

## 2022-03-11 MED ORDER — DEXTROSE 5 % IV SOLN
INTRAVENOUS | Status: DC
Start: 2022-03-11 — End: 2022-03-11

## 2022-03-11 MED ORDER — DIPHENHYDRAMINE HCL 25 MG OR TABS OR CAPS CUSTOM
25.0000 mg | ORAL_CAPSULE | Freq: Once | ORAL | Status: DC | PRN
Start: 2022-03-11 — End: 2022-03-11

## 2022-03-11 MED ORDER — IMMUNE GLOBULIN (HUMAN) 20 GM/200ML IJ SOLN
0.5000 g/kg | Freq: Once | INTRAMUSCULAR | Status: AC
Start: 2022-03-11 — End: 2022-03-11
  Administered 2022-03-11: 25 g via INTRAVENOUS
  Filled 2022-03-11: qty 200

## 2022-03-11 NOTE — Interdisciplinary (Signed)
Non-Chemotherapy Infusion Nursing Note - Tiffany Chen is a 77 year old female who presents for infusion of immune globulin (human) (IgG) 25 g in 250 mL (GAMUNEX-C) infusion.    Vitals:    03/11/22 0835 03/11/22 1123   BP: 133/66 (!) 119/58   BP Location: Left arm Left arm   BP Patient Position: Sitting Sitting   Pulse: 66 60   Resp: 16 16   Temp: 98 F (36.7 C)    TempSrc: Temporal    SpO2: 96%    Weight: 70.1 kg (154 lb 8.7 oz)    Height: 5' 2.5" (1.588 m)      Pain Score: NA (pre med, non-pain or scheduled)  Body surface area is 1.76 meters squared.  Body mass index is 27.82 kg/m.    Pre-treatment nursing assessment:  No problems identified upon assessment.  Pt arrives alert and oriented.  Ambulatory with steady gait.  Pt denies any nausea, vomiting, and diarrhea or constipation.  Pt denies any recent fevers or illness.  Reports eating and drinking appropriately.  #22 PIV placed in left ac, 1 attempt, blood drawn and sent to lab.  Pt pre medicated with tylenol and zyrtec, as per mar.    Tiffany Chen tolerated treatment well.    Post blood return: Brisk  Post-Flush: NS  PIV dc'd intact with bleeding controlled.     Medications   dextrose 5% infusion (has no administration in time range)   diphenhydrAMINE (BENADRYL) tablet or capsule 25 mg (25 mg Oral Not given 03/11/22 0917)   acetaminophen (TYLENOL) tablet 650 mg (650 mg Oral Given 03/11/22 0849)   cetirizine (ZYRTEC) tablet 10 mg (10 mg Oral Given 03/11/22 0849)   immune globulin (human) (IgG) 25 g in 250 mL (GAMUNEX-C) infusion (0 g IntraVENOUS Completed 03/11/22 1122)       Patient Education  Learner: Patient  Barriers to learning: No Barriers  Readiness to learn: Acceptance  Method: Explanation    Treatment Education: Information/teaching given to patient including: signs and symptoms of infection, bleeding, adverse reaction(s), symptom control, and when to notify MD.    [name] Prevention Education: Instructed patient to call  for assistance.    Pain Education: Patient instructed to contact nurse if pain should develop or if their current pain therapy becomes ineffective.    Response: Verbalizes understanding    Discharge Plan  Discharge instructions given to patient.  Future appointments given and reviewed with treatment plan.  Discharge Mode: Ambulatory  Discharge Time: [place] by: Self  Discharged To: Home

## 2022-03-12 ENCOUNTER — Ambulatory Visit (INDEPENDENT_AMBULATORY_CARE_PROVIDER_SITE_OTHER): Payer: Medicare Other | Admitting: Nurse Practitioner

## 2022-03-12 DIAGNOSIS — Z01818 Encounter for other preprocedural examination: Secondary | ICD-10-CM

## 2022-03-12 NOTE — Patient Instructions (Addendum)
PREOPERATIVE SURGICAL INFORMATION     Your surgery is currently scheduled at [facility] on 3/18  The scheduler will be contacting you with the check in time                                                                         [facility], [place], [place], [place], [place] [address]    Please check in at [employer], [place], 1st floor.     PARKING    For [employer]:   Arbor Tour manager (at the end of [address]) or in Lot [phone] (at [employer] and [place]: [place] parking is available between 8 a.m. and 4:30 p.m. weekdays at the main entrance to the [employer] on [address]. The cost is the same as self parking. With a handicapped placard, [place] parking is free.   [email or web address]       QUESTIONS    If you have any questions between now and the day of your surgery, please do not hesitate to call:     [facility]: [phone]     DAY OF SURGERY ARRIVAL TIME:    On the day of your Surgery/Procedure, please arrive at the time provided by the surgeon's clinic. If you have any questions regarding your arrival time, please call the surgeon's clinic:      Preoperative Surgical Admissions at [facility]: [place]:       MEDICATION INSTRUCTIONS BEFORE SURGERY/PROCEDURE:      Medications to hold prior to surgery:    On the day before surgery/procedure as you are undergoing a special bowel preparation    do not take Metformin-generic (Glucophage) this day  If you have a low blood sugar on the day of your Bowel Preparation, take oral glucose (4 glucose tabs, or half a cup of clear juice or clear soda).  On the day of surgery/procedure    do not take Metformin-generic (Glucophage) this morning  You may be given separate instructions that you should not eat or drink anything after midnight, the night before your  procedure/surgery. If you have a blood sugar below 70 after the time you are no longer supposed to take anything by mouth, you may safely treat the low sugar with either glucagon (injection or intranasal), Glucogel, or one tablespoon of granulated sugar placed under the tongue or in the cheek area. Up until 4 hours before your surgery/procedure, you may treat your low blood sugar with 4 ounces (1/2 cup) of clear juice (no pulp), or 4 ounces (1/2 cup) of sugar containing clear soda (not a diet drink)        These are suggestions. You must take into account your current clinical condition. If you have any questions, please contact your primary care physician, endocrinologist or surgeon. If you have a medical emergency, please go to the nearest emergency room or call 911 for assistance.  No---ramipril ----------the evening before or the morning of surgery     OK to take the following medications as scheduled with a small sip of water on the morning of surgery:  Continue all other medications as directed      PLEASE HOLD ALL NSAIDS (non-steroidal anti-inflammatory drugs) SUCH AS advil, aleve, motrin, ibuprofen, relafen, lodine, feldene, Diclofenac, voltaren, indomethacin, naproxen, celebrex, Mobic 7 days before surgery.       Please hold vitamins, supplements, CO-Q10, Glucosamine, herbs & fish oil 7 days before surgery. Vitamin C, D are ok to take.     It is OK to take acetaminophen (Tylenol) for pain around the time of surgery unless you have liver disease.      AFTER YOUR VISIT WITH [place], IF YOU START TAKING A NEW MEDICATION BEFORE SURGERY, PLEASE CALL us TO MAKE SURE IT IS SAFE TO TAKE & WILL NOT AFFECT YOUR SURGERY.         OSA INSTRUCTIONS:     KOP/HC/JMC: If you use a CPAP machine, please bring the entire machine, including mask and tubing, with you on the day of surgery.          Nothing to eat after midnight before your procedure, If your surgeon gave you shakes or carbohydrate ensure drinks to take before  surgery-follow those instructions. If you did not receive drinks then do not drink after midnight before your procedure except for sips of water with medications.      Preparing for your Surgery:     Please wear clean loose-fitting clothes and leave valuables at home   Do not shave or remove body hair. Facial shaving is permitted. If you are having head surgery, ask your doctor whether you can shave.  Bring a picture ID and your insurance card, and be prepared to pay your deductible or co-insurance by cash, check, or credit card when you arrive.   If you are going home after your surgery, please make sure to arrange for an adult to drive you home. You CANNOT use UBER or LYFT. If you do not have a ride, your surgery may be cancelled.       On The Day of Your Surgery:      Check in at the location mentioned above   COVID testing may be done on arrival to the Pre-op or Procedural areas according to the current CDPH mandate.   If you are a woman of child bearing age, please note that you may be asked to give a urine sample upon check-in  You will meet your anesthesia and surgery teams in the preoperative holding area before surgery.   Once surgery is over, you will wake up in the recovery room.  If you go home, an adult chaperone will need to stay with you for the first 24 hours after surgery.   Visitor policy during the [id number] pandemic is subject to change.No more that two visitors are allowed per patient. Current visitor policy can be found at [email or web address]      A video about what to expect for the day of surgery can be found here:    [email or web address]  Or by searching "You-tube" for [place] before surgery and [place] after surgery     You medical records are available to you at [email or web address] click sign up now.

## 2022-03-12 NOTE — Telephone Encounter (Signed)
The active order for the requested med was entered as historical (1 med)         Luan Moore, CPhT  (Rx Refill and [facility])      Biguanide Refill Protocol    Last visit in enc specialty: 12/18/2021     Recent Visits in This Encounter Department       Date Provider Department Visit Type Primary Dx    12/18/2021 Shellia Cleverly, MD [place] [facility] Visit Encounter for Medicare annual wellness exam    04/23/2021 Shellia Cleverly, MD [place] [facility] Visit Type 2 diabetes mellitus without complication, without long-term current use of insulin (CMS-HCC)           Population Health Visits  Recent [facility] Visits    None       Next f/u appt due:  Return in about 3 months (around 03/19/2022).   Next appt in enc specialty: Visit date not found      Future Appointments 03/12/2022 - 03/11/2027        Date Visit Type Department Provider     04/08/2022 10:30 AM POST OP MIN INV SURG [facility] Christianne Borrow, NP    Appointment Notes:     PO In clinic ventral hernia on 03/25/22             04/15/2022  1:00 PM [facility] - [facility] Damaris Schooner, MD    Appointment Notes:     Tremor             04/17/2022 10:00 AM RETURN TB/MAC [place] [facility] Katheren Shams, MD    Appointment Notes:     4 months follow up             04/29/2022  9:00 AM LVL 3 - [place] [facility] INFUSION, 4TH FLOOR    Appointment Notes:     IVIG 25G Q 4weeks Pt wants 4th Fl only             05/20/2022 12:30 PM [place] Gilda Crease, MD    Appointment Notes:     4 month f/u w MD             05/30/2022  1:00 PM [place], Genevieve Newman    Appointment Notes:     Ototoxic Monitoring             07/31/2022 10:15 AM RETURN IMMUNODEFICIENCY [place]  [facility] Maudie Mercury Bedelia Person, MD    Appointment Notes:     Immuno 6 mon f/u             08/30/2022  2:00 PM AUDIOGRAM [place] Otila Kluver    Appointment Notes:     Ototoxic Monitoring  Audio, OAE if time permits             [id number]  1:00 PM AUDIOGRAM  [facility] Otila Kluver    Appointment Notes:     1 year Audio, OAE if time permits                    Per OV note on 12/18/2021:  HPI: T2DM  - Regimen: Metformin 500 mg BID  - A1c 6.8% in 06/2021  -  Diet: Cravings for sugar at night, minimal takeout, homecooked food  - Exercise: walks 1.5 - 2 miles for 5-6 days/week  Microvascular complications:  - 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/2021, cataracts removed  - Diabetic nephropathy: Last urine micro albumin - none on file  - Diabetic neuropathy: Last monofilament - will perform today  Macrovascular complications:  - Peripheral vascular disease: N/A  - Coronary artery disease: N/A - Takes Ramipril, Atorvastatin  - Cerebrovascular events: N/A  A/P: Type 2 diabetes mellitus without complication without long term insulin use (CMS-HCC)  - Regimen: Metformin 500 mg BID  - A1c 6.8% in 06/2021, repeat A1c ordered  Microvascular complications:  - 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/2021, no DR; cataracts removed  - Diabetic nephropathy: Last urine micro albumin - Ordered  - Diabetic neuropathy: Last monofilament - normal foot exam in office today, 12/18/2021  Macrovascular complications:  - Peripheral vascular disease: N/A  - Coronary artery disease: N/A - Takes Ramipril, Atorvastatin  - Cerebrovascular events: N/A      LABS required:  (Q 37moHgbA1C) (Q year Creatinine, Lipid Panel(pop), ALT, Microalbumin)    Lab Results   Component Value Date    A1C 6.2 (H) 12/25/2021    A1C 6.8 (H) 06/26/2021    A1C 7.0 (H) 03/20/2021       No results found for: "A1CP"    Lab Results   Component Value Date    NA 139 03/07/2022    K 4.1 03/07/2022    CL 104 03/07/2022    BICARB  25 03/07/2022    BUN 19 03/07/2022    CREAT 0.87 03/07/2022       Lab Results   Component Value Date    GFRNON >60 11/06/2020    EGFRCKDEPI >60 03/07/2022       Lab Results   Component Value Date    CHOL 163 05/16/2021    TRIG 145 05/16/2021    HDL 53 05/16/2021    NHDLV 110 05/16/2021    LDLCALC 81 05/16/2021        Lab Results   Component Value Date    ALT 24 03/07/2022       No results found for: "MICALBCR"    Lab Results   Component Value Date    MICALB <1.2 12/25/2021       Lab Results   Component Value Date    MACRR Unable to calculate 12/25/2021       No results found for: "MICROALBUMIN", "MCRALCREAT"    Lab Results   Component Value Date    CREATUR 82 12/25/2021         Monitoring required:  (Q year BP)    (BP range: S0000000 [id number]  Blood Pressure   03/11/22 (!) 119/58   03/06/22 119/71   02/11/22 123/65         Last Digital Health Monitoring Vitals:         Last Digital Health Monitoring BG:        Last MyChart BP Values:        Last Pt Entered MyChart BP Values:               If information is available and meets protocol criteria: May ref[place] up to [facility] [place]

## 2022-03-12 NOTE — Anesthesia Preprocedure Evaluation (Addendum)
ANESTHESIA PRE-OPERATIVE EVALUATION    Patient Information    Name: Tiffany Chen    MRN: [id number]    DOB: [birth year]    Age: 77 year old    Sex: female  Procedure(s):  Open ventral hernia repair with component separation, possible insertion of mesh, possible adjacent tissue transfer, possible mesh, ostomy takedown  Open colostomy takedown with colorectal anastomosis, possible diverting loop ileostomy      Pre-op Vitals:   There were no vitals taken for this visit.        Primary language spoken:  English    ROS/Medical History:      History of Present Illness: 77 yo f sf;Open ventral hernia repair with component separation, possible insertion of mesh, possible adjacent tissue transfer, possible mesh, ostomy takedown  Hx ventral hernia  Dos 3/[facility] at [facility]   pmhx: history of bronchiectasis, cavitary MAC lung infection, ruptured diverticulitis s/p colostomy 2009, ICH s/p embolization, gerd , tremors, hld, niddm  A1c 6.2% 12/2021 labs reviewed     +eras       General:  negative for General ROS   Cardiovascular:  no CAD/Angina/MI/CABG/Stents, Anti-platelet drugs: No,    hypertension,  no dysrhythmias,  no pacemaker,     Anesthesia History:  negative anesthesia history ROS   Pulmonary:   no asthma,  no COPD,  no home oxygen use,  no sleep apnea,  no recent URI/pneumonia,  Dx 2018~-history of bronchiectasis, 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     Endorses sob when going up hills, walks several miles a week    Neuro/Psych:   negative for TIA/CVA,  no seizures,  psychiatric history,   Hematology/Oncology:   history of cancer (skin),      GI/Hepatic:  GERD,  bowel prep,   Infectious Disease:  negative for infectious disease  MAC    Renal:  negative renal ROS   Endocrine/Other:  diabetes, oral meds only,   Last dose metformin on Friday morning  12/25/21 09:38 Glycated Hemoglobin: 6.2 (H)   Pregnancy History:   Pediatrics:         Pre Anesthesia Testing (PCC/CPC) notes/comments:    [facility]  Test & records reviewed by [facility] Provider.                                        Physical Exam    Airway:     Inter-inciser distance 3-4 cm  Prognanth Able    Mallampati: II  Neck ROM: limited  TM distance: 5-6 cm            Cardiovascular:  - cardiovascular exam normal             Pulmonary:  - pulmonary exam normal              Neuro/Neck/Skeletal/Skin:  - Neck/Neuro/Skeletal/Skin exam normal          Dental:  - normal exam    Abdominal:   - normal exam           Additional Clinical Notes:               Last OSA (STOP BANG) Score:   No data recorded    Past Medical History:   Diagnosis Date    Acquired tracheobronchomegaly with bronchiectasis (CMS-HCC) 05/22/2020    Age-related nuclear cataract of left eye 10/15/2021  Cavitary lesion of lung 07/24/2020    Combined forms of age-related cataract of both eyes 10/15/2021    Depression     DM (diabetes mellitus) (CMS-HCC)     Gastroesophageal reflux disease, unspecified whether esophagitis present 10/12/2020    Added automatically from request for surgery [id number]    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     Past Surgical History:   Procedure Laterality Date    BRONCHOSCOPY  12/2020    CESAREAN SECTION, LOW TRANSVERSE      COLOSTOMY      CRANIOTOMY      LUNG LOBECTOMY       Social History     Socioeconomic History    Marital status: Widowed   Tobacco Use    Smoking status: Never    Smokeless tobacco: Never   Vaping Use    Vaping Use: Never used   Substance and Sexual Activity    Alcohol use: Yes      Comment: glass of wine every other month    Drug use: Not Currently    Sexual activity: Not Currently   Other Topics Concern    Military Service No    Blood Transfusions No    Caffeine Concern No    Occupational Exposure No    Hobby Hazards Yes     Comment: Gardening, MAC    Sleep Concern No    Stress Concern Yes     Comment: Husband death Cov19, 2020, sold home, rehomed pets. [place]    Weight Concern No    Special Diet Yes    Back Care No    Exercises Regularly Yes    Bike Helmet Use No    Seat Belt Use No    Performs Self-Exams No   Social History Narrative    Lives with brother Jeneen Rinks at home. Sister also lives in [place]: No Food Insecurity (12/17/2021)    Hunger Vital Sign     Worried About Running Out of Food in the Last Year: Never true     Ran Out of Food in the Last Year: Never true   Physical Activity: Sufficiently Active (12/17/2021)    Exercise is Medicine (EIM)     Days of Exercise per Week: 5 days     Minutes of Exercise per Session: 60 min   Intimate Partner Violence: Low Risk  (03/20/2021)    [place] IPV     Have you ever been emotionally or physically abused by your partner or someone important to you?: No     Within the last year have you been hit, slapped, kicked or otherwise physically hurt by someone?: No     Since you've been pregnant, have you been slapped, kicked or otherwise physically hurt by someone?: N/A     Within the last year has anyone forced you to have sexual activities?: No     Are you afraid of your spouse or partner you listed above?: No     Alcohol Use: Not on  file       Current Outpatient Medications   Medication Sig Dispense Refill    ARIKAYCE 590 MG/8.4ML SUSP Inhale the contents of one vial (590mg ) via Lamira device 1 time daily as directed. 28 each 5    atorvastatin (LIPITOR) 10 MG tablet Take 1 tablet (10 mg) by mouth daily. 90 tablet 2    azithromycin (ZITHROMAX) 250 MG tablet TAKE 1 TABLET BY MOUTH  DAILY 60 tablet  11    CLOFAZIMINE IRB 21-0063 50 MG CAPSULE Take 2 capsules (100mg ) by mouth once daily with food as directed. 200 capsule 0    CLOFAZIMINE IRB 21-0063 50 MG CAPSULE Take 2 capsules (100mg ) by mouth once daily with food as directed. 200 capsule 0    ethambutol (MYAMBUTOL) 400 MG tablet TAKE 2 TABLETS BY MOUTH  DAILY 120 tablet 11    ferrous sulfate 325 (65 Fe) MG tablet Take 1 tablet (325 mg) by mouth 3 times daily.      fluoxetine (PROZAC) 40 MG capsule Take 1 capsule (40 mg) by mouth daily. 90 capsule 3    metFORMIN (GLUCOPHAGE) 500 mg tablet Take 1 tablet (500 mg) by mouth 2 times daily (with meals).      moxifloxacin (VIGAMOX) 0.5 % ophthalmic solution 1 drop to the surgical eye qid. Start 2 days before surgery 5 mL 3    omeprazole (PRILOSEC) 20 MG capsule TAKE 1 CAPSULE BY MOUTH IN  THE MORNING BEFORE  BREAKFAST 90 capsule 3    Pediatric Multivitamins-Fl (MULTIVITAMINS/FL PO)       ramipril (ALTACE) 2.5 MG capsule Take 1 capsule (2.5 mg) by mouth daily. 90 capsule 1    rifAMPin (RIFADIN) 300 MG capsule TAKE 2 CAPSULES BY MOUTH  DAILY 120 capsule 11    sodium chloride 7 % NEBU USE 1 VIAL VIA NEBULIZER  EVERY 12 HOURS 720 mL 3    umeclidinium-vilanterol (ANORO ELLIPTA) 62.5-25 MCG/ACT inhaler Inhale 1 puff by mouth daily. 60 each 11    VENTOLIN HFA 108 (90 Base) MCG/ACT inhaler USE 2 INHALATIONS BY MOUTH  EVERY 6 HOURS AS NEEDED FOR WHEEZING 72 g 3     Current Facility-Administered Medications   Medication Dose Route Frequency Provider Last Rate Last Admin    lidocaine (XYLOCAINE) 2 % viscous solution 1 mL  1 mL Other PRN Broderick, Ethelle Lyon, MD        lidocaine (XYLOCAINE) 4 % topical   Other PRN Diona Browner, MD         Allergies   Allergen Reactions    Walnuts Swelling     Tongue swollen and burn       Labs and Other Data  Lab Results   Component Value Date    NA 139 03/07/2022    K 4.1 03/07/2022    CL 104 03/07/2022    BICARB 25 03/07/2022    BUN 19 03/07/2022    CREAT 0.87 03/07/2022    GLU 195  (H) 03/07/2022    Humphreys 9.6 03/07/2022     Lab Results   Component Value Date    AST 25 03/07/2022    ALT 24 03/07/2022    ALK 62 03/07/2022    TP 6.9 03/07/2022    ALB 4.1 03/07/2022    TBILI 0.23 03/07/2022    DBILI <0.2 07/17/2020     Lab Results   Component Value Date    WBC 5.7 03/07/2022    RBC 3.91 03/07/2022    HGB 12.4 03/07/2022  HCT 36.5 03/07/2022    MCV 93.4 03/07/2022    MCHC 34.0 03/07/2022    RDW 13.4 03/07/2022    PLT 212 03/07/2022    MPV 10.7 03/07/2022    SEG 61 03/07/2022    LYMPHS 23 03/07/2022    MONOS 11 03/07/2022    EOS 4 03/07/2022    BASOS 1 03/07/2022     No results found for: "INR", "PTT"  No results found for: "ARTPH", "ARTPO2", "ARTPCO2"    Anesthesia Plan:  Risks and Benefits of Anesthesia  I have personally performed an appropriate pre-anesthesia physical exam of the patient (including heart, lungs, and airway) prior to the anesthetic and reviewed the pertinent medical history, drug and allergy history, laboratory and imaging studies and consultations.   I have determined that the patient has had adequate assessment and testing.  I have validated the documentation of these elements of the patient exam and/or have made necessary changes to reflect my own observations during my pre-anesthesia exam.  Anesthetic techniques, invasive monitors, anesthetic drugs for induction, maintenance and post-operative analgesia, risks and alternatives have been explained to the patient and/or patient's representatives.    I have prescribed the anesthetic plan:         Planned anesthesia method: General         ASA 3 (Severe systemic disease)     Potential anesthesia problems identified and risks including but not limited to the following were discussed with patient and/or patient's representative: Recall, Ocular injury, Dental injury or sore throat, Nerve injury and Injury to brain, heart and other organs    No Beta Blocker Indicated:     Planned monitoring method: Routine monitoring    Informed  Consent:  Anesthetic plan and risks discussed with Patient.  Use of blood products discussed with patient who consented to blood products.         Plan discussed with CRNA and Attending.

## 2022-03-13 MED ORDER — METFORMIN HCL 500 MG OR TABS
500.0000 mg | ORAL_TABLET | Freq: Two times a day (BID) | ORAL | 1 refills | Status: DC
Start: 2022-03-13 — End: 2022-07-09

## 2022-03-13 NOTE — Telephone Encounter (Signed)
Scheduling Request:   Tiffany Chen     Please remind pt to schedule f/u appt w Dr Tama Headings, Suzanna Obey, for 3 mo follow up (video visit if appropriate), due on or after 03/19/2022.     *Please inform pt to wait to complete any ordered labs until AFTER their scheduled visit, unless otherwise instructed by their provider*    Thank you.

## 2022-03-13 NOTE — Telephone Encounter (Signed)
Left message for patient to call back.  Call Center agent please assist with:  • Relaying Message  • Book f/u appointment w/pcp  • Verify best way to reach patient  Please don't create new encounter.  Thank you.

## 2022-03-18 NOTE — Addendum Note (Signed)
Encounter addended by: Molly Maduro on: 03/18/2022 9:07 AM   Actions taken: Flowsheet accepted, Charge Capture section accepted

## 2022-03-19 ENCOUNTER — Other Ambulatory Visit: Payer: Self-pay

## 2022-03-19 MED ORDER — CLOFAZIMINE IRB 21-0063 50 MG CAPSULE
0 refills | Status: DC
Start: 2022-03-19 — End: 2022-06-13
  Filled 2022-03-19: qty 200, 100d supply, fill #0

## 2022-03-22 MED ORDER — CHOLECALCIFEROL 5000 UNIT OR CAPS: 1.00 | ORAL_CAPSULE | ORAL | Status: AC

## 2022-03-22 MED ORDER — LORATADINE 10 MG OR TABS: ORAL_TABLET | ORAL | Status: AC

## 2022-03-25 ENCOUNTER — Inpatient Hospital Stay (HOSPITAL_COMMUNITY): Payer: Medicare Other | Admitting: Nurse Practitioner

## 2022-03-25 ENCOUNTER — Inpatient Hospital Stay (HOSPITAL_COMMUNITY): Payer: Medicare Other | Admitting: Anesthesiology

## 2022-03-25 ENCOUNTER — Encounter (HOSPITAL_COMMUNITY): Admission: RE | Disposition: A | Discharge status: Home Health | Payer: Self-pay | Attending: General Surgery

## 2022-03-25 ENCOUNTER — Inpatient Hospital Stay
Admission: RE | Admit: 2022-03-25 | Discharge: 2022-04-03 | DRG: 331 | Disposition: A | Discharge status: Home Health | Payer: Medicare Other | Attending: Surgery | Admitting: Surgery

## 2022-03-25 DIAGNOSIS — K529 Noninfective gastroenteritis and colitis, unspecified: Secondary | ICD-10-CM | POA: Diagnosis present

## 2022-03-25 DIAGNOSIS — Z9841 Cataract extraction status, right eye: Secondary | ICD-10-CM

## 2022-03-25 DIAGNOSIS — Z91018 Allergy to other foods: Secondary | ICD-10-CM

## 2022-03-25 DIAGNOSIS — Z800 Family history of malignant neoplasm of digestive organs: Secondary | ICD-10-CM

## 2022-03-25 DIAGNOSIS — K660 Peritoneal adhesions (postprocedural) (postinfection): Secondary | ICD-10-CM

## 2022-03-25 DIAGNOSIS — Z79899 Other long term (current) drug therapy: Secondary | ICD-10-CM

## 2022-03-25 DIAGNOSIS — Z9049 Acquired absence of other specified parts of digestive tract: Secondary | ICD-10-CM

## 2022-03-25 DIAGNOSIS — E785 Hyperlipidemia, unspecified: Secondary | ICD-10-CM | POA: Diagnosis present

## 2022-03-25 DIAGNOSIS — Z932 Ileostomy status: Secondary | ICD-10-CM

## 2022-03-25 DIAGNOSIS — R490 Dysphonia: Secondary | ICD-10-CM | POA: Diagnosis present

## 2022-03-25 DIAGNOSIS — Z85828 Personal history of other malignant neoplasm of skin: Secondary | ICD-10-CM

## 2022-03-25 DIAGNOSIS — K435 Parastomal hernia without obstruction or  gangrene: Principal | ICD-10-CM | POA: Diagnosis present

## 2022-03-25 DIAGNOSIS — Z7409 Other reduced mobility: Secondary | ICD-10-CM

## 2022-03-25 DIAGNOSIS — Z961 Presence of intraocular lens: Secondary | ICD-10-CM | POA: Diagnosis present

## 2022-03-25 DIAGNOSIS — Z9842 Cataract extraction status, left eye: Secondary | ICD-10-CM

## 2022-03-25 DIAGNOSIS — Z98891 History of uterine scar from previous surgery: Secondary | ICD-10-CM

## 2022-03-25 DIAGNOSIS — F419 Anxiety disorder, unspecified: Secondary | ICD-10-CM | POA: Diagnosis not present

## 2022-03-25 DIAGNOSIS — Z823 Family history of stroke: Secondary | ICD-10-CM

## 2022-03-25 DIAGNOSIS — Z8619 Personal history of other infectious and parasitic diseases: Secondary | ICD-10-CM

## 2022-03-25 DIAGNOSIS — Z8719 Personal history of other diseases of the digestive system: Principal | ICD-10-CM | POA: Diagnosis present

## 2022-03-25 DIAGNOSIS — I10 Essential (primary) hypertension: Secondary | ICD-10-CM

## 2022-03-25 DIAGNOSIS — Z933 Colostomy status: Secondary | ICD-10-CM

## 2022-03-25 DIAGNOSIS — Z833 Family history of diabetes mellitus: Secondary | ICD-10-CM

## 2022-03-25 DIAGNOSIS — Z8042 Family history of malignant neoplasm of prostate: Secondary | ICD-10-CM

## 2022-03-25 DIAGNOSIS — Z818 Family history of other mental and behavioral disorders: Secondary | ICD-10-CM

## 2022-03-25 DIAGNOSIS — K219 Gastro-esophageal reflux disease without esophagitis: Secondary | ICD-10-CM | POA: Diagnosis present

## 2022-03-25 DIAGNOSIS — Z789 Other specified health status: Principal | ICD-10-CM

## 2022-03-25 DIAGNOSIS — Z902 Acquired absence of lung [part of]: Secondary | ICD-10-CM

## 2022-03-25 DIAGNOSIS — E119 Type 2 diabetes mellitus without complications: Secondary | ICD-10-CM

## 2022-03-25 DIAGNOSIS — R32 Unspecified urinary incontinence: Secondary | ICD-10-CM | POA: Diagnosis not present

## 2022-03-25 DIAGNOSIS — Z808 Family history of malignant neoplasm of other organs or systems: Secondary | ICD-10-CM

## 2022-03-25 DIAGNOSIS — Z7984 Long term (current) use of oral hypoglycemic drugs: Secondary | ICD-10-CM

## 2022-03-25 DIAGNOSIS — R251 Tremor, unspecified: Secondary | ICD-10-CM | POA: Diagnosis present

## 2022-03-25 DIAGNOSIS — K439 Ventral hernia without obstruction or gangrene: Secondary | ICD-10-CM

## 2022-03-25 DIAGNOSIS — Z8249 Family history of ischemic heart disease and other diseases of the circulatory system: Secondary | ICD-10-CM

## 2022-03-25 DIAGNOSIS — E1169 Type 2 diabetes mellitus with other specified complication: Secondary | ICD-10-CM | POA: Diagnosis present

## 2022-03-25 DIAGNOSIS — K624 Stenosis of anus and rectum: Secondary | ICD-10-CM | POA: Diagnosis present

## 2022-03-25 DIAGNOSIS — Z433 Encounter for attention to colostomy: Secondary | ICD-10-CM

## 2022-03-25 DIAGNOSIS — E875 Hyperkalemia: Secondary | ICD-10-CM | POA: Diagnosis not present

## 2022-03-25 DIAGNOSIS — K432 Incisional hernia without obstruction or gangrene: Secondary | ICD-10-CM | POA: Diagnosis present

## 2022-03-25 DIAGNOSIS — Z801 Family history of malignant neoplasm of trachea, bronchus and lung: Secondary | ICD-10-CM

## 2022-03-25 LAB — BASIC METABOLIC PANEL, BLOOD
Anion Gap: 9 mmol/L (ref 7–15)
BUN: 13 mg/dL (ref 8–23)
Bicarbonate: 22 mmol/L (ref 22–29)
Calcium: 8.3 mg/dL — ABNORMAL LOW (ref 8.5–10.6)
Chloride: 107 mmol/L (ref 98–107)
Creatinine: 0.77 mg/dL (ref 0.51–0.95)
Glucose: 191 mg/dL — ABNORMAL HIGH (ref 70–99)
Potassium: 4.8 mmol/L (ref 3.5–5.1)
Sodium: 138 mmol/L (ref 136–145)
eGFR Based on CKD-EPI 2021 Equation: >60 mL/min/{1.73_m2}

## 2022-03-25 LAB — MAGNESIUM, BLOOD: Magnesium: 1.7 mg/dL (ref 1.6–2.4)

## 2022-03-25 LAB — CBC WITH DIFF, BLOOD
ANC-Automated: 13.1 10*3/uL — ABNORMAL HIGH (ref 1.6–7.0)
Abs Basophils: 0 10*3/uL (ref <0.2)
Abs Eosinophils: 0 10*3/uL (ref 0.0–0.5)
Abs Lymphs: 0.6 10*3/uL — ABNORMAL LOW (ref 0.8–3.1)
Abs Monos: 1.1 10*3/uL — ABNORMAL HIGH (ref 0.2–0.8)
Basophils: 0 %
Eosinophils: 0 %
Hct: 39.6 % (ref 34.0–45.0)
Hgb: 13.1 gm/dL (ref 11.2–15.7)
Lymphocytes: 4 %
MCH: 31.3 pg (ref 26.0–32.0)
MCHC: 33.1 g/dL (ref 32.0–36.0)
MCV: 94.7 um3 (ref 79.0–95.0)
MPV: 10.5 fL (ref 9.4–12.4)
Monocytes: 8 %
Plt Count: 259 10*3/uL (ref 140–370)
RBC: 4.18 10*6/uL (ref 3.90–5.20)
RDW: 12.9 % (ref 12.0–14.0)
Segs: 88 %
WBC: 14.9 10*3/uL — ABNORMAL HIGH (ref 4.0–10.0)

## 2022-03-25 LAB — GLUCOSE (POCT)
Glucose (POCT): 244 mg/dL — ABNORMAL HIGH (ref 70–99)
Glucose (POCT): 182 mg/dL — ABNORMAL HIGH (ref 70–99)
Glucose (POCT): 215 mg/dL — ABNORMAL HIGH (ref 70–99)
Glucose (POCT): 189 mg/dL — ABNORMAL HIGH (ref 70–99)
Glucose (POCT): 181 mg/dL — ABNORMAL HIGH (ref 70–99)
Glucose (POCT): 225 mg/dL — ABNORMAL HIGH (ref 70–99)
Glucose (POCT): 241 mg/dL — ABNORMAL HIGH (ref 70–99)

## 2022-03-25 LAB — RBC MORPHOLOGY
Plt Est: ADEQUATE
RBC Comment: NORMAL

## 2022-03-25 LAB — PHOSPHORUS, BLOOD: Phosphorous: 4.5 mg/dL (ref 2.7–4.5)

## 2022-03-25 LAB — AFB CULTURE W/STAIN
AFB Culture Result: NO GROWTH
AFB Smear Result: NEGATIVE

## 2022-03-25 SURGERY — REPAIR, HERNIA, VENTRAL, USING COMPONENT SEPARATION TECHNIQUE
Anesthesia: General | Site: Esophagus | Wound class: Class III (Contaminated)

## 2022-03-25 MED ORDER — NALOXONE HCL 0.4 MG/ML IJ SOLN
0.1000 mg | INTRAMUSCULAR | Status: DC | PRN
Start: 2022-03-25 — End: 2022-04-03

## 2022-03-25 MED ORDER — LACTATED RINGERS IV SOLN
INTRAVENOUS | Status: DC
Start: 2022-03-25 — End: 2022-03-25

## 2022-03-25 MED ORDER — INSULIN LISPRO (HUMAN) 100 UNIT/ML SC SOLN (CUSTOM)
INTRAMUSCULAR | Status: AC
Start: 2022-03-25 — End: ?
  Filled 2022-03-25: qty 1

## 2022-03-25 MED ORDER — ACETAMINOPHEN 10 MG/ML IV SOLN
1000.0000 mg | Freq: Three times a day (TID) | INTRAVENOUS | Status: AC
Start: 2022-03-25 — End: 2022-03-26
  Administered 2022-03-25 – 2022-03-26 (×3): 1000 mg via INTRAVENOUS
  Filled 2022-03-25 (×3): qty 100

## 2022-03-25 MED ORDER — LACTATED RINGERS IV SOLN
INTRAVENOUS | Status: DC | PRN
Start: 2022-03-25 — End: 2022-03-25

## 2022-03-25 MED ORDER — LIDOCAINE HCL (PF) 1 % IJ SOLN
0.1000 mL | Freq: Once | INTRAMUSCULAR | Status: DC | PRN
Start: 2022-03-25 — End: 2022-03-25

## 2022-03-25 MED ORDER — MIDAZOLAM HCL 2 MG/2ML IJ SOLN
INTRAMUSCULAR | Status: DC | PRN
Start: 2022-03-25 — End: 2022-03-25
  Administered 2022-03-25: 2 mg via INTRAVENOUS

## 2022-03-25 MED ORDER — HYDROMORPHONE HCL 1 MG/ML IJ SOLN
0.5000 mg | INTRAMUSCULAR | Status: DC | PRN
Start: 2022-03-25 — End: 2022-03-25

## 2022-03-25 MED ORDER — HEPARIN SODIUM (PORCINE) 5000 UNIT/ML IJ SOLN
INTRAMUSCULAR | Status: DC | PRN
Start: 2022-03-25 — End: 2022-03-25
  Administered 2022-03-25: 5000 [IU] via SUBCUTANEOUS

## 2022-03-25 MED ORDER — FENTANYL CITRATE (PF) 100 MCG/2ML IJ SOLN
INTRAMUSCULAR | Status: AC
Start: 2022-03-25 — End: ?
  Filled 2022-03-25: qty 2

## 2022-03-25 MED ORDER — EPHEDRINE SULFATE (PRESSORS) 5 MG/ML IV SOLN
INTRAVENOUS | Status: DC | PRN
Start: 2022-03-25 — End: 2022-03-25
  Administered 2022-03-25: 10 mg via INTRAVENOUS
  Administered 2022-03-25: 5 mg via INTRAVENOUS
  Administered 2022-03-25: 10 mg via INTRAVENOUS

## 2022-03-25 MED ORDER — GLYCOPYRROLATE 1 MG/5ML IJ SOLN
INTRAMUSCULAR | Status: DC | PRN
Start: 2022-03-25 — End: 2022-03-25
  Administered 2022-03-25: .9 mg via INTRAVENOUS

## 2022-03-25 MED ORDER — FAMOTIDINE (PF) 20 MG/2ML IV SOLN
INTRAVENOUS | Status: DC | PRN
Start: 2022-03-25 — End: 2022-03-25
  Administered 2022-03-25: 20 mg via INTRAVENOUS

## 2022-03-25 MED ORDER — ERTAPENEM SODIUM 1 GM IJ SOLR
INTRAMUSCULAR | Status: DC | PRN
Start: 2022-03-25 — End: 2022-03-25
  Administered 2022-03-25: 1000 mg via INTRAVENOUS

## 2022-03-25 MED ORDER — HYDROMORPHONE HCL 2 MG/ML IJ SOLN
INTRAMUSCULAR | Status: AC
Start: 2022-03-25 — End: ?
  Filled 2022-03-25: qty 1

## 2022-03-25 MED ORDER — MEPERIDINE HCL 25 MG/ML IJ SOLN
12.5000 mg | INTRAMUSCULAR | Status: DC | PRN
Start: 2022-03-25 — End: 2022-03-25

## 2022-03-25 MED ORDER — DIPHENHYDRAMINE HCL 50 MG/ML IJ SOLN
25.0000 mg | INTRAMUSCULAR | Status: DC | PRN
Start: 2022-03-25 — End: 2022-03-31

## 2022-03-25 MED ORDER — HYDROMORPHONE HCL 1 MG/ML IJ SOLN
1.0000 mg | INTRAMUSCULAR | Status: DC | PRN
Start: 2022-03-25 — End: 2022-03-25

## 2022-03-25 MED ORDER — FENTANYL CITRATE (PF) 250 MCG/5ML IJ SOLN
INTRAMUSCULAR | Status: DC | PRN
Start: 2022-03-25 — End: 2022-03-25
  Administered 2022-03-25 (×2): 50 ug via INTRAVENOUS

## 2022-03-25 MED ORDER — FENTANYL CITRATE (PF) 50 MCG/ML IJ SOLN (WRAPPED RECORD) ~~LOC~~
50.0000 ug | INTRAMUSCULAR | Status: DC | PRN
Start: 2022-03-25 — End: 2022-03-25

## 2022-03-25 MED ORDER — DEXTROSE 50 % IV SOLN
12.5000 g | INTRAVENOUS | Status: DC | PRN
Start: 2022-03-25 — End: 2022-03-25

## 2022-03-25 MED ORDER — MIDAZOLAM HCL 2 MG/2ML IJ SOLN
INTRAMUSCULAR | Status: AC
Start: 2022-03-25 — End: ?
  Filled 2022-03-25: qty 2

## 2022-03-25 MED ORDER — LIDOCAINE 4 % EX PTCH
2.0000 | MEDICATED_PATCH | CUTANEOUS | Status: DC
Start: 2022-03-25 — End: 2022-04-03
  Administered 2022-03-26 – 2022-04-03 (×9): 2 via TRANSDERMAL
  Filled 2022-03-25 (×9): qty 2

## 2022-03-25 MED ORDER — INDOCYANINE GREEN DILUTION 2.5 MG/ML IJ SOLN (CUSTOM)
Status: DC | PRN
Start: 2022-03-25 — End: 2022-03-25
  Administered 2022-03-25: 7.5 mg via INTRAVENOUS

## 2022-03-25 MED ORDER — DIPHENHYDRAMINE HCL 50 MG/ML IJ SOLN
12.5000 mg | Freq: Once | INTRAMUSCULAR | Status: DC | PRN
Start: 2022-03-25 — End: 2022-03-25

## 2022-03-25 MED ORDER — LACTATED RINGERS IV SOLN
INTRAVENOUS | Status: AC
Start: 2022-03-25 — End: ?

## 2022-03-25 MED ORDER — DIPHENHYDRAMINE HCL 50 MG/ML IJ SOLN
INTRAMUSCULAR | Status: DC | PRN
Start: 2022-03-25 — End: 2022-03-25
  Administered 2022-03-25: 25 mg via INTRAVENOUS

## 2022-03-25 MED ORDER — PROPOFOL 1000 MG/100ML IV EMUL
INTRAVENOUS | Status: DC | PRN
Start: 2022-03-25 — End: 2022-03-25
  Administered 2022-03-25: 20 ug/kg/min via INTRAVENOUS

## 2022-03-25 MED ORDER — ACETAMINOPHEN 325 MG PO TABS
975.0000 mg | ORAL_TABLET | Freq: Once | ORAL | Status: AC
Start: 2022-03-25 — End: 2022-03-25
  Administered 2022-03-25: 975 mg via ORAL
  Filled 2022-03-25: qty 3

## 2022-03-25 MED ORDER — LIDOCAINE HCL 2 % IJ SOLN WRAPPED RECORD
INTRAMUSCULAR | Status: DC | PRN
Start: 2022-03-25 — End: 2022-03-25
  Administered 2022-03-25: 100 mg via INTRAMUSCULAR

## 2022-03-25 MED ORDER — ACETAMINOPHEN 10 MG/ML IV SOLN
1000.0000 mg | Freq: Once | INTRAVENOUS | Status: DC
Start: 2022-03-25 — End: 2022-03-25

## 2022-03-25 MED ORDER — HYDROMORPHONE PCA 0.2 MG/ML SYRINGE
INTRAMUSCULAR | Status: DC
Start: 2022-03-25 — End: 2022-03-30
  Filled 2022-03-25 (×3): qty 50

## 2022-03-25 MED ORDER — HYDROMORPHONE IV BOLUS FROM PCA PUMP
0.4000 mg | INTRAMUSCULAR | Status: DC | PRN
Start: 2022-03-25 — End: 2022-03-30

## 2022-03-25 MED ORDER — ONDANSETRON HCL 4 MG/2ML IV SOLN
INTRAMUSCULAR | Status: DC | PRN
Start: 2022-03-25 — End: 2022-03-25
  Administered 2022-03-25: 4 mg via INTRAVENOUS

## 2022-03-25 MED ORDER — PROPOFOL 200 MG/20ML IV EMUL
INTRAVENOUS | Status: DC | PRN
Start: 2022-03-25 — End: 2022-03-25
  Administered 2022-03-25: 130 mg via INTRAVENOUS

## 2022-03-25 MED ORDER — HYDROMORPHONE HCL 1 MG/ML IJ SOLN
0.2000 mg | INTRAMUSCULAR | Status: DC | PRN
Start: 2022-03-25 — End: 2022-03-25

## 2022-03-25 MED ORDER — MORPHINE SULFATE (PF) 0.5 MG/ML IJ SOLN
INTRAMUSCULAR | Status: AC
Start: 2022-03-25 — End: ?
  Filled 2022-03-25: qty 10

## 2022-03-25 MED ORDER — MENTHOL 3 MG MT LOZG
1.0000 | LOZENGE | OROMUCOSAL | Status: DC | PRN
Start: 2022-03-25 — End: 2022-03-25
  Administered 2022-03-25: 1 via BUCCAL
  Filled 2022-03-25: qty 1

## 2022-03-25 MED ORDER — ONDANSETRON HCL 4 MG/2ML IV SOLN
4.0000 mg | Freq: Once | INTRAMUSCULAR | Status: DC | PRN
Start: 2022-03-25 — End: 2022-03-25

## 2022-03-25 MED ORDER — DEXTROSE-KCL-NACL 5-20-0.45 %-MEQ/L-% IV SOLN
INTRAVENOUS | Status: DC
Start: 2022-03-25 — End: 2022-03-26

## 2022-03-25 MED ORDER — NALOXONE HCL 0.4 MG/ML IJ SOLN
0.1000 mg | INTRAMUSCULAR | Status: DC | PRN
Start: 2022-03-25 — End: 2022-03-25

## 2022-03-25 MED ORDER — INSULIN LISPRO (HUMAN) 100 UNIT/ML SC SOLN (CUSTOM)
1.0000 [IU] | INTRAMUSCULAR | Status: DC | PRN
Start: 2022-03-25 — End: 2022-03-25
  Administered 2022-03-25 (×2): 1 [IU] via SUBCUTANEOUS
  Administered 2022-03-25: 3 [IU] via SUBCUTANEOUS
  Administered 2022-03-25: 1 [IU] via SUBCUTANEOUS
  Filled 2022-03-25 (×2): qty 1

## 2022-03-25 MED ORDER — CISATRACURIUM BESYLATE 20 MG/10ML IV SOLN
INTRAVENOUS | Status: DC | PRN
Start: 2022-03-25 — End: 2022-03-25
  Administered 2022-03-25: 2 mg via INTRAVENOUS
  Administered 2022-03-25: 4 mg via INTRAVENOUS

## 2022-03-25 MED ORDER — CISATRACURIUM BESYLATE 20 MG/10ML IV SOLN
INTRAVENOUS | Status: AC
Start: 2022-03-25 — End: ?
  Filled 2022-03-25: qty 20

## 2022-03-25 MED ORDER — NALBUPHINE HCL 10 MG/ML IJ SOLN
1.5000 mg | INTRAMUSCULAR | Status: DC | PRN
Start: 2022-03-25 — End: 2022-03-25

## 2022-03-25 MED ORDER — FENTANYL CITRATE (PF) 50 MCG/ML IJ SOLN (WRAPPED RECORD) ~~LOC~~
25.0000 ug | INTRAMUSCULAR | Status: DC | PRN
Start: 2022-03-25 — End: 2022-03-25

## 2022-03-25 MED ORDER — ROCURONIUM BROMIDE 100 MG/10ML IV SOLN
INTRAVENOUS | Status: DC | PRN
Start: 2022-03-25 — End: 2022-03-25
  Administered 2022-03-25: 20 mg via INTRAVENOUS
  Administered 2022-03-25: 40 mg via INTRAVENOUS
  Administered 2022-03-25: 30 mg via INTRAVENOUS
  Administered 2022-03-25: 60 mg via INTRAVENOUS
  Administered 2022-03-25: 20 mg via INTRAVENOUS

## 2022-03-25 MED ORDER — DEXMEDETOMIDINE HCL 200 MCG/2ML IV SOLN
INTRAVENOUS | Status: DC | PRN
Start: 2022-03-25 — End: 2022-03-25
  Administered 2022-03-25: 4 ug via INTRAVENOUS

## 2022-03-25 MED ORDER — MORPHINE SULFATE (PF) 1 MG/ML IJ SOLN
INTRAMUSCULAR | Status: DC | PRN
Start: 2022-03-25 — End: 2022-03-25
  Administered 2022-03-25: .25 mg via INTRATHECAL

## 2022-03-25 MED ORDER — ACETAMINOPHEN 10 MG/ML IV SOLN
1000.0000 mg | Freq: Once | INTRAVENOUS | Status: DC | PRN
Start: 2022-03-25 — End: 2022-03-25

## 2022-03-25 MED ORDER — NEOSTIGMINE METHYLSULFATE 10 MG/10ML IV SOLN
INTRAVENOUS | Status: DC | PRN
Start: 2022-03-25 — End: 2022-03-25
  Administered 2022-03-25: 3.5 mg via INTRAVENOUS

## 2022-03-25 SURGICAL SUPPLY — 134 items
APPLICATOR CHLORAPREP 26ML, ~~LOC~~ (Prep Solutions) ×3 IMPLANT
APPLICATOR VISTASEAL LAPARASCOPIC DUAL 35CM RIDGID (Hemostatic agents/wax/sealants-absorbable) IMPLANT
APPLICATOR VISTASEAL LAPARASCOPIC DUAL 35CM RIDGID BX/3 (Hemostatic agents/wax/sealants-absorbable)
APPLIER CLIP MEDIUM LIGACLIP (Staplers and staple reloads) IMPLANT
APPLIER ENDOSCOPIC LIGACLIP 12-L 12MM W/20 TI LG CLIPS ER420 (Staplers and staple reloads) IMPLANT
BINDER ABDOMINAL 12X46-62 (External orthotic collars/braces/supports) ×3
BLADE SURGEON #10 STERILE (Knives/Blades) ×3 IMPLANT
BLADE SURGEON #11 STERILE (Knives/Blades) ×3 IMPLANT
BLADE SURGEON #15 STERILE (Knives/Blades) ×6 IMPLANT
CANISTER WOUND VAC 500ML WITHOUT GEL (Wound Vac Supply) ×3
CATHETER ROBINSON 16FR (Lines/Drains) ×3 IMPLANT
CAUTERY BOVIE ROCKER SWITCH PENCIL (Cautery) ×3 IMPLANT
CAUTERY TIP EDGE BLADE ELECTRODE 2.75", INSULATED (Cautery) ×3 IMPLANT
CAUTERY TIP EDGE BLADE ELECTRODE 6.5" (Cautery) ×3 IMPLANT
CLEARIFY SYSTEM VISUALIZATION SCOPE WARMER D-HELP (Lap/Endo/Arthroscopy) ×3 IMPLANT
CLIP APPLIER LIGAMAX 5MM W/ 15 TI CLIPS (Staplers and staple reloads) IMPLANT
CLIP HEM-O-LOK LARGE (Staplers and staple reloads) ×3 IMPLANT
CLIP HEM-O-LOK MED/LGE (Staplers and staple reloads) IMPLANT
CLIPPER BLADE ASSY FOR 9670 CLIPPER (Patient Care Supply) ×3
COUNTER NEEDLE SHARPS DISP (Needles/punch/cannula/biopsy) ×3 IMPLANT
COVER LIGHT HANDLE RIGID (2 PACK) (Misc Surgical Supply) ×3 IMPLANT
COVER MAYO STAND 23 X 54" (Drapes/towels) ×6
DERMABOND ADVANCE 0.7ML (Dressings/packing) IMPLANT
DEVICE CLOSURE ENDO CLOSE AUTO SUTURE TROCAR (Lap/Endo/Arthroscopy) IMPLANT
DRAIN HUBLESS ROUND 19FR 1/4 (Lines/Drains) ×6
DRAIN PENROSE 1/2" X 18" STRL LTX (Lines/Drains) IMPLANT
DRAIN PENROSE 3/8" X 18" STRL LTX (Lines/Drains) ×3 IMPLANT
DRAIN SUCTION EVAC RELIAVAC 100CC (Lines/Drains) ×6 IMPLANT
DRAPE 3M STERI-DRAPE 35" X 35" (Drapes/towels) IMPLANT
DRAPE ENDOSCOPY GENERAL 269CM X 314CM (Drapes/towels) IMPLANT
DRAPE IOBAN 2 ANTIMICROBIAL 23" X 17" (Misc Surgical Supply) ×3
DRAPE LITHOTOMY LEGGING W/CUFF 31" X 48" (Drapes/towels) ×3 IMPLANT
DRAPE ORTHO BAR SHEET 100 X 60" (Drapes/towels) ×3 IMPLANT
DRAPE POUCH STERI INST 7X11 (Drapes/towels) ×3
DRAPE POUCH STERI INST 7X11 1018 (Drapes/towels) ×3 IMPLANT
DRAPE UNDER BUTTOCK WITH FLUID COLLECTION POUCH (Drapes/towels) ×3 IMPLANT
DRESSING MEPILEX BORDER 6X8 (Dressings/packing) IMPLANT
DRESSING PREVENA PLUS CUSTOM 90CM (Dressings/packing) ×3 IMPLANT
DRESSING TEGADERM 6" X 8" (Dressings/packing) IMPLANT
DRESSING TELFA 3" X 4" STRL (Dressings/packing) IMPLANT
ELECTRODE CLEANCOAT FLAT L-HOOK LAPAROSCOPIC 36CM (Cautery) ×3
ENDOSTITCH SUTURING 10MM (Lap/Endo/Arthroscopy) IMPLANT
FILM IOBAN 2 ANTIMICROBIAL 23" X 17" (Misc Surgical Supply) ×3 IMPLANT
GAUZE, SPONGE CURITY, 8 PLY, STERILE, 2"X2",2'S (Dressings/packing) IMPLANT
GLOVE BIOGEL INDICATOR UNDERGLOVE SIZE 8 (Gloves/Gowns) IMPLANT
GLOVE BIOGEL PI INDICATOR SIZE 7 (Gloves/Gowns) ×3 IMPLANT
GLOVE BIOGEL PI INDICATOR SIZE 7.5 (Gloves/Gowns) ×9 IMPLANT
GLOVE BIOGEL PI LF SIZE 8 INDICATOR (Gloves/Gowns) ×18 IMPLANT
GLOVE BIOGEL PI ULTRATOUCH SIZE 7.5 (Gloves/Gowns) ×27 IMPLANT
GLOVE BIOGEL PI ULTRATOUCH SIZE 8 (Gloves/Gowns) ×3 IMPLANT
GLOVE SURGEON BIOGEL SIZE 7 (Gloves/Gowns) ×12 IMPLANT
GLOVE SURGEON BIOGEL SIZE 7.5 (Gloves/Gowns) ×24
GLOVE SURGICAL BIOGEL SIZE 6.5 (Gloves/Gowns) ×15 IMPLANT
GLOVE SURGICAL BIOGEL SIZE 8 (Gloves/Gowns) ×3 IMPLANT
GOWN SIRUS XLG BLUE, AAMI LVL 4 (Gloves/Gowns) IMPLANT
GOWN SURGICAL AERO CHROME LARGE (Gloves/Gowns) IMPLANT
GOWN SURGICAL ULTRA XL BLUE, AAMI LVL 3 (Gloves/Gowns) ×12 IMPLANT
KIT DRAPE AND ICG SPY PHI (Procedure Packs/kits) ×3 IMPLANT
KIT M-CLOSE LAP PORT CLOSURE SYSTEM (Lap/Endo/Arthroscopy) IMPLANT
KIT NATURA ILEOSTOMY 70MM FLANGE (Misc Medical Supply) ×3 IMPLANT
LIGASURE IMPACT 36MM-18CM SEALER/DIVIDER, LARGE, CURVED - LF4418 (Lap/Endo/Arthroscopy)
LUBRICANT 2OZ STERILE PACKAGE (Misc Medical Supply) IMPLANT
MARKER SECURELINE SURG SKIN (Misc Medical Supply) ×3 IMPLANT
MASTISOL LIQUID ADHESIVE 2/3C (Misc Surgical Supply) ×12 IMPLANT
MESH ENFORM PREPERITONEAL 10CM X 10CM (Adhesion barrier/synthetic mesh/film - absorbable) ×3 IMPLANT
NEEDLE ENDOPATH ULTRA VERESS 120MM, STRAIGHT (Needles/punch/cannula/biopsy) ×3 IMPLANT
PAD ADHESIVE REMOVER (Patient Care Supply) IMPLANT
PAD GROUND VALLEYLAB REM ADULT E7507 (Cautery) ×9 IMPLANT
POLISHER TIP CAUTERY 5X5 (Cautery) ×3 IMPLANT
PREP TRAY WET SKIN SCRUB KENDALL (Prep Solutions) IMPLANT
PROBE HAND CONTROLLED PENCIL (Cautery) IMPLANT
PROBE RIGHT ANGLE ELECTRODE 5MM X 34CM (Cautery) IMPLANT
PROTECTOR ULNAR NERVE PAD, YELLOW (Patient Care Supply) ×3 IMPLANT
RELOAD ENDOPATH ECHELON 60MM, BLUE (Staplers and staple reloads) ×3 IMPLANT
RELOAD ENDOPATH ECHELON FLEX 60MM, GREEN (Staplers and staple reloads) ×6 IMPLANT
RETRACTOR ALEXIS WOUND PROTECTOR LARGE 9-14CM (Lap/Endo/Arthroscopy) ×3 IMPLANT
SEALANT FIBRIN VISTASEAL 10ML (Hemostatic agents/wax/sealants-absorbable)
SEALANT FIBRIN VISTASEAL 10ML (LOCATED IN FREEZER) (Hemostatic agents/wax/sealants-absorbable) IMPLANT
SET TUBING PNEUMOCLEAR SMOKE EVACUATION HIGH FLOW (Tubing/Suction) ×3 IMPLANT
SHEAR HARMONIC HD 1000I 36CM CURVED (Lap/Endo/Arthroscopy) IMPLANT
SKIN CLOSURE SYSTEM PRINEO 60CM (Misc Surgical Supply) IMPLANT
SKIN PREP -BATH CLOTH CHG 2% (Prep Solutions) IMPLANT
SLEEVE SCD KNEE MEDIUM (Patient Care Supply) ×3 IMPLANT
SOLUTION BETADINE 4 OZ (Prep Solutions) IMPLANT
SOLUTION IRR POUR BTL 0.9% NS 1000ML (Non-Pharmacy Meds/Solutions) ×3 IMPLANT
SOLUTION IRR POUR BTL H20 1000ML (Non-Pharmacy Meds/Solutions) ×6 IMPLANT
SOLUTION IV 0.9% NS 1000ML (Non-Pharmacy Meds/Solutions) IMPLANT
SPONGE ENDOZIME SLR (Misc Surgical Supply) IMPLANT
SPONGE ENZYMATIC SPONGE CA/80 (Misc Medical Supply) IMPLANT
SPONGE LAP RF DETECT 18" X 18" XRAY STERILE (Sponges) ×15 IMPLANT
STAPLE RELOAD PROXIMATE 75MM LINEAR CUTTER (Staplers and staple reloads) IMPLANT
STAPLER CELLULAR W/ TRI-STAPLE TECHNOLOGY (Staplers and staple reloads) ×3 IMPLANT
STAPLER CONTOUR CURVED CUTTER GREEN (Staplers and staple reloads) IMPLANT
STAPLER ECHELON FLEX 60 POWERED PCE60A (Staplers and staple reloads) ×3 IMPLANT
STAPLER INSORB SUBCUTICULAR W/ 30 ABSORBABLE STAPLES (Staplers and staple reloads) ×3 IMPLANT
STAPLER PROXIMATE SKIN 35 WIDE (Staplers and staple reloads) ×3 IMPLANT
STAPLER PURSTRING INSTRUMENT, 65MM, DISP (Disp Instruments) ×3 IMPLANT
STAPLER TLC75 LINEAR CUTTER 75 MM (Staplers and staple reloads)
STRIP MEDI-STRIP SKIN CLOSURE 1/2 X 4" (Dressings/packing) IMPLANT
STRIP SKIN CLOSURE 1/2 X 4 (Dressings/packing)
SUCTION IRRIGATOR STRYKEFLOW2- SINGLE USE (Lap/Endo/Arthroscopy) IMPLANT
SURGICAL PACK BASIC MAJOR SET-UP (Procedure Packs/kits) IMPLANT
SURGICAL PACK LAPAROSCOPIC (Procedure Packs/kits) ×3 IMPLANT
SURGILUBE JELLY 2OZ- STERILE (Misc Surgical Supply) ×6 IMPLANT
SUTURE ENDO STITCH 2-0 SOFSILK 48" (Suture) IMPLANT
SUTURE ENDOLOOP PDS II 0 18" (EZ10) (Suture) IMPLANT
SUTURE ETHILON 2-0 18" FS (Suture) ×6 IMPLANT
SUTURE ETHILON 2-0 18" PS-2 (Suture) IMPLANT
SUTURE MONOCRYL PLUS 4-0 27" PS-2 MCP426 (Suture) IMPLANT
SUTURE PDS PLUS 0 27" CT-1 VIOLET (Suture)
SUTURE PDS PLUS 0 27" CT-2 (PDP334) (Suture) ×6 IMPLANT
SUTURE PDS PLUS 1 36" CT-1 (Suture) ×36 IMPLANT
SUTURE PDS PLUS 2-0 27" CT-2 (PDP333) (Suture) IMPLANT
SUTURE PDS PLUS 2-0 27" SH (PDP317) (Suture) ×6 IMPLANT
SUTURE PROLENE 0 30" CT-1 (Suture)
SUTURE PROLENE 0 30" CT-1 8424 (Suture) IMPLANT
SUTURE PROLENE 2-0 18" FS (Suture) ×3 IMPLANT
SUTURE PROLENE 2-0 48" SH (8533) (Suture) IMPLANT
SUTURE STRATAFIX PDS PLUS 1 CT-1 45CM, 1/2 CIRCLE (Suture) ×3 IMPLANT
SUTURE V-LOC PBT RELOAD 0 6" N006L (Suture) IMPLANT
SUTURE VICRYL PLUS 2-0 27" SH VCP417 (Suture) ×6 IMPLANT
SUTURE VICRYL PLUS 3-0 18" SH POPS (Suture) ×9 IMPLANT
SUTURE VICRYL PLUS 3-0 27" SH VCP416 (Suture) ×6 IMPLANT
TOWEL RFID O.R. XRAY WHITE STERILE DISP (Drapes/towels) ×3 IMPLANT
TOWELS OR BLUE 4-PACK STERILE, DISPOSABLE (Drapes/towels) ×9 IMPLANT
TRAY FOLEY SURESTEP LUBRI-SIL I.C.16FR URIMETER, LF (Lines/Drains) ×3 IMPLANT
TROCAR ENDOPATH XCEL BLADELESS OPTIVIEW STABILITY SLEEVE 12MM X L100 MM (Lap/Endo/Arthroscopy) IMPLANT
TROCAR ENDOPATH XCEL BLADELESS OPTIVIEW STABILITY SLEEVE 5MM X L100 MM (Lap/Endo/Arthroscopy) IMPLANT
TROCAR ENDOPATH XCEL STABILITY SLEEVE 12MM X L100 MM UNIVERSAL (Lap/Endo/Arthroscopy) IMPLANT
TROCAR ENDOPATH XCEL STABILITY SLEEVE 5MM X L100 MM, UNIVERSAL (Lap/Endo/Arthroscopy) ×9 IMPLANT
TUBE YANKAUER SUCTION WITH BULBOUS TIP (Tubing/Suction) ×3 IMPLANT
TUBING IRRIGATION ENDOGATOR (Tubing/Suction) ×3 IMPLANT
TUBING SUCTION MEDI-VAC 9/32" X 20' (Tubing/Suction) ×9 IMPLANT
VALVE CONNECTOR DEFENDO, SINGLE USE (Misc Surgical Supply) ×3 IMPLANT

## 2022-03-25 NOTE — Anesthesia Procedure Notes (Signed)
Epidural/Spinal Block Procedure Note  Date/Time:  03/25/2022 6:39 AM   Universal Protocol:  Verbal consent obtained, written consent obtained, risks and benefits discussed, patient states understanding of the procedure being performed, the patient's understanding of the procedure matches consent given, procedure consent matches procedure scheduled, relevant documents present and verified, test results available and properly labeled, site marked, imaging studies available, required blood products, implants, devices, and special equipment available and Immediately prior to procedure a time out was called to verify the correct patient, procedure, equipment, support staff and site/side marked as required    Consent given by: patient  Patient identity confirmed by: verbally with patient, arm band, provided demographic data and hospital-assigned identification number   Procedure:    Procedure type: Spinal block  Sterile skin prep: Duraprep  Patient position: Sitting  Level: Lumbar  Approach: Midline  Interspace: L4-5          Needle gauge used to perform procedure: 25g ([place])  Local anesthetic used to anesthetize site: lidocaine 1% without epinephrine   Insertion attempts: 1  No Paresthesia Present      epidural CSF      Heme: No                 Comments:  IT morphine administered for postop pain control per surgeon request.    Single, atraumatic pass with good CSF return throughout injection. No paresthesias during placement. 262mcg of PF morphine injected.    Instructions for OR team: please minimize opioid administration after induction to prevent delayed emergence or apnea postoperatively. Consider titration of opioids after extubation if needed depending on level of pain.    Patient seen and examined in pre-op area, discussed risks, benefits, and alternatives of intrathecal morphine procedure including but not limited to headache, bleeding/infection, vascular injury, nerve damage, local anesthetic toxicity, and  injury to nearby structures.  More specifically, the risk of nerve injury/damage was discussed in detail as this is a may be associated with neuraxial blocks (as well as a complication of surgery itself i.e. surgical trauma, tourniquet use, positioning, postoperative movement/manipulation, etc.).  Patient elected to proceed with intrathecal morphine procedure after understanding and accepting risks. Answered all questions, informed consent obtained. Pre-procedure timeout was performed immediately prior to the procedure with RN.  Patient tolerated procedure well without complications.     IMorley Kos, MD, was present for the entire procedure.    Morley Kos, MD  03/25/2022 6:39 AM

## 2022-03-25 NOTE — H&P (Signed)
Surgery History and Physical      Patient Name: Tiffany Chen  MRN: [id number]  Room#: [facility] Day:   0 days - Admitted on: 03/25/2022    Admitted: 03/25/2022  4:58 AM    ID: [email or web address] Vansant is a 77 year old presents today for surgery.    Body mass index is 25.97 kg/m., with asymptomatic GERD with history of RUL MAC requiring lobectomy as well as a symptomatic parastomal hernia: Possible that MAC could be related to reflux, although definitely no guarantee that anti-reflux operation will "cure" her of MAC.  Plan to perform hiatal hernia repair and fundoplication at time of her open ventral hernia repair/ostomy takedown, consent signed previously.        Last took meds: yesterday AM, last took metformin Friday morning   Last PO intake: Ensures at 3:30 this am   Blood thinners: Denies  Allergies: denies         Patient Active Problem List    Diagnosis Date Noted    Pseudophakia of both eyes 01/14/2022    Posterior vitreous detachment 01/14/2022    Type 2 diabetes mellitus with other specified complication, unspecified whether long term insulin use (CMS-HCC) 12/18/2021    Status post cataract extraction and insertion of intraocular lens of left eye 11/23/2021    Status post cataract extraction and insertion of intraocular lens of right eye 11/09/2021    Ventral hernia without obstruction or gangrene 07/24/2021    Colostomy status (CMS-HCC) 07/24/2021    Status post Jeanette Caprice procedure (CMS-HCC) 06/26/2021    Colostomy in place (CMS-HCC) 06/26/2021    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    s/p Robotic right upper lobectomy with en bloc lower and middle lobe wedge resection, middle lobe wedge resection 03/20/2021     03/20/21  Robotic right upper lobectomy with en bloc lower and middle lobe wedge resection, middle lobe wedge resection ([facility])      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    Gastroesophageal reflux disease,  unspecified whether esophagitis present 10/12/2020     Added automatically from request for surgery [id number]      Hoarse voice quality 10/12/2020     Added automatically from request for surgery [id number]      Cavitary lesion of lung 07/24/2020    Acquired tracheobronchomegaly with bronchiectasis (CMS-HCC) 05/22/2020    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       Past Surgical History:  Procedure Laterality Date    BRONCHOSCOPY  12/2020    CESAREAN SECTION, LOW TRANSVERSE      COLOSTOMY      CRANIOTOMY      LUNG LOBECTOMY         Current Facility-Administered Medications   Medication Dose Route Frequency Provider Last Rate Last Admin    lactated ringers infusion   IntraVENOUS Continuous Diona Browner, MD   Stopped at 03/25/22 [phone]    lidocaine (XYLOCAINE) 1% PF injection 0.1 mL  0.1 mL IntraDERMAL Once PRN Broderick, Ethelle Lyon, MD           Allergies   Allergen Reactions    Walnuts Swelling     Tongue  swollen and burn       Family History   Problem Relation Name Age of Onset    Cancer Mother Prescilla Sours ngs and throat    Heart Disease Mother Eliane Decree         Congrestive heart failure due to cancer    Hypertension Mother Eliane Decree     Psychiatry Mother Eliane Decree         Bi polar, manic depressive    Cancer Father Warren Danes         Prostate    Diabetes Father Warren Danes     Stroke Father Warren Danes     Stroke M Grandmother [place]     Cancer Brother Dian Situ         Skin (many locations)    Diabetes Brother Dian Situ        Social History     Socioeconomic History    Marital status: Widowed     Spouse name: Not on file    Number of children: Not on file    Years of education: Not on file    Highest education level: Not on file   Occupational History    Not on file   Tobacco Use    Smoking status: Never    Smokeless tobacco: Never   Vaping Use    Vaping Use: Never used   Substance and Sexual Activity    Alcohol use: Yes     Comment: glass of wine every other month    Drug use: Not Currently    Sexual activity: Not Currently   Other Topics Concern    Military Service No    Blood Transfusions No    Caffeine Concern No    Occupational Exposure No    Hobby Hazards Yes     Comment: Gardening, MAC    Sleep Concern No    Stress Concern Yes     Comment: Husband death Cov19, 2020, sold home, rehomed pets. [place]    Weight Concern No    Special Diet Yes    Back Care No    Exercises Regularly Yes    Bike Helmet Use No    Seat Belt Use No    Performs Self-Exams No   Social History Narrative    Lives with brother Jeneen Rinks at home. Sister also lives in [place] Determinants of Health     Financial Resource Strain: Not on file   Food Insecurity: No Food Insecurity (12/17/2021)    Hunger Vital Sign     Worried About Running Out of Food in the Last Year: Never true     Ran Out of Food in the Last Year: Never  true   Transportation Needs: Not on file   Physical Activity: Sufficiently Active  (12/17/2021)    Exercise is Medicine (EIM)     Days of Exercise per Week: 5 days     Minutes of Exercise per Session: 60 min   Stress: Not on file   Social Connections: Not on file   Intimate Partner Violence: Low Risk  (03/20/2021)    [place] IPV     Have you ever been emotionally or physically abused by your partner or someone important to you?: No     Within the last year have you been hit, slapped, kicked or otherwise physically hurt by someone?: No     Since you've been pregnant, have you been slapped, kicked or otherwise physically hurt by someone?: N/A     Within the last year has anyone forced you to have sexual activities?: No     Are you afraid of your spouse or partner you listed above?: No   Housing Stability: Not on file             Activity/Exercise Tolerance:  Reports able to walk 2-3 miles, able to climb 2 flights of stairs.    Review Of Systems  As follows plus as noted in HPI/PMH    General:  Negative for recent or current fevers, chills or night sweats.  Skin: Negative for current rashes, sores or infection.  Eyes: Negative for visual changes, diplopia or blurry vision.   Ears/Nose/Throat/Mouth:   Negative for current sore throat or congestion.   Respiratory: Negative for current cough or sputum production.   Cardiovascular: Negative for chest pain or palpitations.   Gastrointestinal: Negative for nausea, vomiting, diarrhea, melena or hematochezia.    Genitourinary: Negative for dysuria, frequency, hesitancy or nocturia. Denies kidney disease.  Musculoskeletal: Denies significant joint pain.  Neurologic: Negative for history of seizures, numbness, tingling or weakness. Denies history of TIA or CVA  Psychiatric: negative  Hematologic/Lymphatic/Immunologic: Negative for anemia or bleeding problems or clotting disorders.  Negative for DVT or PE history.          Physical Exam:   Vitals: BP 129/60 (BP Location: Right arm, BP Patient Position: Sitting)   Pulse 74   Temp 97.7 F (36.5 C)   Resp 16   Ht  5\' 3"  (1.6 m)   Wt 67.9 kg (149 lb 12.8 oz)   SpO2 98%   BMI 26.54 kg/m       General Description: Alert and oriented. Affect normal. No sign of abuse.  Chest and Lungs: No deformity. Normal work of breathing.   Heart: Regular rate and rhythm. No murmur, rub or gallop.  Abdomen: BS+, soft, non-distended, non-tender.  Integument: No active lesion.  Neurologic: No focal deficit  Musculoskeletal: Moves all extremities without apparent deficit  Peripheral Vascular: extremities warm, well perfused    Recent Labs:  Lab Results   Component Value Date    NA 139 03/07/2022    K 4.1 03/07/2022    CL 104 03/07/2022    BICARB 25 03/07/2022    BUN 19 03/07/2022    CREAT 0.87 03/07/2022    GLU 195 (H) 03/07/2022    [place] 9.6 03/07/2022    AST 25 03/07/2022    ALT 24 03/07/2022    ALK 62 03/07/2022    TBILI 0.23 03/07/2022    ALB 4.1 03/07/2022    TP 6.9 03/07/2022       Lab Results   Component Value Date    WBC  5.7 03/07/2022    RBC 3.91 03/07/2022    HGB 12.4 03/07/2022    HCT 36.5 03/07/2022    MCV 93.4 03/07/2022    MCHC 34.0 03/07/2022    RDW 13.4 03/07/2022    PLT 212 03/07/2022    MPV 10.7 03/07/2022    LYMPHS 23 03/07/2022    MONOS 11 03/07/2022    EOS 4 03/07/2022    BASOS 1 03/07/2022       No results found for: "PT", "INR", "PTT"    Imaging: No results found.     Assessment  Preoperative history and physical completed for Consented Surgical Procedure.    PLAN  Proceed to OR for scheduled surgery    Code Status: Full Code/Full Care    Cletis Media, MD  Vascular Surgery PGY-1  [phone]

## 2022-03-25 NOTE — Plan of Care (Signed)
Problem: Promotion of Perioperative Health and Safety  Goal: Promotion of Health and Safety of the Perioperative Patient  Description: The patient remains safe, receives treatment appropriate to the surgical intervention and patient's physiological needs and is discharged or transferred to the appropriate level of care.    Information below is the current care plan.  Outcome: Progressing  Flowsheets (Taken 03/25/2022 1543)  Individualized Interventions/Recommendations #1: Maintain airway and oxygenation  Individualized Interventions/Recommendations #2 (if applicable): Recovery from anesthesia  Individualized Interventions/Recommendations #3 (if applicable): Stabilize vitals  Individualized Interventions/Recommendations #4 (if applicable): Hemodynamic stability monitoring  Individualized Interventions/Recommendations #5 (if applicable): Pain control  Outcome Evaluation (rationale for progressing/not progessing) every shift:   Progressing   Pt meets PACU criteria to transfer to inpatient bed

## 2022-03-25 NOTE — Anesthesia Postprocedure Evaluation (Signed)
Anesthesia Post Note    Patient: Tiffany Chen    Procedure(s) Performed: Procedure(s):  Open ventral hernia repair with component separation, insertion of mesh  Open colostomy takedown with low anterior resection and colorectal anastomosis, open splenic flexure mobilization, creation of diverting loop ileostomy, flexible sigmoidoscopy, intraoperative bowel perfusion assessment      Final anesthesia type: General    Patient location: PACU    Post anesthesia pain: adequate analgesia    Mental status: awake, alert , and oriented    Airway Patent: Yes    Last Vitals:   Vitals Value Taken Time   BP 120/70 03/25/22 1410   Temp 36.2 C 03/25/22 1400   Pulse 92 03/25/22 1413   Resp 20 03/25/22 1413   SpO2 99 % 03/25/22 1413   Vitals shown include unvalidated device data.     Post vital signs: stable    Hydration: adequate    N/V:no    Anesthetic complications: no    Plan of care per primary team.

## 2022-03-25 NOTE — Brief Op Note (Signed)
BRIEF OPERATIVE NOTE    DATE: 03/25/2022  TIME: 2:16 PM    PREOPERATIVE DIAGNOSIS: Presence of colostomy since 2009     POSTOPERATIVE DIAGNOSIS: Same     PROCEDURE INFORMATION:  Procedure(s):  Open ventral hernia repair with component separation, insertion of mesh - Wound Class: Class III (Contaminated) - Incision Closure: Deep and Superficial Layers  Open colostomy takedown with low anterior resection and colorectal anastomosis, open splenic flexure mobilization, creation of diverting loop ileostomy, flexible sigmoidoscopy, intraoperative bowel perfusion assessment - Wound Class: Class III (Contaminated) - Incision Closure: Deep and Superficial Layers    ATTENDING SURGEON:   Surgeon(s) and Role:  Panel 1:     * Broderick, Ethelle Lyon, MD - Primary     * Charna Busman, MD - Fellow  Panel 2:     * Astrid Divine, MD - Primary     * Heloise Beecham, MD    ANESTHESIA: GETA    FINDINGS:   Colostomy takedown   Extensive LOA  Difficult pelvic dissection to release rectum  Stapled colorectal anastomosis   Ileostomy creation   Ventral hernia repai  Component separation  JP drain placement x2    SPECIMENS:   ID Type Source Tests Collected by Time Destination   A : RECTUM Tissue Rectum PATHOLOGY TISSUE Bradd Burner, MD 03/25/2022 1044    B : Colon resection with colostomy Tissue Colon PATHOLOGY TISSUE Bradd Burner, MD 03/25/2022 1115    C : OMENTUM Tissue Omentum PATHOLOGY TISSUE EXAM Diona Browner, MD 03/25/2022 1237          Fluids/Blood Products:      IV Fluids: 2.7L LR     Blood Products: None    EBL: 200 mL     Urine Output: 2L    COMPLICATIONS: None    DISPOSITION: IMU    Eloisa Northern, MD  General Surgery PGY-2

## 2022-03-26 ENCOUNTER — Inpatient Hospital Stay (HOSPITAL_COMMUNITY): Payer: Medicare Other

## 2022-03-26 ENCOUNTER — Other Ambulatory Visit (HOSPITAL_BASED_OUTPATIENT_CLINIC_OR_DEPARTMENT_OTHER): Payer: Self-pay | Admitting: Pharmacist

## 2022-03-26 DIAGNOSIS — R52 Pain, unspecified: Secondary | ICD-10-CM

## 2022-03-26 DIAGNOSIS — K439 Ventral hernia without obstruction or gangrene: Secondary | ICD-10-CM

## 2022-03-26 DIAGNOSIS — Z4659 Encounter for fitting and adjustment of other gastrointestinal appliance and device: Secondary | ICD-10-CM

## 2022-03-26 LAB — BASIC METABOLIC PANEL, BLOOD
Anion Gap: 13 mmol/L (ref 7–15)
Anion Gap: 8 mmol/L (ref 7–15)
BUN: 16 mg/dL (ref 8–23)
BUN: 13 mg/dL (ref 8–23)
Bicarbonate: 18 mmol/L — ABNORMAL LOW (ref 22–29)
Bicarbonate: 22 mmol/L (ref 22–29)
Calcium: 8.5 mg/dL (ref 8.5–10.6)
Calcium: 8.4 mg/dL — ABNORMAL LOW (ref 8.5–10.6)
Chloride: 106 mmol/L (ref 98–107)
Chloride: 106 mmol/L (ref 98–107)
Creatinine: 0.99 mg/dL — ABNORMAL HIGH (ref 0.51–0.95)
Creatinine: 0.85 mg/dL (ref 0.51–0.95)
Glucose: 236 mg/dL — ABNORMAL HIGH (ref 70–99)
Glucose: 166 mg/dL — ABNORMAL HIGH (ref 70–99)
Potassium: 5.7 mmol/L — ABNORMAL HIGH (ref 3.5–5.1)
Potassium: 4.8 mmol/L (ref 3.5–5.1)
Sodium: 137 mmol/L (ref 136–145)
Sodium: 136 mmol/L (ref 136–145)
eGFR Based on CKD-EPI 2021 Equation: 59 mL/min/{1.73_m2}
eGFR Based on CKD-EPI 2021 Equation: >60 mL/min/{1.73_m2}

## 2022-03-26 LAB — HEMOGRAM, BLOOD
Hct: 39.4 % (ref 34.0–45.0)
Hgb: 13 gm/dL (ref 11.2–15.7)
MCH: 31.3 pg (ref 26.0–32.0)
MCHC: 33 g/dL (ref 32.0–36.0)
MCV: 94.7 um3 (ref 79.0–95.0)
MPV: 10.3 fL (ref 9.4–12.4)
Plt Count: 246 10*3/uL (ref 140–370)
RBC: 4.16 10*6/uL (ref 3.90–5.20)
RDW: 12.9 % (ref 12.0–14.0)
WBC: 11.1 10*3/uL — ABNORMAL HIGH (ref 4.0–10.0)

## 2022-03-26 MED ORDER — CALCIUM GLUCONATE-NACL 1-0.675 GM/50ML-% IV SOLN
1.0000 g | Freq: Once | INTRAVENOUS | Status: AC
Start: 2022-03-26 — End: 2022-03-26
  Administered 2022-03-26: 1 g via INTRAVENOUS
  Filled 2022-03-26: qty 50

## 2022-03-26 MED ORDER — LORATADINE 10 MG OR TABS
10.0000 mg | ORAL_TABLET | Freq: Every day | ORAL | Status: DC
Start: 2022-03-26 — End: 2022-04-03
  Administered 2022-03-29 – 2022-04-03 (×6): 10 mg via ORAL
  Filled 2022-03-26 (×7): qty 1

## 2022-03-26 MED ORDER — SODIUM CHLORIDE 0.9 % IV BOLUS
1000.0000 mL | INJECTION | Freq: Once | INTRAVENOUS | Status: AC
Start: 2022-03-26 — End: 2022-03-26
  Administered 2022-03-26: 1000 mL via INTRAVENOUS

## 2022-03-26 MED ORDER — PANTOPRAZOLE SODIUM 40 MG IV SOLR
40.0000 mg | Freq: Every day | INTRAVENOUS | Status: DC
Start: 2022-03-26 — End: 2022-04-01
  Administered 2022-03-26 – 2022-03-31 (×6): 40 mg via INTRAVENOUS
  Filled 2022-03-26 (×6): qty 40

## 2022-03-26 MED ORDER — ATORVASTATIN CALCIUM 10 MG OR TABS
10.0000 mg | ORAL_TABLET | Freq: Every day | ORAL | Status: DC
Start: 2022-03-26 — End: 2022-04-03
  Administered 2022-03-29 – 2022-04-03 (×6): 10 mg via ORAL
  Filled 2022-03-26 (×7): qty 1

## 2022-03-26 MED ORDER — SALMETEROL XINAFOATE 50 MCG/ACT IN AEPB
1.0000 | INHALATION_SPRAY | Freq: Two times a day (BID) | RESPIRATORY_TRACT | Status: DC
Start: 2022-03-26 — End: 2022-04-03
  Administered 2022-03-26 – 2022-04-03 (×16): 1 via RESPIRATORY_TRACT
  Filled 2022-03-26 (×2): qty 60

## 2022-03-26 MED ORDER — ACETAMINOPHEN 10 MG/ML IV SOLN
1000.0000 mg | Freq: Three times a day (TID) | INTRAVENOUS | Status: AC
Start: 2022-03-26 — End: 2022-03-27
  Administered 2022-03-26 – 2022-03-27 (×3): 1000 mg via INTRAVENOUS
  Filled 2022-03-26 (×3): qty 100

## 2022-03-26 MED ORDER — ENOXAPARIN SODIUM 40 MG/0.4ML IJ SOSY
40.0000 mg | PREFILLED_SYRINGE | Freq: Every day | INTRAMUSCULAR | Status: DC
Start: 2022-03-26 — End: 2022-04-03
  Administered 2022-03-26 – 2022-04-01 (×6): 40 mg via SUBCUTANEOUS
  Filled 2022-03-26 (×7): qty 1

## 2022-03-26 MED ORDER — LACTATED RINGERS IV SOLN
INTRAVENOUS | Status: DC
Start: 2022-03-26 — End: 2022-03-27

## 2022-03-26 MED ORDER — AZITHROMYCIN 250 MG OR TABS
250.0000 mg | ORAL_TABLET | Freq: Every day | ORAL | Status: DC
Start: 2022-03-26 — End: 2022-04-03
  Administered 2022-03-29 – 2022-04-03 (×6): 250 mg via ORAL
  Filled 2022-03-26 (×7): qty 1

## 2022-03-26 MED ORDER — RIFAMPIN 300 MG OR CAPS
600.0000 mg | ORAL_CAPSULE | Freq: Every day | ORAL | Status: DC
Start: 2022-03-26 — End: 2022-04-03
  Administered 2022-03-29 – 2022-04-03 (×6): 600 mg via ORAL
  Filled 2022-03-26 (×8): qty 2

## 2022-03-26 MED ORDER — SODIUM CHLORIDE (INHALANT) 7 % IN NEBU
4.0000 mL | INHALATION_SOLUTION | Freq: Two times a day (BID) | RESPIRATORY_TRACT | Status: DC
Start: 2022-03-26 — End: 2022-03-26

## 2022-03-26 MED ORDER — LANSOPRAZOLE 30 MG OR CPDR
30.0000 mg | DELAYED_RELEASE_CAPSULE | Freq: Every day | ORAL | Status: DC
Start: 2022-03-27 — End: 2022-03-26

## 2022-03-26 MED ORDER — FLUOXETINE HCL 20 MG OR CAPS
40.0000 mg | ORAL_CAPSULE | Freq: Every day | ORAL | Status: DC
Start: 2022-03-26 — End: 2022-04-03
  Administered 2022-03-29 – 2022-04-03 (×6): 40 mg via ORAL
  Filled 2022-03-26 (×7): qty 2

## 2022-03-26 MED ORDER — CHOLECALCIFEROL 125 MCG (5000 UT) PO TABS
5000.0000 [IU] | ORAL_TABLET | Freq: Every day | ORAL | Status: DC
Start: 2022-03-26 — End: 2022-04-03
  Administered 2022-03-29 – 2022-04-03 (×6): 5000 [IU] via ORAL
  Filled 2022-03-26 (×7): qty 1

## 2022-03-26 MED ORDER — SODIUM CHLORIDE (INHALANT) 7 % IN NEBU
4.0000 mL | INHALATION_SOLUTION | Freq: Two times a day (BID) | RESPIRATORY_TRACT | Status: DC | PRN
Start: 2022-03-26 — End: 2022-04-03

## 2022-03-26 MED ORDER — ACETAMINOPHEN 325 MG PO TABS
975.0000 mg | ORAL_TABLET | Freq: Three times a day (TID) | ORAL | Status: DC
Start: 2022-03-26 — End: 2022-03-26
  Filled 2022-03-26: qty 3

## 2022-03-26 MED ORDER — MENTHOL 3 MG MT LOZG
1.0000 | LOZENGE | OROMUCOSAL | Status: DC | PRN
Start: 2022-03-26 — End: 2022-04-03
  Administered 2022-03-26 – 2022-03-28 (×5): 1 via BUCCAL
  Filled 2022-03-26 (×6): qty 1

## 2022-03-26 MED ORDER — AMIKACIN SULFATE LIPOSOME 590 MG/8.4ML IN SUSP
590.0000 mg | RESPIRATORY_TRACT | Status: DC
Start: 2022-03-26 — End: 2022-03-30
  Administered 2022-03-27 – 2022-03-29 (×3): 590 mg via RESPIRATORY_TRACT

## 2022-03-26 MED ORDER — ALBUTEROL SULFATE 108 (90 BASE) MCG/ACT IN AERS
2.0000 | INHALATION_SPRAY | Freq: Four times a day (QID) | RESPIRATORY_TRACT | Status: DC | PRN
Start: 2022-03-26 — End: 2022-04-03
  Administered 2022-03-30 – 2022-03-31 (×3): 2 via RESPIRATORY_TRACT
  Filled 2022-03-26: qty 6.7

## 2022-03-26 MED ORDER — HELP
590.0000 mg | Freq: Every day | Status: DC
Start: 2022-03-26 — End: 2022-03-26

## 2022-03-26 MED ORDER — FERROUS SULFATE 324 (65 FE) MG PO TBEC
324.0000 mg | DELAYED_RELEASE_TABLET | Freq: Every day | ORAL | Status: DC
Start: 2022-03-26 — End: 2022-04-03
  Administered 2022-03-29 – 2022-04-03 (×6): 324 mg via ORAL
  Filled 2022-03-26 (×7): qty 1

## 2022-03-26 MED ORDER — ETHAMBUTOL HCL 400 MG OR TABS
800.0000 mg | ORAL_TABLET | Freq: Every day | ORAL | Status: DC
Start: 2022-03-26 — End: 2022-04-03
  Administered 2022-03-29 – 2022-04-03 (×6): 800 mg via ORAL
  Filled 2022-03-26 (×7): qty 2

## 2022-03-26 MED ORDER — CLOFAZIMINE IRB 21-0063 50 MG CAPSULE
100.0000 mg | Freq: Two times a day (BID) | Status: DC
Start: 2022-03-26 — End: 2022-03-27

## 2022-03-26 MED ORDER — PHENOL 1.4 % MT LIQD
1.0000 | OROMUCOSAL | Status: DC | PRN
Start: 2022-03-26 — End: 2022-04-03

## 2022-03-26 NOTE — Interdisciplinary (Signed)
03/26/22 1452   Initial Assessment   CM Initial Assessment * Completed   Readmission Risk Assessment   Readmission Within 30 Days of Discharge * No   Do You Have Transportation Issues/Concerns That Make It Difficult To Get To Your Appointments?  No   Recent Hospitalizations (Within Last 6 Months) * No   High Risk For Readmission * No   Patient Information   Where was the patient admitted from? * Home   Address on Facesheet correct?* Yes   Patient contact phone number on Facesheet correct? Yes   PCP listed on Facesheet correct? Yes   Prior to Level of Function * Ambulatory;Independent with ADL's   Assistive Device * Walker  (Pt states she is independent at baseline, but has her late husband's FWW at home should she need it)   [place] * No   Available Assistance/Support System  and Prior Community Resources Family member(s)   Primary Caretaker(s) * Self   Primary Family/Caregiver Contact Name, Number and Relationship * Virl Cagey (sister) [phone]   Permission to Contact * Yes   Social Worker Consult   Do you need to see a Education officer, museum? * No   Income Information   Income Source Horticulturist, commercial Medicare   Do you have difficulty affording your medications No   Discharge Planning   Living Arrangements on Admission* Family Member  (Lives in a "mother-in-law suite" apartment in her sister's home)   Type of Residence * Apartment   Stairs/Steps to home  Yes   Number of Stairs/Steps 5+ STE   Patient's Discharge Goal(s) Home   Anticipated Discharge Disposition/Needs Home with Family;Too soon to be determined;HH RN;Ostomy supplies   Anticipated DischargeTransportation *  Family/Friend   Anticipated Discharge Transportation Details Sister Virl Cagey; [phone])   Barriers to Discharge * Clinical reason;Pending PT/OT recommendations/clearance  (+NGT; +2LNC; +JP x2; +wound vac; +PCA)   Patient Engaged in Discharge Planning * Yes   PATIENT CHOICE: Patient/Family/Legal/Surrogate Decision  Maker Has Been Given a List Options And Choice In The Selection of Post-Acute Care Providers Yes   CM discussed the following with pt, and/or family, and/or DPOA [place] has agreements with select post-acute care providers in the collaborative care network;If patient has chosen [facility] or [facility] for post-acute care, they were informed that we partner with, and have a financial interest in these organizations   Family/Caregiver's Assessed for * Not Applicable   Respite Care * Not Applicable   Patient/Family/Other Are In Agreement With Discharge Plan * To be determined   Vaccinated for COVID - 19 Yes   Public Health Clearance Needed * Not Applicable     Reason for Admission:  83F PMH asymptomatic GERD and history of RUL MAC requiring lobectomy as well as symtpomatic parastomal hernia. S/p repair of recurrent incisional/ventral hernia, repair of parastomal hernia with mobilization of the left rectus muscle w/ myofascial flap component separation w/ mesh, as well as ostomy takedown w/ creation of diverting loop illeostomy 3/18     Medical Necessity:  +PCA  +NGT  +JP drain x2  +foley  +abdominal wound vac  +2LNC supplemental O2  +LR @ 42mL/hr continuous    Interpreter Used: N/a    LACE+ Score: 50      PCP verified:  Leo Grosser [facility]  [address] / [place] [address]  telephone [phone]  fax [phone]    Pharmacy:  [facility],  [place] [address]    PLOF: Independent    SNF Hx & preference:  None  HH Hx & preference:  States she has had it in the past, but doesn't remember the name of the company  DME Hx & preference: Uses none at baseline. Has her late husband's FWW at home as well as several walking sticks. Ostomy supplies- also doesn't remember name of company- states she will call her sister and have her check.     DISCHARGE PLANNING    Social Support:   Pt states she lives alone in a "grandmother" apartment in  her sister's home. Independent at baseline with no DME use. Able to walk 2-3 miles prior to admit.     Anticipated DC disposition:   Home with family    Anticipated DC needs:   TBD- pending clinical course and PT/OT recs  Ostomy supplies    Anticipated barriers to discharge:   None    Anticipated Transportation @ discharge:   Pt states her sister will take her home            Covid Vax status:   Vaccinated + 3 boosters    Expected discharge date:   3-4 days       Mathew Storck Cornelia Copa, BS RN   RN Case Freight forwarder

## 2022-03-26 NOTE — Progress Notes (Signed)
Encounter opened to query outside prescription records.

## 2022-03-26 NOTE — Consults (Signed)
[facility] Consult Note  Intrathecal Duramorph  Between 7-5pm, please page on-call Regional fellow/resident [phone] for questions about pain management, epidural, ketamine/lidocaine, intrathecal morphine, or nerve catheter issues.  After 5pm, please page on-call Regional fellow/resident [phone] for issues with epidurals, infusions, intrathecal morphine, or nerve catheters.  Please direct pain medication management questions to primary team.    Referring MD: Diona Browner, *  Referring Department: MIS  Acute Pain Service Attending: Kerianne Gurr    History of Present Illness:     Tiffany Chen is a 77 year old female with a PMH significant for RUL MAC, parastomal hernia, GERD who was admitted on 03/25/2022 . The patient had a lumbar single injection intrathecal duramorph placed at level L4 for postoperative pain.     The primary service has requested that we evaluate and manage the patient's postoperative pain.    Preoperative and postoperative medications have been ordered following extensive review of chart and are noted to be appropriate for patient based on medical history, allergies, age and current medication regimen. The following preoperative medications were ordered: APAP.    CURES: reviewed    Interval History:  The patient is POD 1, s/p open VHR, colostomy takedown given 248mcg preoperative intrathecal duramorph    The patient currently reports pain is located in the abdomen with a pain score of 2/10 and describes the pain as aching.    Overnight events: reviewed    Apnea events overnight: none      Diet: Diet NPO; No; Patient intolerant of oral intake     Ambulatory Status: has not gotten out of bed    DVT prophylaxis/anti-coagulants: SCDs    Pre-admission pain meds:  none    Home Medications:  Facility-Administered Medications Prior to Admission   Medication Dose Route Frequency Provider Last Rate Last Admin    lidocaine (XYLOCAINE) 2 % viscous solution 1 mL  1 mL Other PRN  Broderick, Ethelle Lyon, MD        lidocaine (XYLOCAINE) 4 % topical   Other PRN Diona Browner, MD         Medications Prior to Admission   Medication Sig Dispense Refill Last Dose    ARIKAYCE 590 MG/8.4ML SUSP Inhale the contents of one vial (590mg ) via Lamira device 1 time daily as directed. 28 each 5 03/24/2022    atorvastatin (LIPITOR) 10 MG tablet Take 1 tablet (10 mg) by mouth daily. 90 tablet 2 Past Week    azithromycin (ZITHROMAX) 250 MG tablet TAKE 1 TABLET BY MOUTH  DAILY 60 tablet 11 Past Week    CLOFAZIMINE IRB 21-0063 50 MG CAPSULE Take 2 capsules (100mg ) by mouth once daily with food as directed. 200 capsule 0 Past Week    CLOFAZIMINE IRB 21-0063 50 MG CAPSULE Take 2 capsules (100mg ) by mouth once daily with food as directed. 200 capsule 0 Past Week    CLOFAZIMINE IRB 21-0063 50 MG CAPSULE Take 2 capsules (100mg ) by mouth once daily with food as directed. 200 capsule 0 Past Week    ethambutol (MYAMBUTOL) 400 MG tablet TAKE 2 TABLETS BY MOUTH  DAILY 120 tablet 11 Past Week    ferrous sulfate 325 (65 Fe) MG tablet Take 1 tablet (325 mg) by mouth 3 times daily.   Past Week    fluoxetine (PROZAC) 40 MG capsule Take 1 capsule (40 mg) by mouth daily. 90 capsule 3 Past Week    loratadine (CLARITIN) 10 MG tablet 1 tablet   Past Week  metFORMIN (GLUCOPHAGE) 500 mg tablet Take 1 tablet (500 mg) by mouth 2 times daily (with meals). 180 tablet 1 Past Week    omeprazole (PRILOSEC) 20 MG capsule TAKE 1 CAPSULE BY MOUTH IN  THE MORNING BEFORE  BREAKFAST 90 capsule 3 Past Week    Pediatric Multivitamins-Fl (MULTIVITAMINS/FL PO)    Past Week    ramipril (ALTACE) 2.5 MG capsule Take 1 capsule (2.5 mg) by mouth daily. 90 capsule 1 Past Week    rifAMPin (RIFADIN) 300 MG capsule TAKE 2 CAPSULES BY MOUTH  DAILY 120 capsule 11 Past Week    sodium chloride 7 % NEBU USE 1 VIAL VIA NEBULIZER  EVERY 12 HOURS 720 mL 3 Unknown    umeclidinium-vilanterol (ANORO ELLIPTA) 62.5-25 MCG/ACT inhaler Inhale 1 puff by mouth daily.  60 each 11 Past Week    VENTOLIN HFA 108 (90 Base) MCG/ACT inhaler USE 2 INHALATIONS BY MOUTH  EVERY 6 HOURS AS NEEDED FOR WHEEZING 72 g 3 Past Week    vitamin D3 125 MCG (5000 UT) capsule Take 1 capsule (5,000 Units) by mouth every 24 hours.   Past Week       Medications:  Scheduled Meds   acetaminophen  975 mg Q8H    atorvastatin  10 mg Daily    azithromycin  250 mg Daily    calcium gluconate  1 g Once    cholecalciferol  5,000 Units Daily    CLOFAZIMINE IRB 21-0063 50 MG CAPSULE 100 mg  100 mg BID    ethambutol  800 mg Daily    ferrous sulfate  324 mg Daily    fluoxetine  40 mg Daily    [START ON 03/27/2022] lansoprazole  30 mg QAM AC    lidocaine  2 patch Q24H    loratadine  10 mg Daily    rifAMPin  600 mg Daily    salmeterol  1 puff Q12H    sodium chloride  4 mL Q12H     PRN Meds   albuterol  2 puff Q6H PRN    diphenhydrAMINE  25 mg Q4H PRN    HYDROmorphone  0.4 mg Q4H PRN    menthol  1 lozenge Q2H PRN    nalOXone  0.1 mg Q2 Min PRN    phenol  1 spray Q2H PRN     IV Meds   dextrose 5%-sodium chloride 0.45%-potassium chloride 20 mEq/L 75 mL/hr at 03/26/22 0636    HYDROmorphone      lactated ringers Stopped (03/25/22 [id number])       Oral/Transdermal Opioids Administered: none  IV Opioids Administered: none  PCA Usage: dPCA 0.2/10 - 1.4mg  overnight  Epidural PCA Usage:N/A  Other Analgesic/Anxiolytics: IV APAP, lidoderm    Allergies   Allergen Reactions    Walnuts Swelling     Tongue swollen and burn     Past Medical History:   Diagnosis Date    Acquired tracheobronchomegaly with bronchiectasis (CMS-HCC) 05/22/2020    Age-related nuclear cataract of left eye 10/15/2021    Cavitary lesion of lung 07/24/2020    Combined forms of age-related cataract of both eyes 10/15/2021    Depression     DM (diabetes mellitus) (CMS-HCC)     Gastroesophageal reflux disease, unspecified whether esophagitis present 10/12/2020    Added automatically from request for surgery [id number]    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)     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     Past Surgical History:   Procedure Laterality Date    BRONCHOSCOPY  12/2020    CESAREAN SECTION, LOW TRANSVERSE      COLOSTOMY      CRANIOTOMY      LUNG LOBECTOMY       Social History     Socioeconomic History    Marital status: Widowed   Tobacco Use    Smoking status: Never    Smokeless tobacco: Never   Vaping Use    Vaping Use: Never used   Substance and Sexual Activity    Alcohol use: Yes     Comment: glass of wine every other month    Drug use: Not Currently    Sexual activity: Not Currently   Social Activities of Daily Living Present    Military Service No    Blood Transfusions No    Caffeine Concern No    Occupational Exposure No    Hobby Hazards Yes     Comment: Gardening, MAC    Sleep Concern No    Stress Concern Yes     Comment: Husband death Cov19, 2020, sold home, rehomed pets. [place]    Weight Concern No    Special Diet Yes    Back Care No    Exercises Regularly Yes    Bike Helmet Use No    Seat Belt Use No    Performs Self-Exams No   Social History Narrative    Lives with brother Jeneen Rinks at home. Sister also lives in [place] History   Problem Relation Name Age of Onset    Cancer Mother Prescilla Sours ngs and throat    Heart Disease Mother Eliane Decree         Congrestive heart failure due to cancer    Hypertension Mother Eliane Decree     Psychiatry Mother Eliane Decree         Bi polar, manic depressive    Cancer Father Warren Danes         Prostate    Diabetes Father Warren Danes     Stroke Father Warren Danes     Stroke M Grandmother  [place]     Cancer Brother Dian Situ         Skin (many locations)    Diabetes Brother Dian Situ          Review of Systems:  General: Negative  Psychiatry: Negative  HEENT: Negative  Gastrointestinal: Negative  Respiratory: Negative  GU: Urinary rentention no  Neuro: denies numbness, denies weakness; Perioral numbness, Dizziness, or Seizures: no  Musculoskeletal: Muscle pain  Remainder of a 12 point review of systems is negative      Labs:  Recent Labs     03/25/22  1448 03/26/22  0627   WBC 14.9* 11.1*   HGB 13.1 13.0   HCT 39.6 39.4   PLT 259 246     Recent Labs     03/25/22  1448 03/26/22  0627   NA 138 137   K 4.8 5.7*   CL 107 106   BICARB 22 18*   BUN  13 16   CREAT 0.77 0.99*   GLU 191* 236*   [place] 8.3* 8.5   MG 1.7  --    PHOS 4.5  --      No results found for: "INR", "PTT"        Imaging:  No results found for this or any previous visit.      Labs and Imaging studies reviewed.     Physical Exam:  Vitals: BP 140/67   Pulse 94   Temp 36.9 C   Resp 24   Ht 5\' 3"  (1.6 m)   Wt 67.9 kg (149 lb 12.8 oz)   SpO2 96%   BMI 26.54 kg/m   General: alert  Mental Status:  awake, oriented x 3. Speech is clear/ normal  Affect:  euthymic  Respiratory: Breathing comfortably, speaking in full sentences.  Head/Pupils: Unremarkable  Neuro: Sensory block: absent, Motor block: absent   Injection Site: Appears Clean, dry, and intact  Skin: intact, no lesions  Incision: Clean, dry, and intact  Abdominal: Appropriately tender to palpation      Assessment/Plan:  This is a 77 year old female POD 1 Day Post-Op  with Intrathecal morphine and Intravenous controlled patient analgesia for post-operative/acute pain control. Currently with excellent pain control. Given the patient's comorbidities and recent major surgery complicated by Inability to tolerate oral intake and Inability to ambulate post-operative/acute pain should continue to be treated with Intrathecal morphine and Intravenous controlled patient analgesia. Past  medical history, labs and medications have been reviewed with patient. Relevant daily labs and coagulation status have been reviewed for increased risk of epidural hematoma and adverse medication reactions. Patients hemodynamic status has been continuously evaluated throughout the last 24 hours and has not had episodes of hypotension.    Past medical history, labs and medications have been reviewed with patient. Relevant daily labs and coagulation status have been reviewed for increased risk of epidural hematoma and adverse medication reactions.    Pain/Neuro: Evaluation of neurologic exam showed no evidence of epidural hematoma.  - Neuraxial morphine not yet resolved, pain may increase today as it does.  - Continue IV dilaudid PCA PRN breakthrough pain until diet advanced/NGT removed.  - Continue IV Tylenol, lidoderm patches.  - Encourage OOB to chair QID, ambulation.  CV: hemodynamically stable  Resp: Patients respiratory status has been continuously evaluated by RN throughout the last 24 hours and has not had episodes of respiratory depression or apnea.   - Stable and is not showing signs of over sedation.   -  With PCA continue monitoring for respiratory depression with continuous end tidal CO2 monitoring  FEN/GI:  Diet NPO; No; Patient intolerant of oral intake   Renal: Patient is not having urinary retention   - Defer to primary team for foley management.   Heme/ID: afebrile, WBC wnl, platelets stable  PPx: SCDs per primary team.       Patient counseled on the above recommendations. Plan discussed and coordinated with primary service.    Between 7-5pm, please page on-call Regional fellow/resident [phone] for questions about pain management, epidural, ketamine/lidocaine, intrathecal morphine, or nerve catheter issues.  After 5pm, please page on-call Regional fellow/resident [phone] for issues with epidurals, infusions, intrathecal morphine, or nerve catheters.  Please direct pain medication management questions to  primary team.    Note Author: Morley Kos, MD    Medical Decision Making  Today Reviewed CURES, Reviewed note(s) from surgical team, Reviewed note(s) from nursing, Reviewed labs, Reviewed medications and  allergies, Reviewed medication administration report, Reviewed last 24 hours of vital signs and intake and output, Discussed/messaged regarding case with surgical team, and Discussed/messaged regarding case with nurse. Reviewed note(s) from preop clinic, Reviewed note(s) from MIS, Reviewed operative report, Reviewed anesthesia record, History obtained from patient and friend/family caregiver, and Discussed/messaged regarding case with provider MIS    Risks applicable to today's encounter and procedural risks relevant to the patient include:   Intrathecal drug management with monitoring for toxicity including oxymetry, sedation scales and end-tidal CO2, Intravenous controlled substances management with monitoring for toxicity including oxymetry, sedation scales and end-tidal CO2, Cardiovascular Risks, Pulmonary Risks, Neurological Risks, Risk of severe or worsening of pain in the setting of very recent major surgery or ongoing/evolving medical pathology, and Risk of severe or worsening of pain in the setting of other medical, psychological, social or economic risk factors

## 2022-03-26 NOTE — Progress Notes (Signed)
MIS Surgery Progress Note    Patient Name: Tiffany Chen MRN: [id number]    Hospital Day:   1 day - Admitted on: 03/25/2022  Post Operative Day(s): Day of surgery 3/18    HPI: 89F with asymptomatic GERD and history of RUL MAC requiring lobectomy as well as symtpomatic parastomal hernia. S/p repair of recurrent incisional/ventral hernia, repair of parastomal hernia with mobilization of the left rectus muscle w/ myofascial flap component separation w/ mesh, as well as ostomy takedown w/ creation of diverting loop ostomy w/ Dr. Hillery Hunter and Dr. Astrid Divine 3/18    Events/Subjective:  NAEON, VSS, states her throat hurts     Vitals signs:  Latest entry:   Temperature: 98.4 F (36.9 C)  Heart Rate: 92  Respirations: 22  Blood pressure (BP): 137/68  SpO2: 96 %  Weight: 67.9 kg (149 lb 12.8 oz)  Percentage Weight Change (%): 7.86 %    Temperature:  [97 F (36.1 C)-98.7 F (37.1 C)] 98.4 F (36.9 C) (03/19 1130)  Blood pressure (BP): (125-140)/(58-72) 137/68 (03/19 1130)  Heart Rate:  [86-98] 92 (03/19 1130)  Respirations:  [18-27] 22 (03/19 1000)  Pain Score: 5 (03/19 1404)  O2 Device: Nasal cannula (03/19 0400)  O2 Flow Rate (L/min):  [2 l/min] 2 l/min (03/19 0400)  SpO2:  [91 %-100 %] 96 % (03/19 0600)    Intake/Output                         03/25/22 0600 - 03/26/22 0559 03/26/22 0600 - 03/27/22 0559     [id number] 1800-0559 Total 0600-1759 [id number] Total                 Intake    I.V.  2750  -- 2750  --  -- --    Total Intake 2750 -- 2750 -- -- --       Output    Urine  245  -- 245  500  -- 500    Drains  60  50 110  10  -- 10    Blood  200  -- 200  --  -- --    Total Output 505 50 555 510 -- 510       Net I/O     2245 -50 2195 -510 -- -510            Patient Lines/Drains/Airways Status       Active PICC Line / CVC Line / PIV Line / Drain / Airway / Intraosseous Line / Epidural Line / ART Line / Line Type / Wound / Pressure Ulcer Injury       Name Placement date Placement time Site Days    Peripheral IV - 20 G  Right Antecubital 03/25/22  0602  Antecubital  1    Peripheral IV - 20 G Left Wrist 03/25/22  0741  Wrist  1    Closed/Suction Drain -  Lateral RLQ 03/25/22  1327  RLQ  1    Closed/Suction Drain -  Lateral LLQ 03/25/22  1327  LLQ  1    NG/OG Tube -  Nasogastric 18 fr Right nostril 03/25/22  1237  Right nostril  1    Colostomy -  LLQ 03/20/21  --  LLQ  371    Ileostomy -  Loop ileostomy RLQ 03/25/22  1400  RLQ  1    Indwelling Urinary Catheter -  03/25/22 OR Latex free;Straight-tip 16 fr 10 ml 03/25/22  0800  Latex free;Straight-tip  1    Wound Vac -  Surgical Medial Abdomen 03/25/22  1344  -- 1    Incision -  03/25/22 0942 Abdomen Upper 03/25/22  0942  -- 1                    Diet Diet NPO; No; Patient intolerant of oral intake    No data recorded     Labs:  CBC  Recent Labs     03/25/22  1448 03/26/22  0627   WBC 14.9* 11.1*   HGB 13.1 13.0   HCT 39.6 39.4   PLT 259 246   SEG 88  --    LYMPHS 4  --    MONOS 8  --        Chemistry  Recent Labs     03/25/22  1448 03/26/22  0627   NA 138 137   K 4.8 5.7*   CL 107 106   BICARB 22 18*   BUN 13 16   CREAT 0.77 0.99*   GLU 191* 236*   [place] 8.3* 8.5   MG 1.7  --    PHOS 4.5  --        No results for input(s): "ALK", "AST", "ALT", "TBILI", "DBILI", "ALB" in the last 72 hours.    Coags  No results for input(s): "PT", "PTT", "INR" in the last 72 hours.    Radiology:   X-Ray Abdomen Post Tube Placement    Result Date: 03/26/2022  IMPRESSION: 1. Suction-type enteric tube, coursing below the diaphragm, with side-port projecting over the at or just beyond the GE junction , with tip projecting over the gastric fundus. 2. Partially imaged surgical drain projects over the left lower quadrant of the abdomen. 3. Nonobstructive bowel gas pattern.      Microbiology:  Microbiology Results (last 30 days)       ** No results found for the last 720 hours. **              Physical Exam:  General Appearance: healthy, alert, no distress, pleasant affect, cooperative, cooperative.  Heart:  normal  rate and regular rhythm, no murmurs, clicks, or gallops.  Lungs: clear to auscultation and percussion, no chest deformities noted and Equal chest rise bilaterally.  L abdominal drain w/ blood clot, R abdominal drain w/ serosanguinous fluid. Midline incision w/ bandage, incision c/d/I with no strikethrough. Abdomen appropriately tender for post post-operative course. Old LUQ ostomy site w/ prevena vac to seal, R ileostomy pouched, & pink.   Foley catheter w/ yellow urine   Extremities:  no cyanosis, clubbing, or edema.    Medications:  IV Meds   HYDROmorphone      lactated ringers 75 mL/hr at 03/26/22 1107     Scheduled Meds   acetaminophen  1,000 mg Q8H NR    Amikacin Sulfate Liposome  590 mg Q24H    atorvastatin  10 mg Daily    azithromycin  250 mg Daily    cholecalciferol  5,000 Units Daily    CLOFAZIMINE IRB 21-0063 50 MG CAPSULE 100 mg  100 mg BID    enoxaparin  40 mg Daily    ethambutol  800 mg Daily    ferrous sulfate  324 mg Daily    fluoxetine  40 mg Daily    [START ON 03/27/2022] lansoprazole  30 mg QAM AC    lidocaine  2 patch Q24H    loratadine  10 mg Daily  rifAMPin  600 mg Daily    salmeterol  1 puff Q12H    sodium chloride  1,000 mL Once    sodium chloride  4 mL Q12H     PRN Meds   albuterol  2 puff Q6H PRN    diphenhydrAMINE  25 mg Q4H PRN    HYDROmorphone  0.4 mg Q4H PRN    menthol  1 lozenge Q2H PRN    nalOXone  0.1 mg Q2 Min PRN    phenol  1 spray Q2H PRN         ASSESSMENT / PLAN:      Neuro: Multi-modal pain control   Tylenol 1055m IV scheduled q8  Lidocaine patches q12 prn  Dilaudid  PCA  Benadryl 25mg  IV q4 prn for itching    CV: Atorvastatin  Pulmonary: out of bed, incentive spirometry, maintaining saturations on 2L NC    #MAC  Continue home Arikayce nebulizer   Azithromycin 250mg  PO daily  Clofazimine 100mg  BID  Ethambutol 800mg  daily    Rifampin 600mg  daily  Salmeterol 1 puff q12  Albuterol 2 puff q6 prn    GI: NPO diet. Bowel regimen     FEN:  LR @75 /hr. Replete electrolytes as  needed.    GU/Renal: Strict In's and Out's.  #Hyperkalemia   K 5.7 this AM. EKG wnl, no abnormalities. S/p 1g calcium gluconate. F/u PM K     ID: Afebrile. WBC appropriate 11.1, likely post-surgical. Will culture for fevers > 101.4 F.     Hematology: Hgb stable.     Prophylaxis: Lansoprazole 30mg  daily before breakfast. Prophylatic Lovenox sub-cutaneous, SCDs while in bed.   PT/OT: Ambulate with assist QID; OOB  Status:    The patient was examined and discussed with chief resident, Dr. Astrid Divine , who agrees with the above assessment and plan.

## 2022-03-26 NOTE — Plan of Care (Signed)
Problem: Promotion of Health and Safety  Goal: Promotion of Health and Safety  Description: The patient remains safe, receives appropriate treatment and achieves optimal outcomes (physically, psychosocially, and spiritually) within the limitations of the disease process by discharge.    Information below is the current care plan.  Outcome: Progressing  Flowsheets  Taken 03/26/2022 0335  Individualized Interventions/Recommendations #1: Educate patient on how to use PCA pump  Individualized Interventions/Recommendations #2 (if applicable): Educate patient on s/s of infection  Outcome Evaluation (rationale for progressing/not progressing) every shift: Pt A&Ox4. VSS. Reports pain in throat but nowhere else. Relieved with PCA dilaudid. Tolerating 2L of oxygen via NC. On tele, NSR. NPO. NG to LIS. No output. Wound vac dressing, 141mmHg, to abd surgical incision CDI. Ileostomy WNL. No output. JP drain to right and left lower quadrant/hip. Refer to flowsheets for output. Abd binder applied. PCA pump infusing as ordered. Foley intact. Pt states understanding of how to use the PCA pump and s/s of infection. Call light within reach. Plan of care ongoing.  Taken 03/25/2022 1958  Patient /Family stated Goal: sleep  Note:

## 2022-03-26 NOTE — Op Note (Signed)
03/25/2022    PREOPERATIVE DIAGNOSES:    1) Parastomal Hernia   2) Ventral hernia  3) GERD    POSTOPERATIVE DIAGNOSES:    1) Parastomal Hernia 5cm  2) Ventral hernia 10cm  3) GERD  4) diverting loop ileostomy    PROCEDURE:    (1) Repair recurrent incisional or ventral hernia; 10cm  (2) Repair parastomal hernia with mobilization of the left rectus muscle; myofascia flap component separation  (3) Implantation of mesh or other prosthesis for ventral or incisional hernia repair     (4) ex-lap takedown of ostomy and diverting loop ileostomy formation (Dr. Astrid Divine)    SURGEON/STAFF:  R. Siraj Dermody    Panel 2: B. Abbadessa (for laparotomy, takedown of ostomy) dictated separately    RESIDENT: Bravo  Fellow: Oneita Jolly    No qualified assist was available for operation      IMPLANTS: 10x10 Gore-Tex Enform mesh   NEEDLE AND SPONGE COUNT:  Correct.  INTRAOPERATIVE COMPLICATIONS:  None.     INDICATIONS:  Tiffany Chen is 77 year old female patient who presented for evaluation of parastomal hernia repair at the time of the pouch exclusion and exploratory laparotomy by the colorectal surgery service.  She was also consented for possible hiatal hernia repair with fundoplication for her GERD if hemodynamically able and advised at the end of ostomy takedown. She is a candidate for repair.   The risks and benefits of repair and conservative management including relocation to the contralateral side (patient stated vigorous opposition to relocation) were discussed.  These included but were not limited to, visceral injury, mesh infection, intrabdominal infection, fistula, hernia recurrence, dvt, pe, mi.  She was consented without duress.     OPERATION:  The patient was brought to the operating room in stable condition.   Perioperative antibiotics and sequential compression devices applied. He was laid supine on the operating room table. General anesthesia was induced by the Anesthesia Service without difficulty.      Dr. Astrid Divine  started the operation and his portion will be dictated separately. His procedure took longer than expected due to dense adhesions and requiring a low rectal anastomosis and diverting loop ileostomy.    I was then called to the room to perform hernia repair and consider her antireflux surgery as well. Due to the prolonged operative time, the bowel was becoming edematous and would be high risk for performing fundoplication at this operation; therefore, fundoplication was not performed. Attention was directed to repair the abdominal wall.     The anterior facia at the level of the stoma was dissected free and the hernia sac excised. The stomal hernia size was 5cm. The ventral hernia/midline incision size was 15cm. There was undue tension when trying to repair the anterior sheath, so the rectus muscle was mobilized.  The posterior fascia was then taken down in a flap.  Here was placed a 10x10 gore enform. The posterior sheath was then closed back to linea alba while sewing the enform in place. The anterior sheath could not be closed well with PDS sutures in cranial-caudal direction.    Next the colorectal surgeons reoriented the bowel and passed the diverting ileostomy through the right rectus muscle. The ventral hernias was then repaired with a midline closure was then undertaken with figure-of-eight 1 PDS suture, incorporating the linea alba and anterior sheath with care to avoid the underlying musculature. A 19Fr JP drain was placed in the subcutaneous space and tunneled out the left lower quadrant.  Soft tissue was then closed in layers. Scarpa's fascia was closed with 2-0 vicryl to alleviate tension. The prior ostomy site skin was closed with purse-string vicryl suture. Insorb was then used to close the dermis. The ostomy appliance was placed. A incisional wound vac was then placed over the incision and bridged to the prior ostomy site.    All needle and sponge counts were correct, and the abdomen was wanded with  the rfid device.  No evidence of a retained sponge was found.  The patient was then taken to the PACU extubated in stable condition.

## 2022-03-26 NOTE — Plan of Care (Signed)
Problem: Promotion of Health and Safety  Goal: Promotion of Health and Safety  Description: The patient remains safe, receives appropriate treatment and achieves optimal outcomes (physically, psychosocially, and spiritually) within the limitations of the disease process by discharge.    Information below is the current care plan.  03/26/2022 1225 by Elenora Gamma, RN  Outcome: Progressing  Flowsheets  Taken 03/26/2022 1225 by Elenora Gamma, RN  Guidelines: Inpatient Nursing Guidelines  Individualized Interventions/Recommendations #1: Educate pt on how to use PCA pump and assess for overusage.  Individualized Interventions/Recommendations #3 (if applicable): Educate patient on preventing pressure sores and encouraging/helping with repositioning due to limited mobility.  Individualized Interventions/Recommendations #4 (if applicable): Checking JP drains and ensuring fluid output is serosanguinous.  Outcome Evaluation (rationale for progressing/not progressing) every shift: Pt demonstrates understanding of when to report s/s of infection, repositioning, using the PCA pump, and checking fluid in JP drains.  Taken 03/26/2022 0720 by Elenora Gamma, RN  Patient /Family stated Goal: pain management  Taken 03/26/2022 0335 by Barnabas Lister, RN  Individualized Interventions/Recommendations #2 (if applicable): Educate patient on s/s of infection  Note: N: AxO x4  CV: VSS  R:1L of oxygen via NC  GI: JP drains left and right side; both OTA. No redness or swelling at insertion site. Ileostomy WNL.  GU: Foley catheter intact; TES intact, stabilization device in place. Amber urine.   Skin: Incision is covered by wound vac and wound vac dressing. No leakage at the site.  [id number] weakness due to recent procedure. Limited mobility and resting in bed. Able to reposition self   Pain: Reports pain in throat and lower abdominal region. Pain being relieved with lozenges for throat and PCA pump at 0.2 mg  of hydromorphone. Patient demonstrates understanding of how to use PCA.   IV: Both IV sites are currently running with fluids. Area is clean, dry, and intact.       03/26/2022 1225 by Elenora Gamma, RN  Note:

## 2022-03-26 NOTE — Addendum Note (Signed)
Addendum  created 03/26/22 [id number] by Pincus Sanes    SmartForm saved

## 2022-03-27 DIAGNOSIS — Z79899 Other long term (current) drug therapy: Secondary | ICD-10-CM

## 2022-03-27 LAB — HEMOGRAM, BLOOD
Hct: 30.6 % — ABNORMAL LOW (ref 34.0–45.0)
Hgb: 10.1 gm/dL — ABNORMAL LOW (ref 11.2–15.7)
MCH: 31.6 pg (ref 26.0–32.0)
MCHC: 33 g/dL (ref 32.0–36.0)
MCV: 95.6 um3 — ABNORMAL HIGH (ref 79.0–95.0)
MPV: 10.4 fL (ref 9.4–12.4)
Plt Count: 181 10*3/uL (ref 140–370)
RBC: 3.2 10*6/uL — ABNORMAL LOW (ref 3.90–5.20)
RDW: 12.8 % (ref 12.0–14.0)
WBC: 8.4 10*3/uL (ref 4.0–10.0)

## 2022-03-27 LAB — BASIC METABOLIC PANEL, BLOOD
Anion Gap: 11 mmol/L (ref 7–15)
BUN: 10 mg/dL (ref 8–23)
Bicarbonate: 21 mmol/L — ABNORMAL LOW (ref 22–29)
Calcium: 8.3 mg/dL — ABNORMAL LOW (ref 8.5–10.6)
Chloride: 105 mmol/L (ref 98–107)
Creatinine: 0.69 mg/dL (ref 0.51–0.95)
Glucose: 150 mg/dL — ABNORMAL HIGH (ref 70–99)
Potassium: 4.5 mmol/L (ref 3.5–5.1)
Sodium: 137 mmol/L (ref 136–145)
eGFR Based on CKD-EPI 2021 Equation: >60 mL/min/{1.73_m2}

## 2022-03-27 LAB — ECG 12-LEAD
ATRIAL RATE: 94 {beats}/min
ECG INTERPRETATION: NORMAL
P AXIS: 32 degrees
PR INTERVAL: 152 ms
QRS INTERVAL/DURATION: 74 ms
QT: 368 ms
QTc (Bazett): 460 ms
QTc (Fredericia): 427 ms
R AXIS: 42 degrees
T AXIS: 57 degrees
VENTRICULAR RATE: 94 {beats}/min

## 2022-03-27 LAB — MAGNESIUM, BLOOD: Magnesium: 1.6 mg/dL (ref 1.6–2.4)

## 2022-03-27 LAB — PHOSPHORUS, BLOOD: Phosphorous: 2.1 mg/dL — ABNORMAL LOW (ref 2.7–4.5)

## 2022-03-27 LAB — MRSA SURVEILLANCE CULTURE

## 2022-03-27 MED ORDER — ACETAMINOPHEN 10 MG/ML IV SOLN
1000.0000 mg | Freq: Three times a day (TID) | INTRAVENOUS | Status: AC
Start: 2022-03-27 — End: 2022-03-28
  Administered 2022-03-27 – 2022-03-28 (×3): 1000 mg via INTRAVENOUS
  Filled 2022-03-27 (×3): qty 100

## 2022-03-27 MED ORDER — CLOFAZIMINE IRB 21-0063 50 MG CAPSULE
100.0000 mg | Freq: Every day | Status: DC
Start: 2022-03-28 — End: 2022-04-03
  Administered 2022-03-30 – 2022-04-03 (×4): 100 mg via ORAL
  Filled 2022-03-27: qty 2

## 2022-03-27 MED ORDER — DEXTROSE-NACL 5-0.9 % IV SOLN (CUSTOM)
INTRAVENOUS | Status: DC
Start: 2022-03-27 — End: 2022-03-31

## 2022-03-27 MED ORDER — SODIUM PHOSPHATE 10 MEQ/50 ML NS (~~LOC~~)
10.0000 meq | Freq: Once | Status: AC
Start: 2022-03-27 — End: 2022-03-27
  Administered 2022-03-27: 10 meq via INTRAVENOUS
  Filled 2022-03-27: qty 50

## 2022-03-27 NOTE — Plan of Care (Signed)
Problem: Promotion of Health and Safety  Goal: Promotion of Health and Safety  Description: The patient remains safe, receives appropriate treatment and achieves optimal outcomes (physically, psychosocially, and spiritually) within the limitations of the disease process by discharge.    Information below is the current care plan.  Outcome: Progressing  Flowsheets  Taken 03/27/2022 1517 by Marene Lenz, RN  Guidelines: Inpatient Nursing Guidelines  Individualized Interventions/Recommendations #1: Monitor abdominal surgical incision for bleeding and s/sx of infection.  Individualized Interventions/Recommendations #2 (if applicable): Monitor for pain and dicomfort. Continue on PCA Dilaudid, scheduled tylenol and lidocaine patch.  Individualized Interventions/Recommendations #3 (if applicable): Empty JP drains, NGT, wound vac, Ostomy and foley catheter, monitor for output and patency.  Individualized Interventions/Recommendations #4 (if applicable): Monitor fluids and electrolytes, replacaed sodium phosphates today. Change IV fluid to D5 NS at 15ml/hr.  Individualized Interventions/Recommendations #5 (if applicable): Turn and reposiotion q2hr5s for skin integrity.  Outcome Evaluation (rationale for progressing/not progressing) every shift: Patient alert and oriiented. Pain well controlled with pain regimen. Abdominal surgical dressing clean, dry and intact. JP drains intact and patent and draining serosanguinous output, wound vac no output, ostomy with small amount of green liquid stool. NGT continue on LIS and draing pink tinged output. Turning and repositioning with 2 person assist. Continue plan of care.  Taken 03/26/2022 2038 by Barnabas Lister, RN  Patient /Family stated Goal: pain management  Note:

## 2022-03-27 NOTE — Progress Notes (Signed)
MIS Surgery Progress Note    Patient Name: Tiffany Chen MRN: [id number]    Hospital Day:   2 days - Admitted on: 03/25/2022  Post Operative Day(s): Day of surgery 3/18    HPI: 57F with asymptomatic GERD and history of RUL MAC requiring lobectomy as well as symtpomatic parastomal hernia. S/p repair of recurrent incisional/ventral hernia, repair of parastomal hernia with mobilization of the left rectus muscle w/ myofascial flap component separation w/ mesh, as well as ostomy takedown w/ creation of diverting loop ostomy w/ Dr. Hillery Hunter and Dr. Astrid Divine 3/18    Events/Subjective:  - NAOE, AFVSS  - NGT with pink tinged output, likely stained from lozenges  - Ostomy with some bowel sweat, but not productive yet    Vitals signs:  Latest entry:   Temperature: 97.7 F (36.5 C)  Heart Rate: 82  Respirations: 18  Blood pressure (BP): 133/61  SpO2: 98 %  Weight: 67.9 kg (149 lb 12.8 oz)  Percentage Weight Change (%): 7.86 %    Temperature:  [97.7 F (36.5 C)-99.3 F (37.4 C)] 97.7 F (36.5 C) (03/20 0400)  Blood pressure (BP): (104-138)/(58-80) 133/61 (03/20 0400)  Heart Rate:  [82-105] 82 (03/20 0831)  Respirations:  [18-25] 18 (03/20 0831)  Pain Score: 2 (03/20 0728)  O2 Device: None (Room air) (03/20 0400)  SpO2:  [94 %-98 %] 98 % (03/20 0831)    Intake/Output                         03/26/22 0600 - 03/27/22 0559 03/27/22 0600 - 03/28/22 0559     [id number] 1800-0559 Total 0600-1759 1800-0559 Total                 Intake    Total Intake -- -- -- -- -- --       Output    Urine  1600  600 2200  --  -- --    Emesis/NG Output  --  300 300  --  -- --    Drains  10  50 60  --  -- --    Stool  --  0 0  --  -- --    Total Output 1610 950 2560 -- -- --       Net I/O     -1610 -950 -2560 -- -- --            Patient Lines/Drains/Airways Status       Active PICC Line / CVC Line / PIV Line / Drain / Airway / Intraosseous Line / Epidural Line / ART Line / Line Type / Wound / Pressure Ulcer Injury       Name Placement date  Placement time Site Days    Peripheral IV - 20 G Right Antecubital 03/25/22  0602  Antecubital  2    Peripheral IV - 20 G Left Wrist 03/25/22  0741  Wrist  2    Closed/Suction Drain -  Lateral RLQ 03/25/22  1327  RLQ  1    Closed/Suction Drain -  Lateral LLQ 03/25/22  1327  LLQ  1    NG/OG Tube -  Nasogastric 18 fr Right nostril 03/25/22  1237  Right nostril  1    Colostomy -  LLQ 03/20/21  --  LLQ  372    Ileostomy -  Loop ileostomy RLQ 03/25/22  1400  RLQ  1    Indwelling Urinary Catheter -  03/25/22 OR Latex  free;Straight-tip 16 fr 10 ml 03/25/22  0800  Latex free;Straight-tip  2    Wound Vac -  Surgical Medial Abdomen 03/25/22  1344  -- 1    Incision -  03/25/22 0942 Abdomen Upper 03/25/22  0942  -- 1                    Diet Diet NPO; No; Patient intolerant of oral intake    No data recorded     Labs:  CBC  Recent Labs     03/25/22  1448 03/26/22  0627 03/27/22  0635   WBC 14.9* 11.1* 8.4   HGB 13.1 13.0 10.1*   HCT 39.6 39.4 30.6*   PLT 259 246 181   SEG 88  --   --    LYMPHS 4  --   --    MONOS 8  --   --        Chemistry  Recent Labs     03/25/22  1448 03/26/22  0627 03/26/22  1634 03/27/22  0635   NA 138   < > 136 137   K 4.8   < > 4.8 4.5   CL 107   < > 106 105   BICARB 22   < > 22 21*   BUN 13   < > 13 10   CREAT 0.77   < > 0.85 0.69   GLU 191*   < > 166* 150*   [place] 8.3*   < > 8.4* 8.3*   MG 1.7  --   --  1.6   PHOS 4.5  --   --  2.1*    < > = values in this interval not displayed.       No results for input(s): "ALK", "AST", "ALT", "TBILI", "DBILI", "ALB" in the last 72 hours.    Coags  No results for input(s): "PT", "PTT", "INR" in the last 72 hours.    Radiology:   X-Ray Abdomen Post Tube Placement    Result Date: 03/26/2022  IMPRESSION: 1. Suction-type enteric tube, coursing below the diaphragm, with side-port projecting over the at or just beyond the GE junction , with tip projecting over the gastric fundus. 2. Partially imaged surgical drain projects over the left lower quadrant of the abdomen. 3.  Nonobstructive bowel gas pattern.      Microbiology:  Microbiology Results (last 30 days)       ** No results found for the last 720 hours. **              Physical Exam:  General Appearance: healthy, alert, no distress, pleasant affect, cooperative, cooperative.  Heart:  normal rate and regular rhythm, no murmurs, clicks, or gallops.  Lungs: clear to auscultation and percussion, no chest deformities noted and Equal chest rise bilaterally.  L abdominal drain w/ blood clot, R abdominal drain w/ serosanguinous fluid. Midline incision w/ bandage, incision c/d/I with no strikethrough. Abdomen appropriately tender for post post-operative course. Old LUQ ostomy site w/ prevena vac to seal, R ileostomy pouched, & pink.   Foley catheter w/ yellow urine   Extremities:  no cyanosis, clubbing, or edema.    Medications:  IV Meds   dextrose-sodium chloride 5%-0.9%      HYDROmorphone       Scheduled Meds   acetaminophen  1,000 mg Q8H NR    Amikacin Sulfate Liposome  590 mg Q24H    atorvastatin  10 mg Daily    azithromycin  250 mg Daily    cholecalciferol  5,000 Units Daily    CLOFAZIMINE IRB 21-0063 50 MG CAPSULE 100 mg  100 mg BID    enoxaparin  40 mg Daily    ethambutol  800 mg Daily    ferrous sulfate  324 mg Daily    fluoxetine  40 mg Daily    lidocaine  2 patch Q24H    loratadine  10 mg Daily    pantoprazole  40 mg Daily    rifAMPin  600 mg Daily    salmeterol  1 puff Q12H     PRN Meds   albuterol  2 puff Q6H PRN    diphenhydrAMINE  25 mg Q4H PRN    HYDROmorphone  0.4 mg Q4H PRN    menthol  1 lozenge Q2H PRN    nalOXone  0.1 mg Q2 Min PRN    phenol  1 spray Q2H PRN    sodium chloride  4 mL Q12H PRN         ASSESSMENT / PLAN:    Tiffany Chen with history of GERD, parastomal hernia, 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 Hillery Hunter and Dr. Astrid Divine 3/18.     Recovering appropriately.     Neuro: Multi-modal pain control   Tylenol 10106m IV scheduled q8  Lidocaine patches q12 prn  Dilaudid  PCA  Benadryl 25mg  IV q4 prn for itching    CV: Atorvastatin  Pulmonary: out of bed, incentive spirometry, maintaining saturations on room air  Encourage IS every hour while awake    #MAC  Continue home Arikayce nebulizer   Azithromycin 250mg  PO daily  Clofazimine 100mg  BID  Ethambutol 800mg  daily    Rifampin 600mg  daily  Salmeterol 1 puff q12  Albuterol 2 puff q6 prn    GI: Maintain NGT to LIS. Flush NGT with 20 mL once per shift to maintain patency.   Continue mIVF. NPO until having ROBF.  Continue PPI      FEN:  mIVF. Monitor and Replete electrolytes as needed.    GU/Renal: Strict In's and Out's.  #Hyperkalemia secondary to low volume depletion  K 5.7 EKG wnl, no abnormalities. S/p 1g calcium gluconate. 1L bolus  K this AM stable 4.5, Cr 0.69    ID: Afebrile. WBC 8.4    Hematology: Hgb 10.1 (13) likely hemodiluted   Drain to bulb suction, monitor output    Prophylaxis: Pantoprazole IV. Prophylatic Lovenox sub-cutaneous, SCDs while in bed.   PT/OT: Ambulate with assist QID; OOB    The patient was examined and discussed with chief resident and team and seen with  Dr. Astrid Divine , who agrees with the above assessment and plan.     Sindy Guadeloupe,  NP  Surgery service [phone]

## 2022-03-27 NOTE — Progress Notes (Signed)
Pharmacist Admission Medication Reconciliation    Date Performed: 03/27/2022    Information obtained from:   [name] ([place]) [facility], [facility]    Allergies: Unable to confirm  Allergies   Allergen Reactions    Walnuts Swelling     Tongue swollen and burn       Changes made to PTA (Prior to Admission) medication list:      Medications removed are indicated as "discontinued" in red in the PTA med list below    Adherence assessment:   Patient adherent to all medications except for those noted in med list below     Medication Insight:  Required prompting for indication, dose, strength and frequency of most medications    Confident in accuracy of medication list?  YES - Able to confirm accuracy of PTA med list with reliable sources    PTA medication list has been updated to reflect the best possible medication history. Providers notified of any findings and updates to PTA medication list in Epic.  Current Outpatient Medications as of 03/27/2022         Disp Refills Start End    ARIKAYCE 590 MG/8.4ML SUSP 28 each 5 10/02/2021 --    Inhale the contents of one vial (590mg ) via Lamira device 1 time daily as directed.    atorvastatin (LIPITOR) 10 MG tablet 90 tablet 2 11/07/2021 --    Take 1 tablet (10 mg) by mouth daily. - Oral    azithromycin (ZITHROMAX) 250 MG tablet 60 tablet 11 07/24/2021 --    TAKE 1 TABLET BY MOUTH  DAILY    CLOFAZIMINE IRB 21-0063 50 MG CAPSULE 200 capsule 0 03/19/2022 --    Take 2 capsules (100mg ) by mouth once daily with food as directed.    CLOFAZIMINE IRB 21-0063 50 MG CAPSULE (Discontinued) 200 capsule 0 11/23/2021 03/27/2022    Take 2 capsules (100mg ) by mouth once daily with food as directed.    Reason for Discontinue: Duplicate Order (Does not say to discontinue on AVS)    CLOFAZIMINE IRB 21-0063 50 MG CAPSULE (Discontinued) 200 capsule 0 05/10/2021 03/27/2022    Take 2 capsules (100mg ) by mouth once daily with food as directed.    Reason for Discontinue: Duplicate  Order (Does not say to discontinue on AVS)    ethambutol (MYAMBUTOL) 400 MG tablet 120 tablet 11 07/24/2021 --    TAKE 2 TABLETS BY MOUTH  DAILY    ferrous sulfate 325 (65 Fe) MG tablet -- --  --    Take 1 tablet (325 mg) by mouth daily. - Oral    fluoxetine (PROZAC) 40 MG capsule 90 capsule 3 02/12/2022 --    Take 1 capsule (40 mg) by mouth daily. - Oral    Renewals       Renewal provider: Claudine Mouton, MD            loratadine (CLARITIN) 10 MG tablet (Discontinued) -- --  03/27/2022    1 tablet    Reason for Discontinue: Pt No Longer Taking (Does not say to discontinue on AVS)    metFORMIN (GLUCOPHAGE) 500 mg tablet 180 tablet 1 03/13/2022 --    Take 1 tablet (500 mg) by mouth 2 times daily (with meals). - Oral    omeprazole (PRILOSEC) 20 MG capsule 90 capsule 3 03/28/2021 --    TAKE 1 CAPSULE BY MOUTH IN  THE MORNING BEFORE  BREAKFAST    Notes to Pharmacy: Requesting 1 year supply  Pediatric Multivitamins-Fl (MULTIVITAMINS/FL PO) (Discontinued) -- --  03/27/2022    Reason for Discontinue: Pt No Longer Taking (Does not say to discontinue on AVS)    ramipril (ALTACE) 2.5 MG capsule 90 capsule 1 10/30/2021 --    Take 1 capsule (2.5 mg) by mouth daily. - Oral    rifAMPin (RIFADIN) 300 MG capsule 120 capsule 11 07/24/2021 --    TAKE 2 CAPSULES BY MOUTH  DAILY    sodium chloride 7 % NEBU 720 mL 3 10/01/2021 --    USE 1 VIAL VIA NEBULIZER  EVERY 12 HOURS    Notes to Pharmacy: Please send a replace/new response with 90-Day Supply if appropriate to maximize member benefit. Requesting 1 year supply.        Arna Medici 03/27/2022 12:31 PM  Patient has not been using this medications for several months as it is expensive and hard for her to keep up with all the other meds. She still has one box remaining at home             umeclidinium-vilanterol (ANORO ELLIPTA) 62.5-25 MCG/ACT inhaler 60 each 11 12/19/2021 --    Inhale 1 puff by mouth daily. - Inhalation    VENTOLIN HFA 108 (90 Base) MCG/ACT inhaler 72 g 3 04/23/2021  --    USE 2 INHALATIONS BY MOUTH  EVERY 6 HOURS AS NEEDED FOR WHEEZING    Notes to Pharmacy: Requesting 1 year supply    vitamin D3 125 MCG (5000 UT) capsule -- --  --    Take 1 capsule (5,000 Units) by mouth every 24 hours. - Oral            Admit medication reconciliation performed by pharmacist with the following findings:  -Patient on ANORO ELLIPT 62.5-25 INH 1 puff inhaled daily - formulary change would be salmeterol + tiotropium 1 puff daily. Tiotropium not resumed alongside salmeterol, consider resuming as appropriate.        Arna Medici, [facility]  Pharmacy Technician  [phone]  Reviewed by Caprice Beaver, PharmD

## 2022-03-27 NOTE — Plan of Care (Signed)
Problem: Promotion of Health and Safety  Goal: Promotion of Health and Safety  Description: The patient remains safe, receives appropriate treatment and achieves optimal outcomes (physically, psychosocially, and spiritually) within the limitations of the disease process by discharge.    Information below is the current care plan.  Outcome: Progressing  Flowsheets  Taken 03/27/2022 0345  Individualized Interventions/Recommendations #1: educate patient on side effects of pain medications  Outcome Evaluation (rationale for progressing/not progressing) every shift: Pt A&Ox4. VSS. Reports pain in abd, relieved with PCA dilaudid. Tolerating 2L of oxygen via NC. On tele, NSR. NPO. NGT to LIS. Pink tinged drainage, refer to flowsheets for output. Wound vac dressing intact. ileostomy WNL, no output. JP drain to right and left anterior hip, refer to flowsheets for output. Foley intact. Turns self in bed. Bed alarm on. Pt states understanding of side effects of pain medications, and s/s of infection. Call light within reach. Plan of care ongoing.  Taken 03/26/2022 2038  Patient /Family stated Goal: pain management  Taken 03/26/2022 0335  Individualized Interventions/Recommendations #2 (if applicable): Educate patient on s/s of infection  Note:

## 2022-03-27 NOTE — Progress Notes (Signed)
[facility] Progress Note  Intrathecal Duramorph  Between 7-5pm, please page on-call Regional fellow/resident [phone] for questions about pain management, epidural, ketamine/lidocaine, intrathecal morphine, or nerve catheter issues.  After 5pm, please page on-call Regional fellow/resident [phone] for issues with epidurals, infusions, intrathecal morphine, or nerve catheters.  Please direct pain medication management questions to primary team.    Referring MD: Diona Browner, *  Referring Department: MIS  Acute Pain Service Attending: Librada Castronovo    History of Present Illness:     Tiffany Chen is a 77 year old female with a PMH significant for RUL MAC, parastomal hernia, GERD who was admitted on 03/25/2022 . The patient had a lumbar single injection intrathecal duramorph placed at level L4 for postoperative pain.     The primary service has requested that we evaluate and manage the patient's postoperative pain.    Preoperative and postoperative medications have been ordered following extensive review of chart and are noted to be appropriate for patient based on medical history, allergies, age and current medication regimen. The following preoperative medications were ordered: APAP.    CURES: reviewed    Interval History:  The patient is POD 2, s/p open VHR, colostomy takedown given 270mcg preoperative intrathecal duramorph    The patient currently reports pain is located in the abdomen with a pain score of 0-5/10 and describes the pain as aching.    Overnight events: reviewed    Apnea events overnight: none      Diet: Diet NPO; No; Patient intolerant of oral intake with NGT    Ambulatory Status: able to participate in physical therapy    DVT prophylaxis/anti-coagulants: Lovenox 40mg  daily    Pre-admission pain meds:  none    Home Medications:  Facility-Administered Medications Prior to Admission   Medication Dose Route Frequency Provider Last Rate Last Admin    lidocaine (XYLOCAINE) 2 % viscous  solution 1 mL  1 mL Other PRN Broderick, Ethelle Lyon, MD        lidocaine (XYLOCAINE) 4 % topical   Other PRN Diona Browner, MD         Medications Prior to Admission   Medication Sig Dispense Refill Last Dose    ARIKAYCE 590 MG/8.4ML SUSP Inhale the contents of one vial (590mg ) via Lamira device 1 time daily as directed. 28 each 5 03/24/2022    atorvastatin (LIPITOR) 10 MG tablet Take 1 tablet (10 mg) by mouth daily. 90 tablet 2 Past Week    azithromycin (ZITHROMAX) 250 MG tablet TAKE 1 TABLET BY MOUTH  DAILY 60 tablet 11 Past Week    CLOFAZIMINE IRB 21-0063 50 MG CAPSULE Take 2 capsules (100mg ) by mouth once daily with food as directed. 200 capsule 0 Past Week    CLOFAZIMINE IRB 21-0063 50 MG CAPSULE Take 2 capsules (100mg ) by mouth once daily with food as directed. 200 capsule 0 Past Week    CLOFAZIMINE IRB 21-0063 50 MG CAPSULE Take 2 capsules (100mg ) by mouth once daily with food as directed. 200 capsule 0 Past Week    ethambutol (MYAMBUTOL) 400 MG tablet TAKE 2 TABLETS BY MOUTH  DAILY 120 tablet 11 Past Week    ferrous sulfate 325 (65 Fe) MG tablet Take 1 tablet (325 mg) by mouth 3 times daily.   Past Week    fluoxetine (PROZAC) 40 MG capsule Take 1 capsule (40 mg) by mouth daily. 90 capsule 3 Past Week    loratadine (CLARITIN) 10 MG tablet 1 tablet  Past Week    metFORMIN (GLUCOPHAGE) 500 mg tablet Take 1 tablet (500 mg) by mouth 2 times daily (with meals). 180 tablet 1 Past Week    omeprazole (PRILOSEC) 20 MG capsule TAKE 1 CAPSULE BY MOUTH IN  THE MORNING BEFORE  BREAKFAST 90 capsule 3 Past Week    Pediatric Multivitamins-Fl (MULTIVITAMINS/FL PO)    Past Week    ramipril (ALTACE) 2.5 MG capsule Take 1 capsule (2.5 mg) by mouth daily. 90 capsule 1 Past Week    rifAMPin (RIFADIN) 300 MG capsule TAKE 2 CAPSULES BY MOUTH  DAILY 120 capsule 11 Past Week    sodium chloride 7 % NEBU USE 1 VIAL VIA NEBULIZER  EVERY 12 HOURS 720 mL 3 Unknown    umeclidinium-vilanterol (ANORO ELLIPTA) 62.5-25 MCG/ACT inhaler  Inhale 1 puff by mouth daily. 60 each 11 Past Week    VENTOLIN HFA 108 (90 Base) MCG/ACT inhaler USE 2 INHALATIONS BY MOUTH  EVERY 6 HOURS AS NEEDED FOR WHEEZING 72 g 3 Past Week    vitamin D3 125 MCG (5000 UT) capsule Take 1 capsule (5,000 Units) by mouth every 24 hours.   Past Week       Medications:  Scheduled Meds   acetaminophen  1,000 mg Q8H NR    Amikacin Sulfate Liposome  590 mg Q24H    atorvastatin  10 mg Daily    azithromycin  250 mg Daily    cholecalciferol  5,000 Units Daily    CLOFAZIMINE IRB 21-0063 50 MG CAPSULE 100 mg  100 mg BID    enoxaparin  40 mg Daily    ethambutol  800 mg Daily    ferrous sulfate  324 mg Daily    fluoxetine  40 mg Daily    lidocaine  2 patch Q24H    loratadine  10 mg Daily    pantoprazole  40 mg Daily    rifAMPin  600 mg Daily    salmeterol  1 puff Q12H    sodium PHOSphate  10 mEq Once     PRN Meds   albuterol  2 puff Q6H PRN    diphenhydrAMINE  25 mg Q4H PRN    HYDROmorphone  0.4 mg Q4H PRN    menthol  1 lozenge Q2H PRN    nalOXone  0.1 mg Q2 Min PRN    phenol  1 spray Q2H PRN    sodium chloride  4 mL Q12H PRN     IV Meds   dextrose-sodium chloride 5%-0.9% 50 mL/hr at 03/27/22 0930    HYDROmorphone         Oral/Transdermal Opioids Administered: none  IV Opioids Administered: none  PCA Usage: dPCA 0.2/10 - 2.6mg  AM  2.6mg  PM  Epidural PCA Usage: N/A  Other Analgesic/Anxiolytics: IV APAP, lidoderm    Allergies   Allergen Reactions    Walnuts Swelling     Tongue swollen and burn     Past Medical History:   Diagnosis Date    Acquired tracheobronchomegaly with bronchiectasis (CMS-HCC) 05/22/2020    Age-related nuclear cataract of left eye 10/15/2021    Cavitary lesion of lung 07/24/2020    Combined forms of age-related cataract of both eyes 10/15/2021    Depression     DM (diabetes mellitus) (CMS-HCC)     Gastroesophageal reflux disease, unspecified whether esophagitis present 10/12/2020    Added automatically from request for surgery [id number]    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    MAI (mycobacterium avium-intracellulare) (CMS-HCC)  05/22/2020    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     Past Surgical History:   Procedure Laterality Date    BRONCHOSCOPY  12/2020    CESAREAN SECTION, LOW TRANSVERSE      COLOSTOMY      CRANIOTOMY      LUNG LOBECTOMY       Social History     Socioeconomic History    Marital status: Widowed   Tobacco Use    Smoking status: Never    Smokeless tobacco: Never   Vaping Use    Vaping Use: Never used   Substance and Sexual Activity    Alcohol use: Yes     Comment: glass of wine every other month    Drug use: Not Currently    Sexual activity: Not Currently   Social Activities of Daily Living Present    Military Service No    Blood Transfusions No    Caffeine Concern No    Occupational Exposure No    Hobby Hazards Yes     Comment: Gardening, MAC    Sleep Concern No    Stress Concern Yes     Comment: Husband death Cov19, 2020, sold home, rehomed pets. [place]    Weight Concern No    Special Diet Yes    Back Care No    Exercises Regularly Yes    Bike Helmet Use No    Seat Belt Use No    Performs Self-Exams No   Social History Narrative    Lives with brother Jeneen Rinks at home. Sister also lives in [place] History   Problem Relation Name Age of Onset    Cancer Mother Prescilla Sours ngs and throat    Heart Disease Mother Eliane Decree         Congrestive heart failure due to cancer    Hypertension Mother Eliane Decree     Psychiatry Mother Eliane Decree         Bi polar, manic depressive    Cancer Father Warren Danes         Prostate    Diabetes Father Warren Danes     Stroke Father Warren Danes      Stroke M Grandmother Gaston     Cancer Brother Dian Situ         Skin (many locations)    Diabetes Brother Dian Situ          Review of Systems:  General: Negative  Psychiatry: Negative  HEENT: Negative  Gastrointestinal: Negative  Respiratory: Negative  GU: Urinary retention no  Neuro: denies numbness, denies weakness; Perioral numbness, Dizziness, or Seizures: no  Musculoskeletal: Muscle pain  Remainder of a 12 point review of systems is negative      Labs:  Recent Labs     03/26/22  0627 03/27/22  0635   WBC 11.1* 8.4   HGB 13.0 10.1*   HCT 39.4 30.6*   PLT 246 181       Recent Labs     03/25/22  1448 03/26/22  0627 03/26/22  1634 03/27/22  [id number]  NA 138   < > 136 137   K 4.8   < > 4.8 4.5   CL 107   < > 106 105   BICARB 22   < > 22 21*   BUN 13   < > 13 10   CREAT 0.77   < > 0.85 0.69   GLU 191*   < > 166* 150*   [place]field 8.3*   < > 8.4* 8.3*   MG 1.7  --   --  1.6   PHOS 4.5  --   --  2.1*    < > = values in this interval not displayed.       No results found for: "INR", "PTT"        Imaging:  No results found for this or any previous visit.      Labs and Imaging studies reviewed.     Physical Exam:  Vitals: BP (!) 121/58 (BP Location: Left arm, BP Patient Position: Semi-Fowlers)   Pulse 83   Temp 36.7 C   Resp 18   Ht 5\' 3"  (1.6 m)   Wt 67.9 kg (149 lb 12.8 oz)   SpO2 95%   BMI 26.54 kg/m   General: alert  Mental Status:  awake, oriented x 3. Speech is clear/ normal  Affect:  euthymic  Respiratory: Breathing comfortably, speaking in full sentences.  Head/Pupils: Unremarkable  Neuro: Sensory block: absent, Motor block: absent   Injection Site: Appears Clean, dry, and intact  Skin: intact, no lesions  Incision: Clean, dry, and intact  Abdominal: Appropriately tender to palpation      Assessment/Plan:  This is a 77 year old female POD 2 Days Post-Op  with Intrathecal morphine and Intravenous controlled patient analgesia for post-operative/acute pain control. Currently with excellent pain control.  Given the patient's comorbidities and recent major surgery complicated by Inability to tolerate oral intake and Inability to ambulate post-operative/acute pain should continue to be treated with Intravenous controlled patient analgesia. Past medical history, labs and medications have been reviewed with patient. Relevant daily labs and coagulation status have been reviewed for increased risk of epidural hematoma and adverse medication reactions. Patients hemodynamic status has been continuously evaluated throughout the last 24 hours and has not had episodes of hypotension.    Past medical history, labs and medications have been reviewed with patient. Relevant daily labs and coagulation status have been reviewed for increased risk of epidural hematoma and adverse medication reactions.    Pain/Neuro: Evaluation of neurologic exam showed no evidence of epidural hematoma.  - Neuraxial morphine now resolved.  - Continue IV dilaudid PCA PRN breakthrough pain until diet advanced/NGT removed.  - Continue IV Tylenol, lidoderm patches.  - Encourage OOB to chair QID, ambulation.  CV: hemodynamically stable  Resp: Patients respiratory status has been intermittently evaluated by RN throughout the last 24 hours and has not had episodes of respiratory depression or apnea.   - Stable and is not showing signs of over sedation.   -  With PCA continue monitoring for respiratory depression with continuous end tidal CO2 monitoring  FEN/GI:  Diet NPO; No; Patient intolerant of oral intake   Renal: Patient is not having urinary retention   - Defer to primary team for foley management.   Heme/ID: afebrile, WBC wnl, platelets stable  PPx: Lovenox per primary team.       Patient counseled on the above recommendations. Plan discussed and coordinated with primary service.    Between 7-5pm, please  page on-call Regional fellow/resident [phone] for questions about pain management, epidural, ketamine/lidocaine, intrathecal morphine, or nerve catheter  issues.  After 5pm, please page on-call Regional fellow/resident [phone] for issues with epidurals, infusions, intrathecal morphine, or nerve catheters.  Please direct pain medication management questions to primary team.    Note Author: Morley Kos, MD    Medical Decision Making  Today Reviewed CURES, Reviewed note(s) from surgical team, Reviewed note(s) from nursing, Reviewed labs, Reviewed medications and allergies, Reviewed medication administration report, Reviewed last 24 hours of vital signs and intake and output, Discussed/messaged regarding case with surgical team, and Discussed/messaged regarding case with nurse. Reviewed note(s) from MIS, History obtained from patient and friend/family caregiver, and Discussed/messaged regarding case with provider MIS    Risks applicable to today's encounter and procedural risks relevant to the patient include:   Intrathecal drug management with monitoring for toxicity including oxymetry, sedation scales and end-tidal CO2, Intravenous controlled substances management with monitoring for toxicity including oxymetry, sedation scales and end-tidal CO2, Cardiovascular Risks, Pulmonary Risks, Neurological Risks, Risk of severe or worsening of pain in the setting of very recent major surgery or ongoing/evolving medical pathology, and Risk of severe or worsening of pain in the setting of other medical, psychological, social or economic risk factors

## 2022-03-28 ENCOUNTER — Inpatient Hospital Stay (HOSPITAL_BASED_OUTPATIENT_CLINIC_OR_DEPARTMENT_OTHER): Payer: Medicare Other

## 2022-03-28 DIAGNOSIS — Z4659 Encounter for fitting and adjustment of other gastrointestinal appliance and device: Secondary | ICD-10-CM

## 2022-03-28 DIAGNOSIS — R143 Flatulence: Secondary | ICD-10-CM

## 2022-03-28 LAB — PHOSPHORUS, BLOOD: Phosphorous: 2.1 mg/dL — ABNORMAL LOW (ref 2.7–4.5)

## 2022-03-28 LAB — BASIC METABOLIC PANEL, BLOOD
Anion Gap: 9 mmol/L (ref 7–15)
BUN: 9 mg/dL (ref 8–23)
Bicarbonate: 24 mmol/L (ref 22–29)
Calcium: 8.3 mg/dL — ABNORMAL LOW (ref 8.5–10.6)
Chloride: 105 mmol/L (ref 98–107)
Creatinine: 0.63 mg/dL (ref 0.51–0.95)
Glucose: 147 mg/dL — ABNORMAL HIGH (ref 70–99)
Potassium: 3.9 mmol/L (ref 3.5–5.1)
Sodium: 138 mmol/L (ref 136–145)
eGFR Based on CKD-EPI 2021 Equation: >60 mL/min/{1.73_m2}

## 2022-03-28 LAB — HEMOGRAM, BLOOD
Hct: 29.3 % — ABNORMAL LOW (ref 34.0–45.0)
Hgb: 9.6 gm/dL — ABNORMAL LOW (ref 11.2–15.7)
MCH: 31.3 pg (ref 26.0–32.0)
MCHC: 32.8 g/dL (ref 32.0–36.0)
MCV: 95.4 um3 — ABNORMAL HIGH (ref 79.0–95.0)
MPV: 10 fL (ref 9.4–12.4)
Plt Count: 194 10*3/uL (ref 140–370)
RBC: 3.07 10*6/uL — ABNORMAL LOW (ref 3.90–5.20)
RDW: 12.6 % (ref 12.0–14.0)
WBC: 6.4 10*3/uL (ref 4.0–10.0)

## 2022-03-28 LAB — GLUCOSE (POCT)
Glucose (POCT): 122 mg/dL — ABNORMAL HIGH (ref 70–99)
Glucose (POCT): 141 mg/dL — ABNORMAL HIGH (ref 70–99)
Glucose (POCT): 145 mg/dL — ABNORMAL HIGH (ref 70–99)
Glucose (POCT): 147 mg/dL — ABNORMAL HIGH (ref 70–99)
Glucose (POCT): 148 mg/dL — ABNORMAL HIGH (ref 70–99)

## 2022-03-28 LAB — GLYCOSYLATED HGB(A1C), BLOOD: Glyco Hgb (A1C): 6.3 % — ABNORMAL HIGH (ref 4.8–5.8)

## 2022-03-28 LAB — MAGNESIUM, BLOOD: Magnesium: 1.7 mg/dL (ref 1.6–2.4)

## 2022-03-28 MED ORDER — GLUCAGON HCL 1 MG IJ SOLR
1.0000 mg | Freq: Once | INTRAMUSCULAR | Status: DC | PRN
Start: 2022-03-28 — End: 2022-04-03

## 2022-03-28 MED ORDER — DEXTROSE 50 % IV SOLN
12.5000 g | INTRAVENOUS | Status: DC | PRN
Start: 2022-03-28 — End: 2022-04-03

## 2022-03-28 MED ORDER — ACETAMINOPHEN 10 MG/ML IV SOLN
1000.0000 mg | Freq: Three times a day (TID) | INTRAVENOUS | Status: AC
Start: 2022-03-28 — End: 2022-03-29
  Administered 2022-03-28 – 2022-03-29 (×3): 1000 mg via INTRAVENOUS
  Filled 2022-03-28 (×3): qty 100

## 2022-03-28 MED ORDER — INSULIN REGULAR HUMAN 100 UNIT/ML IJ SOLN
0.0000 [IU] | Freq: Four times a day (QID) | INTRAMUSCULAR | Status: DC
Start: 2022-03-28 — End: 2022-04-03
  Administered 2022-03-29 – 2022-04-01 (×3): 1 [IU] via SUBCUTANEOUS
  Administered 2022-04-01: 2 [IU] via SUBCUTANEOUS
  Administered 2022-04-02 – 2022-04-03 (×2): 1 [IU] via SUBCUTANEOUS
  Filled 2022-03-28 (×3): qty 1
  Filled 2022-03-28: qty 2
  Filled 2022-03-28 (×2): qty 1

## 2022-03-28 MED ORDER — MAGNESIUM SULFATE 2 GM/50ML IV SOLN
2.0000 g | Freq: Once | INTRAVENOUS | Status: AC
Start: 2022-03-28 — End: 2022-03-28
  Administered 2022-03-28: 2 g via INTRAVENOUS
  Filled 2022-03-28: qty 50

## 2022-03-28 MED ORDER — DEXTROSE (DIABETIC USE) 40 % OR GEL
1.0000 | ORAL | Status: DC | PRN
Start: 2022-03-28 — End: 2022-04-03

## 2022-03-28 MED ORDER — TIOTROPIUM BROMIDE MONOHYDRATE 18 MCG IN CAPS
1.0000 | ORAL_CAPSULE | Freq: Every day | RESPIRATORY_TRACT | Status: DC
Start: 2022-03-28 — End: 2022-04-03
  Administered 2022-03-28 – 2022-04-03 (×7): 18 ug via RESPIRATORY_TRACT
  Filled 2022-03-28 (×2): qty 5

## 2022-03-28 MED ORDER — LACTATED RINGERS IV SOLN
Freq: Once | INTRAVENOUS | Status: AC
Start: 2022-03-28 — End: 2022-03-28

## 2022-03-28 MED ORDER — GLUCOSE 4 GM PO CHEW (CUSTOM)
4.0000 | CHEWABLE_TABLET | ORAL | Status: DC | PRN
Start: 2022-03-28 — End: 2022-04-03

## 2022-03-28 MED ORDER — INSULIN GLARGINE 100 UNIT/ML SC SOLN
0.1000 [IU]/kg | Freq: Every day | SUBCUTANEOUS | Status: DC
Start: 2022-03-28 — End: 2022-04-03
  Administered 2022-03-28 – 2022-04-03 (×7): 7 [IU] via SUBCUTANEOUS
  Filled 2022-03-28 (×7): qty 7

## 2022-03-28 MED ORDER — INSULIN GLARGINE 100 UNIT/ML SC SOLN
0.5000 [IU]/kg | Freq: Every day | SUBCUTANEOUS | Status: DC
Start: 2022-03-28 — End: 2022-03-28

## 2022-03-28 MED ORDER — SODIUM PHOSPHATE 10 MEQ/50 ML NS (~~LOC~~)
10.0000 meq | Status: AC
Start: 2022-03-28 — End: 2022-03-28
  Administered 2022-03-28 (×2): 10 meq via INTRAVENOUS
  Filled 2022-03-28 (×2): qty 50

## 2022-03-28 NOTE — Progress Notes (Signed)
[facility] Progress Note  Intrathecal Duramorph  Between 7-5pm, please page on-call Regional fellow/resident [phone] for questions about pain management, epidural, ketamine/lidocaine, intrathecal morphine, or nerve catheter issues.  After 5pm, please page on-call Regional fellow/resident [phone] for issues with epidurals, infusions, intrathecal morphine, or nerve catheters.  Please direct pain medication management questions to primary team.    Referring MD: Diona Browner, *  Referring Department: MIS  Acute Pain Service Attending: Leighton Luster    History of Present Illness:     Tiffany Chen is a 77 year old female with a PMH significant for RUL MAC, parastomal hernia, GERD who was admitted on 03/25/2022 . The patient had a lumbar single injection intrathecal duramorph placed at level L4 for postoperative pain.     The primary service has requested that we evaluate and manage the patient's postoperative pain.    Preoperative and postoperative medications have been ordered following extensive review of chart and are noted to be appropriate for patient based on medical history, allergies, age and current medication regimen. The following preoperative medications were ordered: APAP.    CURES: reviewed    Interval History:  The patient is POD 3, s/p open VHR, colostomy takedown given 241mcg preoperative intrathecal duramorph    The patient currently reports pain is located in the abdomen with a pain score of 0-2/10 and describes the pain as aching.    Overnight events: reviewed    Apnea events overnight: none      Diet: Diet NPO; No; Patient intolerant of oral intake     Ambulatory Status: able to participate in physical therapy    DVT prophylaxis/anti-coagulants: Lovenox 40mg  daily    Pre-admission pain meds:  none    Home Medications:  Facility-Administered Medications Prior to Admission   Medication Dose Route Frequency Provider Last Rate Last Admin    lidocaine (XYLOCAINE) 2 % viscous solution  1 mL  1 mL Other PRN Broderick, Ethelle Lyon, MD        lidocaine (XYLOCAINE) 4 % topical   Other PRN Diona Browner, MD         Medications Prior to Admission   Medication Sig Dispense Refill Last Dose    ARIKAYCE 590 MG/8.4ML SUSP Inhale the contents of one vial (590mg ) via Lamira device 1 time daily as directed. 28 each 5 03/24/2022    atorvastatin (LIPITOR) 10 MG tablet Take 1 tablet (10 mg) by mouth daily. 90 tablet 2 Past Week    azithromycin (ZITHROMAX) 250 MG tablet TAKE 1 TABLET BY MOUTH  DAILY 60 tablet 11 Past Week    CLOFAZIMINE IRB 21-0063 50 MG CAPSULE Take 2 capsules (100mg ) by mouth once daily with food as directed. 200 capsule 0 Past Week    ethambutol (MYAMBUTOL) 400 MG tablet TAKE 2 TABLETS BY MOUTH  DAILY 120 tablet 11 Past Week    ferrous sulfate 325 (65 Fe) MG tablet Take 1 tablet (325 mg) by mouth daily.   Past Week    fluoxetine (PROZAC) 40 MG capsule Take 1 capsule (40 mg) by mouth daily. 90 capsule 3 Past Week    metFORMIN (GLUCOPHAGE) 500 mg tablet Take 1 tablet (500 mg) by mouth 2 times daily (with meals). 180 tablet 1 Past Week    omeprazole (PRILOSEC) 20 MG capsule TAKE 1 CAPSULE BY MOUTH IN  THE MORNING BEFORE  BREAKFAST 90 capsule 3 Past Week    ramipril (ALTACE) 2.5 MG capsule Take 1 capsule (2.5 mg) by mouth daily.  90 capsule 1 Past Week    rifAMPin (RIFADIN) 300 MG capsule TAKE 2 CAPSULES BY MOUTH  DAILY 120 capsule 11 Past Week    sodium chloride 7 % NEBU USE 1 VIAL VIA NEBULIZER  EVERY 12 HOURS 720 mL 3 Unknown    umeclidinium-vilanterol (ANORO ELLIPTA) 62.5-25 MCG/ACT inhaler Inhale 1 puff by mouth daily. 60 each 11 Past Week    VENTOLIN HFA 108 (90 Base) MCG/ACT inhaler USE 2 INHALATIONS BY MOUTH  EVERY 6 HOURS AS NEEDED FOR WHEEZING 72 g 3 Past Week    vitamin D3 125 MCG (5000 UT) capsule Take 1 capsule (5,000 Units) by mouth every 24 hours.   Past Week       Medications:  Scheduled Meds   acetaminophen  1,000 mg Q8H NR    Amikacin Sulfate Liposome  590 mg Q24H     atorvastatin  10 mg Daily    azithromycin  250 mg Daily    cholecalciferol  5,000 Units Daily    CLOFAZIMINE IRB 21-0063 50 MG CAPSULE 100 mg  100 mg Daily    [Manual MAR Hold] enoxaparin  40 mg Daily    ethambutol  800 mg Daily    ferrous sulfate  324 mg Daily    fluoxetine  40 mg Daily    insulin glargine  0.1 Units/kg Daily at Noon    insulin regular  0-10 Units Q6H    lidocaine  2 patch Q24H    loratadine  10 mg Daily    magnesium sulfate  2 g Once    pantoprazole  40 mg Daily    rifAMPin  600 mg Daily    salmeterol  1 puff Q12H    sodium PHOSphate  10 mEq Q2H     PRN Meds   albuterol  2 puff Q6H PRN    dextrose  12.5 g PRN    diphenhydrAMINE  25 mg Q4H PRN    glucagon  1 mg Once PRN    glucose  4 tablet PRN    glucose  1 Tube PRN    HYDROmorphone  0.4 mg Q4H PRN    menthol  1 lozenge Q2H PRN    nalOXone  0.1 mg Q2 Min PRN    phenol  1 spray Q2H PRN    sodium chloride  4 mL Q12H PRN     IV Meds   dextrose-sodium chloride 5%-0.9% 50 mL/hr at 03/28/22 0514    HYDROmorphone         Oral/Transdermal Opioids Administered: none  IV Opioids Administered: none  PCA Usage: dPCA 0.2/10 - 3.6mg  AM  3.4mg  PM  Epidural PCA Usage: N/A  Other Analgesic/Anxiolytics: IV APAP, lidoderm    Allergies   Allergen Reactions    Walnuts Swelling     Tongue swollen and burn     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)     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     Past Surgical History:   Procedure Laterality Date    BRONCHOSCOPY  12/2020    CESAREAN SECTION, LOW TRANSVERSE      COLOSTOMY      CRANIOTOMY      LUNG LOBECTOMY       Social History     Socioeconomic History    Marital status: Widowed   Tobacco Use    Smoking status: Never    Smokeless tobacco: Never   Vaping Use    Vaping Use: Never used   Substance and Sexual Activity    Alcohol use: Yes     Comment: glass of wine every other month    Drug use: Not Currently    Sexual activity: Not Currently   Social Activities of Daily Living Present    Military Service No    Blood Transfusions No    Caffeine Concern No    Occupational Exposure No    Hobby Hazards Yes     Comment: Gardening, MAC    Sleep Concern No    Stress Concern Yes     Comment: Husband death Cov19, 2020, sold home, rehomed pets. [place]    Weight Concern No    Special Diet Yes    Back Care No    Exercises Regularly Yes    Bike Helmet Use No    Seat Belt Use No    Performs Self-Exams No   Social History Narrative    Lives with brother Jeneen Rinks at home. Sister also lives in [place] History   Problem Relation Name Age of Onset    Cancer Mother Prescilla Sours ngs and throat    Heart Disease Mother Eliane Decree         Congrestive heart failure due to cancer    Hypertension Mother Eliane Decree     Psychiatry Mother Eliane Decree         Bi polar, manic depressive    Cancer Father Warren Danes         Prostate    Diabetes Father Warren Danes     Stroke Father Warren Danes     Stroke M Grandmother [place]     Cancer Brother Dian Situ         Skin (many locations)    Diabetes Brother Dian Situ          Review of Systems:  General:  Negative  Psychiatry: Negative  HEENT: Negative  Gastrointestinal: Negative  Respiratory: Negative  GU: Urinary retention no  Neuro: denies numbness, denies weakness; Perioral numbness, Dizziness, or Seizures: no  Musculoskeletal: Muscle pain  Remainder of a 12 point review of systems is negative      Labs:  Recent Labs     03/27/22  0635 03/28/22  0526   WBC 8.4 6.4   HGB 10.1* 9.6*   HCT 30.6* 29.3*   PLT 181 194       Recent Labs     03/27/22  0635 03/28/22  0526   NA 137 138   K 4.5 3.9   CL 105 105   BICARB 21* 24   BUN  10 9   CREAT 0.69 0.63   GLU 150* 147*   [place] 8.3* 8.3*   MG 1.6 1.7   PHOS 2.1* 2.1*       No results found for: "INR", "PTT"        Imaging:  No results found for this or any previous visit.      Labs and Imaging studies reviewed.     Physical Exam:  Vitals: BP (!) 105/49 (BP Location: Right arm, BP Patient Position: Semi-Fowlers)   Pulse 78   Temp 37.2 C   Resp 17   Ht 5\' 3"  (1.6 m)   Wt 67.9 kg (149 lb 12.8 oz)   SpO2 95%   BMI 26.54 kg/m   General: alert  Mental Status:  awake, oriented x 3. Speech is clear/ normal  Affect:  euthymic  Respiratory: Breathing comfortably, speaking in full sentences.  Head/Pupils: Unremarkable  Neuro: Sensory block: absent, Motor block: absent   Injection Site: Appears Clean, dry, and intact  Skin: intact, no lesions  Incision: Clean, dry, and intact  Abdominal: Appropriately tender to palpation      Assessment/Plan:  This is a 77 year old female POD 3 Days Post-Op  with Intrathecal morphine and Intravenous controlled patient analgesia for post-operative/acute pain control. Currently with excellent pain control. Given the patient's comorbidities and recent major surgery complicated by Inability to tolerate oral intake and Inability to ambulate post-operative/acute pain should continue to be treated with Intravenous controlled patient analgesia. Past medical history, labs and medications have been reviewed with patient. Relevant daily labs and  coagulation status have been reviewed for increased risk of epidural hematoma and adverse medication reactions. Patients hemodynamic status has been continuously evaluated throughout the last 24 hours and has not had episodes of hypotension.    Past medical history, labs and medications have been reviewed with patient. Relevant daily labs and coagulation status have been reviewed for increased risk of epidural hematoma and adverse medication reactions.    Pain/Neuro: Evaluation of neurologic exam showed no evidence of epidural hematoma.  - Neuraxial morphine resolved.  - Continue IV dilaudid PCA PRN breakthrough pain until diet advanced.  - Would benefit from Toradol 15mg  Q6H if able.  - Continue IV Tylenol, lidoderm patches.  - Encourage OOB to chair QID, ambulation.  CV: hemodynamically stable  Resp: Patients respiratory status has been intermittently evaluated by RN throughout the last 24 hours and has not had episodes of respiratory depression or apnea.   - Stable and is not showing signs of over sedation.   -  With PCA continue monitoring for respiratory depression with continuous end tidal CO2 monitoring  FEN/GI:  Diet NPO; No; Patient intolerant of oral intake   Renal: Patient is not having urinary retention   Heme/ID: afebrile, WBC wnl, platelets stable  PPx: Lovenox per primary team.     Will sign off at this time since pain controlled after ITM has resolved.    Patient counseled on the above recommendations. Plan discussed and coordinated with primary service.    Between 7-5pm, please page on-call Regional fellow/resident [phone] for questions about pain management, epidural, ketamine/lidocaine, intrathecal morphine, or nerve catheter issues.  After 5pm, please page on-call Regional fellow/resident [phone] for issues with epidurals, infusions, intrathecal morphine, or nerve catheters.  Please direct pain medication management questions to primary team.    Note Author: Morley Kos, MD    Medical Decision  Making  Today Reviewed CURES, Reviewed note(s) from surgical team, Reviewed  note(s) from nursing, Reviewed labs, Reviewed medications and allergies, Reviewed medication administration report, Reviewed last 24 hours of vital signs and intake and output, Discussed/messaged regarding case with surgical team, and Discussed/messaged regarding case with nurse. Reviewed note(s) from MIS, History obtained from patient and friend/family caregiver, and Discussed/messaged regarding case with provider MIS    Risks applicable to today's encounter and procedural risks relevant to the patient include:   Intrathecal drug management with monitoring for toxicity including oxymetry, sedation scales and end-tidal CO2, Intravenous controlled substances management with monitoring for toxicity including oxymetry, sedation scales and end-tidal CO2, Cardiovascular Risks, Pulmonary Risks, Neurological Risks, Risk of severe or worsening of pain in the setting of very recent major surgery or ongoing/evolving medical pathology, and Risk of severe or worsening of pain in the setting of other medical, psychological, social or economic risk factors

## 2022-03-28 NOTE — Plan of Care (Signed)
Problem: Promotion of Health and Safety  Goal: Promotion of Health and Safety  Description: The patient remains safe, receives appropriate treatment and achieves optimal outcomes (physically, psychosocially, and spiritually) within the limitations of the disease process by discharge.    Information below is the current care plan.  Outcome: Progressing  Flowsheets  Taken 03/28/2022 0406 by Natale Milch LE  Individualized Interventions/Recommendations #1:   Assessed for new signs of skin breakdown   Assisted pt in turning q2h   Offloaded heels on pillows  Individualized Interventions/Recommendations #2 (if applicable):   Assessed and managed abd pain with PCA dilaudid   Managed throat discomfort with PRN lozenges   clustered care to promote rest  Individualized Interventions/Recommendations #3 (if applicable): Pt and pt's sister endorsed hx and current diagnosis of type 2 diabetes, 1st call NP notified, insulin and glucose checks ordered  Individualized Interventions/Recommendations #4 (if applicable):   Implemented fall precautions   bed in lowest position, bed alarm on, call light within reach, non slip socks on  Individualized Interventions/Recommendations #5 (if applicable): Monitored I/Os from NG tube, JP drains, wound vac, ostomy, and foley  Outcome Evaluation (rationale for progressing/not progressing) every shift: Pt AAOx4, calm and cooperative, VSS, afebrile. Pt continues to require 0.5L O2 NC. Pt weaned off to RA, but O2 sat decreased to 90-91%. Pt endorsed abd pain well managed with PCA dilaudid. Throat pain managed effectively with PRN lozenges. JP drains continue to have minimal serosanguinous output. Ostomy with minimal liquid brown output. Foley maintained. NG tube maintained at low intermittent suction with brown, slight pink tinged output. No signs of infection around surgical incision sites and drain sites. Pt able to tolerate turns q2h with no new signs of skin breakdown. Fall precautions implemented  with no new falls. Plan of care ongoing.  Taken 03/27/2022 1930 by Natale Milch LE  Patient /Family stated Goal: to sleep quietly  Taken 03/27/2022 1517 by Marene Lenz, RN  Guidelines: Inpatient Nursing Guidelines  Note:

## 2022-03-28 NOTE — Progress Notes (Signed)
MIS Surgery Progress Note    Patient Name: Tiffany Chen MRN: [id number]    Hospital Day:   3 days - Admitted on: 03/25/2022  Post Operative Day(s): Day of surgery 3/18    HPI: 44F with asymptomatic GERD and history of RUL MAC requiring lobectomy as well as symtpomatic parastomal hernia. S/p repair of recurrent incisional/ventral hernia, repair of parastomal hernia with mobilization of the left rectus muscle w/ myofascial flap component separation w/ mesh, as well as ostomy takedown w/ creation of diverting loop ostomy w/ Dr. Hillery Hunter and Dr. Astrid Divine 3/18    Events/Subjective:  - NAOE, AFVSS  - NGT with 450 mL output  - Drains SS with RLQ 20 mL and LLQ 8mL  - Upset that she was not able to get OOB with therapy yesterday, would like to try to get up OOB to chair today  - Minimal gas and scant green output from ostomy    Vitals signs:  Latest entry:   Temperature: 98.9 F (37.2 C)  Heart Rate: 78  Respirations: 17  Blood pressure (BP): (!) 105/49  SpO2: 95 %  Weight: 67.9 kg (149 lb 12.8 oz)  Percentage Weight Change (%): 7.86 %    Temperature:  [98 F (36.7 C)-99 F (37.2 C)] 98.9 F (37.2 C) (03/21 0800)  Blood pressure (BP): (105-137)/(46-59) 105/49 (03/21 0800)  Heart Rate:  [66-92] 78 (03/21 0915)  Respirations:  [16-28] 17 (03/21 0915)  Pain Score: 2 (03/21 0800)  O2 Device: Nasal cannula (03/21 0800)  O2 Flow Rate (L/min):  [0.5 l/min] 0.5 l/min (03/21 0800)  SpO2:  [94 %-98 %] 95 % (03/21 0915)    Intake/Output                         03/27/22 0600 - 03/28/22 0559 03/28/22 0600 - 03/29/22 0559     [id number] 1800-0559 Total 0600-1759 [id number] Total                 Intake    I.V.  300  425 725  --  -- --    Total Intake 300 425 725 -- -- --       Output    Urine  500  500 1000  125  -- 125    Emesis/NG Output  150  300 450  --  -- --    Drains  40  42 82  --  -- --    Stool  --  6 6  --  -- --    Total Output [phone] 125 -- 125       Net I/O     -390 -423 -813 -125 -- -125            Patient  Lines/Drains/Airways Status       Active PICC Line / CVC Line / PIV Line / Drain / Airway / Intraosseous Line / Epidural Line / ART Line / Line Type / Wound / Pressure Ulcer Injury       Name Placement date Placement time Site Days    Peripheral IV - 20 G Right Antecubital 03/25/22  0602  Antecubital  3    Peripheral IV - 20 G Left Wrist 03/25/22  0741  Wrist  3    Closed/Suction Drain -  Lateral RLQ 03/25/22  1327  RLQ  2    Closed/Suction Drain -  Lateral LLQ 03/25/22  1327  LLQ  2    NG/OG  Tube -  Nasogastric 18 fr Right nostril 03/25/22  1237  Right nostril  2    Colostomy -  LLQ 03/20/21  --  LLQ  373    Ileostomy -  Loop ileostomy RLQ 03/25/22  1400  RLQ  2    Wound Vac -  Surgical Medial Abdomen 03/25/22  1344  -- 2    Incision -  03/25/22 0942 Abdomen Upper 03/25/22  0942  -- 3                    Diet Diet NPO; No; Patient intolerant of oral intake    No data recorded     Labs:  CBC  Recent Labs     03/25/22  1448 03/26/22  0627 03/27/22  0635 03/28/22  0526   WBC 14.9*   < > 8.4 6.4   HGB 13.1   < > 10.1* 9.6*   HCT 39.6   < > 30.6* 29.3*   PLT 259   < > 181 194   SEG 88  --   --   --    LYMPHS 4  --   --   --    MONOS 8  --   --   --     < > = values in this interval not displayed.       Chemistry  Recent Labs     03/27/22  0635 03/28/22  0526   NA 137 138   K 4.5 3.9   CL 105 105   BICARB 21* 24   BUN 10 9   CREAT 0.69 0.63   GLU 150* 147*   [place] 8.3* 8.3*   MG 1.6 1.7   PHOS 2.1* 2.1*       No results for input(s): "ALK", "AST", "ALT", "TBILI", "DBILI", "ALB" in the last 72 hours.    Coags  No results for input(s): "PT", "PTT", "INR" in the last 72 hours.    Radiology:   X-Ray Abdomen Post Tube Placement    Result Date: 03/26/2022  IMPRESSION: 1. Suction-type enteric tube, coursing below the diaphragm, with side-port projecting over the at or just beyond the GE junction , with tip projecting over the gastric fundus. 2. Partially imaged surgical drain projects over the left lower quadrant of the abdomen. 3.  Nonobstructive bowel gas pattern.      Microbiology:  Microbiology Results (last 30 days)       Procedure Component Value - Date/Time    Methicillin Resistant Staphylococcus aureus (MRSA), Surveillance, Nasal Angela Burke Nares [id number] Collected: 03/25/22 2103    Lab Status: Final result Specimen: Nares Updated: 03/27/22 1259     MRSA Surveillance Result No Methicillin Resistant Staphylococcus aureus isolated.              Physical Exam:  General Appearance: healthy, alert, no distress, pleasant affect, cooperative, cooperative.  Heart:  normal rate and regular rhythm, no murmurs, clicks, or gallops.  Lungs: clear to auscultation and percussion, no chest deformities noted and Equal chest rise bilaterally.  L abdominal drain w/ serosanguinous drainage  R abdominal drain w/ serosanguinous fluid. Midline incision w/ incisional WV, incision c/d/I with no strikethrough. Abdomen appropriately tender for post post-operative course. Old LUQ ostomy site w/ prevena vac to seal, R ileostomy pouched, & pink,bowel sweat  Foley catheter w/ yellow urine   Extremities:  no cyanosis, clubbing, or edema.    Medications:  IV Meds   dextrose-sodium chloride 5%-0.9% 50 mL/hr at  03/28/22 0514    HYDROmorphone       Scheduled Meds   acetaminophen  1,000 mg Q8H NR    Amikacin Sulfate Liposome  590 mg Q24H    atorvastatin  10 mg Daily    azithromycin  250 mg Daily    cholecalciferol  5,000 Units Daily    CLOFAZIMINE IRB 21-0063 50 MG CAPSULE 100 mg  100 mg Daily    enoxaparin  40 mg Daily    ethambutol  800 mg Daily    ferrous sulfate  324 mg Daily    fluoxetine  40 mg Daily    insulin glargine  0.1 Units/kg Daily at Noon    insulin regular  0-10 Units Q6H    lidocaine  2 patch Q24H    loratadine  10 mg Daily    magnesium sulfate  2 g Once    pantoprazole  40 mg Daily    rifAMPin  600 mg Daily    salmeterol  1 puff Q12H    sodium PHOSphate  10 mEq Q2H     PRN Meds   albuterol  2 puff Q6H PRN    dextrose  12.5 g PRN    diphenhydrAMINE  25  mg Q4H PRN    glucagon  1 mg Once PRN    glucose  4 tablet PRN    glucose  1 Tube PRN    HYDROmorphone  0.4 mg Q4H PRN    menthol  1 lozenge Q2H PRN    nalOXone  0.1 mg Q2 Min PRN    phenol  1 spray Q2H PRN    sodium chloride  4 mL Q12H PRN         ASSESSMENT / PLAN:    [name] with history of GERD, parastomal hernia, 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 Hillery Hunter and Dr. Astrid Divine 3/18.     Recovering appropriately.     Neuro: Multi-modal pain control   Tylenol 1050m IV scheduled q8  Lidocaine patches q12 prn  Dilaudid  PCA  Benadryl 25mg  IV q4 prn for itching    CV: Atorvastatin  Pulmonary: out of bed, incentive spirometry, maintaining saturations on room air  Encourage IS every hour while awake    #MAC lung disease  Continue home Arikayce nebulizer   Azithromycin 250mg  PO daily  Clofazimine 100mg  BID  Ethambutol 800mg  daily    Rifampin 600mg  daily  Salmeterol 1 puff q12  Albuterol 2 puff q6 prn    GI: Maintain NGT to LIS. Flush NGT with 20 mL once per shift to maintain patency.   XR today and will consider gravity trail. Plan to dc NGT if passes trial  Continue mIVF. NPO until having ROBF.  Continue PPI      FEN:  mIVF. Monitor and Replete electrolytes as needed.    GU/Renal: Strict In's and Out's.  #Hyperkalemia secondary to low volume depletion  K 5.7 EKG wnl, no abnormalities. S/p 1g calcium gluconate. 1L bolus  K and Cr now stable  DC foley catheter today once OOB  Replete lytes PRN    ID: Afebrile. WBC 6.4    Hematology: Hgb 9.6  Will hold lovenox today until Hbg stable  Drain to bulb suction, monitor output    Prophylaxis: Pantoprazole IV. Prophylatic Lovenox sub-cutaneous, SCDs  while in bed.   PT/OT: Ambulate with assist QID; OOB    The patient was  examined and discussed with chief resident and team and seen with  Dr. Astrid Divine , who agrees with the above assessment and plan.     Sindy Guadeloupe,  NP  Surgery service [phone]

## 2022-03-28 NOTE — Interdisciplinary (Addendum)
Occupational Therapy Evaluation    Admitting Physician:  Diona Browner, *  Admission Date 03/25/2022    Inpatient Diagnosis:   Problem List         Codes    Impaired mobility and ADLs    -  Primary ICD-10-CM: Z74.09, Z78.9  ICD-9-CM: V49.89            IP Start of Service  Start of Care: 03/28/22  Onset Date: 03/25/2022    Preferred Language:English         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      Past Surgical History:   Procedure Laterality Date    BRONCHOSCOPY  12/2020    CESAREAN SECTION, LOW TRANSVERSE      COLOSTOMY      CRANIOTOMY      LUNG LOBECTOMY          OT Acute       Row Name 03/28/22 1500          Type of Visit    Type of Occupational Therapy note Occupational Therapy Evaluation       Row Name 03/28/22 1500          Treatment Time    Treatment Start Time 0910     Total TIMED Treatment (min) --  53     Total Treatment Time (min) 68       Row Name 03/28/22 1500          Treatment Precautions/Restrictions    Precautions/Restrictions Fall;Multiple lines;Other  (comments)     Fall Bed/chair alarm;Socks/charm     Other Precautions/Restrictions Information wound vac, NG to suction, ileostomy, drains x2, PCA       Row Name 03/28/22 1500          Medical History    History of presenting condition Per chart: "67F PMH asymptomatic GERD and history of RUL MAC requiring lobectomy as well as symtpomatic parastomal hernia. S/p repair of recurrent incisional/ventral hernia, repair of parastomal hernia with mobilization of the left rectus muscle w/ myofascial flap component separation w/ mesh, as well as ostomy takedown w/ creation of diverting loop illeostomy 3/18"     Fall history No falls reported in the last 6 months     Dominant Side Right       Row Name 03/28/22 1500          Functional History    Prior Level of Function No deficits     General ADL/Self-Care Assistance Needs Independent with ADLs and self care using adaptive device/equipment     Self Care Equipment/Device(s) in  the home Shower chair/bench     Equipment required for mobility in the home None       Row Name 03/28/22 1500          Social History    Living Situation Lives alone     Home Environment Other (Comment)  "granny flat" below sister's home     Home accessibility Performs activities of daily living (ADL's) on one level;Stairs present     Number of steps to enter home 5     Number of steps within home 0     [place];Shower/tub seat     Other Social History Information sister is retired and available to assist if/as needed       [place] Name 03/28/22 1500          Subjective    Subjective information "There hasn't been that much pain"     Patient status Patient agreeable to treatment;Nursing in agreement for treatment;Patient pain control adequate to participate in therapy       Row Name 03/28/22 1500          Pain Assessment    Pain Asssessment Tool Numeric Pain Rating Scale       Row Name 03/28/22 1500          Numeric Pain Rating Scale    Pain Intensity - rating at present 2     Pain  Intensity- rating after treatment 2     Location abdomen       Row Name 03/28/22 1500          [place] AM-PAC: Daily Activity    Assistance Needed to Put on and Take off Regular Lower Body Clothing 2     Assistance Needed to Bathe, Including Washing, Rinsing, and Drying 2     Assistance Needed to Toilet Environmental manager, Bedpan, or Urinal) 2     Assistance Needed to Put on and Take off Regular Upper Body Clothing 3     Assistance Needed to Take Care of Personal Grooming Such as Brushing Teeth 3     Assistance Needed to Eat Meals 3  NPO     AM-PAC Daily Activity Total Score 15     AMP-PAC Daily Activity Impairment rating Score 14-19 - 40-59% impaired       Row Name 03/28/22 1500          Objective    Overall Cognitive Status Intact - no cognitive limitations or impairments noted     Communication No communication limitations or impairments noted. Current status of hearing, speech and vision allow functional communication.     Coordination/Motor control No limitations or impairments noted. Movement patterns are fluid and coordinated throughout     Balance Balance limitations present     Static Sitting Balance Good - able to maintain balance without handhold support, limited postural sway     Dynamic Sitting Balance Good - accepts moderate challenge, able to maintain balance while picking object off floor     Static Standing Balance Fair - able to maintain balance with handhold support, may require occasional minimal assistance     Dynamic Standing Balance Fair - accepts minimal challenge, able to maintain balance while turning head/trunk     Extremity Assessment Flexibility, strength, muscle tone and sensation grossly within functional limits throughout  BUE strength/ROM grossly WFLl pt stated no change from baseline. (pmHx includes x2 separate events of frozen shoulder)     Other Objective Findings ADLs/MRADLs    Oral hygiene/Grooming: pt  able to brush teeth and wash face while seated on BSC (pt requested to complete on BSC  rather than transferring to regular chair/EOB) with set-up/gathering/clean-up assistance.     UB Dressing/LB Dressing:  not assessed this date.     Footwear: totalA provided to don socks, bed-level, in preparation for EOB/OOB activity; pt cited difficulty/discomfort with reaching/bending as barrier to task completion.     Toileting: pt reported urge/sensation to void bladder; pt assisted to [facility], though ultimately pt unable to void at this time.      Toilet Transfer: minA to complete stand pivot transfer from EOB<>BSC, no device (though HHA). Slightly unsteady.    Lying<>Sitting EOB: minA; HOB elevated, bed features utilized as needed. Edu/instruction/facilitation provided re: technique for minimized discomfort.     Sit-to-stand: minA, EOB<>stance (HHA; no device).     Misc: additional time spent establishing rapport, managing lines pre/post tx session, and coordinating therapy plan.                       OT Acute Tool Box       Row Name 03/28/22 1500          Cognition Assessment    Overall Cognitive Status Intact - no cognitive limitations or impairments noted                        Eval cont.       [place] Name 03/28/22 1500          Patient/Family Education    Learner(s) Patient     Learner response to rehab patient education interventions Verbalizes understanding     Patient/family training comments Role of IP OT       [place] Name 03/28/22 1500          Assessment    Assessment Pt is currently performing below functional baseline for completion of self-care tasks and functional mobility. Pt is limited by abdominal discomfort, balance deficits, strength deficits, and activity tolerance deficits which is impacting participation, performance, and safety during ADLs. Pt will continue to benefit from skilled inpatient occupational therapy services to further address functional deficits in order to maximize safety and independence.     POST ACUTE REHABILITATION DISCHARGE RECOMMENDATIONS:    Recommended Post-Acute Therapy  Follow up:   Ongoing Skilled Therapy after Discharge    Pt with good potential for discharge to home with  family available if needed for intermittent assistance with ADLs + home health.     If available, recommend discharge to a setting with the following assistance for the listed deficits:     Deficits :   All mobility related ADLs assist level: Minimum (25% or less assistance)  Toileting assist level: Moderate (25%-50% assistance)      Equipment recommendations:   To be determined as patient progresses in therapy  Bedside Commode (potentially; TBD)         Rehab Potential Excellent       Row Name 03/28/22 1500          Patient stated Goal    Patient stated goal Use the bathroom (voiding; BSC)       Row Name 03/28/22 1500          Goal 1 (Short Term)    Impairment Activities of Daily Living - Lower Body Dressing     Activities of Daily Living - Lower Body Dressing Patient able to perform and complete lower body dressing at modified independent/independent  level  AE if/as indicated     Number of visits 4-6     Goal Status New       Row Name 03/28/22 1500          Goal 2 (Short Term)    Impairment Functional ability/mobility - Toileting     Functional ability/mobility - Toileting Patient demonstrates the ability to perform and complete toilet transfers at modified independent/independent level     Number of visits 4-6     Goal Status [place] Name 03/28/22 1500          GOAL (Long Term)    Long Term Goal Impairment Functional ability/mobility - Toileting     Functional ability/mobility - Toileting Patient demonstrates the ability to perform and complete toilet transfers at modified independent/independent level     Long Term Goal Number of visits [place] Name 03/28/22 1500          Planned Therapy Interventions and Rationale    Neuromuscular Re-Education to improve safety during dynamic activities     Patient Education to increase safety during dynamic activities;to improve  energy conservation measures during functional activities;to increase independence in functional activities;to increase knowledge of purpose of home exercise program     Self-Care/ADL Training to improve independence with adaptive equipment;to improve independence with compensatory strategies;to improve energy conservation measures during functional activities;to improve home safety;to improve safety when completing daily activities and self care;to improve safety while using an assistive device     Therapeutic Activities to improve ability to perform self care and ADL's;to improve transfers between surfaces     Therapeutic Exercise to improve activity tolerance to allow greater independence with self care and ADL's  ROM/Strength functional maintenance       Row Name 03/28/22 1500          Treatment Plan Disussion    Treatment Plan Discussion and Agreement Patient/family/caregiver stated understanding and agreement with the therapy plan;Patient support system determined and all questions were asked and answered       [place] Name 03/28/22 1500          Treatment Plan    Continue therapy to address Activity tolerance limitation;Decline in functional ability/mobility;Decline in performance of activities of daily living (ADL)     Frequency of treatment 5 times per week     Duration of treatment (number of visits) Treatment will continue while in hospital and in need of skilled therapy services     Status of treatment Patient evaluated and will benefit from ongoing skilled therapy       Row Name 03/28/22 1500          Patient Safety Considerations     Patient safety considerations Patient returned to bed at end of treatment;Call light left in reach and fall precautions in place;Patient may be at risk for falls     Patient assistive device requirements for safe ambulation [place] Name 03/28/22 1500          Patient Mobilization Recommendations (as tolerated)     Toileting Patient to use bedside commode     Out of  bed Patient out of bed to chair for all meals     Device  Walker     Safe patient handling equipment  None needed       [place] Name 03/28/22 1500  Therapy Plan Communication    Therapy Plan Communication Discussed therapy plan with Nursing and/or Physician       [place] Name 03/28/22 1500          Occupational Therapy Patient Discharge Instructions    Your Occupational Therapist suggests the following Continue to complete your self care Activities of Daily Living as frequently as possible       Row Name 03/28/22 1500          Type of Eval    Low Complexity [phone]) Completed       Row Name 03/28/22 1500          Therapeutic Procedures    Therapeutic Activities ([id number]) Assistance/facilitation of bed mobility;Patient education;Progressive mobilization to improve functional independence;Transfer training with weight shift and direction change;Weight shift activities to improve safety in unsupported sitting or standing         Total TIMED Treatment (min) 23     Self-Care/ADL Training [phone]) Dressing;Grooming;Patient education;Personal hygiene;Adaptive equipment instruction         Total TIMED Treatment (min) 30                     The occupational therapist of record is endorsed by evaluating occupational therapist.

## 2022-03-28 NOTE — Interdisciplinary (Signed)
Physical Therapy Evaluation    Admitting Physician:  Diona Browner, *  Admission Date 03/25/2022    Inpatient Diagnosis:   Problem List         Codes    Impaired mobility and ADLs    -  Primary ICD-10-CM: Z74.09, Z78.9  ICD-9-CM: V49.89    Impaired mobility     ICD-10-CM: Z74.09  ICD-9-CM: 799.89            IP Start of Service   Start of Care: 03/28/22  Reason for referral: Activity tolerance limitation;Decline in functional ability/mobility;Range of motion/strength limitations    Preferred Language:English         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      Past Surgical History:   Procedure Laterality Date    BRONCHOSCOPY  12/2020    CESAREAN SECTION, LOW TRANSVERSE      COLOSTOMY      CRANIOTOMY      LUNG LOBECTOMY          PT Acute       Row Name 03/28/22 1600          Type of Visit    Type of Physical Therapy note Physical Therapy Evaluation       Row Name 03/28/22 1600          Treatment Precautions/Restrictions    Precautions/Restrictions Fall;Multiple  lines;Postsurgical/procedural     Other Precautions/Restrictions Information NGT to continuous wall suction, PCA, PIVs, JP drain x2, illeostomy       Row Name 03/28/22 1600          Medical History    History of presenting condition Per chart, patient is a 64F with asymptomatic GERD and history of RUL MAC requiring lobectomy as well as symtpomatic parastomal hernia. S/p repair of recurrent incisional/ventral hernia, repair of parastomal hernia with mobilization of the left rectus muscle w/ myofascial flap component separation w/ mesh, as well as ostomy takedown w/ creation of diverting loop ostomy w/ Dr. Hillery Hunter and Dr. Astrid Divine 3/18     Fall history No falls reported in the last 6 months;Discussed fall prevention education with patient       [place] Name 03/28/22 1600          Functional History    Prior Level of Function No deficits     Equipment required for mobility in the home None       Row Name 03/28/22 [place] Other (Comment)  [place]  accessibility  Stairs present     Number of steps to enter home 5     Other Social History Information Sister lives in home above patient. Patient reports sister is retired and available to provide assist as needed.       [place] Name 03/28/22 1600          Subjective    Patient status Patient agreeable to treatment;Nursing in agreement for treatment;Patient pain control adequate to participate in therapy       [employer] Name 03/28/22 1600          Pain Assessment    Pain Asssessment Tool Nonverbal Pain Scale     Other Pain Assessment Information Reports abdominal pain. Using PCA pump.       [place] Name 03/28/22 1600          Non Verbal (CNPI)    Activity- Vocal Complaints: nonverbal 0     Activity- Facial Grimaces/Winces 0     Activity- Bracing 0     Activity- Restlessness 0     Activity- Rubbing 0     Activity- Vocal complaints: verbal 1     Total Nonverbal Behaviors Present with Activity 1     Rest-Vocal Complaints: nonverbal 0      Facial Grimaces/Winces 0     Rest-Bracing 0     Rest-Restlessness 0     Rest-Rubbing 0     Rest-Vocal complaints: verbal 0     Total Nonverbal Behaviors Present at Rest 0     Total Score 1       Row Name 03/28/22 1600          Objective    Overall Cognitive Status Intact - no cognitive limitations or impairments noted     Communication No communication limitations or impairments noted. Current status of hearing, speech and vision allow functional communication.     Coordination/Motor control No limitations or impairments noted. Movement patterns are fluid and coordinated throughout     Balance Balance limitations present     Static Sitting Balance Good - able to maintain balance without handhold support, limited postural sway     Dynamic Sitting Balance Good - accepts moderate challenge, able to maintain balance while picking object off floor     Static Standing Balance Good - able to maintain balance without handhold support, limited postural sway     Dynamic Standing Balance Fair - accepts minimal challenge, able to maintain balance while turning head/trunk     Extremity Assessment Range of motion, strength,  muscle tone and/or sensation limitations present     LUE findings pls refer to OT note     RUE findings pls refer to OT note     LLE findings strength 5/5, AROM WNL     RLE findings strength 5/5, AROM WNL     Other  Extremity Assessment  Information sensation intact to light touch     Functional Mobility Functional mobility deficits present     Bed Mobility Minimum assistance (25% assistance)     Bed Mobility Comments Sit to supine: minA, log roll, using hospital bed features.     Transfers to/from Stand Minimum assistance (25% assistance)     Transfer Comments Stand pivot transfer BSC to bed     Device used for ambulation/mobility None     Other Objective Findings Patient in bed at end of session. All lines intact, needs met, vitals stable. Hand-off to RN.  Eval cont.       [place] Name  03/28/22 1600          [place] AM-PAC: Basic Mobility    Assistance Needed to Turn from Back to Side While in a Flat Bed Without Using Bedrails 4 - None (independent)     Difficulty with Supine to Sit Transfer 3 - A little (supervised/min assist)     How Much Help Needed to Move to/from Bed to Chair 3 - A little (supervised/min assist)     Difficulty with Sit to Stand Transfer from Chair with Arms 3 - A little (supervised/min assist)     How Much Help Needed to Walk in Room 3 - A little (supervised/min assist)     How Much Help Needed to Climb 3-5 Steps with a Rail 3 - A little (supervised/min assist)     AMPAC Total Score 19     Assessment: AM-PAC Basic Mobility Impairment Rating Score 19-22 - 20-39% impaired       Row Name 03/28/22 1600          Patient/Family Education    Learner(s) Patient     Learner response to rehab patient education interventions Verbalizes understanding;Able to return demonstrate teaching     Patient/family training comments Role of PT, POC, safety and activity recs.       [place] Name 03/28/22 1600          Assessment    Assessment Pt presents with deficits in Activity tolerance affecting functional mobility. Pt is functioning below their reported baseline, currently requires one person assist for safe bed mobility and transfers, and will benefit from ongoing skilled PT to maximize functional independence and reach stated functional goals.     Hospital mobility recommendations for nursing/staff: OOB to chair for meals with one person assist    POST ACUTE REHABILITATION DISCHARGE RECOMMENDATIONS:    Recommended Post-Acute Therapy Follow up:   Continued skilled PT and assist for mobility. Patient with good potential for discharge home with assist from family.     If available, recommend discharge to a setting with the following assistance for the listed deficits:     Deficits :   Bed mobility: assist level: Minimum (25% or less assistance)  Transfers: assist level: Minimum (25% or less  assistance)    Equipment recommendations:   To be determined as patient progresses in therapy         Rehab Potential Excellent       Row Name 03/28/22 1600          Patient stated Goal    Patient stated goal none specified today       [place] Name 03/28/22 1600          Goal 1 (Short Term)    Impairment Gait impairment     Gait Patient able to consistently ambulate household distances with least restrictive assistive device with no more than supervision assistance     Number of visits 5     Goal Status New       Row Name 03/28/22 1600          GOAL (Long Term)    Long Term Goal Impairment Gait impairment     Long Term Custom Goal To promote mobility within the home and community, patient is able to to navigate steps with unilateral support and/or least restrictive assistive device as needed     Long Term Goal Number of visits 7     Long Term Goal Status  New       Row Name 03/28/22 1600          Planned Therapy Interventions and Rationale    Gait Training to normalize gait pattern and improve safety while ambulating;to normalize gait pattern and improve safety while ambulating with assistive device;to improve safety with stair navigation     Therapeutic Activities to improve functional mobility and ability to navigate in the home and/or community;to improve transfers between surfaces     Theraputic Exercise to improve activity tolerance to allow greater independence with functional mobility skills       Row Name 03/28/22 1600          Treatment Plan Disussion    Treatment Plan Discussion and Agreement Patient/family/caregiver stated understanding and agreement with the therapy plan       [place] Name 03/28/22 1600          Treatment Plan    Continue therapy to address Activity tolerance limitation;Decline in functional ability/mobility     Frequency of treatment 5 times per week     Duration of treatment (number of visits) While patient is hospitalized and in need of skilled therapy services     Status of treatment Patient  evaluated and will benefit from ongoing skilled therapy       Row Name 03/28/22 1600          Patient Safety Considerations     Patient safety considerations Patient returned to bed at end of treatment;Call light left in reach and fall precautions in place;Nursing notified of safety considerations at end of treatment       [place] Name 03/28/22 [place] Communication Discussed therapy plan with Nursing and/or Physician;Encouraged out of bed with assistance by     Encouraged out of bed with assistance by Nursing;Staff       [place] Name 03/28/22 1600          Physical Therapy Patient Discharge Instructions    Your Physical Therapist suggests the following Continue to use correct body mechanics when moving in and out of bed as instructed       [place] Name 03/28/22 1600          Type of Eval    Low Complexity ([id number]) Completed       Row Name 03/28/22 1600          Therapeutic Procedures    Therapeutic Activities ([id number])  Assistance/facilitation of bed mobility;Functional activities;Patient education;Progressive mobilization to improve functional independence;Transfer training with weight shift and direction change;Weight shift activities to improve safety in unsupported sitting or standing        Total TIMED Treatment (min)  15       Row Name 03/28/22 1600          Treatment Time     Total TIMED Treatment  (min) 15     Total Treatment Time (min) 30     Treatment start time 0945                        The physical therapist of record is endorsed by evaluating physical therapist.

## 2022-03-29 ENCOUNTER — Inpatient Hospital Stay (HOSPITAL_BASED_OUTPATIENT_CLINIC_OR_DEPARTMENT_OTHER): Payer: Medicare Other

## 2022-03-29 DIAGNOSIS — R140 Abdominal distension (gaseous): Secondary | ICD-10-CM

## 2022-03-29 LAB — GLUCOSE (POCT)
Glucose (POCT): 143 mg/dL — ABNORMAL HIGH (ref 70–99)
Glucose (POCT): 149 mg/dL — ABNORMAL HIGH (ref 70–99)
Glucose (POCT): 150 mg/dL — ABNORMAL HIGH (ref 70–99)
Glucose (POCT): 151 mg/dL — ABNORMAL HIGH (ref 70–99)

## 2022-03-29 LAB — HEMOGRAM, BLOOD
Hct: 27.7 % — ABNORMAL LOW (ref 34.0–45.0)
Hgb: 9.3 gm/dL — ABNORMAL LOW (ref 11.2–15.7)
MCH: 31.4 pg (ref 26.0–32.0)
MCHC: 33.6 g/dL (ref 32.0–36.0)
MCV: 93.6 um3 (ref 79.0–95.0)
MPV: 9.6 fL (ref 9.4–12.4)
Plt Count: 215 10*3/uL (ref 140–370)
RBC: 2.96 10*6/uL — ABNORMAL LOW (ref 3.90–5.20)
RDW: 12.6 % (ref 12.0–14.0)
WBC: 4.6 10*3/uL (ref 4.0–10.0)

## 2022-03-29 LAB — BASIC METABOLIC PANEL, BLOOD
Anion Gap: 11 mmol/L (ref 7–15)
BUN: 10 mg/dL (ref 8–23)
Bicarbonate: 22 mmol/L (ref 22–29)
Calcium: 8.1 mg/dL — ABNORMAL LOW (ref 8.5–10.6)
Chloride: 106 mmol/L (ref 98–107)
Creatinine: 0.59 mg/dL (ref 0.51–0.95)
Glucose: 146 mg/dL — ABNORMAL HIGH (ref 70–99)
Potassium: 3.4 mmol/L — ABNORMAL LOW (ref 3.5–5.1)
Sodium: 139 mmol/L (ref 136–145)
eGFR Based on CKD-EPI 2021 Equation: >60 mL/min/{1.73_m2}

## 2022-03-29 LAB — MAGNESIUM, BLOOD: Magnesium: 1.9 mg/dL (ref 1.6–2.4)

## 2022-03-29 LAB — PHOSPHORUS, BLOOD: Phosphorous: 2.5 mg/dL — ABNORMAL LOW (ref 2.7–4.5)

## 2022-03-29 MED ORDER — POTASSIUM PHOSPHATE 10 MEQ/100 ML NS (~~LOC~~)
10.0000 meq | Status: AC
Start: 2022-03-29 — End: 2022-03-29
  Administered 2022-03-29 (×3): 10 meq via INTRAVENOUS
  Filled 2022-03-29 (×3): qty 100

## 2022-03-29 MED ORDER — POTASSIUM CHLORIDE 10 MEQ/100ML IV SOLN
10.0000 meq | INTRAVENOUS | Status: AC
Start: 2022-03-29 — End: 2022-03-29
  Administered 2022-03-29 (×2): 10 meq via INTRAVENOUS
  Filled 2022-03-29 (×2): qty 100

## 2022-03-29 MED ORDER — MAGNESIUM SULFATE 2 GM/50ML IV SOLN
2.0000 g | Freq: Once | INTRAVENOUS | Status: AC
Start: 2022-03-29 — End: 2022-03-29
  Administered 2022-03-29: 2 g via INTRAVENOUS
  Filled 2022-03-29: qty 50

## 2022-03-29 NOTE — Progress Notes (Shared)
MIS Surgery Progress Note    Patient Name: Tiffany Chen MRN: [id number]    Hospital Day:   4 days - Admitted on: 03/25/2022  Post Operative Day(s): Day of surgery 3/18    HPI: 65F with asymptomatic GERD and history of RUL MAC requiring lobectomy as well as symtpomatic parastomal hernia. S/p repair of recurrent incisional/ventral hernia, repair of parastomal hernia with mobilization of the left rectus muscle w/ myofascial flap component separation w/ mesh, as well as ostomy takedown w/ creation of diverting loop ostomy w/ Dr. Hillery Hunter and Dr. Astrid Divine 3/18    Events/Subjective:  - NAOE, AFVSS  - NG tube out   - Denies nausea, vomiting, fevers, chills, AFVSS  - Was unable to ambulate yesterday   -Remains with minimal output from iliostomy     Vitals signs:  Latest entry:   Temperature: 98.4 F (36.9 C)  Heart Rate: 74  Respirations: 18  Blood pressure (BP): (!) 117/54  SpO2: 93 %  Weight: 67.9 kg (149 lb 12.8 oz)  Percentage Weight Change (%): 7.86 %    Temperature:  [98.2 F (36.8 C)-99 F (37.2 C)] 98.4 F (36.9 C) (03/22 0711)  Blood pressure (BP): (109-137)/(51-60) 117/54 (03/22 0711)  Heart Rate:  [66-83] 74 (03/22 0711)  Respirations:  [17-22] 18 (03/22 0711)  Pain Score: 0 (03/22 0713)  O2 Device: None (Room air) (03/21 2000)  O2 Flow Rate (L/min):  [0 l/min] 0 l/min (03/21 1600)  SpO2:  [90 %-99 %] 93 % (03/22 0711)    Intake/Output                         03/28/22 0600 - 03/29/22 0559 03/29/22 0600 - 03/30/22 0559     [id number] 1800-0559 Total 0600-1759 1800-0559 Total                 Intake    I.V.  --  1455 1455  --  -- --    Total Intake -- 1455 1455 -- -- --       Output    Urine  275  500 775  100  -- 100    Emesis/NG Output  100  -- 100  --  -- --    Drains  20  20 40  --  -- --    Stool  --  0 0  --  -- --    Total Output 395 520 915 100 -- 100       Net I/O     -395 935 540 -100 -- -100            Patient Lines/Drains/Airways Status       Active PICC Line / CVC Line / PIV Line / Drain /  Airway / Intraosseous Line / Epidural Line / ART Line / Line Type / Wound / Pressure Ulcer Injury       Name Placement date Placement time Site Days    Peripheral IV - 20 G Right Antecubital 03/25/22  0602  Antecubital  4    Peripheral IV - 20 G Left Wrist 03/25/22  0741  Wrist  4    Closed/Suction Drain -  Lateral RLQ 03/25/22  1327  RLQ  3    Closed/Suction Drain -  Lateral LLQ 03/25/22  1327  LLQ  3    Colostomy -  LLQ 03/20/21  --  LLQ  374    Ileostomy -  Loop ileostomy RLQ 03/25/22  1400  RLQ  3    Wound Vac -  Surgical Medial Abdomen 03/25/22  1344  -- 3    Incision -  03/25/22 0942 Abdomen Upper 03/25/22  0942  -- 3                    Diet Diet NPO; No; Patient intolerant of oral intake    No data recorded     Labs:  CBC  Recent Labs     03/28/22  0526 03/29/22  0647   WBC 6.4 4.6   HGB 9.6* 9.3*   HCT 29.3* 27.7*   PLT 194 215         Chemistry  Recent Labs     03/28/22  0526 03/29/22  0647   NA 138 139   K 3.9 3.4*   CL 105 106   BICARB 24 22   BUN 9 10   CREAT 0.63 0.59   GLU 147* 146*   [place] 8.3* 8.1*   MG 1.7 1.9   PHOS 2.1* 2.5*         No results for input(s): "ALK", "AST", "ALT", "TBILI", "DBILI", "ALB" in the last 72 hours.    Coags  No results for input(s): "PT", "PTT", "INR" in the last 72 hours.    Radiology:   X-Ray Abdomen Post Tube Placement    Result Date: 03/26/2022  IMPRESSION: 1. Suction-type enteric tube, coursing below the diaphragm, with side-port projecting over the at or just beyond the GE junction , with tip projecting over the gastric fundus. 2. Partially imaged surgical drain projects over the left lower quadrant of the abdomen. 3. Nonobstructive bowel gas pattern.      Microbiology:  Microbiology Results (last 30 days)       Procedure Component Value - Date/Time    Methicillin Resistant Staphylococcus aureus (MRSA), Surveillance, Nasal Angela [place] Nares [id number] Collected: 03/25/22 2103    Lab Status: Final result Specimen: Nares Updated: 03/27/22 1259     MRSA Surveillance Result  No Methicillin Resistant Staphylococcus aureus isolated.              Physical Exam:  General Appearance: healthy, alert, no distress, pleasant affect, cooperative, cooperative.  Heart:  normal rate and regular rhythm, no murmurs, clicks, or gallops.  Lungs: clear to auscultation and percussion, no chest deformities noted and Equal chest rise bilaterally.  L abdominal drain w/ serosanguinous drainage  R abdominal drain w/ serosanguinous fluid. Midline incision w/ incisional WV (to suction), midline incision c/d/I with no strikethrough. Abdomen appropriately tender . Old LUQ ostomy site w/ prevena vac to seal, R ileostomy pouched & pink, red rubber present.   Foley catheter w/ yellow urine   Extremities:  no cyanosis, clubbing, or edema.    Medications:  IV Meds   dextrose-sodium chloride 5%-0.9% 75 mL/hr at 03/29/22 0548    HYDROmorphone       Scheduled Meds   Amikacin Sulfate Liposome  590 mg Q24H    atorvastatin  10 mg Daily    azithromycin  250 mg Daily    cholecalciferol  5,000 Units Daily    CLOFAZIMINE IRB 21-0063 50 MG CAPSULE 100 mg  100 mg Daily    [Manual MAR Hold] enoxaparin  40 mg Daily    ethambutol  800 mg Daily    ferrous sulfate  324 mg Daily    fluoxetine  40 mg Daily    insulin glargine  0.1 Units/kg Daily at Noon    insulin  regular  0-10 Units Q6H    lidocaine  2 patch Q24H    loratadine  10 mg Daily    magnesium sulfate  2 g Once    pantoprazole  40 mg Daily    potassium chloride  10 mEq Q1H    potassium PHOSphate IV  10 mEq Q2H    rifAMPin  600 mg Daily    salmeterol  1 puff Q12H    tiotropium  1 capsule Daily     PRN Meds   albuterol  2 puff Q6H PRN    dextrose  12.5 g PRN    diphenhydrAMINE  25 mg Q4H PRN    glucagon  1 mg Once PRN    glucose  4 tablet PRN    glucose  1 Tube PRN    HYDROmorphone  0.4 mg Q4H PRN    menthol  1 lozenge Q2H PRN    nalOXone  0.1 mg Q2 Min PRN    phenol  1 spray Q2H PRN    sodium chloride  4 mL Q12H PRN         ASSESSMENT / PLAN:    [name] with history  of GERD, parastomal hernia, 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 Hillery Hunter and Dr. Astrid Divine 3/18.     Recovering appropriately.     Neuro: Multi-modal pain control   Tylenol 1043m IV scheduled q8  Lidocaine patches q12 prn  Dilaudid  PCA  Benadryl 25mg  IV q4 prn for itching    CV: Atorvastatin  Pulmonary: out of bed, incentive spirometry, maintaining saturations on room air  Encourage IS every hour while awake    #MAC lung disease  Continue home Ari[place]ce nebulizer   Azithromycin 250mg  PO daily  Clofazimine 100mg  BID  Ethambutol 800mg  daily    Rifampin 600mg  daily  Salmeterol 1 puff q12  Albuterol 2 puff q6 prn    GI: Maintain NGT to LIS. Flush NGT with 20 mL once per shift to maintain patency.   S/p NG tube removal   Consider advancing diet to limited clears   Continue PPI      FEN:  mIVF. Monitor and Replete electrolytes as needed.    GU/Renal: Strict In's and Out's.  #Hyperkalemia secondary to low volume depletion  K 5.7 EKG wnl, no abnormalities. S/p 1g calcium gluconate. 1L bolus  K and Cr now stable  DC foley catheter today once OOB  Replete lytes PRN    ID: Afebrile. WBC 6.4    Hematology: Hgb 9.6  Will hold lovenox today until Hbg stable  Drain to bulb suction, monitor output    Prophylaxis: Pantoprazole IV. Prophylatic Lovenox sub-cutaneous, SCDs while in bed.   PT/OT: Ambulate with assist QID; OOB    The patient was examined and discussed with chief resident and team and seen with  Dr. Astrid Divine , who agrees with the above assessment and plan.     Sindy [place],  NP  Surgery service [phone]

## 2022-03-29 NOTE — Plan of Care (Signed)
Problem: Promotion of Health and Safety  Goal: Promotion of Health and Safety  Description: The patient remains safe, receives appropriate treatment and achieves optimal outcomes (physically, psychosocially, and spiritually) within the limitations of the disease process by discharge.    Information below is the current care plan.  Outcome: Progressing  Flowsheets  Taken 03/29/2022 [id number]  Individualized Interventions/Recommendations #1: Encourage pt to turn in bed  Individualized Interventions/Recommendations #2 (if applicable): Assess pain and provide edu on PCA pump  Individualized Interventions/Recommendations #3 (if applicable): Monitor blood sugar  Individualized Interventions/Recommendations #4 (if applicable): Maintain fall precautions and have bed alarm on  Individualized Interventions/Recommendations #5 (if applicable): Assess how pt tolerates diet.  Outcome Evaluation (rationale for progressing/not progressing) every shift: A&Ox4. VSS. Afebrile. Pain at IV site with K. New IV started. Ostomy with minimal drainage and C/D/I. JP drains intact with serosanguinous drainage. Advanced diet to clear liguids. Pt tolerating. Amb with SBA. Up to bedside commode. Urinary frequency. Fall precautions in place. PCA pump providing pain relief.  Taken 03/29/2022 0915  Patient /Family stated Goal: To get up and walk a lot  Note:

## 2022-03-29 NOTE — Interdisciplinary (Signed)
03/29/22 1428   Readmission Risk Assessment   Readmission Within 30 Days of Discharge * No   Do You Have Transportation Issues/Concerns That Make It Difficult To Get To Your Appointments?  No   Recent Hospitalizations (Within Last 6 Months) * No   High Risk For Readmission * Yes   High Risk Indicators Over age 77;Impaired mobility   Action Taken To Prevent Readmission After Discharge * PT OT ST evaluation and recommendation   Recommendations to the Physician* Therapy orders   Follow Up/Progress   Is the Patient Medically Stable for Discharge * No   CM Discharge Arrangements Complete No   Where was the patient admitted from? * Home   Barriers to Discharge * Clinical reason;Pending PT/OT recommendations/clearance;Consult Service   Anticipated Discharge Disposition/Needs Home with Family;Too soon to be determined;HH RN;Ostomy supplies;Home PT/OT   Post Acute Services Referred To TBD   PATIENT CHOICE: Patient/Family/Legal/Surrogate Decision Maker Has Been Given a List Options And Choice In The Selection of Post-Acute Care Providers Yes   Family/Caregiver's Assessed for * Readiness, willingness, and ability to provide or support self-management activities   Respite Care * Not Applicable   Patient/Family/Other Are In Agreement With Discharge Plan * To be determined   Public Health Clearance Needed * Not Applicable   Anticipated DischargeTransportation *  Car   Anticipated Discharge Transportation Details family     03/29/22  2:29 PM    Medical Intervention(s) requiring continued Hospital Stay: 77 y/o F with history of GERD, parastomal hernia, 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 POD 4, PCA, wound vac, x2 post op drains,NGT dc 3/21, Diet Sips/Ice Chips; 557ml Volume Restriction      Anticipated dispo plan & anticipated DC needs: HOME ([facility] p[facility]PT/OT)    Anticipated Transportation @ discharge: family     Barriers to Discharge: NONE

## 2022-03-29 NOTE — Progress Notes (Signed)
MIS Surgery Progress Note    Patient Name: Tiffany Chen MRN: [id number]    Hospital Day:   4 days - Admitted on: 03/25/2022  Post Operative Day(s): Day of surgery 3/18    HPI: 15F with asymptomatic GERD and history of RUL MAC requiring lobectomy as well as symtpomatic parastomal hernia. S/p repair of recurrent incisional/ventral hernia, repair of parastomal hernia with mobilization of the left rectus muscle w/ myofascial flap component separation w/ mesh, as well as ostomy takedown w/ creation of diverting loop ostomy w/ Dr. Hillery Hunter and Dr. Astrid Divine 3/18    Events/Subjective:  - NAOE, AFVSS  - NG tube out   - Denies nausea, vomiting, fevers, chills, AFVSS  - Was unable to ambulate yesterday   -Remains with minimal output from iliostomy     Vitals signs:  Latest entry:   Temperature: 98.4 F (36.9 C)  Heart Rate: 74  Respirations: 18  Blood pressure (BP): (!) 117/54  SpO2: 93 %  Weight: 67.9 kg (149 lb 12.8 oz)  Percentage Weight Change (%): 7.86 %    Temperature:  [98.2 F (36.8 C)-99 F (37.2 C)] 98.4 F (36.9 C) (03/22 0711)  Blood pressure (BP): (109-137)/(51-60) 117/54 (03/22 0711)  Heart Rate:  [66-83] 74 (03/22 0711)  Respirations:  [17-22] 18 (03/22 0711)  Pain Score: 0 (03/22 0713)  O2 Device: None (Room air) (03/21 2000)  O2 Flow Rate (L/min):  [0 l/min] 0 l/min (03/21 1600)  SpO2:  [90 %-99 %] 93 % (03/22 0711)    Intake/Output                         03/28/22 0600 - 03/29/22 0559 03/29/22 0600 - 03/30/22 0559     [id number] 1800-0559 Total 0600-1759 1800-0559 Total                 Intake    I.V.  --  1455 1455  --  -- --    Total Intake -- 1455 1455 -- -- --       Output    Urine  275  500 775  100  -- 100    Emesis/NG Output  100  -- 100  --  -- --    Drains  20  20 40  --  -- --    Stool  --  0 0  --  -- --    Total Output 395 520 915 100 -- 100       Net I/O     -395 935 540 -100 -- -100            Patient Lines/Drains/Airways Status       Active PICC Line / CVC Line / PIV Line / Drain /  Airway / Intraosseous Line / Epidural Line / ART Line / Line Type / Wound / Pressure Ulcer Injury       Name Placement date Placement time Site Days    Peripheral IV - 20 G Right Antecubital 03/25/22  0602  Antecubital  4    Peripheral IV - 20 G Left Wrist 03/25/22  0741  Wrist  4    Closed/Suction Drain -  Lateral RLQ 03/25/22  1327  RLQ  3    Closed/Suction Drain -  Lateral LLQ 03/25/22  1327  LLQ  3    Colostomy -  LLQ 03/20/21  --  LLQ  374    Ileostomy -  Loop ileostomy RLQ 03/25/22  1400  RLQ  3    Wound Vac -  Surgical Medial Abdomen 03/25/22  1344  -- 3    Incision -  03/25/22 0942 Abdomen Upper 03/25/22  0942  -- 3                    Diet Diet NPO; No; Patient intolerant of oral intake    No data recorded     Labs:  CBC  Recent Labs     03/28/22  0526 03/29/22  0647   WBC 6.4 4.6   HGB 9.6* 9.3*   HCT 29.3* 27.7*   PLT 194 215         Chemistry  Recent Labs     03/28/22  0526 03/29/22  0647   NA 138 139   K 3.9 3.4*   CL 105 106   BICARB 24 22   BUN 9 10   CREAT 0.63 0.59   GLU 147* 146*   [place] 8.3* 8.1*   MG 1.7 1.9   PHOS 2.1* 2.5*         No results for input(s): "ALK", "AST", "ALT", "TBILI", "DBILI", "ALB" in the last 72 hours.    Coags  No results for input(s): "PT", "PTT", "INR" in the last 72 hours.    Radiology:   X-Ray Abdomen Post Tube Placement    Result Date: 03/26/2022  IMPRESSION: 1. Suction-type enteric tube, coursing below the diaphragm, with side-port projecting over the at or just beyond the GE junction , with tip projecting over the gastric fundus. 2. Partially imaged surgical drain projects over the left lower quadrant of the abdomen. 3. Nonobstructive bowel gas pattern.      Microbiology:  Microbiology Results (last 30 days)       Procedure Component Value - Date/Time    Methicillin Resistant Staphylococcus aureus (MRSA), Surveillance, Nasal Angela Burke Nares [id number] Collected: 03/25/22 2103    Lab Status: Final result Specimen: Nares Updated: 03/27/22 1259     MRSA Surveillance Result  No Methicillin Resistant Staphylococcus aureus isolated.              Physical Exam:  General Appearance: healthy, alert, no distress, pleasant affect, cooperative, cooperative.  Heart:  normal rate and regular rhythm, no murmurs, clicks, or gallops.  Lungs: clear to auscultation and percussion, no chest deformities noted and Equal chest rise bilaterally.  L abdominal drain w/ serosanguinous drainage  R abdominal drain w/ serosanguinous fluid. Midline incision w/ incisional WV (to suction), midline incision c/d/I with no strikethrough. Abdomen appropriately tender . Old LUQ ostomy site w/ prevena vac to seal, R ileostomy pouched & pink, red rubber present.   Foley catheter w/ yellow urine   Extremities:  no cyanosis, clubbing, or edema.    Medications:  IV Meds   dextrose-sodium chloride 5%-0.9% 75 mL/hr at 03/29/22 0548    HYDROmorphone       Scheduled Meds   Amikacin Sulfate Liposome  590 mg Q24H    atorvastatin  10 mg Daily    azithromycin  250 mg Daily    cholecalciferol  5,000 Units Daily    CLOFAZIMINE IRB 21-0063 50 MG CAPSULE 100 mg  100 mg Daily    [Manual MAR Hold] enoxaparin  40 mg Daily    ethambutol  800 mg Daily    ferrous sulfate  324 mg Daily    fluoxetine  40 mg Daily    insulin glargine  0.1 Units/kg Daily at Noon    insulin  regular  0-10 Units Q6H    lidocaine  2 patch Q24H    loratadine  10 mg Daily    pantoprazole  40 mg Daily    rifAMPin  600 mg Daily    salmeterol  1 puff Q12H    tiotropium  1 capsule Daily     PRN Meds   albuterol  2 puff Q6H PRN    dextrose  12.5 g PRN    diphenhydrAMINE  25 mg Q4H PRN    glucagon  1 mg Once PRN    glucose  4 tablet PRN    glucose  1 Tube PRN    HYDROmorphone  0.4 mg Q4H PRN    menthol  1 lozenge Q2H PRN    nalOXone  0.1 mg Q2 Min PRN    phenol  1 spray Q2H PRN    sodium chloride  4 mL Q12H PRN         ASSESSMENT / PLAN:    [name] with history of GERD, parastomal hernia, 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 Hillery Hunter and Dr. Astrid Divine 3/18.     Recovering appropriately.     Neuro: Multi-modal pain control   Tylenol 1061m IV scheduled q8  Lidocaine patches q12 prn  Dilaudid  PCA  Benadryl 25mg  IV q4 prn for itching    CV: Atorvastatin  Pulmonary: out of bed, incentive spirometry, maintaining saturations on room air  Encourage IS every hour while awake    #MAC lung disease  Continue home [place] nebulizer   Azithromycin 250mg  PO daily  Clofazimine 100mg  BID  Ethambutol 800mg  daily    Rifampin 600mg  daily  Salmeterol 1 puff q12  Albuterol 2 puff q6 prn    GI: Maintain NGT to LIS. Flush NGT with 20 mL once per shift to maintain patency.   S/p NG tube removal   Consider advancing diet to limited clears   Continue PPI      FEN:  mIVF. Monitor and Replete electrolytes as needed.    GU/Renal: Strict In's and Out's.  #Hyperkalemia secondary to low volume depletion  K 5.7 EKG wnl, no abnormalities. S/p 1g calcium gluconate. 1L bolus  K and Cr now stable  DC foley catheter today once OOB  Replete lytes PRN    ID: Afebrile. WBC 6.4    Hematology: Hgb 9.6  Will hold lovenox today until Hbg stable  Drain to bulb suction, monitor output    Prophylaxis: Pantoprazole IV. Prophylatic Lovenox sub-cutaneous, SCDs while in bed.   PT/OT: Ambulate with assist QID; OOB    The patient was examined and discussed with chief resident and team and seen with  Dr. Astrid Divine , who agrees with the above assessment and plan.     Sindy Guadeloupe,  NP  Surgery service [phone]

## 2022-03-29 NOTE — Plan of Care (Signed)
Problem: Promotion of Health and Safety  Goal: Promotion of Health and Safety  Description: The patient remains safe, receives appropriate treatment and achieves optimal outcomes (physically, psychosocially, and spiritually) within the limitations of the disease process by discharge.    Information below is the current care plan.  Outcome: Progressing  Flowsheets  Taken 03/29/2022 0719 by Henrine Screws, RN  Individualized Interventions/Recommendations #1:   Assessed for new signs of skin breakdown   Assisted pt in turning q2h   Offloaded heels on pillows  Individualized Interventions/Recommendations #2 (if applicable):   Assessed and managed abd pain with PCA dilaudid   Managed throat discomfort with PRN lozenges   clustered care to promote rest  Individualized Interventions/Recommendations #3 (if applicable): ACHS per MD Orders  Individualized Interventions/Recommendations #4 (if applicable):   Saftey and  fall precautions   bed in lowest position, bed alarm on, call light within reach, non slip socks on  Individualized Interventions/Recommendations #5 (if applicable): Monitor JP drains, WoundVac,  and ensuring fluid output is serosanguinous.  Outcome Evaluation (rationale for progressing/not progressing) every shift: Pt AAOx4, calm and cooperative, VSS, afebrile.Bolus of LR gievn Per MD Orders,  Pt endorsed abd pain well managed with PCA dilaudid. Throat pain managed effectively with PRN lozenges. JP drains continue to have minimal serosanguinous output. Ostomy with minimal liquid brown output. Pt voiding on Bedside Commode, Bladder Scan completed, NPO, Denies N/V  No signs of infection around surgical incision sites and drain sites. Pt able to tolerate turns q2h with no new signs of skin breakdown. Fall precautions n Place with no new falls. Plan of care ongoing.  Taken 03/28/2022 2000 by Henrine Screws, RN  Patient /Family stated Goal: Sleep  Taken 03/27/2022 1517 by Marene Lenz,  RN  Guidelines: Inpatient Nursing Guidelines  Note:

## 2022-03-29 NOTE — Interdisciplinary (Signed)
Occupational Therapy Daily Treatment Note    Admitting Physician:  Diona Browner, *  Admission Date 03/25/2022    Inpatient Diagnosis:   Problem List         Codes    Impaired mobility and ADLs    -  Primary ICD-10-CM: Z74.09, Z78.9  ICD-9-CM: V49.89    Impaired mobility     ICD-10-CM: Z74.09  ICD-9-CM: 799.89            IP Start of Service  Start of Care: 03/28/22  Onset Date: 03/25/2022    Preferred Language:English         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      Past Surgical History:   Procedure Laterality Date    BRONCHOSCOPY  12/2020    CESAREAN SECTION, LOW TRANSVERSE      COLOSTOMY      CRANIOTOMY      LUNG LOBECTOMY          OT Acute       Row Name 03/29/22 1500          Type of Visit    Type of Occupational Therapy note Occupational Therapy Daily Treatment Note       Row Name 03/29/22 1500          Treatment Time    Treatment Start Time 1030     Total TIMED Treatment (min) 30     Total Treatment Time (min) 30       Row Name 03/29/22 1500          Treatment  Precautions/Restrictions    Precautions/Restrictions Fall;Multiple lines;Other (comments)     Fall Bed/chair alarm;Socks/charm     Other Precautions/Restrictions Information wound vac, ileostomy, drains x2, PCA       Row Name 03/29/22 1500          Subjective    Subjective information "They said I'm cleared to have ice chips"     Patient status Patient agreeable to treatment;Nursing in agreement for treatment;Patient pain control adequate to participate in therapy       Row Name 03/29/22 1500          Pain Assessment    Pain Asssessment Tool Numeric Pain Rating Scale       Row Name 03/29/22 1500          Numeric Pain Rating Scale    Pain Intensity - rating at present 2     Pain Intensity- rating after treatment 2     Location abdomen       Row Name 03/29/22 1500  [place] AM-PAC: Daily Activity    Assistance Needed to Put on and Take off Regular Lower Body Clothing 2     Assistance Needed to Bathe, Including Washing, Rinsing, and Drying 2     Assistance Needed to Toilet Environmental manager, Bedpan, or Urinal) 3     Assistance Needed to Put on and Take off Regular Upper Body Clothing 3     Assistance Needed to Take Care of Personal Grooming Such as Brushing Teeth 3     Assistance Needed to Eat Meals 3  NPO; able to spoon feed ice chips     AM-PAC Daily Activity Total Score 16     AMP-PAC Daily Activity Impairment rating Score 14-19 - 40-59% impaired       Row Name 03/29/22 1500          Objective    Overall Cognitive Status Intact - no cognitive limitations or impairments noted     Other Objective Findings Pt received sitting in chair via hand-off from PT following gait training/walking in hallway.     TherEx:   - AROM: shoulder HEP/hand-out provided with shoulder AROM exercises (flex/ext, abd/add, horizontal abd/add) to facilitate functional movement patterns and promote joint mobility (pmhx includes x2-separate episodes of frozen shoulder R/L; pt encouraged to incorporate frequent shoulder mobility exercises into daily  routine).     - Strengthening (thera-band): hand-out and light-resistance theraband provided along with visual demonstration and verbal explanation of exercises (straight arm pulls, diagonals, shoulder ER); pt demo'd abiity to safely complete 1x5-10 repetitions of ea. No c/o pain associated with exercise completion.     Misc: pt encouraged to incorporate 3 daily tasks as part of functional maintenance plan/routine, including (1) completing HEP 3x/day (2) OOB to chair 3x/day (3) daily walk(s) with nursing staff. Pt agreeable.     Written on white board for increased visibility to promote improved consistency/compliance.                     OT Acute Tool Box       Row Name 03/29/22 1500          Cognition Assessment    Overall Cognitive Status Intact - no cognitive limitations or impairments noted                        Eval cont.       [place] Name 03/29/22 1500          Patient/Family Education    Learner(s) Patient     Learner response to rehab patient education interventions Verbalizes understanding;Able to return demonstrate teaching;Education materials provided     Patient/family training comments HEP       Row Name 03/29/22 1500          Assessment    Assessment Pt is currently performing below functional baseline for completion of self-care tasks and functional mobility. Pt is limited by abdominal discomfort, balance deficits, strength deficits, and activity tolerance deficits which is impacting participation, performance, and safety during ADLs. Pt will continue to benefit from skilled inpatient occupational therapy services to further address functional deficits in order to maximize safety and independence.      POST ACUTE REHABILITATION DISCHARGE RECOMMENDATIONS:    Recommended Post-Acute Therapy Follow up:   Ongoing Skilled Therapy after Discharge    If available, recommend discharge to a setting with the following assistance for the listed deficits:     Deficits :   All mobility related ADLs assist  level:  Supervision  Toileting assist level: Minimum (25% or less assistance)      Other Post Acute Discharge Recommendations : Pt with good potential for discharge to home with family available if needed for intermittent assistance with ADLs + home health.     Equipment recommendations:   To be determined as patient progresses in therapy (pt owns shower chair; need for [facility] within the home TBD)       Rehab Potential Excellent       Row Name 03/29/22 1500          Treatment Plan Disussion    Treatment Plan Discussion and Agreement Patient/family/caregiver stated understanding and agreement with the therapy plan;Patient support system determined and all questions were asked and answered       [place] Name 03/29/22 1500          Treatment Plan    Continue therapy to address Activity tolerance limitation;Decline in functional ability/mobility;Decline in performance of activities of daily living (ADL)     Frequency of treatment 5 times per week     Duration of treatment (number of visits) Treatment will continue while in hospital and in need of skilled therapy services     Status of treatment Patient evaluated and will benefit from ongoing skilled therapy       Row Name 03/29/22 1500          Patient Safety Considerations     Patient safety considerations Patient returned to bed at end of treatment;Call light left in reach and fall precautions in place;Patient may be at risk for falls     Patient assistive device requirements for safe ambulation [place] Name 03/29/22 1500          Patient Mobilization Recommendations (as tolerated)     Toileting Patient to use bedside commode     Ambulation Patient to ambulate with nursing two times per day shift, once per night shift     Out of bed Patient out of bed to chair for all meals     Device  None needed     Safe patient handling equipment  None needed       [place] Name 03/29/22 1500          Therapy Plan Communication    Therapy Plan Communication Discussed therapy plan with Nursing  and/or Physician       [place] Name 03/29/22 1500          Occupational Therapy Patient Discharge Instructions    Your Occupational Therapist suggests the following Continue to complete your home exercise program daily as instructed;Continue to complete your self care Activities of Daily Living as frequently as possible       Row Name 03/29/22 1500          Therapeutic Procedures    Therapeutic Exercise ([id number]) Range of motion exercises;Resistive band exercises;Home Exercise Program (HEP) demonstration and performance;Patient education     Location Upper extremity         Total TIMED Treatment (min) 30                     The occupational therapist of record is endorsed by evaluating occupational therapist.

## 2022-03-29 NOTE — Interdisciplinary (Addendum)
Physical Therapy Daily Treatment Note    Admitting Physician:  Diona Browner, *  Admission Date 03/25/2022    Inpatient Diagnosis:   Problem List         Codes    Impaired mobility and ADLs    -  Primary ICD-10-CM: Z74.09, Z78.9  ICD-9-CM: V49.89    Impaired mobility     ICD-10-CM: Z74.09  ICD-9-CM: 799.89            IP Start of Service   Start of Care: 03/28/22  Reason for referral: Activity tolerance limitation;Decline in functional ability/mobility;Range of motion/strength limitations    Preferred Language:English         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      Past Surgical History:   Procedure Laterality Date    BRONCHOSCOPY  12/2020    CESAREAN SECTION, LOW TRANSVERSE      COLOSTOMY      CRANIOTOMY      LUNG LOBECTOMY          PT Acute       Row Name 03/29/22 1300          Type of Visit    Type of Physical Therapy note Physical Therapy Daily Treatment Note       Row Name 03/29/22 1300          Treatment Precautions/Restrictions    Precautions/Restrictions  Fall;Multiple lines;Postsurgical/procedural     Fall Socks/charm;Bed/chair alarm     Other Precautions/Restrictions Information Wound vac, PCA, PIVs, JP drain x2, illeostomy       Row Name 03/29/22 1300          Medical History    History of presenting condition Per chart, patient is a 42F with asymptomatic GERD and history of RUL MAC requiring lobectomy as well as symtpomatic parastomal hernia. S/p repair of recurrent incisional/ventral hernia, repair of parastomal hernia with mobilization of the left rectus muscle w/ myofascial flap component separation w/ mesh, as well as ostomy takedown w/ creation of diverting loop ostomy w/ Dr. Hillery Hunter and Dr. Astrid Divine 3/18       [place] Name 03/29/22 1300          Subjective    Patient status Patient agreeable to treatment;Nursing in agreement for treatment;Patient pain control adequate to participate in therapy       Row Name 03/29/22 1300          Pain Assessment    Pain Asssessment Tool Numeric Pain Rating Scale       Row Name 03/29/22 1300  Numeric Pain Rating Scale    Pain Intensity - rating at present 2     Pain Intensity- rating after treatment 3     Location abdomen       Row Name 03/29/22 1300          Objective    Overall Cognitive Status Intact - no cognitive limitations or impairments noted     Communication No communication limitations or impairments noted. Current status of hearing, speech and vision allow functional communication.     Coordination/Motor control No limitations or impairments noted. Movement patterns are fluid and coordinated throughout     Balance Balance limitations present     Static Sitting Balance Good - able to maintain balance without handhold support, limited postural sway     Dynamic Sitting Balance Good - accepts moderate challenge, able to maintain balance while picking object off floor     Static Standing Balance Good - able to maintain balance without handhold support, limited postural sway     Dynamic Standing Balance Fair -  accepts minimal challenge, able to maintain balance while turning head/trunk     Functional Mobility Functional mobility deficits present     Bed Mobility Supervised     Transfers to/from Stand Supervised     Transfer Comments Sit to stand from EOB and toilet     Gait Other (comments)     Gait Comments SBA - demo reciprocal gait pattern, good cadence. No loss of balance.      Device used for ambulation/mobility None     Device used Comments Patient prefers to use IV pole for support      Ambulation Distance 115 ft     Other Objective Findings BP stable prior to ambulation. Patient independent with toileting in restroom and ADLs when standing in front of sink.    Educated patient in seated HEP and to ambulate with nursing assist over weekend to prevent deconditioning. Patient verbalized good understanding. RN also informed.      Patient seated in chair at end of session. All lines intact, needs met. Hand-off to OT.                            Eval cont.       [place] Name 03/29/22 1300          [place] AM-PAC: Basic Mobility    Assistance Needed to Turn from Back to Side While in a Flat Bed Without Using Bedrails 4 - None (independent)     Difficulty with Supine to Sit Transfer 3 - A little (supervised/min assist)     How Much Help Needed to Move to/from Bed to Chair 3 - A little (supervised/min assist)     Difficulty with Sit to Stand Transfer from Chair with Arms 3 - A little (supervised/min assist)     How Much Help Needed to Walk in Room 3 - A little (supervised/min assist)     How Much Help Needed to Climb 3-5 Steps with a Rail 3 - A little (supervised/min assist)     AMPAC Total Score 19     Assessment: AM-PAC Basic Mobility Impairment Rating Score 19-22 - 20-39% impaired       Row Name 03/29/22 1300          Patient/Family Education    Learner(s) Patient     Learner response to rehab patient education interventions Verbalizes understanding;Able to return demonstrate teaching  Patient/family training comments Role  of PT, POC, safety and activity recs.       [place] Name 03/29/22 1300          Assessment    Assessment Pt presents with deficits in Balance and Activity tolerance affecting functional mobility. Pt is functioning below their reported baseline, currently requires one person assist for safe gait, and will benefit from ongoing skilled PT to maximize functional independence and reach stated functional goals.     Hospital mobility recommendations for nursing/staff: OOB to chair for meals with one person assist, Ambulate with nursing/staff supervision/assist at least 2 times daily , and Continue home exercise program as instructed    POST ACUTE REHABILITATION DISCHARGE RECOMMENDATIONS:    Recommended Post-Acute Therapy Follow up:   Home with assist from family and Home PT. Recommend assist for the following:    Deficits :   Gait: assist level: SBA  Stairs: assist level: MinA    Equipment recommendations:   To be determined as patient progresses in therapy . FWW/SPC versus none.         Rehab Potential Excellent       Row Name 03/29/22 1300          Patient stated Goal    Patient stated goal to walk       [place] Name 03/29/22 1300          Planned Therapy Interventions and Rationale    Gait Training to normalize gait pattern and improve safety while ambulating;to normalize gait pattern and improve safety while ambulating with assistive device;to improve safety with stair navigation     Therapeutic Activities to improve functional mobility and ability to navigate in the home and/or community;to improve transfers between surfaces     Theraputic Exercise to improve activity tolerance to allow greater independence with functional mobility skills       Row Name 03/29/22 1300          Treatment Plan Disussion    Treatment Plan Discussion and Agreement Patient/family/caregiver stated understanding and agreement with the therapy plan       Row Name 03/29/22 1300          Treatment Plan    Continue therapy to address Activity tolerance  limitation;Decline in functional ability/mobility     Frequency of treatment 4 times per week     Duration of treatment (number of visits) While patient is hospitalized and in need of skilled therapy services     Status of treatment Patient evaluated and will benefit from ongoing skilled therapy       Row Name 03/29/22 1300          Patient Safety Considerations     Patient safety considerations Patient left sitting at end of treatment;Call light left in reach and fall precautions in place;Nursing notified of safety considerations at end of treatment     Patient assistive device requirements for safe ambulation Other (Comments)  TBD, patient currently using IV pole for support       Row Name 03/29/22 1300          Therapy Plan Communication    Therapy Plan Communication Discussed therapy plan with Nursing and/or Physician;Encouraged out of bed with assistance by     Encouraged out of bed with assistance by Nursing;Staff       Row Name 03/29/22 1300          Physical Therapy Patient Discharge Instructions    Your Physical Therapist suggests the following Continue  to use correct body mechanics when moving in and out of bed as instructed;Continue to complete your home exercise program daily as instructed;Supervision with walking is suggested for increased safety       Row Name 03/29/22 1300          Therapeutic Procedures    Gait Training [phone]) Gait pattern analysis and treatment of deviations;Patient education;Postural alighnment/biomechanic training during gait        Total TIMED Treatment (min)  30       [place] Name 03/29/22 1300          Treatment Time     Total TIMED Treatment  (min) 30     Total Treatment Time (min) 30     Treatment start time 1115                        The physical therapist of record is endorsed by evaluating physical therapist.

## 2022-03-30 LAB — GLUCOSE (POCT)
Glucose (POCT): 106 mg/dL — ABNORMAL HIGH (ref 70–99)
Glucose (POCT): 136 mg/dL — ABNORMAL HIGH (ref 70–99)
Glucose (POCT): 145 mg/dL — ABNORMAL HIGH (ref 70–99)
Glucose (POCT): 148 mg/dL — ABNORMAL HIGH (ref 70–99)
Glucose (POCT): 153 mg/dL — ABNORMAL HIGH (ref 70–99)

## 2022-03-30 LAB — HEMOGRAM, BLOOD
Hct: 27.4 % — ABNORMAL LOW (ref 34.0–45.0)
Hgb: 9.2 gm/dL — ABNORMAL LOW (ref 11.2–15.7)
MCH: 31.4 pg (ref 26.0–32.0)
MCHC: 33.6 g/dL (ref 32.0–36.0)
MCV: 93.5 um3 (ref 79.0–95.0)
MPV: 9.5 fL (ref 9.4–12.4)
Plt Count: 242 10*3/uL (ref 140–370)
RBC: 2.93 10*6/uL — ABNORMAL LOW (ref 3.90–5.20)
RDW: 12.7 % (ref 12.0–14.0)
WBC: 4.2 10*3/uL (ref 4.0–10.0)

## 2022-03-30 LAB — BASIC METABOLIC PANEL, BLOOD
Anion Gap: 9 mmol/L (ref 7–15)
BUN: 9 mg/dL (ref 8–23)
Bicarbonate: 23 mmol/L (ref 22–29)
Calcium: 8.1 mg/dL — ABNORMAL LOW (ref 8.5–10.6)
Chloride: 109 mmol/L — ABNORMAL HIGH (ref 98–107)
Creatinine: 0.6 mg/dL (ref 0.51–0.95)
Glucose: 142 mg/dL — ABNORMAL HIGH (ref 70–99)
Potassium: 3.6 mmol/L (ref 3.5–5.1)
Sodium: 141 mmol/L (ref 136–145)
eGFR Based on CKD-EPI 2021 Equation: >60 mL/min/{1.73_m2}

## 2022-03-30 LAB — PHOSPHORUS, BLOOD: Phosphorous: 2.7 mg/dL (ref 2.7–4.5)

## 2022-03-30 LAB — MAGNESIUM, BLOOD: Magnesium: 1.9 mg/dL (ref 1.6–2.4)

## 2022-03-30 MED ORDER — HYDROMORPHONE HCL 1 MG/ML IJ SOLN
1.0000 mg | INTRAMUSCULAR | Status: DC | PRN
Start: 2022-03-30 — End: 2022-03-31

## 2022-03-30 MED ORDER — AMIKACIN SULFATE LIPOSOME 590 MG/8.4ML IN SUSP
590.0000 mg | RESPIRATORY_TRACT | Status: DC
Start: 2022-03-30 — End: 2022-04-03
  Administered 2022-03-30 – 2022-04-03 (×5): 590 mg via RESPIRATORY_TRACT
  Filled 2022-03-30 (×2): qty 8.4

## 2022-03-30 MED ORDER — HYDROMORPHONE HCL 1 MG/ML IJ SOLN
0.5000 mg | INTRAMUSCULAR | Status: DC | PRN
Start: 2022-03-30 — End: 2022-03-31
  Administered 2022-03-30 (×2): 0.5 mg via INTRAVENOUS
  Filled 2022-03-30: qty 1

## 2022-03-30 MED ORDER — HYDROMORPHONE HCL 1 MG/ML IJ SOLN
0.5000 mg | INTRAMUSCULAR | Status: DC | PRN
Start: 2022-03-30 — End: 2022-03-31
  Filled 2022-03-30: qty 1

## 2022-03-30 NOTE — Progress Notes (Signed)
MIS Surgery Progress Note    Patient Name: Tiffany Chen MRN: [id number]    Hospital Day:   5 days - Admitted on: 03/25/2022  Post Operative Day(s): Day of surgery 3/18    HPI: 17F with asymptomatic GERD and history of RUL MAC requiring lobectomy as well as symtpomatic parastomal hernia. S/p repair of recurrent incisional/ventral hernia, repair of parastomal hernia with mobilization of the left rectus muscle w/ myofascial flap component separation w/ mesh, as well as ostomy takedown w/ creation of diverting loop ostomy w/ Dr. Hillery Hunter and Dr. Astrid Divine 3/18    Events/Subjective:  - NAOE, AFVSS  - Ileostomy with gurgling, producing dark liquid output. 50 mL output.   - Reports feeling like she is not voiding compltely  - Denies nausea, vomiting, AFVSS  - Tolerating clear liquid diet  - Pain well controlled  - Ambulating  - Has room for improvement on IS     Vitals signs:  Latest entry:   Temperature: 98.3 F (36.8 C)  Heart Rate: 77  Respirations: 20  Blood pressure (BP): 134/69  SpO2: 97 %  Weight: 67.9 kg (149 lb 12.8 oz)  Percentage Weight Change (%): 7.86 %    Temperature:  [98 F (36.7 C)-98.8 F (37.1 C)] 98.3 F (36.8 C) (03/23 0802)  Blood pressure (BP): (129-159)/(56-76) 134/69 (03/23 0802)  Heart Rate:  [72-82] 77 (03/23 0921)  Respirations:  [16-20] 20 (03/23 0921)  Pain Score: Patient Sleeping, Respiratory Assessment Done (03/23 0730)  O2 Device: None (Room air) (03/23 0802)  SpO2:  [91 %-97 %] 97 % (03/23 0921)    Intake/Output                         03/29/22 0600 - 03/30/22 0559 03/30/22 0600 - 03/31/22 0559     [id number] 1800-0559 Total 0600-1759 1800-0559 Total                 Intake    P.O.  --  430 430  --  -- --    I.V.  300  100 400  --  -- --    Drain (Flush)  0  -- 0  --  -- --    Total Intake 300 530 830 -- -- --       Output    Urine  200  180 380  50  -- 50    Drains  30  15 45  --  -- --    Stool  0  50 50  --  -- --    Total Output 230 245 475 50 -- 50       Net I/O     70 285 355  -50 -- -50            Patient Lines/Drains/Airways Status       Active PICC Line / CVC Line / PIV Line / Drain / Airway / Intraosseous Line / Epidural Line / ART Line / Line Type / Wound / Pressure Ulcer Injury       Name Placement date Placement time Site Days    Peripheral IV - 20 G Left Wrist 03/25/22  0741  Wrist  5    Peripheral IV - 20 G Right Hand 03/29/22  1530  Hand  less than 1    Closed/Suction Drain -  Lateral RLQ 03/25/22  1327  RLQ  4    Closed/Suction Drain -  Lateral LLQ 03/25/22  1327  LLQ  4    Colostomy -  LLQ 03/20/21  --  LLQ  375    Ileostomy -  Loop ileostomy RLQ 03/25/22  1400  RLQ  4    Wound Vac -  Surgical Medial Abdomen 03/25/22  1344  -- 4    Incision -  03/25/22 0942 Abdomen Upper 03/25/22  0942  -- 5                    Diet Diet Clear Liquid; Zero Carbohydrate Clear Liquid No carbonated beverages    No data recorded     Labs:  CBC  Recent Labs     03/29/22  0647 03/30/22  0601   WBC 4.6 4.2   HGB 9.3* 9.2*   HCT 27.7* 27.4*   PLT 215 242         Chemistry  Recent Labs     03/29/22  0647 03/30/22  0601   NA 139 141   K 3.4* 3.6   CL 106 109*   BICARB 22 23   BUN 10 9   CREAT 0.59 0.60   GLU 146* 142*   [place] 8.1* 8.1*   MG 1.9 1.9   PHOS 2.5* 2.7         No results for input(s): "ALK", "AST", "ALT", "TBILI", "DBILI", "ALB" in the last 72 hours.    Coags  No results for input(s): "PT", "PTT", "INR" in the last 72 hours.    Radiology:   X-Ray Abdomen Post Tube Placement    Result Date: 03/26/2022  IMPRESSION: 1. Suction-type enteric tube, coursing below the diaphragm, with side-port projecting over the at or just beyond the GE junction , with tip projecting over the gastric fundus. 2. Partially imaged surgical drain projects over the left lower quadrant of the abdomen. 3. Nonobstructive bowel gas pattern.      Microbiology:  Microbiology Results (last 30 days)       Procedure Component Value - Date/Time    Methicillin Resistant Staphylococcus aureus (MRSA), Surveillance, Nasal Angela Burke  Nares [id number] Collected: 03/25/22 2103    Lab Status: Final result Specimen: Nares Updated: 03/27/22 1259     MRSA Surveillance Result No Methicillin Resistant Staphylococcus aureus isolated.              Physical Exam:  General Appearance: healthy, alert, no distress, pleasant affect, cooperative, cooperative.  Heart:  normal rate and regular rhythm, no murmurs, clicks, or gallops.  Lungs: clear to auscultation and percussion, no chest deformities noted and Equal chest rise bilaterally.  L abdominal drain w/ serosanguinous drainage  R abdominal drain w/ serosanguinous fluid. Midline incision w/ incisional WV (to suction), midline incision c/d/I with no strikethrough. Abdomen appropriately tender . Old LUQ ostomy site w/ prevena vac to seal, R ileostomy pouched & pink, red rubber present.   Foley catheter w/ yellow urine   Extremities:  no cyanosis, clubbing, or edema.    Medications:  IV Meds   dextrose-sodium chloride 5%-0.9% 75 mL/hr at 03/29/22 2256    HYDROmorphone       Scheduled Meds   Amikacin Sulfate Liposome  590 mg Q24H    atorvastatin  10 mg Daily    azithromycin  250 mg Daily    cholecalciferol  5,000 Units Daily    CLOFAZIMINE IRB [id number] 50 MG CAPSULE 100 mg  100 mg Daily    enoxaparin  40 mg Daily    ethambutol  800 mg Daily  ferrous sulfate  324 mg Daily    fluoxetine  40 mg Daily    insulin glargine  0.1 Units/kg Daily at Noon    insulin regular  0-10 Units Q6H    lidocaine  2 patch Q24H    loratadine  10 mg Daily    pantoprazole  40 mg Daily    rifAMPin  600 mg Daily    salmeterol  1 puff Q12H    tiotropium  1 capsule Daily     PRN Meds   albuterol  2 puff Q6H PRN    dextrose  12.5 g PRN    diphenhydrAMINE  25 mg Q4H PRN    glucagon  1 mg Once PRN    glucose  4 tablet PRN    glucose  1 Tube PRN    HYDROmorphone  0.4 mg Q4H PRN    menthol  1 lozenge Q2H PRN    nalOXone  0.1 mg Q2 Min PRN    phenol  1 spray Q2H PRN    sodium chloride  4 mL Q12H PRN         ASSESSMENT / PLAN:    [name] with history of GERD, parastomal hernia, 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 Hillery Hunter and Dr. Astrid Divine 3/18.     Recovering appropriately.     Neuro: Multi-modal pain control   Tylenol 1050m IV scheduled q8  Lidocaine patches q12 prn  D/C Dilaudid  PCA - switch to PRN IV pushes  Benadryl 25mg  IV q4 prn for itching    CV: Atorvastatin  Pulmonary: out of bed, incentive spirometry, maintaining saturations on room air  Encourage IS every hour while awake    #MAC lung disease  Continue home Arikayce nebulizer   Azithromycin 250mg  PO daily  Clofazimine 100mg  BID  Ethambutol 800mg  daily    Rifampin 600mg  daily  Salmeterol 1 puff q12  Albuterol 2 puff q6 prn    GI: Maintain NGT to LIS. Flush NGT with 20 mL once per shift to maintain patency.   S/p NG tube removal   Tolerating clear diet, will unlimit clear liquids  Continue PPI      FEN:  mIVF. Monitor and Replete electrolytes as needed.    GU/Renal: Strict In's and Out's.  #Hyperkalemia secondary to low volume depletion  K 5.7 EKG wnl, no abnormalities. S/p 1g calcium gluconate. 1L bolus  K and Cr now stable  Replete lytes PRN  Obtain PVRs today    ID: Afebrile. WBC 4.2    Hematology: Hgb 9.2  lovenox  Drain to bulb suction, monitor output    Prophylaxis: Pantoprazole IV. Prophylatic Lovenox sub-cutaneous, SCDs while in bed.   PT/OT: Ambulate with assist QID; OOB    The patient was examined and discussed with chief resident and team and seen with  Dr. Astrid Divine , who agrees with the above assessment and plan.    Shellee Milo, MD  General Surgery PGY3

## 2022-03-30 NOTE — Plan of Care (Signed)
Problem: Promotion of Health and Safety  Goal: Promotion of Health and Safety  Description: The patient remains safe, receives appropriate treatment and achieves optimal outcomes (physically, psychosocially, and spiritually) within the limitations of the disease process by discharge.    Information below is the current care plan.  Outcome: Progressing  Flowsheets  Taken 03/30/2022 0504 by Shea Stakes, RN  Individualized Interventions/Recommendations #1: Encourage pt. to ambulate to bathroom in order to promote bowel function.  Individualized Interventions/Recommendations #2 (if applicable): Monitor ostomy and drain outputs.  Individualized Interventions/Recommendations #5 (if applicable): Encourage pt. to adhere to fluid restriction and monitor tolerance of diet.  Outcome Evaluation (rationale for progressing/not progressing) every shift: Pt. remained A/Ox4 overnight but complained of feeling loopy which is probably related to Dilaudid PCA. Pt. refused Potassium replacement due to burning in IV even after dilution and heat packs were applied. MD notified of refusal. Pt. has been frequently using BSC to void. Ostomy is having more gas and liquid output. Pt. is adhering to clear liquid diet and fluid restriction. PCA in use for pain. JP drains with minimal output.  Taken 03/29/2022 1852 by Jose Persia, RN  Individualized Interventions/Recommendations #4 (if applicable): Maintain fall precautions and have bed alarm on  Note:

## 2022-03-30 NOTE — Plan of Care (Signed)
Problem: Promotion of Health and Safety  Goal: Promotion of Health and Safety  Description: The patient remains safe, receives appropriate treatment and achieves optimal outcomes (physically, psychosocially, and spiritually) within the limitations of the disease process by discharge.    Information below is the current care plan.  Outcome: Progressing  Flowsheets  Taken 03/30/2022 1806 by Fernanda Drum, RN  Outcome Evaluation (rationale for progressing/not progressing) every shift: Pt AOx4, MAE, NAD. VSS. Pt doing well off Dilaudid PCA, tolerating prn Dilaudid. Pt has poor appetite, clear liquid idet.  Taken 03/30/2022 0800 by Fernanda Drum, RN  Patient /Family stated Goal: To get some food  Taken 03/30/2022 0504 by Shea Stakes, RN  Individualized Interventions/Recommendations #1: Encourage pt. to ambulate to bathroom in order to promote bowel function.  Individualized Interventions/Recommendations #2 (if applicable): Monitor ostomy and drain outputs.  Taken 03/29/2022 1852 by Jose Persia, RN  Individualized Interventions/Recommendations #3 (if applicable): Monitor blood sugar  Individualized Interventions/Recommendations #4 (if applicable): Maintain fall precautions and have bed alarm on  Taken 03/27/2022 1517 by Marene Lenz, RN  Guidelines: Inpatient Nursing Guidelines  Note:

## 2022-03-30 NOTE — Interdisciplinary (Addendum)
Nutrition Note      Evaluation Type: Initial    Recommendations:    -Diet advance per MD  (goal low fiber diet)  -Bowel regimen per MD  -Monitor Wt at least weekly   -Check vit D level to warrant supplementation  -If unable to advance to solid foods by 3/25-27, consult RD for PN recommendations.                                                                                                                                          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 Hillery Hunter and Dr. Astrid Divine 3/18.      Nutrition Summary   Current Nutrition Regimen: zero CHO Clear liquid diet NPO/Clears for 5 days .   Source of Information: Chart Review;Spoke with patient  Barriers to Intake: Altered GI function  Additional Comments: Pt states that she is taking liquids, but feels theyre too sweet. Would like education prior to d/c for post ileostomy, just for refresher since she had colostomy prior. PTA, pt was eating well. Wt has been stable (she had gained 20lb backlate last year).  Adequacy of Nutrition Intake: Meeting <25% of estimated needs    Anthropometrics   Height - Most Recent Measurement   03/30/22 5\' 3"  (1.6 m)       Weight For Nutrition Equations: 67.9 kg (149 lb 11.1 oz)  Weight Trends: Trends up from mid last year  BMI for Nutrition Calculations: 26.51  Ideal Body Weight (kg): 52.14  Percent of Ideal Body Weight: 130.22 %  Usual Body Weight (Dietary): 61.7 kg (136 lb) (sept 2023)  Change from UBW (%): 10.07 %  Time Frame of Weight Change From UBW: 6-12 months    Weights (last 14 days)       Date/Time Weight Weight Source Percentage Weight Change (%) Who    03/25/22 0545 67.9 kg (149 lb 12.8 oz) Standing scale 7.86 % AT            Wt Readings from Last 20 Encounters:   03/25/22 67.9 kg (149 lb 12.8 oz)   03/11/22 70.1 kg (154 lb 8.7 oz)   03/06/22 68 kg (150 lb)   02/11/22 68.3 kg (150 lb 9.2 oz)   01/30/22 65.8 kg (145 lb)   01/15/22 65 kg (143 lb 4.8 oz)   12/28/21 64.4 kg (142 lb)   12/19/21 63.5 kg (140 lb)   12/18/21 64.4 kg (142 lb)   12/17/21 66 kg (145 lb 8.1 oz)   11/19/21 66.1 kg (145 lb 11.6 oz)   10/22/21 65.6 kg (144 lb 10 oz)   10/16/21 64.5 kg (142 lb 3.2  oz)   10/16/21 64.5 kg (142 lb 1.6 oz)   09/24/21 63 kg (138 lb 14.2 oz)   09/11/21 61.7 kg (136 lb)   08/27/21 62.1 kg (136 lb 14.5 oz)   08/22/21 61.6 kg (135 lb 14.4 oz)   08/15/21 62.4 kg (137 lb 8 oz)   07/30/21 61.9 kg (136 lb 7.4 oz)     Estimated Needs  Calories: 1494 kcal/day - 1697 kcal/day (22 kcal/kg/day - 25 kcal/kg/day x 67.9 kg (149 lb 11.1 oz))  Protein: 68 g/day - 81 g/day (1 g/kg/day - 1.2 g/kg/day x 67.9 kg (149 lb 11.1 oz))  Fluids: 1494 mL/day - 1697 mL/day (22 mL/kg/day - 25 mL/kg/day x 67.9 kg (149 lb 11.1 oz)) or per MD    Harvel Quale Equation: [id number]    Nutrition Focused Physical Exam   Body Fat Loss    Orbital Within Defined Limits (03/30/22 1346)   Upper Arm Within Defined Limits (03/30/22 1346)   Thoracic/Lumbar Within Defined Limits (03/30/22 1346)   Muscle Mass Loss    Temple Within Defined Limits (03/30/22 1346)   Clavicle Bone Region Within Defined Limts (03/30/22 1346)   Deltoid Within Defined Limits (03/30/22 1346)   Scapula Bone Region Within Defined Limits (03/30/22 1346)   Interosseous Within Defined Limits (03/30/22 1346)   Anterior Thigh Within Defined Limits (03/30/22 1346)   Patellar Region Within Defined Limits (03/30/22 1346)   Posterior Calf Within Defined Limits (03/30/22 1346)   Micronutrient Deficiency       Edema:       Nutrition Diagnosis  Nutrition Diagnosis: Altered GI function  Related To: Conditions associated with medical diagnosis  As Evidenced By: NPO status  Status: New    Clinical Considerations:   Allergies:  Walnuts  IV Access - Peripheral:  Peripheral IV - 20 G Left Wrist (Active)       Peripheral IV - 20 G Right Hand (Active)     Tubes and Drains:  Closed/Suction Drain -  Lateral RLQ (Active)       Closed/Suction Drain -  Lateral LLQ (Active)       Ileostomy -  Loop ileostomy RLQ (Active)       Wound Vac -  Surgical Medial Abdomen (Active)     GI:  Stool Assessment for the past 168 hrs:   Stool Occurrence Stool Color Stool Appearance Stool Source   03/29/22 0719 0 -- -- --   03/30/22 0322 -- Black Partially liquid Other (Comment)     Skin Integrity:  Skin Integrity (WDL): Exceptions to WDL  Skin Integrity  Skin Color: Normal for ethnicity  Skin Temp: Warm;Dry  Generalized Skin Integrity: Other (Comment) (abd ostomy, abd incision, abd drains)  Wounds/Incisions:  Incision -  03/25/22 0942 Abdomen Upper (Active)     Labs: reviewed   Recent Labs     03/28/22  0526 03/29/22  0647 03/30/22  0601   NA 138 139 141   K 3.9 3.4* 3.6   CL 105 106 109*   BICARB 24 22 23    BUN 9 10 9    CREAT 0.63 0.59 0.60   GLU 147* 146* 142*   [place] 8.3* 8.1* 8.1*   MG 1.7 1.9 1.9   PHOS 2.1* 2.5* 2.7   WBC 6.4 4.6 4.2     Lab Results   Component Value Date    CHOL 163 05/16/2021    HDL 53 05/16/2021    LDLCALC 81 05/16/2021    TRIG 145 05/16/2021  Lab Results   Component Value Date    A1C 6.3 (H) 03/28/2022    A1C 6.2 (H) 12/25/2021    A1C 6.8 (H) 06/26/2021     Recent Labs     03/28/22  1751 03/29/22  0025 03/29/22  0627 03/29/22  1115 03/29/22  1812 03/30/22  0008 03/30/22  0557 03/30/22  1121   GLUCPOCT 122* 143* 150* 151* 149* 136* 145* 148*     No results found for: "VITD25HYDROX", "VITAMIND25HY", "VD2", "VD3", "VDT"  Medication Review Comments: reviewed  IV:    dextrose-sodium chloride 5%-0.9% 75 mL/hr at 03/30/22 1319     Scheduled:    Amikacin Sulfate Liposome  590 mg Q24H    atorvastatin  10 mg Daily    azithromycin  250 mg Daily    cholecalciferol  5,000 Units Daily    CLOFAZIMINE IRB 21-0063 50 MG CAPSULE 100 mg  100 mg Daily     enoxaparin  40 mg Daily    ethambutol  800 mg Daily    ferrous sulfate  324 mg Daily    fluoxetine  40 mg Daily    insulin glargine  0.1 Units/kg Daily at Noon    insulin regular  0-10 Units Q6H    lidocaine  2 patch Q24H    loratadine  10 mg Daily    pantoprazole  40 mg Daily    rifAMPin  600 mg Daily    salmeterol  1 puff Q12H    tiotropium  1 capsule Daily     Discharge: pending clinical course  Education: when clinically appropriate     RD/DTR to monitor/evaluate: labs, weight trends, oral or nutrition support status, and signs and symptoms of new skin concerns. Relayed recommendations to MD.     Will continue to follow patient per approved [facility] Prioritization Schedule guidelines. Nutrition Services remains available via [place] should patient medical status change.    [place], RD, CNSC  03/30/2022

## 2022-03-31 ENCOUNTER — Inpatient Hospital Stay (HOSPITAL_BASED_OUTPATIENT_CLINIC_OR_DEPARTMENT_OTHER): Payer: Medicare Other

## 2022-03-31 DIAGNOSIS — Z0489 Encounter for examination and observation for other specified reasons: Secondary | ICD-10-CM

## 2022-03-31 DIAGNOSIS — R918 Other nonspecific abnormal finding of lung field: Secondary | ICD-10-CM

## 2022-03-31 LAB — CBC WITH DIFF, BLOOD
ANC-Automated: 2.8 10*3/uL (ref 1.6–7.0)
Abs Basophils: 0 10*3/uL (ref <0.2)
Abs Eosinophils: 0.2 10*3/uL (ref 0.0–0.5)
Abs Lymphs: 0.8 10*3/uL (ref 0.8–3.1)
Abs Monos: 0.6 10*3/uL (ref 0.2–0.8)
Basophils: 1 %
Eosinophils: 4 %
Hct: 27.5 % — ABNORMAL LOW (ref 34.0–45.0)
Hgb: 9.5 gm/dL — ABNORMAL LOW (ref 11.2–15.7)
Lymphocytes: 18 %
MCH: 31.8 pg (ref 26.0–32.0)
MCHC: 34.5 g/dL (ref 32.0–36.0)
MCV: 92 um3 (ref 79.0–95.0)
MPV: 9.4 fL (ref 9.4–12.4)
Monocytes: 13 %
Plt Count: 258 10*3/uL (ref 140–370)
RBC: 2.99 10*6/uL — ABNORMAL LOW (ref 3.90–5.20)
RDW: 12.8 % (ref 12.0–14.0)
Segs: 64 %
WBC: 4.4 10*3/uL (ref 4.0–10.0)

## 2022-03-31 LAB — URINALYSIS WITH CULTURE REFLEX, WHEN INDICATED
Bilirubin: NEGATIVE
Glucose: NEGATIVE
Ketones: NEGATIVE
Leuk Esterase: NEGATIVE Leu/uL
Nitrite: NEGATIVE
Protein: NEGATIVE
Specific Gravity: 1.018 (ref 1.002–1.030)
Urobilinogen: NEGATIVE
pH: 6 (ref 5.0–8.0)

## 2022-03-31 LAB — BASIC METABOLIC PANEL, BLOOD
Anion Gap: 12 mmol/L (ref 7–15)
BUN: 7 mg/dL — ABNORMAL LOW (ref 8–23)
Bicarbonate: 22 mmol/L (ref 22–29)
Calcium: 8.4 mg/dL — ABNORMAL LOW (ref 8.5–10.6)
Chloride: 106 mmol/L (ref 98–107)
Creatinine: 0.62 mg/dL (ref 0.51–0.95)
Glucose: 97 mg/dL (ref 70–99)
Potassium: 3.4 mmol/L — ABNORMAL LOW (ref 3.5–5.1)
Sodium: 140 mmol/L (ref 136–145)
eGFR Based on CKD-EPI 2021 Equation: >60 mL/min/{1.73_m2}

## 2022-03-31 LAB — GLUCOSE (POCT)
Glucose (POCT): 100 mg/dL — ABNORMAL HIGH (ref 70–99)
Glucose (POCT): 140 mg/dL — ABNORMAL HIGH (ref 70–99)
Glucose (POCT): 141 mg/dL — ABNORMAL HIGH (ref 70–99)
Glucose (POCT): 96 mg/dL (ref 70–99)

## 2022-03-31 LAB — MAGNESIUM, BLOOD: Magnesium: 1.7 mg/dL (ref 1.6–2.4)

## 2022-03-31 LAB — VITAMIN D, 25-OH TOTAL: Vitamin D, 25-Hydroxy: 18 ng/mL — ABNORMAL LOW (ref 30–80)

## 2022-03-31 LAB — PHOSPHORUS, BLOOD: Phosphorous: 3.3 mg/dL (ref 2.7–4.5)

## 2022-03-31 MED ORDER — OXYCODONE HCL 5 MG OR TABS
5.0000 mg | ORAL_TABLET | ORAL | Status: DC | PRN
Start: 2022-03-31 — End: 2022-04-03
  Administered 2022-03-31 – 2022-04-01 (×2): 5 mg via ORAL
  Filled 2022-03-31: qty 1

## 2022-03-31 MED ORDER — POLYETHYLENE GLYCOL 3350 OR PACK
17.0000 g | PACK | Freq: Every day | ORAL | Status: DC
Start: 2022-03-31 — End: 2022-04-01
  Administered 2022-03-31: 17 g via ORAL
  Filled 2022-03-31: qty 1

## 2022-03-31 MED ORDER — MELATONIN 5 MG OR TABS
5.0000 mg | ORAL_TABLET | Freq: Every evening | ORAL | Status: DC
Start: 2022-03-31 — End: 2022-04-03
  Administered 2022-03-31 – 2022-04-02 (×3): 5 mg via ORAL
  Filled 2022-03-31 (×3): qty 1

## 2022-03-31 MED ORDER — SODIUM CHLORIDE 0.9% TKO INFUSION
INTRAVENOUS | Status: DC | PRN
Start: 2022-03-31 — End: 2022-04-03

## 2022-03-31 MED ORDER — POTASSIUM CHLORIDE 10 MEQ/100ML IV SOLN
10.0000 meq | INTRAVENOUS | Status: AC
Start: 2022-03-31 — End: 2022-03-31
  Administered 2022-03-31 (×2): 10 meq via INTRAVENOUS
  Filled 2022-03-31 (×2): qty 100

## 2022-03-31 MED ORDER — OXYCODONE HCL 10 MG OR TABS
10.0000 mg | ORAL_TABLET | ORAL | Status: DC | PRN
Start: 2022-03-31 — End: 2022-04-03
  Administered 2022-04-01: 10 mg via ORAL
  Filled 2022-03-31: qty 1

## 2022-03-31 MED ORDER — HYDROXYZINE HCL 10 MG OR TABS
10.0000 mg | ORAL_TABLET | Freq: Once | ORAL | Status: AC
Start: 2022-03-31 — End: 2022-03-31
  Administered 2022-03-31: 10 mg via ORAL
  Filled 2022-03-31: qty 1

## 2022-03-31 MED ORDER — POTASSIUM CHLORIDE CRYS CR 20 MEQ OR TBCR
40.0000 meq | EXTENDED_RELEASE_TABLET | Freq: Once | ORAL | Status: AC
Start: 2022-03-31 — End: 2022-03-31
  Administered 2022-03-31: 40 meq via ORAL
  Filled 2022-03-31: qty 2

## 2022-03-31 MED ORDER — OXYCODONE HCL 5 MG OR TABS
5.0000 mg | ORAL_TABLET | ORAL | Status: DC | PRN
Start: 2022-03-31 — End: 2022-04-03
  Administered 2022-03-31 – 2022-04-02 (×3): 5 mg via ORAL
  Filled 2022-03-31 (×4): qty 1

## 2022-03-31 MED ORDER — MAGNESIUM SULFATE 2 GM/50ML IV SOLN
2.0000 g | INTRAVENOUS | Status: AC
Start: 2022-03-31 — End: 2022-03-31
  Administered 2022-03-31 (×2): 2 g via INTRAVENOUS
  Filled 2022-03-31 (×2): qty 50

## 2022-03-31 NOTE — Plan of Care (Signed)
Problem: Promotion of Health and Safety  Goal: Promotion of Health and Safety  Description: The patient remains safe, receives appropriate treatment and achieves optimal outcomes (physically, psychosocially, and spiritually) within the limitations of the disease process by discharge.    Information below is the current care plan.  Outcome: Progressing  Flowsheets (Taken 03/31/2022 0402)  Individualized Interventions/Recommendations #1: Encourage pt. to utilize call light when needing help to the bathroom. Fall risk precuations in place.  Individualized Interventions/Recommendations #2 (if applicable): Monitor ostomy and empty frequently to prevent leakage.  Individualized Interventions/Recommendations #3 (if applicable): [place] aquired a leak. Fixed with tegaderm. Instruct pt. to be careful ambulating as the tubing can be easily dislodged.  Individualized Interventions/Recommendations #4 (if applicable): Educate pt. on limiting pain medication as it can lead to reduced bowel motility.  Outcome Evaluation (rationale for progressing/not progressing) every shift: A/Ox4 but impulsive and anxious. VSS. Pain is controlled with with PRN Dilaudid. Clear liquid diet. Placed on [facility] for increased work of breathing after ambulating. Drain sites with minimal output. [place] dressing reinforced after a leak was acquired. UA was sent to rule out UTI after increasing confusion was noted. PVR taken and only 44ml on bladder scan.  Note:

## 2022-03-31 NOTE — Plan of Care (Signed)
Problem: Promotion of Health and Safety  Goal: Promotion of Health and Safety  Description: The patient remains safe, receives appropriate treatment and achieves optimal outcomes (physically, psychosocially, and spiritually) within the limitations of the disease process by discharge.    Information below is the current care plan.  Outcome: Progressing  Flowsheets  Taken 03/31/2022 1550 by Fernanda Drum, RN  Outcome Evaluation (rationale for progressing/not progressing) every shift: Pt is AOx4, MAE, NAD. VSS, afebrile. Ostomy reveals large output. Electrolytes replaced. Pt able to get OOB to [facility] several times throughout the day. Pain is tolerable with available PRN orders.  Taken 03/31/2022 0800 by Fernanda Drum, RN  Patient /Family stated Goal: to feel better  Taken 03/31/2022 0402 by Shea Stakes, RN  Individualized Interventions/Recommendations #1: Encourage pt. to utilize call light when needing help to the bathroom. Fall risk precuations in place.  Individualized Interventions/Recommendations #2 (if applicable): Monitor ostomy and empty frequently to prevent leakage.  Individualized Interventions/Recommendations #3 (if applicable): [place] aquired a leak. Fixed with tegaderm. Instruct pt. to be careful ambulating as the tubing can be easily dislodged.  Individualized Interventions/Recommendations #4 (if applicable): Educate pt. on limiting pain medication as it can lead to reduced bowel motility.  Taken 03/30/2022 0504 by Shea Stakes, RN  Individualized Interventions/Recommendations #5 (if applicable): Encourage pt. to adhere to fluid restriction and monitor tolerance of diet.  Taken 03/27/2022 1517 by Marene Lenz, RN  Guidelines: Inpatient Nursing Guidelines  Note:

## 2022-03-31 NOTE — Progress Notes (Signed)
M[facility] Surgery Progress Note    Patient Name: Daliza Yasuda MRN: [id number]    Hospital Day:   6 days - Admitted on: 03/25/2022  Post Operative Day(s): Day of surgery 3/18    HPI: 84F with asymptomatic GERD and history of RUL MAC requiring lobectomy as well as symtpomatic parastomal hernia. S/p repair of recurrent incisional/ventral hernia, repair of parastomal hernia with mobilization of the left rectus muscle w/ myofascial flap component separation w/ mesh, as well as ostomy takedown w/ creation of diverting loop ostomy w/ Dr. Hillery Hunter and Dr. Astrid Divine 3/18    Events/Subjective:  - NAEO, AFVSS  - had some incontinence and dysuria, UA negative  - loud neighbor caused anxiety overnight  - feels better this morning, but still slightly anxious and worried about last evening's disturbance    Vitals signs:  Latest entry:   Temperature: 98.3 F (36.8 C)  Heart Rate: 73  Respirations: 18  Blood pressure (BP): 144/68  SpO2: 96 %  Weight: 67.9 kg (149 lb 12.8 oz)  Percentage Weight Change (%): 7.86 %    Temperature:  [98 F (36.7 C)-100.7 F (38.2 C)] 98.3 F (36.8 C) (03/24 0700)  Blood pressure (BP): (134-160)/(64-80) 144/68 (03/24 0700)  Heart Rate:  [70-77] 73 (03/24 0700)  Respirations:  [16-22] 18 (03/24 0700)  Pain Score: 5 (03/24 0500)  O2 Device: None (Room air) (03/24 0500)  O2 Flow Rate (L/min):  [2 l/min] 2 l/min (03/23 2316)  SpO2:  [92 %-97 %] 96 % (03/24 0700)    Intake/Output                         03/30/22 0600 - 03/31/22 0559 03/31/22 0600 - 04/01/22 0559     [id number] 1800-0559 Total 0600-1759 1800-0559 Total                 Intake    P.O.  220  250 470  --  -- --    Total Intake 220 250 470 -- -- --       Output    Urine  300  650 950  --  -- --    Drains  13  -- 13  10  -- 10    Stool  250  250 500  --  -- --    Total Output [phone] 10 -- 10       Net I/O     -343 -650 -993 -10 -- -10            Patient Lines/Drains/Airways Status       Active PICC Line / CVC Line / PIV Line / Drain / Airway  / Intraosseous Line / Epidural Line / ART Line / Line Type / Wound / Pressure Ulcer Injury       Name Placement date Placement time Site Days    Peripheral IV - 20 G Left Wrist 03/25/22  0741  Wrist  6    Peripheral IV - 20 G Right Hand 03/29/22  1530  Hand  1    Closed/Suction Drain -  Lateral RLQ 03/25/22  1327  RLQ  5    Closed/Suction Drain -  Lateral LLQ 03/25/22  1327  LLQ  5    Colostomy -  LLQ 03/20/21  --  LLQ  376    Ileostomy -  Loop ileostomy RLQ 03/25/22  1400  RLQ  5    Wound Vac -  Surgical Medial Abdomen  03/25/22  1344  -- 5    Incision -  03/25/22 0942 Abdomen Upper 03/25/22  0942  -- 5                    Diet Diet Clear Liquid; Zero Carbohydrate Clear Liquid No carbonated beverages    No data recorded     Labs:  CBC  Recent Labs     03/30/22  0601 03/31/22  0525   WBC 4.2 4.4   HGB 9.2* 9.5*   HCT 27.4* 27.5*   PLT 242 258   SEG  --  64   LYMPHS  --  18   MONOS  --  13         Chemistry  Recent Labs     03/30/22  0601 03/31/22  0525   NA 141 140   K 3.6 3.4*   CL 109* 106   BICARB 23 22   BUN 9 7*   CREAT 0.60 0.62   GLU 142* 97   [place] 8.1* 8.4*   MG 1.9 1.7   PHOS 2.7 3.3         No results for input(s): "ALK", "AST", "ALT", "TBILI", "DBILI", "ALB" in the last 72 hours.    Coags  No results for input(s): "PT", "PTT", "INR" in the last 72 hours.    Radiology:   X-Ray Abdomen Post Tube Placement    Result Date: 03/26/2022  IMPRESSION: 1. Suction-type enteric tube, coursing below the diaphragm, with side-port projecting over the at or just beyond the GE junction , with tip projecting over the gastric fundus. 2. Partially imaged surgical drain projects over the left lower quadrant of the abdomen. 3. Nonobstructive bowel gas pattern.      Microbiology:  Microbiology Results (last 30 days)       Procedure Component Value - Date/Time    Methicillin Resistant Staphylococcus aureus (MRSA), Surveillance, Nasal Angela Burke Nares [id number] Collected: 03/25/22 2103    Lab Status: Final result Specimen: Nares  Updated: 03/27/22 1259     MRSA Surveillance Result No Methicillin Resistant Staphylococcus aureus isolated.              Physical Exam:  General Appearance: healthy, alert, no distress, pleasant affect, cooperative, cooperative.  Heart:  normal rate and regular rhythm, no murmurs, clicks, or gallops.  Lungs: clear to auscultation and percussion, no chest deformities noted and Equal chest rise bilaterally.  L abdominal drain w/ serosanguinous drainage  R abdominal drain w/ serosanguinous fluid. Midline incision w/ incisional WV (to suction), midline incision c/d/I with no strikethrough. Abdomen appropriately tender . Old LUQ ostomy site w/ prevena vac to seal, R ileostomy pouched & pink, red rubber present, productive with stool and gas   Foley catheter w/ yellow urine   Extremities:  no cyanosis, clubbing, or edema.    Medications:  IV Meds   dextrose-sodium chloride 5%-0.9% 75 mL/hr at 03/30/22 1319     Scheduled Meds   Amikacin Sulfate Liposome  590 mg Q24H    atorvastatin  10 mg Daily    azithromycin  250 mg Daily    cholecalciferol  5,000 Units Daily    CLOFAZIMINE IRB 21-0063 50 MG CAPSULE 100 mg  100 mg Daily    enoxaparin  40 mg Daily    ethambutol  800 mg Daily    ferrous sulfate  324 mg Daily    fluoxetine  40 mg Daily    insulin glargine  0.1 Units/kg Daily  at [facility]    insulin regular  0-10 Units Q6H    lidocaine  2 patch Q24H    loratadine  10 mg Daily    melatonin  5 mg HS    pantoprazole  40 mg Daily    rifAMPin  600 mg Daily    salmeterol  1 puff Q12H    tiotropium  1 capsule Daily     PRN Meds   albuterol  2 puff Q6H PRN    dextrose  12.5 g PRN    diphenhydrAMINE  25 mg Q4H PRN    glucagon  1 mg Once PRN    glucose  4 tablet PRN    glucose  1 Tube PRN    HYDROmorphone  0.5 mg Q4H PRN    HYDROmorphone  0.5 mg Q4H PRN    HYDROmorphone  1 mg Q4H PRN    menthol  1 lozenge Q2H PRN    nalOXone  0.1 mg Q2 Min PRN    phenol  1 spray Q2H PRN    sodium chloride  4 mL Q12H PRN         ASSESSMENT /  PLAN:    [name] with history of GERD, parastomal hernia, 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 Hillery Hunter and Dr. Astrid Divine 3/18.     Recovering appropriately.     Neuro: Multi-modal pain control   Tylenol 1060m IV scheduled q8  Lidocaine patches q12 prn  Switch to PO pain meds  Benadryl 25mg  IV q4 prn for itching    CV: Atorvastatin  Pulmonary: out of bed, incentive spirometry, maintaining saturations on room air  Encourage IS every hour while awake  CXR this morning    #MAC lung disease  Continue home [place] nebulizer   Azithromycin 250mg  PO daily  Clofazimine 100mg  BID  Ethambutol 800mg  daily    Rifampin 600mg  daily  Salmeterol 1 puff q12  Albuterol 2 puff q6 prn    GI:   S/p NG tube removal   Tolerating clear diet, will advance to low fiber regular diet  Continue PPI  Bowel regimen    FEN:  mIVF. Monitor and Replete electrolytes as needed.    GU/Renal: Strict In's and Out's.  #Hyperkalemia secondary to low volume depletion  K 5.7 EKG wnl, no abnormalities. S/p 1g calcium gluconate. 1L bolus  K and Cr now stable  Replete lytes PRN  Obtain PVRs today    ID: Afebrile. WBC 4.4    Hematology: Hgb 9.5  lovenox  Drain to bulb suction, monitor output, removed subcutaneous drain today  Keep drain in pelvis in RLQ    Prophylaxis: Pantoprazole IV. Prophylatic Lovenox sub-cutaneous, SCDs while in bed.   PT/OT: Ambulate with assist QID; OOB    The patient was examined and discussed with chief resident and team and seen with  Dr. Astrid Divine , who agrees with the above assessment and plan.    Shellee Milo, MD  General Surgery PGY3

## 2022-04-01 ENCOUNTER — Encounter (HOSPITAL_COMMUNITY): Payer: Self-pay | Admitting: General Surgery

## 2022-04-01 LAB — BASIC METABOLIC PANEL, BLOOD
Anion Gap: 10 mmol/L (ref 7–15)
BUN: 7 mg/dL — ABNORMAL LOW (ref 8–23)
Bicarbonate: 22 mmol/L (ref 22–29)
Calcium: 8.3 mg/dL — ABNORMAL LOW (ref 8.5–10.6)
Chloride: 108 mmol/L — ABNORMAL HIGH (ref 98–107)
Creatinine: 0.64 mg/dL (ref 0.51–0.95)
Glucose: 125 mg/dL — ABNORMAL HIGH (ref 70–99)
Potassium: 4 mmol/L (ref 3.5–5.1)
Sodium: 140 mmol/L (ref 136–145)
eGFR Based on CKD-EPI 2021 Equation: >60 mL/min/{1.73_m2}

## 2022-04-01 LAB — CBC WITH DIFF, BLOOD
ANC-Automated: 3.1 10*3/uL (ref 1.6–7.0)
Abs Basophils: 0 10*3/uL (ref <0.2)
Abs Eosinophils: 0.3 10*3/uL (ref 0.0–0.5)
Abs Lymphs: 0.9 10*3/uL (ref 0.8–3.1)
Abs Monos: 0.5 10*3/uL (ref 0.2–0.8)
Basophils: 1 %
Eosinophils: 5 %
Hct: 28 % — ABNORMAL LOW (ref 34.0–45.0)
Hgb: 9.5 gm/dL — ABNORMAL LOW (ref 11.2–15.7)
Lymphocytes: 19 %
MCH: 31.4 pg (ref 26.0–32.0)
MCHC: 33.9 g/dL (ref 32.0–36.0)
MCV: 92.4 um3 (ref 79.0–95.0)
MPV: 9 fL — ABNORMAL LOW (ref 9.4–12.4)
Monocytes: 11 %
Plt Count: 263 10*3/uL (ref 140–370)
RBC: 3.03 10*6/uL — ABNORMAL LOW (ref 3.90–5.20)
RDW: 13.2 % (ref 12.0–14.0)
Segs: 64 %
WBC: 4.8 10*3/uL (ref 4.0–10.0)

## 2022-04-01 LAB — GLUCOSE (POCT)
Glucose (POCT): 123 mg/dL — ABNORMAL HIGH (ref 70–99)
Glucose (POCT): 209 mg/dL — ABNORMAL HIGH (ref 70–99)
Glucose (POCT): 171 mg/dL — ABNORMAL HIGH (ref 70–99)
Glucose (POCT): 137 mg/dL — ABNORMAL HIGH (ref 70–99)

## 2022-04-01 LAB — PHOSPHORUS, BLOOD: Phosphorous: 3.2 mg/dL (ref 2.7–4.5)

## 2022-04-01 LAB — MAGNESIUM, BLOOD: Magnesium: 2.1 mg/dL (ref 1.6–2.4)

## 2022-04-01 MED ORDER — LANSOPRAZOLE 30 MG OR CPDR
30.0000 mg | DELAYED_RELEASE_CAPSULE | Freq: Every day | ORAL | Status: DC
Start: 2022-04-01 — End: 2022-04-03
  Administered 2022-04-01 – 2022-04-03 (×3): 30 mg via ORAL
  Filled 2022-04-01 (×3): qty 1

## 2022-04-01 MED ORDER — MAGNESIUM OXIDE 400 MG OR TABS
400.0000 mg | ORAL_TABLET | Freq: Every day | ORAL | Status: AC
Start: 2022-04-01 — End: 2022-04-01
  Administered 2022-04-01: 400 mg via ORAL
  Filled 2022-04-01: qty 1

## 2022-04-01 MED ORDER — POTASSIUM CHLORIDE 20 MEQ OR PACK
20.0000 meq | PACK | Freq: Once | ORAL | Status: AC
Start: 2022-04-01 — End: 2022-04-01
  Administered 2022-04-01: 20 meq via ORAL
  Filled 2022-04-01: qty 1

## 2022-04-01 MED ORDER — ACETAMINOPHEN 325 MG PO TABS
650.0000 mg | ORAL_TABLET | Freq: Three times a day (TID) | ORAL | Status: DC
Start: 2022-04-01 — End: 2022-04-03
  Administered 2022-04-01 – 2022-04-03 (×7): 650 mg via ORAL
  Filled 2022-04-01 (×7): qty 2

## 2022-04-01 NOTE — Consults (Addendum)
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 S/p repair of recurrent incisional/ventral hernia, repair of parastomal hernia with mobilization of the left rectus muscle w/ myofascial flap component separation w/ mesh, as well as ostomy takedown w/ creation of diverting loop ostomy w/ Dr. Hillery Hunter and Dr. Astrid Divine 3/18.     Patient has been managing her colostomy independently for years.     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       CBC  Recent Labs     03/30/22  0601 03/31/22  0525 04/01/22  0647   WBC 4.2 4.4 4.8   RBC 2.93* 2.99* 3.03*   HGB 9.2* 9.5* 9.5*    HCT 27.4* 27.5* 28.0*   MCV 93.5 92.0 92.4   MCH 31.4 31.8 31.4   MCHC 33.6 34.5 33.9   RDW 12.7 12.8 13.2   MPV 9.5 9.4 9.0*   PLT 242 258 263   SEG  --  64 64   LYMPHS  --  18 19   MONOS  --  13 11   EOS  --  4 5   BASOS  --  1 1       Chemistry  Recent Labs     03/30/22  0601 03/31/22  0525 04/01/22  0647   NA 141 140 140   K 3.6 3.4* 4.0   CL 109* 106 108*   BICARB 23 22 22    BUN 9 7* 7*   CREAT 0.60 0.62 0.64   GLU 142* 97 125*   [place] 8.1* 8.4* 8.3*   MG 1.9 1.7 2.1   PHOS 2.7 3.3 3.2       LFTs  No results for input(s): "ALK", "AST", "ALT", "TBILI", "DBILI", "ALB" in the last 72 hours.    Coags  No results for input(s): "PT", "PTT", "INR" in the last 72 hours.    No results for input(s): "PREALB" in the last 2160 hours.    Recent Labs     03/28/22  0526   A1C 6.3*       No results found for: "BLOODCULT"    Surgical History  Past Surgical History:   Procedure Laterality Date    BRONCHOSCOPY  12/2020    CESAREAN SECTION, LOW TRANSVERSE      COLOSTOMY      CRANIOTOMY      LUNG LOBECTOMY         Current Nutrition: Diet Type: Regular    Diet Status: PO           Ostomy Stool Output past 24 hours:   Output by Drain (mL) 03/31/22 0600 - 03/31/22 1759 03/31/22 1800 - 04/01/22 0559 04/01/22 0600 - 04/01/22 1018 Range Total   Ileostomy -  Loop ileostomy RLQ 300 150 150 600       Stoma: measures appx 32 mm. Well budded with smaller limb to distal aspect. Beefy red with liquid stool in the pouch. Peristomal skin intact, near midline incision. Was shown how to offset the appliance to avoid the incision.   Resident has just removed the provena vac and red robbin.     Psych/Social Concerns: none, patient has good family support and is very adept at managing her stoma with a positive attitude.     Pouching instructions:  OSTOMY POUCHING INSTRUCTIONS      Gather supplies:  washcloths, adhesive remover, skin prep, barrier ring, or paste, wafer, pouch, belt (if used) trash bag, gloves if needed     Wash hands.  Open all  supplies to have ready to use.  Clean skin around stoma with warm water, no soap. Let dry.  Gently ease off wafer/pouch with adhesive remover.   Measure the stoma at the base, trace the measurement onto the wafer, and cut the wafer along the tracing.     If needed, use the barrier ring (Adapt),  stretch to fit stoma and place around stoma.  It should be snug and touching stoma so no skin is showing.  Center the wafer over the stoma and press to skin. Use finger to push down barrier immediately under the stoma, 3 and 9 o'clock edges to prevent any gaps between the skin and wafer. Smooth wafer to skin. Attach pouch to wafer make sure snapped in place completely.  Use belt for support (optional)  8.  Palm pressure at least 10 minutes on top of stoma while lying flat.  This helps to warm the appliance and adhere.    EMPTY POUCH WHEN 1/3 TO 1/2 FULL. CHANGE ENTIRE APPLIANCE EVERY 3-5 DAYS.     -Stoma powder & Skin prep: Only use if skin has broken down. Stop using once skin has healed-  -Pouch is waterproof. Ok to shower with pouch on and do not need to cover-    Follow up with Outpatient Ostomy Nurse for stoma or appliance  issues:    Certified Wound Ostomy Continence Nurses  [facility]  [phone]    Clinic Locations:  [facility] [phone])  [address]  [place], [place] [address]    [facility] in [place] [phone])  [address]   [address] [place], [place] [address]      Ostomy Supplies for Discharge:    Supplies for home (case manager to order) :     Appliances: [employer] [phone]   20 per month x 11 months refill (or [facility]S A4409)    Pouches:  [employer]  [phone]        20 per month x 11 months refill (or [facility]PCS A4425)  Barrier rings: Adapt barrier rings [phone] -  20 per month  x 11 months refill (or [facility]S A4385)    No sting skin prep: any brand -   one box per month x 11 months refill (or HCPCS A5120)    Adhesive  remover: any brand -   one box per month x 11 months refill (or [facility]S A4456)    Ostomy belt:      [employer] medium -  #7300    one per month  x 11 months refill (or HCPCS [id number])    Ostomy powder: Adapt 7906-  one per month x 11 months refill (or HCPCS A4371)    Lubricating deodorant:  Adapt    one bottle per month  x 11 months refill (or HCPCS A4394)    *Please use equivalent if not available or refer to the HCPCS code above*      Patient/Family Education: All aspects of ostomy management and care discussed primarily focusing on differences with ileostomy vs colostomy. We discussed risks of dehydration, food blockages and hygiene, pouch changes and emptying. Patient was able to return demonstrate pouch emptying. Did express interest in ostomy 48 participation, will share information on this. Does currently have an ostomy supplier but did not remember the name. Will find out.     Findings and recommendations communicated to: RN, case Freight forwarder. Patient signed up for [facility] Secure start and also given supplies for home.     Plan: Bedside RN to continue teaching and Please have patient practice emptying pouch    Wound Care nurse: Alonna Buckler Shakina Choy, RN, Certified Wound and Ostomy Nurse

## 2022-04-01 NOTE — Interdisciplinary (Addendum)
Ostomy supply list received from Wound nurse.  Pt unable to recall vendor name.  CM called Pt's sister, Stanton Kidney, [place] left with request for call-back.  Referral sent to [facility].  Vendor will be able to run benefits and check current vendor.

## 2022-04-01 NOTE — Plan of Care (Signed)
Problem: Promotion of Health and Safety  Goal: Promotion of Health and Safety  Description: The patient remains safe, receives appropriate treatment and achieves optimal outcomes (physically, psychosocially, and spiritually) within the limitations of the disease process by discharge.    Information below is the current care plan.  Outcome: Progressing  Flowsheets  Taken 04/01/2022 0358  Individualized Interventions/Recommendations #1: Educate patient on s/s of infection  Individualized Interventions/Recommendations #2 (if applicable): Educate patient on foods high in protein  Outcome Evaluation (rationale for progressing/not progressing) every shift: Pt A&Ox4. VSS. Reports pain in abd with movement, relieved with oxycodone. Tolerating RA. Tolerating low fiber diet. Refer to flowsheets for output of lieostomy. Producing dark brown/black completely liquid stool. Pt reports having small bm's throughout the day. Writer assessed BM from rectum and it seems to be a small mucousy reddish brown stool. Continent of bladder. Wound vac to midline incision at 169mmHg continuous. JP drain to RLQ WNL. Up w/1 SBA. Pt states understanding of s/s of infection and foods high in protein. Call light within reach. Plan of care ongoing  Taken 03/31/2022 2015  Patient /Family stated Goal: pain management  Note:

## 2022-04-01 NOTE — Op Note (Signed)
DATE OF SERVICE:    03/25/2022    Name:  Tiffany Chen    MRN:  [id number]    DOB:  12-19-1945    PREOPERATIVE DIAGNOSIS:  End colostomy, parastomal hernia     POSTOPERATIVE DIAGNOSIS:  End colostomy, parastomal hernia     PROCEDURE PERFORMED:   Open colostomy takedown with low anterior resection and colorectal anastomosis, open splenic flexure mobilization, creation of diverting loop ileostomy, lysis of adhesions, flexible sigmoidoscopy, intraoperative bowel perfusion assessment      SURGEON/STAFF:  Astrid Divine, MD     EBL:  200 mL.     ANESTHESIA:  General endotracheal.     SPECIMEN:  Rectum, colon resection with colostomy     INDICATIONS:  This is a 77 year old female patient who has a history of open Hartman procedure for perforated diverticulitis.  Subsequently she's developed a large parastomal hernia containing the majority of her colon.  Patient states she had a colonoscopy approximately 7-10 years ago.  We discussed that a preoperative colonoscopy would typically be indicated but considering the angulation/tortuosity of the colon within the contained parastomal hernia sac, I would not recommend a colonoscopy as it would be a high risk for perforation.  The patient states her quality of life is poorly affected by the enlarging parastomal hernia and would like to proceed with colostomy takedown.  The patient agreed to undergo an open colostomy takedown with colorectal anastomosis, possible diverting loop ileostomy       DESCRIPTION OF PROCEDURE:  The risks, benefits, and alternatives to  the operation were discussed with the patient.  The risks included,  but not limited to, bleeding, wound infection, intraabdominal  infection requiring percutaneous drainage or surgical drainage,  injury to adjacent organ structures including kidney, ureter,  bladder, small intestine, large intestine, vascular structures,  incisional hernia, bowel obstruction, anastomotic leak requiring  percutaneous drainage or  surgical reoperation, stoma creation, heart  attack, stroke, death, and anesthesia-related complications.  The  patient agreed to the operation and signed the informed consent.      The patient was taken to the operating room, placed in the supine  position on operating room table.  SCDs were placed on bilateral  lower extremities and machine was turned on.  A peripheral IV was  inserted by Anesthesia followed by IV sedation and induction   medications, successful smooth endotracheal intubation.  The patient  was then placed in a modified lithotomy position.  All bony  prominences and pressure points were adequately padded.  A Foley  catheter was placed under sterile conditions draining clear yellow  urine.  The abdomen was then prepped and draped in  normal sterile fashion.  A time-out was performed identifying the  patient by name, date of birth, MR number, and procedure to be  performed.  The patient received IV antibiotics, heparin subcu, and  Entereg before incision.  A flexible sigmoidoscopy was performed and showed a health rectal stump with mild diversion colitis.  The apex of the rectal stump at 12 cm from the anal verge had a stricture that couldn't be traversed with the CF scope, without associated mucosal abnormality.  The scope was removed.  A circumferential incision was made at the mucocutaneous junction of the colostomy.  The colostomy was mobilized from the subcutaneous tissue and fascia sharply.  The colostomy was then pursestring closed and placed back into the abdomen.  The patient's prior midline was utilized and incised.  Dissection through the subcutaneous tissue was done with  Bovie electrocautery and the fascia exposed.  The fascia was incised sharply and the peritoneal cavity entered safely.   There were dense adhesions of the omentum to the anterior abdominal wall which were taken down sharply.  The fascia was then extended the length of hte skin incision.  Numerous small bowel loops were  adhesed to the left pelvic sidewall and rectal stump and these were taken down sharply and safely.  We then placed the patient in Trendelenburg and  left-side-up position.  The small bowel was mobilized to the right  upper quadrant.  The rectal stump was visualized, and to be severely scarred and retracted in the pelvis, well below the sacral promontory.  The rectal stump staple line was grasped  and retracted, and the presacral space was entered at the  sacral promontory under  direct visualization and protection of the left ureter.   The EEA 25 sizer was then placed into the rectum and was unable to reach the apex of the rectal stump due to previously identified stricture.  We  then commenced with our pelvic dissection where the posterior  presacral space was developed under direct visualization and  protection of the hypogastric nerves.  The lateral stalks were  incised as well.  The anterior peritoneal reflection was incised and the rectovaginal septum dissected under direct visualization and protection of the vagina.  This  TME dissection to the mid/distal rectum allowed [place] adequate advancement to a healthy and non-strictured portion of rectum.  The mesorectum was cleaned at this point, and the  Echelon 60 mm green load stapler was then placed, closed, and fired at this clean  portion of the rectum.  Once this was done, the pelvis was irrigated  and found to be hemostatic.      Unusual Procedure  Dense omental and small bowel adhesions and a severely scarred and retracted rectal stump were encountered and significantly increased the complexity and length of time of the operation.   An additional 1.5 hours were spent       We then incised  the white line of Toldt from the pelvic inlet up to the spleen.  The lesser sac was entered at the mid transverse colon by separating the omentum from the anti-mesenteric portion of the transverse colon.  The omentum was serially ligated and divided to the meet the lateral  dissection plane, completely mobilizing and medializing the splenic flexure.  The transverse mesocolic attachments to the inferior border of the pancreas were also incised.  A point in the sigmoid colon proximal to the colostomy was chosen, and the mesenteric  window was created and the intervening mesentery was ligated and  divided with the LigaSure device.  We then utilized fluorescence ICG  angiography with Pinpoint technology, which showed rapid and robust uptake to the planned point  of transection and resection.  An EEA 28 anvil was then placed in the colotomy stem first and pierced the anti-mesenteric portion of the proximal sigmoid colon 4 cm from the planned point of resection and transection.  A 3-0 vicryl pursestring was then placed to secure the anvil.  An Echelon 60 mm blue load stapler was then placed across the cleaned portion of colon.  The EEA 28 stapler was then placed  into the anus up to the level of the rectal staple line, and the spike was  deployed.  The spike and the anvil were married, and the stapler was  closed ensuring there was no incorporation of surrounding structures.  The orientation of the proximal bowel was checked and found to be  appropriate with no evidence of kinking or twisting.  The stapler was  then closed and fired creating a tension-free, well-vascularized,  side-to-end colorectal anastomosis, 7 cm from the anal verge.  The stapler was then removed and the donuts  were checked and found to be complete and robust.  The pelvis was  irrigated and the proximal bowel was occluded, and the flexible  sigmoidoscope was then placed into the rectum and visualized an  intact and patent anastomosis with no evidence of hematoma or active  bleeding.  There was a negative air leak test.  The scope was then  removed.  The irrigation was then suctioned with no evidence of active bleeding, bile or succus.  A 19 Fr channeled JP drain was placed into the pelvis adjacent to the anastomosis.   A portion of distal ileum, 20 cm proximal the ileocecal valve was chosen and a pre-marked right sided ileostomy site was utilized.  A muscle sparing, circular incision was created, greater than 2 finger-breadths, and the chosen portion of distal ileum brought through without difficulty.  The orientation was checked and found to be appropriate, without kinking or twisting and a straight mesentery.  The ileostomy was then matured in a Brooke type fashion with interrupted 3-0 vicryl sutures.  Hemostasis was excellent.  Dr. Hillery Hunter will dictate separately the closure of midline fascia and parastomal hernia.  I was  present the entire case.  All instrument counts and sponge counts  reported as correct at the end of the case.  The patient tolerated  the procedure well, was extubated in the operating room and  transferred to the PACU in stable condition.

## 2022-04-01 NOTE — Discharge Instructions (Addendum)
Ostomy support:       1.    [facility] Ostomy Support Group  Has donated supplies, may be able to get supplies to you if in [facility]; supplies have to be picked up. Will need to know your ostomy type and size. Call Newman Nickels (ostomate volunteer):[phone] ;  [email or web address]   2.    [employer]:  Data processing manager product assistance program:   The patient themselves need to call [phone] option 6. Spanish interpreter if needed.  3.    [employer] - Access Program  Call [phone]  Will send out samples (3 pouch system only) first to determine what works, then will send out a 3 months supplies (Pt has to apply for the program annually).  4. [facility], call [phone]   5. [employer]:    Will provide ostomy products to uninsured, under insured patients. Patient pays shipping and handling only. They also take donations of unused ostomy supplies.  [employer] [phone] (Dawnette and I both called several times, and the phone was busy all the time, not sure if this # is still correct).     6.    [employer]  Support group across the border - plus [employer].  [email or web address]   Meets at [place]. [address], [place], [place]  Information and resources with free virtual lifestyle classes  [email or web address]

## 2022-04-01 NOTE — Progress Notes (Signed)
MIS Surgery Progress Note    Patient Name: Tiffany Chen MRN: [id number]    Hospital Day:   7 days - Admitted on: 03/25/2022  Post Operative Day(s): Day of surgery 3/18    HPI: 2F with asymptomatic GERD and history of RUL MAC requiring lobectomy as well as symtpomatic parastomal hernia. S/p repair of recurrent incisional/ventral hernia, repair of parastomal hernia with mobilization of the left rectus muscle w/ myofascial flap component separation w/ mesh, as well as ostomy takedown w/ creation of diverting loop ostomy w/ Dr. Hillery Hunter and Dr. Astrid Divine 3/18    Events/Subjective:  - NAEO, AFVSS  - Feeing  good this AM, was able to sleep last night  - Tolerating diet without nausea or vomiting, however low intake recorded  - Ileostomy with thickened texture stool output  - subcutaneous drain removed 3/24    Vitals signs:  Latest entry:   Temperature: 97.8 F (36.6 C)  Heart Rate: 68  Respirations: 18  Blood pressure (BP): 139/60  SpO2: 95 %  Weight: 67.9 kg (149 lb 12.8 oz)  Percentage Weight Change (%): 7.86 %    Temperature:  [97.8 F (36.6 C)-98.6 F (37 C)] 97.8 F (36.6 C) (03/25 0400)  Blood pressure (BP): (139-152)/(60-73) 139/60 (03/25 0400)  Heart Rate:  [64-80] 68 (03/25 0400)  Respirations:  [16-18] 18 (03/25 0400)  Pain Score: Patient Sleeping, Respiratory Assessment Done (03/25 0041)  O2 Device: None (Room air) (03/24 2015)  SpO2:  [95 %-99 %] 95 % (03/25 0400)    Intake/Output                         03/31/22 0600 - 04/01/22 0559 04/01/22 0600 - 04/02/22 0559     [id number] 1800-0559 Total 0600-1759 1800-0559 Total                 Intake    P.O.  250  -- 250  --  -- --    Total Intake 250 -- 250 -- -- --       Output    Urine  1100  700 1800  --  -- --    Drains  15  0 15  0  -- 0    Stool  1100  150 1250  --  -- --    Total Output 2215 850 3065 0 -- 0       Net I/O     -1965 -850 -2815 0 -- 0            Patient Lines/Drains/Airways Status       Active PICC Line / CVC Line / PIV Line / Drain /  Airway / Intraosseous Line / Epidural Line / ART Line / Line Type / Wound / Pressure Ulcer Injury       Name Placement date Placement time Site Days    Peripheral IV - 20 G Left Wrist 03/25/22  0741  Wrist  6    Peripheral IV - 20 G Right Hand 03/29/22  1530  Hand  2    Closed/Suction Drain -  Lateral RLQ 03/25/22  1327  RLQ  6    Colostomy -  LLQ 03/20/21  --  LLQ  377    Ileostomy -  Loop ileostomy RLQ 03/25/22  1400  RLQ  6    Wound Vac -  Surgical Medial Abdomen 03/25/22  1344  -- 6    Incision -  03/25/22 0942 Abdomen Upper 03/25/22  0942  -- 6                    Diet Diet Therapeutic; Low Fiber    No data recorded     Labs:  CBC  Recent Labs     03/31/22  0525 04/01/22  0647   WBC 4.4 4.8   HGB 9.5* 9.5*   HCT 27.5* 28.0*   PLT 258 263   SEG 64 64   LYMPHS 18 19   MONOS 13 11       Chemistry  Recent Labs     03/30/22  0601 03/31/22  0525   NA 141 140   K 3.6 3.4*   CL 109* 106   BICARB 23 22   BUN 9 7*   CREAT 0.60 0.62   GLU 142* 97   [place] 8.1* 8.4*   MG 1.9 1.7   PHOS 2.7 3.3       No results for input(s): "ALK", "AST", "ALT", "TBILI", "DBILI", "ALB" in the last 72 hours.    Coags  No results for input(s): "PT", "PTT", "INR" in the last 72 hours.    Radiology:   X-Ray Abdomen Post Tube Placement    Result Date: 03/26/2022  IMPRESSION: 1. Suction-type enteric tube, coursing below the diaphragm, with side-port projecting over the at or just beyond the GE junction , with tip projecting over the gastric fundus. 2. Partially imaged surgical drain projects over the left lower quadrant of the abdomen. 3. Nonobstructive bowel gas pattern.      Microbiology:  Microbiology Results (last 30 days)       Procedure Component Value - Date/Time    Methicillin Resistant Staphylococcus aureus (MRSA), Surveillance, Nasal Angela Burke Nares [id number] Collected: 03/25/22 2103    Lab Status: Final result Specimen: Nares Updated: 03/27/22 1259     MRSA Surveillance Result No Methicillin Resistant Staphylococcus aureus isolated.               Physical Exam:  General Appearance: healthy, alert, no distress, pleasant affect, cooperative, cooperative.  Heart:  normal rate and regular rhythm, no murmurs, clicks, or gallops.  Lungs: clear to auscultation and percussion, no chest deformities noted and Equal chest rise bilaterally.  L abdominal drain (removed 3/24).  R abdominal drain w/ serosanguinous fluid. Midline incision w/ incisional WV (to suction). Abdomen appropriately tender . Old LUQ ostomy site w/ prevena vac to seal, R ileostomy pouched & pink, red rubber present, productive with stool and gas   Foley catheter w/ yellow urine   Extremities:  no cyanosis, clubbing, or edema.    Medications:  IV Meds   sodium chloride       Scheduled Meds   Amikacin Sulfate Liposome  590 mg Q24H    atorvastatin  10 mg Daily    azithromycin  250 mg Daily    cholecalciferol  5,000 Units Daily    CLOFAZIMINE IRB 21-0063 50 MG CAPSULE 100 mg  100 mg Daily    enoxaparin  40 mg Daily    ethambutol  800 mg Daily    ferrous sulfate  324 mg Daily    fluoxetine  40 mg Daily    insulin glargine  0.1 Units/kg Daily at Noon    insulin regular  0-10 Units Q6H    lidocaine  2 patch Q24H    loratadine  10 mg Daily    melatonin  5 mg HS    pantoprazole  40 mg Daily  polyethylene glycol  17 g Daily    rifAMPin  600 mg Daily    salmeterol  1 puff Q12H    tiotropium  1 capsule Daily     PRN Meds   albuterol  2 puff Q6H PRN    dextrose  12.5 g PRN    glucagon  1 mg Once PRN    glucose  4 tablet PRN    glucose  1 Tube PRN    menthol  1 lozenge Q2H PRN    nalOXone  0.1 mg Q2 Min PRN    oxyCODONE  5 mg Q4H PRN    Or    oxyCODONE  10 mg Q4H PRN    oxyCODONE  5 mg Q4H PRN    phenol  1 spray Q2H PRN    sodium chloride   Continuous PRN    sodium chloride  4 mL Q12H PRN         ASSESSMENT / PLAN:    [name] with history of GERD, parastomal hernia, 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 Hillery Hunter and Dr. Astrid Divine 3/18.     Recovering appropriately.     Neuro: Multi-modal pain control   Scheduled tylenol  Lidocaine patches q12 prn  Continue PO pain meds  Benadryl 25mg  IV q4 prn for itching    CV: Atorvastatin    Pulmonary: out of bed, incentive spirometry, maintaining saturations on room air  Encourage IS every hour while awake      #MAC lung disease  Continue home Arikayce nebulizer   Azithromycin 250mg  PO daily  Clofazimine 100mg  BID  Ethambutol 800mg  daily    Rifampin 600mg  daily  Salmeterol 1 puff q12  Albuterol 2 puff q6 prn    GI:   Continue low res diet as tolerated  Continue PPI  Bowel regimen  Ostomy education, remove red rubber  Will plan to remove prevena today and pack previous ostomy site    FEN:  mIVF. Monitor and Replete electrolytes as needed.    GU/Renal: Strict In's and Out's.  #Hyperkalemia secondary to low volume depletion  K 5.7 EKG wnl, no abnormalities. S/p 1g calcium gluconate. 1L bolus  K and Cr now stable  Replete lytes PRN    ID: Afebrile. WBC 4.8    Hematology: Hgb 9.5  lovenox  Drain to bulb suction, monitor output, RLQ drain in pelvis to bulb suction. Will plan to remove prior to discharge    Prophylaxis: Pantoprazole IV, transition to PO lansoprazole. Prophylactic Lovenox sub-cutaneous, SCDs while in bed.     PT/OT: Ambulate with assist QID; OOB    The patient was examined and discussed with chief resident and team and seen with  Dr. Astrid Divine , who agrees with the above assessment and plan.    Sindy Guadeloupe, NP  Surgery [phone]

## 2022-04-01 NOTE — Interdisciplinary (Signed)
Occupational Therapy Daily Treatment Note    Admitting Physician:  Diona Browner, *  Admission Date 03/25/2022    Inpatient Diagnosis:   Problem List         Codes    Impaired mobility and ADLs    -  Primary ICD-10-CM: Z74.09, Z78.9  ICD-9-CM: V49.89    Relevant Orders    Home Physical Therapy Evaluation (Completed)    Home Occupational Therapy Evaluation (Completed)    Home Nursing Visit for Medication and/or Safety Check (Completed)    Impaired mobility     ICD-10-CM: Z74.09  ICD-9-CM: 799.89    Ileostomy in place (CMS-HCC)     ICD-10-CM: Z93.2  ICD-9-CM: V44.2    Relevant Orders    DME Misc Order: OSTOMY SUPPLIES (Completed)            IP Start of Service  Start of Care: 03/28/22  Onset Date: 03/25/2022    Preferred Language:English         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      Past Surgical History:   Procedure Laterality Date    BRONCHOSCOPY  12/2020    CESAREAN SECTION, LOW TRANSVERSE      COLOSTOMY      CRANIOTOMY      LUNG LOBECTOMY          OT Acute       Row  Name 04/01/22 1600          Type of Visit    Type of Occupational Therapy note Occupational Therapy Daily Treatment Note       Row Name 04/01/22 1600          Treatment Time    Treatment Start Time 1525     Total TIMED Treatment (min) 30     Total Treatment Time (min) 30       Row Name 04/01/22 1600          Treatment Precautions/Restrictions    Precautions/Restrictions Fall     Fall Socks/charm     Other Precautions/Restrictions Information drain; ostomy       Row Name 04/01/22 1600          Subjective    Subjective information "I needed it" (re: sleep/napping)     Patient status Patient agreeable to treatment;Nursing in agreement for treatment;Patient pain control adequate to participate in therapy       [place] Name 04/01/22 1600          Pain Assessment    Pain Asssessment Tool Numeric Pain Rating Scale     Other Pain Assessment Information  no c/o pain       Row Name 04/01/22 1600          [place] AM-PAC: Daily Activity    Assistance Needed to Put on and Take off Regular Lower Body Clothing 3     Assistance Needed to Bathe, Including Washing, Rinsing, and Drying 3     Assistance Needed to Toilet Environmental manager, Bedpan, or Urinal) 4     Assistance Needed to Put on and Take off Regular Upper Body Clothing 4     Assistance Needed to Take Care of Personal Grooming Such as Brushing Teeth 4     Assistance Needed to Eat Meals 4     AM-PAC Daily Activity Total Score 22     AMP-PAC Daily Activity Impairment rating Score 20-22 - 20-39% impaired       Row Name 04/01/22 1600          Objective    Overall Cognitive Status Intact - no cognitive limitations or impairments noted     Other Objective Findings ADLs/MRADLs    Grooming: IND to wash hands while standing at sink.     Toileting: IND to void bladder, standard toilet.     Toilet Transfer: IND    Lying to Sitting EOB: modI; bed features utilized as needed.     Sitting EOB to Lying: pt left sitting in chair; call light within reach.     Sit-to-stand: IND, no device.     Functional  mobility: SUP for safety, bedside<>unit hallway (community distance; >household distance), no device. No LOB.      Pt education: pt encouraged to complete daily walks with nursing and for daily OOB to chair for meals as part of functional maintenance plan. Pt agreeable.                       OT Acute Tool Box       Row Name 04/01/22 1600          Cognition Assessment    Overall Cognitive Status Intact - no cognitive limitations or impairments noted                        Eval cont.       [place] Name 04/01/22 1600          Patient/Family Education    Learner(s) Patient     Learner response to rehab patient education interventions Verbalizes understanding;Able to return demonstrate teaching;Education materials provided     Patient/family training comments functional maintenance plan (ie functional mobility with nursing; OOB in chair)       [place] Name 04/01/22 1600          Assessment    Assessment Pt is currently performing below functional baseline for completion of self-care tasks and functional mobility. Pt is limited by activity tolerance deficits which is impacting participation, performance, and safety during ADLs. Pt will continue to benefit from skilled inpatient occupational therapy services to further address functional deficits in order to maximize safety and independence.      Plan: address bathing/shower safety considerations in preparation for safe discharge to home; educate on energy conservation strategies in context of home environment.    POST ACUTE REHABILITATION DISCHARGE RECOMMENDATIONS:    Recommended Post-Acute Therapy Follow up:   Home Health    If available, recommend discharge to a setting with the following assistance for the listed deficits:     Deficits :   All mobility  related ADLs assist level: Supervision      Equipment recommendations:   No equipment needed - patient has own equipment          Rehab Potential Excellent       Row Name 04/01/22 1600          Treatment Plan Disussion     Treatment Plan Discussion and Agreement Patient/family/caregiver stated understanding and agreement with the therapy plan;Patient support system determined and all questions were asked and answered       [place] Name 04/01/22 1600          Treatment Plan    Continue therapy to address Activity tolerance limitation;Decline in functional ability/mobility;Decline in performance of activities of daily living (ADL)     Frequency of treatment 3 times per week     Duration of treatment (number of visits) Treatment will continue while in hospital and in need of skilled therapy services     Status of treatment Patient evaluated and will benefit from ongoing skilled therapy       Row Name 04/01/22 1600          Patient Safety Considerations     Patient safety considerations Patient left sitting at end of treatment;Call light left in reach and fall precautions in place     Patient assistive device requirements for safe ambulation No device required       [place] Name 04/01/22 1600          Patient Mobilization Recommendations (as tolerated)     Toileting Patient able to use bathroom     Ambulation Patient to ambulate with nursing two times per day shift, once per night shift     Out of bed Patient out of bed to chair for all meals     Device  None needed     Safe patient handling equipment  None needed       [place] Name 04/01/22 1600          Therapy Plan Communication    Therapy Plan Communication Discussed therapy plan with Nursing and/or Physician       [place] Name 04/01/22 1600          Occupational Therapy Patient Discharge Instructions    Your Occupational Therapist suggests the following Continue to complete your home exercise program daily as instructed;Continue to complete your self care Activities of Daily Living as frequently as possible       Row Name 04/01/22 1600          Therapeutic Procedures    Therapeutic Activities ([id number]) Assistance/facilitation of bed mobility;Progressive mobilization to improve functional  independence;Patient education         Total TIMED Treatment (min) 15     Self-Care/ADL Training [phone]) Personal hygiene;Patient education         Total TIMED Treatment (min) 15                     The occupational therapist of record is endorsed by evaluating occupational therapist.

## 2022-04-01 NOTE — Interdisciplinary (Signed)
Physical Therapy Daily Treatment Note    Admitting Physician:  Diona Browner, *  Admission Date 03/25/2022    Inpatient Diagnosis:   Problem List         Codes    Impaired mobility and ADLs    -  Primary ICD-10-CM: Z74.09, Z78.9  ICD-9-CM: V49.89    Relevant Orders    Home Physical Therapy Evaluation (Completed)    Home Occupational Therapy Evaluation (Completed)    Home Nursing Visit for Medication and/or Safety Check (Completed)    Impaired mobility     ICD-10-CM: Z74.09  ICD-9-CM: 799.89            IP Start of Service   Start of Care: 03/28/22  Reason for referral: Activity tolerance limitation;Decline in functional ability/mobility;Range of motion/strength limitations    Preferred Language:English         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      Past Surgical History:   Procedure Laterality Date    BRONCHOSCOPY  12/2020    CESAREAN SECTION, LOW TRANSVERSE      COLOSTOMY      CRANIOTOMY      LUNG LOBECTOMY          PT Acute       Row Name 04/01/22 1000          Type of  Visit    Type of Physical Therapy note Physical Therapy Daily Treatment Note       Row Name 04/01/22 1000          Treatment Precautions/Restrictions    Precautions/Restrictions Fall;Postsurgical/procedural     Fall Socks/charm       Row Name 04/01/22 1000          Subjective    Patient status Patient agreeable to treatment;Nursing in agreement for treatment;Patient pain control adequate to participate in therapy       [place] Name 04/01/22 1000          Pain Assessment    Pain Asssessment Tool Numeric Pain Rating Scale       Row Name 04/01/22 1000          Numeric Pain Rating Scale    Pain Intensity - rating at present 2     Pain Intensity- rating after treatment 2     Location Abdomen       Row Name 04/01/22 1000          Objective    Overall Cognitive Status Intact - no cognitive limitations or impairments noted     Communication No communication limitations or impairments noted.  Current status of hearing, speech and vision allow functional communication.     Coordination/Motor control No limitations or impairments noted. Movement patterns are fluid and coordinated throughout     Static Sitting Balance Good - able to maintain balance without handhold support, limited postural sway     Dynamic Sitting Balance Good - accepts moderate challenge, able to maintain balance while picking object off floor     Static Standing Balance Good - able to maintain balance without handhold support, limited postural sway     Dynamic Standing Balance Good - accepts moderate challenge, able to maintain balance while picking object off floor     Bed Mobility Supervised     Bed Mobility Comments sit>supine with HOB raised, use of bed rails to complete     Transfers to/from Stand Supervised     Gait Supervised     Device used for ambulation/mobility None     Ambulation Distance > 200'     Other Objective Findings Pt seen for PT treatment session. Up at sink upon entry performing ADL tasks, agreeable to ambulate in hallways. No AD used,  demo's safe recipriocal gait pattern. Eduated on energy conservation throughout, breathing techniques for abdominal discomfort and safety strategies to manage inclined driveway at home. Declines stair practice this date, agreeable to practice at a later date. Upon return to room pt returned to bed, agreeable to sit up OOB to chair around lunch time. Encouraged to walk with nursing 2x more today. All needs met prior to exit                          Eval cont.       [place] Name 04/01/22 1000          [place] AM-PAC: Basic Mobility    Assistance Needed to Turn from Back to Side While in a Flat Bed Without Using Bedrails 4 - None (independent)     Difficulty with Supine to Sit Transfer 3 - A little (supervised/min assist)     How Much Help Needed to Move to/from Bed to Chair 3 - A little (supervised/min assist)     Difficulty with Sit to Stand Transfer from Chair with Arms 3 - A little (supervised/min assist)     How Much Help Needed to Walk in Room 3 - A little (supervised/min assist)     How Much Help Needed to Climb 3-5 Steps with a Rail 3 - A little (supervised/min assist)     AMPAC Total Score 19     Assessment: AM-PAC Basic Mobility Impairment Rating Score 19-22 - 20-39% impaired       Row Name 04/01/22 1000          Patient/Family Education    Learner(s) Patient     Learner response to rehab patient education interventions Verbalizes understanding;Able to return demonstrate teaching       [place] Name 04/01/22 1000          Assessment    Assessment Pt tolerated PT treatment session well. Pt is making good progress with PT in house, as shown by improvements in transfers, gait, endurance, and balance. Pt continues to function below their reported baseline and currently requires supervision assist for safe transfers and gait and will benefit from ongoing skilled PT to maximize functional independence and reach stated functional goals.     Hospital mobility recommendations for nursing/staff: OOB to chair with nursing assist,  Ambulate 3x daily with nursing, and  Complete HEP    POST ACUTE REHABILITATION DISCHARGE RECOMMENDATIONS:    Recommended Post-Acute Therapy Follow up:   Home Health    Patient appropriate for discharge to prior living situation    Deficits :   Gait: assist level: Supervision  Stairs: assist level: Minimum (25% or less assistance)    Equipment recommendations:   None       Rehab Potential Excellent       Row Name 04/01/22 1000          Planned Therapy Interventions and Rationale    Gait Training to normalize gait pattern and improve safety while ambulating;to normalize gait pattern and improve safety while ambulating with assistive device;to improve safety with stair navigation     Therapeutic Activities to improve functional mobility and ability to navigate in the home and/or community;to improve transfers between surfaces     Theraputic Exercise to improve activity tolerance to allow greater independence with functional mobility skills       Row Name 04/01/22 1000          Treatment Plan Disussion    Treatment Plan Discussion and Agreement Patient/family/caregiver stated understanding and agreement with the therapy plan       [place] Name 04/01/22 1000          Treatment Plan    Continue therapy to address Activity tolerance limitation;Decline in functional ability/mobility     Frequency of treatment 4 times per week     Duration of treatment (number of visits) While patient is hospitalized and in need of skilled therapy services     Status of treatment Patient evaluated and will benefit from ongoing skilled therapy       Row Name 04/01/22 1000          Patient Safety Considerations     Patient safety considerations Patient returned to bed at end of treatment;Call light left in reach and fall precautions in place       [place] Name 04/01/22 1000          Therapeutic Procedures    Gait Training [phone]) Gait pattern analysis and treatment of deviations;Gait training with varying resistance or speed;Muscle facilitation to  address gait deviation(s);Patient education;Postural alighnment/biomechanic training during gait;Weight shift and postural control activities during gait        Total TIMED Treatment (min)  15     Therapeutic Activities ([id number])  Assistance/facilitation of bed mobility;Functional activities;Patient education;Progressive mobilization to improve functional independence;Transfer training with weight shift and direction change;Weight shift activities to improve safety in unsupported sitting or standing        Total TIMED Treatment (min)  15       Row Name 04/01/22 1000          Treatment Time     Total TIMED Treatment  (min) 30     Total Treatment Time (min) 30     Treatment start time 1000                        The physical therapist of record is endorsed by evaluating physical therapist.

## 2022-04-02 LAB — GLUCOSE (POCT)
Glucose (POCT): 124 mg/dL — ABNORMAL HIGH (ref 70–99)
Glucose (POCT): 125 mg/dL — ABNORMAL HIGH (ref 70–99)
Glucose (POCT): 138 mg/dL — ABNORMAL HIGH (ref 70–99)
Glucose (POCT): 179 mg/dL — ABNORMAL HIGH (ref 70–99)

## 2022-04-02 LAB — BASIC METABOLIC PANEL, BLOOD
Anion Gap: 9 mmol/L (ref 7–15)
BUN: 8 mg/dL (ref 8–23)
Bicarbonate: 25 mmol/L (ref 22–29)
Calcium: 8.9 mg/dL (ref 8.5–10.6)
Chloride: 106 mmol/L (ref 98–107)
Creatinine: 0.68 mg/dL (ref 0.51–0.95)
Glucose: 124 mg/dL — ABNORMAL HIGH (ref 70–99)
Potassium: 4.2 mmol/L (ref 3.5–5.1)
Sodium: 140 mmol/L (ref 136–145)
eGFR Based on CKD-EPI 2021 Equation: >60 mL/min/{1.73_m2}

## 2022-04-02 LAB — PHOSPHORUS, BLOOD: Phosphorous: 3.5 mg/dL (ref 2.7–4.5)

## 2022-04-02 LAB — MAGNESIUM, BLOOD: Magnesium: 2 mg/dL (ref 1.6–2.4)

## 2022-04-02 NOTE — Plan of Care (Signed)
Problem: Promotion of Health and Safety  Goal: Promotion of Health and Safety  Description: The patient remains safe, receives appropriate treatment and achieves optimal outcomes (physically, psychosocially, and spiritually) within the limitations of the disease process by discharge.    Information below is the current care plan.  Outcome: Progressing  Flowsheets  Taken 04/02/2022 1251  Guidelines: Inpatient Nursing Guidelines  Individualized Interventions/Recommendations #1:   Assess and treat pain   Administer PRN pain meds   Apply abd binder  Individualized Interventions/Recommendations #2 (if applicable):   Assess and monitor for s/s of infection at surgical site   Monitor vitals  Individualized Interventions/Recommendations #3 (if applicable):   Fall precautions enforced   pt independent and ambulatory, steady on feet   non-skid socks on   bed low and locked   bed alarm not on per pt's request  Outcome Evaluation (rationale for progressing/not progressing) every shift: AOx4, VSS, on RA, afebrile. C/o moderate pain and managed w/ PRN pain meds and pain improved after applying abd binder. Monitoring output of colostomy and JP drain in abd x1. JP drain removed by MD in afternoon. Continent of bladder, using bathroom. Tolerating diet. Receiving RT txs and multiple respiratory meds. Call light within reach. All needs met at this time. Will continue plan of care.  Taken 04/02/2022 0757  Patient /Family stated Goal: To go home  Note:

## 2022-04-02 NOTE — Plan of Care (Signed)
Problem: Promotion of Health and Safety  Goal: Promotion of Health and Safety  Description: The patient remains safe, receives appropriate treatment and achieves optimal outcomes (physically, psychosocially, and spiritually) within the limitations of the disease process by discharge.    Information below is the current care plan.  04/02/2022 0111 by Lesleigh Noe, RN  Outcome: Progressing  Flowsheets  Taken 04/01/2022 2200 by Lesleigh Noe, RN  Patient /Family stated Goal: go home  Taken 04/01/2022 0358 by Barnabas Lister, RN  Individualized Interventions/Recommendations #1: Educate patient on s/s of infection  Taken 03/31/2022 0402 by Shea Stakes, RN  Individualized Interventions/Recommendations #4 (if applicable): Educate pt. on limiting pain medication as it can lead to reduced bowel motility.  Note:  Pt. AAOx4, VSS on room air, pt. Reports mild to moderate abdominal pain that is controlled with scheduled and prn pain medications. Pt. Tolerating diet with no complaints of nausea or vomiting. Ileostomy putting out moderate amounts of brown liquid. Midline discontinued 3/25, jp drain intact with small amounts of output. Q6 accuchecks complete, no coverage needed. Bed in low position, bed rails x2, call light within reach, poc continues.          04/02/2022 0110 by Lesleigh Noe, RN  Outcome: Progressing  Note:

## 2022-04-02 NOTE — Interdisciplinary (Signed)
Nutrition note:     Provided pt w/ ileostomy diet education as per pt request. Reviewed handout from [facility] with pt. PT is somewhat familiar given medical hx, but the review was appreciated. Will cont low fiber diet for 4-6 weeks. Pt tolerating diet now eating ~50% of meals. Noted having some cramping after meals. MD also adding in Metamucil wafers today per pt. Also placed order for outpatient nutrition consult to help guide pt once discharged. All questions were answered. Pt to re-read handout and RD able to f/up with any questions as needed. Per pt, possible d/c tomorrow.       York Cerise, RD, CNSC

## 2022-04-02 NOTE — Progress Notes (Signed)
MIS Surgery Progress Note    Patient Name: Tiffany Chen MRN: [id number]    Hospital Day:   8 days - Admitted on: 03/25/2022  Post Operative Day(s): Day of surgery 3/18    HPI: 20F with asymptomatic GERD and history of RUL MAC requiring lobectomy as well as symtpomatic parastomal hernia. S/p repair of recurrent incisional/ventral hernia, repair of parastomal hernia with mobilization of the left rectus muscle w/ myofascial flap component separation w/ mesh, as well as ostomy takedown w/ creation of diverting loop ostomy w/ Dr. Hillery Hunter and Dr. Astrid Divine 3/18    Events/Subjective:  - NAEO, AFVSS  - OOB in chair this morning  - Would like an abdominal binder for extra comfort  - Interested in taking a shower today  - DLI with thick applesauce texture output  - 425 mL PO intake recorded, however patient reports no issue with tolerating diet    Vitals signs:  Latest entry:   Temperature: 97.9 F (36.6 C)  Heart Rate: 76  Respirations: 17  Blood pressure (BP): 143/64  SpO2: 96 %  Weight: 67.9 kg (149 lb 12.8 oz)  Percentage Weight Change (%): 7.86 %    Temperature:  [97.4 F (36.3 C)-97.9 F (36.6 C)] 97.9 F (36.6 C) (03/26 0700)  Blood pressure (BP): (129-143)/(63-74) 143/64 (03/26 0700)  Heart Rate:  [69-82] 76 (03/26 0700)  Respirations:  [16-18] 17 (03/26 0700)  Pain Score: 3 (03/26 0553)  O2 Device: None (Room air) (03/25 1622)  SpO2:  [96 %-98 %] 96 % (03/26 0700)    Intake/Output                         04/01/22 0600 - 04/02/22 0559 04/02/22 0600 - 04/03/22 0559     [id number] 1800-0559 Total 0600-1759 [id number] Total                 Intake    P.O.  480  -- 480  --  -- --    Total Intake 480 -- 480 -- -- --       Output    Drains  20  10 30   --  -- --    Stool  225  200 425  --  -- --    Total Output 245 210 455 -- -- --       Net I/O     235 -210 25 -- -- --            Patient Lines/Drains/Airways Status       Active PICC Line / CVC Line / PIV Line / Drain / Airway / Intraosseous Line / Epidural Line / ART  Line / Line Type / Wound / Pressure Ulcer Injury       Name Placement date Placement time Site Days    Peripheral IV - 20 G Left Wrist 03/25/22  0741  Wrist  8    Peripheral IV - 20 G Right Hand 03/29/22  1530  Hand  3    Closed/Suction Drain -  Lateral RLQ 03/25/22  1327  RLQ  7    Colostomy -  LLQ 03/20/21  --  LLQ  378    Ileostomy -  Loop ileostomy RLQ 03/25/22  1400  RLQ  7    Incision -  03/25/22 0942 Abdomen Upper 03/25/22  0942  -- 7                    Diet  Diet Therapeutic; Low Fiber  Nutritional Supplement - Oral Boost High Protein; Boost High Protein Vanilla; Deliver Supplements: BID    No data recorded     Labs:  CBC  Recent Labs     03/31/22  0525 04/01/22  0647   WBC 4.4 4.8   HGB 9.5* 9.5*   HCT 27.5* 28.0*   PLT 258 263   SEG 64 64   LYMPHS 18 19   MONOS 13 11       Chemistry  Recent Labs     04/01/22  0647 04/02/22  0603   NA 140 140   K 4.0 4.2   CL 108* 106   BICARB 22 25   BUN 7* 8   CREAT 0.64 0.68   GLU 125* 124*   [place] 8.3* 8.9   MG 2.1 2.0   PHOS 3.2 3.5       No results for input(s): "ALK", "AST", "ALT", "TBILI", "DBILI", "ALB" in the last 72 hours.    Coags  No results for input(s): "PT", "PTT", "INR" in the last 72 hours.    Radiology:   X-Ray Abdomen Post Tube Placement    Result Date: 03/26/2022  IMPRESSION: 1. Suction-type enteric tube, coursing below the diaphragm, with side-port projecting over the at or just beyond the GE junction , with tip projecting over the gastric fundus. 2. Partially imaged surgical drain projects over the left lower quadrant of the abdomen. 3. Nonobstructive bowel gas pattern.      Microbiology:  Microbiology Results (last 30 days)       Procedure Component Value - Date/Time    Methicillin Resistant Staphylococcus aureus (MRSA), Surveillance, Nasal Angela Burke Nares [id number] Collected: 03/25/22 2103    Lab Status: Final result Specimen: Nares Updated: 03/27/22 1259     MRSA Surveillance Result No Methicillin Resistant Staphylococcus aureus isolated.               Physical Exam:  General Appearance: healthy, alert, no distress, pleasant affect, cooperative, cooperative.  Heart:  normal rate and regular rhythm, no murmurs, clicks, or gallops.  Lungs: clear to auscultation and percussion, no chest deformities noted and Equal chest rise bilaterally.  L abdominal drain (removed 3/24).  R abdominal drain w/ serosanguinous fluid. Midline incision healing well. Abdomen appropriately tender . Old LUQ ostomy site with gauze packing. R ileostomy pouched & pink, productive with applesauce texture stool output  Extremities:  no cyanosis, clubbing, or edema.    Medications:  IV Meds   sodium chloride       Scheduled Meds   acetaminophen  650 mg Q8H    Amikacin Sulfate Liposome  590 mg Q24H    atorvastatin  10 mg Daily    azithromycin  250 mg Daily    cholecalciferol  5,000 Units Daily    CLOFAZIMINE IRB 21-0063 50 MG CAPSULE 100 mg  100 mg Daily    enoxaparin  40 mg Daily    ethambutol  800 mg Daily    ferrous sulfate  324 mg Daily    fluoxetine  40 mg Daily    insulin glargine  0.1 Units/kg Daily at Noon    insulin regular  0-10 Units Q6H    lansoprazole  30 mg QAM AC    lidocaine  2 patch Q24H    loratadine  10 mg Daily    melatonin  5 mg HS    rifAMPin  600 mg Daily    salmeterol  1 puff Q12H  tiotropium  1 capsule Daily     PRN Meds   albuterol  2 puff Q6H PRN    dextrose  12.5 g PRN    glucagon  1 mg Once PRN    glucose  4 tablet PRN    glucose  1 Tube PRN    menthol  1 lozenge Q2H PRN    nalOXone  0.1 mg Q2 Min PRN    oxyCODONE  5 mg Q4H PRN    Or    oxyCODONE  10 mg Q4H PRN    oxyCODONE  5 mg Q4H PRN    phenol  1 spray Q2H PRN    sodium chloride   Continuous PRN    sodium chloride  4 mL Q12H PRN         ASSESSMENT / PLAN:    [name] with history of GERD, parastomal hernia, 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 Hillery Hunter and Dr. Astrid Divine 3/18.     Recovering appropriately.     Neuro: Multi-modal pain control   Scheduled tylenol  Lidocaine patches q12 prn  Continue PO pain meds  Oxycodone as needed for pain    CV: Atorvastatin    Pulmonary: out of bed, incentive spirometry, maintaining saturations on room air  Encourage IS every hour while awake      #MAC lung disease  Continue home Arikayce nebulizer   Azithromycin 250mg  PO daily  Clofazimine 100mg  BID  Ethambutol 800mg  daily    Rifampin 600mg  daily  Salmeterol 1 puff q12  Albuterol 2 puff q6 prn    GI:   S/p ostomy takedown w/ creation of diverting loop ostomy and repair of recurrent incisional/ventral hernia, repair of parastomal hernia   Continue low res diet as tolerated  Continue PPI  Bowel regimen  Ostomy education,   Pack previous ostomy site with dry gauze, change daily and PRN  Abdominal binder for comfort    FEN:    Voiding spontaneously since foley removal.  Monitor and Replete electrolytes as needed.    GU/Renal: Strict In's and Out's.  #Hyperkalemia secondary to low volume depletion- now resolved  K 4.2 this AM, CTM    ID: Afebrile. WBC 4.8    Hematology: Hgb 9.5  Continue Lovenox  Drain to bulb suction, monitor output, RLQ drain in pelvis to bulb suction. Will plan to remove prior to discharge    Prophylaxis: Continue PO lansoprazole. Prophylactic Lovenox sub-cutaneous, SCDs while in bed.     PT/OT: Ambulate with assist QID; OOB    The patient was examined and discussed with chief resident and team and seen with  Dr. Astrid Divine , who agrees with the above assessment and plan.    Sindy Guadeloupe, NP  Surgery [phone]

## 2022-04-02 NOTE — Discharge Summary (Signed)
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     Past Surgical History:   Procedure Laterality Date    BRONCHOSCOPY  12/2020    CESAREAN SECTION, LOW TRANSVERSE      COLOSTOMY      CRANIOTOMY      LUNG LOBECTOMY         Additional Hospital Diagnoses ("rule out" or  "suspected" diagnoses, etc.):  Hyperkalemia 2/2 volume depletion    Principal Procedure During This Hospitalization (required):  Procedure(s):  Open ventral hernia repair with component separation, insertion of mesh - Wound Class: Class III (Contaminated) - Incision Closure: Deep and Superficial Layers  Open colostomy takedown with low anterior resection and colorectal anastomosis, open splenic flexure mobilization, creation of diverting loop ileostomy, flexible sigmoidoscopy, intraoperative bowel perfusion assessment - Wound Class: Class III (Contaminated) - Incision Closure: Deep and Superficial Layers on 03/25/2022 (see brief op note below)       BRIEF OPERATIVE NOTE     DATE: 03/25/2022  TIME: 2:16 PM     PREOPERATIVE DIAGNOSIS: Presence of colostomy since 2009      POSTOPERATIVE DIAGNOSIS: Same         ATTENDING SURGEON:   Surgeon(s) and Role:  Panel 1:     * Broderick, Ethelle Lyon, MD - Primary     * Charna Busman, MD - Fellow  Panel 2:     Astrid Divine, MD - Primary     * Heloise Beecham, MD     ANESTHESIA: GETA     FINDINGS:   Colostomy takedown   Extensive LOA  Difficult pelvic dissection to release rectum  Stapled colorectal anastomosis   Ileostomy creation   Ventral hernia repair  with mesh  Component separation  JP drain placement x2     SPECIMENS:   ID Type Source Tests Collected by Time Destination   A : RECTUM Tissue Rectum PATHOLOGY TISSUE Bradd Burner, MD 03/25/2022 1044     B : Colon resection with colostomy Tissue Colon PATHOLOGY TISSUE Jasmine December Astrid Divine, MD 03/25/2022 1115     C : OMENTUM Tissue Omentum PATHOLOGY TISSUE EXAM Diona Browner, MD 03/25/2022 1237        Fluids/Blood Products:       IV Fluids: 2.7L LR     Blood Products: None    EBL: 200 mL     Urine Output: 2L     COMPLICATIONS: None     DISPOSITION: IMU    Other Procedures Performed During This Hospitalization (required):  X-Ray Chest Single View    Result Date: 03/31/2022  IMPRESSION: No  definite acute findings. Bronchial wall thickening which can be seen with large airways disease.    X-Ray Abdomen Single View    Result Date: 03/29/2022  IMPRESSION: Nonobstructive bowel gas pattern.     X-Ray Abdomen Post Tube Placement    Result Date: 03/28/2022  IMPRESSION: Suction-type enteric tube, coursing below the diaphragm, with side-port projecting over the gastric body , with tip projecting over the gastric body. Nonspecific paucity of bowel gas.     Consultations Obtained During This Hospitalization:  Pain Service  Wound Care Service    Key consultant recommendations:  Pain:   Evaluation of neurologic exam showed no evidence of epidural hematoma.  - Neuraxial morphine resolved.  - Continue IV dilaudid PCA PRN breakthrough pain until diet advanced.  - Would benefit from Toradol 15mg  Q6H if able.  - Continue IV Tylenol, lidoderm patches.  - Encourage OOB to chair QID, ambulation.    Wound Care-  All aspects of ostomy management and care discussed primarily focusing on differences with ileostomy vs colostomy. We discussed risks of dehydration, food blockages and hygiene, pouch changes and emptying. .Bedside RN to continue teaching and Please have patient practice emptying pouch   Appliances: [employer] [phone]   20 per month x 11 months refill (or HCPCS [phone])     Pouches:  [employer]  #[phone]        20 per month x 11 months refill (or [facility]PCS [phone])     Barrier rings: Adapt barrier rings [phone] -  20 per month  x 11 months refill (or HCPCS A4385)     No sting skin prep: any brand -   one box per month x 11 months refill (or HCPCS A5120)     Adhesive remover: any brand -   one box per month x 11 months refill (or HCPCS A4456)     Ostomy belt:      [employer] medium -  #[id number]    one per month  x 11 months refill (or [facility]S [phone])     Ostomy powder: Adapt 7906-  one per month x 11 months refill (or HCPCS A4371)     Lubricating deodorant:  Adapt    one bottle per month  x 11 months refill (or [facility]PCS [phone])  Reason for  Admission to the Hospital / History of Present Illness:  Tiffany Chen is a 77 year old female with a past medical history of asymptomatic GERD and history of RUL MAC requiring lobectomy as well as symtpomatic parastomal hernia. She was admitted for repair of recurrent incisional/ventral hernia, repair of parastomal hernia with mobilization of  the left rectus muscle w/ myofascial flap component separation w/ mesh, as well as ostomy takedown w/ creation of diverting loop ostomy w/ Dr. Hillery Hunter and Dr. Astrid Divine, which was done on 3/18.    Hospital Course by Problem (required):  #Ventral Hernia Repair  #Colostomy Takedown  [name] underwent Open ventral hernia repair with component separation, insertion of mesh for Ventral hernia without obstruction or gangrene  Colostomy status by Dr. Hillery Hunter, Ethelle Lyon, MD. She also underwent open colostomy takedown with low anterior resection and colorectal anastomosis, open splenic flexure mobilization, creation of diverting loop ileostomy, lysis of adhesions, flexible sigmoidoscopy, and intraoperative bowel perfusion assessment by Dr. Astrid Divine, MD. The patient tolerated the procedure well and was admitted to the hospital for post-operative care and recovery. The patient had an uneventful post-operative course.    Post-operative labs notable for hyperkalemia (5.7) without EKG abnormalities and slightly increased cr likely secondary to low volume depletion. Potassium and creatine normalized with fluid rehydration. Patient had a NGT in place that removed POD3 once she had return of bowel function. She was given a clear liquid diet with gradual advancement to low fiber on POD6. She tolerated the diet well without nausea or vomiting. Before discharge, she was provided ileostomy education and risks of dehydration and food limitations. Throughout her admission, her ostomy output was well controlled without need for anti-motility medications (<1L daily).  Her OR drains remained low in output and clear SS in color- subcutaneous drain was removed POD 6 and pelvic drain removed POD 7. Prevena removed POD6 and incision c/d/I without concern. On day of discharge, the patient's pain was well controlled on oral pain medications, voided without assistance, ambulated without difficulty, and tolerated a Diet Therapeutic; Low Fiber  Nutritional Supplement - Oral Boost High Protein; Boost High Protein Vanilla; Deliver Supplements: BID diet. The patient was discharged with an abdominal binder and without any drains. She was discharged on 8 Days Post-Op to home. Post discharge plan includes follow up with Dr. Hillery Hunter, Ethelle Lyon, * and/or Dr. Astrid Divine in 1-2 weeks. Wound care and ER precautions were discussed with the patient. No heavy lifting (nothing>10lbs) x 4-6 weeks after surgery. She is to remain on a low fiber diet. Discussed r/o dehydration and prescribed anti-motilty to have in the event that her ileostomy output <1.2L. Pt expressed understanding of this and their follow up plans.    Tests Outstanding at Discharge Requiring Follow Up:  ID Type Source Tests Collected by Time Destination   A : RECTUM Tissue Rectum PATHOLOGY TISSUE Bradd Burner, MD 03/25/2022 1044    B : Colon resection with colostomy Tissue Colon PATHOLOGY TISSUE Jasmine December Astrid Divine, MD 03/25/2022 1115    C : OMENTUM Tissue Omentum PATHOLOGY TISSUE EXAM Diona Browner, MD 03/25/2022 1237        Discharge Condition (required):  Stable.    Key Physical Exam Findings at Discharge:  Exam:  General Appearance: healthy, alert, no distress, pleasant affect, cooperative, cooperative.  Heart: normal rate and regular rhythm, no murmurs, clicks, or gallops.  Lungs: clear to auscultation and percussion, no chest deformities noted and equal chest rise bilaterally.  Abdomen: Midline incision healing well. Abdomen appropriately tender. Old LUQ ostomy site with gauze packing. R ileostomy  pouched & pink, productive with applesauce texture stool output  Extremities:  no cyanosis, clubbing, or edema.    Discharge Diet:    Diet Therapeutic; Low Fiber  Nutritional Supplement - Oral Boost High Protein; Boost High Protein  Vanilla; Deliver Supplements: BID    Discharge Medications:     What To Do With Your Medications        START taking these medications        Add'l Info   acetaminophen 325 MG tablet  Commonly known as: TYLENOL  Take 2 tablets (650 mg) by mouth every 8 hours.   Quantity: 100 tablet  Refills: 0     oxyCODONE 5 MG immediate release tablet  Commonly known as: ROXICODONE  Take 1 tablet (5 mg) by mouth every 4 hours as needed for Severe Pain (Pain Score 7-10).   Quantity: 5 tablet  Refills: 0     psyllium Wafr  Take 2 Wafers by mouth every 12 hours as needed (As needed to maintain ileostomy output <1.2L).   Quantity: 24 packet  Refills: 0            CONTINUE taking these medications        Add'l Info   Arikayce 590 MG/8.4ML Susp  Inhale the contents of one vial (590mg ) via Lamira device 1 time daily as directed.  Generic drug: Amikacin Sulfate Liposome   Quantity: 28 each  Refills: 5     atorvastatin 10 MG tablet  Commonly known as: LIPITOR  Take 1 tablet (10 mg) by mouth daily.   Quantity: 90 tablet  Refills: 2     azithromycin 250 MG tablet  Commonly known as: ZITHROMAX  TAKE 1 TABLET BY MOUTH  DAILY   Quantity: 60 tablet  Refills: 11     CLOFAZIMINE IRB 21-0063 50 MG CAPSULE  Take 2 capsules (100mg ) by mouth once daily with food as directed.   Quantity: 200 capsule  Refills: 0     ethambutol 400 MG tablet  Commonly known as: MYAMBUTOL  TAKE 2 TABLETS BY MOUTH  DAILY   Quantity: 120 tablet  Refills: 11     ferrous sulfate 325 (65 Fe) MG tablet  Take 1 tablet (325 mg) by mouth daily.   Refills: 0     fluoxetine 40 MG capsule  Commonly known as: PROZAC  Take 1 capsule (40 mg) by mouth daily.   Quantity: 90 capsule  Refills: 3     metFORMIN 500 mg tablet  Commonly known as: GLUCOPHAGE  Take 1  tablet (500 mg) by mouth 2 times daily (with meals).   Quantity: 180 tablet  Refills: 1     omeprazole 20 MG capsule  Commonly known as: PRILOSEC  TAKE 1 CAPSULE BY MOUTH IN  THE MORNING BEFORE  BREAKFAST   Quantity: 90 capsule  Refills: 3     ramipril 2.5 MG capsule  Commonly known as: ALTACE  Take 1 capsule (2.5 mg) by mouth daily.   Quantity: 90 capsule  Refills: 1     rifAMPin 300 MG capsule  Commonly known as: RIFADIN  TAKE 2 CAPSULES BY MOUTH  DAILY   Quantity: 120 capsule  Refills: 11     sodium chloride 7 % Nebu  USE 1 VIAL VIA NEBULIZER  EVERY 12 HOURS   Quantity: 720 mL  Refills: 3     umeclidinium-vilanterol 62.5-25 MCG/ACT inhaler  Commonly known as: ANORO ELLIPTA  Inhale 1 puff by mouth daily.   Quantity: 60 each  Refills: 11     Ventolin HFA 108 (90 Base) MCG/ACT inhaler  USE 2 INHALATIONS BY MOUTH  EVERY 6 HOURS AS NEEDED FOR WHEEZING  Generic drug: albuterol   Quantity: 72 g  Refills: 3  vitamin D3 125 MCG (5000 UT) capsule  Take 1 capsule (5,000 Units) by mouth every 24 hours.   Refills: 0               Where to Get Your Medications        These medications were sent to [facility]  [place], [place] [address]      Hours: Mon-Fri: 8:30am-7:00pm; Sat-Sun & Holidays: 9:00am-5:00pm Phone: [phone]   acetaminophen 325 MG tablet  oxyCODONE 5 MG immediate release tablet  psyllium Wafr         Allergies:  Allergies   Allergen Reactions    Walnuts Swelling     Tongue swollen and burn       Discharge Disposition:  Home.    Discharge Code Status: Orders Placed This Encounter      Full Code - Call Code    This code status is not changed from the time of admission.    Follow Up Appointments:  Scheduled appointments:  Future Appointments   Date Time Provider [place]   04/15/2022  1:00 PM Damaris Schooner, MD EXE Neuro EXE   04/17/2022 10:00 AM Katheren Shams, MD MOS Pulm Gen MOS   04/29/2022  9:00 AM [facility] INFUSION, 4TH FLOOR [facility] Onc Chem  [place]   05/20/2022 12:30 PM Gilda Crease, MD [facility] Ophth [facility]   05/30/2022  1:00 PM Barbaraann Share [facility] Hns Aud [facility]   07/31/2022 10:15 AM Maudie Mercury Bedelia Person, MD UPC Allergy UPC   08/30/2022  2:00 PM Otila Kluver [facility]   11/15/2022  1:00 PM Otila Kluver [facility]       For appointments requested for after discharge that have not yet been scheduled, refer to the Post Discharge Referrals section of the After Visit Summary.    Colorectal Surgery   [facility]

## 2022-04-02 NOTE — Interdisciplinary (Signed)
04/02/22 1144   Readmission Risk Assessment   Readmission Within 30 Days of Discharge * No   Do You Have Transportation Issues/Concerns That Make It Difficult To Get To Your Appointments?  No   Recent Hospitalizations (Within Last 6 Months) * No   High Risk For Readmission * Yes   High Risk Indicators Over age 78;Impaired mobility   Action Taken To Prevent Readmission After Discharge * Home health referral;Durable medical equipment   Recommendations to the Physician* Home health orders;Durable medical equipment orders   Follow Up/Progress   Is the Patient Medically Stable for Discharge * No   CM Discharge Arrangements Complete No   Where was the patient admitted from? * Home   Barriers to Discharge * Clinical reason;Pending PT/OT recommendations/clearance;Consult Service   Anticipated Discharge Disposition/Needs Home with Family;HH RN;Ostomy supplies;Home PT/OT   Post Acute Services Referred To [place];Ostomy   Home Health agencies status  Accepted   Ostomy status Pending   PATIENT CHOICE: Patient/Family/Legal/Surrogate Decision Maker Has Been Given a List Options And Choice In The Selection of Post-Acute Care Providers Yes   Family/Caregiver's Assessed for * Readiness, willingness, and ability to provide or support self-management activities   Respite Care * Not Applicable   Patient/Family/Other Are In Agreement With Discharge Plan * To be determined   Public Health Clearance Needed * Not Applicable   Anticipated DischargeTransportation *  Car   Anticipated Discharge Transportation Details family     04/02/22  11:46 AM    Medical Intervention(s) requiring continued Hospital Stay: 77 y/o F with history of GERD, parastomal hernia, 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 S/p ostomy takedown w/ creation of diverting loop ostomy and repair of recurrent incisional/ventral hernia, repair of parastomal hernia. Pack previous ostomy site with dry gauze, change daily and PRN, abdominal binder for comfort, continue low res diet as tolerated, continue PPI, bowel regimen, ostomy education     Anticipated dispo plan & anticipated DC needs: HOME with [place] RN/PT/OT (have accepting agencies, pending choice) and ostomy supplies (referral updated/resent)     Anticipated Transportation @ discharge: friend/family     Barriers to Discharge: NONE

## 2022-04-03 ENCOUNTER — Other Ambulatory Visit: Payer: Self-pay

## 2022-04-03 LAB — BASIC METABOLIC PANEL, BLOOD
Anion Gap: 10 mmol/L (ref 7–15)
BUN: 7 mg/dL — ABNORMAL LOW (ref 8–23)
Bicarbonate: 24 mmol/L (ref 22–29)
Calcium: 9.1 mg/dL (ref 8.5–10.6)
Chloride: 104 mmol/L (ref 98–107)
Creatinine: 0.77 mg/dL (ref 0.51–0.95)
Glucose: 140 mg/dL — ABNORMAL HIGH (ref 70–99)
Potassium: 4.3 mmol/L (ref 3.5–5.1)
Sodium: 138 mmol/L (ref 136–145)
eGFR Based on CKD-EPI 2021 Equation: >60 mL/min/{1.73_m2}

## 2022-04-03 LAB — PHOSPHORUS, BLOOD: Phosphorous: 3.7 mg/dL (ref 2.7–4.5)

## 2022-04-03 LAB — GLUCOSE (POCT)
Glucose (POCT): 124 mg/dL — ABNORMAL HIGH (ref 70–99)
Glucose (POCT): 183 mg/dL — ABNORMAL HIGH (ref 70–99)

## 2022-04-03 LAB — MAGNESIUM, BLOOD: Magnesium: 1.9 mg/dL (ref 1.6–2.4)

## 2022-04-03 MED ORDER — OXYCODONE HCL 5 MG OR TABS
5.0000 mg | ORAL_TABLET | ORAL | 0 refills | Status: DC | PRN
Start: 2022-04-03 — End: 2022-04-16
  Filled 2022-04-03: qty 5, 1d supply, fill #0

## 2022-04-03 MED ORDER — ACETAMINOPHEN 325 MG PO TABS
650.0000 mg | ORAL_TABLET | Freq: Three times a day (TID) | ORAL | 0 refills | Status: DC
Start: 2022-04-03 — End: 2022-05-03
  Filled 2022-04-03: qty 100, 17d supply, fill #0

## 2022-04-03 MED ORDER — METAMUCIL PO WAFR
2.0000 | WAFER | Freq: Two times a day (BID) | ORAL | 0 refills | Status: DC | PRN
Start: 2022-04-03 — End: 2022-10-16
  Filled 2022-04-03: qty 24, 12d supply, fill #0

## 2022-04-03 NOTE — Plan of Care (Signed)
Problem: Promotion of Health and Safety  Goal: Promotion of Health and Safety  Description: The patient remains safe, receives appropriate treatment and achieves optimal outcomes (physically, psychosocially, and spiritually) within the limitations of the disease process by discharge.    Information below is the current care plan.  Outcome: Discharged  Flowsheets  Taken 04/03/2022 1120  Guidelines: Inpatient Nursing Guidelines  Individualized Interventions/Recommendations #1:   Assess and treat pain   Administer PRN pain meds   Apply abd binder  Individualized Interventions/Recommendations #2 (if applicable):   Assess and monitor for s/s of infection at surgical site   Monitor vitals   Educated on how to identify s/s of infection  Individualized Interventions/Recommendations #3 (if applicable):   Fall precautions enforced   pt independent and ambulatory, steady on feet   non-skid socks on   bed low and locked   bed alarm not on per pt's request  Outcome Evaluation (rationale for progressing/not progressing) every shift: AOx4, VSS, on RA, afebrile. C/o minimal pain and managed w/ PRN pain meds and pain improved after applying abd binder. Monitoring output of colostomy. Continent of bladder, using bathroom. Tolerating diet. Receiving RT txs and multiple respiratory meds. Pt cleared to go home. Education given. Call light within reach. All needs met at this time. Will continue plan of care.  Taken 04/03/2022 0800  Patient /Family stated Goal: To go home  Note:

## 2022-04-03 NOTE — Interdisciplinary (Signed)
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)     ICD-10-CM: Z93.2  ICD-9-CM: V44.2    Relevant Medications    psyllium (METAMUCIL) WAFR    Other Relevant Orders    DME Misc Order: OSTOMY SUPPLIES (Completed)            IP Start of Service   Start of Care: 03/28/22  Reason for referral: Activity tolerance limitation;Decline in functional ability/mobility;Range of motion/strength limitations    Preferred Language:English         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      Past Surgical History:   Procedure  Laterality Date    BRONCHOSCOPY  12/2020    CESAREAN SECTION, LOW TRANSVERSE      COLOSTOMY      CRANIOTOMY      LUNG LOBECTOMY          PT Acute       Row Name 04/03/22 0900          Type of Visit    Type of Physical Therapy note Physical Therapy Discharge Summary       Row Name 04/03/22 0900          Treatment Precautions/Restrictions    Precautions/Restrictions Fall;Postsurgical/procedural     Fall Socks/charm       Row Name 04/03/22 0900          Subjective    Patient status Patient agreeable to treatment;Nursing in agreement for treatment;Patient pain control adequate to participate in therapy       Row Name 04/03/22 0900          Pain Assessment    Pain Asssessment Tool Numeric Pain Rating Scale       Row Name 04/03/22 0900          Numeric Pain Rating Scale    Pain Intensity - rating at present 0     Pain Intensity- rating after treatment 0     Location Denies pain  [place] Name 04/03/22 0900          Objective    Overall Cognitive Status Intact - no cognitive limitations or impairments noted     Communication No communication limitations or impairments noted. Current status of hearing, speech and vision allow functional communication.     Coordination/Motor control No limitations or impairments noted. Movement patterns are fluid and coordinated throughout     Static Sitting Balance Normal - able to maintain steady balance without handhold support     Dynamic Sitting Balance Normal - accepts maximal challenge and can shift weight easily within full range in all directions     Static Standing Balance Good - able to maintain balance without handhold support, limited postural sway     Dynamic Standing Balance Good - accepts moderate challenge, able to maintain balance while picking object off floor     Bed Mobility Modified independent     Bed Mobility Comments via log roll, HOB raised     Transfers to/from Stand Modified independent     Transfer Comments no AD used     Gait Modified independent     Device used  for ambulation/mobility None     Ambulation Distance 300'     Step Navigation Negotiated 12 stairs with use of single railing. VCs for energy conservation, pacing and breathing techniques     Other Objective Findings Pt seen for PT treatment session. Feeling ready for d/c home today, agreeable to practice stairs in anticipation of home entry. Ambulated in halls, no AD used, demo's safe recipriocal gait pattern without LOB. Eduated on energy conservation throughout, breathing techniques for abdominal discomfort and safety strategies to manage inclined driveway at home. Practiced stairs, negotiated full flight of stairs with single hand rail, encouraged to have SPV at home for first few times for safety. Post treatment pt returned to bed to rest. All needs met prior to exit                          Eval cont.       [place] Name 04/03/22 0900          [place] AM-PAC: Basic Mobility    Assistance Needed to Turn from Back to Side While in a Flat Bed Without Using Bedrails 4 - None (independent)     Difficulty with Supine to Sit Transfer 4 - None (independent)     How Much Help Needed to Move to/from Bed to Chair 4 - None (independent)     Difficulty with Sit to Stand Transfer from Chair with Arms 4 - None (independent)     How Much Help Needed to Walk in Room 4 - None (independent)     How Much Help Needed to Climb 3-5 Steps with a Rail 3 - A little (supervised/min assist)     AMPAC Total Score 23     Assessment: AM-PAC Basic Mobility Impairment Rating Score 23 - 1-19% impaired       Row Name 04/03/22 0900          Patient/Family Education    Learner(s) Patient     Learner response to rehab patient education interventions Verbalizes understanding;Able to return demonstrate teaching       [place] Name 04/03/22 0900          Assessment    Assessment Pt tolerated PT treatment well. Pt presents at mod I levels of mobility and ambulated 300' and negotiated 12 stairs with single  railing. Recommend d/c home when medically appropriate  with HH PT and family support. No further acute PT needs, PT signing off.     POST ACUTE REHABILITATION DISCHARGE RECOMMENDATIONS:    Recommended Post-Acute Therapy Follow up:   Home Health    Patient appropriate for discharge to prior living situation    Equipment recommendations:   None       Row Name 04/03/22 0900          Goal 1 (Short Term)    Impairment Gait impairment     Gait Patient able to consistently ambulate household distances with least restrictive assistive device with no more than supervision assistance     Goal Status Met       Row Name 04/03/22 0900          GOAL (Long Term)    Long Term Goal Impairment Gait impairment     Long Term Custom Goal To promote mobility within the home and community, patient is able to to navigate steps with unilateral support and/or least restrictive assistive device as needed     Long Term Goal Status Met       Row Name 04/03/22 0900          Treatment Plan Disussion    Treatment Plan Discussion and Agreement Patient/family/caregiver stated understanding and agreement with the therapy plan       Row Name 04/03/22 0900          Treatment Plan    Frequency of treatment Patient appropriate for discharge from therapy     Status of treatment Patient appropriate for discharge from therapy       [place] Name 04/03/22 0900          Discharge Report    Discharge Report Date 04/03/22     Reason for discharge Goals met, no further skilled treatment indicated     Patient participation Excellent, patient participated in all treatment sessions     Patient compliance with therapy program Excellent     Response to therapy Excellent       Row Name 04/03/22 0900          Therapeutic Procedures    Gait Training [phone]) Gait pattern analysis and treatment of deviations;Gait training with varying resistance or speed;Muscle facilitation to address gait deviation(s);Patient education;Postural alighnment/biomechanic training during gait;Weight shift and postural control activities during  gait;Stair/curb/obstacle navigation training;Dynamic activities while walking        Total TIMED Treatment (min)  30       [place] Name 04/03/22 0900          Treatment Time     Total TIMED Treatment  (min) 30     Total Treatment Time (min) 30     Treatment start time 0830                        The physical therapist of record is endorsed by evaluating physical therapist.

## 2022-04-03 NOTE — Interdisciplinary (Signed)
Education provided regarding s/s of infection, how to administer medications.    IV removed, cath intact, no bleeding. Medications delivered to bedside. Pt's home medications returned. All belongings returned.    Pt discharged in NAD and VSS.     Discharged to home.

## 2022-04-03 NOTE — Interdisciplinary (Signed)
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)     ICD-10-CM: Z93.2  ICD-9-CM: V44.2    Relevant Medications    psyllium (METAMUCIL) WAFR    Other Relevant Orders    DME Misc Order: OSTOMY SUPPLIES (Completed)            IP Start of Service  Start of Care: 03/28/22  Onset Date: 03/25/2022    Preferred Language:English         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      Past Surgical History:   Procedure Laterality Date    BRONCHOSCOPY  12/2020    CESAREAN SECTION, LOW TRANSVERSE      COLOSTOMY       CRANIOTOMY      LUNG LOBECTOMY          OT Acute       Row Name 04/03/22 1100          Type of Visit    Type of Occupational Therapy note Occupational Therapy Discharge Summary       Row Name 04/03/22 1100          Treatment Time    Treatment Start Time 0955     Total TIMED Treatment (min) --  23     Total Treatment Time (min) 23       Row Name 04/03/22 1100          Treatment Precautions/Restrictions    Precautions/Restrictions Fall     Fall Socks/charm     Other Precautions/Restrictions Information drain; ostomy       Row Name 04/03/22 1100          Subjective    Subjective information "I'm looking forward to it" (re: anticipated discharge home)     Patient status Patient agreeable to treatment;Nursing in agreement for treatment;Patient pain control adequate to participate in therapy       [place] Name 04/03/22 1100          Pain Assessment  Pain Asssessment Tool Numeric Pain Rating Scale     Other Pain Assessment Information no c/o pain       Row Name 04/03/22 1100          [place] AM-PAC: Daily Activity    Assistance Needed to Put on and Take off Regular Lower Body Clothing 4     Assistance Needed to Bathe, Including Washing, Rinsing, and Drying 3     Assistance Needed to Toilet Environmental manager, Bedpan, or Urinal) 4     Assistance Needed to Put on and Take off Regular Upper Body Clothing 4     Assistance Needed to Take Care of Personal Grooming Such as Brushing Teeth 4     Assistance Needed to Eat Meals 4     AM-PAC Daily Activity Total Score 23     AMP-PAC Daily Activity Impairment rating Score 23 - 1-19% impaired       Row Name 04/03/22 1100          Objective    Overall Cognitive Status Intact - no cognitive limitations or impairments noted     Other Objective Findings ADLs/MRADLs    Grooming: IND, standing at sink.     Toileting: IND to manage/empty colostomy bag in standard toilet.     Lying to Sitting EOB: IND    Sit-to-stand: IND    Functional mobility: IND within room    Pt education: bathing considerations  reviewed; pt provided with plastic covering and paper tape to utilize, if desired/indicated per discharge instructions, for stoma coverage when bathing. Pt verbalized understanding.                       OT Acute Tool Box       Row Name 04/03/22 1100          Cognition Assessment    Overall Cognitive Status Intact - no cognitive limitations or impairments noted                        Eval cont.       [place] Name 04/03/22 1100          Patient/Family Education    Learner(s) Patient     Learner response to rehab patient education interventions Verbalizes understanding;Able to return demonstrate teaching     Patient/family training comments Shower considerations/energy conservation strategies       Row Name 04/03/22 1100          Assessment    Assessment Pt has met all IP OT goals and currently completing all ADLs/MRADLs with SUP-IND; no further skilled intervention indicated. Recommend ongoing participation in ADLs/MRADLs with nursing staff as part of functional maintenance plan.   POST ACUTE REHABILITATION DISCHARGE RECOMMENDATIONS:    Recommended Post-Acute Therapy Follow up:   Home Health    Patient appropriate for discharge to prior living situation    Equipment recommendations:   No equipment needed - patient has own equipment         Rehab Potential Excellent       Row Name 04/03/22 1100          Goal 1 (Short Term)    Impairment Activities of Daily Living - Lower Body Dressing     Activities of Daily Living - Lower Body Dressing Patient able to perform and complete lower body dressing at modified independent/independent level     Goal Status Met;Other (comments)  simulated, not directly observed.  [place] Name 04/03/22 1100          Goal 2 (Short Term)    Impairment Functional ability/mobility - Toileting     Functional ability/mobility - Toileting Patient demonstrates the ability to perform and complete toilet transfers at modified independent/independent level     Goal Status Met       Row Name 04/03/22 1100           GOAL (Long Term)    Long Term Goal Impairment Functional ability/mobility - Toileting     Functional ability/mobility - Toileting Patient demonstrates the ability to perform and complete toilet transfers at modified independent/independent level     Long Term Goal Status Met  IND with all aspects of toileting.       [place] Name 04/03/22 1100          Treatment Plan Disussion    Treatment Plan Discussion and Agreement Patient/family/caregiver stated understanding and agreement with the therapy plan;Patient support system determined and all questions were asked and answered       [place] Name 04/03/22 1100          Treatment Plan    Status of treatment Patient appropriate for discharge from therapy       [place] Name 04/03/22 1100          Patient Safety Considerations     Patient safety considerations Patient left sitting at end of treatment;Call light left in reach and fall precautions in place     Patient assistive device requirements for safe ambulation No device required       [place] Name 04/03/22 1100          Patient Mobilization Recommendations (as tolerated)     Toileting Patient able to use bathroom     Ambulation Patient to ambulate with nursing two times per day shift, once per night shift     Out of bed Patient out of bed to chair for all meals     Device  None needed     Safe patient handling equipment  None needed       [place] Name 04/03/22 1100          Therapy Plan Communication    Therapy Plan Communication Discussed therapy plan with Nursing and/or Physician       [place] Name 04/03/22 1100          Occupational Therapy Patient Discharge Instructions    Your Occupational Therapist suggests the following Continue to complete your home exercise program daily as instructed;Continue to complete your self care Activities of Daily Living as frequently as possible       Row Name 04/03/22 1100          Discharge Report    Discharge Report Date 04/03/22     Reason for discharge Goals met, no further skilled treatment  indicated     Discharge Destination home anticipated; sister available for support/assistance (resides on same property in main house).     Patient participation Excellent, patient participated in all treatment sessions     Patient compliance with therapy program Excellent     Response to therapy Excellent       Row Name 04/03/22 1100          Therapeutic Procedures    Therapeutic Activities ([id number]) Assistance/facilitation of bed mobility;Patient education;Progressive mobilization to improve functional independence         Total TIMED Treatment (min) 8     Self-Care/ADL Training [phone]) Patient education;Bathing;Personal hygiene  Total TIMED Treatment (min) 15                     The occupational therapist of record is endorsed by evaluating occupational therapist.

## 2022-04-03 NOTE — Plan of Care (Signed)
Problem: Promotion of Health and Safety  Goal: Promotion of Health and Safety  Description: The patient remains safe, receives appropriate treatment and achieves optimal outcomes (physically, psychosocially, and spiritually) within the limitations of the disease process by discharge.    Information below is the current care plan.  Outcome: Progressing  Flowsheets  Taken 04/03/2022 0126 by Luis Abed, RN  Individualized Interventions/Recommendations #1:   Monitor blood glucose levels   administer insulin based on sliding scale.  Individualized Interventions/Recommendations #2 (if applicable): Adherence to recommended carbohydrate limited diet.  Individualized Interventions/Recommendations #3 (if applicable): Report pain level outside tolerated patient level.  Individualized Interventions/Recommendations #4 (if applicable): Non-pharmacological strategies to manage pain level.  Individualized Interventions/Recommendations #5 (if applicable): Continue to use abdominal binder and adhere to discharge instructions.  Outcome Evaluation (rationale for progressing/not progressing) every shift: Patient is AOx4, compliant, cooperative, ambulatory, pleasant. ACHS, room air and denies any pain/discomfort. New loop ileostomy, moderate output, abdominal binder in place.  Taken 04/02/2022 1945 by Luis Abed, RN  Patient /Family stated Goal: Discharge tomorrow  Taken 04/02/2022 1251 by Jerline Pain, April, RN  Guidelines: Inpatient Nursing Guidelines  Note:

## 2022-04-03 NOTE — Interdisciplinary (Signed)
04/03/22 0926   Discharge Plan   Is the Patient Medically Stable for Discharge * Yes   CM Discharge Arrangements Complete Yes   CM met with Pt to discuss pending discharge Yes   Pt/family expressed concerns around affording discharge medications No   Pt/family expressed concerns around transportation barriers No   Discussed Following Post-Acute arranged services DME   Post Acute arrangements for Home Health (Pt/family informed of vendor and date/time for services) Yes   Post Acute arrangements for DME (Pt/family informed of vendor and date/time for delivery if appropriate) Yes   Does pt/family have additional discharge concerns No   Living Arrangements on Admission* Family Member   Post Acute Services Referred To [place];Ostomy   Home Health agencies status  Accepted   Ostomy status Accepted   Patient Engaged in Discharge Planning * Yes   Family/Caregiver's Assessed for * Readiness, willingness, and ability to provide or support self-management activities;Readiness to provide care to the patient   Respite Care * Not Applicable   Patient/Family/Other Are In Agreement With Discharge Plan * Yes   Verified phone number for DC location * Yes   Verified address for DC location * Yes   PATIENT CHOICE: Patient/Family/Legal/Surrogate Decision Maker Has Been Given a List Options And Choice In The Selection of Post-Acute Care Providers Yes   CM discussed the following with pt, and/or family, and/or DPOA [facility] has agreements with select post-acute care providers in the collaborative care network;If patient has chosen [facility] or [facility] of [facility] for post-acute care, they were informed that we partner with, and have a financial interest in these organizations   A copy of the [place] Patient Communication, Getting Care at [employer], was provided to patient/DPOA prior to transfer from [place] to outside acute care hospital. Yes   Public Health Clearance Needed * No   If  Medicare or Medicare Managed Care: Important Message from Medicare Given   If Medicare or Medicare Managed Care: Important Message from [facility] Given Not Applicable   Discharge Planning Needs   Actions Needed for Discharge Final [place] care arranged   Do You Have Transportation Issues/Concerns That Make It Difficult To Get To Your Appointments?  No   Primary Family/Caregiver Contact Name, Number and Relationship * Virl Cagey (sister) [phone]   Does this patient have CM discharge planning needs? Yes   CM Needs Met? Yes   Information from Case Management Ostomy supplies will be provided by [facility] Phone: [phone]. They will reach out with 24-48 hours to schedule delivery.   Final Discharge Transportation   Transportation*  Family/Friend   Date 04/03/22   Time 0130   Discharge Transportation Details *  Virl Cagey (sister) [phone] will pick up patient   Final Discharge Destination/Services   Discharge [place]   Final Discharge Destination/Services * Home;Home Health   Home Health/Home [place]   Phone * [phone]     [facility] confirmed with [facility]    Spoke with [facility] Phone: [phone]. They will reach out with 24-48 hours to schedule delivery of ostomy supplies.

## 2022-04-04 ENCOUNTER — Other Ambulatory Visit: Payer: Self-pay

## 2022-04-04 NOTE — Telephone Encounter (Signed)
Population Health Team RN Case Manager Patient Outreach  1st Call Attempt  Pt alerted for follow up appt.     Notes    First TCM call to follow up post discharge from hospital.    Placed call  to pt.  Her sister Stanton Kidney answered. Pt answered the automated call but she is asleep at the  moment.    TCM informed Stanton Kidney this Probation officer could call back this PM.    Plan  Follow up with 2nd attempt phone call     Wetzel Bjornstad, BSN, RN, PCCN-K   [facility]  [email or web address] .[phone]  [phone]

## 2022-04-04 NOTE — Telephone Encounter (Signed)
[facility] post discharge automated outreach has been conducted.      The following items were addressed with the patient during automated call:     Symptoms: feeling better or worse since discharge  Care instructions: has instructions and understands them  Medications:  received all medications, understands use  Appointments:  has appointments scheduled or needs assist to do so  Feedback regarding stay  Transportation assist    The patient identified the following need on the call:   TCM used 2 patient identifiers (name and DOB). Informed the pt that the call is being recorded for quality and training.    Follow Up- Pt's sister Tiffany Chen answered the phone again. She said pt is still asleep. Tiffany Chen is the is the primary contact with permission to contact per IP CM's note on 03/26/22. No gen surgery appt yet.    The following intervention was implemented:    - Provided mary with the phone number of gen surgery clinic. She said she will call the clinic.    Additional note:    Tiffany Chen said pt has no complaint of new or worsening symptoms, no f/c, N/V. Pain is managed by Tylenol. Pt tolerates po, urinates without issues. 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 Pt had a previous colostomy and "well versed" with it. They have the ostomy supplies, aware of ostomy nurse phone number.     Medication-  She had questions, "Is it okay to buy the [place] from [place] "Is it okay to buy chewable calcium supplements?" Is Pedialyte okay to avoid dehydration?" Reviewed they have all new meds, and pt is taking them as prescribed.    Reviewed the following first two appts below. No issues with transportation.    Future Appointments   Date Time Provider [place]   04/15/2022  1:00 PM Damaris Schooner, MD EXE Neuro EXE   04/17/2022 10:00 AM Katheren Shams, MD MOS Pulm Gen MOS   04/29/2022  9:00 AM [facility] Helen Hashimoto [facility] Onc Chem [place]   05/20/2022 12:30 PM Gilda Crease, MD [facility] Ophth [facility]    05/30/2022  1:00 PM Barbaraann Share [facility] Hns Aud [facility]   07/31/2022 10:15 AM Maudie Mercury Bedelia Person, MD UPC Allergy UPC   08/30/2022  2:00 PM Otila Kluver [facility]   11/15/2022  1:00 PM Otila Kluver [facility]       Reviewed reasons to call gen surgery PCP vs reasons to seek emergent care/call 911.  Patient's sister verbalized understanding of instructions provided.     Pt has the AVS and no further questions or concerns.      Wetzel Bjornstad, BSN, RN, PCCN-K  [facility]  [phone]  [phone]

## 2022-04-08 ENCOUNTER — Encounter (INDEPENDENT_AMBULATORY_CARE_PROVIDER_SITE_OTHER): Payer: Medicare Other | Admitting: Nurse Practitioner

## 2022-04-15 ENCOUNTER — Ambulatory Visit (INDEPENDENT_AMBULATORY_CARE_PROVIDER_SITE_OTHER): Payer: Medicare Other | Admitting: Internal Medicine

## 2022-04-16 ENCOUNTER — Ambulatory Visit (HOSPITAL_BASED_OUTPATIENT_CLINIC_OR_DEPARTMENT_OTHER): Payer: Medicare Other | Admitting: Surgery

## 2022-04-16 ENCOUNTER — Ambulatory Visit: Payer: Medicare Other | Attending: Surgery | Admitting: General Surgery

## 2022-04-16 ENCOUNTER — Encounter (HOSPITAL_BASED_OUTPATIENT_CLINIC_OR_DEPARTMENT_OTHER): Payer: Self-pay | Admitting: Surgery

## 2022-04-16 ENCOUNTER — Telehealth (INDEPENDENT_AMBULATORY_CARE_PROVIDER_SITE_OTHER): Payer: Self-pay | Admitting: Surgery

## 2022-04-16 VITALS — BP 149/81 | HR 91 | Temp 96.7°F | Resp 16 | Ht 63.0 in | Wt 143.5 lb

## 2022-04-16 DIAGNOSIS — Z932 Ileostomy status: Secondary | ICD-10-CM

## 2022-04-16 DIAGNOSIS — K439 Ventral hernia without obstruction or gangrene: Secondary | ICD-10-CM

## 2022-04-16 NOTE — Telephone Encounter (Signed)
Procedure:Flexible sigmoidoscopy, possible biopsy, possible polypectomy   DOS: 06/20/22  Surgical Risk Classification System Category: LOW  APC Triage Visit Level:  3NP   Requires APC testing- A1C (If yes, requires in person visit at PMC/MOS for A1C)  Does patient require in-person APP appt? NO         This patient's chart has been reviewed. The APC scheduling team will schedule the appointment. Once appointment is made they will message you back. If no appt slot can be found, patient will be see anesthesia DOS.        Thank you for your patience,    APC Team Members

## 2022-04-16 NOTE — Patient Instructions (Addendum)
Discharge Instructions    Date: 04/16/2022    Location:   [facility] [place]- 3rd floor, [address]  [address]  [place], [place] [address]       Service: [facility] Colorectal Surgery Department     Provider:  Dorian Furnace, MD    Plan of Care:   Please call our surgery scheduling office at [phone] to start the process of scheduling your flexible sigmoidoscopy. Please wait at least 3 business days prior to calling.  There is no bowel prep required. Nothing to eat or drink after midnight.   You may resume regular diet  Follow up with wound ostomy nurse      Colorectal Surgery Appointment and Procedure Scheduling: Please call the Colorectal Surgery Scheduling department at the number listed below to schedule your appointment or surgical procedure. If needed, our authorization department will contact your insurance company to request authorization for your procedure(s). This may take 1-5 business days    Phone [phone]    Fax: [phone]    Questions/Concerns: For questions or concerns about today's appointment, you may contact Registered Nurse Case Manager Piedad Climes @ [phone]     Evenings/Weekends: If you have questions or concerns after hours, please call the hospital operator at [phone] and ask for the general surgery resident on call. He or she will be in communication with your doctor.     Emergencies: If you are experiencing a medical emergency, call 911 or go to the nearest emergency department and ask them to contact your provider at [phone].    We welcome feedback about your care at [facility]. Thank you for sharing your experience! You may submit your comments and feedback by phone or email.    E-mail: [email or web address]   Phone: [phone]     Follow [place] on Twitter [email or web address]      Colostomy and Ileostomy Diet Guidelines  Nutrition is important for your health and healing. Limit fiber for the first 2 weeks  after  surgery. You should avoid fresh fruit and vegetables during this time but can have canned fruit and well-cooked vegetables. You can slowly add back all foods with your doctor's ok after your first pre-op checkup. People with ostomies can still enjoy a normal diet.    Follow these simple food tips:  -Eat meals regularly. You should eat three or more times a day. Small frequent meals may be better tolerated and produce less gas.    -Chew your food thoroughly. Chewing well will help avoid a blockage.    -Eat in moderation and slowly. Too much of any food can cause problems, so eat moderate amounts and eat slowly to allow for proper chewing and digestion. If a new food seems to give you problems, don't eat it for a few weeks, but try it again later.    -Drink plenty of fluid daily. You may lose more body fluids through the ostomy, so you  must stay hydrated. Patients who have lost a large part of their large intestine will notice  more fluid loss. This is because most of the body's fluid is reabsorbed in the large intestine.    -Above all, keep mind that no two people will react the same to foods. You will learn  through trying which foods, if any, you should avoid.    Blockage  Certain foods, if eaten in large amounts may cause blockage.    Use  caution when eating these foods    Eat them in small amounts and be sure to chew them well.    Foods that may cause blockage:  Celery, Coleslaw, Corn, Dried fruits, Meat casings, Mushrooms, Nuts, Peas, Pineapple, Popcorn, Salad greens, Seeds    Reduce gas and odor  Gas is normal but if you feel you are having excess gas, you may try to change your diet to get rid of the problem.  Try these tips:   Eat often. Do not skip meals.    Do not swallow air while eating. Relax and eat slowly.    Avoid chewing gum or drinking through a straw.    Drink 8-10 glasses of water, cranberry juice, or other non-caffeinated drinks.            Foods that may cause gas or odor:   Asparagus,  Apples,Bananas, Beer, Broccoli, Brussels Sprouts, Cabbage, Carbonated drinks, Cauliflower, Corn, Cucumber, Dairy products, Dried beans/peas, Eggs, Fatty foods, Grapes, Green pepper, Melons, Onions, Prunes, Radishes, Turnips    Foods that may help relieve gas and odor:   Yogurt with active cultures, Buttermilk, Cranberry juice, Parsley    Stools  The thickness of your stools depends to a certain extent on where your stoma is placed in your gastrointestinal tract.  When the stoma is higher up in the GI tract, the stools tend to be looser.   In some cases, a loose stool may be the result of eating certain foods.    Foods that may cause loose stools:  Alcoholic drinks  Apple juice  Baked beans  Chocolate  Coffee  Dairy  Grape juice  Green leafy vegetables  Licorice  Prune juice  Spiced foods  Tomatoes    Foods that may help thicken stools:  Applesauce  Bananas  Cheese  Cream of rice  Marshmallows  Mashed potatoes  Peanut butter (creamy)  Rice  Soda crackers  Tapioca  Weak tea    A Special Note for Ileostomy Patients  Those with an ileostomy lose large amounts of salt, potassium and water in the ostomy fluid.  -Losing too much can lead to dehydration.  -Include a number of good sources of sodium and potassium in your daily diet.  -Drink water or sugar free, non-carbonated drinks all day.  -Sports drinks can be used because of their electrolyte content.  -The color of your urine should be clear to pale yellow, if it is darker increase your fluid  intake.    Above all, remember to eat a healthy, well balanced diet and drink plenty of fluids!  Foods that are good sources of potassium:  -Ripe Bananas  -[place] juice  -Tomato juice  -Mashed potatoes    Internet resource for patients who have an internal ileal pouch: www.J-pouch.org  [employer]: www.ostomy.org

## 2022-04-16 NOTE — Progress Notes (Signed)
Pulmonary Clinic Note    This patient was referred by Laverda Page, MD  [address]  [facility] [address]  [place],  [place] [address]    Chief Complaint:  Tiffany Chen is a 77 year old female who is in the clinic today with a chief complaint of follow up.    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     PMH of bronchiectasis, cavitary MAC lung infection, ruptured diverticulitis S/P colostomy in 2009, diabetes, ICH S/P embolization.     MAC history- treatment courses were all azithromycin 500mg , Rifampin 600mg  , ethambutol 1600mg  three times weekly:   - 2018- 01/19/2018  - 05/25/2018- 10/22/2019  - 12/22/2019- 02/16/2020.   - 08/14/2020 started azithro/rifampin/ethambutol PLUS Intravenous amikacin. Amikacin IV replaced by inhaled Arikayce early Novermber 2022. Clofazimine added 02/21/2021    She was being treated by Dr. Berkley Harvey for MAC lung infection. She reports that daily dosing of medication and adding a 4th antibiotic were not discussed before 2022, despite her disease being cavitary.     In late 2021, CT scan showed enlarging cavity. December 2021 a bronch was performed but no MAC growth. Her doctor restarted azithro/Rifampin/ethmabutol based on imaging.   Feb 2022 she saw Dr. Johnnette Barrios who stopped the antibiotics based on the lack of microbiologic data. Performed repeat bronch 04/28/2020 which has smear AFB 3+ and culture MAC (drug susc pending)     Over the last several months her symptoms have consisted of significant daily coughing, causing night time awakening most nights, night sweats every night, and weight loss >30lbs. Also significant fatigue. Dr. Merryl Hacker prescribed nebulized saline but she did not start it.   She tried to walk several miles each day despite her fatigue.     Significant heartburn if she lays shortly after eating.     Miscellaneous:   Never smoker.   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   Her husband died after  contracting COVID in the hospital.   Moved in with her brother in [place] several months ago.   Her brother is Vegan.   No hot tubs, no swimming pools.     Last visit 12/19/2021 with me and 03/06/2022 with Dr. Lendell Caprice:  Continued azithromycin 250mg  daily, rifampin 600mg  daily, Ethambutol 800mg  daily. Inhaled Arikayce daily (replaced the amikacin IV in early Nov 2022). Added clofazimine 100mg  daily on 02/21/2021. S/P lobectomy in 03/20/2021 by CT surgery for removal of the cavitary lesions: (S/P DA VINCI XI-Right Robotic upper lobectomy with middle and lower  lobe wedge resection).   Before the surgery she had had a significant improvement in energy levels and her night sweats had resolved/ Coughing had decreased significantly. She hardly coughs Weight is stable.    03/25/2022:  s/p Open colostomy takedown with low anterior resection and colorectal anastomosis, open splenic flexure mobilization, creation of diverting loop ileostomy, lysis of adhesions, flexible sigmoidoscopy, intraoperative bowel perfusion assessment     Since the abdominal surgery she feels she has recovered as well and has started walking again.     No cough or phlegm.     Airway clearance:   Hypertonic saline    ROS as above, otherwise negative.     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)    Mycobacterium avium complex (CMS-HCC)    s/p Robotic right upper lobectomy with en bloc lower and middle lobe wedge resection, middle lobe wedge resection    Status post Hartmann procedure (CMS-HCC)    Colostomy in place (CMS-HCC)    Ventral hernia without obstruction or gangrene    Colostomy status (CMS-HCC)    Status post cataract extraction and  insertion of intraocular lens of right eye    Status post cataract extraction and insertion of intraocular lens of left eye    Type 2 diabetes mellitus with other specified complication, unspecified whether long term insulin use (CMS-HCC)    Pseudophakia of both eyes    Posterior vitreous detachment    S/P repair of ventral hernia    Ileostomy in place (CMS-HCC)     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     Past Surgical History:   Procedure Laterality Date    BRONCHOSCOPY  12/2020    CESAREAN SECTION, LOW TRANSVERSE      COLOSTOMY      CRANIOTOMY      LUNG LOBECTOMY       Allergies   Allergen Reactions    Walnuts Swelling     Tongue swollen and burn     Current Outpatient Medications on File Prior to Visit   Medication Sig Dispense Refill    acetaminophen (TYLENOL) 325 MG tablet Take 2 tablets (650 mg) by mouth every 8 hours. 100 tablet 0    ARIKAYCE 590 MG/8.4ML SUSP Inhale the contents of one vial (590mg ) via Lamira device 1 time daily as directed. 28 each 5    atorvastatin  (LIPITOR) 10 MG tablet Take 1 tablet (10 mg) by mouth daily. 90 tablet 2    azithromycin (ZITHROMAX) 250 MG tablet TAKE 1 TABLET BY MOUTH  DAILY 60 tablet 11    CLOFAZIMINE IRB [id number] 50 MG CAPSULE Take 2 capsules (100mg ) by mouth once daily with food as directed. 200 capsule 0    ethambutol (MYAMBUTOL) 400 MG tablet TAKE 2 TABLETS BY MOUTH  DAILY 120 tablet 11    ferrous sulfate 325 (65 Fe) MG tablet Take 1 tablet (325 mg) by mouth daily.      fluoxetine (PROZAC) 40 MG capsule Take 1 capsule (40 mg) by mouth daily. 90 capsule 3    metFORMIN (GLUCOPHAGE) 500 mg tablet Take 1 tablet (500 mg) by mouth 2 times daily (with meals). 180 tablet 1    omeprazole (PRILOSEC) 20 MG capsule TAKE  1 CAPSULE BY MOUTH IN  THE MORNING BEFORE  BREAKFAST 90 capsule 3    [DISCONTINUED] oxyCODONE (ROXICODONE) 5 MG immediate release tablet Take 1 tablet (5 mg) by mouth every 4 hours as needed for Severe Pain (Pain Score 7-10). 5 tablet 0    psyllium (METAMUCIL) WAFR Take 2 Wafers by mouth every 12 hours as needed (As needed to maintain ileostomy output <1.2L). 24 packet 0    ramipril (ALTACE) 2.5 MG capsule Take 1 capsule (2.5 mg) by mouth daily. 90 capsule 1    rifAMPin (RIFADIN) 300 MG capsule TAKE 2 CAPSULES BY MOUTH  DAILY 120 capsule 11    sodium chloride 7 % NEBU USE 1 VIAL VIA NEBULIZER  EVERY 12 HOURS 720 mL 3    umeclidinium-vilanterol (ANORO ELLIPTA) 62.5-25 MCG/ACT inhaler Inhale 1 puff by mouth daily. 60 each 11    VENTOLIN HFA 108 (90 Base) MCG/ACT inhaler USE 2 INHALATIONS BY MOUTH  EVERY 6 HOURS AS NEEDED FOR WHEEZING 72 g 3    vitamin D3 125 MCG (5000 UT) capsule Take 1 capsule (5,000 Units) by mouth every 24 hours.       Current Facility-Administered Medications on File Prior to Visit   Medication Dose Route Frequency Provider Last Rate Last Admin    lidocaine (XYLOCAINE) 2 % viscous solution 1 mL  1 mL Other PRN Broderick, Lewanda Rife, MD        lidocaine (XYLOCAINE) 4 % topical   Other PRN Orlin Hilding, Lewanda Rife, MD          Family History   Problem Relation Name Age of Onset    Cancer Mother Denton Lank ngs and throat    Heart Disease Mother Joya Martyr         Congrestive heart failure due to cancer    Hypertension Mother Joya Martyr     Psychiatry Mother Joya Martyr         Bi polar, manic depressive    Cancer Father Jamal Maes         Prostate    Diabetes Father Jamal Maes     Stroke Father Jamal Maes     Stroke M Grandmother Winifred Larita Fife     Cancer Brother Vallarie Mare         Skin (many locations)    Diabetes Brother Vallarie Mare      Social History     Socioeconomic History    Marital status: Widowed   Tobacco Use    Smoking status: Never    Smokeless tobacco: Never   Vaping Use    Vaping status: Never Used   Substance and Sexual Activity    Alcohol use: Yes     Comment: glass of wine every other month    Drug use: Not Currently    Sexual activity: Not Currently   Other Topics Concern    Military Service No    Blood Transfusions No    Caffeine Concern No    Occupational Exposure No    Hobby Hazards Yes     Comment: Gardening, MAC    Sleep Concern No    Stress Concern Yes     Comment: Husband death Cov38, 2018-05-23, sold home, rehomed pets. [place]    Weight Concern No    Special Diet Yes    Back Care No    Exercises Regularly Yes    Bike Helmet Use No    Seat Belt Use No  Performs Self-Exams No   Social History Narrative    Lives with brother Fayrene FearingJames at home. [place]     Social Determinants of Health     Food Insecurity: No Food Insecurity (03/25/2022)    Hunger Vital Sign     Worried About Running Out of Food in the Last Year: Never true     Ran Out of Food in the Last Year: Never true   Transportation Needs: No Transportation Needs (03/25/2022)    PRAPARE - Therapist, artTransportation     Lack of Transportation (Medical): No     Lack of Transportation (Non-Medical): No   Physical Activity: Sufficiently Active (12/17/2021)    Exercise is Medicine (EIM)     Days of Exercise per Week: 5 days     Minutes of Exercise per  Session: 60 min   Intimate Partner Violence: Low Risk  (03/25/2022)    [place] IPV     Have you ever been emotionally or physically abused by your partner or someone important to you?: No     Within the last year have you been hit, slapped, kicked or otherwise physically hurt by someone?: No     Within the last year has anyone forced you to have sexual activities?: No     Are you afraid of your spouse or partner you listed above?: No   Housing Stability: Unknown (03/25/2022)    Housing Stability Vital Sign     Unable to Pay for Housing in the Last Year: No     Unstable Housing in the Last Year: No       Examination:  Vitals:    04/17/22 0945   BP: 130/71   BP Location: Left arm   BP Patient Position: Sitting   BP cuff size: Regular   Pulse: 89   Resp: 18   Temp: 97 F (36.1 C)   TempSrc: Temporal   SpO2: 98%   Weight: 65.5 kg (144 lb 8 oz)   Height: 5\' 3"  (1.6 m)         General: The patient is well appearing, sitting comfortably on the examination table with no evidence of respiratory distress.  Heart: regular rate and rhythm, no murmurs rubs or gallops, normally split S2, no accentuation of P2  Lungs: clear, no wheezing  Skin: no rashes  Neuro: Slight tremor in the left fingers at rest    Laboratory Review:  Lab Results   Component Value Date    WBC 4.8 04/01/2022    HGB 9.5 (L) 04/01/2022    HCT 28.0 (L) 04/01/2022    PLT 263 04/01/2022     Lab Results   Component Value Date    NA 138 04/03/2022    K 4.3 04/03/2022    CL 104 04/03/2022    BICARB 24 04/03/2022    BUN 7 (L) 04/03/2022    CREAT 0.77 04/03/2022    GLU 140 (H) 04/03/2022    [place] 9.1 04/03/2022        Pulmonary Function Test Review:  04/28/2020 (care everywhere): noraml spito, normal volumes, mildly reduced DLCO    Radiology Review:  CT chest 04/13/2020:   CONCLUSION:   1. 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   2. Cavitary lesion in the periphery of the right lung has  increased in size measuring 2.7 cm, although may have a slightly less thickened and nodular wall.   3.  Diffuse  bronchiectasis most pronounced in the right upper lobe.     CT chest 06/22/2021:   IMPRESSION:  1. Post interval right upper lobectomy with a small residual right hydropneumothorax.     2. Scattered tree-in-bud airspace opacities throughout the lungs, similar to slightly improved.  Previously seen patchy groundglass opacities within the lungs appear improved.  3. Mediastinal lymphadenopathy.  This appears slightly increased in comparison to the prior study.    Microbiology  BAL 04/28/2020 (from Dr. Merryl Hacker): AFB smear 3+, culture MAC (drug susceptibly testing sent to [facility]- pending)    Sputum - AFB  08/01/20: AFB smear negative, Cx MAC  08/03/20: AFB smear 1+, Cx MAC (MIS clarithro 0.5, amikacin 4)  08/07/20: AFB smear negative, Cx MAC  10/25: AFB smear 1+, Cx MAC  11/1, 11/09/2020: AFB smear negative, Cx MAC  12/25/2020: AFB smear 2+, Culture MAC  01/30/2021: AFB smear and culture negative  02/06/2021: AFB smear negative, culture heavy MAC  02/12/2021: AFB smear and culture negative  02/15/2021: AFB smear negative, culture moderate MAC  8/22, 08/31/2021: AFB smear negative, culture negative  Sputum-induced 1/8, 01/23/2022: AFB smear and culture negative      Sputum - Routine   08/01/20: NL Resp Flora   08/03/20: NL Resp Flora  08/07/20: NL Resp Flora    EKG   08/23/2020: sinus rhythm with PAC     Impression/Plan:    Problem List:   - cavitary MAC lung infection. 08/14/2020 started azithro/rifampin/ethambutol PLUS Intravenous amikacin. Amikacin IV replaced by inhaled Arikayce early Novermber 2022. Clofazimine added 02/21/2021  - S/P DA VINCI XI-Right Robotic upper lobectomy with middle and lower  lobe wedge resection on 03/20/2021.   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 Amikacin IV replaced by inhaled Arikayce early Novermber 2022. Clofazimine added 02/21/2021. Plan to stop antibiotics on June 1st, 2024. Will be considered successful treatment.   - For reactive airway exacerbation: start anoro ellipta daily, start prednisone 40 mg daily x 5 days, cont albuterol PRN SOB/chest tightness  - Follow up with neurology to discuss tremor, may affect abx duration if felt to be a concerning side effect  - CT chest in May  - referred to pulmonary rehab to assist with recovery from lobectomy. She attended a few sessions then canceled. Preferred to walk daily outdoors.   - EGD with [facility] 02/01/2021 showed significant reflux. Referred to Gen surgery for consideration of surgical intervention to decreae risk of recurrent lung infections.    > Still recommend hiatal hernia repair and fundoplication at time of next abdominal surgery. Her cavitary MAC lung infection was severe and complicated and anything that would reduce the chance of recurrence would be highly recommended.   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 Encouraged exercise.   - Follow up 2 months with NP Geraldine Contras and 4 months with me      Patient Instructions   -continue to take the MAC Antibiotics (azithromycin, rifampin, ethambutol, inhaled arikayce and clofazimine). Plan to stop all antibiotics on June 1st.   -  schedule a CT scan to be done in May  - schedule 2-3 sputum inductions to be in August  -continue the nebulized saline to clean your lungs  -keep up the physical activity-this is a natural way to clean your lungs        Continue  the monitoring and testing while on the MAC antibiotics  -blood labs monthly. Due in May , then no need for more from our standpoint  -EKG-every 3-4 months. No more need to do anymore   -Audiogram scheduled 5/23 - no more need after that  -[place] Eye Color Discrimination Testing each eye at home twice a week- you can stop after you stop the antibiotics  -follow up with Ledon Np 6/11 and with me August    We highly recommend attending this patient conference on bronchiectasis and MAC lung infection: Home [email or web address])       Total time spent :   - with patient, examining, history taking, planning management, counseling  - reviewing images  - records review  - interpretation of results  - documentation       Orders:  No orders of the defined types were placed in this encounter.    Orders Placed This Encounter   Procedures    CT Chest W/O Contrast     Orders Placed This Encounter   Procedures    Induced Sputum Lab Service Request

## 2022-04-16 NOTE — Telephone Encounter (Signed)
Tiffany Chen [birth year] [id number]    APC Triage Tool Visit Result: APP Video Visit   Translation Needed: NO  DOS: 06/20/22  Surgeon: Roosvelt Harps  Status: Outpatient       Patient needs APC appointment 2-5 weeks from DOS. No appointments available.

## 2022-04-16 NOTE — Patient Instructions (Addendum)
-  continue to take the MAC Antibiotics (azithromycin, rifampin, ethambutol, inhaled arikayce and clofazimine). Plan to stop all antibiotics on June 1st.   - schedule a CT scan to be done in May  - schedule 2-3 sputum inductions to be in August  -continue the nebulized saline to clean your lungs  -keep up the physical activity-this is a natural way to clean your lungs        Continue the monitoring and testing while on the MAC antibiotics  -blood labs monthly. Due in May , then no need for more from our standpoint  -EKG-every 3-4 months. No more need to do anymore   -Audiogram scheduled 5/23 - no more need after that  -[place] Eye Color Discrimination Testing each eye at home twice a week- you can stop after you stop the antibiotics  -follow up with Ledon Np 6/11 and with me August    We highly recommend attending this patient conference on bronchiectasis and MAC lung infection: Home ([email or web address])

## 2022-04-16 NOTE — Progress Notes (Signed)
History and Physical     Referring MD:  Monia Sabal, MD  [address]  [place],  [place] [address]    History of Present Illness:     Tiffany Chen is a 77 year old female who is here for evaluation.  Patient with history of open Hartman procedure for perforated diverticulitis in 2009.  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  Colostomy functioning well and appliance seating well without leakage.  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 Chestine Spore in March 2023.  Patient recovered well.  Currently being evaluated by Dr. Orlin Hilding for paraesophageal hernia as possible cause of chronic reflux with micro aspirations    I have independently reviewed and interpreted the following imaging findings and agree with radiologist's interpretation except as stated below and discussed them with the patient.    CT Ch/A/P 06/22/21:  No leak at the [facility] pouch     Large peristomal hernia with a wide orifice    1. Post interval right upper lobectomy with a small residual right hydropneumothorax.      2. Scattered tree-in-bud airspace opacities throughout the lungs, similar to slightly improved.  Previously seen patchy groundglass opacities within the lungs appear improved.    3. Mediastinal lymphadenopathy.  This appears slightly increased in comparison to the prior study    Interval History and Exam on 04/16/22:  Patient s/p open colostomy takedown with low anterior resection and colorectal anastomosis, open splenic flexure mobilization, creation of diverting loop ileostomy, lysis of adhesions, flexible sigmoidoscopy, intraoperative bowel perfusion assessment on 03/25/22.  Patient tolerating diet.  Denies abdominal pain.  Ostomy with applesauce output, empyting 3-5 times per day.  On low fiber diet.  Occasional mucus passage via rectum. Denies signs/symptoms of  dehydration    Pathology:   A: Rectum, resection:   -Colonic tissue with focal mucin extravasation in the subserosa and serosal   adhesion.   -No dysplasia or malignancy identified.   -Viable resection margins.   B: Colon, resection with colostomy:   -Colocutaneous tissue with diverticulum and mild chronic inflammation.   -Pericolonic fibroadipose tissue with congestion and hemorrhage.   -Viable resection margins.   C: Omentum, resection:   -Benign fibroadipose tissue with congestion and hemorrhage.       No chief complaint on file.       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     Patient denies history of: colorectal cancer, Crohn's disease and  ulcerative colitis    Past Surgical History:   Procedure Laterality Date    BRONCHOSCOPY  12/2020    CESAREAN SECTION, LOW TRANSVERSE      COLOSTOMY      CRANIOTOMY      LUNG LOBECTOMY         Allergies   Allergen Reactions    Walnuts Swelling     Tongue swollen and burn     Current Outpatient Medications   Medication Sig    acetaminophen  (TYLENOL) 325 MG tablet Take 2 tablets (650 mg) by mouth every 8 hours.    ARIKAYCE 590 MG/8.4ML SUSP Inhale the contents of one vial (590mg ) via Lamira device 1 time daily as directed.    atorvastatin (LIPITOR) 10 MG tablet Take 1 tablet (10 mg) by mouth daily.    azithromycin (ZITHROMAX) 250 MG tablet TAKE 1 TABLET BY MOUTH  DAILY    CLOFAZIMINE IRB 21-0063 50 MG CAPSULE Take 2 capsules (100mg ) by mouth once daily with food as directed.    ethambutol (MYAMBUTOL) 400 MG tablet TAKE 2 TABLETS BY MOUTH  DAILY    ferrous sulfate 325 (65 Fe) MG tablet Take 1 tablet (325 mg) by mouth daily.    fluoxetine (PROZAC) 40 MG capsule Take 1 capsule (40 mg) by mouth daily.    metFORMIN (GLUCOPHAGE) 500 mg tablet Take 1 tablet (500 mg) by mouth 2 times daily (with meals).    omeprazole (PRILOSEC) 20 MG capsule TAKE 1 CAPSULE BY MOUTH IN  THE MORNING BEFORE  BREAKFAST    oxyCODONE (ROXICODONE) 5 MG immediate release tablet Take 1 tablet (5 mg) by mouth every 4 hours as needed for Severe Pain (Pain Score 7-10).    psyllium (METAMUCIL) WAFR Take 2 Wafers by mouth every 12 hours as needed (As needed to maintain ileostomy output <1.2L).    ramipril (ALTACE) 2.5 MG capsule Take 1 capsule (2.5 mg) by mouth daily.    rifAMPin (RIFADIN) 300 MG capsule TAKE 2 CAPSULES BY MOUTH  DAILY    sodium chloride 7 % NEBU USE 1 VIAL VIA NEBULIZER  EVERY 12 HOURS    umeclidinium-vilanterol (ANORO ELLIPTA) 62.5-25 MCG/ACT inhaler Inhale 1 puff by mouth daily.    VENTOLIN HFA 108 (90 Base) MCG/ACT inhaler USE 2 INHALATIONS BY MOUTH  EVERY 6 HOURS AS NEEDED FOR WHEEZING    vitamin D3 125 MCG (5000 UT) capsule Take 1 capsule (5,000 Units) by mouth every 24 hours.     Current Facility-Administered Medications   Medication    lidocaine (XYLOCAINE) 2 % viscous solution 1 mL    lidocaine (XYLOCAINE) 4 % topical       Social History     Socioeconomic History    Marital status: Widowed   Tobacco Use    Smoking status: Never    Smokeless tobacco: Never   Vaping  Use    Vaping status: Never Used   Substance and Sexual Activity    Alcohol use: Yes     Comment: glass of wine every other month    Drug use: Not Currently    Sexual activity: Not Currently   Social Activities of Daily Living Present    Military Service No    Blood Transfusions No    Caffeine Concern No    Occupational Exposure No    Hobby Hazards Yes     Comment: Gardening, MAC    Sleep Concern No    Stress Concern Yes     Comment: Husband death 66Cov19,  2020, sold home, rehomed pets. [place]    Weight Concern No    Special Diet Yes    Back Care No    Exercises Regularly Yes    Bike Helmet Use No    Seat Belt Use No    Performs Self-Exams No   Social History Narrative    Lives with brother Fayrene Fearing at home. Sister also lives in [place]     Family History   Problem Relation Name Age of Onset    Cancer Mother Denton Lank ngs and throat    Heart Disease Mother Joya Martyr         Congrestive heart failure due to cancer    Hypertension Mother Joya Martyr     Psychiatry Mother Joya Martyr         Bi polar, manic depressive    Cancer Father Jamal Maes         Prostate    Diabetes Father Jamal Maes     Stroke Father Jamal Maes     Stroke M Grandmother Winifred Larita Fife     Cancer Brother Vallarie Mare         Skin (many locations)    Diabetes Brother Vallarie Mare        Patient denies family history of: colorectal cancer, Crohn's disease and ulcerative colitis    Review of Systems: A complete review of systems was performed and is negative except as documented in HPI.    Physical Exam:  There were no vitals taken for this visit.  General Appearance: healthy, alert, no distress, pleasant affect, cooperative, skin warm, dry, and pink.  Eyes:  Conjunctivae and corneas clear. EOM's intact. Fundi benign.  Mouth: Normal.  Neck:  Neck supple. No adenopathy.  Heart:  Normal rate and regular rhythm  Lungs: No increased work of breathing.  Abdomen:soft, non-tender, non-distended, well-healed midline scar, right sided ileostomy pink and  patent   Extremities:  No cyanosis, clubbing, or edema.  Skin:  Negative.        Assessment and Care Plan:    ICD-10-CM ICD-9-CM    1. 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  Will evaluate integrity of colorectal anastomosis with flexible sigmoidoscopy followed by gastrografin enema      The risks, benefits and alternatives of the operation were discussed with the patient.  The risks included but not limited to scope-related complications including bleeding and perforation requiring emergency surgery.    Plan for flexible sigmoidoscopy followed by gastrograffin enema  Ostomy nurse consultation for education  RTC following work-up to discuss ileostomy takedown    Note Author: Dorian Furnace, MD

## 2022-04-17 ENCOUNTER — Encounter (HOSPITAL_BASED_OUTPATIENT_CLINIC_OR_DEPARTMENT_OTHER): Payer: Self-pay | Admitting: Critical Care Medicine

## 2022-04-17 ENCOUNTER — Ambulatory Visit: Payer: Medicare Other | Attending: Internal Medicine | Admitting: Critical Care Medicine

## 2022-04-17 VITALS — BP 130/71 | HR 89 | Temp 97.0°F | Resp 18 | Ht 63.0 in | Wt 144.5 lb

## 2022-04-17 DIAGNOSIS — A319 Mycobacterial infection, unspecified: Secondary | ICD-10-CM | POA: Insufficient documentation

## 2022-04-17 DIAGNOSIS — J479 Bronchiectasis, uncomplicated: Secondary | ICD-10-CM | POA: Insufficient documentation

## 2022-04-17 DIAGNOSIS — J984 Other disorders of lung: Secondary | ICD-10-CM | POA: Insufficient documentation

## 2022-04-17 DIAGNOSIS — A310 Pulmonary mycobacterial infection: Secondary | ICD-10-CM | POA: Insufficient documentation

## 2022-04-17 NOTE — Telephone Encounter (Signed)
Pt is scheduled, pt is aware

## 2022-04-18 NOTE — Progress Notes (Signed)
Minimally Invasive Surgery Post op Visit  Date: April 18, 2022     Patient Name: Tiffany Chen   Medical Record #: [id number]   DOB: 1945-05-25  Age: 77 year old  Sex: female    Referring MD: [name], MD  [address]  [facility] [address]  [place],  [place] [address]    PCP: Monia Sabal    Reason for Visit:   Chief Complaint   Patient presents with    Post-op Visit       History of Present Illness:     Tiffany Chen is a 77 year old female, Body mass index is 25.42 kg/m., who is here for post-op check after colostomy takedown and open ventral hernia repair with mesh. Tiffany Chen has not yet had antireflux surgery due to difficulty and hemodynamics during previous operation.     Tiffany Chen is recovering well. Feels like improving, just slowly. Is pleased with cosmetic outcome of hernia repair thus far. No issues with ostomy. Eating, drinking, and voiding normally.       From previous notes:   "Tiffany Chen has a history of MAC lung infections s/p right upper lobectomy with Dr. Chestine Spore in 03/2021 and there is some concern that the GERD is contributing to the MAC infections.     Was last seen in clinic on 6/6 at which time Tiffany Chen was recommended to undergo barium swallow, manometry, and CT with IV and rectal contrast then return to clinic to discuss surgical options again.     Her manometry was found to be overall normal. Barium swallow not yet completed, but is scheduled for August 1st. Patient states that Tiffany Chen continues to be asymptomatic from her reflux as long as Tiffany Chen takes her PPI. Tiffany Chen is overall doing well from a respiratory standpoint. Tiffany Chen does endorse some discomfort from the parastomal hernia and is interested in colostomy takedown and hernia repair. Would prefer not to undergo anti-reflux surgery unless indicated as treatment against MAC infections."    Have discussed with Dr. Rinaldo Ratel who highly recommends antireflux surgery to help prevent recurrent MAC.    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 Chestine Spore in  03/2021 for MAC    Allergies  Allergies   Allergen Reactions    Walnuts Swelling     Tongue swollen and burn       Medications  Current Outpatient Medications   Medication Sig Dispense Refill    acetaminophen (TYLENOL) 325 MG tablet Take 2 tablets (650 mg) by mouth every 8 hours. 100 tablet 0    ARIKAYCE 590 MG/8.4ML SUSP Inhale the contents of one vial (590mg ) via Lamira device 1 time daily as directed. 28 each 5    atorvastatin (LIPITOR) 10 MG tablet Take 1 tablet (10 mg) by mouth daily. 90 tablet 2    azithromycin (ZITHROMAX) 250 MG tablet TAKE 1 TABLET BY MOUTH  DAILY 60 tablet 11    CLOFAZIMINE IRB 21-0063 50 MG CAPSULE Take 2 capsules (100mg ) by mouth once daily with food  as directed. 200 capsule 0    ethambutol (MYAMBUTOL) 400 MG tablet TAKE 2 TABLETS BY MOUTH  DAILY 120 tablet 11    ferrous sulfate 325 (65 Fe) MG tablet Take 1 tablet (325 mg) by mouth daily.      fluoxetine (PROZAC) 40 MG capsule Take 1 capsule (40 mg) by mouth daily. 90 capsule 3    metFORMIN (GLUCOPHAGE) 500 mg tablet Take 1 tablet (500 mg) by mouth 2 times daily (with meals). 180 tablet 1    omeprazole (PRILOSEC) 20 MG capsule TAKE 1 CAPSULE BY MOUTH IN  THE MORNING BEFORE  BREAKFAST 90 capsule 3    psyllium (METAMUCIL) WAFR Take 2 Wafers by mouth every 12 hours as needed (As needed to maintain ileostomy output <1.2L). 24 packet 0    ramipril (ALTACE) 2.5 MG capsule Take 1 capsule (2.5 mg) by mouth daily. 90 capsule 1    rifAMPin (RIFADIN) 300 MG capsule TAKE 2 CAPSULES BY MOUTH  DAILY 120 capsule 11    sodium chloride 7 % NEBU USE 1 VIAL VIA NEBULIZER  EVERY 12 HOURS 720 mL 3    umeclidinium-vilanterol (ANORO ELLIPTA) 62.5-25 MCG/ACT inhaler Inhale 1 puff by mouth daily. 60 each 11    VENTOLIN HFA 108 (90 Base) MCG/ACT inhaler USE 2 INHALATIONS BY MOUTH  EVERY 6 HOURS AS NEEDED FOR WHEEZING 72 g 3    vitamin D3 125 MCG (5000 UT) capsule Take 1 capsule (5,000 Units) by mouth every 24 hours.       Current Facility-Administered Medications   Medication Dose Route Frequency Provider Last Rate Last Admin    lidocaine (XYLOCAINE) 2 % viscous solution 1 mL  1 mL Other PRN Litha Lamartina, Lewanda Rife, MD        lidocaine (XYLOCAINE) 4 % topical   Other PRN Lyndell Gillyard, Lewanda Rife, MD           Social History  Social History     Socioeconomic History    Marital status: Widowed   Tobacco Use    Smoking status: Never    Smokeless tobacco: Never   Vaping Use    Vaping status: Never Used   Substance and Sexual Activity    Alcohol use: Yes     Comment: glass of wine every other month    Drug use: Not Currently    Sexual activity: Not Currently   Social Activities of Daily Living Present    Military Service No    Blood Transfusions  No    Caffeine Concern No    Occupational Exposure No    Hobby Hazards Yes     Comment: Gardening, MAC  Sleep Concern No    Stress Concern Yes     Comment: Husband death Cov59, 05/10/2018, sold home, rehomed pets. [place]    Weight Concern No    Special Diet Yes    Back Care No    Exercises Regularly Yes    Bike Helmet Use No    Seat Belt Use No    Performs Self-Exams No   Social History Narrative    Lives with brother Fayrene Fearing at home. Sister also lives in [place]       Family History  Family History   Problem Relation Name Age of Onset    Cancer Mother Denton Lank ngs and throat    Heart Disease Mother Joya Martyr         Congrestive heart failure due to cancer    Hypertension Mother Joya Martyr     Psychiatry Mother Joya Martyr         Bi polar, manic depressive    Cancer Father Jamal Maes         Prostate    Diabetes Father Jamal Maes     Stroke Father Jamal Maes     Stroke M Grandmother Winifred Larita Fife     Cancer Brother Vallarie Mare         Skin (many locations)    Diabetes Brother Vallarie Mare        Review of Systems    As described in HPI.    Physical Examination  BP 149/81 (BP Location: Right arm, BP Patient Position: Sitting, BP cuff size: Regular)   Pulse 91   Temp 96.7 F (35.9 C) (Temporal)   Resp 16   Ht 5\' 3"  (1.6 m)   Wt 65.1 kg (143 lb 8 oz)   SpO2 97%   BMI 25.42 kg/m  Body mass index is 25.42 kg/m.  GENERAL: alert, oriented x 3, no acute distress   CHEST: unlabored respirations   ABDOMEN: soft, non tender, loop ileostomy ppp, midline incision and site of prior colostomy c/d/I without erythema nor drainage    Diagnostic Testing/Labs:  Lab Results   Component Value Date    WBC 4.8 04/01/2022    RBC 3.03 (L) 04/01/2022    HGB 9.5 (L) 04/01/2022    HCT 28.0 (L) 04/01/2022    MCV 92.4 04/01/2022    MCHC 33.9 04/01/2022    RDW 13.2 04/01/2022    PLT 263 04/01/2022    MPV 9.0 (L) 04/01/2022    SEG 64 04/01/2022    LYMPHS 19 04/01/2022    MONOS 11 04/01/2022    EOS 5 04/01/2022    BASOS 1 04/01/2022      Lab Results   Component Value Date    BUN 7 (L) 04/03/2022    CREAT 0.77 04/03/2022    CL 104 04/03/2022    NA 138 04/03/2022    K 4.3 04/03/2022    [place] 9.1 04/03/2022    TBILI 0.23 03/07/2022    ALB 4.1 03/07/2022    TP 6.9 03/07/2022    AST 25 03/07/2022    ALK 62 03/07/2022    BICARB 24 04/03/2022    ALT 24 03/07/2022    GLU 140 (H) 04/03/2022     Lab Results   Component Value Date    NA 138 04/03/2022    K 4.3 04/03/2022    CL 104 04/03/2022    BICARB 24 04/03/2022    BUN 7 (L)  04/03/2022    CREAT 0.77 04/03/2022    GLU 140 (H) 04/03/2022    [place] 9.1 04/03/2022     Lab Results   Component Value Date    A1C 6.3 (H) 03/28/2022     Lab Results   Component Value Date    CHOL 163 05/16/2021    HDL 53 05/16/2021    LDLCALC 81 05/16/2021    TRIG 145 05/16/2021     No results found for: "TSH"      Imaging/Procedures:    Manometry completed 6/27  Barium swallow scheduled for 8/1    EGD with Bravo: 13.2% acid exposure with demeesters 40-60.      X-Ray Abdomen Post Tube Placement    Result Date: 04/08/2022  IMPRESSION: 1. Suction-type enteric tube, coursing below the diaphragm, with side-port projecting over the at or just beyond the GE junction , with tip projecting over the gastric fundus. 2. Partially imaged surgical drain projects over the left lower quadrant of the abdomen. 3. Nonobstructive bowel gas pattern.     X-Ray Chest Single View    Result Date: 03/31/2022  IMPRESSION: No definite acute findings. Bronchial wall thickening which can be seen with large airways disease.    X-Ray Abdomen Single View    Result Date: 03/29/2022  IMPRESSION: Nonobstructive bowel gas pattern.     X-Ray Abdomen Post Tube Placement    Result Date: 03/28/2022  IMPRESSION: Suction-type enteric tube, coursing below the diaphragm, with side-port projecting over the gastric body , with tip projecting over the gastric body. Nonspecific paucity of bowel gas.       Assessment/Plan:  Tiffany Chen is a 77 year old female, Body mass index is  25.42 kg/m., with asymptomatic GERD with history of RUL MAC requiring lobectomy as well as a symptomatic parastomal hernia:    Doing well post-op from ostomy takedown and ventral hernia repair.    Discussed that Tiffany Chen should continue to recover and build strength. Will plan to perform lap hiatal hernia repair with fundoplication at time of ileostomy takedown. Will coordinate timing with Dr. Tonita Phoenix.

## 2022-04-24 ENCOUNTER — Telehealth (HOSPITAL_BASED_OUTPATIENT_CLINIC_OR_DEPARTMENT_OTHER): Payer: Self-pay

## 2022-04-24 NOTE — Telephone Encounter (Signed)
Date changed to 4/22. Orders were confirmed in signed status.

## 2022-04-24 NOTE — Telephone Encounter (Signed)
Dr. Selena Batten,     [name] is scheduled for infusion of IVIG on 4/22.     Please review the treatment plan and change the date to reflect patient's scheduled appointment on 4/22    Patient has a scheduled clinic visit on NONE.    Thank you,     Laverle Patter, RN

## 2022-04-26 ENCOUNTER — Other Ambulatory Visit: Payer: Medicare Other | Attending: Critical Care Medicine

## 2022-04-26 DIAGNOSIS — D809 Immunodeficiency with predominantly antibody defects, unspecified: Secondary | ICD-10-CM | POA: Insufficient documentation

## 2022-04-26 DIAGNOSIS — A319 Mycobacterial infection, unspecified: Secondary | ICD-10-CM

## 2022-04-26 LAB — COMPREHENSIVE METABOLIC PANEL, BLOOD
ALT (SGPT): 21 U/L (ref 0–33)
AST (SGOT): 22 U/L (ref 0–32)
Albumin: 4.3 g/dL (ref 3.5–5.2)
Alkaline Phos: 78 U/L (ref 40–130)
Anion Gap: 11 mmol/L (ref 7–15)
BUN: 18 mg/dL (ref 8–23)
Bicarbonate: 25 mmol/L (ref 22–29)
Bilirubin, Tot: 0.43 mg/dL (ref <1.2)
Calcium: 10 mg/dL (ref 8.5–10.6)
Chloride: 100 mmol/L (ref 98–107)
Creatinine: 0.84 mg/dL (ref 0.51–0.95)
Glucose: 221 mg/dL — ABNORMAL HIGH (ref 70–99)
Potassium: 4.3 mmol/L (ref 3.5–5.1)
Sodium: 136 mmol/L (ref 136–145)
Total Protein: 6.6 g/dL (ref 6.0–8.0)
eGFR Based on CKD-EPI 2021 Equation: >60 mL/min/{1.73_m2}

## 2022-04-26 LAB — CBC WITH DIFF, BLOOD
ANC-Automated: 3.2 10*3/uL (ref 1.6–7.0)
ANC-Instrument: 3.2 10*3/uL (ref 1.6–7.0)
Abs Basophils: 0 10*3/uL (ref <0.2)
Abs Eosinophils: 0.2 10*3/uL (ref 0.0–0.5)
Abs Lymphs: 1.3 10*3/uL (ref 0.8–3.1)
Abs Monos: 0.6 10*3/uL (ref 0.2–0.8)
Basophils: 0.7 %
Eosinophils: 4.4 %
Hct: 37.3 % (ref 34.0–45.0)
Hgb: 12.7 gm/dL (ref 11.2–15.7)
Imm Gran %: 0.4 % (ref <1)
Lymphocytes: 23.9 %
MCH: 31.6 pg (ref 26.0–32.0)
MCHC: 34 g/dL (ref 32.0–36.0)
MCV: 92.8 um3 (ref 79.0–95.0)
MPV: 10.8 fL (ref 9.4–12.4)
Monocytes: 11.2 %
Plt Count: 252 10*3/uL (ref 140–370)
RBC: 4.02 10*6/uL (ref 3.90–5.20)
RDW: 12.9 % (ref 12.0–14.0)
Segs: 59.4 %
WBC: 5.5 10*3/uL (ref 4.0–10.0)

## 2022-04-26 LAB — IGG, BLOOD: IGG: 894 mg/dL (ref 700–1600)

## 2022-04-29 ENCOUNTER — Ambulatory Visit
Admission: RE | Admit: 2022-04-29 | Discharge: 2022-04-29 | Disposition: A | Discharge status: Home / Self Care | Payer: Medicare Other | Attending: Allergy | Admitting: Allergy

## 2022-04-29 VITALS — BP 136/73 | HR 85 | Temp 97.9°F | Resp 16 | Wt 144.2 lb

## 2022-04-29 DIAGNOSIS — D809 Immunodeficiency with predominantly antibody defects, unspecified: Secondary | ICD-10-CM | POA: Insufficient documentation

## 2022-04-29 LAB — GLUCOSE (POCT): Glucose (POCT): 172 mg/dL — ABNORMAL HIGH (ref 70–99)

## 2022-04-29 MED ORDER — ACETAMINOPHEN 325 MG PO TABS
650.0000 mg | ORAL_TABLET | Freq: Once | ORAL | Status: AC
Start: 2022-04-29 — End: 2022-04-29
  Administered 2022-04-29: 650 mg via ORAL
  Filled 2022-04-29: qty 2

## 2022-04-29 MED ORDER — DEXTROSE 5 % IV SOLN
INTRAVENOUS | Status: DC
Start: 2022-04-29 — End: 2022-04-29

## 2022-04-29 MED ORDER — IMMUNE GLOBULIN (HUMAN) 20 GM/200ML IJ SOLN
0.5000 g/kg | Freq: Once | INTRAMUSCULAR | Status: AC
Start: 2022-04-29 — End: 2022-04-29
  Administered 2022-04-29: 25 g via INTRAVENOUS
  Filled 2022-04-29: qty 200

## 2022-04-29 MED ORDER — CETIRIZINE HCL 10 MG OR TABS
10.0000 mg | ORAL_TABLET | Freq: Once | ORAL | Status: AC
Start: 2022-04-29 — End: 2022-04-29
  Administered 2022-04-29: 10 mg via ORAL
  Filled 2022-04-29: qty 1

## 2022-04-29 NOTE — Interdisciplinary (Signed)
Non-Chemotherapy Infusion Nursing Note - [facility] Tiffany Chen is a 77 year old female who presents for infusion of IVIG 25G.     Vitals:    04/29/22 0852 04/29/22 1130   BP: 134/72 136/73   BP Location: Left arm Left arm   BP Patient Position: Sitting Sitting   Pulse: 82 85   Resp: 17 16   Temp: 96.4 F (35.8 C) 97.9 F (36.6 C)   TempSrc: Temporal Temporal   SpO2: 96%    Weight: 65.4 kg (144 lb 2.9 oz)      Pain Score: NA (pre med, non-pain or scheduled)  Body surface area is 1.71 meters squared.  Body mass index is 25.54 kg/m.    Pre-treatment nursing assessment:  Unexpected findings: Patient endorses DOE w/ cold weather (baseline w/ MAC), very fatigued. Patient requested POCT BG, 172. Patient states that she has had difficulty controlling BG since surgery last month. Tiffany Sheldon NP notified. Provided number of Medicare to help set up glucometer and diabetes supplies at home.     R AC 24G PIV x1 attempt + BR. No labs to be collected today.     Treatment parameters: N/A    Medications   dextrose 5% infusion ( IntraVENOUS Not given 04/29/22 0915)   acetaminophen (TYLENOL) tablet 650 mg (650 mg Oral Given 04/29/22 0903)   cetirizine (ZYRTEC) tablet 10 mg (10 mg Oral Given 04/29/22 0903)   immune globulin (human) (IgG) 25 g in 250 mL (GAMUNEX-C) infusion (0 g IntraVENOUS Completed 04/29/22 1130)       Tiffany Chen tolerated treatment well. Titrated to max rate as ordered. VSS, no s/s infusion rxn.   Post blood return: Brisk  Post-Flush: NS and Discontinued IV    Patient Education  Learner: Patient  Barriers to learning: No Barriers  Readiness to learn: Acceptance  Method: Explanation    Treatment Education: Information/teaching given to patient including: signs and symptoms of infection, bleeding, adverse reaction(s), symptom control, and when to notify MD.    [name] Prevention Education: Instructed patient to call for assistance.    Pain Education: Patient instructed to contact nurse if  pain should develop or if their current pain therapy becomes ineffective.    Response: Verbalizes understanding    Discharge Plan  Discharge instructions given to patient.  Future appointment requested 5/20.   Discharge Mode: Ambulatory  Discharge Time: 1130  Accompanied by: Self  Discharged To: Home

## 2022-05-02 ENCOUNTER — Ambulatory Visit (INDEPENDENT_AMBULATORY_CARE_PROVIDER_SITE_OTHER): Payer: Self-pay

## 2022-05-02 NOTE — Telephone Encounter (Signed)
Symptom Call         What symptom is the patient experiencing? Home health nurse and Patient calling stating patient has elevated bs 150-200 and having Malay.  Is this a new or ongoing symptom? ongoing  When did the symptom(s) begin? 2weeks   Who is reporting the symptoms?  Home health nurse     Insurance Coverage Verified: yes  Insurance Plan Name:  [place]   Assigned to [place]:   Next office visit:  Visit date not found  Did you offer Express Care/Urgent Care:   Transferred call to: Cold Symptoms Line (ext [id number])    Best way to contact patient: Phone: [phone]

## 2022-05-02 NOTE — Telephone Encounter (Signed)
PROVIDER/CLINIC ACTION REQUESTED: Yes  Action item needed:  case manager asking for order for a glucometer or to see if she qualifies for the [employer]  glucose monitoring.  Appt scheduled:   Future Appointments   Date Time Provider Department Center   05/03/2022 10:30 AM Roque Cash, NP [facility]   Disposition: Home Care    COVID Testing Status: n/a   COVID TRIAGE PROTOCOL: n/a     Chief Complaint  blood glucose monitoring and fatigue x 2 weeks  Assessment details:  Alma RN Case Manager with [facility].  BG: 184  Noted blood glucose 150s-200 since being hospitalized and after discharge from hospital.  pt doesn't have glucometer. Per pt, blood glucose was not as high when it was checked prior to surgery. Between 100-120.  Other sx: moderate fatigue x2 weeks. Pt stays in bed all day, gets up for an hour and will go back to sleep and can still sleep throughout the night.   Pt states she has been staying hydrated.  Med metformin  Hx: DM2, hernia repair on 3/18.    Intervention: Advice given, Acute appt scheduled, and pt given strict [place]/ED precautions and reviewed all applicable Home Care Advice per protocol.     COVID VACCINATION STATUS:   Current Covid Vaccinations   Administered Date(s) Administered    COVID-19 (Moderna) Bivalent Vaccine >= 12 years 12/09/2020    COVID-19 (Moderna) Dark Blue Cap/Blue Label Vaccine >= 12 years, 2023-24 10/09/2021    COVID-19 (Moderna) Red Cap >= 12 Years 01/12/2019, 02/09/2019, 10/21/2019, 04/08/2020     Reason for Call: High Blood Sugar             Reason for Disposition   Blood glucose 70-240 mg/dL (3.9 -16.1 mmol/L)    Additional Information   Negative: Unconscious or difficult to awaken   Negative: Acting confused (e.g., disoriented, slurred speech)   Negative: Very weak (e.g., can't stand)   Negative: Sounds like a life-threatening emergency to the triager   Negative: [1] Vomiting AND [2] signs of dehydration (e.g., very dry mouth, lightheaded, dark  urine)   Negative: [1] Blood glucose > 240 mg/dL (09.6 mmol/L) AND [0] rapid breathing   Negative: Blood glucose > 500 mg/dL (45.4 mmol/L)   Negative: [1] Blood glucose > 240 mg/dL (09.8 mmol/L) AND [1] urine ketones moderate-large (or more than 1+)   Negative: [1] Blood glucose > 240 mg/dL (19.1 mmol/L) AND [4] blood ketones > 1.4 mmol/L   Negative: [1] Blood glucose > 240 mg/dL (78.2 mmol/L) AND [9] vomiting AND [3] unable to check for ketones (in blood or urine)   Negative: [1] New-onset diabetes suspected (e.g., frequent urination, weak, weight loss) AND [2] vomiting or rapid breathing   Negative: Vomiting lasts > 4 hours   Negative: Patient sounds very sick or weak to the triager   Negative: Fever > 100.4 F (38.0 C)   Negative: Blood glucose > 400 mg/dL (56.2 mmol/L)   Negative: [1] Blood glucose > 300 mg/dL (13.0 mmol/L) AND [8] two or more times in a row   Negative: Urine ketones moderate - large (or blood ketones > 1.4 mmol/L)   Negative: [1] Symptoms of high blood sugar (e.g., abnormally thirsty, frequent urination, weight loss) AND [2] not able to test blood glucose AND [3] pregnant   Negative: [1] Caller has URGENT medication or insulin pump question AND [2] triager unable to answer question   Negative: [1] Blood glucose > 240 mg/dL (65.7 mmol/L)  AND [2] pregnant   Negative: [1] Symptoms of high blood sugar (e.g., abnormally thirsty, frequent urination, weight loss) AND [2] not able to test blood glucose   Negative: New-onset diabetes suspected (e.g., abnormally thirsty, frequent urination, weight loss)   Negative: [1] Caller has NON-URGENT medication or insulin pump question AND [2] triager unable to answer question   Negative: [1] Blood glucose 240 - 300 mg/dL (16.1 - 09.6 mmol/L) AND [2] uses insulin (e.g., insulin-dependent, all people with type 1 diabetes)   Negative: [1] Blood glucose > 300 mg/dL (04.5 mmol/L) AND [4] does not  use insulin (e.g., not insulin-dependent; most people with type 2  diabetes)   Negative: [1] Blood glucose 240 - 300 mg/dL (09.8 - 11.9 mmol/L) AND [2] does not  use insulin (e.g., not insulin-dependent; most people with type 2 diabetes)   Negative: [1] Blood glucose > 300 mg/dL (14.7 mmol/L) AND [8] uses insulin (e.g., insulin-dependent, all people with type 1 diabetes)    Answer Assessment - Initial Assessment Questions  1. BLOOD GLUCOSE: "What is your blood glucose level?"       184  Noted blood glucose 150s-200 since being hospitalized.    2. ONSET: "When did you check the blood glucose?"      Noted since 3/18    3. USUAL RANGE: "What is your glucose level usually?" (e.g., usual fasting morning value, usual evening value)      pt doesn't have glucometer. Per pt, blood glucose was not as high when it was checked prior to surgery. Betwee 100-120.      4. KETONES: "Do you check for ketones (urine or blood test strips)?" If Yes, ask: "What does the test show now?"       Denies    5. TYPE 1 or 2:  "Do you know what type of diabetes you have?"  (e.g., Type 1, Type 2, Gestational; doesn't know)       DM2    6. INSULIN: "Do you take insulin?" "What type of insulin(s) do you use? What is the mode of delivery? (syringe, pen; injection or pump)?"       Denies    7. DIABETES PILLS: "Do you take any pills for your diabetes?" If Yes, ask: "Have you missed taking any pills recently?"      Metformin    8. OTHER SYMPTOMS: "Do you have any symptoms?" (e.g., fever, frequent urination, difficulty breathing, dizziness, weakness, vomiting)      Fatigue: moderate fatigue x2 weeks. Pt stays in bed all day, gets up for an hour and will go back to sleep and can still sleep throughout the night.   9. PREGNANCY: "Is there any chance you are pregnant?" "When was your last menstrual period?"      Postmenopausal    Since post surgery 3/18    Protocols used: Diabetes - High Blood Sugar-A-AH

## 2022-05-03 ENCOUNTER — Encounter (INDEPENDENT_AMBULATORY_CARE_PROVIDER_SITE_OTHER): Payer: Self-pay | Admitting: Nurse Practitioner

## 2022-05-03 ENCOUNTER — Ambulatory Visit (INDEPENDENT_AMBULATORY_CARE_PROVIDER_SITE_OTHER): Payer: Medicare Other | Admitting: Nurse Practitioner

## 2022-05-03 VITALS — BP 109/69 | HR 86 | Ht 63.0 in | Wt 136.0 lb

## 2022-05-03 DIAGNOSIS — R5383 Other fatigue: Secondary | ICD-10-CM

## 2022-05-03 DIAGNOSIS — E119 Type 2 diabetes mellitus without complications: Secondary | ICD-10-CM

## 2022-05-03 DIAGNOSIS — E559 Vitamin D deficiency, unspecified: Secondary | ICD-10-CM

## 2022-05-03 DIAGNOSIS — Z862 Personal history of diseases of the blood and blood-forming organs and certain disorders involving the immune mechanism: Secondary | ICD-10-CM

## 2022-05-03 DIAGNOSIS — Z932 Ileostomy status: Secondary | ICD-10-CM

## 2022-05-03 MED ORDER — GLUCOSE BLOOD VI STRP
1.0000 | ORAL_STRIP | Freq: Two times a day (BID) | 11 refills | Status: AC
Start: 2022-05-03 — End: ?

## 2022-05-03 MED ORDER — VITAMIN E 400 UNIT OR CAPS: 400.00 [IU] | ORAL_CAPSULE | Freq: Every day | ORAL | Status: AC

## 2022-05-03 MED ORDER — BLOOD GLUCOSE METER KIT
PACK | 0 refills | Status: AC
Start: 2022-05-03 — End: ?

## 2022-05-03 MED ORDER — LANCETS MISC
1.0000 | Freq: Two times a day (BID) | 5 refills | Status: AC
Start: 2022-05-03 — End: ?

## 2022-05-03 MED ORDER — MULTI-VITAMINS PO TABS: 1.00 | ORAL_TABLET | Freq: Every day | ORAL | Status: AC

## 2022-05-03 MED ORDER — GLUCOSE BLOOD VI STRP
1.0000 | ORAL_STRIP | Freq: Two times a day (BID) | 5 refills | Status: DC
Start: 2022-05-03 — End: 2022-05-13

## 2022-05-03 MED ORDER — BIOTIN 1000 MCG OR TABS: 1000.00 ug | ORAL_TABLET | Freq: Every day | ORAL | Status: AC

## 2022-05-03 MED ORDER — LANCETS MISC
1.0000 | Freq: Two times a day (BID) | 11 refills | Status: DC
Start: 2022-05-03 — End: 2022-05-13

## 2022-05-03 NOTE — Addendum Note (Signed)
Encounter addended by: Ladoris Gene on: 05/03/2022 9:07 AM   Actions taken: Flowsheet accepted, Charge Capture section accepted

## 2022-05-03 NOTE — Progress Notes (Addendum)
Tiffany Chen  MRN: [id number]  DOB: Aug 30, 1945  PMD: Delight Ovens Tolentino        Chief Complaint:   Chief Complaint   Patient presents with    Fatigue     Has protein drink- sleeps more - 2 weeks   Glucose is rising all day- 150-220        HPI:  Tiffany Chen is a 77 year old female  with history significant for   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 She is accompanied by her sister.   Elevated blood sugars:  pt is concerned about elevated blood sugars at home, does not have glucometer so has been using her sister's. . Reports while in the hospital was  getting insulin. Is taking metformin 500 mg BID, thinks she  might need more.   Last hgb A1c : 6.3 fasting BS range from 140-225.   Feels severely tired, no energy for 2 weeks.  Is s/p colostomy takedown/creation of diverting loop ileostomy, lysis of adhesions, flexible sigmoidoscopy, intraoperative bowel perfusion assessment on 03/25/22   Healing is good  from surgery. Was very  energetic after surgery, was walking, eating good. But suddenly from 2 weeks ago starting to feel "run down by a truck".sleeps 7-8 hours at night, up for 2 hours and needs to take another nap.   Has Protein drinks daily 1-2 /no sugar orgaine drinks   Cottage cheese, penut butter and crackles  Been staying inside recently.  Hx of anemia, hgb in normal range.  Fatigue is unusual   Feels congested/ cough occasinally but not worse than before, follows up with pulmonology , hx of MAC.   Reports past three years been though, lost her husband, moved back to [place], had health issues but overall doing well. She has support of her sister and sister in law. PHQ 9: 8( last year 36), GAD 7 4( last year 9). Was referred to psych,says had a bad experience and does want referral.    PCP is   Health Maintenance Topics with due status: Overdue       Topic Date Due    Colorectal Cancer Screening Never done    Tetanus Never done    Shingles Vaccine Never done    COVID-19 Vaccine 12/04/2021    Diabetic Foot Exam 04/26/2022     Health Maintenance Topics with due status: Due Soon       Topic Date Due    LDL Monitoring 05/17/2022     Health Maintenance Topics with due status: Not Due  Topic Last Completion Date    Osteoporosis Screening 05/18/2021    Diabetic Retinal Exam 06/11/2021    Advance Care Planning 12/18/2021    Medicare Annual Wellness Visit 12/18/2021    Diabetes Urine Microalbumin 12/25/2021    Diabetes A1c 03/28/2022    Diabetes CMP 04/26/2022    PHQ9 Depression Monitoring doc flowsheet 05/03/2022     Health Maintenance Topics with due status: Completed       Topic Last Completion Date    Pneumococcal Vaccine 09/22/2020    Hepatitis C Screening 05/16/2021    Influenza 10/09/2021     Health Maintenance Topics with due status: Discontinued       Topic Date Due    Breast Cancer Screen Discontinued    Lipid Screening Discontinued      Past Surgical History:   Procedure Laterality Date    BRONCHOSCOPY  12/2020    CESAREAN  SECTION, LOW TRANSVERSE      COLOSTOMY      CRANIOTOMY      LUNG LOBECTOMY       Social History     Socioeconomic History    Marital status: Widowed     Spouse name: Not on file    Number of children: Not on file    Years of education: Not on file    Highest education level: Not on file   Occupational History    Not on file   Tobacco Use    Smoking status: Never    Smokeless tobacco: Never   Vaping Use    Vaping status: Never Used   Substance and Sexual Activity    Alcohol use: Yes     Comment: glass of wine every other month    Drug use: Not Currently    Sexual activity: Not Currently   Other Topics Concern    Military Service No    Blood Transfusions No    Caffeine Concern No    Occupational Exposure No    Hobby Hazards Yes     Comment: Gardening, MAC    Sleep Concern No    Stress Concern Yes     Comment: Husband death Cov39, June 19, 2018, sold home, rehomed pets. [place]    Weight Concern No    Special Diet Yes    Back Care No    Exercises Regularly Yes    Bike Helmet Use No    Seat Belt Use No    Performs Self-Exams No   Social History Narrative    Lives with brother Fayrene Fearing at home. Sister also lives in [facility]     Social Determinants of Health     Financial Resource Strain: Not on file   Food Insecurity: No Food Insecurity (03/25/2022)    Hunger Vital Sign     Worried About Running Out of Food in the Last Year: Never true     Ran Out of Food in the Last Year: Never true   Transportation Needs: No Transportation Needs (03/25/2022)    PRAPARE - Therapist, art (Medical): No     Lack of Transportation (Non-Medical): No   Physical Activity: Not on file (12/17/2021)   Stress: Not on file   Social Connections: Not on file   Intimate Partner Violence: Low Risk  (03/25/2022)    [place] IPV     Have you ever been emotionally or physically abused by your partner or someone important to you?: No     Within the last year have you  been hit, slapped, kicked or otherwise physically hurt by someone?: No     Since  you've been pregnant, have you been slapped, kicked or otherwise physically hurt by someone?: Not on file     Within the last year has anyone forced you to have sexual activities?: No     Are you afraid of your spouse or partner you listed above?: No   Housing Stability: Unknown (03/25/2022)    Housing Stability Vital Sign     Unable to Pay for Housing in the Last Year: No     Number of Places Lived in the Last Year: Not on file     Unstable Housing in the Last Year: No     Family History   Problem Relation Name Age of Onset    Cancer Mother Denton Lank ngs and throat    Heart Disease Mother Joya Martyr         Congrestive heart failure due to cancer    Hypertension Mother Joya Martyr     Psychiatry Mother Joya Martyr         Bi polar, manic depressive    Cancer Father Jamal Maes         Prostate    Diabetes Father Jamal Maes     Stroke Father Jamal Maes     Stroke M Grandmother Winifred Larita Fife     Cancer Brother Vallarie Mare         Skin (many locations)    Diabetes Brother Vallarie Mare      Current medications:  Current Outpatient Medications   Medication Sig    ARIKAYCE 590 MG/8.4ML SUSP Inhale the contents of one vial (590mg ) via Lamira device 1 time daily as directed.    atorvastatin (LIPITOR) 10 MG tablet Take 1 tablet (10 mg) by mouth daily.    azithromycin (ZITHROMAX) 250 MG tablet TAKE 1 TABLET BY MOUTH  DAILY    biotin 1000 MCG tablet Take 1 tablet (1,000 mcg) by mouth daily.    blood glucose meter PHARMACY: Please dispense insurance approved bluetooth enable meter.    CLOFAZIMINE IRB [id number] 50 MG CAPSULE Take 2 capsules (100mg ) by mouth once daily with food as directed.    ethambutol (MYAMBUTOL) 400 MG tablet TAKE 2 TABLETS BY MOUTH  DAILY    ferrous sulfate 325 (65 Fe) MG tablet Take 1 tablet (325 mg) by mouth daily.    fluoxetine (PROZAC) 40 MG capsule Take 1 capsule (40 mg) by mouth daily.    glucose blood test strip 1 strip by Other route 2 times daily (before meals).    glucose blood test strip 1  strip by Other route 2 times daily (before meals).    lancets 1 each by Other route 2 times daily (before meals).    lancets 1 each by Other route 2 times daily (before meals).    metFORMIN (GLUCOPHAGE) 500 mg tablet Take 1 tablet (500 mg) by mouth 2 times daily (with meals).    Multiple Vitamin (MULTIVITAMIN) TABS tablet Take 1 tablet by mouth daily.    omeprazole (PRILOSEC) 20 MG capsule TAKE 1 CAPSULE BY MOUTH IN  THE MORNING BEFORE  BREAKFAST    psyllium (METAMUCIL) WAFR Take 2 Wafers by mouth every 12 hours as needed (As needed to maintain ileostomy output <1.2L).    ramipril (ALTACE) 2.5 MG capsule Take 1 capsule (2.5 mg) by mouth daily.    rifAMPin (RIFADIN) 300 MG capsule TAKE  2 CAPSULES BY MOUTH  DAILY    sodium chloride 7 % NEBU USE 1 VIAL VIA NEBULIZER  EVERY 12 HOURS    umeclidinium-vilanterol (ANORO ELLIPTA) 62.5-25 MCG/ACT inhaler Inhale 1 puff by mouth daily.    VENTOLIN HFA 108 (90 Base) MCG/ACT inhaler USE 2 INHALATIONS BY MOUTH  EVERY 6 HOURS AS NEEDED FOR WHEEZING    vitamin D3 125 MCG (5000 UT) capsule Take 1 capsule (5,000 Units) by mouth every 24 hours.    vitamin E 400 UNIT capsule Take 1 capsule (400 Units) by mouth daily.     Current Facility-Administered Medications   Medication    lidocaine (XYLOCAINE) 2 % viscous solution 1 mL    lidocaine (XYLOCAINE) 4 % topical       Review of Systems   Constitutional:  Positive for fatigue. Negative for fever.   HENT:  Negative for congestion, ear pain, rhinorrhea, sinus pain and sore throat.    Eyes:  Negative for pain and discharge.   Respiratory:  Negative for cough and shortness of breath.    Cardiovascular:  Negative for chest pain and palpitations.   Gastrointestinal:  Negative for abdominal pain, constipation and diarrhea.   Endocrine: Negative for cold intolerance and polyuria.   Genitourinary:  Negative for difficulty urinating and frequency.   Musculoskeletal:  Negative for arthralgias and back pain.   Skin:  Negative for rash and wound.    Neurological:  Negative for dizziness and numbness.   Hematological:  Negative for adenopathy. Does not bruise/bleed easily.   Psychiatric/Behavioral:  Negative for sleep disturbance. The patient is not nervous/anxious.         Physical Examination:    05/03/22  1011   BP: 109/69   Pulse: 86   SpO2: 95%     Physical Exam  Vitals reviewed.   Constitutional:       Appearance: Normal appearance. She is not ill-appearing.   HENT:      Head: Normocephalic and atraumatic.      Right Ear: External ear normal.      Left Ear: External ear normal.      Nose: Nose normal. No congestion or rhinorrhea.      Mouth/Throat:      Mouth: Mucous membranes are moist.      Pharynx: Oropharynx is clear.   Eyes:      General: No scleral icterus.     Conjunctiva/sclera: Conjunctivae normal.      Pupils: Pupils are equal, round, and reactive to light.   Cardiovascular:      Rate and Rhythm: Normal rate and regular rhythm.      Pulses: Normal pulses.      Heart sounds: Normal heart sounds. No murmur heard.     Comments: No orthostatic blood pressure drop   BP standing 118/74  Pulmonary:      Effort: Pulmonary effort is normal. No respiratory distress.      Breath sounds: Normal breath sounds. No wheezing or rhonchi.   Abdominal:      General: Bowel sounds are normal. There is no distension.      Palpations: Abdomen is soft.      Tenderness: There is no abdominal tenderness. There is no right CVA tenderness or left CVA tenderness.      Comments: Ileostomy right abdomen.  Ileostomy  scar left abdomen mid abdominal scar      Musculoskeletal:         General: No swelling or tenderness. Normal range of motion.  Cervical back: Normal range of motion and neck supple.      Right lower leg: No edema.      Left lower leg: No edema.   Skin:     General: Skin is warm and dry.      Capillary Refill: Capillary refill takes less than 2 seconds.      Findings: No erythema, lesion or rash.   Neurological:      General: No focal deficit present.       Mental Status: She is alert and oriented to person, place, and time. Mental status is at baseline.      Gait: Gait normal.      Deep Tendon Reflexes: Reflexes normal.   Psychiatric:         Mood and Affect: Mood normal.         Behavior: Behavior normal.         Thought Content: Thought content normal.            Diagnostics (if performed):        Assessment/Plan:         ICD-10-CM ICD-9-CM    1. Type 2 diabetes mellitus without complication, without long-term current use of insulin (CMS-HCC)  E11.9 250.00 Glycosylated Hgb(A1C), Blood Lavender      blood glucose meter      glucose blood test strip      lancets      glucose blood test strip      lancets      Glucose, Blood Green Plasma Separator Tube      2. Fatigue, unspecified type  R53.83 780.79 Vitamin B12, Blood Green Plasma Separator Tube      Urinalysis with Culture Reflex, when indicated      TSH, Blood Green Plasma Separator Tube      Free Thyroxine, Blood Green Plasma Separator Tube      3. Vitamin D deficiency  E55.9 268.9       4. 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 History of anemia  Z86.2 V12.3 Iron, Blood Green Plasma Separator Tube      IBC - Iron Binding Capacity Green Plasma Separator Tube        1# pt has DM type 2,  currently taking metformin 500 mg bid.   Pt reports elevated blood sugars at home.   Recommend checking blood sugar at home at least once a day before breakfast, and before dinner and more often if high BP trend continues.   For that reason pt needs to have a glucometer at home and check BP regularly.   Will prescribe glucometer/ strips.  Repeat Hgb A1c/ fasting blood glucose level.  Depend on result of labs, will adjust medications.  Follow a diabetic diet.    2#  Pt reports ongoing fatigue.  Complete additional blood test.  Remain hydrated/ rest as needed.  Likely related to recent surgery    3# check levels regularly    4#   Ileostomy is active for soft stool.  Follows up with GS.     5#   Complete iron profile        See patient d/c instructions provided  Return/ED precautions reviewed with pt - follow up PRN sxs persist/worsen/new sxs develop  Patient (or responsible party) verbalizes understanding of plan  F/u with pcp for routine care      Supervising MD for clinical site where visit took place today:  Dr. Sigurd Sos

## 2022-05-06 ENCOUNTER — Encounter (INDEPENDENT_AMBULATORY_CARE_PROVIDER_SITE_OTHER): Payer: Self-pay

## 2022-05-06 ENCOUNTER — Other Ambulatory Visit: Payer: Medicare Other | Attending: Nurse Practitioner

## 2022-05-06 DIAGNOSIS — Z862 Personal history of diseases of the blood and blood-forming organs and certain disorders involving the immune mechanism: Secondary | ICD-10-CM

## 2022-05-06 DIAGNOSIS — Z0000 Encounter for general adult medical examination without abnormal findings: Secondary | ICD-10-CM

## 2022-05-06 DIAGNOSIS — E119 Type 2 diabetes mellitus without complications: Secondary | ICD-10-CM

## 2022-05-06 DIAGNOSIS — R5383 Other fatigue: Secondary | ICD-10-CM | POA: Insufficient documentation

## 2022-05-06 LAB — IBC - IRON BINDING CAPACITY
Iron Saturation: 19 %
Iron: 78 ug/dL (ref 37–145)
Total IBC: 421 ug/dL (ref 148–506)
UIBC: 343 ug/dL (ref 112–346)

## 2022-05-06 LAB — LIPID(CHOL FRACT) PANEL, BLOOD
Cholesterol: 171 mg/dL (ref <200)
HDL-Cholesterol: 61 mg/dL
LDL-Chol (Calc): 85 mg/dL (ref <160)
Non-HDL Cholesterol: 110 mg/dL
Triglycerides: 124 mg/dL (ref 10–170)

## 2022-05-06 LAB — FREE THYROXINE, BLOOD: Free T4: 1.07 ng/dL (ref 0.93–1.70)

## 2022-05-06 LAB — TSH, BLOOD: TSH: 4.7 u[IU]/mL — ABNORMAL HIGH (ref 0.27–4.20)

## 2022-05-06 LAB — GLUCOSE, BLOOD: Glucose: 155 mg/dL — ABNORMAL HIGH (ref 70–99)

## 2022-05-06 LAB — GLYCOSYLATED HGB(A1C), BLOOD: Glyco Hgb (A1C): 6.7 % — ABNORMAL HIGH (ref 4.8–5.8)

## 2022-05-07 ENCOUNTER — Telehealth (INDEPENDENT_AMBULATORY_CARE_PROVIDER_SITE_OTHER): Payer: Self-pay

## 2022-05-07 NOTE — Telephone Encounter (Signed)
Fax printed and placed in Resident's in box.

## 2022-05-07 NOTE — Telephone Encounter (Signed)
Prescribed by NP. Placed in inbox (tower of power) to sign.

## 2022-05-07 NOTE — Telephone Encounter (Signed)
GENERAL INQUIRY:    Caller Information:  Who is calling? Ancillary: rachel from [employer] is calling on behalf of patient.  Best way to contact: [phone]  What is the reason for the call? Fleet Contras will be sending a request for ostomy supplies and she would like to know who is Dr. Sharlot Gowda attending doctor.  Encounter Details:  Is this a duplicate encounter?: No previous documentation found on this issue.  Action required by the office: Please contact caller  Patient Information:  Is MyChart active?: Yes.  Verification:  Has the inquiry been read verbatim to the caller and verbalizes satisfaction and confirms the above is accurate?: Yes  Caller has been advised this message will be transmitted to the office and can expect a response within the next 24-72 hours during working business days.  Encounter created by Vladimir Crofts from the Care Assist Team.  Signature generated from controlled access password on May 07, 2022 at 10:26 AM.

## 2022-05-07 NOTE — Telephone Encounter (Signed)
Fax Received    Date Received: 05/03/22    Time: 1:27:30 PM    Date Processed: 05/07/2022    From: [facility]    Document Type:  Medicare new prescription order for DM testing supplies- one touch [employer]   received @ 1:27:30 PM; located in physician's "Print Folder" folder ending in 1:27:30 PM. Please send to "DONE FOLDER" after printing.      Outside fax, please review.  Thank you.

## 2022-05-07 NOTE — Telephone Encounter (Signed)
From: [name]  To: Monia Sabal  Sent: 05/06/2022 7:42 PM PDT  Subject: Diabetic monitor and supplies    ON Friday April 26th visited with Tiffany Chen about problems and fatigue. Prior nurses doing follow up fro surgery of March 18th requested a diabetic monitor without follow up with me or them. THEN MS RAJAEI ORDERED MONTOR BY PRESCRIPTION WITH [facility] [address]. The. Pharmacy said that they have sent request for additional info since prescription received. I have been to pharmacy 6 times to pickup monitor over the two weeks. As of this date cannot pickup. As my sugars are running high and I am so fatigued request your action.

## 2022-05-08 ENCOUNTER — Telehealth (INDEPENDENT_AMBULATORY_CARE_PROVIDER_SITE_OTHER): Payer: Self-pay | Admitting: Nurse Practitioner

## 2022-05-08 DIAGNOSIS — R7989 Other specified abnormal findings of blood chemistry: Secondary | ICD-10-CM

## 2022-05-08 DIAGNOSIS — R5383 Other fatigue: Secondary | ICD-10-CM

## 2022-05-08 DIAGNOSIS — E119 Type 2 diabetes mellitus without complications: Secondary | ICD-10-CM

## 2022-05-08 NOTE — Telephone Encounter (Signed)
Niloufar signed the form and I Faxed it to [phone].   Johnryan Sao ma

## 2022-05-08 NOTE — Telephone Encounter (Signed)
Called to [facility] pharmacy: verified Medicare part B forms are what is needed to be filled out for meter , lancets and test strips.     I see a note from 05/08/2022 that form was sent to pharmacy. Pharmacy states they have not received anything.     Routing to office for assistance as [facility] is unable to fill out these DWO( dispense written order )  forms for medicare part B.     Thank you     Chantel Raechel Chute ( Prescription Refill and Prior Authorization [facility])

## 2022-05-09 NOTE — Telephone Encounter (Signed)
Form faxed.  Kynadi Dragos MA

## 2022-05-10 NOTE — Telephone Encounter (Signed)
Received another fax from [facility] stating additional information is needed (pls see below). Per pharmacy staff, there needs to be documentation that pt has been seen for this condition in the last 6 months and what progress has she made. Can you do an addendum on pt's visit note?

## 2022-05-10 NOTE — Telephone Encounter (Addendum)
Called pharmacy and stated they received form for lancets and test strips but cannot find the rx for the glucose meter. Pharmacy faxed over form. Signed by NP Roque Cash and faxed to [facility]. Received confirmation that it was received.     2nd time faxing forms

## 2022-05-13 ENCOUNTER — Telehealth (HOSPITAL_BASED_OUTPATIENT_CLINIC_OR_DEPARTMENT_OTHER): Payer: Self-pay | Admitting: Critical Care Medicine

## 2022-05-13 ENCOUNTER — Telehealth (INDEPENDENT_AMBULATORY_CARE_PROVIDER_SITE_OTHER): Payer: Self-pay

## 2022-05-13 MED ORDER — LANCETS MISC
1.0000 | Freq: Every day | 2 refills | Status: AC
Start: 2022-05-13 — End: ?

## 2022-05-13 MED ORDER — GLUCOSE BLOOD VI STRP
1.0000 | ORAL_STRIP | Freq: Every day | 2 refills | Status: AC
Start: 2022-05-13 — End: ?

## 2022-05-13 NOTE — Telephone Encounter (Signed)
Fax Received    Date Received: 05/09/22    Time: 11:23:15 AM    Date Processed: 05/13/2022    From:  [facility]     Document Type:  Prescription for medical supplies  received @ 11:23:15 AM; located in physician's "Print Folder" folder ending in 52. Please send to "DONE FOLDER" after printing.      Outside fax, please review.  Thank you.

## 2022-05-13 NOTE — Telephone Encounter (Signed)
Please call pt's pharmacy to clarify .    Thank you,  Tiger Spieker,Nurse practitioner

## 2022-05-13 NOTE — Telephone Encounter (Signed)
MACRN: pt will be off mAC antibx tx June 1st

## 2022-05-13 NOTE — Telephone Encounter (Signed)
Pls see encounter from 4/30. Needs to be sign by Dr. Sharlot Gowda mentor. Placed in Dr. Evelina Dun box.

## 2022-05-13 NOTE — Telephone Encounter (Signed)
Addendum done by NP Rajaei, faxed notes to [facility]. Received confirmation

## 2022-05-13 NOTE — Telephone Encounter (Signed)
Forms printed and placed in MD's in-box in the tower of power.

## 2022-05-13 NOTE — Telephone Encounter (Signed)
Pls see previous documentation

## 2022-05-13 NOTE — Telephone Encounter (Signed)
Patient is not on insulin, Medicare only allows once a day testing.  2 times will not be covered.

## 2022-05-13 NOTE — Telephone Encounter (Signed)
Britney from [employer] is calling in to confirm pt is on remission for Airkayce and wont be needing refills.     CB#[phone] ext [phone]    Please assist  Thank you

## 2022-05-14 NOTE — Telephone Encounter (Signed)
Signed. Returned to box.

## 2022-05-14 NOTE — Telephone Encounter (Signed)
Who is calling: Ancillary: Fleet Contras from [employer] is calling on behalf of patient  Insurance Coverage Verified: Active- in network  Reason for this call:   Calling to check status of the forms.     Action required by office: Please contact caller     Duplicate encounter? No previous documentation found on this issue.     Best way to contact: [phone]    Inquiry has been read verbatim to this caller. Verbalizes satisfaction and confirms the above is accurate: yes      Has been advised this message will be transmitted to office and can expect a response within the next 24-72 hours.

## 2022-05-14 NOTE — Telephone Encounter (Signed)
MACRN: called [employer] rep-left detailed VM pt will complete on june 1st

## 2022-05-15 ENCOUNTER — Encounter (INDEPENDENT_AMBULATORY_CARE_PROVIDER_SITE_OTHER): Payer: Self-pay

## 2022-05-15 NOTE — Telephone Encounter (Signed)
Fax signed and sent.  Ok to close.  Noela Brothers, MA2

## 2022-05-15 NOTE — Telephone Encounter (Addendum)
Form was placed in Dr. Evelina Dun box. Pls see encounter from 5/6. Form has been faxed 5/8

## 2022-05-16 NOTE — Telephone Encounter (Signed)
Any thing else required from me??  Thx,  Roque Cash, Nurse Practitioner

## 2022-05-20 ENCOUNTER — Ambulatory Visit (INDEPENDENT_AMBULATORY_CARE_PROVIDER_SITE_OTHER): Payer: Medicare Other | Admitting: Ophthalmology

## 2022-05-20 ENCOUNTER — Ambulatory Visit
Admission: RE | Admit: 2022-05-20 | Discharge: 2022-05-20 | Disposition: A | Discharge status: Home / Self Care | Payer: Medicare Other | Attending: Critical Care Medicine | Admitting: Critical Care Medicine

## 2022-05-20 DIAGNOSIS — J479 Bronchiectasis, uncomplicated: Secondary | ICD-10-CM | POA: Insufficient documentation

## 2022-05-20 DIAGNOSIS — J984 Other disorders of lung: Secondary | ICD-10-CM | POA: Insufficient documentation

## 2022-05-20 DIAGNOSIS — R591 Generalized enlarged lymph nodes: Secondary | ICD-10-CM

## 2022-05-20 DIAGNOSIS — Z961 Presence of intraocular lens: Secondary | ICD-10-CM

## 2022-05-20 DIAGNOSIS — H43813 Vitreous degeneration, bilateral: Secondary | ICD-10-CM

## 2022-05-20 DIAGNOSIS — A310 Pulmonary mycobacterial infection: Secondary | ICD-10-CM | POA: Insufficient documentation

## 2022-05-20 NOTE — Progress Notes (Signed)
Pseudophakia of both eyes  Assessment & Plan:  Doing well post CEIOL OU 11/2021.  Seeing well at distance without glasses.  Comfortable with +2.00 readers.  FU 6-9 months, dilate.    Last dilation 01/2022.  Last HgbA1C 6.7 05/06/22      Posterior vitreous detachment of both eyes  Assessment & Plan:  No flashing lights OU.  Continue monitoring RD symptoms.  Last dilation 01/2022.          Return in about 6 months (around 11/20/2022).

## 2022-05-20 NOTE — Assessment & Plan Note (Signed)
No flashing lights OU.  Continue monitoring RD symptoms.  Last dilation 01/2022.

## 2022-05-20 NOTE — Assessment & Plan Note (Addendum)
Doing well post CEIOL OU 11/2021.  Seeing well at distance without glasses.  Comfortable with +2.00 readers.  FU 6-9 months, dilate.    Last dilation 01/2022.  Last HgbA1C 6.7 05/06/22

## 2022-05-24 NOTE — Progress Notes (Signed)
AUDIOMETRIC ASSESSMENT    History:  Tiffany Chen, a 77 year old female, was referred by Dr. Geraldine Contras, [facility].   Patient's most recent audiogram was performed on 02/28/2022 at [place].    Otoscopy:  Otoscopy revealed clear external auditory canals and good tympanic membrane visualization bilaterally.     Subjective Results:  Pulsed tone testing utilizing insert ear phones indicated bilateral  thresholds WNLs [phone], with sloping mild to moderate sensorineural loss thresholds at 2000 Hz and above. Speech audiometry revealed very good word recognition of phonetically balanced English words (96%) bilaterally. Speech reception thresholds (SRTs) were consistent with pure tone averages (PTAs), indicating good reliability.    Impression:  Comparison with test results obtained at this clinic 02/28/2022  indicated stable air conduction thresholds bilaterally across the frequency range.Speech audiometry revealed stable word recognition of phonetically balanced English word ability bilaterally.    Test results were discussed with the patient, and patient questions were answered.    Plan:  * Patient is scheduled with Audiology for additional testing 12/2022 and 06/2023.  * Patient was given a Xerox copy test results.  * Referring physician contacted via EPIC Staff Message to confirm above testing intervals.  * Patient was advised that she is considered a very borderline candidate for trial use of amplification.  She will consider.  * EPIC Authorization/DME Request submitted today for amplification. Patient to be contacted by [place] Authorization team with outcome of that investigation.  This was done as a courtesy for patient for when/if she is ready to consider amplification.          Audiogram viewable in EPIC "Media" tab.    Jacklyn Shell  Senior Audiologist  [facility]  Otolaryngology / Head & Neck Surgery and Audiology

## 2022-05-27 ENCOUNTER — Encounter (INDEPENDENT_AMBULATORY_CARE_PROVIDER_SITE_OTHER): Payer: Self-pay | Admitting: Allergy

## 2022-05-27 ENCOUNTER — Ambulatory Visit
Admission: RE | Admit: 2022-05-27 | Discharge: 2022-05-27 | Disposition: A | Discharge status: Home / Self Care | Payer: Medicare Other | Attending: Allergy | Admitting: Allergy

## 2022-05-27 VITALS — BP 105/61 | HR 70 | Temp 97.9°F | Resp 16 | Wt 141.8 lb

## 2022-05-27 DIAGNOSIS — D809 Immunodeficiency with predominantly antibody defects, unspecified: Secondary | ICD-10-CM | POA: Insufficient documentation

## 2022-05-27 LAB — IGG, BLOOD: IGG: 939 mg/dL (ref 700–1600)

## 2022-05-27 MED ORDER — DEXTROSE 5 % IV SOLN
INTRAVENOUS | Status: DC
Start: 2022-05-27 — End: 2022-05-27

## 2022-05-27 MED ORDER — CETIRIZINE HCL 10 MG OR TABS
10.0000 mg | ORAL_TABLET | Freq: Once | ORAL | Status: AC
Start: 2022-05-27 — End: 2022-05-27
  Administered 2022-05-27: 10 mg via ORAL
  Filled 2022-05-27: qty 1

## 2022-05-27 MED ORDER — ACETAMINOPHEN 325 MG PO TABS
650.0000 mg | ORAL_TABLET | Freq: Once | ORAL | Status: AC
Start: 2022-05-27 — End: 2022-05-27
  Administered 2022-05-27: 650 mg via ORAL
  Filled 2022-05-27: qty 2

## 2022-05-27 MED ORDER — IMMUNE GLOBULIN (HUMAN) 20 GM/200ML IJ SOLN
0.5000 g/kg | Freq: Once | INTRAMUSCULAR | Status: AC
Start: 2022-05-27 — End: 2022-05-27
  Administered 2022-05-27: 25 g via INTRAVENOUS
  Filled 2022-05-27: qty 200

## 2022-05-27 MED ORDER — DIPHENHYDRAMINE HCL 25 MG OR TABS OR CAPS CUSTOM
25.0000 mg | ORAL_CAPSULE | Freq: Once | ORAL | Status: DC | PRN
Start: 2022-05-27 — End: 2022-05-27

## 2022-05-27 NOTE — Interdisciplinary (Signed)
Nursing Note - [facility]    Tiffany Chen is a 77 year old female who presents for chemotherapy cycle 3, day(s) 141 of IVIG..      Pre-treatment nursing assessment:  No problems identified upon assessment.        Lab Results   Component Value Date    ABSNEUTRO 3.2 04/26/2022    IANC 3.2 04/26/2022    WBC 5.5 04/26/2022    RBC 4.02 04/26/2022    HGB 12.7 04/26/2022    HCT 37.3 04/26/2022    MCV 92.8 04/26/2022    MCHC 34.0 04/26/2022    RDW 12.9 04/26/2022    PLT 252 04/26/2022       Lab Results   Component Value Date    NA 136 04/26/2022    K 4.3 04/26/2022    CL 100 04/26/2022    BICARB 25 04/26/2022    BUN 18 04/26/2022    CREAT 0.84 04/26/2022    GLU 155 (H) 05/06/2022    [place] 10.0 04/26/2022           Lab Results   Component Value Date    AST 22 04/26/2022    ALT 21 04/26/2022    ALK 78 04/26/2022    TP 6.6 04/26/2022    ALB 4.3 04/26/2022    TBILI 0.43 04/26/2022    DBILI <0.2 07/17/2020        Vitals:    05/27/22 0752 05/27/22 0813 05/27/22 1044   BP:  (!) 108/55 105/61   BP Location:  Left arm Left arm   BP Patient Position:  Semi-Fowlers Semi-Fowlers   Pulse:  71 70   Resp:  16 16   Temp:  98 F (36.7 C) 97.9 F (36.6 C)   TempSrc:   Temporal   SpO2:  96% 98%   Weight: 64.3 kg (141 lb 12.1 oz)           Medications   dextrose 5% infusion (0 mL IntraVENOUS Stopped 05/27/22 1044)   diphenhydrAMINE (BENADRYL) tablet or capsule 25 mg (25 mg Oral Not given 05/27/22 0700)   acetaminophen (TYLENOL) tablet 650 mg (650 mg Oral Given 05/27/22 0800)   cetirizine (ZYRTEC) tablet 10 mg (10 mg Oral Given 05/27/22 0800)   immune globulin (human) (IgG) 25 g in 250 mL (GAMUNEX-C) infusion (0 g IntraVENOUS Completed 05/27/22 1043)          [name] tolerated treatment well.    Post blood return: Brisk  IV access post infusion: NS      Patient Education  Learner: Patient  Barriers to learning: No Barriers  Readiness to learn: Acceptance  Method: Explanation    Treatment Education:       Signs and symptoms  of infection, bleeding, adverse reaction(s), symptom control, and when to notify MD.    Patient instructed on post-treatment care and precautions specific to drug(s) administered.   If applicable, education given on vesicant administration risk and signs and symptoms of extravasation (redness, pain, swelling, pressure at IV site) to report immediately to the chemotherapy RN.    Fall Prevention Education: Instructed patient to call for assistance, call light within reach.   Pain Education: Patient instructed to contact nurse if pain should develop or if their current pain therapy becomes ineffective.       Treatment education provided to patient: yes    Response: Verbalizes understanding    Discharge Plan  Discharge instructions given to patient.  Future appointments:request submitted.  Discharge Mode: Ambulatory  Discharge Time: 1046    Accompanied by: Self  Discharged To: Home

## 2022-05-28 ENCOUNTER — Ambulatory Visit
Admission: RE | Admit: 2022-05-28 | Discharge: 2022-05-28 | Disposition: A | Discharge status: Home / Self Care | Payer: Medicare Other | Attending: Critical Care Medicine | Admitting: Critical Care Medicine

## 2022-05-28 DIAGNOSIS — J984 Other disorders of lung: Secondary | ICD-10-CM

## 2022-05-28 DIAGNOSIS — J479 Bronchiectasis, uncomplicated: Secondary | ICD-10-CM | POA: Insufficient documentation

## 2022-05-28 DIAGNOSIS — A310 Pulmonary mycobacterial infection: Secondary | ICD-10-CM

## 2022-05-28 NOTE — Interdisciplinary (Signed)
PFT Lab: unable to collect sample. 10% hypertonic saline given for sputum induction.

## 2022-05-29 ENCOUNTER — Ambulatory Visit
Admission: RE | Admit: 2022-05-29 | Discharge: 2022-05-29 | Disposition: A | Discharge status: Home / Self Care | Payer: Medicare Other | Attending: Critical Care Medicine | Admitting: Critical Care Medicine

## 2022-05-29 DIAGNOSIS — J984 Other disorders of lung: Secondary | ICD-10-CM | POA: Insufficient documentation

## 2022-05-29 DIAGNOSIS — A310 Pulmonary mycobacterial infection: Secondary | ICD-10-CM | POA: Insufficient documentation

## 2022-05-29 DIAGNOSIS — J479 Bronchiectasis, uncomplicated: Secondary | ICD-10-CM | POA: Insufficient documentation

## 2022-05-29 NOTE — Interdisciplinary (Signed)
PFT lab note:   Patient was administered 10% sodium chloride for sputum induction.    Patient was unable to produce a sample.  This is visit 2/3.

## 2022-05-30 ENCOUNTER — Ambulatory Visit (HOSPITAL_COMMUNITY): Payer: Medicare Other

## 2022-05-30 ENCOUNTER — Ambulatory Visit (INDEPENDENT_AMBULATORY_CARE_PROVIDER_SITE_OTHER): Payer: Medicare Other | Admitting: Audiology

## 2022-05-30 DIAGNOSIS — H903 Sensorineural hearing loss, bilateral: Secondary | ICD-10-CM

## 2022-05-30 NOTE — Addendum Note (Signed)
Encounter addended by: Ladoris Gene on: 05/30/2022 5:05 PM   Actions taken: Flowsheet accepted, Charge Capture section accepted

## 2022-06-06 ENCOUNTER — Other Ambulatory Visit: Payer: Medicare Other | Attending: Critical Care Medicine

## 2022-06-06 DIAGNOSIS — A319 Mycobacterial infection, unspecified: Secondary | ICD-10-CM

## 2022-06-06 DIAGNOSIS — E119 Type 2 diabetes mellitus without complications: Secondary | ICD-10-CM | POA: Insufficient documentation

## 2022-06-06 DIAGNOSIS — R5383 Other fatigue: Secondary | ICD-10-CM | POA: Insufficient documentation

## 2022-06-06 LAB — COMPREHENSIVE METABOLIC PANEL, BLOOD
ALT (SGPT): 22 U/L (ref 0–33)
AST (SGOT): 21 U/L (ref 0–32)
Albumin: 4.2 g/dL (ref 3.5–5.2)
Alkaline Phos: 59 U/L (ref 40–130)
Anion Gap: 10 mmol/L (ref 7–15)
BUN: 14 mg/dL (ref 8–23)
Bicarbonate: 24 mmol/L (ref 22–29)
Bilirubin, Tot: 0.46 mg/dL (ref <1.2)
Calcium: 9.9 mg/dL (ref 8.5–10.6)
Chloride: 106 mmol/L (ref 98–107)
Creatinine: 0.77 mg/dL (ref 0.51–0.95)
Glucose: 141 mg/dL — ABNORMAL HIGH (ref 70–99)
Potassium: 4.2 mmol/L (ref 3.5–5.1)
Sodium: 140 mmol/L (ref 136–145)
Total Protein: 6.7 g/dL (ref 6.0–8.0)
eGFR Based on CKD-EPI 2021 Equation: >60 mL/min/{1.73_m2}

## 2022-06-06 LAB — CBC WITH DIFF, BLOOD
ANC-Automated: 2.4 10*3/uL (ref 1.6–7.0)
Abs Basophils: 0 10*3/uL (ref <0.2)
Abs Eosinophils: 0.1 10*3/uL (ref 0.0–0.5)
Abs Lymphs: 0.8 10*3/uL (ref 0.8–3.1)
Abs Monos: 0.5 10*3/uL (ref 0.2–0.8)
Basophils: 0.8 %
Eosinophils: 3.4 %
Hct: 36 % (ref 34.0–45.0)
Hgb: 12.3 gm/dL (ref 11.2–15.7)
Imm Gran %: 0.3 % (ref <1)
Lymphocytes: 21.7 %
MCH: 31.3 pg (ref 26.0–32.0)
MCHC: 34.2 g/dL (ref 32.0–36.0)
MCV: 91.6 um3 (ref 79.0–95.0)
MPV: 10.9 fL (ref 9.4–12.4)
Monocytes: 12.1 %
Plt Count: 207 10*3/uL (ref 140–370)
RBC: 3.93 10*6/uL (ref 3.90–5.20)
RDW: 13.4 % (ref 12.0–14.0)
Segs: 61.7 %
WBC: 3.9 10*3/uL — ABNORMAL LOW (ref 4.0–10.0)

## 2022-06-06 LAB — URINALYSIS WITH CULTURE REFLEX, WHEN INDICATED
Bilirubin: NEGATIVE
Blood: NEGATIVE
Glucose: NEGATIVE
Ketones: NEGATIVE
Leuk Esterase: NEGATIVE Leu/uL
Nitrite: NEGATIVE
Specific Gravity: 1.023 (ref 1.002–1.030)
Urobilinogen: NEGATIVE
pH: 5.5 (ref 5.0–8.0)

## 2022-06-07 ENCOUNTER — Telehealth (INDEPENDENT_AMBULATORY_CARE_PROVIDER_SITE_OTHER): Payer: Self-pay

## 2022-06-07 NOTE — Telephone Encounter (Signed)
[facility] Encounter Note    Previous Telephone/MyChart encounter created for same issue?  No    Please describe the action taken by yourself in detail:  Pt called to book a wellness PD appt for 6/10 at 8am. I spoke w the wellness dep and they said that they've been having trouble w Medicare paying for the appts unless the pt is seen by a LCSW. They accepted transfer to 1212 to further assist.     This is just documentation in the patient chart, no further action is required.    My phone extension, if further information is needed: [phone]    Eben Burow

## 2022-06-13 ENCOUNTER — Ambulatory Visit (INDEPENDENT_AMBULATORY_CARE_PROVIDER_SITE_OTHER): Payer: Medicare Other | Admitting: Nurse Practitioner

## 2022-06-13 ENCOUNTER — Encounter (INDEPENDENT_AMBULATORY_CARE_PROVIDER_SITE_OTHER): Payer: Self-pay | Admitting: Nurse Practitioner

## 2022-06-13 DIAGNOSIS — Z01818 Encounter for other preprocedural examination: Secondary | ICD-10-CM

## 2022-06-13 NOTE — Anesthesia Preprocedure Evaluation (Addendum)
ANESTHESIA PRE-OPERATIVE EVALUATION    Patient Information    Name: Tiffany Chen    MRN: [id number]    DOB: 1945/11/09    Age: 77 year old    Sex: female  Procedure(s):  Flexible sigmoidoscopy, possible biopsy, possible polypectomy      Pre-op Vitals:   There were no vitals taken for this visit.        Primary language spoken:  English    ROS/Medical History:      History of Present Illness: 77 yo f sf; Flexible sigmoidoscopy, possible biopsy, possible polypectomy   Cc has ileostomy  dos 6/13 [facility]   pmhx: history of bronchiectasis, cavitary MAC lung infection, ruptured diverticulitis s/p colostomy 2009, ICH s/p embolization, gerd , tremors, hld, niddm  A1c 6.7%      General:  negative for General ROS  Mets=4 Cardiovascular:  no CAD/Angina/MI/CABG/Stents, Anti-platelet drugs: No  hypertension,  patients denies, cp, sob, MI, stents, DOE, recent URI sx's    Anesthesia History:  negative anesthesia history ROS   Pulmonary:   no sleep apnea,  no recent URI/pneumonia,  H/o s/p Robotic right upper lobectomy with en bloc lower and middle lobe wedge resection, middle lobe wedge resection    Acquired tracheobronchomegaly with bronchiectasis   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 with en bloc lower and middle lobe wedge resection, middle lobe wedge resection    Completed all abx and lung treatments as of 06/08/22       Neuro/Psych:   negative for TIA/CVA,  no seizures,  psychiatric history (depression),  Tremors    Ich --s/p embolization,sp craniotomy  Hematology/Oncology:   history of cancer (skin),      GI/Hepatic:  GERD, well controlled,  no bowel prep,   Infectious Disease:  negative for infectious disease     Renal:  negative renal ROS   Endocrine/Other:  diabetes, oral meds only, DM II,      Pregnancy History:   Pediatrics:         Pre Anesthesia Testing (PCC/CPC) notes/comments:    [facility] Test & records reviewed by [facility] Provider.                                        Physical Exam    Airway:      Inter-inciser distance 3-4 cm      Mallampati: II  Neck ROM: full  TM distance: 5-6 cm            Cardiovascular:      Rhythm: regular   Rate: normal          Pulmonary:         breath sounds clear to auscultation          Neuro/Neck/Skeletal/Skin:      Dental:      Abdominal:              Last OSA (STOP BANG) Score:   Has a physician diagnosed you with sleep apnea?: No  Do you use a CPAP at home?: No  Do you snore loudly (loud enough to be heard through a closed door)?: 0  Do you often feel tired, fatigued or sleepy during the day?: 0  Has anyone observed that you stop breathing while you are sleeping?: 0  Have you ever been treated for high blood pressure?: 1  OSA total score (A score of 2  or more is high risk. 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     Past Surgical History:   Procedure Laterality Date    BRONCHOSCOPY  12/2020    CESAREAN SECTION, LOW TRANSVERSE      COLOSTOMY      CRANIOTOMY      LUNG LOBECTOMY       Social History      Socioeconomic History    Marital status: Widowed   Tobacco Use    Smoking status: Never    Smokeless tobacco: Never   Vaping Use    Vaping status: Never Used   Substance and Sexual Activity    Alcohol use: Yes     Comment: glass of wine every other month    Drug use: Not Currently    Sexual activity: Not Currently   Other Topics Concern    Military Service No    Blood Transfusions No    Caffeine Concern No    Occupational Exposure No    Hobby Hazards Yes     Comment: Gardening, MAC    Sleep Concern No    Stress Concern Yes     Comment: Husband death Cov19, 07-19-2018, sold home, rehomed pets. [place]    Weight Concern No    Special Diet Yes    Back Care No    Exercises Regularly Yes    Bike Helmet Use No    Seat Belt Use No    Performs Self-Exams No   Social History Narrative    Lives with brother Fayrene Fearing at home. Sister also lives in [facility]     Social Determinants of Health     Food Insecurity: No Food Insecurity (03/25/2022)    Hunger Vital Sign     Worried About Running Out of Food in the Last Year: Never true     Ran Out of Food in the Last Year: Never true   Transportation Needs: No Transportation Needs (03/25/2022)    PRAPARE - Therapist, art (Medical): No     Lack of Transportation (Non-Medical): No   Intimate Partner Violence: Low Risk  (03/25/2022)    [place] IPV     Have you ever been emotionally or physically abused by your partner or someone important to you?: No     Within the last year have you been hit, slapped, kicked or otherwise physically hurt by someone?: No     Within the last year has anyone forced you to have sexual activities?: No  Are you afraid of your spouse or partner you listed above?: No   Housing Stability: Unknown (03/25/2022)    Housing Stability Vital Sign     Unable to Pay for Housing in the Last Year: No     Unstable Housing in the Last Year: No     Alcohol Use: Not on file       Current Outpatient Medications   Medication Sig Dispense Refill    atorvastatin  (LIPITOR) 10 MG tablet Take 1 tablet (10 mg) by mouth daily. 90 tablet 2    biotin 1000 MCG tablet Take 1 tablet (1,000 mcg) by mouth daily.      blood glucose meter PHARMACY: Please dispense insurance approved bluetooth enable meter. 1 each 0    ferrous sulfate 325 (65 Fe) MG tablet Take 1 tablet (325 mg) by mouth daily.      fluoxetine (PROZAC) 40 MG capsule Take 1 capsule (40 mg) by mouth daily. 90 capsule 3    glucose blood test strip 1 strip by Other route every morning (before breakfast). 100 strip 2    glucose blood test strip 1 strip by Other route 2 times daily (before meals). 100 strip 11    lancets 1 each by Other route every morning (before breakfast). 100 each 2    lancets 1 each by Other route 2 times daily (before meals). 100 Lancet. 5    metFORMIN (GLUCOPHAGE) 500 mg tablet Take 1 tablet (500 mg) by mouth 2 times daily (with meals). 180 tablet 1    Multiple Vitamin (MULTIVITAMIN) TABS tablet Take 1 tablet by mouth daily.      omeprazole (PRILOSEC) 20 MG capsule TAKE 1 CAPSULE BY MOUTH IN  THE MORNING BEFORE  BREAKFAST 90 capsule 3    psyllium (METAMUCIL) WAFR Take 2 Wafers by mouth every 12 hours as needed (As needed to maintain ileostomy output <1.2L). 24 packet 0    ramipril (ALTACE) 2.5 MG capsule Take 1 capsule (2.5 mg) by mouth daily. 90 capsule 1    sodium chloride 7 % NEBU USE 1 VIAL VIA NEBULIZER  EVERY 12 HOURS 720 mL 3    umeclidinium-vilanterol (ANORO ELLIPTA) 62.5-25 MCG/ACT inhaler Inhale 1 puff by mouth daily. 60 each 11    VENTOLIN HFA 108 (90 Base) MCG/ACT inhaler USE 2 INHALATIONS BY MOUTH  EVERY 6 HOURS AS NEEDED FOR WHEEZING 72 g 3    vitamin D3 125 MCG (5000 UT) capsule Take 1 capsule (5,000 Units) by mouth every 24 hours.      vitamin E 400 UNIT capsule Take 1 capsule (400 Units) by mouth daily.       No current facility-administered medications for this visit.     Allergies   Allergen Reactions    Walnuts Swelling     Tongue swollen and burn       Labs and Other Data  Lab  Results   Component Value Date    NA 140 06/06/2022    K 4.2 06/06/2022    CL 106 06/06/2022    BICARB 24 06/06/2022    BUN 14 06/06/2022    CREAT 0.77 06/06/2022    GLU 141 (H) 06/06/2022    [place] 9.9 06/06/2022     Lab Results   Component Value Date    AST 21 06/06/2022    ALT 22 06/06/2022    ALK 59 06/06/2022    TP 6.7 06/06/2022    ALB 4.2 06/06/2022    TBILI 0.46 06/06/2022  DBILI <0.2 07/17/2020     Lab Results   Component Value Date    WBC 3.9 (L) 06/06/2022    RBC 3.93 06/06/2022    HGB 12.3 06/06/2022    HCT 36.0 06/06/2022    MCV 91.6 06/06/2022    MCHC 34.2 06/06/2022    RDW 13.4 06/06/2022    PLT 207 06/06/2022    MPV 10.9 06/06/2022    SEG 61.7 06/06/2022    LYMPHS 21.7 06/06/2022    MONOS 12.1 06/06/2022    EOS 3.4 06/06/2022    BASOS 0.8 06/06/2022     No results found for: "INR", "PTT"  No results found for: "ARTPH", "ARTPO2", "ARTPCO2"    Anesthesia Plan:  Risks and Benefits of Anesthesia  I have personally performed an appropriate pre-anesthesia physical exam of the patient (including heart, lungs, and airway) prior to the anesthetic and reviewed the pertinent medical history, drug and allergy history, laboratory and imaging studies and consultations.   I have determined that the patient has had adequate assessment and testing.  I have validated the documentation of these elements of the patient exam and/or have made necessary changes to reflect my own observations during my pre-anesthesia exam.  Anesthetic techniques, invasive monitors, anesthetic drugs for induction, maintenance and post-operative analgesia, risks and alternatives have been explained to the patient and/or patient's representatives.    I have prescribed the anesthetic plan:         Planned anesthesia method: General         ASA 2 (Mild systemic disease)     Potential anesthesia problems identified and risks including but not limited to the following were discussed with patient and/or patient's representative: Dental injury or sore  throat and Patient declined further  discussion of risks of anesthesia        Planned monitoring method: Routine monitoring    Informed Consent:  Anesthetic plan and risks discussed with Patient.    Plan discussed with Surgeon.

## 2022-06-13 NOTE — Patient Instructions (Signed)
PREOPERATIVE SURGICAL INFORMATION     Your surgery is currently scheduled at [facility] on 6/13  The scheduler will be contacting you with the check in time                                                                         FACILITY ADDRESS    [facility], [place], [address], [place], [place] [address]    Please check in at [facility] [employer], [place], 1st floor.     PARKING    For [employer]:   [employer] (at the end of [address]) or in [address] (at [employer] and [employer]   Valet: [place] parking is available between 8 a.m. and 4:30 p.m. weekdays at the main entrance to the [employer] on [address]. The cost is the same as self parking. With a handicapped placard, valet parking is free.   [email or web address]       QUESTIONS    If you have any questions between now and the day of your surgery, please do not hesitate to call:     [facility] Anesthesia Preparedness [facility]: [phone]     DAY OF SURGERY ARRIVAL TIME:    On the day of your Surgery/Procedure, please arrive at the time provided by the surgeon's clinic. If you have any questions regarding your arrival time, please call the surgeon's clinic:      Preoperative Surgical Admissions at [facility]: [phone]        MEDICATION INSTRUCTIONS:       MEDICATION INSTRUCTIONS BEFORE SURGERY/PROCEDURE:      Medications to hold prior to surgery: No------metformin  the morning of surgery     No------ramipril -------the evening before or the morning of surgery   OK to take the following medications as scheduled with a small sip of water on the morning of surgery: use all inhalers     PLEASE HOLD ALL  aspirin, NSAIDS (non-steroidal anti-inflammatory drugs) SUCH AS advil, aleve, motrin, ibuprofen, relafen, lodine, feldene, Diclofenac, voltaren, indomethacin, naproxen, celebrex, Mobic 7 days before surgery.       Please hold vitamins, supplements,  CO-Q10, Glucosamine, herbs & fish oil 7 days before surgery. Vitamin C, D are ok to take.     It is OK to take acetaminophen (Tylenol) for pain around the time of surgery unless you have liver disease.      AFTER YOUR VISIT WITH [place], IF YOU START TAKING A NEW MEDICATION BEFORE SURGERY, PLEASE CALL us TO MAKE SURE IT IS SAFE TO TAKE & WILL NOT AFFECT YOUR SURGERY.         OSA INSTRUCTIONS:     KOP/HC/JMC: If you use a CPAP machine, please bring the entire machine, including mask and tubing, with you on the day of surgery.            EATING/DRINKING        DO NOT EAT OR DRINK ANYTHING AFTER MIDNIGHT ON THE DAY OF SURGERY    Preparing for your Surgery:     Please wear clean loose-fitting clothes and leave valuables at home   Do not shave or remove body hair. Facial shaving is permitted. If you are  having head surgery, ask your doctor whether you can shave.  Bring a picture ID and your insurance card, and be prepared to pay your deductible or co-insurance by cash, check, or credit card when you arrive.   If you are going home after your surgery, please make sure to arrange for an adult to drive you home. You CANNOT use UBER or LYFT. If you do not have a ride, your surgery may be cancelled.       On The Day of Your Surgery:      Check in at the location mentioned above   COVID testing may be done on arrival to the Pre-op or Procedural areas according to the current CDPH mandate.   If you are a woman of child bearing age, please note that you may be asked to give a urine sample upon check-in  You will meet your anesthesia and surgery teams in the preoperative holding area before surgery.   Once surgery is over, you will wake up in the recovery room.  If you go home, an adult chaperone will need to stay with you for the first 24 hours after surgery.   Visitor policy during the XFGHW-29 pandemic is subject to change.No more that two visitors are allowed per patient. Current visitor policy can be found at  [email or web address]      A video about what to expect for the day of surgery can be found here:    [email or web address]  Or by searching "You-tube" for [place] before surgery and [place] after surgery     You medical records are available to you at [email or web address] click sign up now.

## 2022-06-14 ENCOUNTER — Encounter (INDEPENDENT_AMBULATORY_CARE_PROVIDER_SITE_OTHER): Payer: Self-pay | Admitting: Hospital

## 2022-06-14 ENCOUNTER — Telehealth (HOSPITAL_BASED_OUTPATIENT_CLINIC_OR_DEPARTMENT_OTHER): Payer: Self-pay | Admitting: Critical Care Medicine

## 2022-06-14 DIAGNOSIS — R590 Localized enlarged lymph nodes: Secondary | ICD-10-CM

## 2022-06-14 DIAGNOSIS — J479 Bronchiectasis, uncomplicated: Secondary | ICD-10-CM

## 2022-06-14 DIAGNOSIS — A310 Pulmonary mycobacterial infection: Secondary | ICD-10-CM

## 2022-06-14 NOTE — Telephone Encounter (Signed)
Spoke to patient confirmed Surgery Date & Time      Pt agreeable to all, Mychart sent to patient with [facility]. Pt aware Surgery time is subject to change

## 2022-06-14 NOTE — Telephone Encounter (Signed)
Pt calling upset that her appt from 06/18/22 was rescheduled with out being notified. Pt states she had important test results she was looking forward to discussing and would like to verify her scans were reviewed by MD and see if there was anything that can be done about the appointment rescheduled. Agent looked up sooner availability with NP and MD but no appt available. Please advise.     Ph #. [phone]

## 2022-06-17 ENCOUNTER — Ambulatory Visit (INDEPENDENT_AMBULATORY_CARE_PROVIDER_SITE_OTHER): Admitting: Professional

## 2022-06-18 ENCOUNTER — Ambulatory Visit (HOSPITAL_BASED_OUTPATIENT_CLINIC_OR_DEPARTMENT_OTHER): Payer: Medicare Other | Admitting: Nurse Practitioner

## 2022-06-19 ENCOUNTER — Encounter (HOSPITAL_BASED_OUTPATIENT_CLINIC_OR_DEPARTMENT_OTHER): Payer: Self-pay

## 2022-06-19 NOTE — Telephone Encounter (Signed)
Patient called to inquire about results. She had an appt 6/11 that was rescheduled without her knowledge.     Impression:   - 77 yr old with bronchiectasis, cavitary MAC lung infection S/P antibiotic treatment 08/14/2020- 06/08/2022. 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     RN to call patent:   - CT scan 05/20/2022:   - from a MAC infection standpoint: very reassuring. Stable compared to 06/22/2021 CT scan, which suggests lack of active infection.   - there is another issue: the enlarged lymph nodes. This has persisted. This should have improved with treatment of the infection but it has not.   - Plan: perform PET CT, which measures activity in the lymph nodes by uptake of dye. This will help ascertain chance of malignancy. (Chance is very low since mostly stable compared to one year ago. We are just being overcautious since it did not improve with treatment. Lynph nodes enlarge in reaction to infection)   - WBC low at 3.9. Very slightly below lower limit o normal so it would not affect ability to fight infection. This can be a side effect of the antibiotic but it would be unusual to happen after all this time. Plan to repeat CBC. Order placed.

## 2022-06-19 NOTE — Telephone Encounter (Signed)
MACRN: pt called and reviewed results  WBC and Chest Ct results  PLAN:  Schedule PETCT-phone# given  -repeat blood draw WBC

## 2022-06-19 NOTE — Telephone Encounter (Signed)
Patient states it is ok to keep appt but she is very concerned about her results and wants to know about being in remission. Patient wants to be sure someone looked at her results and is aware if they are or aren't abnormal.

## 2022-06-20 ENCOUNTER — Ambulatory Visit (HOSPITAL_COMMUNITY): Payer: Medicare Other | Admitting: Nurse Practitioner

## 2022-06-20 ENCOUNTER — Ambulatory Visit
Admission: RE | Admit: 2022-06-20 | Discharge: 2022-06-20 | Disposition: A | Discharge status: Home Health | Payer: Medicare Other | Attending: Surgery | Admitting: Surgery

## 2022-06-20 ENCOUNTER — Ambulatory Visit (HOSPITAL_COMMUNITY): Payer: Medicare Other | Admitting: Student in an Organized Health Care Education/Training Program

## 2022-06-20 ENCOUNTER — Encounter (HOSPITAL_COMMUNITY): Admission: RE | Disposition: A | Discharge status: Home Health | Payer: Self-pay | Attending: Surgery

## 2022-06-20 DIAGNOSIS — Z48815 Encounter for surgical aftercare following surgery on the digestive system: Secondary | ICD-10-CM | POA: Insufficient documentation

## 2022-06-20 DIAGNOSIS — Z902 Acquired absence of lung [part of]: Secondary | ICD-10-CM | POA: Insufficient documentation

## 2022-06-20 DIAGNOSIS — E785 Hyperlipidemia, unspecified: Secondary | ICD-10-CM | POA: Insufficient documentation

## 2022-06-20 DIAGNOSIS — Z932 Ileostomy status: Secondary | ICD-10-CM

## 2022-06-20 DIAGNOSIS — Z8719 Personal history of other diseases of the digestive system: Secondary | ICD-10-CM | POA: Insufficient documentation

## 2022-06-20 DIAGNOSIS — Z049 Encounter for examination and observation for unspecified reason: Secondary | ICD-10-CM

## 2022-06-20 DIAGNOSIS — K5289 Other specified noninfective gastroenteritis and colitis: Secondary | ICD-10-CM | POA: Insufficient documentation

## 2022-06-20 DIAGNOSIS — Z85828 Personal history of other malignant neoplasm of skin: Secondary | ICD-10-CM | POA: Insufficient documentation

## 2022-06-20 DIAGNOSIS — Z9049 Acquired absence of other specified parts of digestive tract: Secondary | ICD-10-CM | POA: Insufficient documentation

## 2022-06-20 DIAGNOSIS — Z409 Encounter for prophylactic surgery, unspecified: Secondary | ICD-10-CM

## 2022-06-20 DIAGNOSIS — E119 Type 2 diabetes mellitus without complications: Secondary | ICD-10-CM | POA: Insufficient documentation

## 2022-06-20 DIAGNOSIS — K219 Gastro-esophageal reflux disease without esophagitis: Secondary | ICD-10-CM | POA: Insufficient documentation

## 2022-06-20 DIAGNOSIS — Z8709 Personal history of other diseases of the respiratory system: Secondary | ICD-10-CM | POA: Insufficient documentation

## 2022-06-20 DIAGNOSIS — F32A Depression, unspecified: Secondary | ICD-10-CM | POA: Insufficient documentation

## 2022-06-20 DIAGNOSIS — I10 Essential (primary) hypertension: Secondary | ICD-10-CM

## 2022-06-20 DIAGNOSIS — Z432 Encounter for attention to ileostomy: Secondary | ICD-10-CM | POA: Insufficient documentation

## 2022-06-20 DIAGNOSIS — R251 Tremor, unspecified: Secondary | ICD-10-CM | POA: Insufficient documentation

## 2022-06-20 DIAGNOSIS — Z7984 Long term (current) use of oral hypoglycemic drugs: Secondary | ICD-10-CM | POA: Insufficient documentation

## 2022-06-20 LAB — GLUCOSE (POCT): Glucose (POCT): 122 mg/dL — ABNORMAL HIGH (ref 70–99)

## 2022-06-20 SURGERY — SIGMOIDOSCOPY, FLEXIBLE
Anesthesia: General | Wound class: Class III (Contaminated)

## 2022-06-20 MED ORDER — ACETAMINOPHEN 325 MG PO TABS
975.0000 mg | ORAL_TABLET | Freq: Once | ORAL | Status: AC
Start: 2022-06-20 — End: 2022-06-20
  Administered 2022-06-20: 975 mg via ORAL
  Filled 2022-06-20: qty 3

## 2022-06-20 MED ORDER — MENTHOL 3 MG MT LOZG
1.0000 | LOZENGE | OROMUCOSAL | Status: DC | PRN
Start: 2022-06-20 — End: 2022-06-20

## 2022-06-20 MED ORDER — LACTATED RINGERS IV SOLN
INTRAVENOUS | Status: DC
Start: 2022-06-20 — End: 2022-06-20

## 2022-06-20 MED ORDER — HYDROMORPHONE HCL 1 MG/ML IJ SOLN
0.5000 mg | INTRAMUSCULAR | Status: DC | PRN
Start: 2022-06-20 — End: 2022-06-20

## 2022-06-20 MED ORDER — NALOXONE HCL 0.4 MG/ML IJ SOLN
0.1000 mg | INTRAMUSCULAR | Status: DC | PRN
Start: 2022-06-20 — End: 2022-06-20

## 2022-06-20 MED ORDER — PROPOFOL 200 MG/20ML IV EMUL
INTRAVENOUS | Status: DC | PRN
Start: 2022-06-20 — End: 2022-06-20
  Administered 2022-06-20: 50 mg via INTRAVENOUS

## 2022-06-20 MED ORDER — ONDANSETRON HCL 4 MG/2ML IV SOLN
4.0000 mg | Freq: Once | INTRAMUSCULAR | Status: DC | PRN
Start: 2022-06-20 — End: 2022-06-20

## 2022-06-20 MED ORDER — FENTANYL CITRATE (PF) 50 MCG/ML IJ SOLN (WRAPPED RECORD) ~~LOC~~
25.0000 ug | INTRAMUSCULAR | Status: DC | PRN
Start: 2022-06-20 — End: 2022-06-20

## 2022-06-20 MED ORDER — FENTANYL CITRATE (PF) 50 MCG/ML IJ SOLN (WRAPPED RECORD) ~~LOC~~
50.0000 ug | INTRAMUSCULAR | Status: DC | PRN
Start: 2022-06-20 — End: 2022-06-20

## 2022-06-20 MED ORDER — LACTATED RINGERS IV SOLN
INTRAVENOUS | Status: DC | PRN
Start: 2022-06-20 — End: 2022-06-20

## 2022-06-20 MED ORDER — PROPOFOL 1000 MG/100ML IV EMUL
INTRAVENOUS | Status: DC | PRN
Start: 2022-06-20 — End: 2022-06-20
  Administered 2022-06-20: 165 ug/kg/min via INTRAVENOUS

## 2022-06-20 MED ORDER — OXYCODONE HCL 5 MG OR TABS
5.0000 mg | ORAL_TABLET | ORAL | Status: DC | PRN
Start: 2022-06-20 — End: 2022-06-20

## 2022-06-20 MED ORDER — LIDOCAINE HCL 2 % IJ SOLN WRAPPED RECORD
INTRAMUSCULAR | Status: DC | PRN
Start: 2022-06-20 — End: 2022-06-20
  Administered 2022-06-20: 60 mg via INTRAVENOUS

## 2022-06-20 MED ORDER — LIDOCAINE HCL (PF) 1 % IJ SOLN
0.1000 mL | Freq: Once | INTRAMUSCULAR | Status: DC | PRN
Start: 2022-06-20 — End: 2022-06-20

## 2022-06-20 SURGICAL SUPPLY — 11 items
CONTAINER PRECISION SPECIMEN, 4OZ- STERILE (Misc Medical Supply) IMPLANT
DRESSING SPONGE CURITY 4X4 16 PLY (Dressings/packing) IMPLANT
DRESSING TELFA 3" X 4" STRL (Dressings/packing)
GLOVE SURGEON BIOGEL SIZE 7.5 (Gloves/Gowns) ×1 IMPLANT
GOWN MICRO COOL LG BLUE, AAMI LVL 4 (Gloves/Gowns) ×2
LUBRICANT 2OZ STERILE PACKAGE (Misc Medical Supply) ×1
SOLUTION IRR POUR BTL H20 1000ML (Non-Pharmacy Meds/Solutions) ×2
SPONGE ENDOZIME SLR (Misc Surgical Supply) ×1
TUBING IRRIGATION ENDOGATOR (Tubing/Suction) ×1 IMPLANT
TUBING SUCTION MEDI-VAC 9/32" X 20' (Tubing/Suction) ×1 IMPLANT
VALVE CONNECTOR DEFENDO, SINGLE USE (Misc Surgical Supply) ×1 IMPLANT

## 2022-06-20 NOTE — Discharge Instructions (Addendum)
[facility] Surgery Discharge Instructions    You must have a responsible adult drive you home or ride with you if you take a taxi. After surgery you may not feel like your normal self immediately. Some people "bounce back" faster than others; some people need more time to feel good. Rest and take things slowly. Do not make important decisions or sign important contracts for at least 24 hours after your surgery. For your safety and well being, you should not drive, drink alcohol, or engage in vigorous activities or exercise for 24 hours following surgery.    You may experience any of the following after an operation:  A sore throat if you were put to sleep and a breathing tube was placed in your throat.  Sleepiness from anesthesia or other drugs given to you prior to or during your surgery.  Fatigue just from having surgery/procedure.  Nausea occurs sometimes, but depends largely on your reaction to surgery and drugs.  These discomforts should improve rapidly and are usually gone the day following surgery.    You may speed your own recovery by the following precautions and self care:    1. Dressing and/or incision care:  For Flexible sigmoidoscopy and colonoscopy there is no dressing or wound.    2. Diet:   Progress to your normal diet as tolerated.     3. Activity:  Avoid driving or operating machinery while you require pain medication    4. Bathing:  You may shower today if no incisions as in Flexible sigmoidoscopy and colonoscopy procedures    5. Medications:  You may resume your previous medication schedule.    6. Return Appointment:  Follow up with Dr Tonita Phoenix in clinic 07/16/22      Call your doctor or come to the Emergency Department at [facility] if you have:  severe chills or fever  excessive pain, swelling, or odor at the site of your surgery  fainting, trouble breathing, or persistent dizziness  unable to urinate      For any concerns, call [phone]. After hours and weekends, you may call the  [facility] Page Operator at [phone] and ask for the Doctor On-Call for [facility] Surgery.

## 2022-06-20 NOTE — Anesthesia Postprocedure Evaluation (Signed)
Anesthesia Post Note    Patient: Tiffany Chen    Procedure(s) Performed: Procedure(s):  Flexible sigmoidoscopy      Final anesthesia type: General    Patient location: PACU    Post anesthesia pain: adequate analgesia    Mental status: awake, alert , and oriented    Airway Patent: Yes    Last Vitals:   Vitals Value Taken Time   BP 103/56 06/20/22 0816   Temp 36.8 C 06/20/22 0735   Pulse 66 06/20/22 0818   Resp 21 06/20/22 0818   SpO2 99 % 06/20/22 0818   Vitals shown include unfiled device data.     Post vital signs: stable    Hydration: adequate    N/V:no    Anesthetic complications: no    Plan of care per primary team.

## 2022-06-20 NOTE — H&P (Signed)
History and Physical      Referring MD:  Monia Sabal, MD  [address]  [place],  [place] [address]     History of Present Illness:     Tiffany Chen is a 77 year old female who is here for evaluation.  Patient with history of open Hartman procedure for perforated diverticulitis in 2009.  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  Colostomy functioning well and appliance seating well without leakage.  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 Chestine Spore in March 2023.  Patient recovered well.  Currently being evaluated by Dr. Orlin Hilding for paraesophageal hernia as possible cause of chronic reflux with micro aspirations     I have independently reviewed and interpreted the following imaging findings and agree with radiologist's interpretation except as stated below and discussed them with the patient.     CT Ch/A/P 06/22/21:  No leak at the 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 Post interval right upper lobectomy with a small residual right hydropneumothorax.      2. Scattered tree-in-bud airspace opacities throughout the lungs, similar to slightly improved.  Previously seen patchy groundglass opacities within the lungs appear improved.    3. Mediastinal lymphadenopathy.  This appears slightly increased in comparison to the prior study     Interval History and Exam on 04/16/22:  Patient s/p open colostomy takedown with low anterior resection and colorectal anastomosis, open splenic flexure mobilization, creation of diverting loop ileostomy, lysis of adhesions, flexible sigmoidoscopy, intraoperative bowel perfusion assessment on 03/25/22.  Patient tolerating diet.  Denies abdominal pain.  Ostomy with applesauce output, empyting 3-5 times per day.  On low fiber diet.  Occasional mucus passage via rectum. Denies signs/symptoms  of dehydration     Pathology:   A: Rectum, resection:   -Colonic tissue with focal mucin extravasation in the subserosa and serosal   adhesion.   -No dysplasia or malignancy identified.   -Viable resection margins.   B: Colon, resection with colostomy:   -Colocutaneous tissue with diverticulum and mild chronic inflammation.   -Pericolonic fibroadipose tissue with congestion and hemorrhage.   -Viable resection margins.   C: Omentum, resection:   -Benign fibroadipose tissue with congestion and hemorrhage.         No chief complaint on file.        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)      s/p Robotic right upper lobectomy with en bloc lower and middle lobe wedge resection, middle lobe wedge resection 03/20/2021     03/20/21 Robotic right upper lobectomy with en  bloc lower and middle lobe wedge resection, middle lobe wedge resection (Onaitis)         Patient denies history of: colorectal cancer, Crohn's disease and ulcerative colitis     Past Surgical History         Past Surgical History:   Procedure Laterality Date    BRONCHOSCOPY   12/2020    CESAREAN SECTION, LOW TRANSVERSE        COLOSTOMY        CRANIOTOMY        LUNG LOBECTOMY                      Allergies   Allergen Reactions    Walnuts Swelling        Tongue swollen and burn           Current Outpatient Medications   Medication Sig    acetaminophen (TYLENOL) 325 MG tablet Take 2 tablets (650 mg) by mouth every 8 hours.    ARIKAYCE 590 MG/8.4ML SUSP Inhale the contents of one vial (590mg ) via Lamira device 1 time daily as directed.    atorvastatin (LIPITOR) 10 MG tablet Take 1 tablet (10 mg) by mouth daily.    azithromycin (ZITHROMAX) 250 MG tablet TAKE 1 TABLET BY MOUTH  DAILY    CLOFAZIMINE IRB [id number] 50 MG CAPSULE Take 2 capsules (100mg ) by mouth once daily with food as directed.    ethambutol (MYAMBUTOL) 400 MG tablet TAKE 2 TABLETS BY MOUTH  DAILY    ferrous sulfate 325 (65 Fe) MG tablet Take 1 tablet (325 mg) by mouth daily.    fluoxetine (PROZAC) 40 MG capsule Take 1 capsule (40 mg) by mouth daily.    metFORMIN (GLUCOPHAGE) 500 mg tablet Take 1 tablet (500 mg) by mouth 2 times daily (with meals).    omeprazole (PRILOSEC) 20 MG capsule TAKE 1 CAPSULE BY MOUTH IN  THE MORNING BEFORE  BREAKFAST    oxyCODONE (ROXICODONE) 5 MG immediate release tablet Take 1 tablet (5 mg) by mouth every 4 hours as needed for Severe Pain (Pain Score 7-10).    psyllium (METAMUCIL) WAFR Take 2 Wafers by mouth every 12 hours as needed (As needed to maintain ileostomy output <1.2L).    ramipril (ALTACE) 2.5 MG capsule Take 1 capsule (2.5 mg) by mouth daily.    rifAMPin (RIFADIN) 300 MG capsule TAKE 2 CAPSULES BY MOUTH  DAILY    sodium chloride 7 % NEBU USE 1 VIAL VIA NEBULIZER  EVERY 12 HOURS    umeclidinium-vilanterol (ANORO ELLIPTA) 62.5-25 MCG/ACT inhaler Inhale 1 puff by mouth daily.    VENTOLIN HFA 108 (90 Base) MCG/ACT inhaler USE 2 INHALATIONS BY MOUTH  EVERY 6 HOURS AS NEEDED FOR WHEEZING    vitamin D3 125 MCG (5000 UT) capsule Take 1 capsule (5,000 Units) by mouth every 24 hours.          Current Facility-Administered Medications   Medication    lidocaine (XYLOCAINE) 2 % viscous solution 1 mL    lidocaine (XYLOCAINE) 4 % topical         Social History                Socioeconomic History    Marital status: Widowed   Tobacco Use    Smoking status: Never    Smokeless tobacco: Never   Vaping Use    Vaping status: Never Used   Substance and Sexual Activity    Alcohol use: Yes  Comment: glass of wine every other month    Drug use: Not Currently    Sexual activity: Not Currently   Social Activities of Daily Living Present    Military Service No    Blood Transfusions No    Caffeine Concern No    Occupational Exposure No    Hobby Hazards Yes       Comment: Gardening, MAC    Sleep Concern No    Stress Concern Yes       Comment: Husband death Cov38, Jul 07, 2018, sold home, rehomed pets. [place]    Weight Concern No    Special Diet Yes    Back Care No    Exercises Regularly Yes    Bike Helmet Use No    Seat Belt Use No    Performs Self-Exams No   Social History Narrative     Lives with brother Fayrene Fearing at home. Sister also lives in [place]         Family History          Family History   Problem Relation Name Age of Onset    Cancer Mother Denton Lank ngs and throat    Heart Disease Mother Joya Martyr           Congrestive heart failure due to cancer    Hypertension Mother Joya Martyr      Psychiatry Mother Joya Martyr           Bi polar, manic depressive    Cancer Father Jamal Maes           Prostate    Diabetes Father Jamal Maes      Stroke Father Jamal Maes      Stroke M Grandmother Winifred Larita Fife      Cancer Brother Vallarie Mare           Skin (many locations)    Diabetes Brother Vallarie Mare              Patient denies family history of: colorectal cancer, Crohn's disease and ulcerative colitis     Review of Systems: A complete review of systems was performed and is negative except as documented in HPI.     Physical Exam:  There were no vitals taken for this visit.        General Appearance: healthy, alert, no distress, pleasant affect, cooperative, skin warm, dry, and pink.  Eyes:  Conjunctivae and corneas clear. EOM's intact. Fundi benign.  Mouth: Normal.  Neck:  Neck supple. No  adenopathy.  Heart:  Normal rate and regular rhythm  Lungs: No increased work of breathing.  Abdomen:soft, non-tender, non-distended, well-healed midline scar, right sided ileostomy pink and patent   Extremities:  No cyanosis, clubbing, or edema.  Skin:  Negative.           Assessment and Care Plan:      ICD-10-CM ICD-9-CM     1. 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  Will evaluate integrity of colorectal anastomosis with flexible sigmoidoscopy followed by gastrografin enema       The risks, benefits and alternatives of the operation were discussed with the patient.  The risks included but not limited to scope-related complications including bleeding and perforation requiring emergency surgery.     Plan for flexible sigmoidoscopy followed by gastrograffin enema  Ostomy nurse consultation for education  RTC following work-up to discuss ileostomy takedown     Note Author: Dorian Furnace, MD

## 2022-06-20 NOTE — Brief Op Note (Signed)
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 Anastomosis appears healthy and intact.    SPECIMENS: * No specimens in log *    Fluids/Blood Products:  IV Fluids: 0ml  Blood Products: 0ml  EBL: 2ml  Urine Output: 0ml    COMPLICATIONS: None identified    DISPOSITION: PACU then home    Garnett Farm, MD  General Surgery Resident  [id number]

## 2022-06-20 NOTE — Op Note (Signed)
DATE OF SERVICE:    06/20/2022    Name:  Tiffany Chen    MRN:  [id number]    DOB:  29-Jan-1945     PREOPERATIVE DIAGNOSIS:  Ileostomy in place, history of low anterior resection with colorectal anastomosis     POSTOPERATIVE DIAGNOSIS:  Ileostomy in place, history of low anterior resection with colorectal anastomosis     PROCEDURE PERFORMED:  Flexible sigmoidoscopy.     SURGEON/STAFF:  Dorian Furnace, MD     ESTIMATED BLOOD LOSS:  Minimal.     ANESTHESIA:  MAC.     SPECIMEN:  None.     DESCRIPTION OF OPERATION:  The risks, benefits, and alternatives to  the operation were discussed with the patient.  The risks included,  but not limited to, scope-related complications including bleeding  and perforation requiring emergency surgery.  Patient agreed to the  operation and signed the informed consent.  Patient was taken to the  operating room and placed in the left lateral decubitus position.  Peripheral IV was inserted by Anesthesia followed by IV sedation.  A  time-out was performed identifying the patient by name, date of  birth, MR number, and procedure to be performed.  Inspection of the  perianal skin and anal verge showed no lesions, masses, or ulcers.  Digital rectal exam palpated a patent and intact anastomosis at 8 cm from the anal verge.  Flexible sigmoidoscopy was performed up  to approximately 60 cm from the anal verge, and the colonic and  rectal mucosa was visualized upon withdrawal.  A patent and intact end to side colorectal anastomosis was identified at 8 cm from the anal verge.  The scope was removed.  I was present  for the entire case.  All instrument counts and sponge counts were  reported as correct at the end of the case.  The patient tolerated  procedure well and was transferred to the PACU in stable condition.

## 2022-06-25 ENCOUNTER — Encounter (HOSPITAL_BASED_OUTPATIENT_CLINIC_OR_DEPARTMENT_OTHER): Payer: Self-pay

## 2022-06-25 ENCOUNTER — Ambulatory Visit
Admission: RE | Admit: 2022-06-25 | Discharge: 2022-06-25 | Disposition: A | Discharge status: Home / Self Care | Payer: Medicare Other | Attending: Allergy | Admitting: Allergy

## 2022-06-25 VITALS — BP 99/61 | HR 66 | Temp 98.0°F | Resp 16 | Ht 63.0 in | Wt 144.4 lb

## 2022-06-25 DIAGNOSIS — D809 Immunodeficiency with predominantly antibody defects, unspecified: Secondary | ICD-10-CM | POA: Insufficient documentation

## 2022-06-25 MED ORDER — DEXTROSE 5 % IV SOLN
INTRAVENOUS | Status: DC
Start: 2022-06-25 — End: 2022-06-25

## 2022-06-25 MED ORDER — IMMUNE GLOBULIN (HUMAN) 20 GM/200ML IJ SOLN
0.5000 g/kg | Freq: Once | INTRAMUSCULAR | Status: AC
Start: 2022-06-25 — End: 2022-06-25
  Administered 2022-06-25: 25 g via INTRAVENOUS
  Filled 2022-06-25: qty 200

## 2022-06-25 MED ORDER — CETIRIZINE HCL 10 MG OR TABS
10.0000 mg | ORAL_TABLET | Freq: Once | ORAL | Status: AC
Start: 2022-06-25 — End: 2022-06-25
  Administered 2022-06-25: 10 mg via ORAL
  Filled 2022-06-25: qty 1

## 2022-06-25 MED ORDER — ACETAMINOPHEN 325 MG PO TABS
650.0000 mg | ORAL_TABLET | Freq: Once | ORAL | Status: AC
Start: 2022-06-25 — End: 2022-06-25
  Administered 2022-06-25: 650 mg via ORAL
  Filled 2022-06-25: qty 2

## 2022-06-25 NOTE — Interdisciplinary (Signed)
Nursing Note - [facility]  77 year old female @ [facility] for IVIG.     ICD-10-CM ICD-9-CM   1. 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   Access:  Left PIV  Treatment:  Patient tolerated infusion well and without complication.   PIV flushed, removed, and pressure dressing applied  Discharge Plan:  Return in 4 weeks (on 07/23/2022) for non chemo.  Discharge instructions given to patient.  Future appointments:request submitted.  Discharge Mode: Ambulatory  Discharge Time: 1150  Accompanied by: Self  Discharged To: Home   Treatment Administration:  Medications   dextrose 5% infusion (0 mL IntraVENOUS Completed 06/25/22 1145)   acetaminophen (TYLENOL) tablet 650 mg (650 mg Oral Given 06/25/22 0907)   cetirizine (ZYRTEC) tablet 10 mg (10 mg Oral Given 06/25/22 0907)   immune globulin (human) (IgG) 25 g in 250 mL (GAMUNEX-C) infusion (0 g IntraVENOUS Completed 06/25/22 1140)

## 2022-06-25 NOTE — Telephone Encounter (Signed)
MACRN: sent pt mychart message to remind to have repeated Blood drawn to recheck WBC    Noted PET/CT scheduled 07/15 appt Ledon NP 07/09

## 2022-07-02 ENCOUNTER — Telehealth (HOSPITAL_BASED_OUTPATIENT_CLINIC_OR_DEPARTMENT_OTHER): Payer: Self-pay | Admitting: Critical Care Medicine

## 2022-07-02 ENCOUNTER — Other Ambulatory Visit: Payer: Medicare Other

## 2022-07-02 DIAGNOSIS — J479 Bronchiectasis, uncomplicated: Secondary | ICD-10-CM

## 2022-07-02 NOTE — Telephone Encounter (Signed)
[facility] is calling to f/u for order that needs to be places for pts blood work. States that pt is there now but no order is in for pt. Please advise.     CB # [id number]

## 2022-07-02 NOTE — Addendum Note (Signed)
Encounter addended by: Lennie Muckle on: 07/02/2022 4:00 AM   Actions taken: Charge Capture section accepted

## 2022-07-02 NOTE — Addendum Note (Signed)
Addended by: Rico Ala on: 07/02/2022 02:52 PM     Modules accepted: Orders

## 2022-07-03 ENCOUNTER — Other Ambulatory Visit: Payer: Medicare Other

## 2022-07-03 DIAGNOSIS — J479 Bronchiectasis, uncomplicated: Secondary | ICD-10-CM | POA: Insufficient documentation

## 2022-07-03 DIAGNOSIS — E119 Type 2 diabetes mellitus without complications: Secondary | ICD-10-CM | POA: Insufficient documentation

## 2022-07-03 DIAGNOSIS — R5383 Other fatigue: Secondary | ICD-10-CM | POA: Insufficient documentation

## 2022-07-03 LAB — CBC WITH DIFF, BLOOD
ANC-Automated: 2.9 10*3/uL (ref 1.6–7.0)
Abs Basophils: 0.1 10*3/uL (ref <0.2)
Abs Eosinophils: 0.2 10*3/uL (ref 0.0–0.5)
Abs Lymphs: 1.6 10*3/uL (ref 0.8–3.1)
Abs Monos: 0.5 10*3/uL (ref 0.2–0.8)
Basophils: 0.9 %
Eosinophils: 3.6 %
Hct: 38 % (ref 34.0–45.0)
Hgb: 12.8 gm/dL (ref 11.2–15.7)
Imm Gran %: 0.2 % (ref <1)
Lymphocytes: 30.4 %
MCH: 30.4 pg (ref 26.0–32.0)
MCHC: 33.7 g/dL (ref 32.0–36.0)
MCV: 90.3 um3 (ref 79.0–95.0)
MPV: 10.4 fL (ref 9.4–12.4)
Monocytes: 9.7 %
Plt Count: 240 10*3/uL (ref 140–370)
RBC: 4.21 10*6/uL (ref 3.90–5.20)
RDW: 13.8 % (ref 12.0–14.0)
Segs: 55.2 %
WBC: 5.3 10*3/uL (ref 4.0–10.0)

## 2022-07-03 LAB — COMPREHENSIVE METABOLIC PANEL, BLOOD
ALT (SGPT): 28 U/L (ref 0–33)
AST (SGOT): 29 U/L (ref 0–32)
Albumin: 4.2 g/dL (ref 3.5–5.2)
Alkaline Phos: 64 U/L (ref 40–130)
Anion Gap: 11 mmol/L (ref 7–15)
BUN: 16 mg/dL (ref 8–23)
Bicarbonate: 26 mmol/L (ref 22–29)
Bilirubin, Tot: 0.36 mg/dL (ref <1.2)
Calcium: 9.7 mg/dL (ref 8.5–10.6)
Chloride: 102 mmol/L (ref 98–107)
Creatinine: 0.98 mg/dL — ABNORMAL HIGH (ref 0.51–0.95)
Glucose: 169 mg/dL — ABNORMAL HIGH (ref 70–99)
Potassium: 4.4 mmol/L (ref 3.5–5.1)
Sodium: 139 mmol/L (ref 136–145)
Total Protein: 6.7 g/dL (ref 6.0–8.0)
eGFR Based on CKD-EPI 2021 Equation: 59 mL/min/{1.73_m2}

## 2022-07-03 LAB — TSH, BLOOD: TSH: 3.64 u[IU]/mL (ref 0.27–4.20)

## 2022-07-03 LAB — FREE THYROXINE, BLOOD: Free T4: 1.16 ng/dL (ref 0.93–1.70)

## 2022-07-03 LAB — GLYCOSYLATED HGB(A1C), BLOOD: Glyco Hgb (A1C): 6.4 % — ABNORMAL HIGH (ref 4.8–5.8)

## 2022-07-08 ENCOUNTER — Encounter (HOSPITAL_BASED_OUTPATIENT_CLINIC_OR_DEPARTMENT_OTHER): Payer: Self-pay

## 2022-07-09 ENCOUNTER — Ambulatory Visit (INDEPENDENT_AMBULATORY_CARE_PROVIDER_SITE_OTHER): Payer: Medicare Other

## 2022-07-09 ENCOUNTER — Encounter (INDEPENDENT_AMBULATORY_CARE_PROVIDER_SITE_OTHER): Payer: Self-pay

## 2022-07-09 VITALS — BP 138/80 | HR 82 | Temp 96.5°F | Resp 18 | Ht 63.0 in | Wt 141.0 lb

## 2022-07-09 DIAGNOSIS — E119 Type 2 diabetes mellitus without complications: Secondary | ICD-10-CM

## 2022-07-09 DIAGNOSIS — E785 Hyperlipidemia, unspecified: Secondary | ICD-10-CM

## 2022-07-09 DIAGNOSIS — Z23 Encounter for immunization: Secondary | ICD-10-CM

## 2022-07-09 DIAGNOSIS — F39 Unspecified mood [affective] disorder: Secondary | ICD-10-CM

## 2022-07-09 DIAGNOSIS — E1169 Type 2 diabetes mellitus with other specified complication: Secondary | ICD-10-CM | POA: Insufficient documentation

## 2022-07-09 DIAGNOSIS — Z8679 Personal history of other diseases of the circulatory system: Secondary | ICD-10-CM

## 2022-07-09 DIAGNOSIS — K219 Gastro-esophageal reflux disease without esophagitis: Secondary | ICD-10-CM

## 2022-07-09 DIAGNOSIS — A310 Pulmonary mycobacterial infection: Secondary | ICD-10-CM

## 2022-07-09 DIAGNOSIS — I671 Cerebral aneurysm, nonruptured: Secondary | ICD-10-CM | POA: Insufficient documentation

## 2022-07-09 DIAGNOSIS — R7989 Other specified abnormal findings of blood chemistry: Secondary | ICD-10-CM

## 2022-07-09 DIAGNOSIS — F3340 Major depressive disorder, recurrent, in remission, unspecified: Secondary | ICD-10-CM | POA: Insufficient documentation

## 2022-07-09 DIAGNOSIS — Z932 Ileostomy status: Secondary | ICD-10-CM

## 2022-07-09 MED ORDER — METFORMIN HCL 500 MG OR TABS
ORAL_TABLET | ORAL | Status: DC
Start: 2022-07-09 — End: 2022-09-03

## 2022-07-09 MED ORDER — ATORVASTATIN CALCIUM 20 MG OR TABS
20.0000 mg | ORAL_TABLET | Freq: Every day | ORAL | 1 refills | Status: AC
Start: 2022-07-09 — End: ?

## 2022-07-09 MED ORDER — OMEPRAZOLE 20 MG OR CPDR
20.0000 mg | DELAYED_RELEASE_CAPSULE | Freq: Every day | ORAL | 3 refills | Status: AC
Start: 2022-07-09 — End: ?

## 2022-07-09 NOTE — Patient Instructions (Addendum)
Here is what we discussed today:  - Repeat blood tests are ordered - this is nonurgent, you can have this drawn with your next set of labs in 3 months  - Encourage ongoing hydration due to your watery bowel movements  - Diabetic foot exam updated today and is normal  - Obtain your tetanus vaccine and shingles vaccine at your local pharmacy  - Increase Atorvastatin to 20 mg   - Schedule brain MRI  - Schedule a follow-up appointment with me at the front desk in 2 months around the time of your next surgery

## 2022-07-09 NOTE — Progress Notes (Addendum)
[facility] Internal Medicine  Name: Tiffany Chen   DOB: Jun 14, 1945    MRN: [id number]  Date of Service: 07/09/2022     Reason for Visit:     HPI:   Alysia Morgese is a 77 year old female with PMHx of bronchiectasis, cavitary MAC lung infection, ruptured diverticulitis S/P colostomy in 2009, diabetes, depression, and ICH S/P embolization, who presents for follow-up.    Last office visit 05/03/2022 due to c/f elevated blood pressures and fatigue.     Elevated creatinine  - Cr 0.98 on 07/03/2022 (prior baseline ~0.7 - 0.8)  - Trying to drink 64 oz water per day, but not quite meeting goal    T2DM  - Felt fatigued after surgery in 03/2022, attributes to elevated blood sugars at that time (300s)  - Regimen: Metformin increased to 500 mg in AM and 1,000 mg in PM  - A1c 6.4% in 06/2022  - Home blood sugar: Elevated since prior hospitalization, now has monitor. Ranges 140s  - Planned for upcoming surgery in 2 months for colostomy reversal, however expresses worry about elevated blood sugars possibly contributing to fatigue and significant prolonged recovery, similar to 03/2022     Microvascular complications:  - Diabetic Retinopathy: Last eye exam - 01/2022  - Diabetic nephropathy: Last urine micro albumin - 12/2021  - Diabetic neuropathy: Last monofilament - 04/2021, will update today     Macrovascular complications:  - Peripheral vascular disease: N/A  - Coronary artery disease: N/A - Takes Ramipril, Atorvastatin  - Cerebrovascular events: N/A     Hyperlipidemia  - LDL 85 in 04/2022  - Atorvastatin 10 mg qday    GERD  - Omeprazole 20 mg qday  - Request refill    Ventral hernia  Ruptured diverticulitis S/P colostomy  - 03/25/2022: Underwent open colostomy takedown with low anterior resection and colorectal anastomosis, open splenic flexure mobilization, creation of diverting loop ileostomy, lysis of adhesions, flexible sigmoidoscopy, intraoperative bowel perfusion assessment   - Emptying bag more frequently, more liquid  bowel movements  - Plan for complete reversal in 09/2022    Cavitary MAC lung infection  - Followed by pulmonology  - Completed course of antibiotics for MAC  - CT scan 05/20/2022:   - from a MAC infection standpoint: very reassuring. Stable compared to 06/22/2021 CT scan, which suggests lack of active infection.   - there is another issue: the enlarged lymph nodes. This has persisted. This should have improved with treatment of the infection but it has not.   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 Had 3 coils placed. Aneurysm identified incidentally after a fall. Per pt report, prior neurosurgeon recommended annual imaging for surveillance.     Depression/Anxiety  - Related to son/husband who have now passed  - Previously referred to [facility], however had significant issues with scheduling and declines therapy resources at this time  - On Fluoxetine 40 mg qday  - Mood currently stable  - No SI/HI    [ Not discussed ]   Urine incontinence  - Leakage problems x months  - Occurs with laughing, coughing, squeezing  - Previously referred to pelvic floor PT    Health Maintenance  Routine: CBC, CMP  Vaccines   - Influenza: N/A   - COVID: Due   -  Tdap/Td: Due - advised to obtain at local pharmacy   - Pneumococcal: Completed Pneumovax 23 09/2020   - Zoster: Due, advised to obtain at local pharmacy  Cancer Screening   - Colon: Last in 07/2012 - noted polyps, proctitis, tics. Recent flex sig.   - Breast: Last in 05/2021, no evidence of breast cancer - due, wants to defer for 1 year   - Cervical: Opts out due to age  Other Screening   - Diabetes: A1c 6.4% in 06/2022   - Lipid: LDL 85 in 04/2022   - HCV: Nonreactive 05/2021   - HIV/STI: N/A   - DEXA 05/2021 with osteopenia, lowest T-score -1.9 at lumbar spine; FRAX major  osteoporotic risk 10.8%, hip 2.1%    Patient Active Problem List:  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)    Mycobacterium avium complex (CMS-HCC)    s/p Robotic right upper lobectomy with en bloc lower and middle lobe wedge resection, middle lobe wedge resection    Status post Hartmann procedure (CMS-HCC)    Ventral hernia without obstruction or gangrene    Colostomy status (CMS-HCC)    Status post cataract extraction and insertion of intraocular lens of right eye    Status post cataract extraction and insertion of intraocular lens of left eye    Type 2 diabetes mellitus with other specified complication, unspecified whether long term insulin use (CMS-HCC)    Pseudophakia of both eyes    Posterior vitreous detachment    S/P repair of ventral hernia    Ileostomy in place (CMS-HCC)    Unspecified mood (affective) disorder (CMS-HCC)    Hyperlipidemia    Nonruptured cerebral aneurysm    Recurrent major depression in remission (CMS-HCC)       PMHx:  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)     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       PSHx:  Past Surgical History:   Procedure Laterality Date    BRONCHOSCOPY  12/2020    CESAREAN SECTION, LOW TRANSVERSE      COLOSTOMY      CRANIOTOMY      LUNG LOBECTOMY          Meds:  Cannot display prior to admission medications because the patient has not been admitted in this contact.     Current Outpatient Medications   Medication Sig  atorvastatin (LIPITOR) 20 MG tablet Take 1 tablet (20 mg) by mouth daily.    biotin 1000 MCG tablet Take 1 tablet (1,000 mcg) by mouth daily.    blood glucose meter PHARMACY: Please dispense insurance approved bluetooth enable meter.    ferrous sulfate 325 (65 Fe) MG tablet Take 1 tablet (325 mg) by mouth daily.    fluoxetine (PROZAC) 40 MG capsule Take 1 capsule (40 mg) by mouth daily.    glucose blood test strip 1 strip by Other route every morning (before breakfast).    glucose blood test strip 1 strip by Other route 2 times daily (before meals).    lancets 1 each by Other route every morning (before breakfast).    lancets 1 each by Other route 2 times daily (before meals).    metFORMIN (GLUCOPHAGE) 500 mg tablet 500 mg q AM and 1000mg  q PM    Multiple Vitamin (MULTIVITAMIN) TABS tablet Take 1 tablet by mouth daily.    omeprazole (PRILOSEC) 20 MG capsule Take 1 capsule (20 mg) by mouth every morning (before breakfast).    psyllium (METAMUCIL) WAFR Take 2 Wafers by mouth every 12 hours as needed (As needed to maintain ileostomy output <1.2L).    ramipril (ALTACE) 2.5 MG capsule Take 1 capsule (2.5 mg) by mouth daily.    sodium chloride 7 % NEBU USE 1 VIAL VIA NEBULIZER  EVERY 12 HOURS    umeclidinium-vilanterol (ANORO ELLIPTA) 62.5-25 MCG/ACT inhaler Inhale 1  puff by mouth daily.    VENTOLIN HFA 108 (90 Base) MCG/ACT inhaler USE 2 INHALATIONS BY MOUTH  EVERY 6 HOURS AS NEEDED FOR WHEEZING    vitamin D3 125 MCG (5000 UT) capsule Take 1 capsule (5,000 Units) by mouth every 24 hours.    vitamin E 400 UNIT capsule Take 1 capsule (400 Units) by mouth daily.     No current facility-administered medications for this visit.       Allergies:  Allergies   Allergen Reactions    Walnuts Swelling     Tongue swollen and burn       Social Hx:  She reports that she has never smoked. She has never used smokeless tobacco.  She reports current alcohol use.  She reports that she does not currently use drugs.    Family Hx:  Family History   Problem Relation Name Age of Onset    Cancer Mother Denton Lank ngs and throat    Heart Disease Mother Joya Martyr         Congrestive heart failure due to cancer    Hypertension Mother Joya Martyr     Psychiatry Mother Joya Martyr         Bi polar, manic depressive    Cancer Father Jamal Maes         Prostate    Diabetes Father Jamal Maes     Stroke Father Jamal Maes     Stroke M Grandmother Winifred Larita Fife     Cancer Brother Vallarie Mare         Skin (many locations)    Diabetes Brother Vallarie Mare        ROS:  Review of Systems   Constitutional:  Negative for chills, fatigue and fever.   HENT:  Negative for congestion.    Respiratory:  Negative for cough and shortness of breath.    Cardiovascular:  Negative for chest pain.   Gastrointestinal:  Negative for abdominal pain, nausea  and vomiting.       OBJECTIVE:  BP 138/80 (BP Location: Left arm)   Pulse 82   Temp 96.5 F (35.8 C) (Temporal)   Resp 18   Ht 5\' 3"  (1.6 m)   Wt 64 kg (141 lb)   SpO2 97%   BMI 24.98 kg/m     Physical Exam  Constitutional:       General: She is not in acute distress.  HENT:      Head: Normocephalic and atraumatic.   Eyes:      Extraocular Movements: Extraocular movements intact.   Cardiovascular:      Rate and Rhythm: Normal rate and regular rhythm.   Pulmonary:       Effort: Pulmonary effort is normal.      Breath sounds: Normal breath sounds.   Abdominal:      General: Bowel sounds are normal.      Palpations: Abdomen is soft.      Tenderness: There is no abdominal tenderness. There is no guarding or rebound.   Musculoskeletal:         General: No swelling or tenderness.      Cervical back: Normal range of motion and neck supple.   Skin:     General: Skin is warm and dry.      Findings: No rash.   Neurological:      General: No focal deficit present.      Mental Status: She is alert. Mental status is at baseline.     DM FOOT EXAM: normal DP and PT pulses, no trophic changes or ulcerative lesions, normal sensory exam, and normal monofilament exam      Labs  Lab Results   Component Value Date    NA 139 07/03/2022    K 4.4 07/03/2022    CL 102 07/03/2022    BICARB 26 07/03/2022    BUN 16 07/03/2022    CREAT 0.98 (H) 07/03/2022    GLU 169 (H) 07/03/2022    [place] 9.7 07/03/2022     Lab Results   Component Value Date    WBC 5.3 07/03/2022    RBC 4.21 07/03/2022    HGB 12.8 07/03/2022    HCT 38.0 07/03/2022    MCV 90.3 07/03/2022    MCHC 33.7 07/03/2022    RDW 13.8 07/03/2022    PLT 240 07/03/2022    MPV 10.4 07/03/2022     Lab Results   Component Value Date    AST 29 07/03/2022    ALT 28 07/03/2022    ALK 64 07/03/2022    TP 6.7 07/03/2022    ALB 4.2 07/03/2022    TBILI 0.36 07/03/2022    DBILI <0.2 07/17/2020     Lab Results   Component Value Date    A1C 6.4 (H) 07/03/2022    A1C 6.7 (H) 05/06/2022    A1C 6.3 (H) 03/28/2022       Imaging/Studies    ASSESSMENT AND PLAN:    Tiffany Chen is a 77 year old female with PMHx of bronchiectasis, cavitary MAC lung infection, ruptured diverticulitis S/P colostomy in 2009, diabetes, depression, and ICH S/P embolization, who presents for follow-up.    Diagnoses and all orders for this visit:    Type 2 diabetes mellitus without complication, without long-term current use of insulin (CMS-HCC)  - Regimen: Metformin 500 mg in AM and 1,000 mg in  PM  - A1c 6.4% in 06/2022  A1c currently within goal range. Discussed with  patient that fatigue following surgery is likely multifactorial (dehydration, pain, deconditioning, poor lung reserve iso known MAC infections), rather than solely attributable to hyperglycemia. Will follow-up closely in 2 months following next surgery to address needs at that time.  Microvascular complications:    Diabetic Retinopathy: Last eye exam - 01/2022    Diabetic nephropathy: Last urine micro albumin - 12/2021    Diabetic neuropathy: Last monofilament - 07/09/2022, completed today    Hyperlipidemia associated with type 2 diabetes mellitus (CMS-HCC)  Will increase Atorvastatin 10 mg --> 20 mg qday for goal LDL < 70.  -     atorvastatin (LIPITOR) 20 MG tablet; Take 1 tablet (20 mg) by mouth daily.  -     Liver Panel, Blood Green Plasma Separator Tube; Future  -     Lipid Panel Green Plasma Separator Tube; Future    Elevated serum creatinine  Remains stable near baseline, though likely mildly elevated 2/2 dehydration given ongoing loose stools from ileostomy.  -     Basic Metabolic Panel, Blood Green Plasma Separator Tube; Future    Gastroesophageal reflux disease, unspecified whether esophagitis present  -     omeprazole (PRILOSEC) 20 MG capsule; Take 1 capsule (20 mg) by mouth every morning (before breakfast).    Ileostomy in place (CMS-HCC)  03/25/2022 Underwent open colostomy takedown with low anterior resection and colorectal anastomosis, creation of diverting loop ileostomy.         -     Plan for ostomy reversal 09/2022    Mycobacterium avium complex (CMS-HCC)  Hx history of recurrent infections and multiple antibiotic courses since 2018. Underwent right robotic upper lobectomy with middle and lower lobe wedge resection on 03/20/2021. 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 Currently off antibiotics. Pending PET CT scan per pulm for evaluation of enlarged lymph nodes.    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 Had 3 coils placed. Aneurysm identified incidentally after a fall. Per pt report, prior neurosurgeon recommended annual imaging for surveillance.   -     eConsult Neurology - will ask timeframe and preferred modality of routine screening  -     MR Angiogram Brain W/WO Contrast; Future    Unspecified mood (affective) disorder (CMS-HCC)  PHQ score 7 today. Mood currently stable on Fluoxetine (Prozac), will continue current regimen. Declines IBH referral.         -     Continue Fluoxetine 40 mg qday    Need for vaccination  -     COVID-19 (PFIZER) Vaccine 12+ Years IM, 2023-24         -     Reminded to schedule shingles and tdap vaccines at local pharmacy    Patient seen and examined with attending physician, Dr. Delena Bali.    Delight Ovens, MD  Internal Medicine  Resident Physician    Patient Instructions   Here is what we discussed today:  - Repeat blood tests are ordered - this is nonurgent, you can have this drawn with your next set of labs in 3 months  - Encourage ongoing hydration due to your watery bowel movements  - Diabetic foot exam updated today and is normal  - Obtain your tetanus vaccine and shingles vaccine at your local pharmacy  - Increase Atorvastatin to 20 mg   - Schedule brain MRI  - Schedule a follow-up appointment with  me at the front desk in 2 months around the time of your next surgery      ADDENDUM 09/05/2022 - correction to above documentation:  Patient has a history of perforated diverticulitis requiring open surgery for partial colectomy and colostomy formation in 2009. In 03/2022, she underwent hernia surgery with colostomy takedown, colorectal anastomosis, and creation of an ileostomy. She no longer has a colostomy. She is scheduled for ileostomy takedown in 11/2022.

## 2022-07-09 NOTE — Progress Notes (Deleted)
PULMONARY OUTPATIENT CLINIC FOLLOW-UP NOTE    No chief complaint on file.      History of Present Illness:  Tiffany Chen is a 77 year old female with history of bronchiectasis, cavitary MAC lung infection, ruptured diverticulitis s/p colostomy in 2009, DM, ICH s/p embolization. She is here to follow up on bronchiectasis and MAC lung infection.      Patient's last visit on 04/17/2022 with Dr. Fransico Michael to follow up on bronchiectasis and MAC lung infection. I saw Tiffany Chen the last on 10/16/2021 to follow up on bronchiectasis and MAC lung infection. She states doing well from a respiratory stand point since last visit. She denies ED visits or recent hospitalizations.       Tiffany Chen has history of MAC lung infection treated multiple times with azithromycin 500mg , Rifampin 600mg  , ethambutol 1600mg  three times weekly: 2018- 01/19/2018, 05/25/2018- 10/22/2019 and 12/22/2019- 02/16/2020.     As per last note from Dr. Fransico Michael:   In late 2021, CT scan showed enlarging cavity. December 2021 a bronch was performed but no MAC growth. Her doctor restarted azithro/Rifampin/ethmabutol based on imaging.   Feb 2022 she saw Dr. Johnnette Barrios who stopped the antibiotics based on the lack of microbiologic data. Performed repeat bronch 04/28/2020 which has smear AFB 3+ and culture MAC (drug susc pending).      07/25/2019 - she was strongly recommended to start new round of MAC antibiotic therapy due to the severity of the lung cavitation and severity of her symptoms. The regimen recommended was IV antibiotic (Amikacin) home infusion for 3 months, azithromycin 250 mg daily, ethambutol 800 mg daily and rifampin 600 mg daily.       08/14/2020 - She started on MAC antibiotic therapy with tentative plan to stop IV amikacin on 11/24/2020 and replaced with arikayce. Her IV was removed on 11/24/2020 and also discussed the possibility of right upper lobe lobectomy.    11/23/2020 - she started on Arikayce nebs every day. She reports  mild sore throat with Arykace but was able to tolerate with hot tea.       01/17/2021 - she was added clofazimine to her regimen.  In addition to azithro/rifampin/ethambutol and Arikayce. She also was referred to Dr. Chestine Spore for consideration for right upper and middle lobectomy. She was scheduled for RUL lobectomy on 03/20/2021.      02/21/2021 _ She was added clofazimine.      03/20/2021 -  She have had Right Robotic upper lobectomy with middle and lower  lobe wedge resection.      Her current regimen included: azithromycin, rifampin, ethambutol, clofazimine (which was started 02/21/2021) and inhaled Arikayce.     While on therapy she was advised to have lab work twice a week while on IV antibiotic and then after every month when off from IV antibiotic, continue with Audiogram every 3-4 months, submit sputum samples x 3 every month, EKG every 3-4 months and continue with ishihara discrimination color test.     She was started on pulmonary rehab but decided to do it independently/     Tiffany Chen states been somehow overwhelm after lobectomy and has forgot to due test required for MAC treatment.     She currently denies any side effects with current MAC antibiotics. She reports tolerating well MAC antibiotic therapy.  Overall, she feels better since started on MAC antibiotics.      She will have cataract surgery on 11/2009 and 2016.      Today, [name] states feeling well at her baseline. She reports symptom improvement with current MAC antibiotic therapy. She reports increased cough after her lobectomy surgery, however her cough has been non productive and not been able to provide a sample for culture. Currently denies cough.  She continues taking MAC antibiotics religiously. She has been doing air way clearance once a day with saline solution twice a day. She denies wheezing or dyspnea. She denies hemoptysis, fevers, chills, nausea, vomiting or night sweats.  She feels warm at night. She is currently capable to  walk more several miles without feeling winded. Her appetite had improved and she noticed some weight gain ( 7 pounds since last visit).  She denies feeling winded when going uphill or on stairs.  She denies chest congestion, orthopnea, PND lower extremity edema. She reports history of palpitations. She denies allergic rhinitis, sinus problems or other URI. She has history of GERD controlled with diet and PPI. She have had influenza vaccine on 10/2021 and competed COVID vaccines with 3 booster. .     Lab work from 07/24/2021 within normal limits( negative for thrombocytopenia, kidney or liver toxicity). EKG from 06/21/2021 with sinus rhythm ( due). She have had audiogram with stable hearing on 09/04/2021 and was recommended to switch to audiogram every 3 month.     Respiratory culture and AFB from 08/31/2021 and 08/28/2021 with preliminary negative results. Last positive AFB culture from 02/15/2021.     Review of Systems:   A full  review of systems was performed and the pertinent positives and negatives were mentioned in the HPI, all other review of systems were negative.     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)     s/p Robotic right upper lobectomy with en bloc lower and middle lobe wedge resection, middle lobe wedge resection  03/20/2021    03/20/21 Robotic right upper lobectomy with en bloc lower and middle lobe wedge resection, middle lobe wedge resection ([facility])       Past Surgical History:   Procedure Laterality Date    BRONCHOSCOPY  12/2020    CESAREAN SECTION, LOW TRANSVERSE      COLOSTOMY      CRANIOTOMY      LUNG LOBECTOMY         Family History   Problem Relation Name Age of Onset    Cancer Mother Denton Lank ngs and throat    Heart Disease Mother Joya Martyr         Congrestive heart failure due to cancer    Hypertension Mother Joya Martyr     Psychiatry Mother Joya Martyr         Bi polar, manic depressive    Cancer Father Jamal Maes         Prostate    Diabetes Father Jamal Maes  Stroke Father Jamal Maes     Stroke M Grandmother Winifred Larita Fife     Cancer Brother Vallarie Mare         Skin (many locations)    Diabetes Brother Vallarie Mare        Social History     Socioeconomic History    Marital status: Widowed   Tobacco Use    Smoking status: Never    Smokeless tobacco: Never   Vaping Use    Vaping status: Never Used   Substance and Sexual Activity    Alcohol use: Yes     Comment: glass of wine every other month    Drug use: Not Currently    Sexual activity: Not Currently   Other Topics Concern    Military Service No    Blood Transfusions No    Caffeine Concern No    Occupational Exposure No    Hobby Hazards Yes     Comment: Gardening, MAC    Sleep Concern No    Stress Concern Yes     Comment: Husband death Cov52, 07-15-2018, sold home, rehomed pets. [place]    Weight Concern No    Special Diet Yes    Back Care No    Exercises Regularly Yes    Bike Helmet Use No    Seat Belt Use No    Performs Self-Exams No   Social History Narrative    Lives with brother Fayrene Fearing at home. Sister also lives in [facility]     Social Determinants of Health     Food Insecurity: No Food Insecurity (07/09/2022)    Hunger Vital Sign     Worried About Running Out of Food in the Last Year: Never true     Ran Out of Food in the Last Year: Never true    Transportation Needs: No Transportation Needs (07/09/2022)    PRAPARE - Therapist, art (Medical): No     Lack of Transportation (Non-Medical): No   Intimate Partner Violence: Low Risk  (03/25/2022)    [place] IPV     Have you ever been emotionally or physically abused by your partner or someone important to you?: No     Within the last year have you been hit, slapped, kicked or otherwise physically hurt by someone?: No     Within the last year has anyone forced you to have sexual activities?: No     Are you afraid of your spouse or partner you listed above?: No   Housing Stability: Unknown (07/09/2022)    Housing Stability Vital Sign     Unable to Pay for Housing in the Last Year: No     Unstable Housing in the Last Year: No       Allergies   Allergen Reactions    Walnuts Swelling     Tongue swollen and burn       Current Outpatient Medications on File Prior to Visit   Medication Sig Dispense Refill    atorvastatin (LIPITOR) 10 MG tablet Take 1 tablet (10 mg) by mouth daily. 90 tablet 2    biotin 1000 MCG tablet Take 1 tablet (1,000 mcg) by mouth daily.      blood glucose meter PHARMACY: Please dispense insurance approved bluetooth enable meter. 1 each 0    ferrous sulfate 325 (65 Fe) MG tablet Take 1 tablet (325 mg) by mouth daily.      fluoxetine (PROZAC) 40 MG capsule Take 1 capsule (40 mg)  by mouth daily. 90 capsule 3    glucose blood test strip 1 strip by Other route every morning (before breakfast). 100 strip 2    glucose blood test strip 1 strip by Other route 2 times daily (before meals). 100 strip 11    lancets 1 each by Other route every morning (before breakfast). 100 each 2    lancets 1 each by Other route 2 times daily (before meals). 100 Lancet. 5    metFORMIN (GLUCOPHAGE) 500 mg tablet Take 1 tablet (500 mg) by mouth 2 times daily (with meals). 180 tablet 1    Multiple Vitamin (MULTIVITAMIN) TABS tablet Take 1 tablet by mouth daily.      omeprazole (PRILOSEC) 20 MG capsule Take 1  capsule (20 mg) by mouth every morning (before breakfast). 90 capsule 3    [DISCONTINUED] omeprazole (PRILOSEC) 20 MG capsule TAKE 1 CAPSULE BY MOUTH IN  THE MORNING BEFORE  BREAKFAST 90 capsule 3    psyllium (METAMUCIL) WAFR Take 2 Wafers by mouth every 12 hours as needed (As needed to maintain ileostomy output <1.2L). 24 packet 0    ramipril (ALTACE) 2.5 MG capsule Take 1 capsule (2.5 mg) by mouth daily. 90 capsule 1    sodium chloride 7 % NEBU USE 1 VIAL VIA NEBULIZER  EVERY 12 HOURS 720 mL 3    umeclidinium-vilanterol (ANORO ELLIPTA) 62.5-25 MCG/ACT inhaler Inhale 1 puff by mouth daily. 60 each 11    VENTOLIN HFA 108 (90 Base) MCG/ACT inhaler USE 2 INHALATIONS BY MOUTH  EVERY 6 HOURS AS NEEDED FOR WHEEZING 72 g 3    vitamin D3 125 MCG (5000 UT) capsule Take 1 capsule (5,000 Units) by mouth every 24 hours.      vitamin E 400 UNIT capsule Take 1 capsule (400 Units) by mouth daily.       No current facility-administered medications on file prior to visit.       Physical Examination:  There were no vitals taken for this visit.           General: well appearing alert and oriented x 3, in nad.  Oropharynx: without any lesions  Heart:  Regular rate and rhythm, normal S1 S2, no murmurs.  Lungs: clear to auscultation, right upper lobe wheezing, rales, rhonchi, no chest deformities noted. Normal I:E ratio  Extremities:  no cyanosis, clubbing, or edema.  Skin:  No rashes or lesions.  Psych: normal affect and mood       I have reviewed the following  laboratory data and other diagnostic studies:   CBC:  Lab Results   Component Value Date    WBC 5.3 07/03/2022    HGB 12.8 07/03/2022    HCT 38.0 07/03/2022    PLT 240 07/03/2022     CHEM:  Lab Results   Component Value Date    NA 139 07/03/2022    K 4.4 07/03/2022    CL 102 07/03/2022    BICARB 26 07/03/2022    BUN 16 07/03/2022    CREAT 0.98 (H) 07/03/2022    GLU 169 (H) 07/03/2022    [place] 9.7 07/03/2022     COAG:  No results found for: "PT", "PTT", "INR"  LFTs:  Lab Results    Component Value Date    AST 29 07/03/2022    ALT 28 07/03/2022    ALK 64 07/03/2022    TP 6.7 07/03/2022    ALB 4.2 07/03/2022    TBILI 0.36 07/03/2022    DBILI <0.2  07/17/2020          Review of Radiology Studies:    Pulmonary Function Test Review:  04/28/2020 (care everywhere): noraml spito, normal volumes, mildly reduced DLCO     Radiology Review:    Chest x ray (03/31/2022):  FINDINGS:     Lines and Tubes: None  Mediastinum: The cardiomediastinal silhouette is unchanged.  Lungs: Bronchial wall thickening. Right upper lobectomy. Bronchial wall thickening..  Pleura: Loculated right apical pleural effusion unchanged.  Bones and soft tissues: Unchanged    IMPRESSION:  No definite acute findings. Bronchial wall thickening which can be seen with large airways disease.    Chest x ray (03/20/2021):  FINDINGS:  Lines and Tubes: Large-bore right-sided chest tube seen in place with some subcutaneous emphysema  Mediastinum: The cardiomediastinal silhouette is within normal limits. No lymphadenopathy is appreciated.  Lungs: The lungs are clear. Status post right upper lobe lobectomy with re-expansion of the lung tissue.  Pleura: No pneumothorax or effusion.  Bones and soft tissues: Unchanged    IMPRESSION:  Right-sided large-bore chest tube with no evidence of pneumothorax. Status post right upper lobectomy      CT chest (05/31/2022):     FINDINGS:     LINES AND TUBES: None.     THYROID AND THORACIC INLET: Unremarkable.     LYMPH NODES: Waxing and waning moderate mediastinal and hilar lymphadenopathy, slightly increased.     CARDIOVASCULAR: Unchanged aortic and coronary calcification.     PERICARDIUM: Unremarkable.     ESOPHAGUS: Unremarkable.     LUNG: Postoperative changes in the right hemithorax again noted, with expected evolution. Multifocal cylindrical bronchiectasis with associated clustered tree-in-bud nodularity, not substantially changed compared to the last study. Patent central airways.     PLEURA:  Unremarkable.     ABDOMEN: Unchanged upper abdomen.     BONE AND SOFT TISSUE: Mild disc degeneration. No suspicious bony lesions. Diffuse osteopenia, as before.    IMPRESSION:  Nonspecific, waxing and waning moderate mediastinal or hilar lymphadenopathy. CT follow-up in 6 months is suggested for re-evaluation. If lymphadenopathy worsens at that time, consider workup for underlying hematological malignancy.     Expected evolution of postoperative changes in the right hemithorax.     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    CT chest (06/26/2021):  FINDINGS:  Lines and Tubes: None   Thyroid and thoracic inlet: Normal.   Lymph nodes: There is a left lower cervical lymphadenopathy measuring up to 1.1 cm in short axis diameter, previously 9 mm.  There is left upper paratracheal mediastinal lymphadenopathy measuring up to 1.4 cm in short axis diameter, previously 1 cm.  There is periaortic lymphadenopathy measuring up to 1 cm in short axis diameter, previously 9 mm.  There is subaortic lymphadenopathy measuring up to 1.4 cm in short axis diameter, previously 1.1 cm.  A right subcarinal lymph node measures 1.4 cm in short axis diameter, previously 7 mm.  Left subcarinal lymph nodes measure approximately 1.2 cm in short axis diameter, previously 1.1 cm.  Mediastinal lymph nodes are not well assessed in the absence of intravenous contrast.  Cardiovascular: Heart size is normal.  Mild coronary artery calcification is present the thoracic aorta pulmonary artery are normal in caliber.  Pericardium: There is a trace pericardial effusion, similar to the prior.  Esophagus: Normal.  Lung: Patient is status post interval right upper lobectomy.  There are scattered tree-in-bud opacities present within the lungs similar to slightly improved  in comparison to the prior study.  Previously seen scattered patchy groundglass opacities appear improved.  Pleura: There is a  small residual right hydropneumothorax.  Abdomen: Please see the concurrently performed CT abdomen pelvis report for findings below the diaphragm.  Bone and soft tissue: No acute abnormality.    IMPRESSION:  1. Post interval right upper lobectomy with a small residual right hydropneumothorax.      2. Scattered tree-in-bud airspace opacities throughout the lungs, similar to slightly improved.  Previously seen patchy groundglass opacities within the lungs appear improved.    3. Mediastinal lymphadenopathy.  This appears slightly increased in comparison to the prior study.     Ct chest (01/19/2021):  FINDINGS:  Lines and Tubes: None     Thyroid and thoracic inlet: Normal.     Lymph nodes: 11 mm AP window lymph node measured 7 mm on the prior study. Pre-vascular 7 mm lymph node measured 4 mm on the prior study. Subcarinal lymphadenopathy may be slightly increased.     Cardiovascular: Atherosclerotic disease.     Pericardium: Small amount of pericardial fluid is unchanged     Esophagus: Normal.     Lung: 3.4 cm cavitary mass in the posterior segment of the right lower lobe measured 3 cm on the prior study. Increasing ground-glass opacity throughout the anterior and apical segments of the right upper lobe. Increasing consolidation at the right apex. Increasing nodularity and bronchial wall thickening in the right upper lobe. Wall thickness of a cavity in the right upper lobe on series 4, image 334 measured 9 mm. This measured 3 mm on the prior study. Increasing ground-glass opacity in the lower lobes. Increasing tree-in-bud nodularity. Previously seen nodularity persists.     Pleura: Normal.     Abdomen: Visualized superior abdomen demonstrates hepatic hypodensity which is unchanged     Bone and soft tissue: No acute abnormality.    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 Additionally,  ground-glass opacity and nodularity has increased.      6 MIN WALK (01/30/2021):      PFT (08/17/2021):      PFTs ( 02/16/2021):      ASSESSMENT AND PLAN:  Tiffany Chen is a 77 year old female with history of bronchiectasis, cavitary MAC lung infection, ruptured diverticulitis s/p colostomy in 2009, DM, ICH s/p embolization. She is here to follow up on bronchiectasis and MAC lung infection.     # Bronchiectasis/ Cavitary MAC lung infection: currently stable and asymptomatic. She was advised to start MAC antibiotic therapy due to the severity of her lesions and symptoms. She started on  IV antibiotic Amikacin home infusion for 3 months (08/14/2020 to 11/24/2020), azithromycin 250 mg daily, ethambutol 800 mg daily and rifampin 600 mg daily.  She was started on inhaled Arikayce on 11/23/2020.  Clofazimine was added on 1/11/20223. She have had right Robotic upper lobectomy with middle and lower  lobe wedge resection on 03/20/2021. She reports improved condition with current MAC antibiotic therapy. She reports increased non productive cough since her lobectomy surgery and no other symptom. She has been adherent to MAC antibiotics and air way clearance. Her current regimen included: azithromycin 250 mg daily, rifampin 600 mg daily, ethambutol 800 mg daily, clofazimine 100 mg daily (which was started 02/21/2021) and daily inhaled Arikayce. She reports exertional dyspnea but no other symptoms. She reports improved pulmonary condition since started on MAC antibiotics. PFT from  08/11/202 wnl.[name] PFT from 02/16/2021 with ( FVC 2.00- 79%, FEV 1 1.60 - 85%, FEV1/FVC 80, DLCOc 80%).  CT chest from 06/2021 with improved findings compared to previous CT chest. CT Chest from 04/13/2020 with micronodular and tree in bud opacity consistent with MAC infection. Cavitary lesion to RUL about 2.7 cm. CT chest from 11/09/2020 with worsening of nontuberculous mycobacterial disease infection involving the right lung (new thick walled  cavitary 2.6 cm right apical nodule on series 2, image 393 adjacent 12 mm thick walled cavitary nodule is new. Worsening tree-in-bud nodularity and bronchiectasis in the right upper lobe). Ct chest from 01/19/2021 with worsenig cavitary nontuberculous mycobacterial disease has worsened with increasing size of cavitary lesions with associated increasing wall thickness. Sputum cultures from 08/2021 x 2 with negatige preliminary AFB. I have discussed air way clearance and MAC antibiotic therapy. I have recommended her to continue with air way clearance once a day with saline nebulizer solution followed by acapella device . Patient was instructed to use albuterol inhaler 2 puffs before air way clearance and as needed for cough, wheezing or shortness of breath. I have recommended her continue with Arikayce daily, azithromycin 250 mg daily, ethambutol 800 mg daily, clofazimine 100 mg daily and rifampin 600 mg daily. She is overdue for lab work. EKG and Audiogram (11/2021). I have reminded her to continue with lab work every month, sputum cultures for AFB and respiratory cultures x 3 per month, EKG and Audiogram every 3-4 months  and to continue with ishihara discrimination color chart. Patient was advised to monitor for respiratory symptoms and call back if any changes. Reminded about pending PFT and continue submitting sputum samples for culture.  ED precautions given.     # Immunizations: up to date.     "I personally spend a total of 60 Minutes in face to face and non face to face activities related to patient's visit today, excluding and separately reportable services/procedures."    Follow up with Dr. Fransico Michael in 2 months.     Cameron Proud NP-BC  Pulmonary Medicine

## 2022-07-09 NOTE — Progress Notes (Signed)
Attending Note:    Subjective:  I reviewed the history.  Patient interviewed and examined.  History of present illness (HPI):  Follow Up (DM)    Patient is a 77 year old female here for fu of DM  Hx ruptured diverticulitis and ileostomy in place, cerebral aneurym s/p coiling    # DM2 - on metformin 500mg  qAM and 1046m q PM, latter increased a month ago  Home fasting sugars max 140s  A1c last week 6.4    #HLD assoc with Dm 2  The 10-year ASCVD risk score (Arnett DK, et al., 2019) is: 49%    Values used to calculate the score:      Age: 49 years      Sex: Female      Is Non-Hispanic African American: No      Diabetic: Yes      Tobacco smoker: No      Systolic Blood Pressure: 138 mmHg      Is BP treated: Yes      HDL Cholesterol: 61 mg/dL      Total Cholesterol: 171 mg/dL      Review of Systems (ROS): As per  the resident's note.  Past Medical, Family, Social History:  As per  the resident's  note.    Objective:   I have examined the patient and I concur with the resident's exam and of note is heart is RRR w/o murmur and lung is CTA.    Assessment and plan reviewed with the resident physician.  I agree with the resident's plan as documented.    See the resident's note for further details and updates on healthcare maintenance.

## 2022-07-15 ENCOUNTER — Encounter (INDEPENDENT_AMBULATORY_CARE_PROVIDER_SITE_OTHER): Payer: Self-pay

## 2022-07-16 ENCOUNTER — Ambulatory Visit: Payer: Medicare Other | Attending: Surgery | Admitting: Surgery

## 2022-07-16 ENCOUNTER — Ambulatory Visit (HOSPITAL_BASED_OUTPATIENT_CLINIC_OR_DEPARTMENT_OTHER): Payer: Medicare Other | Admitting: Nurse Practitioner

## 2022-07-16 ENCOUNTER — Other Ambulatory Visit (HOSPITAL_BASED_OUTPATIENT_CLINIC_OR_DEPARTMENT_OTHER): Payer: Medicare Other | Admitting: Neurology

## 2022-07-16 VITALS — BP 113/65 | HR 76 | Temp 97.1°F | Resp 16 | Wt 140.0 lb

## 2022-07-16 DIAGNOSIS — E119 Type 2 diabetes mellitus without complications: Secondary | ICD-10-CM | POA: Insufficient documentation

## 2022-07-16 DIAGNOSIS — Z932 Ileostomy status: Secondary | ICD-10-CM | POA: Insufficient documentation

## 2022-07-16 DIAGNOSIS — Z8679 Personal history of other diseases of the circulatory system: Secondary | ICD-10-CM

## 2022-07-16 NOTE — Progress Notes (Signed)
History and Physical     Referring MD:  Monia Sabal, MD  [address]  [place],  [place] [address]    History of Present Illness:     Tiffany Chen is a 77 year old female who is here for evaluation.  Patient with history of open Hartman procedure for perforated diverticulitis in 2009.  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  Colostomy functioning well and appliance seating well without leakage.  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 Chestine Spore in March 2023.  Patient recovered well.  Currently being evaluated by Dr. Orlin Hilding for paraesophageal hernia as possible cause of chronic reflux with micro aspirations    I have independently reviewed and interpreted the following imaging findings and agree with radiologist's interpretation except as stated below and discussed them with the patient.    CT Ch/A/P 06/22/21:  No leak at the [facility] pouch     Large peristomal hernia with a wide orifice    1. Post interval right upper lobectomy with a small residual right hydropneumothorax.      2. Scattered tree-in-bud airspace opacities throughout the lungs, similar to slightly improved.  Previously seen patchy groundglass opacities within the lungs appear improved.    3. Mediastinal lymphadenopathy.  This appears slightly increased in comparison to the prior study    Interval History and Exam on 04/16/22:  Patient s/p open colostomy takedown with low anterior resection and colorectal anastomosis, open splenic flexure mobilization, creation of diverting loop ileostomy, lysis of adhesions, flexible sigmoidoscopy, intraoperative bowel perfusion assessment on 03/25/22.  Patient tolerating diet.  Denies abdominal pain.  Ostomy with applesauce output, empyting 3-5 times per day.  On low fiber diet.  Occasional mucus passage via rectum. Denies signs/symptoms of  dehydration    Pathology:   A: Rectum, resection:   -Colonic tissue with focal mucin extravasation in the subserosa and serosal   adhesion.   -No dysplasia or malignancy identified.   -Viable resection margins.   B: Colon, resection with colostomy:   -Colocutaneous tissue with diverticulum and mild chronic inflammation.   -Pericolonic fibroadipose tissue with congestion and hemorrhage.   -Viable resection margins.   C: Omentum, resection:   -Benign fibroadipose tissue with congestion and hemorrhage.     Interval History and Exam on 07/16/22:  Patient s/p flex sig on 06/20/22.  Overall doing well.  Denies abdominal pain.  Tolerating diet.  Emptying ostomy 3-5 times per day.  Denies symptoms of dehydration    Flex Sig 06/20/22:  Flexible sigmoidoscopy was performed up  to approximately 60 cm from the anal verge, and the colonic and  rectal mucosa was visualized upon withdrawal.  A patent and intact end to side colorectal anastomosis was identified at 8 cm from the anal verge.  The scope was removed.      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)     s/p Robotic right upper lobectomy with en bloc lower and middle lobe wedge resection, middle lobe wedge resection 03/20/2021     03/20/21 Robotic right upper lobectomy with en bloc lower and middle lobe wedge resection, middle lobe wedge resection ([facility])     Patient denies history of: colorectal cancer, Crohn's disease and ulcerative colitis    Past Surgical History:   Procedure Laterality Date    BRONCHOSCOPY  12/2020    CESAREAN SECTION, LOW TRANSVERSE      COLOSTOMY      CRANIOTOMY      LUNG LOBECTOMY         Allergies   Allergen Reactions    Walnuts Swelling     Tongue swollen and burn     Current Outpatient Medications   Medication Sig    atorvastatin (LIPITOR) 20 MG tablet Take 1 tablet (20 mg) by mouth daily.    biotin 1000 MCG tablet Take 1 tablet (1,000 mcg) by mouth daily.    blood glucose meter PHARMACY: Please dispense insurance approved bluetooth enable meter.    ferrous sulfate 325 (65 Fe) MG tablet Take 1 tablet (325 mg) by mouth daily.    fluoxetine (PROZAC) 40 MG capsule Take 1 capsule (40 mg) by mouth daily.    glucose blood test strip 1 strip by Other route every morning (before breakfast).    glucose blood test strip 1 strip by Other route 2 times daily (before meals).    lancets 1 each by Other route every morning (before breakfast).    lancets 1 each by Other route 2 times daily (before meals).    metFORMIN (GLUCOPHAGE) 500 mg tablet 500 mg q AM and 1000mg  q PM    Multiple Vitamin (MULTIVITAMIN) TABS tablet Take 1 tablet by mouth daily.    omeprazole (PRILOSEC) 20 MG capsule Take 1 capsule (20 mg) by mouth every morning (before breakfast).    psyllium (METAMUCIL) WAFR Take 2 Wafers by mouth every 12 hours as needed (As needed to maintain ileostomy output <1.2L).    ramipril (ALTACE) 2.5 MG capsule Take 1 capsule (2.5 mg) by mouth daily.    sodium chloride 7 % NEBU USE 1 VIAL VIA NEBULIZER  EVERY 12 HOURS    umeclidinium-vilanterol (ANORO ELLIPTA) 62.5-25 MCG/ACT inhaler Inhale 1 puff by mouth daily.    VENTOLIN HFA 108 (90 Base) MCG/ACT inhaler USE 2 INHALATIONS BY MOUTH  EVERY 6 HOURS AS NEEDED FOR WHEEZING     vitamin D3 125 MCG (5000 UT) capsule Take 1 capsule (5,000 Units) by mouth every 24 hours.    vitamin E 400 UNIT capsule Take 1 capsule (400 Units) by mouth daily.     No current facility-administered medications for this visit.       Social History     Socioeconomic History    Marital status: Widowed   Tobacco Use    Smoking status: Never    Smokeless tobacco: Never   Vaping Use    Vaping status: Never Used   Substance and Sexual Activity    Alcohol use: Yes     Comment: glass of wine every other month    Drug use: Not Currently    Sexual activity: Not Currently  Social Activities of Daily Living Present    Military Service No    Blood Transfusions No    Caffeine Concern No    Occupational Exposure No    Hobby Hazards Yes     Comment: Gardening, MAC    Sleep Concern No    Stress Concern Yes     Comment: Husband death Cov47, 2018-03-01, sold home, rehomed pets. [place]    Weight Concern No    Special Diet Yes    Back Care No    Exercises Regularly Yes    Bike Helmet Use No    Seat Belt Use No    Performs Self-Exams No   Social History Narrative    Lives with brother Fayrene Fearing at home. Sister also lives in [place]     Family History   Problem Relation Name Age of Onset    Cancer Mother Denton Lank ngs and throat    Heart Disease Mother Joya Martyr         Congrestive heart failure due to cancer    Hypertension Mother Joya Martyr     Psychiatry Mother Joya Martyr         Bi polar, manic depressive    Cancer Father Jamal Maes         Prostate    Diabetes Father Jamal Maes     Stroke Father Jamal Maes     Stroke M Grandmother Winifred Larita Fife     Cancer Brother Vallarie Mare         Skin (many locations)    Diabetes Brother Vallarie Mare        Patient denies family history of: colorectal cancer, Crohn's disease and ulcerative colitis    Review of Systems: A complete review of systems was performed and is negative except as documented in HPI.    Physical Exam:  BP 113/65 (BP Location: Right arm, BP Patient Position: Sitting, BP  cuff size: Regular)   Pulse 76   Temp 97.1 F (36.2 C) (Temporal Artery)   Resp 16   Wt 63.5 kg (140 lb)   BMI 24.80 kg/m   General Appearance: healthy, alert, no distress, pleasant affect, cooperative, skin warm, dry, and pink.  Eyes:  Conjunctivae and corneas clear. EOM's intact. Fundi benign.  Mouth: Normal.  Neck:  Neck supple. No adenopathy.  Heart:  Normal rate and regular rhythm  Lungs: No increased work of breathing.  Abdomen:soft, non-tender, non-distended, well-healed midline scar, right sided ileostomy pink and patent   Extremities:  No cyanosis, clubbing, or edema.  Skin:  Negative.        Assessment and Care Plan:    ICD-10-CM ICD-9-CM    1. Ileostomy in place (CMS-HCC)  Z93.2 V44.2 X-Ray Colon Single Contrast with KUB      Place Patient on ERAS Protocol - Ambulatory      Mytonomy, Enhanced Recovery After Surgery (ERAS)      Mytonomy, Nose To Toes      Mytonomy, Anesthesia      Nursing Misc Order:      Case Request: Ileostomy takedown with small bowel resection and anastomosis      2. Type 2 diabetes mellitus without complication, unspecified whether long term insulin use (CMS-HCC)  E11.9 250.00 X-Ray Colon Single Contrast with KUB        Patient doing well postop.  Flexible sigmoidoscopy completed which showed a patent and intact colorectal anastomosis.  Will confirm integrity of  colorectal anastomosis with gastrografin enema followed by ileostomy takedown      The risks, benefits and alternatives were discussed with the patient.  The patient agreed to undergo a ileostomy takedown with small bowel resection and anastomosis.  The surgical risks included but not limited to surgical site infection, bleeding, intra-abdominal infection requiring percutaneous or surgical drainage, bowel obstruction, incisional hernia, anastomotic leak requiring percutaneous drainage or surgical re-operation and stoma creation, heart attack, stroke, death and anesthesia-related complications.        Plan for ileostomy  takedown with small bowel resection  Follow-up with Dr. Orlin Hilding to plan combined case    Note Author: Dorian Furnace, MD

## 2022-07-16 NOTE — Patient Instructions (Addendum)
Colorectal Surgery Discharge Instructions    [facility]: Colon and Rectal Surgery   [address], [facility] [address]  [place], [place] [address]  T: [phone]    F: [phone]    Provider: Dr. Tonita Phoenix      Plan of Care:  Follow up with Dr.Broderick.  Please call [phone] to schedule Xray with rectal contrast.   Your surgeon has determined that you are a good candidate for our Enhanced Recovery after Surgery (ERAS) Program. This program uses the best practices in surgical care.  It will help you recover and get home as quickly and as safely as possible after your surgery.  Please read this handout carefully to get the most out of the ERAS program  Please see blue folder for pre-operative instructions      Colorectal Surgery Appointment, Procedure Scheduling, and Nurse Triage Line: Phone [phone]    Questions/Concerns: For questions or concerns about today's appointment, you may contact us at  [phone].  Evenings/Weekends: If you have questions or concerns after hours, please call the hospital operator at [phone] and ask for the general surgery resident on call. He or she will be in communication with your doctor.   Emergencies: If you are experiencing a medical emergency, call 911 or go to the nearest emergency department and ask them to contact your provider at [phone].

## 2022-07-16 NOTE — Progress Notes (Signed)
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 Had 3 coils placed. Aneurysm identified incidentally after a fall. What are the routine guidelines for surveillance?       A:  MRA from 06/06/2021 shows no residual, meaning successful treatment of the aneurysm and no other lesion:      No need for further routine testing or screening. Recommend no smoking, BP under 130/80 at rest.

## 2022-07-18 ENCOUNTER — Ambulatory Visit (HOSPITAL_BASED_OUTPATIENT_CLINIC_OR_DEPARTMENT_OTHER): Payer: Medicare Other | Admitting: Colon & Rectal Surgery

## 2022-07-19 ENCOUNTER — Inpatient Hospital Stay (INDEPENDENT_AMBULATORY_CARE_PROVIDER_SITE_OTHER)
Admit: 2022-07-19 | Discharge: 2022-07-19 | Disposition: A | Discharge status: Home Health | Payer: Medicare Other | Attending: Internal Medicine | Admitting: Internal Medicine

## 2022-07-19 ENCOUNTER — Ambulatory Visit (HOSPITAL_BASED_OUTPATIENT_CLINIC_OR_DEPARTMENT_OTHER): Payer: Medicare Other

## 2022-07-19 DIAGNOSIS — Z8679 Personal history of other diseases of the circulatory system: Secondary | ICD-10-CM

## 2022-07-19 MED ORDER — GADOBUTROL 1 MMOL/ML IV SOLN (WRAPPED RECORD)
6.0000 mL | Freq: Once | INTRAVENOUS | Status: AC
Start: 2022-07-19 — End: 2022-07-19
  Administered 2022-07-19: 6 mL via INTRAVENOUS

## 2022-07-22 ENCOUNTER — Ambulatory Visit (HOSPITAL_BASED_OUTPATIENT_CLINIC_OR_DEPARTMENT_OTHER)
Admit: 2022-07-22 | Discharge: 2022-07-22 | Disposition: A | Discharge status: Home Health | Payer: Medicare Other | Attending: Critical Care Medicine | Admitting: Critical Care Medicine

## 2022-07-22 ENCOUNTER — Ambulatory Visit
Admit: 2022-07-22 | Discharge: 2022-07-22 | Disposition: A | Discharge status: Home Health | Payer: Medicare Other | Attending: Critical Care Medicine | Admitting: Critical Care Medicine

## 2022-07-22 DIAGNOSIS — A310 Pulmonary mycobacterial infection: Secondary | ICD-10-CM | POA: Insufficient documentation

## 2022-07-22 DIAGNOSIS — R590 Localized enlarged lymph nodes: Secondary | ICD-10-CM | POA: Insufficient documentation

## 2022-07-22 DIAGNOSIS — E119 Type 2 diabetes mellitus without complications: Secondary | ICD-10-CM | POA: Insufficient documentation

## 2022-07-22 DIAGNOSIS — J479 Bronchiectasis, uncomplicated: Secondary | ICD-10-CM | POA: Insufficient documentation

## 2022-07-22 LAB — GLUCOSE POCT, BLOOD: Glucose (POCT): 140 mg/dL — ABNORMAL HIGH (ref 70–99)

## 2022-07-22 MED ORDER — FLUDEOXYGLUCOSE F-18 IV KIT
10.6100 | PACK | Freq: Once | INTRAVENOUS | Status: AC
Start: 2022-07-22 — End: 2022-07-22
  Administered 2022-07-22: 10.61 via INTRAVENOUS
  Filled 2022-07-22: qty 10.61

## 2022-07-23 ENCOUNTER — Ambulatory Visit
Admission: RE | Admit: 2022-07-23 | Discharge: 2022-07-23 | Disposition: A | Discharge status: Home / Self Care | Payer: Medicare Other | Attending: Allergy | Admitting: Allergy

## 2022-07-23 VITALS — BP 125/53 | HR 65 | Temp 98.0°F | Resp 16 | Ht 63.0 in | Wt 141.1 lb

## 2022-07-23 DIAGNOSIS — D809 Immunodeficiency with predominantly antibody defects, unspecified: Secondary | ICD-10-CM | POA: Insufficient documentation

## 2022-07-23 MED ORDER — DIPHENHYDRAMINE HCL 25 MG OR TABS OR CAPS CUSTOM
25.0000 mg | ORAL_CAPSULE | Freq: Once | ORAL | Status: DC | PRN
Start: 2022-07-23 — End: 2022-07-23

## 2022-07-23 MED ORDER — ACETAMINOPHEN 325 MG PO TABS
650.0000 mg | ORAL_TABLET | Freq: Once | ORAL | Status: AC
Start: 2022-07-23 — End: 2022-07-23
  Administered 2022-07-23: 650 mg via ORAL
  Filled 2022-07-23: qty 2

## 2022-07-23 MED ORDER — DEXTROSE 5 % IV SOLN
INTRAVENOUS | Status: DC
Start: 2022-07-23 — End: 2022-07-23

## 2022-07-23 MED ORDER — CETIRIZINE HCL 10 MG OR TABS
10.0000 mg | ORAL_TABLET | Freq: Once | ORAL | Status: AC
Start: 2022-07-23 — End: 2022-07-23
  Administered 2022-07-23: 10 mg via ORAL
  Filled 2022-07-23: qty 1

## 2022-07-23 MED ORDER — IMMUNE GLOBULIN (HUMAN) 20 GM/200ML IJ SOLN
0.5000 g/kg | Freq: Once | INTRAMUSCULAR | Status: AC
Start: 2022-07-23 — End: 2022-07-23
  Administered 2022-07-23: 25 g via INTRAVENOUS
  Filled 2022-07-23: qty 200

## 2022-07-23 NOTE — Interdisciplinary (Signed)
Non-Chemotherapy Infusion Nursing Note - [facility]    Tiffany Chen is a 77 year old female who presents for infusion of IVIG 25 g.    Vitals:    07/23/22 0845 07/23/22 1140   BP: 127/86 (!) 125/53   BP Location: Left arm Left arm   BP Patient Position: Sitting Standing   Pulse: 70 65   Resp: 16 16   Temp: 98 F (36.7 C) 98 F (36.7 C)   TempSrc: Temporal Temporal   SpO2: 99% 99%   Weight: 64 kg (141 lb 1.5 oz)    Height: 5\' 3"  (1.6 m)      Pain Score: NA (pre med, non-pain or scheduled)  Body surface area is 1.69 meters squared.  Body mass index is 24.99 kg/m.    Pre-treatment nursing assessment:  No problems identified upon assessment. Patient reports feeling well today. PIV placed to right AC with brisk blood return noted. Patient declined vital signs throughout infusion.    Tiffany Chen tolerated treatment well.    Post blood return: Brisk  Post-Flush: NS and Discontinued IV    Medications   dextrose 5% infusion (0 mL IntraVENOUS Completed 07/23/22 1146)   diphenhydrAMINE (BENADRYL) tablet or capsule 25 mg (has no administration in time range)   acetaminophen (TYLENOL) tablet 650 mg (650 mg Oral Given 07/23/22 0903)   cetirizine (ZYRTEC) tablet 10 mg (10 mg Oral Given 07/23/22 0903)   immune globulin (human) (IgG) 25 g in 250 mL (GAMUNEX-C) infusion (0 g IntraVENOUS Completed 07/23/22 1140)     Patient Education  Learner: Patient  Barriers to learning: No Barriers  Readiness to learn: Acceptance  Method: Explanation    Treatment Education: Information/teaching given to patient including: signs and symptoms of infection, bleeding, adverse reaction(s), symptom control, and when to notify MD.    [name] Prevention Education: Instructed patient to call for assistance.    Pain Education: Patient instructed to contact nurse if pain should develop or if their current pain therapy becomes ineffective.    Response: Verbalizes understanding    Discharge Plan  Discharge instructions given to  patient.  Future appointments: request submitted.  Discharge Mode: Ambulatory  Discharge Time: 1148  Accompanied by: Self  Discharged To: Home

## 2022-07-24 ENCOUNTER — Encounter (HOSPITAL_BASED_OUTPATIENT_CLINIC_OR_DEPARTMENT_OTHER): Payer: Self-pay | Admitting: General Surgery

## 2022-07-24 ENCOUNTER — Encounter (HOSPITAL_BASED_OUTPATIENT_CLINIC_OR_DEPARTMENT_OTHER): Payer: Self-pay | Admitting: Critical Care Medicine

## 2022-07-24 DIAGNOSIS — R590 Localized enlarged lymph nodes: Secondary | ICD-10-CM

## 2022-07-24 DIAGNOSIS — K439 Ventral hernia without obstruction or gangrene: Secondary | ICD-10-CM

## 2022-07-24 DIAGNOSIS — A310 Pulmonary mycobacterial infection: Secondary | ICD-10-CM

## 2022-07-24 DIAGNOSIS — J479 Bronchiectasis, uncomplicated: Secondary | ICD-10-CM

## 2022-07-30 NOTE — Addendum Note (Signed)
Encounter addended by: Erling Cruz on: 07/30/2022 6:21 AM   Actions taken: Charge Capture section accepted

## 2022-07-31 ENCOUNTER — Ambulatory Visit (INDEPENDENT_AMBULATORY_CARE_PROVIDER_SITE_OTHER): Payer: Medicare Other | Admitting: Allergy

## 2022-07-31 ENCOUNTER — Encounter (INDEPENDENT_AMBULATORY_CARE_PROVIDER_SITE_OTHER): Payer: Self-pay | Admitting: Allergy

## 2022-07-31 VITALS — BP 93/64 | HR 86 | Temp 98.1°F | Resp 16 | Ht 63.0 in | Wt 135.0 lb

## 2022-07-31 DIAGNOSIS — A310 Pulmonary mycobacterial infection: Secondary | ICD-10-CM

## 2022-07-31 DIAGNOSIS — J479 Bronchiectasis, uncomplicated: Secondary | ICD-10-CM

## 2022-07-31 DIAGNOSIS — D809 Immunodeficiency with predominantly antibody defects, unspecified: Secondary | ICD-10-CM

## 2022-07-31 NOTE — Telephone Encounter (Signed)
You  Flossie Dibble JonasJust now (4:30 PM)       Hi Ms. Caffey, sorry for the delayed response.      There are lymph nodes in your chest that have "high activity on the PET scan". We would have expected that activity to go away and the lymph nodes to decrease in size with treatment of the infection.      Unfortunately this means we should biopsy these lymph nodes to rule out residual infection or even cancer. Cancer is very unlikely since they have remained the same size, bu it would be one of the things they check for to be absolutely sure.      So sorry that I am recommending yet another procedure, but we can't ignore this.      I will place a referral to our interventional pulmonologists. They will scheduled an intitial visit where they will explain what they would do. Please call them to schedule (Interventional Pulmonology: [phone])     I will have Candy call you tomorrow to see if you have any other questions.      [name], MD

## 2022-07-31 NOTE — Patient Instructions (Signed)
After visit summary:    Continue IVIG at [facility] monthly     Follow-up in 6 months

## 2022-07-31 NOTE — Progress Notes (Signed)
Allergy/Immunology Follow-up    Date: 07/31/2022  ______________________________________________________________________    HPI: Tiffany Chen is a 77 year old female here for follow-up for immunodeficiency. Patient was last seen on 01/30/2022 at which time recommendations were continuation of IgG replacement therapy with Gamunex-C at 25g every 4 weeks, which is tolerated.     Follow-up 01/30/22: Patient was last seen on 08/01/2021 at which time recommendations were for continuation of IgG replacement therapy ( Gamunex-C at 25g every 4 weeks ) with IgG check. Last IgG 936 in November 2023. AST slightly elevated. Pending hernia repair/GI surgery. She has noted a significant positive impact on infusions. Tolerating infusions well.     Follow-up 08/01/21: Patient was last seen on 03/19/2021 at which time recommendations were for continuation of IgG replacement therapy Gamunex-C at 25g every 4 weeks. IgG 1030 1 month ago. CBC without cytopenia.    Follow-up 03/19/21: Patient was last seen on 12/18/2020 at which time recommendations were to proceed with IVIG at [facility] starting at 500mg /kg. Patient has tolerated IVIG well, and is now s/p 3 infusions. She will be undergoing lung lobectomy on 3/14.     Follow-up 12/18/20: Patient was last seen on 09/07/2020 at which time recommendations were for additional immune work-up including repeat quantitative immunoglobulins as well as vaccine titers (tetanus and pneumococcal). Pneumovax-23 administered 09/22/20. Only 14/23 protective at recheck. Discussion on IVIG replacement therapy today, and patient has informed Tuesday or Wed in 2 weeks at [facility] would be ideal.     Initial consultation: Prior medical records have been reviewed carefully prior to this consultation and salient details have been summarized below. Patient has a history of bronchiectasis, cavitary lung infection (MAC). There is no history of chronic sinusitis. She has had multiple rounds of treatment  for MAC, with most recent antibiotic regimen including amikacin, azithromycin, ethambutol and rifampin.     Environmental and social history:  Tobacco smoke exposure:   Social History     Tobacco Use   Smoking Status Never   Smokeless Tobacco Never         Past Medical and Surgical History:  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     Past Surgical History:   Procedure Laterality Date    BRONCHOSCOPY  12/2020    CESAREAN SECTION, LOW TRANSVERSE      COLOSTOMY      CRANIOTOMY      LUNG LOBECTOMY         Allergies:  Allergies   Allergen Reactions    Walnuts Swelling     Tongue swollen and burn  Medications:    Current Outpatient Medications:     atorvastatin (LIPITOR) 20 MG tablet, Take 1 tablet (20 mg) by mouth daily., Disp: 90 tablet, Rfl: 1    biotin 1000 MCG tablet, Take 1 tablet (1,000 mcg) by mouth daily., Disp: , Rfl:     blood glucose meter, PHARMACY: Please dispense insurance approved bluetooth enable meter., Disp: 1 each, Rfl: 0    ferrous sulfate 325 (65 Fe) MG tablet,  Take 1 tablet (325 mg) by mouth daily., Disp: , Rfl:     fluoxetine (PROZAC) 40 MG capsule, Take 1 capsule (40 mg) by mouth daily., Disp: 90 capsule, Rfl: 3    glucose blood test strip, 1 strip by Other route every morning (before breakfast)., Disp: 100 strip, Rfl: 2    glucose blood test strip, 1 strip by Other route 2 times daily (before meals)., Disp: 100 strip, Rfl: 11    lancets, 1 each by Other route every morning (before breakfast)., Disp: 100 each, Rfl: 2    lancets, 1 each by Other route 2 times daily (before meals)., Disp: 100 Lancet., Rfl: 5    metFORMIN (GLUCOPHAGE) 500 mg tablet, 500 mg q AM and 1000mg  q PM, Disp: , Rfl:     Multiple Vitamin (MULTIVITAMIN) TABS tablet, Take 1 tablet by mouth daily., Disp: , Rfl:     omeprazole (PRILOSEC) 20 MG capsule, Take 1 capsule (20 mg) by mouth every morning (before breakfast)., Disp: 90 capsule, Rfl: 3    psyllium (METAMUCIL) WAFR, Take 2 Wafers by mouth every 12 hours as needed (As needed to maintain ileostomy output <1.2L)., Disp: 24 packet, Rfl: 0    ramipril (ALTACE) 2.5 MG capsule, Take 1 capsule (2.5 mg) by mouth daily., Disp: 90 capsule, Rfl: 1    sodium chloride 7 % NEBU, USE 1 VIAL VIA NEBULIZER  EVERY 12 HOURS, Disp: 720 mL, Rfl: 3    umeclidinium-vilanterol (ANORO ELLIPTA) 62.5-25 MCG/ACT inhaler, Inhale 1 puff by mouth daily., Disp: 60 each, Rfl: 11    VENTOLIN HFA 108 (90 Base) MCG/ACT inhaler, USE 2 INHALATIONS BY MOUTH  EVERY 6 HOURS AS NEEDED FOR WHEEZING, Disp: 72 g, Rfl: 3    vitamin D3 125 MCG (5000 UT) capsule, Take 1 capsule (5,000 Units) by mouth every 24 hours., Disp: , Rfl:     vitamin E 400 UNIT capsule, Take 1 capsule (400 Units) by mouth daily., Disp: , Rfl:     FH: No FH of atopic disease: asthma, allergic rhinitis, eczema, food allergies    Diagnostic studies:              IMPRESSION:  1. Post interval right upper lobectomy with a small residual right hydropneumothorax.      2. Scattered tree-in-bud airspace opacities throughout the  lungs, similar to slightly improved.  Previously seen patchy groundglass opacities within the lungs appear improved.    3. Mediastinal lymphadenopathy.  This appears slightly increased in comparison to the prior study.          Prior diagnostic studies:   CMP normal liver, total protein  CBC : no lymphopenia or thrombocytopenia                                    Impression:  77 year old here for evaluation of:    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 Bronchiectasis   3. 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 Bronchiectasis   3. MAC infection  Patient presents for follow-up for immunodeficiency with antibody defects. Given her history of persistent MAC infection, bronchiectasis, low IgM, low IgG subclass, and poor vaccine titers, advise continuation of IgG replacement therapy. IgG replacement infusion therapy is well tolerated, is medically indicated given the above clinical profile and concerns for evolution to CVID given low IgM and low IgG subclass 2/3. IgG has been stable in the [phone] range with last IgG trough at 939 in May 2024. Continues bronchiectasis / MAC management with Dr. Fransico Michael.      We have discussed both intravenous and subcutaneous immunoglobulin replacement therapy with the patient and she would like to continue with IVIG at [facility] - Tuesdays/Wednesdays best days. Risks of immunoglobulin replacement therapy were carefully reviewed with the patient as there are several black box warnings on the products. Although the risks general occur more in high dose immunoglobulin replacement regimens such as in neurological disorders, we have seen severe adverse effects at PID dosing and advise all patients that these risks are real, severe and capable of presenting at any dosing scheme. We have reviewed anaphylaxis, shock, death, pulmonary embolism, DVT, hemolytic disorders, acute renal failure  (we have seen acute renal dysfunction but not failure in non-sucrose formulations), aseptic meningitis, TRALI/pulmonary edema, swelling, weight gain and local infusion site reactions/pain.   -Will continue at current dosing Gamunex-C at 25g every 4 weeks  -Recommendations are for immunoglobulin replacement therapy  -Risks, alternatives, benefits discussed as above  -Risk of severe progression of lung disease and life-threatening infections if immunoglobulin replacement is not approved and has been clearly documented above in terms of reasons to support prescription; failure to approve could lead to life-threatening pulmonary consequences   -IgG check prior to infusions    RTC 6 months     The plan of care was discussed with the patient who expressed agreement and understanding. Questions were answered prior to conclusion of the visit.     Johnnye Sima, MD  Clinical Associate Professor, [facility]

## 2022-07-31 NOTE — Addendum Note (Signed)
Addended by: [name] on: 07/31/2022 04:31 PM     Modules accepted: Orders

## 2022-08-01 NOTE — Telephone Encounter (Signed)
MACRN: pt called to go over plan of care : referral to IP for BX-left VM i called

## 2022-08-04 ENCOUNTER — Encounter (INDEPENDENT_AMBULATORY_CARE_PROVIDER_SITE_OTHER): Payer: Self-pay | Admitting: Allergy

## 2022-08-05 ENCOUNTER — Other Ambulatory Visit: Payer: Medicare Other | Attending: Nurse Practitioner

## 2022-08-05 DIAGNOSIS — J479 Bronchiectasis, uncomplicated: Secondary | ICD-10-CM | POA: Insufficient documentation

## 2022-08-05 DIAGNOSIS — R5383 Other fatigue: Secondary | ICD-10-CM | POA: Insufficient documentation

## 2022-08-05 DIAGNOSIS — R7989 Other specified abnormal findings of blood chemistry: Secondary | ICD-10-CM | POA: Insufficient documentation

## 2022-08-05 DIAGNOSIS — E119 Type 2 diabetes mellitus without complications: Secondary | ICD-10-CM | POA: Insufficient documentation

## 2022-08-05 LAB — CBC WITH DIFF, BLOOD
ANC-Automated: 4.4 10*3/uL (ref 1.6–7.0)
Abs Basophils: 0.1 10*3/uL (ref <0.2)
Abs Eosinophils: 0.2 10*3/uL (ref 0.0–0.5)
Abs Lymphs: 1.1 10*3/uL (ref 0.8–3.1)
Abs Monos: 0.5 10*3/uL (ref 0.2–0.8)
Basophils: 0.8 %
Eosinophils: 2.8 %
Hct: 38.6 % (ref 34.0–45.0)
Hgb: 13 gm/dL (ref 11.2–15.7)
Imm Gran %: 0.3 % (ref <1)
Lymphocytes: 17.4 %
MCH: 31 pg (ref 26.0–32.0)
MCHC: 33.7 g/dL (ref 32.0–36.0)
MCV: 91.9 um3 (ref 79.0–95.0)
MPV: 11 fL (ref 9.4–12.4)
Monocytes: 8.1 %
Plt Count: 247 10*3/uL (ref 140–370)
RBC: 4.2 10*6/uL (ref 3.90–5.20)
RDW: 13.2 % (ref 12.0–14.0)
Segs: 70.6 %
WBC: 6.2 10*3/uL (ref 4.0–10.0)

## 2022-08-05 LAB — COMPREHENSIVE METABOLIC PANEL, BLOOD
ALT (SGPT): 35 U/L — ABNORMAL HIGH (ref 0–33)
AST (SGOT): 31 U/L (ref 0–32)
Albumin: 4.3 g/dL (ref 3.5–5.2)
Alkaline Phos: 79 U/L (ref 40–130)
Anion Gap: 15 mmol/L (ref 7–15)
BUN: 18 mg/dL (ref 8–23)
Bicarbonate: 19 mmol/L — ABNORMAL LOW (ref 22–29)
Bilirubin, Tot: 0.61 mg/dL (ref <1.2)
Calcium: 10 mg/dL (ref 8.5–10.6)
Chloride: 100 mmol/L (ref 98–107)
Creatinine: 1.03 mg/dL — ABNORMAL HIGH (ref 0.51–0.95)
Glucose: 151 mg/dL — ABNORMAL HIGH (ref 70–99)
Potassium: 5 mmol/L (ref 3.5–5.1)
Sodium: 134 mmol/L — ABNORMAL LOW (ref 136–145)
Total Protein: 7.2 g/dL (ref 6.0–8.0)
eGFR Based on CKD-EPI 2021 Equation: 56 mL/min/{1.73_m2}

## 2022-08-05 LAB — GLYCOSYLATED HGB(A1C), BLOOD: Glyco Hgb (A1C): 6.2 % — ABNORMAL HIGH (ref 4.8–5.8)

## 2022-08-05 LAB — FREE THYROXINE, BLOOD: Free T4: 1.2 ng/dL (ref 0.93–1.70)

## 2022-08-05 LAB — TSH, BLOOD: TSH: 3.38 u[IU]/mL (ref 0.27–4.20)

## 2022-08-06 ENCOUNTER — Encounter (INDEPENDENT_AMBULATORY_CARE_PROVIDER_SITE_OTHER): Payer: Self-pay | Admitting: Nurse Practitioner

## 2022-08-08 ENCOUNTER — Other Ambulatory Visit (INDEPENDENT_AMBULATORY_CARE_PROVIDER_SITE_OTHER): Payer: Self-pay

## 2022-08-08 DIAGNOSIS — I10 Essential (primary) hypertension: Secondary | ICD-10-CM

## 2022-08-09 NOTE — Telephone Encounter (Signed)
*The requested med passed all protocol parameters in the associated med info guide - Protocol details reviewed by pharmacist.       ACE Inhibitor Refill Protocol    Last visit in enc specialty: 07/09/2022     Recent Visits in This Encounter Department       Date Provider Department Visit Type Primary Dx    07/09/2022 Monia Sabal, MD [facility] Internal Medicine Office Visit Type 2 diabetes mellitus without complication, without long-term current use of insulin (CMS-HCC)    05/03/2022 Roque Cash, NP [facility] Internal Medicine Office Visit Type 2 diabetes mellitus without complication, without long-term current use of insulin (CMS-HCC)    12/18/2021 Monia Sabal, MD [facility] Internal Medicine Office Visit Encounter for Medicare annual wellness exam    04/23/2021 Monia Sabal, MD  [facility] Internal Medicine Office Visit Type 2 diabetes mellitus without complication, without long-term current use of insulin (CMS-HCC)           Population Health Visits  Recent [facility] Visits    None       Next appt in enc specialty: Visit date not found      Future Appointments 08/09/2022 - 08/08/2027        Date Visit Type Department Provider     08/13/2022 11:00 AM NEW IP NODULE [place] [facility] [place] ONCOLOGY Duchman, Lacie Draft, MD    Appointment Notes:     Evaluate for biopsy of mediastinal and hilar adenopathy very PET avid. 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 S/P lobe resectionsch/w ptCT 07/22/2022             08/19/2022  9:00 AM LVL 3 - 3 [facility] [place] [facility] ONCOLOGY CHEMO [facility] INFUSION, 9TH FLOOR    Appointment Notes:     IVIG 25g             08/20/2022  9:00 AM X-RAY COLON BARIUM [facility] W/WO [facility] XRAY HC DIAGNOSTIC ROOM 4    Appointment Notes:     07/24/2022 sched w pt, HC conf, no prep, pt will bring extra bag and supplies to change it if necessary -vmartin              08/26/2022  3:00 PM RETURN PULM GEN [facility] Cameron Proud, NP    Appointment Notes:     check WBC/Scr  and PET CT             08/27/2022  3:20 PM RET MIN INV SURG [facility] Donnalee Curry, MD    Appointment Notes:     follow up**Per Dr.Abbadessa Plan for ileostomy takedown with small bowel resectionFollow-up with Dr. Orlin Hilding to plan combined case             11/25/2022 12:30 PM RETURN OPHTHALMOLOGY [facility] Grant Ruts, MD    Appointment Notes:     6 month follow up             12/10/2022  2:00 PM AUDIOGRAM [facility] AUDIOLOGY Andreas Newport, AuD    Appointment Notes:     6 month Ototoxic Monitoring  Audiogram             01/29/2023  2:30 PM RETURN IMMUNODEFICIENCY [facility] Malachy Moan,  MD    Appointment Notes:     6 month immuno f/u             06/09/2023  2:00 PM AUDIOGRAM [place]  [facility] AUDIOLOGY Andreas Newport, AuD    Appointment Notes:     1 year of Ototoxic monitoring audiogram                        LABS required:  (Q year Potassium, Creatinine)    Lab Results   Component Value Date    NA 134 (L) 08/05/2022    K 5.0 08/05/2022    CL 100 08/05/2022    BICARB 19 (L) 08/05/2022    BUN 18 08/05/2022    CREAT 1.03 (H) 08/05/2022       Lab Results   Component Value Date    GFRNON >60 11/06/2020    EGFRCKDEPI 56 08/05/2022         Monitoring required:  (Q year BP) (pregnancy prn)  *If pt is pregnant, DEFER TO MD*    (BP range: Systolic=90-150  UEAVWUJWJ=19-14)  Blood Pressure   07/31/22 93/64   07/23/22 (!) 125/53   07/16/22 113/65       Pulse Readings from Last 3 Encounters:   07/31/22 86   07/23/22 65   07/16/22 76         Last Digital Health Monitoring Vitals:        Last MyChart BP Values:        Last Pt Entered MyChart BP Values:

## 2022-08-12 ENCOUNTER — Telehealth (INDEPENDENT_AMBULATORY_CARE_PROVIDER_SITE_OTHER): Payer: Self-pay

## 2022-08-12 NOTE — Telephone Encounter (Signed)
GENERAL INQUIRY:     Who is calling: Ancillary: Hope from [employer] is calling on behalf of patient     Reason for this call: Hope from [employer] is calling to inform PCP that they will be faxing over an updated RX form for Ostomy supplies, states they are adding one item.     Duplicate encounter? No previous documentation found on this issue.    Action required by office: Please fax the following items: updated RX form once received     Best way to contact: [phone]  Fax: [phone]    Inquiry has been read verbatim to this caller. Verbalizes satisfaction and confirms the above is accurate: yes     Caller has been advised this message will be transmitted to office and can expect a response within the next 24-72 hours.      Encounter created by Evalee Jefferson from the Care Assist Team.  Signature generated from controlled access password on August 12, 2022 at 9:08 AM.

## 2022-08-12 NOTE — Progress Notes (Incomplete)
REFERRING PHYSICIAN   Referring Provider:  [name], MD  [address]  [id number]  [place],  [place] [address]    Primary Care Provider:  Delight Ovens Tolentino  [address]  [place] [address]    Subjective   PATIENT IDENTIFICATION   Tiffany Chen is a 77 year old female seen in consultation originally at the request of [facility].    CHIEF COMPLAINT / HISTORY OF PRESENT ILLNESS   Tiffany Chen is a 77 year old female never smoker with bronchiectasis, h/o cavitary MAC, s/p RUL lobectomy and RML wedge resection (03/2021), T2DM who presents for evaluation of mediastinal/hilar lymphadenopathy with PET FDG-avidity.  First seen as [place] IP consult on 08/13/2022.    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) 07/09/2022    Ileostomy in place (CMS-HCC) 04/16/2022    S/P repair of ventral hernia 03/25/2022    Pseudophakia of both eyes 01/14/2022    Posterior vitreous detachment 01/14/2022    Type 2 diabetes mellitus with other specified complication, unspecified whether long term insulin use (CMS-HCC) 12/18/2021    Status post cataract extraction and insertion of intraocular lens of left eye 11/23/2021    Status post cataract extraction and insertion of intraocular lens of right eye 11/09/2021    Ventral hernia without obstruction or gangrene 07/24/2021    Colostomy status (CMS-HCC) 07/24/2021    Status post Gertie Gowda procedure (CMS-HCC) 06/26/2021    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    s/p Robotic right upper lobectomy with en bloc lower  and middle lobe wedge resection, middle lobe wedge resection 03/20/2021     03/20/21  Robotic right upper lobectomy with en bloc lower and middle lobe wedge resection, middle lobe wedge resection ([facility])      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    Gastroesophageal reflux disease, unspecified whether esophagitis present 10/12/2020     Added automatically from request for surgery [id number]      Hoarse voice quality 10/12/2020     Added automatically from request for surgery [id number]      Cavitary lesion of lung 07/24/2020    Acquired tracheobronchomegaly with bronchiectasis (CMS-HCC) 05/22/2020    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          Past Medical History   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    MAI (  mycobacterium avium-intracellulare) (CMS-HCC) 05/22/2020    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         Past Surgical History   Past Surgical History:   Procedure Laterality Date    BRONCHOSCOPY  12/2020    CESAREAN SECTION, LOW TRANSVERSE      COLOSTOMY      CRANIOTOMY      LUNG LOBECTOMY           Family History    Family History   Problem Relation Name Age of Onset    Cancer Mother Denton Lank ngs and throat    Heart Disease Mother Joya Martyr         Congrestive heart failure due to cancer    Hypertension Mother Joya Martyr     Psychiatry Mother Joya Martyr         Bi polar, manic depressive    Cancer Father Jamal Maes         Prostate    Diabetes Father Jamal Maes     Stroke Father Jamal Maes     Stroke M Grandmother Winifred Larita Fife     Cancer Brother Vallarie Mare         Skin (many locations)    Diabetes Brother Vallarie Mare          Social History   Social History     Tobacco Use    Smoking status: Never    Smokeless tobacco: Never   Substance Use Topics    Alcohol use: Yes     Comment: glass of wine every other month     Social History     Social History Narrative    Lives with brother Fayrene Fearing at home. Sister also lives in [place]        ALLERGIES     Allergies   Allergen Reactions    Walnuts Swelling     Tongue swollen and burn       HOME MEDICATIONS     Current Outpatient Medications   Medication Sig Dispense Refill    atorvastatin (LIPITOR) 20 MG tablet Take 1 tablet (20 mg) by mouth daily. 90 tablet 1    biotin 1000 MCG tablet Take 1 tablet (1,000 mcg) by mouth daily.      blood glucose meter PHARMACY: Please dispense insurance approved bluetooth enable meter. 1 each 0    ferrous sulfate 325 (65 Fe) MG tablet Take 1 tablet (325 mg) by mouth daily.      fluoxetine (PROZAC) 40 MG capsule Take 1 capsule (40 mg) by mouth daily. 90 capsule 3    glucose blood test strip 1 strip by Other route every morning (before breakfast). 100 strip 2    glucose blood test strip 1 strip by Other route 2 times daily (before meals). 100 strip 11    lancets 1 each by Other route every morning (before breakfast). 100 each 2    lancets 1 each by Other route 2 times daily (before meals). 100 Lancet. 5    metFORMIN (GLUCOPHAGE) 500 mg tablet 500 mg q AM and 1000mg  q PM      Multiple Vitamin (  MULTIVITAMIN) TABS tablet Take 1 tablet by mouth  daily.      omeprazole (PRILOSEC) 20 MG capsule Take 1 capsule (20 mg) by mouth every morning (before breakfast). 90 capsule 3    psyllium (METAMUCIL) WAFR Take 2 Wafers by mouth every 12 hours as needed (As needed to maintain ileostomy output <1.2L). 24 packet 0    ramipril (ALTACE) 2.5 MG capsule Take 1 capsule (2.5 mg) by mouth daily. 90 capsule 1    sodium chloride 7 % NEBU USE 1 VIAL VIA NEBULIZER  EVERY 12 HOURS 720 mL 3    umeclidinium-vilanterol (ANORO ELLIPTA) 62.5-25 MCG/ACT inhaler Inhale 1 puff by mouth daily. 60 each 11    VENTOLIN HFA 108 (90 Base) MCG/ACT inhaler USE 2 INHALATIONS BY MOUTH  EVERY 6 HOURS AS NEEDED FOR WHEEZING 72 g 3    vitamin D3 125 MCG (5000 UT) capsule Take 1 capsule (5,000 Units) by mouth every 24 hours.      vitamin E 400 UNIT capsule Take 1 capsule (400 Units) by mouth daily.       No current facility-administered medications for this visit.        REVIEW OF SYSTEMS   ROS  10-point review of systems was negative, except for as noted above and/or in HPI.       Objective   PHYSICAL EXAMINATION   There were no vitals taken for this visit.            ECOG performance status: {ONC ECOG:16180}  Karnofsky scale: {karnofsky:20538}  SMOKING STATUS:  {Smoking History:15980}    Physical Exam      LABS/STUDIES   All studies were reviewed personally.    CBC:  BMP:   Lab Results   Component Value Date    WBC 6.2 08/05/2022    WBC 5.3 07/03/2022    WBC 3.9 (L) 06/06/2022    HGB 13.0 08/05/2022    HGB 12.8 07/03/2022    HGB 12.3 06/06/2022    HCT 38.6 08/05/2022    HCT 38.0 07/03/2022    HCT 36.0 06/06/2022    PLT 247 08/05/2022    PLT 240 07/03/2022    PLT 207 06/06/2022    SEG 70.6 08/05/2022    SEG 55.2 07/03/2022    SEG 61.7 06/06/2022    LYMPHS 17.4 08/05/2022    LYMPHS 30.4 07/03/2022    LYMPHS 21.7 06/06/2022    MONOS 8.1 08/05/2022    MONOS 9.7 07/03/2022    MONOS 12.1 06/06/2022     Lab Results   Component Value Date    NA 134 (L) 08/05/2022    NA 139 07/03/2022    NA 140 06/06/2022     K 5.0 08/05/2022    K 4.4 07/03/2022    K 4.2 06/06/2022    CL 100 08/05/2022    CL 102 07/03/2022    CL 106 06/06/2022    BICARB 19 (L) 08/05/2022    BICARB 26 07/03/2022    BICARB 24 06/06/2022    BUN 18 08/05/2022    BUN 16 07/03/2022    BUN 14 06/06/2022    CREAT 1.03 (H) 08/05/2022    CREAT 0.98 (H) 07/03/2022    CREAT 0.77 06/06/2022    GLU 151 (H) 08/05/2022    GLU 169 (H) 07/03/2022    GLU 141 (H) 06/06/2022    [place] 10.0 08/05/2022    [place] 9.7 07/03/2022     9.9 06/06/2022    MG 1.9 04/03/2022  MG 2.0 04/02/2022    MG 2.1 04/01/2022    PHOS 3.7 04/03/2022    PHOS 3.5 04/02/2022    PHOS 3.2 04/01/2022        Coags:  LFTs:   No results found for: "PTT", "INR", "PT"   Lab Results   Component Value Date    ALK 79 08/05/2022    ALK 64 07/03/2022    ALK 59 06/06/2022    AST 31 08/05/2022    AST 29 07/03/2022    AST 21 06/06/2022    ALT 35 (H) 08/05/2022    ALT 28 07/03/2022    ALT 22 06/06/2022    TBILI 0.61 08/05/2022    TBILI 0.36 07/03/2022    TBILI 0.46 06/06/2022    DBILI <0.2 07/17/2020    TP 7.2 08/05/2022    TP 6.7 07/03/2022    TP 6.7 06/06/2022    ALB 4.3 08/05/2022    ALB 4.2 07/03/2022    ALB 4.2 06/06/2022        ABG   No results found for: "ARTPH", "ARTPCO2", "ARTPO2", "ARTHC03", "ARTBE", "ARTO2SAT"        Microbiology:    No results found for: "BLOODCULT", "FUNGALBC", "AFBBACTCULT", "URINECULTURE", "QTFERON", "QUANTIFERON", "CRYPTOAG", "CRYPTOCAGCSF", "COCCICF", "COCCICFCSF", "COCCIIMMDIIF", "COCCIIMMCSF", "HISTOAGUR", "CMVDNAQT", "CMVDNAQTCSF"    No results found for this or any previous visit.    Pathology:  N/A    Imaging:   Last CT Chest Result       CT Chest W/O Contrast  Exam End: 05/20/2022  1:40 PM (Final result)   Impression: IMPRESSION:  Nonspecific, waxing and waning moderate mediastinal or hilar lymphadenopathy. CT follow-up in 6 months is suggested for re-evaluation. If lymphadenopathy worsens at that time, consider workup for underlying hematological malignancy.    Expected evolution  of postoperative changes in the right hemithorax.    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                 07/22/2022 PET/CT:          Echo:  Transthoracic Echocardiogram (TTE)  No results found for this or any previous visit.    PFTs:       No data to display                      Assessment   ASSESSMENT/IMPRESSION     Problem List Items Addressed This Visit    None      Tiffany Chen is a 77 year old female never smoker with bronchiectasis, h/o cavitary MAC, s/p RUL lobectomy and RML wedge resection (03/2021), T2DM who presents for evaluation of mediastinal/hilar lymphadenopathy with PET FDG-avidity.    #Mediastinal & hilar LAD with PET FDG-avidity:  Likely related to infectious/inflammatory process.  But neoplastic/malignant process cannot be excluded.  Lymphoma is possible.  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  Consider LN cryobiopsies due to lymphoma possibility.  -BAL of remaining RML, LLL (LB6)  -F/u 1-2 weeks after procedure to follow-up results.      Care Coordination:   Anticoagulations:  None    GLP - 1 Medications Review:  Ozempic/Wegovy/Saxenda (  semaglutide)  no   Rybelsus (semaglutide tablets)  no   Victoza (liraglutide)  no   Trulicity (dulaglutide)   no   Byetta (exenatide)  no   Adlyxin (lixisenatide)  no     Anesthesia Prep Clinic Requirement:  no  Overnight Stay Required:  no  New CT CHEST Required: no    Additional labs? (please include orders) - no    Additional imaging? (please include orders) - no         I personally reviewed and interpreted independently the radiographic and laboratory studies and evaluated the patient.   I discussed the plan above with the patient.  Tiffany Chen  expressed an understanding of this discussion and recommendations above.  All questions were answered to patient's satisfaction.  The patient has my contact information, and understands to call with any additional questions or concerns.    I personally spent *** minutes in face-to-face and non-face-to-face activities related the patient's visit today, excluding any separately reportable services/procedures.  This time also excludes any interpretation services if applicable.    Lianne Bushy, MD  Pulmonary/Critical Care Attending Physician  [facility]  08/13/2022    CC:  [name], MD  [address]  [id number]  [place],  [place] [address]  Patient Care Team:  [place], [place] Larey Dresser, MD as PCP - General      CC:  [name], MD  [address]  [id number]  [place],  [place] [address]  Patient Care Team:  [place], [place] Larey Dresser, MD as PCP - General

## 2022-08-12 NOTE — Telephone Encounter (Signed)
Noted. Will wait on fax

## 2022-08-13 ENCOUNTER — Ambulatory Visit: Payer: Medicare Other | Attending: Critical Care Medicine | Admitting: Internal Medicine

## 2022-08-13 VITALS — BP 115/73 | HR 78 | Temp 97.8°F | Resp 16 | Ht 63.0 in | Wt 133.0 lb

## 2022-08-13 DIAGNOSIS — J479 Bronchiectasis, uncomplicated: Secondary | ICD-10-CM

## 2022-08-13 DIAGNOSIS — A310 Pulmonary mycobacterial infection: Secondary | ICD-10-CM | POA: Insufficient documentation

## 2022-08-13 DIAGNOSIS — R590 Localized enlarged lymph nodes: Secondary | ICD-10-CM | POA: Insufficient documentation

## 2022-08-13 NOTE — Patient Instructions (Addendum)
-  One of our case managers will call to schedule your bronchoscopy procedure.  -We will arrange follow-up back with [place] in 1-2 weeks after that procedure to discuss results.

## 2022-08-14 NOTE — Telephone Encounter (Signed)
Fax Received    Date Received: 08/12/2022      Time: 9:15 am    Date Processed: 08/14/2022    From:  [employer]    Document Type: DME/ Order form for colostomy supplies      received @ 9:15 am; located in physician's "Print Folder" folder ending in [id number]. Please send to "DONE FOLDER" after printing.    Thank you.

## 2022-08-14 NOTE — Telephone Encounter (Signed)
Forms printed and placed in MD's in-box in the tower of power.

## 2022-08-14 NOTE — Telephone Encounter (Signed)
Who is calling: Ancillary: Hope from [employer] is calling on behalf of patient  Insurance Coverage Verified: Active- in network  Reason for this call:  Hope with [employer] following up on medical supply prescription for Ostomy supplies. Hope will re-fax prescription again today 08/14/2022    Action required by office: Please contact caller     Duplicate encounter? No previous documentation found on this issue.     Best way to contact: [phone]  Alternative:     Inquiry has been read verbatim to this caller. Verbalizes satisfaction and confirms the above is accurate: yes      Has been advised this message will be transmitted to office and can expect a response within the next 24-72 hours.

## 2022-08-15 NOTE — Telephone Encounter (Signed)
Fax received and placed in Dr. Delena Bali folder for review.

## 2022-08-19 ENCOUNTER — Ambulatory Visit
Admission: RE | Admit: 2022-08-19 | Discharge: 2022-08-19 | Disposition: A | Discharge status: Home / Self Care | Payer: Medicare Other | Attending: Allergy | Admitting: Allergy

## 2022-08-19 VITALS — BP 115/62 | HR 66 | Temp 97.3°F | Resp 16 | Ht 63.0 in | Wt 140.0 lb

## 2022-08-19 DIAGNOSIS — D809 Immunodeficiency with predominantly antibody defects, unspecified: Secondary | ICD-10-CM | POA: Insufficient documentation

## 2022-08-19 MED ORDER — IMMUNE GLOBULIN (HUMAN) 20 GM/200ML IJ SOLN
0.5000 g/kg | Freq: Once | INTRAMUSCULAR | Status: AC
Start: 2022-08-19 — End: 2022-08-19
  Administered 2022-08-19: 25 g via INTRAVENOUS
  Filled 2022-08-19: qty 50

## 2022-08-19 MED ORDER — DEXTROSE 5 % IV SOLN
INTRAVENOUS | Status: DC
Start: 2022-08-19 — End: 2022-08-19

## 2022-08-19 MED ORDER — ACETAMINOPHEN 325 MG PO TABS
650.0000 mg | ORAL_TABLET | Freq: Once | ORAL | Status: AC
Start: 2022-08-19 — End: 2022-08-19
  Administered 2022-08-19: 650 mg via ORAL
  Filled 2022-08-19: qty 2

## 2022-08-19 MED ORDER — CETIRIZINE HCL 10 MG OR TABS
10.0000 mg | ORAL_TABLET | Freq: Once | ORAL | Status: AC
Start: 2022-08-19 — End: 2022-08-19
  Administered 2022-08-19: 10 mg via ORAL
  Filled 2022-08-19: qty 1

## 2022-08-19 NOTE — Interdisciplinary (Signed)
Non-Chemotherapy Infusion Nursing Note - [facility] Tiffany Chen is a 77 year old female who presents for infusion of IVIG 25g    Vitals:    08/19/22 0851 08/19/22 1144   BP: 120/69 115/62   BP Location: Left arm Right arm   BP Patient Position: Sitting Semi-Fowlers   Pulse: 75 66   Resp: 16 16   Temp: 98 F (36.7 C) 97.3 F (36.3 C)   TempSrc:  Temporal   SpO2: 100% 99%   Weight: 63.5 kg (139 lb 15.9 oz)    Height: 5\' 3"  (1.6 m)      Pain Score: NA (pre med, non-pain or scheduled)  Body surface area is 1.68 meters squared.  Body mass index is 24.8 kg/m.    Pre-treatment nursing assessment:  No problems identified upon assessment.    Tiffany Chen tolerated treatment well.    Post blood return: Brisk  Post-Flush: NS and Discontinued IV    Patient Education  Learner: Patient  Barriers to learning: No Barriers  Readiness to learn: Acceptance  Method: Explanation    Treatment Education: Information/teaching given to patient including: signs and symptoms of infection, bleeding, adverse reaction(s), symptom control, and when to notify MD.    [name] Prevention Education: Instructed patient to call for assistance.    Pain Education: Patient instructed to contact nurse if pain should develop or if their current pain therapy becomes ineffective.    Response: Verbalizes understanding    Discharge Plan  Discharge instructions given to patient.  Future appointments given and reviewed with treatment plan.  Discharge Mode: Ambulatory  Discharge Time: 1150  Accompanied by: Self  Discharged To: Home     Medications   dextrose 5% infusion (0 mL IntraVENOUS Stopped 08/19/22 1146)   acetaminophen (TYLENOL) tablet 650 mg (650 mg Oral Given 08/19/22 0910)   cetirizine (ZYRTEC) tablet 10 mg (10 mg Oral Given 08/19/22 0910)   immune globulin (human) (IgG) 25 g in 250 mL (GAMUNEX-C) infusion (0 g IntraVENOUS Completed 08/19/22 1142)

## 2022-08-20 ENCOUNTER — Ambulatory Visit
Admission: RE | Admit: 2022-08-20 | Discharge: 2022-08-20 | Disposition: A | Discharge status: Home / Self Care | Payer: Medicare Other | Attending: Surgery | Admitting: Surgery

## 2022-08-20 DIAGNOSIS — Z932 Ileostomy status: Secondary | ICD-10-CM | POA: Insufficient documentation

## 2022-08-20 DIAGNOSIS — E119 Type 2 diabetes mellitus without complications: Secondary | ICD-10-CM | POA: Insufficient documentation

## 2022-08-20 MED ORDER — DIATRIZOATE MEGLUMINE & SODIUM 66-10 % OR SOLN
120.0000 mL | Freq: Once | ORAL | Status: AC
Start: 2022-08-20 — End: 2022-08-20
  Administered 2022-08-20: 120 mL via ORAL
  Filled 2022-08-20: qty 120

## 2022-08-20 NOTE — Progress Notes (Unsigned)
PULMONARY OUTPATIENT CLINIC FOLLOW-UP NOTE    No chief complaint on file.      History of Present Illness:  Tiffany Chen is a 77 year old female with history of bronchiectasis, cavitary MAC lung infection, ruptured diverticulitis s/p colostomy in 2009, DM, ICH s/p embolization. She is here to follow up on bronchiectasis and MAC lung infection.      Patient's last visit on 04/17/2022 with Tiffany Chen to follow up on bronchiectasis and MAC lung infection. I saw Tiffany Chen the last on 10/16/2021 to follow up on bronchiectasis and MAC lung infection. She states doing well from a respiratory stand point since last visit. She denies ED visits or recent hospitalizations.       Tiffany Chen has history of MAC lung infection treated multiple times with azithromycin 500mg , Rifampin 600mg  , ethambutol 1600mg  three times weekly: 2018- 01/19/2018, 05/25/2018- 10/22/2019 and 12/22/2019- 02/16/2020.     As per last note from Tiffany Chen:   In late 2021, CT scan showed enlarging cavity. December 2021 a bronch was performed but no MAC growth. Her doctor restarted azithro/Rifampin/ethmabutol based on imaging.   Feb 2022 she saw Tiffany Chen who stopped the antibiotics based on the lack of microbiologic data. Performed repeat bronch 04/28/2020 which has smear AFB 3+ and culture MAC (drug susc pending).      07/25/2019 - she was strongly recommended to start new round of MAC antibiotic therapy due to the severity of the lung cavitation and severity of her symptoms. The regimen recommended was IV antibiotic (Amikacin) home infusion for 3 months, azithromycin 250 mg daily, ethambutol 800 mg daily and rifampin 600 mg daily.       08/14/2020 - She started on MAC antibiotic therapy with tentative plan to stop IV amikacin on 11/24/2020 and replaced with arikayce. Her IV was removed on 11/24/2020 and also discussed the possibility of right upper lobe lobectomy.    11/23/2020 - she started on Arikayce nebs every day. She reports  mild sore throat with Arykace but was able to tolerate with hot tea.       01/17/2021 - she was added clofazimine to her regimen.  In addition to azithro/rifampin/ethambutol and Arikayce. She also was referred to Tiffany Chen for consideration for right upper and middle lobectomy. She was scheduled for RUL lobectomy on 03/20/2021.      02/21/2021 _ She was added clofazimine.      03/20/2021 -  She have had Right Robotic upper lobectomy with middle and lower  lobe wedge resection.      Her current regimen included: azithromycin, rifampin, ethambutol, clofazimine (which was started 02/21/2021) and inhaled Arikayce.     While on therapy she was advised to have lab work twice a week while on IV antibiotic and then after every month when off from IV antibiotic, continue with Audiogram every 3-4 months, submit sputum samples x 3 every month, EKG every 3-4 months and continue with ishihara discrimination color test.     She was started on pulmonary rehab but decided to do it independently/     Tiffany Chen states been somehow overwhelm after lobectomy and has forgot to due test required for MAC treatment.     She currently denies any side effects with current MAC antibiotics. She reports tolerating well MAC antibiotic therapy.  Overall, she feels better since started on MAC antibiotics.      She will have cataract surgery on 11/2009 and 2016.      Today, [name] states feeling well at her baseline. She reports symptom improvement with current MAC antibiotic therapy. She reports increased cough after her lobectomy surgery, however her cough has been non productive and not been able to provide a sample for culture. Currently denies cough.  She continues taking MAC antibiotics religiously. She has been doing air way clearance once a day with saline solution twice a day. She denies wheezing or dyspnea. She denies hemoptysis, fevers, chills, nausea, vomiting or night sweats.  She feels warm at night. She is currently capable to  walk more several miles without feeling winded. Her appetite had improved and she noticed some weight gain ( 7 pounds since last visit).  She denies feeling winded when going uphill or on stairs.  She denies chest congestion, orthopnea, PND lower extremity edema. She reports history of palpitations. She denies allergic rhinitis, sinus problems or other URI. She has history of GERD controlled with diet and PPI. She have had influenza vaccine on 10/2021 and competed COVID vaccines with 3 booster. .     Lab work from 07/24/2021 within normal limits( negative for thrombocytopenia, kidney or liver toxicity). EKG from 06/21/2021 with sinus rhythm ( due). She have had audiogram with stable hearing on 09/04/2021 and was recommended to switch to audiogram every 3 month.     Respiratory culture and AFB from 08/31/2021 and 08/28/2021 with preliminary negative results. Last positive AFB culture from 02/15/2021.     Review of Systems:   A full  review of systems was performed and the pertinent positives and negatives were mentioned in the HPI, all other review of systems were negative.     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)     s/p Robotic right upper lobectomy with en bloc lower and middle lobe wedge resection, middle lobe wedge resection  03/20/2021    03/20/21 Robotic right upper lobectomy with en bloc lower and middle lobe wedge resection, middle lobe wedge resection ([facility])       Past Surgical History:   Procedure Laterality Date    BRONCHOSCOPY  12/2020    CESAREAN SECTION, LOW TRANSVERSE      COLOSTOMY      CRANIOTOMY      LUNG LOBECTOMY         Family History   Problem Relation Name Age of Onset    Cancer Mother Denton Lank ngs and throat    Heart Disease Mother Joya Martyr         Congrestive heart failure due to cancer    Hypertension Mother Joya Martyr     Psychiatry Mother Joya Martyr         Bi polar, manic depressive    Cancer Father Jamal Maes         Prostate    Diabetes Father Jamal Maes  Stroke Father Jamal Maes     Stroke M Grandmother Winifred Larita Fife     Cancer Brother Vallarie Mare         Skin (many locations)    Diabetes Brother Vallarie Mare        Social History     Socioeconomic History    Marital status: Widowed   Tobacco Use    Smoking status: Never    Smokeless tobacco: Never   Vaping Use    Vaping status: Never Used   Substance and Sexual Activity    Alcohol use: Yes     Comment: glass of wine every other month    Drug use: Not Currently    Sexual activity: Not Currently   Other Topics Concern    Military Service No    Blood Transfusions No    Caffeine Concern No    Occupational Exposure No    Hobby Hazards Yes     Comment: Gardening, MAC    Sleep Concern No    Stress Concern Yes     Comment: Husband death Cov28, 2018/09/13, sold home, rehomed pets. [place]    Weight Concern No    Special Diet Yes    Back Care No    Exercises Regularly Yes    Bike Helmet Use No    Seat Belt Use No    Performs Self-Exams No   Social History Narrative    Lives with brother Fayrene Fearing at home. Sister also lives in [facility]     Social Determinants of Health     Food Insecurity: No Food Insecurity (07/09/2022)    Hunger Vital Sign     Worried About Running Out of Food in the Last Year: Never true     Ran Out of Food in the Last Year: Never true    Transportation Needs: No Transportation Needs (07/09/2022)    PRAPARE - Therapist, art (Medical): No     Lack of Transportation (Non-Medical): No   Intimate Partner Violence: Low Risk  (03/25/2022)     IPV     Have you ever been emotionally or physically abused by your partner or someone important to you?: No     Within the last year have you been hit, slapped, kicked or otherwise physically hurt by someone?: No     Within the last year has anyone forced you to have sexual activities?: No     Are you afraid of your spouse or partner you listed above?: No   Housing Stability: Unknown (07/09/2022)    Housing Stability Vital Sign     Unable to Pay for Housing in the Last Year: No     Unstable Housing in the Last Year: No       Allergies   Allergen Reactions    Walnuts Swelling     Tongue swollen and burn       Current Outpatient Medications on File Prior to Visit   Medication Sig Dispense Refill    atorvastatin (LIPITOR) 20 MG tablet Take 1 tablet (20 mg) by mouth daily. 90 tablet 1    biotin 1000 MCG tablet Take 1 tablet (1,000 mcg) by mouth daily.      blood glucose meter PHARMACY: Please dispense insurance approved bluetooth enable meter. 1 each 0    ferrous sulfate 325 (65 Fe) MG tablet Take 1 tablet (325 mg) by mouth daily.      fluoxetine (PROZAC) 40 MG capsule Take 1 capsule (40 mg)  by mouth daily. 90 capsule 3    glucose blood test strip 1 strip by Other route every morning (before breakfast). 100 strip 2    glucose blood test strip 1 strip by Other route 2 times daily (before meals). 100 strip 11    lancets 1 each by Other route every morning (before breakfast). 100 each 2    lancets 1 each by Other route 2 times daily (before meals). 100 Lancet. 5    metFORMIN (GLUCOPHAGE) 500 mg tablet 500 mg q AM and 1000mg  q PM      Multiple Vitamin (MULTIVITAMIN) TABS tablet Take 1 tablet by mouth daily.      omeprazole (PRILOSEC) 20 MG capsule Take 1 capsule (20 mg) by mouth every morning  (before breakfast). 90 capsule 3    psyllium (METAMUCIL) WAFR Take 2 Wafers by mouth every 12 hours as needed (As needed to maintain ileostomy output <1.2L). 24 packet 0    ramipril (ALTACE) 2.5 MG capsule Take 1 capsule (2.5 mg) by mouth daily. 90 capsule 3    sodium chloride 7 % NEBU USE 1 VIAL VIA NEBULIZER  EVERY 12 HOURS 720 mL 3    umeclidinium-vilanterol (ANORO ELLIPTA) 62.5-25 MCG/ACT inhaler Inhale 1 puff by mouth daily. 60 each 11    VENTOLIN HFA 108 (90 Base) MCG/ACT inhaler USE 2 INHALATIONS BY MOUTH  EVERY 6 HOURS AS NEEDED FOR WHEEZING 72 g 3    vitamin D3 125 MCG (5000 UT) capsule Take 1 capsule (5,000 Units) by mouth every 24 hours.      vitamin E 400 UNIT capsule Take 1 capsule (400 Units) by mouth daily.       Current Facility-Administered Medications on File Prior to Visit   Medication Dose Route Frequency Provider Last Rate Last Admin    [COMPLETED] acetaminophen (TYLENOL) tablet 650 mg  650 mg Oral Once Malachy Moan, MD   650 mg at 08/19/22 0910    [COMPLETED] cetirizine (ZYRTEC) tablet 10 mg  10 mg Oral Once Malachy Moan, MD   10 mg at 08/19/22 1610    [DISCONTINUED] dextrose 5% infusion   IntraVENOUS Continuous Malachy Moan, MD   Stopped at 08/19/22 1146    [COMPLETED] immune globulin (human) (IgG) 25 g in 250 mL [facility]) infusion  0.5 g/kg (Treatment Plan Adjusted) IntraVENOUS Once Malachy Moan, MD   Completed at 08/19/22 1142       Physical Examination:  There were no vitals taken for this visit.           General: well appearing alert and oriented x 3, in nad.  Oropharynx: without any lesions  Heart:  Regular rate and rhythm, normal S1 S2, no murmurs.  Lungs: clear to auscultation, right upper lobe wheezing, rales, rhonchi, no chest deformities noted. Normal I:E ratio  Extremities:  no cyanosis, clubbing, or edema.  Skin:  No rashes or lesions.  Psych: normal affect and mood       I have reviewed the following  laboratory data and other diagnostic  studies:   CBC:  Lab Results   Component Value Date    WBC 6.2 08/05/2022    HGB 13.0 08/05/2022    HCT 38.6 08/05/2022    PLT 247 08/05/2022     CHEM:  Lab Results   Component Value Date    NA 134 (L) 08/05/2022    K 5.0 08/05/2022    CL 100 08/05/2022    BICARB 19 (L) 08/05/2022  BUN 18 08/05/2022    CREAT 1.03 (H) 08/05/2022    GLU 151 (H) 08/05/2022    [place] 10.0 08/05/2022     COAG:  No results found for: "PT", "PTT", "INR"  LFTs:  Lab Results   Component Value Date    AST 31 08/05/2022    ALT 35 (H) 08/05/2022    ALK 79 08/05/2022    TP 7.2 08/05/2022    ALB 4.3 08/05/2022    TBILI 0.61 08/05/2022    DBILI <0.2 07/17/2020          Review of Radiology Studies:    Pulmonary Function Test Review:  04/28/2020 (care everywhere): noraml spito, normal volumes, mildly reduced DLCO     Radiology Review:    Chest x ray (03/20/2021):  FINDINGS:  Lines and Tubes: Large-bore right-sided chest tube seen in place with some subcutaneous emphysema  Mediastinum: The cardiomediastinal silhouette is within normal limits. No lymphadenopathy is appreciated.  Lungs: The lungs are clear. Status post right upper lobe lobectomy with re-expansion of the lung tissue.  Pleura: No pneumothorax or effusion.  Bones and soft tissues: Unchanged    IMPRESSION:  Right-sided large-bore chest tube with no evidence of pneumothorax. Status post right upper lobectomy    Chest x ray (12/25/2020):  FINDINGS:  No pleural effusion or pneumothorax demonstrated.     Right upper lobe cavitary lesions.     Additional small nodularity better seen on the comparison chest CT.     Unremarkable cardiac silhouette.     Slight curvature of the descending thoracic aorta.     No acute osseous abnormality identified.    IMPRESSION:  No postprocedural complication identified.    CT chest (05/20/2022):  FINDINGS:     LINES AND TUBES: None.     THYROID AND THORACIC INLET: Unremarkable.     LYMPH NODES: Waxing and waning moderate mediastinal and hilar lymphadenopathy,  slightly increased.     CARDIOVASCULAR: Unchanged aortic and coronary calcification.     PERICARDIUM: Unremarkable.     ESOPHAGUS: Unremarkable.     LUNG: Postoperative changes in the right hemithorax again noted, with expected evolution. Multifocal cylindrical bronchiectasis with associated clustered tree-in-bud nodularity, not substantially changed compared to the last study. Patent central airways.     PLEURA: Unremarkable.     ABDOMEN: Unchanged upper abdomen.     BONE AND SOFT TISSUE: Mild disc degeneration. No suspicious bony lesions. Diffuse osteopenia, as before.    IMPRESSION:  Nonspecific, waxing and waning moderate mediastinal or hilar lymphadenopathy. CT follow-up in 6 months is suggested for re-evaluation. If lymphadenopathy worsens at that time, consider workup for underlying hematological malignancy.     Expected evolution of postoperative changes in the right hemithorax.     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    CT chest (06/26/2021):  FINDINGS:  Lines and Tubes: None   Thyroid and thoracic inlet: Normal.   Lymph nodes: There is a left lower cervical lymphadenopathy measuring up to 1.1 cm in short axis diameter, previously 9 mm.  There is left upper paratracheal mediastinal lymphadenopathy measuring up to 1.4 cm in short axis diameter, previously 1 cm.  There is periaortic lymphadenopathy measuring up to 1 cm in short axis diameter, previously 9 mm.  There is subaortic lymphadenopathy measuring up to 1.4 cm in short axis diameter, previously 1.1 cm.  A right subcarinal lymph node measures 1.4 cm in short axis diameter, previously 7 mm.  Left  subcarinal lymph nodes measure approximately 1.2 cm in short axis diameter, previously 1.1 cm.  Mediastinal lymph nodes are not well assessed in the absence of intravenous contrast.  Cardiovascular: Heart size is normal.  Mild coronary artery calcification is present the thoracic aorta  pulmonary artery are normal in caliber.  Pericardium: There is a trace pericardial effusion, similar to the prior.  Esophagus: Normal.  Lung: Patient is status post interval right upper lobectomy.  There are scattered tree-in-bud opacities present within the lungs similar to slightly improved in comparison to the prior study.  Previously seen scattered patchy groundglass opacities appear improved.  Pleura: There is a small residual right hydropneumothorax.  Abdomen: Please see the concurrently performed CT abdomen pelvis report for findings below the diaphragm.  Bone and soft tissue: No acute abnormality.    IMPRESSION:  1. Post interval right upper lobectomy with a small residual right hydropneumothorax.      2. Scattered tree-in-bud airspace opacities throughout the lungs, similar to slightly improved.  Previously seen patchy groundglass opacities within the lungs appear improved.    3. Mediastinal lymphadenopathy.  This appears slightly increased in comparison to the prior study.     Ct chest (01/19/2021):  FINDINGS:  Lines and Tubes: None     Thyroid and thoracic inlet: Normal.     Lymph nodes: 11 mm AP window lymph node measured 7 mm on the prior study. Pre-vascular 7 mm lymph node measured 4 mm on the prior study. Subcarinal lymphadenopathy may be slightly increased.     Cardiovascular: Atherosclerotic disease.     Pericardium: Small amount of pericardial fluid is unchanged     Esophagus: Normal.     Lung: 3.4 cm cavitary mass in the posterior segment of the right lower lobe measured 3 cm on the prior study. Increasing ground-glass opacity throughout the anterior and apical segments of the right upper lobe. Increasing consolidation at the right apex. Increasing nodularity and bronchial wall thickening in the right upper lobe. Wall thickness of a cavity in the right upper lobe on series 4, image 334 measured 9 mm. This measured 3 mm on the prior study. Increasing ground-glass opacity in the lower lobes.  Increasing tree-in-bud nodularity. Previously seen nodularity persists.     Pleura: Normal.     Abdomen: Visualized superior abdomen demonstrates hepatic hypodensity which is unchanged     Bone and soft tissue: No acute abnormality.    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 Additionally, ground-glass opacity and nodularity has increased.    CT chest (11/09/2020):  FINDINGS:  Lines and Tubes: PICC ends in the left brachiocephalic vein     Thyroid and thoracic inlet: Normal.  Lymph nodes: Lymph nodes have increased in size. 8 mm AP window lymph node measured 4 mm on the prior study. 8 mm lymph node adjacent to the superior vena cava measured 6 mm on the prior study. Other lymph nodes are unchanged.  Cardiovascular: Atherosclerotic disease is relatively mild for the patient's age.  Pericardium: Small pericardial effusion  Esophagus: Normal.     Lung: Comparison to the outside study is limited due to poor spatial resolution on that study given 5 mm slice thickness versus 0.625 mm slice thickness. New thick walled cavitary 2.6 cm right apical nodule on series 2, image 393 adjacent 12 mm thick walled cavitary nodule is new. Worsening tree-in-bud nodularity and bronchiectasis in the right upper lobe. Thick walled  cavity in the posterior segment of the right upper lobe measuring 3.1 cm is unchanged in size but demonstrates increased wall thickness. Surrounding tree-in-bud nodularity is increased. Cavitary nodule in the right middle lobe is unchanged. Bronchiectasis may be increased but is again difficult to assess given differences in slice thickness. Worsening tree-in-bud nodularity throughout the right middle and right lower lobes. Worsening nodular consolidation in the medial aspect of the right middle lobe. New areas of nodularity throughout the left lung.  Pleura: Normal.     Abdomen: 1.2 cm  hypoattenuating nodule in the liver. This is unchanged     Bone and soft tissue: No acute abnormality.    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    CT chest 04/13/2020:   CONCLUSION:   1. 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   2. Cavitary lesion in the periphery of the right lung has increased in size measuring 2.7 cm, although may have a slightly less thickened and nodular wall.   3.  Diffuse bronchiectasis most pronounced in the right upper lobe.      Microbiology  BAL 04/28/2020 (from Dr. Merryl Hacker): AFB smear 3+, culture MAC (drug susceptibly testing sent to [facility]- pending)       6 MIN WALK (01/30/2021):      PFT (08/17/2021):      PFTs ( 02/16/2021):      ASSESSMENT AND PLAN:  Tiffany Chen is a 77 year old female with history of bronchiectasis, cavitary MAC lung infection, ruptured diverticulitis s/p colostomy in 2009, DM, ICH s/p embolization. She is here to follow up on bronchiectasis and MAC lung infection.     # Bronchiectasis/ Cavitary MAC lung infection: currently stable and asymptomatic. She was advised to start MAC antibiotic therapy due to the severity of her lesions and symptoms. She started on  IV antibiotic Amikacin home infusion for 3 months (08/14/2020 to 11/24/2020), azithromycin 250 mg daily, ethambutol 800 mg daily and rifampin 600 mg daily.  She was started on inhaled Arikayce on 11/23/2020.  Clofazimine was added on 1/11/20223. She have had right Robotic upper lobectomy with middle and lower  lobe wedge resection on 03/20/2021. She reports improved condition with current MAC antibiotic therapy. She reports increased non productive cough since her lobectomy surgery and no other symptom. She has been adherent to MAC antibiotics and air way clearance. Her current regimen included:  azithromycin 250 mg daily, rifampin 600 mg daily, ethambutol 800 mg daily, clofazimine 100 mg daily (which was started 02/21/2021) and daily inhaled Arikayce. She reports exertional dyspnea but no other symptoms. She reports improved pulmonary condition since started on MAC antibiotics. PFT from 08/11/202 wnl.[name] PFT from 02/16/2021 with ( FVC 2.00- 79%, FEV 1 1.60 - 85%, FEV1/FVC 80, DLCOc 80%).  CT chest from 06/2021 with improved findings compared to previous CT chest. CT Chest from 04/13/2020 with micronodular and tree in bud opacity consistent with MAC infection. Cavitary lesion to RUL about 2.7 cm. CT chest from 11/09/2020 with worsening of nontuberculous mycobacterial disease infection involving the right lung (new thick walled cavitary 2.6 cm right apical nodule on series 2, image 393 adjacent 12 mm thick walled cavitary nodule is new. Worsening tree-in-bud nodularity and bronchiectasis in the right upper lobe). Ct chest from 01/19/2021 with worsenig cavitary nontuberculous mycobacterial disease has worsened with increasing size of cavitary lesions with associated  increasing wall thickness. Sputum cultures from 08/2021 x 2 with negatige preliminary AFB. I have discussed air way clearance and MAC antibiotic therapy. I have recommended her to continue with air way clearance once a day with saline nebulizer solution followed by acapella device . Patient was instructed to use albuterol inhaler 2 puffs before air way clearance and as needed for cough, wheezing or shortness of breath. I have recommended her continue with Arikayce daily, azithromycin 250 mg daily, ethambutol 800 mg daily, clofazimine 100 mg daily and rifampin 600 mg daily. She is overdue for lab work. EKG and Audiogram (11/2021). I have reminded her to continue with lab work every month, sputum cultures for AFB and respiratory cultures x 3 per month, EKG and Audiogram every 3-4 months  and to continue with ishihara discrimination color chart. Patient  was advised to monitor for respiratory symptoms and call back if any changes. Reminded about pending PFT and continue submitting sputum samples for culture.  ED precautions given.     # Immunizations: up to date.     "I personally spend a total of 60 Minutes in face to face and non face to face activities related to patient's visit today, excluding and separately reportable services/procedures."    Follow up with Tiffany Chen in 2 months.     Cameron Proud NP-BC  Pulmonary Medicine

## 2022-08-21 NOTE — Telephone Encounter (Signed)
I found an ostomy supply form in my folder with Dr Shaune Leeks name. Never the less, I have signed it on her behalf. As I have not been physically in office for 3 weeks this should have been routed to an alternate provider. In any case, please find the form in my " complete" folder for my MA.

## 2022-08-21 NOTE — Telephone Encounter (Signed)
Who is calling: patient  Insurance Coverage Verified: active Medicare    Reason for this call:    Patient called regarding urgent DME supplies with no reply. Inform patient of message below. Patient verbalizes her frustration.     Per patient stated has been waiting for a week now and the supplier still have not received the prescription order. Per patient need her supplies right away. Per patient stated it happen every time when requesting refill/orders it takes 2-3 weeks.    Its not right to keep calling doctors office, begging to get her prescription.    Action required by office:    Per patient request urgent call back. Please advise     Best way to contact: [phone]  Alternative: none    Inquiry has been read verbatim to this caller. Verbalizes satisfaction and confirms the above is accurate: yes  Has been advised this message will be transmitted to office and can expect a response within the next 24-72 hours.

## 2022-08-22 ENCOUNTER — Encounter (HOSPITAL_BASED_OUTPATIENT_CLINIC_OR_DEPARTMENT_OTHER): Payer: Self-pay

## 2022-08-22 NOTE — Telephone Encounter (Signed)
Faxed back and placed in lower left file drawer of this nurses desk.

## 2022-08-22 NOTE — Interdisciplinary (Signed)
Patient to hold metformin day of procedure 8/22

## 2022-08-23 NOTE — Addendum Note (Signed)
Encounter addended by: Ladoris Gene on: 08/23/2022 9:24 AM   Actions taken: Flowsheet accepted, Charge Capture section accepted

## 2022-08-26 ENCOUNTER — Telehealth (INDEPENDENT_AMBULATORY_CARE_PROVIDER_SITE_OTHER): Payer: Self-pay

## 2022-08-26 ENCOUNTER — Encounter (INDEPENDENT_AMBULATORY_CARE_PROVIDER_SITE_OTHER): Payer: Self-pay | Admitting: Nurse Practitioner

## 2022-08-26 ENCOUNTER — Ambulatory Visit (INDEPENDENT_AMBULATORY_CARE_PROVIDER_SITE_OTHER): Payer: Medicare Other | Admitting: Nurse Practitioner

## 2022-08-26 VITALS — BP 121/69 | HR 90 | Temp 97.4°F | Resp 16 | Ht 63.0 in | Wt 141.0 lb

## 2022-08-26 DIAGNOSIS — J479 Bronchiectasis, uncomplicated: Secondary | ICD-10-CM

## 2022-08-26 DIAGNOSIS — A310 Pulmonary mycobacterial infection: Secondary | ICD-10-CM

## 2022-08-26 DIAGNOSIS — J984 Other disorders of lung: Secondary | ICD-10-CM

## 2022-08-26 DIAGNOSIS — A319 Mycobacterial infection, unspecified: Secondary | ICD-10-CM

## 2022-08-26 NOTE — Telephone Encounter (Signed)
GENERAL INQUIRY:    Caller Information:  Who is calling? Ancillary: Hope from [facility]  is calling on behalf of patient.  Best way to contact: [phone]  What is the reason for the call? Hope is calling stating for that was received on 08/22/22 is incorrect form. Hope stated will be refaxing for today and form will have [id number] and 8 items total. This has been the 4th attempt and is delaying patient care. Hope stated please look out for form will be faxing over today and to please have is completed signed and dated and faxed back to [phone].   Please Assist   Encounter Details:  Is this a duplicate encounter?: No previous documentation found on this issue.  Action required by the office: Please process request  Patient Information:  Is MyChart active?: Yes.  Verification:  Has the inquiry been read verbatim to the caller and verbalizes satisfaction and confirms the above is accurate?: Yes  Caller has been advised this message will be transmitted to the office and can expect a response within the next 24-72 hours during working business days.  Encounter created by Val Riles from the Care Assist Team.  Signature generated from controlled access password on August 26, 2022 at 9:55 AM.

## 2022-08-26 NOTE — Patient Instructions (Signed)
1) Please continue with Anoro once a day   2) Please resume air way clearance once a day  3) Please follow up in 2 months

## 2022-08-27 ENCOUNTER — Encounter (HOSPITAL_BASED_OUTPATIENT_CLINIC_OR_DEPARTMENT_OTHER): Payer: Self-pay | Admitting: General Surgery

## 2022-08-27 ENCOUNTER — Ambulatory Visit: Payer: Medicare Other | Attending: General Surgery | Admitting: General Surgery

## 2022-08-27 VITALS — BP 116/67 | HR 82 | Temp 97.1°F | Resp 16 | Wt 140.0 lb

## 2022-08-27 DIAGNOSIS — K219 Gastro-esophageal reflux disease without esophagitis: Secondary | ICD-10-CM

## 2022-08-27 NOTE — Telephone Encounter (Signed)
Patient is calling to check on the status of the message below has attempted 4 different times.Patient is very upset and would like a phone call regarding this issue.

## 2022-08-28 ENCOUNTER — Telehealth (HOSPITAL_BASED_OUTPATIENT_CLINIC_OR_DEPARTMENT_OTHER): Payer: Self-pay | Admitting: Critical Care Medicine

## 2022-08-28 NOTE — Telephone Encounter (Signed)
Patient called in to schedule a 2 month follow up with Dr.Elmaraachli. Agent secured first available appointment for January 2025, and added patient to wait list. Patient was instructed to schedule follow up in October.    Patient is requesting a callback to possibly schedule sooner appointment.    please assist,thank you   CB# [phone]

## 2022-08-28 NOTE — Telephone Encounter (Signed)
Called [facility] [employer] and informed them we have not receive the forms. Gave them fax # [phone]. Will fax over form again

## 2022-08-28 NOTE — Anesthesia Preprocedure Evaluation (Addendum)
ANESTHESIA PRE-OPERATIVE EVALUATION    Patient Information    Name: Tiffany Chen    MRN: [id number]    DOB: August 14, 1945    Age: 77 year old    Sex: female  Procedure(s):  Flexible bronchoscpy, Endobronchial ultrasound with sampling,Transbronchial needle aspiration, and Bronchoalveolar lavage. Possible brushing, biopsy, and/or chest tube placement. - Potentially 4+ station EBUS TBNA.  With multi-site BAL.      Pre-op Vitals:   There were no vitals taken for this visit.        Primary language spoken:  English    ROS/Medical History:      History of Present Illness: Tiffany Chen is a 77 year old female with history of bronchiectasis, cavitary MAC lung infection, ruptured diverticulitis s/p colostomy in 2009, DM, ICH s/p embolization. She is here to follow up on bronchiectasis and MAC lung infection with mediastinal lymphadenopathy. S/f bronch to evaluate lymphadenopathy   pmhx: history of bronchiectasis, cavitary MAC lung infection, ruptured diverticulitis s/p colostomy 2009, ICH s/p embolization, gerd , tremors, hld, niddm  A1c 6.7%      General:  negative for General ROS  Mets=4 Cardiovascular:  no CAD/Angina/MI/CABG/Stents, Anti-platelet drugs: No  hypertension,  patients denies, cp, sob, MI, stents, DOE, recent URI sx's    Anesthesia History:  negative anesthesia history ROS   Pulmonary:   no sleep apnea,  no recent URI/pneumonia,  H/o s/p Robotic right upper lobectomy with en bloc lower and middle lobe wedge resection, middle lobe wedge resection    Acquired tracheobronchomegaly with bronchiectasis   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 with en bloc lower and middle lobe wedge resection, middle lobe wedge resection    Completed all abx and lung treatments as of 06/08/22       Neuro/Psych:   negative for TIA/CVA,  no seizures,  psychiatric history (depression),  Tremors    Ich --s/p embolization,sp craniotomy  Hematology/Oncology:   history of cancer (skin),      GI/Hepatic:  GERD, well  controlled,  no bowel prep,   Infectious Disease:  negative for infectious disease     Renal:  negative renal ROS   Endocrine/Other:  diabetes, oral meds only, DM II,      Pregnancy History:   Pediatrics:         Pre Anesthesia Testing (PCC/CPC) notes/comments:    [facility] Test & records reviewed by [facility] Provider.                                      Physical Exam    Airway:     Inter-inciser distance 3-4 cm      Mallampati: II  Neck ROM: full  TM distance: 5-6 cm            Cardiovascular:      Rhythm: regular   Rate: normal          Pulmonary:         breath sounds clear to auscultation          Neuro/Neck/Skeletal/Skin:      Dental:      Abdominal:   - normal exam           Additional Clinical Notes:             Last OSA (STOP BANG) Score:   No data recorded      Past Medical History:  Diagnosis Date   . Acquired tracheobronchomegaly with bronchiectasis (CMS-HCC) 05/22/2020   . Age-related nuclear cataract of left eye 10/15/2021   . Cavitary lesion of lung 07/24/2020   . Combined forms of age-related cataract of both eyes 10/15/2021   . Depression    . DM (diabetes mellitus) (CMS-HCC)    . Gastroesophageal reflux disease, unspecified whether esophagitis present 10/12/2020    Added automatically from request for surgery [id number]   . 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     Past Surgical History:   Procedure Laterality Date   . BRONCHOSCOPY  12/2020   . CESAREAN SECTION, LOW TRANSVERSE     . COLOSTOMY     . CRANIOTOMY     . LUNG LOBECTOMY        Social History     Socioeconomic History   . Marital status: Widowed   Tobacco Use   . Smoking status: Never   . Smokeless tobacco: Never   Vaping Use   . Vaping status: Never Used   Substance and Sexual Activity   . Alcohol use: Yes     Comment: glass of wine every other month   . Drug use: Not Currently   . Sexual activity: Not Currently   Other Topics Concern   . Military Service No   . Blood Transfusions No   . Caffeine Concern No   . Occupational Exposure No   . Hobby Hazards Yes     Comment: Gardening, MAC   . Sleep Concern No   . Stress Concern Yes     Comment: Husband death Cov62, 09-16-2018, sold home, rehomed pets. [place]   . Weight Concern No   . Special Diet Yes   . Back Care No   . Exercises Regularly Yes   . Bike Helmet Use No   . Seat Belt Use No   . Performs Self-Exams No   Social History Narrative    Lives with brother Tiffany Chen at home. Sister also lives in [facility]     Social Determinants of Health     Food Insecurity: No Food Insecurity (07/09/2022)    Hunger Vital Sign    . Worried About Programme researcher, broadcasting/film/video in the Last Year: Never true    . Ran Out of Food in the Last Year: Never true   Transportation Needs: No Transportation Needs (07/09/2022)    PRAPARE - Transportation    . Lack of Transportation (Medical): No    . Lack of Transportation (Non-Medical): No   Intimate Partner Violence: Low Risk  (03/25/2022)    [place] IPV    . Have you ever been emotionally or physically abused by your partner or someone important to you?: No    . Within the last year have you been hit, slapped, kicked or otherwise physically hurt by someone?: No    . Within the last year has anyone forced you to have sexual activities?: No    . Are you afraid of your spouse or partner you listed above?: No   Housing Stability: Unknown (07/09/2022)  Housing Stability Vital Sign    . Unable to Pay for Housing in the Last Year: No    . Unstable Housing in the Last Year: No     Alcohol Use: Not on file       No current facility-administered  medications for this encounter.     Current Outpatient Medications   Medication Sig Dispense Refill   . atorvastatin (LIPITOR) 20 MG tablet Take 1 tablet (20 mg) by mouth daily. 90 tablet 1   . biotin 1000 MCG tablet Take 1 tablet (1,000 mcg) by mouth daily.     . blood glucose meter PHARMACY: Please dispense insurance approved bluetooth enable meter. 1 each 0   . ferrous sulfate 325 (65 Fe) MG tablet Take 1 tablet (325 mg) by mouth daily.     . fluoxetine (PROZAC) 40 MG capsule Take 1 capsule (40 mg) by mouth daily. 90 capsule 3   . glucose blood test strip 1 strip by Other route every morning (before breakfast). 100 strip 2   . glucose blood test strip 1 strip by Other route 2 times daily (before meals). 100 strip 11   . lancets 1 each by Other route every morning (before breakfast). 100 each 2   . lancets 1 each by Other route 2 times daily (before meals). 100 Lancet. 5   . metFORMIN (GLUCOPHAGE) 500 mg tablet 500 mg q AM and 1000mg  q PM     . Multiple Vitamin (MULTIVITAMIN) TABS tablet Take 1 tablet by mouth daily.     [name] omeprazole (PRILOSEC) 20 MG capsule Take 1 capsule (20 mg) by mouth every morning (before breakfast). 90 capsule 3   . psyllium (METAMUCIL) WAFR Take 2 Wafers by mouth every 12 hours as needed (As needed to maintain ileostomy output <1.2L). 24 packet 0   . ramipril (ALTACE) 2.5 MG capsule Take 1 capsule (2.5 mg) by mouth daily. 90 capsule 3   . sodium chloride 7 % NEBU USE 1 VIAL VIA NEBULIZER  EVERY 12 HOURS 720 mL 3   . umeclidinium-vilanterol (ANORO ELLIPTA) 62.5-25 MCG/ACT inhaler Inhale 1 puff by mouth daily. 60 each 11   . VENTOLIN HFA 108 (90 Base) MCG/ACT inhaler USE 2 INHALATIONS BY MOUTH  EVERY 6 HOURS AS NEEDED FOR WHEEZING 72 g 3   . vitamin D3 125 MCG (5000 UT) capsule Take 1 capsule (5,000 Units) by mouth every 24 hours.     . vitamin E 400 UNIT capsule Take 1 capsule (400 Units) by mouth daily.       Allergies   Allergen Reactions   . Walnuts Swelling     Tongue swollen and burn        Labs and Other Data  Lab Results   Component Value Date    NA 134 (L) 08/05/2022    K 5.0 08/05/2022    CL 100 08/05/2022    BICARB 19 (L) 08/05/2022    BUN 18 08/05/2022    CREAT 1.03 (H) 08/05/2022    GLU 151 (H) 08/05/2022    [place] 10.0 08/05/2022     Lab Results   Component Value Date    AST 31 08/05/2022    ALT 35 (H) 08/05/2022    ALK 79 08/05/2022    TP 7.2 08/05/2022    ALB 4.3 08/05/2022    TBILI 0.61 08/05/2022    DBILI <0.2 07/17/2020     Lab Results   Component Value Date    WBC 6.2 08/05/2022  RBC 4.20 08/05/2022    HGB 13.0 08/05/2022    HCT 38.6 08/05/2022    MCV 91.9 08/05/2022    MCHC 33.7 08/05/2022    RDW 13.2 08/05/2022    PLT 247 08/05/2022    MPV 11.0 08/05/2022    SEG 70.6 08/05/2022    LYMPHS 17.4 08/05/2022    MONOS 8.1 08/05/2022    EOS 2.8 08/05/2022    BASOS 0.8 08/05/2022     No results found for: "INR", "PTT"  No results found for: "ARTPH", "ARTPO2", "ARTPCO2"    Anesthesia Plan:  Risks and Benefits of Anesthesia  I have personally performed an appropriate pre-anesthesia physical exam of the patient (including heart, lungs, and airway) prior to the anesthetic and reviewed the pertinent medical history, drug and allergy history, laboratory and imaging studies and consultations.   I have determined that the patient has had adequate assessment and testing.  I have validated the documentation of these elements of the patient exam and/or have made necessary changes to reflect my own observations during my pre-anesthesia exam.  Anesthetic techniques, invasive monitors, anesthetic drugs for induction, maintenance and post-operative analgesia, risks and alternatives have been explained to the patient and/or patient's representatives.    I have prescribed the anesthetic plan:         Planned anesthesia method: General         ASA 3 (Severe systemic disease)     Potential anesthesia problems identified and risks including but not limited to the following were discussed with patient and/or  patient's representative: Dental injury or sore throat, Patient declined further  discussion of risks of anesthesia, Adverse or allergic drug reaction, Recall, Ocular injury and Nerve injury        Planned monitoring method: Routine monitoring    Informed Consent:  Anesthetic plan and risks discussed with Patient.    Plan discussed with Surgeon, CRNA and Attending.

## 2022-08-28 NOTE — Telephone Encounter (Addendum)
Forms received. Signed by NP Roque Cash and faxed back. Received confirmation. Message sent to pt.    Form scanned

## 2022-08-28 NOTE — Telephone Encounter (Signed)
Updated form not yet received -- routing to resident staff to follow up please

## 2022-08-28 NOTE — Telephone Encounter (Addendum)
CALL BACK    Caller Information:  Who is calling? Patient   Best way to contact: Phone: [phone]  What is the reason for the call? Patient is calling back very frustrated being that the forms have not been faxed back yet and she will be out of her stoma supplies.   Informed patient that [facility] from [employer] did call in 8/19 to inform the wrong forms were sent and she was going to re-fax the correct form and as of this morning clinic staff has still not received the forms.   Patient states this has been on going since the 6th and she has gone through this with the pharmacy in the past as well and it took 3 weeks to receive her medications. Patient expresses that something is very wrong with the system and "you people" keep saying that forms are not received or wrong forms were received and sent. She states that this is a consistent- same- tortuous procedure, and that we keep saying the same excuses and no one ever calls her back to keep her informed on what's going, she also expresses that something is so screwed up in there.   Apologized to patient multiple times and also offered number to we listen as well but patient declined stating all they do is listen and never do anything.   Patient states she loves Dr.Cordova but told herself multiple times that she is going to switch being that she keeps having issues. She believes that we should not make the patient so angry to the point where she is making a fool of herself trying to get attention and that if she does not hear back from staff she will have to go into the clinic.   Patient is asking for a call back, even if there are no answers she does not want to be ignored anymore.     Encounter Details:  Is this a duplicate encounter?:  Yes, please refer to encounter dated 08/12/22  Action required by the office: Please contact caller  Patient Information:  Is MyChart active?: Yes.  Verification:  Has the inquiry been read verbatim to the caller and  verbalizes satisfaction and confirms the above is accurate?: Yes  Caller has been advised this message will be transmitted to the office and can expect a response within the next 24-72 hours during working business days.  Encounter created by Markus Jarvis from the Care Assist Team.  Signature generated from controlled access password on August 28, 2022 at 9:40 AM.

## 2022-08-29 ENCOUNTER — Ambulatory Visit (HOSPITAL_BASED_OUTPATIENT_CLINIC_OR_DEPARTMENT_OTHER): Payer: Medicare Other

## 2022-08-29 ENCOUNTER — Encounter (HOSPITAL_COMMUNITY): Admission: RE | Disposition: A | Discharge status: Home Health | Payer: Self-pay | Attending: Internal Medicine

## 2022-08-29 ENCOUNTER — Encounter (HOSPITAL_BASED_OUTPATIENT_CLINIC_OR_DEPARTMENT_OTHER): Payer: Self-pay | Admitting: Internal Medicine

## 2022-08-29 ENCOUNTER — Ambulatory Visit (HOSPITAL_BASED_OUTPATIENT_CLINIC_OR_DEPARTMENT_OTHER): Payer: Medicare Other | Admitting: Anesthesiology

## 2022-08-29 ENCOUNTER — Ambulatory Visit
Admission: RE | Admit: 2022-08-29 | Discharge: 2022-08-29 | Disposition: A | Discharge status: Home / Self Care | Payer: Medicare Other | Attending: Internal Medicine | Admitting: Internal Medicine

## 2022-08-29 ENCOUNTER — Telehealth (INDEPENDENT_AMBULATORY_CARE_PROVIDER_SITE_OTHER): Payer: Self-pay

## 2022-08-29 ENCOUNTER — Encounter (INDEPENDENT_AMBULATORY_CARE_PROVIDER_SITE_OTHER): Payer: Self-pay

## 2022-08-29 DIAGNOSIS — Z79899 Other long term (current) drug therapy: Secondary | ICD-10-CM | POA: Insufficient documentation

## 2022-08-29 DIAGNOSIS — E119 Type 2 diabetes mellitus without complications: Secondary | ICD-10-CM

## 2022-08-29 DIAGNOSIS — Z7984 Long term (current) use of oral hypoglycemic drugs: Secondary | ICD-10-CM | POA: Insufficient documentation

## 2022-08-29 DIAGNOSIS — J8410 Pulmonary fibrosis, unspecified: Secondary | ICD-10-CM

## 2022-08-29 DIAGNOSIS — Z85828 Personal history of other malignant neoplasm of skin: Secondary | ICD-10-CM

## 2022-08-29 DIAGNOSIS — K219 Gastro-esophageal reflux disease without esophagitis: Secondary | ICD-10-CM

## 2022-08-29 DIAGNOSIS — I10 Essential (primary) hypertension: Secondary | ICD-10-CM | POA: Insufficient documentation

## 2022-08-29 DIAGNOSIS — E785 Hyperlipidemia, unspecified: Secondary | ICD-10-CM | POA: Insufficient documentation

## 2022-08-29 DIAGNOSIS — R590 Localized enlarged lymph nodes: Secondary | ICD-10-CM

## 2022-08-29 DIAGNOSIS — R918 Other nonspecific abnormal finding of lung field: Secondary | ICD-10-CM

## 2022-08-29 DIAGNOSIS — F32A Depression, unspecified: Secondary | ICD-10-CM | POA: Insufficient documentation

## 2022-08-29 DIAGNOSIS — Z933 Colostomy status: Secondary | ICD-10-CM | POA: Insufficient documentation

## 2022-08-29 LAB — PNEUMONIA PATHOGENS NUCLEIC ACID DETECTION TEST
(NON-COVID-19) Coronavirus PCR, BAL: NOT DETECTED
Acinetobacter calcoaceticus-baumannii complex PCR, BAL: NOT DETECTED
Adenovirus PCR, BAL: NOT DETECTED
Chlamydophila (Chlamydia) pneumoniae PCR, BAL: NOT DETECTED
Enterobacter cloacae complex PCR, BAL: NOT DETECTED
Escherichia coli PCR, BAL: NOT DETECTED
Haemophilus influenzae PCR, BAL: NOT DETECTED
Human Metapneumovirus PCR, BAL: NOT DETECTED
Human Rhinovirus/Entrovirus PCR, BAL: NOT DETECTED
Influenza A Virus PCR, BAL: NOT DETECTED
Influenza B Virus PCR, BAL: NOT DETECTED
Klebsiella aerogenes PCR, BAL: NOT DETECTED
Klebsiella oxytoca PCR, BAL: NOT DETECTED
Klebsiella pneumoniae complex PCR, BAL: NOT DETECTED
Legionella pneumophila PCR, BAL: NOT DETECTED
Moraxella catarrhalis PCR, BAL: NOT DETECTED
Mycoplasma pneumoniae PCR, BAL: NOT DETECTED
Parainfluenza virus PCR, BAL: NOT DETECTED
Proteus species PCR, BAL: NOT DETECTED
Pseudomonas aeruginosa PCR, Bal: NOT DETECTED
Respiratory syncytial virus PCR, BAL: NOT DETECTED
Serratia marcescens PCR, BAL: NOT DETECTED
Staphylococcus aureus PCR, BAL: NOT DETECTED
Streptococcus agalactiae (Group B) PCR, BAL: NOT DETECTED
Streptococcus pneumoniae PCR, BAL: NOT DETECTED
Streptococcus pyogenes (Group A) PCR, BAL: NOT DETECTED

## 2022-08-29 LAB — GLUCOSE (POCT)
Glucose (POCT): 110 mg/dL — ABNORMAL HIGH (ref 70–99)
Glucose (POCT): 111 mg/dL — ABNORMAL HIGH (ref 70–99)

## 2022-08-29 LAB — BODY FLUID CELL CT / DIFF
Fluid Number of Total Nucleated Cell Counted: 100
Fluid Other Non Hematogenous Cells: 8 %
Macrophages fluid %: 22 %
Manual Fluid Total Nucleated Cell Count: 31 uL
Neutrophils Fluid %: 70 %

## 2022-08-29 SURGERY — BRONCHOSCOPY, WITH ENDOBRONCHIAL ULTRASOUND (EBUS) GUIDED SAMPLING
Anesthesia: General | Site: Bronchus

## 2022-08-29 MED ORDER — SUGAMMADEX SODIUM 200 MG/2ML IV SOLN
INTRAVENOUS | Status: DC | PRN
Start: 2022-08-29 — End: 2022-08-29
  Administered 2022-08-29: 250 mg via INTRAVENOUS

## 2022-08-29 MED ORDER — DEXAMETHASONE SODIUM PHOSPHATE 4 MG/ML IJ SOLN (CUSTOM)
INTRAMUSCULAR | Status: DC | PRN
Start: 2022-08-29 — End: 2022-08-29
  Administered 2022-08-29: 4 mg via INTRAVENOUS

## 2022-08-29 MED ORDER — MENTHOL 3 MG MT LOZG
1.0000 | LOZENGE | OROMUCOSAL | Status: DC | PRN
Start: 2022-08-29 — End: 2022-08-29

## 2022-08-29 MED ORDER — FENTANYL CITRATE (PF) 100 MCG/2ML IJ SOLN
INTRAMUSCULAR | Status: AC
Start: 2022-08-29 — End: 2022-08-29
  Filled 2022-08-29: qty 4

## 2022-08-29 MED ORDER — EPINEPHRINE 1 MG/10ML IJ SOSY
PREFILLED_SYRINGE | INTRAMUSCULAR | Status: AC
Start: 2022-08-29 — End: 2022-08-29
  Filled 2022-08-29: qty 10

## 2022-08-29 MED ORDER — FENTANYL CITRATE (PF) 50 MCG/ML IJ SOLN (WRAPPED RECORD) ~~LOC~~
25.0000 ug | INTRAMUSCULAR | Status: DC | PRN
Start: 2022-08-29 — End: 2022-08-29

## 2022-08-29 MED ORDER — LIDOCAINE HCL 2 % IJ SOLN WRAPPED RECORD
INTRAMUSCULAR | Status: DC | PRN
Start: 2022-08-29 — End: 2022-08-29
  Administered 2022-08-29: 12 mL
  Administered 2022-08-29 (×2): 2 mL
  Administered 2022-08-29: 4 mL

## 2022-08-29 MED ORDER — LACTATED RINGERS IV SOLN
INTRAVENOUS | Status: DC | PRN
Start: 2022-08-29 — End: 2022-08-29

## 2022-08-29 MED ORDER — ONDANSETRON HCL 4 MG/2ML IV SOLN
4.0000 mg | Freq: Once | INTRAMUSCULAR | Status: DC | PRN
Start: 2022-08-29 — End: 2022-08-29

## 2022-08-29 MED ORDER — ROCURONIUM BROMIDE 100 MG/10ML IV SOLN
INTRAVENOUS | Status: DC | PRN
Start: 2022-08-29 — End: 2022-08-29
  Administered 2022-08-29: 60 mg via INTRAVENOUS

## 2022-08-29 MED ORDER — PROPOFOL 1000 MG/100ML IV EMUL
INTRAVENOUS | Status: DC | PRN
Start: 2022-08-29 — End: 2022-08-29
  Administered 2022-08-29: 100 ug/kg/min via INTRAVENOUS
  Administered 2022-08-29: 125 ug/kg/min via INTRAVENOUS

## 2022-08-29 MED ORDER — ESMOLOL HCL 100 MG/10ML IV SOLN
INTRAVENOUS | Status: DC | PRN
Start: 2022-08-29 — End: 2022-08-29
  Administered 2022-08-29: 20 mg via INTRAVENOUS

## 2022-08-29 MED ORDER — TRANEXAMIC ACID 1000 MG/10 ML INJECTION SOLN FOR INTRATRACHEAL USE (~~LOC~~)
INTRATRACHEAL | Status: DC | PRN
Start: 2022-08-29 — End: 2022-08-29
  Administered 2022-08-29 (×3): 200 mg via INTRATRACHEAL

## 2022-08-29 MED ORDER — ALBUTEROL SULFATE (5 MG/ML) 0.5% IN NEBU
2.5000 mg | INHALATION_SOLUTION | Freq: Once | RESPIRATORY_TRACT | Status: AC
Start: 2022-08-29 — End: 2022-08-29
  Administered 2022-08-29: 2.5 mg via RESPIRATORY_TRACT
  Filled 2022-08-29: qty 0.5

## 2022-08-29 MED ORDER — ACETAMINOPHEN 10 MG/ML IV SOLN
1000.0000 mg | Freq: Once | INTRAVENOUS | Status: DC
Start: 2022-08-29 — End: 2022-08-29

## 2022-08-29 MED ORDER — PHENYLEPHRINE HCL-NACL 25-0.9 MG/250ML-% IV SOLN
INTRAVENOUS | Status: DC | PRN
Start: 2022-08-29 — End: 2022-08-29
  Administered 2022-08-29: 25 ug/min via INTRAVENOUS
  Administered 2022-08-29: 15 ug/min via INTRAVENOUS

## 2022-08-29 MED ORDER — METOPROLOL TARTRATE 5 MG/5ML IV SOLN
INTRAVENOUS | Status: DC | PRN
Start: 2022-08-29 — End: 2022-08-29
  Administered 2022-08-29: 1 mg via INTRAVENOUS

## 2022-08-29 MED ORDER — LIDOCAINE HCL 2 % IJ SOLN WRAPPED RECORD
INTRAMUSCULAR | Status: AC
Start: 2022-08-29 — End: 2022-08-29
  Filled 2022-08-29: qty 20

## 2022-08-29 MED ORDER — ONDANSETRON HCL 4 MG/2ML IV SOLN
INTRAMUSCULAR | Status: DC | PRN
Start: 2022-08-29 — End: 2022-08-29
  Administered 2022-08-29: 4 mg via INTRAVENOUS

## 2022-08-29 MED ORDER — PROPOFOL 200 MG/20ML IV EMUL
INTRAVENOUS | Status: DC | PRN
Start: 2022-08-29 — End: 2022-08-29
  Administered 2022-08-29: 80 mg via INTRAVENOUS

## 2022-08-29 MED ORDER — LIDOCAINE HCL 2 % IJ SOLN WRAPPED RECORD
INTRAMUSCULAR | Status: DC | PRN
Start: 2022-08-29 — End: 2022-08-29
  Administered 2022-08-29: 100 mg via INTRATHECAL

## 2022-08-29 MED ORDER — MIDAZOLAM HCL 2 MG/2ML IJ SOLN
INTRAMUSCULAR | Status: DC | PRN
Start: 2022-08-29 — End: 2022-08-29
  Administered 2022-08-29: 4 mg via INTRAVENOUS

## 2022-08-29 MED ORDER — NALOXONE HCL 0.4 MG/ML IJ SOLN
0.1000 mg | INTRAMUSCULAR | Status: DC | PRN
Start: 2022-08-29 — End: 2022-08-29

## 2022-08-29 MED ORDER — TRANEXAMIC ACID 1000 MG/10ML IV SOLN
INTRAVENOUS | Status: AC
Start: 2022-08-29 — End: 2022-08-29
  Filled 2022-08-29: qty 10

## 2022-08-29 MED ORDER — LACTATED RINGERS IV SOLN
INTRAVENOUS | Status: DC
Start: 2022-08-29 — End: 2022-08-29

## 2022-08-29 MED ORDER — FENTANYL CITRATE (PF) 250 MCG/5ML IJ SOLN
INTRAMUSCULAR | Status: DC | PRN
Start: 2022-08-29 — End: 2022-08-29
  Administered 2022-08-29: 200 ug via INTRAVENOUS

## 2022-08-29 MED ORDER — DIPHENHYDRAMINE HCL 50 MG/ML IJ SOLN
12.5000 mg | Freq: Once | INTRAMUSCULAR | Status: DC | PRN
Start: 2022-08-29 — End: 2022-08-29

## 2022-08-29 MED ORDER — MIDAZOLAM HCL 2 MG/2ML IJ SOLN
INTRAMUSCULAR | Status: AC
Start: 2022-08-29 — End: 2022-08-29
  Filled 2022-08-29: qty 4

## 2022-08-29 SURGICAL SUPPLY — 53 items
BALLOON EBUS (Misc Surgical Supply) ×1 IMPLANT
BOWL SPONGE STRL 16OZ (Misc Medical Supply) ×1 IMPLANT
BRUSH HARRELL CYTOLOGY #4 (Misc Medical Supply) IMPLANT
CANISTER SUCTION 1200CC (Tubing/Suction) ×2 IMPLANT
CANNULA NASAL FLARED W/7' TUBE (Misc Medical Supply) IMPLANT
CONNECTOR FLUID DISPENSING (Misc Medical Supply) ×2 IMPLANT
CONTAINER PRECISION SPECIMEN, 4OZ- STERILE (Misc Medical Supply) ×2 IMPLANT
CONTAINER PREFIL FORMALIN 60ML (Misc Medical Supply) ×2 IMPLANT
DRAPE HALF SHEET MEDIUM (Drapes/towels) ×1 IMPLANT
DRESSING SPONGE CURITY 4X4 16 PLY (Dressings/packing) ×1 IMPLANT
EXTENDER TRACHEA, STERILE (Misc Medical Supply) IMPLANT
FLEXIBLE CRYOPROBE SINGLE USE 1.1MM X 1150MM W/OVERSHEATH LENGTH 817MM BOX/5 (Misc Medical Supply) ×1 IMPLANT
FLUID SILICONE .5 OZ (Misc Medical Supply) IMPLANT
FORCEP BIOPSY ALLIGATOR JAW (Misc Medical Supply) ×1 IMPLANT
FORCEP BIOPSY RADIAL JAW 3 PULMONARY STD 2MM X 100CM (Needles/punch/cannula/biopsy) IMPLANT
FORCEPSPRECISOR BRONCH DISPOSABLE ALLIGATOR CUP (Misc Medical Supply) IMPLANT
GLOVE BIOGEL PI ULTRATOUCH SIZE 7.5 (Gloves/Gowns) IMPLANT
GLOVE BIOGEL PI ULTRATOUCH SIZE 8 (Gloves/Gowns) IMPLANT
GLOVE BIOGEL SUPER-SENSITIVE SIZE 7.5 (Gloves/Gowns) IMPLANT
GLOVE SKINSENSE PF LF SZ 6.5 (Gloves/Gowns) IMPLANT
GLOVE SURGICAL BIOGEL SIZE 8 (Gloves/Gowns) IMPLANT
INTERSURG GREEN ADAPTOR AKA CONNECTOR TRACH SWIVEL (Misc Medical Supply) IMPLANT
MASK ADULT TRACH AEROSOL (Anesthesia Supply) IMPLANT
MASK OXYGEN ADULT SOFT DSP LF (Anesthesia Supply) IMPLANT
NEEDLE ASPIRATION EBUS SINGLE-USE 21 GAUGE (NEWER VERSION) (Needles/punch/cannula/biopsy) IMPLANT
NEEDLE ASPIRATION SINGLE USE 19GA (Needles/punch/cannula/biopsy) IMPLANT
NEEDLE ASPIRATION SINGLE USE 21 GAUGE (Needles/punch/cannula/biopsy) IMPLANT
NEEDLE ASPIRATION SMOOTHSHOT 21 GAUGE (Needles/punch/cannula/biopsy) IMPLANT
NEEDLE BLUNT FILL 18G X 1.5" (Needles/punch/cannula/biopsy) IMPLANT
NEEDLE EBUS FNA EXPECT PULMONARY 25GA (Needles/punch/cannula/biopsy) IMPLANT
NEEDLE VIZISHOT EBUS 22G NA-201SX-4022-A (OLD VERSION) (Needles/punch/cannula/biopsy) ×2 IMPLANT
NEEDLE WANG TRANSBRONCH 21GA INNER 19G OUTER (Needles/punch/cannula/biopsy) IMPLANT
NEEDLELESS VIAL ACCESS (Needles/punch/cannula/biopsy) ×1 IMPLANT
SOLUTION IRR POUR BTL .9% N/S 500 (Non-Pharmacy Meds/Solutions) ×1 IMPLANT
SOLUTION IRR POUR BTL 0.9% NS 1000ML (Non-Pharmacy Meds/Solutions) IMPLANT
SPONGE ENDO-SCRUB (Misc Medical Supply) ×1 IMPLANT
STOPCOCK 3-WAY W/SWIVEL MALE LUER LOCK (Needles/punch/cannula/biopsy) ×1 IMPLANT
SYRINGE HYPO LL 20CC (Needles/punch/cannula/biopsy) ×1 IMPLANT
SYRINGE HYPO LL 3CC (Needles/punch/cannula/biopsy) IMPLANT
SYRINGE HYPO LL 5CC (Needles/punch/cannula/biopsy) IMPLANT
SYRINGE HYPO LS 10CC (Needles/punch/cannula/biopsy) ×2 IMPLANT
SYRINGE HYPO LS 20CC (Needles/punch/cannula/biopsy) ×4 IMPLANT
SYRINGE HYPO LS 5CC (Needles/punch/cannula/biopsy) IMPLANT
SYRINGE HYPO LS 60CC (Needles/punch/cannula/biopsy) IMPLANT
TIP NEEDLE SUPERTRAX CYTOLOGY BRUSH (Misc Medical Supply) IMPLANT
TOWELS OR BLUE 4-PACK STERILE, DISPOSABLE (Drapes/towels) IMPLANT
TRAP LUKENS SPECIMEN 40ML STERILE (Misc Medical Supply) IMPLANT
TRAP SPECIMEN 70CC LTX (Misc Medical Supply) ×3 IMPLANT
TUBING CONN SUCTN 1/4 X 6 (Tubing/Suction) ×4 IMPLANT
TUBING CORR-A-FLEX CORRUGATED 72" (Tubing/Suction) IMPLANT
VALVE BIOPSY OLYMPUS MAJ-210- SINGLE USE (Tubing/Suction) ×1 IMPLANT
VALVE SUCTION OLYMPUS- SINGLE USE (Tubing/Suction) ×2 IMPLANT
WARMER SCOPE 15" PRE SURG (Misc Surgical Supply) IMPLANT

## 2022-08-29 NOTE — Telephone Encounter (Signed)
New request as another item was added. Will wait for fax

## 2022-08-29 NOTE — Telephone Encounter (Signed)
Fax printed.

## 2022-08-29 NOTE — Telephone Encounter (Signed)
Form signed by NP Roque Cash, faxed and scanned to pt's chart.  Confirmation received that fax went through

## 2022-08-29 NOTE — H&P (Signed)
PRE-PROCEDURE HISTORY AND PHYSICAL     Tiffany Chen presents for her scheduled Procedure(s):  Flexible bronchoscpy, Endobronchial ultrasound with sampling,Transbronchial needle aspiration, and Bronchoalveolar lavage. Possible brushing, biopsy, and/or chest tube placement..    The indication for the procedure(s) is Mediastinal lymphadenopathy.    There have been no significant recent changes in the patient's medical status.    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     Past Surgical History:   Procedure Laterality Date    BRONCHOSCOPY  12/2020    CESAREAN SECTION, LOW TRANSVERSE      COLOSTOMY      CRANIOTOMY      LUNG LOBECTOMY         Allergies  Allergies   Allergen Reactions    Walnuts Swelling     Tongue swollen and burn       Medications  No current facility-administered medications on file prior to encounter.     Current Outpatient Medications on File Prior to Encounter   Medication Sig Dispense Refill    atorvastatin (LIPITOR) 20 MG tablet Take 1 tablet (20 mg) by mouth daily.  90 tablet 1    biotin 1000 MCG tablet Take 1 tablet (1,000 mcg) by mouth daily.      blood glucose meter PHARMACY: Please dispense insurance approved bluetooth enable meter. 1 each 0    ferrous sulfate 325 (65 Fe) MG tablet Take 1 tablet (325 mg) by mouth daily.      fluoxetine (PROZAC) 40 MG capsule Take 1 capsule (40 mg) by mouth daily. 90 capsule 3    glucose blood test strip 1 strip by Other route every morning (before breakfast). 100 strip 2    glucose blood test strip 1 strip by Other route 2 times daily (before meals). 100 strip 11    lancets 1 each by Other route every morning (before breakfast). 100 each 2    lancets 1 each by Other route 2 times daily (before meals). 100 Lancet. 5    metFORMIN (GLUCOPHAGE) 500 mg tablet 500 mg q AM and 1000mg  q PM      Multiple Vitamin (MULTIVITAMIN) TABS tablet Take 1 tablet by mouth daily.      omeprazole (PRILOSEC) 20 MG capsule Take 1 capsule (20 mg) by mouth every morning (before breakfast). 90 capsule 3    psyllium (METAMUCIL) WAFR Take 2 Wafers by mouth every 12 hours as needed (As needed to maintain ileostomy output <1.2L). 24 packet 0    ramipril (ALTACE) 2.5 MG capsule Take 1 capsule (2.5 mg) by mouth daily. 90 capsule 3  sodium chloride 7 % NEBU USE 1 VIAL VIA NEBULIZER  EVERY 12 HOURS 720 mL 3    umeclidinium-vilanterol (ANORO ELLIPTA) 62.5-25 MCG/ACT inhaler Inhale 1 puff by mouth daily. 60 each 11    VENTOLIN HFA 108 (90 Base) MCG/ACT inhaler USE 2 INHALATIONS BY MOUTH  EVERY 6 HOURS AS NEEDED FOR WHEEZING 72 g 3    vitamin D3 125 MCG (5000 UT) capsule Take 1 capsule (5,000 Units) by mouth every 24 hours.      vitamin E 400 UNIT capsule Take 1 capsule (400 Units) by mouth daily.         Physical Examination  BP 110/70   Pulse 70   Temp 98.2 F (36.8 C)   Resp 18   Ht 5\' 3"  (1.6 m)   Wt 63.5 kg (140 lb)   SpO2 99%   BMI 24.80 kg/m   Body mass index is 24.8 kg/m.  ASA Status: 3 - Moderate to severe systemic disease that limits activity but not  incapacitating  Mental Status: alert  Lungs: No acute distress at rest    LABS:  CBC:  Lab Results   Component Value Date    WBC 6.2 08/05/2022    HGB 13.0 08/05/2022    HCT 38.6 08/05/2022    PLT 247 08/05/2022     CHEM:  Lab Results   Component Value Date    NA 134 (L) 08/05/2022    K 5.0 08/05/2022    CL 100 08/05/2022    BICARB 19 (L) 08/05/2022    BUN 18 08/05/2022    CREAT 1.03 (H) 08/05/2022    GLU 151 (H) 08/05/2022    [place] 10.0 08/05/2022     COAG:  No results found for: "PT", "PTT", "INR"  LFTs:  Lab Results   Component Value Date    AST 31 08/05/2022    ALT 35 (H) 08/05/2022    ALK 79 08/05/2022    TP 7.2 08/05/2022    ALB 4.3 08/05/2022    TBILI 0.61 08/05/2022    DBILI <0.2 07/17/2020        ASSESSMENT AND PLAN   Tiffany Chen has been evaluated and deemed appropriate to undergo the planned Procedure(s):  Flexible bronchoscpy, Endobronchial ultrasound with sampling,Transbronchial needle aspiration, and Bronchoalveolar lavage. Possible brushing, biopsy, and/or chest tube placement.      Charlyne Quale, MD  08/29/2022

## 2022-08-29 NOTE — Progress Notes (Signed)
Minimally Invasive Surgery   Surgical planning visit  Date: August 29, 2022     Patient Name: Tiffany Chen   Medical Record #: [id number]   DOB: 08/27/1945  Age: 77 year old  Sex: female    Referring MD: [name], MD  [address]  [place],  [place] [address]    PCP: Monia Sabal    Reason for Visit:   No chief complaint on file.      History of Present Illness:     Tiffany Chen is a 77 year old female, Body mass index is 24.8 kg/m., who is here to discussed ostomy takedown and gastric fundoplication.     She has recovered well after colostomy takedown and open ventral hernia repair with mesh. She has not yet had antireflux surgery due to difficulty and hemodynamics during previous operation.     Very pleased with the outcome of her previous operation, though the recovery was difficult.    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 Chestine Spore in 03/2021 and there is some concern that the GERD is contributing to the MAC infections.     Was last seen in clinic on 6/6 at which time she was recommended to undergo barium swallow, manometry, and CT with IV and rectal contrast then return to clinic to discuss surgical options again.     Her manometry was found to be overall normal. Barium swallow not yet completed, but is scheduled for August 1st. Patient states that she continues to be asymptomatic from her reflux as long as she takes her PPI. She is overall doing well from a respiratory standpoint. 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 Would prefer not to undergo anti-reflux surgery unless indicated as treatment against MAC infections."    Have discussed with Dr. Rinaldo Ratel who highly recommends antireflux surgery to help prevent recurrent MAC.    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 Chestine Spore in 03/2021 for MAC    Allergies  Allergies  Allergen Reactions    Walnuts Swelling     Tongue swollen and burn       Medications  Current Outpatient Medications   Medication Sig Dispense Refill    atorvastatin (LIPITOR) 20 MG tablet Take 1 tablet (20 mg) by mouth daily. 90 tablet 1    biotin 1000 MCG tablet Take 1 tablet (1,000 mcg) by mouth daily.      blood glucose meter PHARMACY: Please dispense insurance approved bluetooth enable meter. 1 each 0    ferrous sulfate 325 (65 Fe) MG tablet Take 1 tablet (325 mg) by mouth daily.      fluoxetine (PROZAC) 40 MG capsule Take 1 capsule (40 mg) by mouth daily. 90 capsule 3    glucose blood test strip 1 strip by Other route every morning (before breakfast). 100 strip 2    glucose blood  test strip 1 strip by Other route 2 times daily (before meals). 100 strip 11    lancets 1 each by Other route every morning (before breakfast). 100 each 2    lancets 1 each by Other route 2 times daily (before meals). 100 Lancet. 5    metFORMIN (GLUCOPHAGE) 500 mg tablet 500 mg q AM and 1000mg  q PM      Multiple Vitamin (MULTIVITAMIN) TABS tablet Take 1 tablet by mouth daily.      omeprazole (PRILOSEC) 20 MG capsule Take 1 capsule (20 mg) by mouth every morning (before breakfast). 90 capsule 3    psyllium (METAMUCIL) WAFR Take 2 Wafers by mouth every 12 hours as needed (As needed to maintain ileostomy output <1.2L). 24 packet 0    ramipril (ALTACE) 2.5 MG capsule Take 1 capsule (2.5 mg) by mouth daily. 90 capsule 3    sodium chloride 7 % NEBU USE 1 VIAL VIA NEBULIZER  EVERY 12 HOURS 720 mL 3    umeclidinium-vilanterol (ANORO ELLIPTA) 62.5-25 MCG/ACT inhaler Inhale 1 puff by mouth daily. 60 each 11    VENTOLIN HFA 108 (90 Base) MCG/ACT inhaler USE 2 INHALATIONS BY MOUTH  EVERY 6 HOURS AS NEEDED FOR WHEEZING 72 g 3    vitamin D3 125 MCG (5000 UT) capsule Take 1 capsule (5,000 Units) by mouth every 24 hours.      vitamin E 400 UNIT capsule Take 1 capsule (400 Units) by mouth daily.       No current facility-administered medications for this visit.       Social History  Social History     Socioeconomic History    Marital status: Widowed   Tobacco Use    Smoking status: Never    Smokeless tobacco: Never   Vaping Use    Vaping status: Never Used   Substance and Sexual Activity    Alcohol use: Yes     Comment: glass of wine every other month    Drug use: Not Currently    Sexual activity: Not Currently   Social Activities of Daily Living Present    Military Service No    Blood Transfusions No    Caffeine Concern No    Occupational Exposure No    Hobby Hazards Yes     Comment: Gardening, MAC    Sleep Concern No    Stress Concern Yes     Comment: Husband death Cov34, October 06, 2018, sold home, rehomed pets. [place]    Weight Concern  No    Special Diet Yes    Back Care No    Exercises Regularly Yes    Bike  Helmet Use No    Seat Belt Use No    Performs Self-Exams No   Social History Narrative    Lives with brother Fayrene Fearing at home. Sister also lives in [place]       Family History  Family History   Problem Relation Name Age of Onset    Cancer Mother Tiffany Chen ngs and throat    Heart Disease Mother Tiffany Chen         Congrestive heart failure due to cancer    Hypertension Mother Tiffany Chen     Psychiatry Mother Tiffany Chen         Bi polar, manic depressive    Cancer Father Jamal Maes         Prostate    Diabetes Father Jamal Maes     Stroke Father Jamal Maes     Stroke M Grandmother Winifred Larita Fife     Cancer Brother Vallarie Mare         Skin (many locations)    Diabetes Brother Vallarie Mare        Review of Systems    As described in HPI.    Physical Examination  BP 116/67 (BP Location: Right arm, BP Patient Position: Sitting, BP cuff size: Regular)   Pulse 82   Temp 97.1 F (36.2 C) (Temporal)   Resp 16   Wt 63.5 kg (140 lb)   SpO2 97%   BMI 24.80 kg/m  Body mass index is 24.8 kg/m.  GENERAL: alert, oriented x 3, no acute distress   CHEST: unlabored respirations   ABDOMEN: soft, non tender, loop ileostomy ppp, midline incision and site of prior colostomy c/d/I without erythema nor drainage    Diagnostic Testing/Labs:  Lab Results   Component Value Date    WBC 6.2 08/05/2022    RBC 4.20 08/05/2022    HGB 13.0 08/05/2022    HCT 38.6 08/05/2022    MCV 91.9 08/05/2022    MCHC 33.7 08/05/2022    RDW 13.2 08/05/2022    PLT 247 08/05/2022    MPV 11.0 08/05/2022    SEG 70.6 08/05/2022    LYMPHS 17.4 08/05/2022    MONOS 8.1 08/05/2022    EOS 2.8 08/05/2022    BASOS 0.8 08/05/2022     Lab Results   Component Value Date    BUN 18 08/05/2022    CREAT 1.03 (H) 08/05/2022    CL 100 08/05/2022    NA 134 (L) 08/05/2022    K 5.0 08/05/2022    [place] 10.0 08/05/2022    TBILI 0.61 08/05/2022    ALB 4.3 08/05/2022    TP 7.2 08/05/2022    AST 31 08/05/2022     ALK 79 08/05/2022    BICARB 19 (L) 08/05/2022    ALT 35 (H) 08/05/2022    GLU 151 (H) 08/05/2022     Lab Results   Component Value Date    NA 134 (L) 08/05/2022    K 5.0 08/05/2022    CL 100 08/05/2022    BICARB 19 (L) 08/05/2022    BUN 18 08/05/2022    CREAT 1.03 (H) 08/05/2022    GLU 151 (H) 08/05/2022    [place] 10.0 08/05/2022     Lab Results   Component Value Date    A1C 6.2 (H) 08/05/2022     Lab Results   Component Value Date    CHOL 171 05/06/2022    HDL  61 05/06/2022    LDLCALC 85 05/06/2022    TRIG 124 05/06/2022     Lab Results   Component Value Date    TSH 3.38 08/05/2022         Imaging/Procedures:    Manometry completed 6/27  Barium swallow scheduled for 8/1    EGD with [facility]: 13.2% acid exposure with demeesters 40-60.      X-Ray Colon Single Contrast with KUB    Result Date: 08/20/2022  IMPRESSION: No leakage of contrast at the site of prior surgery.     PET/CT Skull Base To Mid Thigh (Non Diag CT For [facility])    Result Date: 07/23/2022  IMPRESSION: 1. PET/CT SCAN DEMONSTRATING several FDG avid mediastinal and bihilar nodes, all of which are likely disease involved. 2. No enlarged or FDG avid subdiaphragmatic adenopathy. 3. No FDG avid hepatic, splenic or osseous / marrow findings.     MR Angiogram Brain W/WO Contrast    Result Date: 07/22/2022  IMPRESSION: No radiographically significant change from 05/30/2021. No evidence of an acute aneurysm. No evidence of arterial stenosis. 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 Comparison with previous catheter angiography is recommended.       Assessment/Plan:  Alichia Omalley is a 77 year old female, Body mass index is 24.8 kg/m., with asymptomatic GERD with history of RUL MAC requiring lobectomy as well as a symptomatic parastomal hernia:    Discussed r/b/a to surgery in detail. She would like to pursue ostomy takedown.    I  will perform laparoscopic hiatal hernai repair and gastric fundoplication at time of this operation, as long as there are not adhesions or other intra-abdominal concerns that would make the operation unsafe to proceed. She understands and agrees. Consent signed in clinic.

## 2022-08-29 NOTE — Plan of Care (Signed)
Problem: Promotion of Perioperative Health and Safety  Goal: Promotion of Health and Safety of the Perioperative Patient  Description: The patient remains safe, receives treatment appropriate to the surgical intervention and patient's physiological needs and is discharged or transferred to the appropriate level of care.    Information below is the current care plan.  Outcome: Resolved  Flowsheets  Taken 08/29/2022 1611  Guidelines: PACU  Individualized Interventions/Recommendations #1: hemodynamically stable  Individualized Interventions/Recommendations #2 (if applicable): effective breathing  Individualized Interventions/Recommendations #3 (if applicable): pain management  Taken 08/29/2022 1555  Patient /Family stated Goal: i feel ok

## 2022-08-29 NOTE — Op Note (Signed)
INTERVENTIONAL PULMONOLOGY OPERATIVE REPORT     DATE OF PROCEDURE: 08/29/2022    CC Referred Physician:  [name], MD  [address]  [id number]  [place],  [place] [address]    INDICATION FOR OPERATION:  Tiffany Chen is a 77 year old-year-old female who presents with lymphadenopathy.  The nature, purpose, risks, benefits and alternatives to Bronchoscopy were discussed with the patient in detail.  Patient indicated a wish to proceed with surgery and informed consent was signed.    CONSENT : Obtained before the procedure. Its indications and potential complications and alternatives were discussed with the patient or surrogate. The patient or surrogate read and signed the provided consent form / provided consent over the phone. The consent was witnessed by an assisting medical professional.    PREOPERATIVE DIAGNOSIS: R59.0 Localized enlarged lymph nodes    POSTOPERATIVE DIAGNOSIS:  R59.0 Localized enlarged lymph nodes    PROCEDURE:    31645 Therapeutic aspiration initial episode  40981 Dx bronchoscope/cell washing            31624 Dx bronchoscope/lavage (BAL)      31625 Endobronchial Biopsy(s)  31653 EBUS sampling 3 or more nodes    31899ND BRONCHOSCOPY W/ APPLICATION OF TRANEXAMIC ACID    22 Substantially greater work than normal (i.e., increased intensity, time, technical difficulty of procedure, and severity of patient's condition, physical and mental effort required)    IP [place] CODE MOD DETAILS:   Unusual Procedure:  This patient required a EBUS lymph node forceps/cryo biopsies. This resulted in >50% increased work due to Increased intensity, Time, Technical difficulty of procedure, and Physical and mental effort required. Apply to: 19147 EBUS sampling 3 or more nodes  .     ATTENDING:    Loni Dolly MD    ASSISTANT:   -    Support Staff:   RN: Lorenza Cambridge and Loraine Leriche Dalugdugan  RT: Gerre Scull    ANESTHESIA:   General Anesthesia    MONITORING : Pulse oximetry, heart rate, telemetry, and BP were  continuously monitored by an independent trained observer that was present throughout the entire procedure.    INSTRUMENT :   Flexible Therapeutic Bronchoscope  Linear EBUS     ESTIMATED BLOOD LOSS:   Moderate    COMPLICATIONS: None    PROCEDURE IN DETAIL:  After the successful induction of anesthesia, a timeout was performed (confirming the patient's name, procedure type, and procedure location).    Initial Airway Inspection Findings:    Normal appearing airway anatomy and mucosa bilaterally to the segmental level except for the right upper lobe. The right upper lobectomy site was intact and stable appearing. Lidocaine was applied.    Successful therapeutic aspiration was performed to clean out the Trachea (Proximal 1/3), Trachea (Middle 1/3), Trachea (Distal 1/3), Right Mainstem, Bronchus Intermedius , Left Mainstem, Carina, RUL Carina (RC1), RML Carina (RC2), LUL Lingula Carina (Lc1), and Left Carina (LC2) from mucus.     Bronchial alveolar lavage was performed at Lateral Segment of RML (RB4) and Medial Segment of RML (RB5).  Instilled 40 cc of NS, suction returned with 20 cc of NS.  Samples sent for Cell Count, Microbiology (Cultures/Viral/Fungal), and Cytology.    EBUS-Findings  Indications: Diagnostic  Technique:  All lymph node stations were assessed. Only those 5 mm or greater in short axis were sampled.    Lymph node sizing was performed by EBUS and sampling by transbronchial needle aspiration was performed using 22-gauge Needle and Cryoprobe 1.13mm.  Lymph Nodes/Sites Inspected: 4R (lower paratracheal) node  4L (lower paratracheal) node  7 (subcarinal) node  11Ri lymph node  11L lymph node    Overall ROSE Diagnosis: Suggestive of benign-appearing lymphoid tissue    No immediate complications      Lymph Nodes Sampled:  Site 1: The 11L lymph node was => 10 mm on CT and Hypermetabolic via PET-CT scan. The lymph node was not photographed. The site was sampled.. 5 endobronchial ultrasound guided transbronchial  biopsies were performed with samples obtained.   Preliminary ROSE Cytology was reported as not adequate and suggestive of Specimen was inadequate for ROSE analysis . Final results are pending.    Site 2: The 7 (subcarinal) node was => 10 mm on CT and Hypermetabolic via PET-CT scan. The lymph node was not photographed. The site was sampled.. 9 endobronchial ultrasound guided transbronchial biopsies were performed with samples obtained.   Preliminary ROSE Cytology was reported as adequate and suggestive of Suggestive of benign-appearing lymphoid tissue. Final results are pending.    Site 3: The 4R (lower paratracheal) node was => 10 mm on CT and Hypermetabolic via PET-CT scan. The lymph node was photographed. The site was sampled.. 5 endobronchial ultrasound guided transbronchial biopsies were performed with samples obtained.   Preliminary ROSE Cytology was reported as not adequate and suggestive of Specimen was inadequate for ROSE analysis . Final results are pending.    Site 4: The 11Ri lymph node was < 10 mm on CT  and Hypermetabolic via PET-CT scan. The lymph node was not photographed. The site was sampled.. 5 endobronchial ultrasound guided transbronchial biopsies were performed with samples obtained.   Preliminary ROSE Cytology was reported as not adequate and suggestive of Specimen was inadequate for ROSE analysis . Final results are pending.    Site 5: The 4L (lower paratracheal) node was < 10 mm on CT  and Hypermetabolic via PET-CT scan. The lymph node was not photographed. The site was not sampled: Sampling this lymph node was not clinically indicated.     Endobronchial biopsy was performed at RML Carina (RC2).  Lesion was successfully removed.  Samples sent for Pathology.     Successful therapeutic aspiration was performed to clean out the Trachea (Proximal 1/3), Trachea (Middle 1/3), Trachea (Distal 1/3), Right Mainstem, Bronchus Intermedius , Left Mainstem, Carina, RUL Carina (RC1), RML Carina (RC2), LUL  Lingula Carina (Lc1), and Left Carina (LC2) from mucus, blood, and blood clots.         The patient tolerated the procedure well.  There were no immediate complications.  At the conclusion of the operation, the patient was extubated in the operating room and transported to the recovery room in stable condition.     SPECIMEN(S):   EBUS-TBNA 11L, 7, 4R, 11Ri  EBUS-Cryo 7  EBBX right middle lobe   BAL right middle lobe     IMPRESSION/PLAN: Tiffany Chen is a 77 year old-year-old female who presents for bronchoscopy for lymphadenopathy.    -Follow up bronchoscopic lab work    Charlyne Quale, MD  08/29/2022

## 2022-08-29 NOTE — Telephone Encounter (Signed)
Fax Received    Date Received: 08/26/2022     Time: 9:56 am    Date Processed: 08/29/2022    From:  [facility]    Document Type:  DME Form- Ostomy Supplies  -received @ 9:56 am; located in [employer]" folder ending in 24. Please send to "DONE FOLDER" after printing.    Please see Telephone encounter 08/26/22    Fax detail: Possible Duplicate      Outside fax, please review.  Thank you.

## 2022-08-29 NOTE — Discharge Instructions (Signed)
You have received sleeping medicines for your bronchoscopy.      FOR YOUR SAFETY:  1. You should NOT operate machines, drink alcohol, or take sleeping/nerve drugs for 24 hours unless the doctor tells you to.  You should NOT swim, ride a bicycle, skateboard or scooter, use roller blades or engage in rough physical play.  2. Avoid signing legal papers or making important decisions for 24 hours.  3. You should NOT drive for AT LEAST 24 HOURS. Patients who get sleeping drugs will be discharged only if an adult leaves with them.  After 24 hours you may drive if you no longer feel drowsy.    POST PROCEDURE INSTRUCTIONS:  1. You may resume your regular diet.  2. You may resume your normal prescription drugs unless told otherwise.  3. You may take Tylenol for low grade fevers the night of the procedure.  4. Rest for the remainder of the day.  You may resume normal activities tomorrow.   5. You may return to work or school tomorrow.   6. You may have a slight sore throat following the procedure.  Throat lozenges may be used as needed for a sore throat.      CALL YOUR DOCTOR FOR THE FOLLOWING:  * Coughing up large amount of blood clots (2 TBSP or more)  * Severe/worsening pain or vomiting  * Any chest pain  * Shortness of breath different than what you normally have  * Redness, pain or swelling at IV site that lasts greater than 2 days  * Fever the first night is expected.  Call if persistent or greater than 101    For any questions regarding your procedure please contact us at the numbers below.      Call 911 for any Emergency, Chest pain or Shortness of breath.    For procedures completed at the [facility] please call [phone].  Monday - Friday 8am - 5pm     After hours or on weekends, please call [phone] and ask for pulmonology fellow on-call.

## 2022-08-30 ENCOUNTER — Telehealth (INDEPENDENT_AMBULATORY_CARE_PROVIDER_SITE_OTHER): Payer: Self-pay

## 2022-08-30 ENCOUNTER — Ambulatory Visit (INDEPENDENT_AMBULATORY_CARE_PROVIDER_SITE_OTHER): Payer: Medicare Other | Admitting: Audiology

## 2022-08-30 LAB — ASPERGILLUS GALACTOMANNAN AG, BRONCH WASH: Aspergillus Galactomannan Ag, Bronch Wash: NEGATIVE

## 2022-08-30 LAB — M. TUBERCULOSIS AND RIFAMPIN RESISTANCE PCR: M. tuberculosis PCR: NOT DETECTED

## 2022-08-30 LAB — PNEUMOCYSTIS JIROVECII NUCLEIC ACID AMPLIFICATION TEST: Pneumocystis jirovecii Nucleic Acid Amplification Test: NOT DETECTED

## 2022-08-30 NOTE — Anesthesia Postprocedure Evaluation (Signed)
Anesthesia Post Note    Patient: Tsurue Wargel    Procedure(s) Performed: Procedure(s) with comments:  Flexible bronchoscpy, Endobronchial ultrasound with sampling,Transbronchial needle aspiration, and Bronchoalveolar lavage. Possible brushing, biopsy, and/or chest tube placement. - Potentially 4+ station EBUS TBNA.  With multi-site BAL.      Final anesthesia type: General    Patient location: PACU    Post anesthesia pain: adequate analgesia    Mental status: awake, alert , and oriented    Airway Patent: Yes    Last Vitals:   Vitals Value Taken Time   BP 108/64 08/29/22 1615   Temp 36 C 08/29/22 1555   Pulse 71 08/29/22 1624   Resp 16 08/29/22 1624   SpO2 100 % 08/29/22 1624   Vitals shown include unfiled device data.     Post vital signs: stable    Hydration: adequate    N/V:no    Anesthetic complications: no    Plan of care per primary team.

## 2022-08-30 NOTE — Telephone Encounter (Signed)
Who is calling: Ancillary: Hope from [employer] is calling on behalf of patient    Insurance Coverage Verified: Active- in network    Reason for this call: Hope is calling in because office  received prescription form for colostomy bag but most recent notes has diagonosis and need confirmation of which colsotomy pt has and if pt needs both a new form is need  or correct notes.    A call is preferred to make sure correct documentation is done to avoid delay. Do not fax please    Fax [phone]    Action required by office: Please contact caller     Duplicate encounter? No previous documentation found on this issue.     Best way to contact: [phone]  Alternative:     Inquiry has been read verbatim to this caller. Verbalizes satisfaction and confirms the above is accurate: yes      Has been advised this message will be transmitted to office and can expect a response within the next 24-72 hours.

## 2022-08-31 ENCOUNTER — Other Ambulatory Visit (INDEPENDENT_AMBULATORY_CARE_PROVIDER_SITE_OTHER): Payer: Self-pay

## 2022-08-31 DIAGNOSIS — E119 Type 2 diabetes mellitus without complications: Secondary | ICD-10-CM

## 2022-08-31 LAB — QUANT BRONCH CULTURE W/GRAM STAIN
Gram Stain Result: NONE SEEN
Quant Bronch Wash Culture Result: >10000

## 2022-09-02 LAB — LIMITED LYMPHOMA FLOW CYTOMETRY PANEL

## 2022-09-02 LAB — LYMPHOMA FLOW CYTOMETRY PANEL

## 2022-09-02 NOTE — Telephone Encounter (Signed)
The active order for the requested med was entered as historical (1 med)        Mallie Darting, CPhT  ([facility])      Biguanide Refill Protocol    Last visit in enc specialty: 07/09/2022     Recent Visits in This Encounter Department       Date Provider Department Visit Type Primary Dx    07/09/2022 Monia Sabal, MD [place] [facility] Internal Medicine Office Visit Type 2 diabetes mellitus without complication, without long-term current use of insulin (CMS-HCC)    05/03/2022 Roque Cash, NP [place] [facility] Internal Medicine Office Visit Type 2 diabetes mellitus without complication, without long-term current use of insulin (CMS-HCC)    12/18/2021 Monia Sabal, MD [place] [facility] Internal Medicine Office Visit Encounter for Medicare annual wellness exam    04/23/2021 Monia Sabal, MD [place] [facility] Internal Medicine Office Visit Type 2 diabetes mellitus without complication, without long-term current use of insulin (CMS-HCC)           Population Health Visits  Recent [facility] Visits    None       Next f/u appt due:  none  Next appt in enc specialty: Visit date not found      Future Appointments 09/02/2022 - 09/01/2027        Date Visit Type Department Provider     09/10/2022 10:00 AM RET IP N[place] [facility] ONCOLOGY Duchman, Lacie Draft, MD    Appointment Notes:     Clinic visit, bronch results             09/16/2022  9:00 AM LVL 4 - 4 HR [place] [facility] ONCOLOGY CHEMO [facility] INFUSION, 9TH FLOOR    Appointment Notes:     IVIG 25 mg             11/04/2022  9:00 AM LVL 4 - 4 HR [place] [facility] ONCOLOGY CHEMO [facility] INFUSION, 9TH FLOOR    Appointment Notes:     IVIG 25 mg             11/25/2022 12:30 PM RETURN OPHTHALMOLOGY [place] [facility]PHTH[facility] Grant Ruts, MD    Appointment Notes:     6 month follow up             12/02/2022  9:00 AM LVL 4 - 4 HR [place]  [facility] ONCOLOGY CHEMO [facility] INFUSION, 9TH FLOOR    Appointment Notes:     IVIG 25 mg             12/10/2022  2:00 PM AUDIOGRAM [place] [facility] AUDIOLOGY Andreas Newport, AuD    Appointment Notes:     6 month Ototoxic Monitoring  Audiogram             01/29/2023 11:00 AM RETURN TB/MAC [place] [facility] PULMON[facility] GENERAL [name], MD    Appointment Notes:     2 month follow up- (around 10/26/2022) for F2F WITH MD.             01/29/2023  2:30 PM RETURN IMMUNODEFICIENCY [place] [facility] Allergy UPC Selena Batten Sharman Crate, MD    Appointment Notes:     6 month immuno f/u             06/09/2023  2:00 PM AUDIOGRAM [place] [facility] HNS  AUDIOLOGY Andreas Newport, AuD    Appointment Notes:     1 year of Ototoxic monitoring audiogram                    Per OV note on 07/09/22:  S:# DM2 - on metformin 500mg  qAM and 1058m q PM, latter increased a month ago  Home fasting sugars max 140s  A1c last week 6.4    T2DM  - Felt fatigued after surgery in 03/2022, attributes to elevated blood sugars at that time (300s)  - Regimen: Metformin increased to 500 mg in AM and 1,000 mg in PM  - A1c 6.4% in 06/2022  - Home blood sugar: Elevated since prior hospitalization, now has monitor. Ranges 140s  - Planned for upcoming surgery in 2 months for colostomy reversal, however expresses worry about elevated blood sugars possibly contributing to fatigue and significant prolonged recovery, similar to 03/2022     A/P: Type 2 diabetes mellitus without complication, without long-term current use of insulin (CMS-HCC)  - Regimen: Metformin 500 mg in AM and 1,000 mg in PM  - A1c 6.4% in 06/2022  A1c currently within goal range. 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 Will follow-up closely in 2 months following next surgery to address needs at that time.  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    Diabetic nephropathy: Last urine micro albumin - 12/2021    Diabetic neuropathy: Last monofilament - 07/09/2022, completed today      LABS required:  (Q 34mo HgbA1C) (Q year Creatinine, Lipid Panel(pop), ALT, Microalbumin)    Lab Results   Component Value Date    A1C 6.2 (H) 08/05/2022    A1C 6.4 (H) 07/03/2022    A1C 6.7 (H) 05/06/2022     Per labs 08/05/22      No results found for: "A1CP"    Lab Results   Component Value Date    NA 134 (L) 08/05/2022    K 5.0 08/05/2022    CL 100 08/05/2022    BICARB 19 (L) 08/05/2022    BUN 18 08/05/2022    CREAT 1.03 (H) 08/05/2022       Lab Results   Component Value Date    GFRNON >60 11/06/2020    EGFRCKDEPI 56 08/05/2022       Lab Results   Component Value Date    CHOL 171 05/06/2022    TRIG 124 05/06/2022    HDL 61 05/06/2022    NHDLV 110 05/06/2022    LDLCALC 85 05/06/2022        Lab Results   Component Value Date    ALT 35 (H) 08/05/2022       No results found for: "MICALBCR"    Lab Results   Component Value Date    MICALB <1.2 12/25/2021       Lab Results   Component Value Date    MACRR Unable to calculate 12/25/2021       No results found for: "MICROALBUMIN", "MCRALCREAT"    Lab Results   Component Value Date    CREATUR 82 12/25/2021         Monitoring required:  (Q year BP)    (BP range: Systolic=90-150  GNFAOZHYQ=65-78)  Blood Pressure   08/29/22 111/63   08/27/22 116/67   08/26/22 121/69  Last Digital Health Monitoring Vitals:         Last Digital Health Monitoring BG:        Last MyChart BP Values:        Last Pt Entered MyChart BP Values:               If information is available and meets protocol criteria: May refill up to SIX MONTHS

## 2022-09-03 MED ORDER — METFORMIN HCL 500 MG OR TABS
ORAL_TABLET | ORAL | 1 refills | Status: AC
Start: 2022-09-03 — End: ?

## 2022-09-03 NOTE — Telephone Encounter (Signed)
CALL BACK    Caller Information:  Who is calling? Ancillary: Aurther Loft from [employer] is calling on behalf of patient.   Best way to contact: Other: [phone]  What is the reason for the call? Received order but clinical notes states pt has ileostomy and colostomy. Needs to get prescription corrected, and notate in chart note that pt has an ileostomy if correct.     Also would like to know if pt is actually having a reversal on Sept 2024, so they can know how long do they need supply for.   Encounter Details:  Is this a duplicate encounter?: No previous documentation found on this issue.  Action required by the office: Please contact caller  Patient Information:  Is MyChart active?: Yes.  Verification:  Has the inquiry been read verbatim to the caller and verbalizes satisfaction and confirms the above is accurate?: Yes  Caller has been advised this message will be transmitted to the office and can expect a response within the next 24-72 hours during working business days.  Encounter created by Vivien Presto from the Care Assist Team.  Signature generated from controlled access password on September 03, 2022 at 11:01 AM.

## 2022-09-03 NOTE — Telephone Encounter (Signed)
Please clarify which notes? NP's? PCP's? GI? General surgery's? Date?   I have signed the orders as a covering provider, so please clarify above question so I can correct it(I can not change other providers not though).    Roque Cash, Nurse Practitioner

## 2022-09-04 ENCOUNTER — Telehealth (INDEPENDENT_AMBULATORY_CARE_PROVIDER_SITE_OTHER): Payer: Self-pay | Admitting: General Surgery

## 2022-09-04 NOTE — Telephone Encounter (Signed)
 Attempt to call pt.  No answer left a VM message to return my call.

## 2022-09-05 ENCOUNTER — Telehealth (INDEPENDENT_AMBULATORY_CARE_PROVIDER_SITE_OTHER): Payer: Self-pay | Admitting: General Surgery

## 2022-09-05 ENCOUNTER — Encounter (INDEPENDENT_AMBULATORY_CARE_PROVIDER_SITE_OTHER): Payer: Self-pay | Admitting: General Surgery

## 2022-09-05 DIAGNOSIS — K219 Gastro-esophageal reflux disease without esophagitis: Secondary | ICD-10-CM

## 2022-09-05 NOTE — Telephone Encounter (Signed)
Reviewed, 07/09/2022 notes was done by Dr. Ilene Qua, pt's PCP, please forward this message to appropriate provider.    Roque Cash, Nurse Practitioner

## 2022-09-05 NOTE — Telephone Encounter (Addendum)
Tiffany Chen 1945/10/11 [id number]    APC Triage Tool Visit Result: APP Video Visit   Translation Needed: NO  DOS: 11/25/22  Surgeon: Dr.Broderick   Status: Inpatient   Would like to do a telephone visit   Co- case with Dr. Tonita Phoenix   Patient needs APC appointment 2-5 weeks from DOS. No appointments available.

## 2022-09-05 NOTE — Telephone Encounter (Signed)
Phone call from pt. wanted to have surgery 9/23 , explained to the patient that Dr. Tonita Phoenix will be in a conference that afternoon . The patient accepted to have surgery 10/24. Pt. would like a call back from Dr. Tonita Phoenix scheduler gave pt. Ph. Number [phone] and that I will send Jenn a message.

## 2022-09-05 NOTE — Telephone Encounter (Signed)
Please contact Aurther Loft from [employer]:  Apologies for the confusion - the patient has a history of colostomy since 2009, but this was revised in March 2024 with colostomy takedown and subsequent creation of an ileostomy. She currently requires ileostomy supplies. I will addend my clinic note from 07/2022. She is tentatively scheduled for hernia repair and ileostomy takedown in on 11/25/2022.

## 2022-09-05 NOTE — Telephone Encounter (Signed)
Procedure:Laparoscopic hiatal hernia repair, gastric fundoplication, intraoperative upper endoscopy, possible mesh placement COMPLEX ILEOSTOMY, TAKE-DOWN   DOS: 11/25/22  Surgical Risk Classification System Category: HIGH  APC Triage Visit Level:  3  Requires APC testing- A1C (If yes, requires in person visit at [facility] for A1C))  Does patient require in-person APP appt? NO         This patient's chart has been reviewed. The APC scheduling team will schedule the appointment. Once appointment is made they will message you back. If no appt slot can be found, patient will be see anesthesia DOS.        Thank you for your patience,    APC Team Members

## 2022-09-05 NOTE — Telephone Encounter (Signed)
Attempted to contact patient to schedule APC appointment. LM for patient with proposed appointment details. Redirected back to procedure scheduling team. Routed to procedure scheduling to contact patient and confirm APC appointment in EPIC. Please send new message if patient proposed appointment needs to be rescheduled.    Thank you,      APC Team

## 2022-09-05 NOTE — Telephone Encounter (Signed)
CALL BACK    Caller Information:  Who is calling? Ancillary: Aurther Loft from [employer] is calling on behalf of patient.   Best way to contact: Other: [phone]  What is the reason for the call? Calling back on note received DOS 07/09/22, needs to know which is correct please advice.   Encounter Details:  Is this a duplicate encounter?: No previous documentation found on this issue.  Action required by the office: Please contact caller  Patient Information:  Is MyChart active?: Yes.  Verification:  Has the inquiry been read verbatim to the caller and verbalizes satisfaction and confirms the above is accurate?: Yes  Caller has been advised this message will be transmitted to the office and can expect a response within the next 24-72 hours during working business days.  Encounter created by Vivien Presto from the Care Assist Team.  Signature generated from controlled access password on September 05, 2022 at 12:19 PM.

## 2022-09-06 ENCOUNTER — Telehealth (INDEPENDENT_AMBULATORY_CARE_PROVIDER_SITE_OTHER): Payer: Self-pay

## 2022-09-06 ENCOUNTER — Encounter (HOSPITAL_BASED_OUTPATIENT_CLINIC_OR_DEPARTMENT_OTHER): Payer: Self-pay | Admitting: Internal Medicine

## 2022-09-06 NOTE — Telephone Encounter (Signed)
Called [facility]. They will fax new prescription for MD to sign. Informed that MD will be back in clinic on 09/04. They are requesting to have addended notes faxed in the mean time. (Faxed to [phone])    Will wait on faxed prescription.

## 2022-09-06 NOTE — Telephone Encounter (Signed)
Fax Received    Date Received: 09/03/2022      Time: 11:12:49 AM    Date Processed: 09/06/2022    From:  [facility]    Document Type: DME/ Order form for [employer]      Fax detail:   DME/ Order form for [employer] received @ 11:12:49 AM; located in [employer]" folder ending in 28. Please send to "[employer]" after printing.      Outside fax, please review.  Thank you.

## 2022-09-06 NOTE — Telephone Encounter (Signed)
Fax printed.

## 2022-09-10 ENCOUNTER — Encounter (HOSPITAL_BASED_OUTPATIENT_CLINIC_OR_DEPARTMENT_OTHER): Payer: Self-pay | Admitting: Internal Medicine

## 2022-09-10 ENCOUNTER — Other Ambulatory Visit (HOSPITAL_BASED_OUTPATIENT_CLINIC_OR_DEPARTMENT_OTHER): Payer: Medicare Other

## 2022-09-10 ENCOUNTER — Ambulatory Visit: Payer: Medicare Other | Attending: Internal Medicine | Admitting: Internal Medicine

## 2022-09-10 VITALS — BP 133/73 | HR 88 | Temp 97.3°F | Resp 16 | Wt 140.0 lb

## 2022-09-10 DIAGNOSIS — R599 Enlarged lymph nodes, unspecified: Secondary | ICD-10-CM | POA: Insufficient documentation

## 2022-09-10 DIAGNOSIS — R590 Localized enlarged lymph nodes: Secondary | ICD-10-CM

## 2022-09-10 DIAGNOSIS — A310 Pulmonary mycobacterial infection: Secondary | ICD-10-CM | POA: Insufficient documentation

## 2022-09-10 DIAGNOSIS — D809 Immunodeficiency with predominantly antibody defects, unspecified: Secondary | ICD-10-CM

## 2022-09-10 DIAGNOSIS — J479 Bronchiectasis, uncomplicated: Secondary | ICD-10-CM

## 2022-09-10 LAB — COMPREHENSIVE METABOLIC PANEL, BLOOD
ALT (SGPT): 32 U/L (ref 0–33)
AST (SGOT): 29 U/L (ref 0–32)
Albumin: 4.4 g/dL (ref 3.5–5.2)
Alkaline Phos: 79 U/L (ref 40–130)
Anion Gap: 10 mmol/L (ref 7–15)
BUN: 24 mg/dL — ABNORMAL HIGH (ref 8–23)
Bicarbonate: 23 mmol/L (ref 22–29)
Bilirubin, Tot: 0.2 mg/dL (ref <1.2)
Calcium: 10 mg/dL (ref 8.5–10.6)
Chloride: 102 mmol/L (ref 98–107)
Creatinine: 0.9 mg/dL (ref 0.51–0.95)
Glucose: 150 mg/dL — ABNORMAL HIGH (ref 70–99)
Potassium: 4.5 mmol/L (ref 3.5–5.1)
Sodium: 135 mmol/L — ABNORMAL LOW (ref 136–145)
Total Protein: 7.6 g/dL (ref 6.0–8.0)
eGFR Based on CKD-EPI 2021 Equation: >60 mL/min/{1.73_m2}

## 2022-09-10 LAB — CBC WITH DIFF, BLOOD
ANC-Automated: 5.4 10*3/uL (ref 1.6–7.0)
Abs Basophils: 0 10*3/uL (ref <0.2)
Abs Eosinophils: 0.1 10*3/uL (ref 0.0–0.5)
Abs Lymphs: 0.9 10*3/uL (ref 0.8–3.1)
Abs Monos: 0.5 10*3/uL (ref 0.2–0.8)
Basophils: 0.6 %
Eosinophils: 1.3 %
Hct: 37.9 % (ref 34.0–45.0)
Hgb: 12.6 gm/dL (ref 11.2–15.7)
Imm Gran %: 0.6 % (ref <1)
Imm Gran Abs: 0 10*3/uL (ref <0.1)
Lymphocytes: 12.8 %
MCH: 30.5 pg (ref 26.0–32.0)
MCHC: 33.2 g/dL (ref 32.0–36.0)
MCV: 91.8 um3 (ref 79.0–95.0)
MPV: 10.7 fL (ref 9.4–12.4)
Monocytes: 7.1 %
Plt Count: 299 10*3/uL (ref 140–370)
RBC: 4.13 10*6/uL (ref 3.90–5.20)
RDW: 13.2 % (ref 12.0–14.0)
Segs: 77.6 %
WBC: 7 10*3/uL (ref 4.0–10.0)

## 2022-09-10 LAB — CYCLIC CITRUL PEP AB, IGG: Cyclic Citrul PEP AB, IGG: <8 U/mL

## 2022-09-10 LAB — RF (RHEUMATOID FACTOR), BLOOD: RF: <10 [IU]/mL (ref 0–13)

## 2022-09-10 LAB — IGG, BLOOD: IGG: 1030 mg/dL (ref 700–1600)

## 2022-09-10 NOTE — Progress Notes (Signed)
REFERRING PHYSICIAN   Referring Provider:  [name], MD  [address]  [id number]  [place],  [place] [address]    Primary Care Provider:  Delight Ovens Tolentino  [address]  [place] [address]    Subjective   PATIENT IDENTIFICATION   Tiffany Chen is a 77 year old female seen in consultation originally at the request of Dr. Fransico Michael.    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  First seen as [place] IP consult on 08/13/2022.    Had been on MAC-coverage from 08/2020-06/08/2022.  CT has showed stability of lung nodules, suggestion stability of disease.  However, lymph nodes remained persistently enlarged.  PET/CT to evaluate LNs demonstrated multiple paratracheal and hilar FDG-avid LNs.  Cause of LAD was unclear, which led to EBUS evaluation.    09/10/2022:  Patient underwent bronchoscopy with EBUS of 4R, 7, 11Ri, and 11L lymph nodes, including cryobiopsy of station 7 LN.  Pathology from multiple LNs show granulomatous inflammation.  For about 1 week afterward, had increased productive cough but that has since resolved.  No post-procedure fever or hemoptysis.  Sister accompanies her today.  Patient currently feels well from a respiratory standpoint.  Denies fever, chills.    Evaluation to date:  CT chest, PET/CT    IP Procedures to date:   12/25/2020:  Bronchoscopy with BAL  08/29/2022:  Bronchoscopy with EBUS of 4R, 7, 11Ri, and 11L lymph nodes, including cryobiopsy of station 7 LN            TOBACCO HISTORY     Social History     Tobacco Use   Smoking Status Never   Smokeless Tobacco Never       PAST HISTORY     Problems   Patient Active Problem List    Diagnosis Date Noted    Mediastinal lymphadenopathy 08/13/2022    Unspecified  mood (affective) disorder (CMS-HCC) 07/09/2022    Hyperlipidemia 07/09/2022    Nonruptured cerebral aneurysm 07/09/2022    Recurrent major depression in remission (CMS-HCC) 07/09/2022    Ileostomy in place (CMS-HCC) 04/16/2022    S/P repair of ventral hernia 03/25/2022    Pseudophakia of both eyes 01/14/2022    Posterior vitreous detachment 01/14/2022    Type 2 diabetes mellitus with other specified complication, unspecified whether long term insulin use (CMS-HCC) 12/18/2021    Status post cataract extraction and insertion of intraocular lens of left eye 11/23/2021    Status post cataract extraction and insertion of intraocular lens of right eye 11/09/2021    Ventral hernia without obstruction or gangrene 07/24/2021    Colostomy status (CMS-HCC) 07/24/2021    Status post Gertie Gowda procedure (CMS-HCC) 06/26/2021    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    s/p Robotic right upper lobectomy with en bloc lower and middle lobe wedge resection, middle lobe wedge resection 03/20/2021     03/20/21  Robotic right upper lobectomy with en bloc lower and middle lobe wedge resection, middle lobe wedge resection ([facility])      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    Gastroesophageal reflux disease, unspecified whether esophagitis present 10/12/2020     Added automatically from request for surgery [id number]      Hoarse  voice quality 10/12/2020     Added automatically from request for surgery [id number]      Cavitary lesion of lung 07/24/2020    Acquired tracheobronchomegaly with bronchiectasis (CMS-HCC) 05/22/2020    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          Past Medical History   Past Medical History:   Diagnosis Date    Acquired tracheobronchomegaly with bronchiectasis (CMS-HCC) 05/22/2020    Age-related nuclear cataract of left eye 10/15/2021    Cavitary lesion of lung 07/24/2020    Combined forms of age-related cataract of both eyes 10/15/2021    Depression      DM (diabetes mellitus) (CMS-HCC)     Gastroesophageal reflux disease, unspecified whether esophagitis present 10/12/2020    Added automatically from request for surgery [id number]    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         Past Surgical History   Past Surgical History:   Procedure Laterality Date    BRONCHOSCOPY  12/2020    CESAREAN SECTION, LOW TRANSVERSE      COLOSTOMY      CRANIOTOMY      LUNG LOBECTOMY           Family History   Family History   Problem Relation Name Age of Onset    Cancer Mother Denton Lank ngs and throat    Heart Disease Mother Joya Martyr         Congrestive heart failure due to cancer    Hypertension Mother Joya Martyr     Psychiatry Mother Joya Martyr         Bi polar, manic depressive    Cancer Father Jamal Maes         Prostate    Diabetes Father Jamal Maes     Stroke Father Jamal Maes     Stroke M Grandmother Winifred Larita Fife     Cancer Brother Vallarie Mare         Skin (many locations)    Diabetes Brother Vallarie Mare          Social History   Social History     Tobacco Use    Smoking status: Never    Smokeless tobacco: Never   Substance Use Topics    Alcohol use: Yes     Comment: glass of wine every other month     Social History     Social History Narrative    Lives with brother Fayrene Fearing at home. Sister also lives in [place]        ALLERGIES     Allergies   Allergen Reactions    Walnuts Swelling     Tongue swollen and burn       HOME MEDICATIONS     Current Outpatient Medications   Medication Sig Dispense  Refill    atorvastatin (LIPITOR) 20 MG tablet Take 1 tablet (20 mg) by mouth daily. 90 tablet 1    biotin 1000 MCG tablet Take 1 tablet (1,000 mcg) by mouth daily.  blood glucose meter PHARMACY: Please dispense insurance approved bluetooth enable meter. 1 each 0    ferrous sulfate 325 (65 Fe) MG tablet Take 1 tablet (325 mg) by mouth daily.      fluoxetine (PROZAC) 40 MG capsule Take 1 capsule (40 mg) by mouth daily. 90 capsule 3    glucose blood test strip 1 strip by Other route every morning (before breakfast). 100 strip 2    glucose blood test strip 1 strip by Other route 2 times daily (before meals). 100 strip 11    lancets 1 each by Other route every morning (before breakfast). 100 each 2    lancets 1 each by Other route 2 times daily (before meals). 100 Lancet. 5    metFORMIN (GLUCOPHAGE) 500 mg tablet Take 1 tablet (500 mg) by mouth every morning AND 2 tablets (1,000 mg) every evening. 270 tablet 1    Multiple Vitamin (MULTIVITAMIN) TABS tablet Take 1 tablet by mouth daily.      omeprazole (PRILOSEC) 20 MG capsule Take 1 capsule (20 mg) by mouth every morning (before breakfast). 90 capsule 3    psyllium (METAMUCIL) WAFR Take 2 Wafers by mouth every 12 hours as needed (As needed to maintain ileostomy output <1.2L). 24 packet 0    ramipril (ALTACE) 2.5 MG capsule Take 1 capsule (2.5 mg) by mouth daily. 90 capsule 3    sodium chloride 7 % NEBU USE 1 VIAL VIA NEBULIZER  EVERY 12 HOURS 720 mL 3    umeclidinium-vilanterol (ANORO ELLIPTA) 62.5-25 MCG/ACT inhaler Inhale 1 puff by mouth daily. 60 each 11    VENTOLIN HFA 108 (90 Base) MCG/ACT inhaler USE 2 INHALATIONS BY MOUTH  EVERY 6 HOURS AS NEEDED FOR WHEEZING 72 g 3    vitamin D3 125 MCG (5000 UT) capsule Take 1 capsule (5,000 Units) by mouth every 24 hours.      vitamin E 400 UNIT capsule Take 1 capsule (400 Units) by mouth daily.       No current facility-administered medications for this visit.        REVIEW OF SYSTEMS   ROS  10-point review of systems was  negative, except for as noted above and/or in HPI.       Objective   PHYSICAL EXAMINATION   BP 133/73 (BP Location: Left arm, BP Patient Position: Sitting, BP cuff size: Regular)   Pulse 88   Temp 97.3 F (36.3 C) (Temporal)   Resp 16   Wt 63.5 kg (139 lb 15.9 oz)   SpO2 97%   BMI 24.80 kg/m    Oxygen Therapy  SpO2: 97 %        SMOKING STATUS:  Never smoker    Physical Exam  Vitals reviewed.   Constitutional:       General: She is not in acute distress.     Appearance: Normal appearance. She is normal weight. She is not ill-appearing.   HENT:      Head: Normocephalic and atraumatic.   Cardiovascular:      Rate and Rhythm: Normal rate and regular rhythm.      Pulses: Normal pulses.   Pulmonary:      Effort: Pulmonary effort is normal. No respiratory distress.      Comments: Very mild right basilar end-expiratory wheeze.  Otherwise CTAB.  Abdominal:      General: There is no distension.      Palpations: Abdomen is soft.      Tenderness: There is no abdominal tenderness.  Comments: Colostomy present.   Musculoskeletal:         General: No swelling or tenderness.   Skin:     General: Skin is warm and dry.   Neurological:      General: No focal deficit present.      Mental Status: She is alert.   Psychiatric:         Mood and Affect: Mood normal.         Behavior: Behavior normal.           LABS/STUDIES   All studies were reviewed personally.    CBC:  BMP:   Lab Results   Component Value Date    WBC 6.2 08/05/2022    WBC 5.3 07/03/2022    WBC 3.9 (L) 06/06/2022    HGB 13.0 08/05/2022    HGB 12.8 07/03/2022    HGB 12.3 06/06/2022    HCT 38.6 08/05/2022    HCT 38.0 07/03/2022    HCT 36.0 06/06/2022    PLT 247 08/05/2022    PLT 240 07/03/2022    PLT 207 06/06/2022    SEG 70.6 08/05/2022    SEG 55.2 07/03/2022    SEG 61.7 06/06/2022    LYMPHS 17.4 08/05/2022    LYMPHS 30.4 07/03/2022    LYMPHS 21.7 06/06/2022    MONOS 8.1 08/05/2022    MONOS 9.7 07/03/2022    MONOS 12.1 06/06/2022     Lab Results   Component  Value Date    NA 134 (L) 08/05/2022    NA 139 07/03/2022    NA 140 06/06/2022    K 5.0 08/05/2022    K 4.4 07/03/2022    K 4.2 06/06/2022    CL 100 08/05/2022    CL 102 07/03/2022    CL 106 06/06/2022    BICARB 19 (L) 08/05/2022    BICARB 26 07/03/2022    BICARB 24 06/06/2022    BUN 18 08/05/2022    BUN 16 07/03/2022    BUN 14 06/06/2022    CREAT 1.03 (H) 08/05/2022    CREAT 0.98 (H) 07/03/2022    CREAT 0.77 06/06/2022    GLU 151 (H) 08/05/2022    GLU 169 (H) 07/03/2022    GLU 141 (H) 06/06/2022    [place] 10.0 08/05/2022    [place] 9.7 07/03/2022    [place] 9.9 06/06/2022    MG 1.9 04/03/2022    MG 2.0 04/02/2022    MG 2.1 04/01/2022    PHOS 3.7 04/03/2022    PHOS 3.5 04/02/2022    PHOS 3.2 04/01/2022        Coags:  LFTs:   No results found for: "PTT", "INR", "PT"   Lab Results   Component Value Date    ALK 79 08/05/2022    ALK 64 07/03/2022    ALK 59 06/06/2022    AST 31 08/05/2022    AST 29 07/03/2022    AST 21 06/06/2022    ALT 35 (H) 08/05/2022    ALT 28 07/03/2022    ALT 22 06/06/2022    TBILI 0.61 08/05/2022    TBILI 0.36 07/03/2022    TBILI 0.46 06/06/2022    DBILI <0.2 07/17/2020    TP 7.2 08/05/2022    TP 6.7 07/03/2022    TP 6.7 06/06/2022    ALB 4.3 08/05/2022    ALB 4.2 07/03/2022    ALB 4.2 06/06/2022        ABG   No results found for: "ARTPH", "ARTPCO2", "  ARTPO2", "ARTHC03", "ARTBE", "ARTO2SAT"        Microbiology:    Microbiology Results (last 30 days)       Procedure Component Value - Date/Time    Fungal Culture Sterile Container Lung, Right [[id number]] Collected: 08/29/22 1532    Lab Status: Preliminary result Specimen: Bronchial Washings from Lung, Right Updated: 08/30/22 1100     Fungus Culture Result Culture in progress.    Acid-Fast Bacillus (AFB) Culture and AFB Stain Sterile Container Lung, Right [[id number]] Collected: 08/29/22 1532    Lab Status: Preliminary result Specimen: Bronchial Washings from Lung, Right Updated: 08/30/22 1456     AFB Smear Result Negative for acid-fast bacilli     AFB Culture Result  Culture in progress.    Bronchial Washing Culture, Quantitative Sterile Container Lung, Right [[id number]] Collected: 08/29/22 1532    Lab Status: Final result Specimen: Bronchial Washings from Lung, Right Updated: 08/31/22 2346     Quant Bronch Wash Culture Result >10,000 CFU/ml Normal Respiratory Flora     Gram Stain Result No organisms seen    Pneumonia Pathogen Nucleic Acid Detection Test [[id number]] Collected: 08/29/22 1532    Lab Status: Final result Specimen: Bronchial Washings from Lung, Right Updated: 08/29/22 2028     Acinetobacter calcoaceticus-baumannii complex PCR, BAL Not Detected     Enterobacter cloacae complex PCR, BAL Not Detected     Escherichia coli PCR, BAL Not Detected     Haemophilus influenzae PCR, BAL Not Detected     Klebsiella aerogenes PCR, BAL Not Detected     Klebsiella oxytoca PCR, BAL Not Detected     Klebsiella pneumoniae complex PCR, BAL Not Detected     Moraxella catarrhalis PCR, BAL Not Detected     Proteus species PCR, BAL Not Detected     Pseudomonas aeruginosa PCR, Bal Not Detected     Serratia marcescens PCR, BAL Not Detected     Staphylococcus aureus PCR, BAL Not Detected     Streptococcus agalactiae (Group B) PCR, BAL Not Detected     Streptococcus pneumoniae PCR, BAL Not Detected     Streptococcus pyogenes (Group A) PCR, BAL Not Detected     Chlamydophila (Chlamydia) pneumoniae PCR, BAL Not Detected     Legionella pneumophila PCR, BAL Not Detected     Mycoplasma pneumoniae PCR, BAL Not Detected     Adenovirus PCR, BAL Not Detected     (NON-COVID-19) Coronavirus PCR, BAL Not Detected     Comment: This test can detect common coronaviruses 229E, HKU1, NL63, and OC43, but   does not differentiate them. This test DOES NOT detect SARS-CoV, MERS-CoV,   or SARS-CoV-2 (COVID-19).           Human Metapneumovirus PCR, BAL Not Detected     Human Rhinovirus/Entrovirus PCR, BAL Not Detected     Influenza A Virus PCR, BAL Not Detected     Comment: This result has been reported to the [facility] and Epidemiology Unit as required  by the [employer], Title 17, Section 2505.  This test can detect Influenza virus type A, but cannot differentiate   subtypes.           Influenza B Virus PCR, BAL Not Detected     Comment: This result has been reported to the [facility] and Epidemiology Unit as required  by the [employer], Title 17, Section 2505.  Parainfluenza virus PCR, BAL Not Detected     Respiratory syncytial virus PCR, BAL Not Detected     Comment: This result has been reported to the [facility] and Epidemiology Unit as required  by the [employer], Title 17, Section 2505.  ---------------------------------------------  This test uses an FDA-approved multiplex real-time  PCR assay for the qualitative detection and   differentiation of nucleic acids from viruses,   Legionella, Mycoplasma, and Chlamydophila, and   the qualitative detection and relative abundances  of nucleic acids from other bacteria associated   with lower respiratory tract infections. This   assay also uses the same methodology for the   qualitative detection of antibiotic resistance   genes when the pathogens that harbor them are   detected. This test is intended to be used to aid  in diagnosing viral lower respiratory tract   infections, as a guide for determining bacterial  colonization versus infection, and in proper   antibiotic selection. Since this test detects   nucleic acids, the results may be positive even   when these organisms are no longer viable. This   test is not intended to be used as a test of   cure. Orders for repeat testing during the same  hospital admission will be rejected unless   authorized by Infectious Disease or Microbiology  section director.         Mycobacterium tuberculosis/Rifampin Resistance PCR Sterile Container Lung, Right [[id number]]  Collected: 08/29/22 1532    Lab Status: Final result Specimen: Bronchial Washings from Lung, Right Updated: 08/30/22 1020     M. tuberculosis/Rifampin Resistance PCR Source Bronchial Washings     M. tuberculosis PCR Not Detected     Comment: This test uses a nested real time   polymerase chain reaction diagnostic test  for the qualitative detection of   Mycobacterium tuberculosis complex DNA   and specific antimicrobial resistance   markers in concentrated sediments. This   test has been modified from its [place] Food   and Drug Administration (FDA) approved   version, and its performance   characteristics determined by [facility]. This  test is for clinical purposes. It should  not be regarded as investigational or   for research use. This laboratory is   certified under the Clinical Laboratory   Improvement Amendments (CLIA) as   qualified to perform high complexity   testing.                  Beta-D-Glucan: No results found for: "13BDG", "13BDI"  Cocci IgG/IgM: No results found for: "COCIG", "COCIM"  ASPERGILLUS GALACTOMANNAN ANTIGEN, SERUM: No results found for: "ASPGS"  HISTOPLASMA QUANT AG, SOURCE SERUM:  No results found for: "HQS", "HQAG"  HISTOPLASMA QUANT AG, SOURCE URINE:  No results found for: "HISTOAGUR"    CRYPTOCOCCAL ANTIGEN SCREEN, SERUM: No results found for: "CRYPTOAG"  QUANTIFERON TB (TB1 minus NIL, TB2 minus NIL, Result):  No results found for: "QTB1N", "QTB2N", "Q4RES"  HIV 1/2 ANTIBODY & P24 ANTIGEN ASSAY:  No results found for: "HIV1TO2AB"     Bronchoscopy BAL (08/29/2022):   Latest Reference Range & Units 08/29/22 14:53   Fluid Type  Bronchial Lavage   Manual Fluid Total Nucleated Cell Count mm3 31   Fluid RBC mm3 mm3 Present   Neutrophils Fluid % % 70   Macrophages fluid % % 22   Fluid Other Non Hematogenous Cells % 8  Fluid Number of Total Nucleated Cell Counted  100       Pathology:  08/29/2022 bronchoscopy/EBUS results:    FINAL PATHOLOGIC DIAGNOSIS:   A: Lymph  node, station 7, cryo biopsy:   -Lymphoid tissue with occasional granulomas, see comment.   B: Lung, right middle lobe, biopsy:   -Fragments of bronchial wall with fibrosis.   COMMENT: AFB special stain, performed on blocks A1 and B1, is negative for   mycobacterial organisms.  GMS stain, performed on blocks A1 and B1, is   negative for fungal organisms.  Additional levels were examined.  No   carcinoma is identified.     7 TBNA:  FINAL CYTOPATHOLOGIC DIAGNOSIS:   No Atypical or Malignant Cells   COMMENT:   Available for review from 1 pass is 1 air dried and 1 fixed slide and cell   block material.   The aspirate consists of scattered lymphocytes and macrophages with   anthracotic pigment. The cell block contains a few lymphocytes.   Flow cytometry INTERPRETATION:                                                                 Targeted flow cytometric analysis of station 7 tissue shows no monotypic        B-cells. The T-cells show normal CD4:CD8 ratio at 4.6:1.     4R TBNA:  FINAL CYTOPATHOLOGIC DIAGNOSIS:   Granulomatous inflammation   COMMENT:   Available for review from 1 pass is 1 air dried and 1 fixed slide and cell   block material.   The aspirate consists predominantly of blood and bronchial cells. The cell   block contains many back to back non necrotizing granulomas. AFB stain is   negative, please correlate with culture.   Flow Cytometry INTERPRETATION:                                                                 Targeted flow cytometric analysis of 4R tissue shows no monotypic B-cells. The   T-cells show increased CD4:CD8 ratio at 5.5:1 with no aberrant loss of T-cell   markers.     11Ri TBNA:  FINAL CYTOPATHOLOGIC DIAGNOSIS:   No Atypical or Malignant Cells   Rare granuloma is seen in the cell block   COMMENT:   Available for review from 1 pass is 1 air dried and 1 fixed slide and cell   block material.   The aspirate consists predominantly of blood and rare bronchial cells. The   cell block contains  a few granulomas and lymphocytes.   Flow Cytometry INTERPRETATION:                                                                 Targeted flow cytometric analysis of 11Ri tissue shows no monotypic B-cells.    The  T-cells show increased CD4:CD8 ratio at 7.3:1 with no aberrant loss of      T-cell markers.     11L TBNA:  FINAL CYTOPATHOLOGIC DIAGNOSIS:   Granulomatous inflammation   COMMENT:   Available for review from 1 pass is 1 air dried and 1 fixed slide and cell   block material.   The aspirate consists of few aggregates of lymphocytes and bronchial cells.   The cell block contains a few necrotizing granulomas, please correlate with   microbiology culture.   Flow cytometry INTERPRETATION:                                                               Targeted flow cytometric analysis of station 11L shows no monotypic B-cells.    The T-cells show increased CD4:CD8 ratio at 5.5:1.     Imaging:   Last CT Chest Result       CT Chest W/O Contrast  Exam End: 05/20/2022  1:40 PM (Final result)   Impression: IMPRESSION:  Nonspecific, waxing and waning moderate mediastinal or hilar lymphadenopathy. CT follow-up in 6 months is suggested for re-evaluation. If lymphadenopathy worsens at that time, consider workup for underlying hematological malignancy.    Expected evolution of postoperative changes in the right hemithorax.    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                 07/22/2022 PET/CT:          Echo:  Transthoracic Echocardiogram (TTE)  No results found for this or any previous visit.    PFTs:       No data to display                      Assessment   ASSESSMENT/IMPRESSION     Problem List Items Addressed This Visit       Mediastinal lymphadenopathy - Primary    Relevant Orders    ANA (Anti-Nuclear Antibody) - See Instructions    ANCA (Anti-Neutrophil Cytoplasmic Ab) - See Instructions    (1,3)-Beta-D-Glucan (Fungitell) Red Plain    Histoplasma  Quant Antigen Red Plain    Quantiferon-TB, Blood Quantiferon Tube Set    Cryptococcal Antigen Screen, Blood Yellow serum separator tube    Coccidioides IgG/IgM Antibody Panel    Aspergillus Galactomannan Antigen, Serum Yellow serum separator tube    SSA Yellow serum separator tube    SSB Yellow serum separator tube    RF (Rheumatoid Factor) - See Instructions    Cyclic Citrul Peptide Antibody, IgG Yellow serum separator tube     Other Visit Diagnoses       Nontuberculous mycobacterial disease of lung (CMS-HCC)        Granulomatous adenopathy        Relevant Orders    ANA (Anti-Nuclear Antibody) - See Instructions    ANCA (Anti-Neutrophil Cytoplasmic Ab) - See Instructions    (1,3)-Beta-D-Glucan (Fungitell) Red Plain    Histoplasma Quant Antigen Red Plain    Quantiferon-TB, Blood Quantiferon Tube Set    Cryptococcal Antigen Screen, Blood Yellow serum separator tube    Coccidioides IgG/IgM Antibody Panel    Aspergillus Galactomannan Antigen, Serum Yellow serum separator tube    SSA Yellow serum  separator tube    SSB Yellow serum separator tube    RF (Rheumatoid Factor) - See Instructions    Cyclic Citrul Peptide Antibody, IgG Yellow serum separator tube              Sammatha Moman is a 77 year old female never smoker with bronchiectasis, h/o cavitary MAC, s/p RUL lobectomy and RML wedge resection (03/2021), 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    #Mediastinal & hilar LAD with PET FDG-avidity:  TBNA and LN cryobiopsy results consistent with granulomatous process.  There is a mix of both necrotizing and non-necrotizing granulomas reported.  I think this is most likely related to history of MAC infection.  However, other granulomatous processes (e.g. fungal, rheumatologic, sarcoid) not excluded.  Although no fungal elements noted on the TBNA/TBCBx results and presence of necrotizing granulomas would be unusual  for sarcoidosis (but there are subtypes).  No evidence of neoplastic/malignant/lymphoma process.  #Bronchiectasis, h/o cavitary MAC s/p prior treatment:  No AFB+ organisms identified on stain; AFB cultures pending.  #S/p RUL lobectomy and RML wedge resection (03/20/2021):  Due to cavitary MAC.  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  -Will alert Dr. Jeani Sow of pending BAL culture results - AFB culture negative thus far, but still pending.  -Remainder of pulmonary & MAC management per Dr. Juluis Mire excellent care.    -IP follow-up PRN         I personally reviewed and interpreted independently the radiographic and laboratory studies and evaluated the patient.   I discussed the plan above with the patient.  Lanell Cobbler expressed an understanding of this discussion and recommendations above.  All questions were answered to patient's satisfaction.  The patient has my contact information, and understands to call with any additional questions or concerns.    I personally spent 42 minutes in face-to-face and non-face-to-face activities related the patient's visit today, excluding any separately reportable services/procedures.  This time also excludes any interpretation services if applicable.    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  This is a chronic complex medical condition for which I have been managing longitudinally.     Lianne Bushy, MD  Pulmonary/Critical Care Attending Physician  [facility]  09/10/2022    CC:  [name], MD  [address]  [address]  [place],  [place] [address]  Patient Care Team:  [place], [place] Larey Dresser, MD as PCP - General    CC:  [name], MD  [address]  [address]  [place],  [place] [address]  Patient Care Team:  [place], [place] Larey Dresser, MD as PCP - General

## 2022-09-10 NOTE — Patient Instructions (Addendum)
-  Go to lab for blood draw to assess for other causes of granulomatous inflammation.    -Follow-up with Donald Prose regarding lab follow-up results and MAC management.  -Keep appointment with Dr. Jeani Sow on Oct 9th.

## 2022-09-10 NOTE — Telephone Encounter (Signed)
Closing this encounter. See 08/23 encounter.

## 2022-09-10 NOTE — Telephone Encounter (Signed)
Received fax    Placed in MD's inbox to review and sign.     MD back in clinic 09/04

## 2022-09-11 ENCOUNTER — Telehealth (INDEPENDENT_AMBULATORY_CARE_PROVIDER_SITE_OTHER): Payer: Self-pay

## 2022-09-11 LAB — QUANTIFERON-TB, BLOOD
Result: NEGATIVE
TB1 Minus Nil: 0 [IU]/mL
TB2 minus Nil: 0.01 [IU]/mL

## 2022-09-11 LAB — CRYPTOCOCCAL ANTIGEN BLOOD: Cryptococcal Ag Screen: NEGATIVE

## 2022-09-11 LAB — ASPERGILLUS GALACTOMANNAN AG, BLOOD: Aspergillus Galactomannan Ag, Serum: NEGATIVE

## 2022-09-11 LAB — ANA (ANTI-NUCLEAR AB), BLOOD: ANA (Anti-Nuclear Ab): NEGATIVE

## 2022-09-11 LAB — COCCIDIOIDES SCREEN, SERUM
Coccidiodes Antibody IgG Serum: 0.536 EIA Units (ref <1.000)
Coccidiodes Antibody IgM, Serum: 0.643 EIA Units (ref <1.000)

## 2022-09-11 NOTE — Telephone Encounter (Signed)
Who is calling: Ancillary: Hope from [employer] supply is calling on behalf of patient  Insurance Coverage Verified: Active- in network  Reason for this call: Hope states still hasn't received DME/Ostomy supplies signed order from pcp. Please re-fax [phone]    Action required by office: Please fax the following items: DME/Ostomy supplies order      Duplicate encounter? Yes, please refer to closed encounter dated 09/06/2022     Best way to contact: [phone]  Alternative:     Inquiry has been read verbatim to this caller. Verbalizes satisfaction and confirms the above is accurate: yes      Has been advised this message will be transmitted to office and can expect a response within the next 24-72 hours.

## 2022-09-12 LAB — ANTI-NEUTRO CYTOPLASMA AB, BLOOD: Anti-Neutro Cytoplasm Ab: NEGATIVE

## 2022-09-12 LAB — (1,3)-BETA-D-GLUCAN (FUNGITELL)
(1,3)-beta-D-glucan Interpretation: POSITIVE — AB
(1,3)-beta-D-glucan: 96 pg/mL

## 2022-09-12 NOTE — Telephone Encounter (Signed)
Faxed back and placed in lower left file drawer of this nurses desk.

## 2022-09-16 ENCOUNTER — Telehealth (INDEPENDENT_AMBULATORY_CARE_PROVIDER_SITE_OTHER): Payer: Self-pay

## 2022-09-16 ENCOUNTER — Ambulatory Visit
Admission: RE | Admit: 2022-09-16 | Discharge: 2022-09-16 | Disposition: A | Discharge status: Home / Self Care | Payer: Medicare Other | Attending: Allergy | Admitting: Allergy

## 2022-09-16 VITALS — BP 113/63 | HR 66 | Temp 98.0°F | Resp 16 | Ht 63.0 in | Wt 142.4 lb

## 2022-09-16 DIAGNOSIS — D809 Immunodeficiency with predominantly antibody defects, unspecified: Secondary | ICD-10-CM | POA: Insufficient documentation

## 2022-09-16 LAB — SSB, BLOOD: SSB Ab: <0.6 U/mL (ref <7.0)

## 2022-09-16 LAB — SSA, BLOOD: SSA Ab: 2.4 U/mL (ref <7.0)

## 2022-09-16 LAB — IGG, BLOOD: IGG: 876 mg/dL (ref 700–1600)

## 2022-09-16 MED ORDER — DIPHENHYDRAMINE HCL 25 MG OR TABS OR CAPS CUSTOM
25.0000 mg | ORAL_CAPSULE | Freq: Once | ORAL | Status: DC | PRN
Start: 2022-09-16 — End: 2022-09-16

## 2022-09-16 MED ORDER — DEXTROSE 5 % IV SOLN
INTRAVENOUS | Status: DC
Start: 2022-09-16 — End: 2022-09-16

## 2022-09-16 MED ORDER — CETIRIZINE HCL 10 MG OR TABS
10.0000 mg | ORAL_TABLET | Freq: Once | ORAL | Status: AC
Start: 2022-09-16 — End: 2022-09-16
  Administered 2022-09-16: 10 mg via ORAL
  Filled 2022-09-16: qty 1

## 2022-09-16 MED ORDER — IMMUNE GLOBULIN (HUMAN) 20 GM/200ML IJ SOLN
0.5000 g/kg | Freq: Once | INTRAMUSCULAR | Status: AC
Start: 2022-09-16 — End: 2022-09-16
  Administered 2022-09-16: 25 g via INTRAVENOUS
  Filled 2022-09-16: qty 200

## 2022-09-16 MED ORDER — ACETAMINOPHEN 325 MG PO TABS
650.0000 mg | ORAL_TABLET | Freq: Once | ORAL | Status: AC
Start: 2022-09-16 — End: 2022-09-16
  Administered 2022-09-16: 650 mg via ORAL
  Filled 2022-09-16: qty 2

## 2022-09-16 NOTE — Telephone Encounter (Signed)
GENERAL INQUIRY:    Caller Information:  Who is calling? Ancillary: Hope  from [facility] is calling on behalf of patient.  Best way to contact: [phone]  What is the reason for the call? Hope calling back stating she received fax from 09/12/22 however missing bottom part of fax, Bridgette Habermann is requesting for form to be re faxed and include visit note 07/09/22. Per Hope OV note from 07/09/22 needs to be resigned due to addendum on note.   Please expedite.   Fax [phone]  Encounter Details:  Is this a duplicate encounter?: No previous documentation found on this issue.  Action required by the office: Please process request  Patient Information:  Is MyChart active?: Yes.  Verification:  Has the inquiry been read verbatim to the caller and verbalizes satisfaction and confirms the above is accurate?: Yes  Caller has been advised this message will be transmitted to the office and can expect a response within the next 24-72 hours during working business days.  Encounter created by Evalyn Casco from the Care Assist Team.  Signature generated from controlled access password on September 16, 2022 at 10:01 AM.

## 2022-09-16 NOTE — Interdisciplinary (Signed)
Non-Chemotherapy Infusion Nursing Note -  [facility]    Tiffany Chen is a 77 year old female who presents for infusion of IVIG 25G     Vitals:    09/16/22 0850 09/16/22 1015 09/16/22 1202   BP: 138/65 117/69 113/63   BP Location: Left arm Right arm Left arm   BP Patient Position: Sitting Sitting Semi-Fowlers   Pulse: 66 70 66   Resp: 16 17 16    Temp: 98 F (36.7 C) 98.2 F (36.8 C) 98 F (36.7 C)   TempSrc: Temporal Temporal Temporal   SpO2: 99%  99%   Weight: 64.6 kg (142 lb 6.7 oz)     Height: 5\' 3"  (1.6 m)       Pain Score: NA (pre med, non-pain or scheduled)  Body surface area is 1.69 meters squared.  Body mass index is 25.23 kg/m.    Pre-treatment nursing assessment:  No problems identified upon assessment.  Patient states she has tolertaed prior IVIG's in the past with no ill effects.  Patient premedicated with tylenol and Zyrtec per pt.'s preference.  PIV accessed on LH,confirmed blood return.Labs drawn per orders.IGg wnl.  IVIG commenced and titrated per orders.VSS checked,stable      Tiffany Chen tolerated treatment well.    Post blood return: Brisk  Post-Flush: Discontinued IV    Patient Education  Learner: Patient  Barriers to learning: No Barriers  Readiness to learn: Acceptance  Method: Explanation    Treatment Education: Information/teaching given to patient including: signs and symptoms of infection, bleeding, adverse reaction(s), symptom control, and when to notify MD.    [name] Prevention Education: Instructed patient to call for assistance.    Pain Education: Patient instructed to contact nurse if pain should develop or if their current pain therapy becomes ineffective.    Response: Verbalizes understanding    Discharge Plan  Discharge instructions given to patient.  Future appointments given and reviewed with treatment plan.  Discharge Mode: Ambulatory  Discharge Time: 1203  Accompanied by: Self  Discharged To: Home     Medications   dextrose 5% infusion (0 mL IntraVENOUS  Stopped 09/16/22 1200)   diphenhydrAMINE (BENADRYL) tablet or capsule 25 mg (25 mg Oral Not given 09/16/22 0953)   acetaminophen (TYLENOL) tablet 650 mg (650 mg Oral Given 09/16/22 0910)   cetirizine (ZYRTEC) tablet 10 mg (10 mg Oral Given 09/16/22 0910)   immune globulin (human) (IgG) 25 g in 250 mL (GAMUNEX-C) infusion (0 g IntraVENOUS Completed 09/16/22 1200)

## 2022-09-17 LAB — HISTOPLASMA QUANT ANTIGEN

## 2022-09-18 ENCOUNTER — Telehealth (INDEPENDENT_AMBULATORY_CARE_PROVIDER_SITE_OTHER): Payer: Self-pay

## 2022-09-18 NOTE — Telephone Encounter (Signed)
Who is calling: Incoming call from patient  Insurance Coverage Verified: Active- in network  Reason for this call:  Pt states she's calling on status for her ileostomy supplies.  The Rx is for only ileostomy supplies per patient.    Action required by office: Please contact caller     Duplicate encounter?  No     Best way to contact:  Patient [phone]      Inquiry has been read verbatim to this caller. Verbalizes satisfaction and confirms the above is accurate: yes      Has been advised this message will be transmitted to office and can expect a response within the next 24-72 hours.

## 2022-09-18 NOTE — Telephone Encounter (Signed)
CALL BACK    Caller Information:  Who is calling? Patient   Best way to contact: Phone: [phone]  What is the reason for the call? Pt calling to check status on prescription signature for [employer]. Pt extremely mad and upset with the duration of time it has taken for her supplies. Pt demanded status while on the line.  This CCMA reached out to LVN Shawn by secure chat with no response "status was busy", attempted to reach by phone, no answer.   Pt is now requesting call back from Dr. Ilene Qua to further discuss and voice her concern in taking 1 month and still not receiving her ileostomy supplies    Please do not close encounters until reason for call is addressed by all parties involved. Call center is unable to document in closed encounters. Thank you     Encounter Details:  Is this a duplicate encounter?: No, pls refer to closed encounter dated 08/30/22  Action required by the office: Please contact caller  Patient Information:  Is MyChart active?: Yes.  Verification:  Has the inquiry been read verbatim to the caller and verbalizes satisfaction and confirms the above is accurate?: Yes  Caller has been advised this message will be transmitted to the office and can expect a response within the next 24-72 hours during working business days.  Encounter created by Mortimer Fries from the Care Assist Team.  Signature generated from controlled access password on September 18, 2022 at 1:07 PM.

## 2022-09-18 NOTE — Telephone Encounter (Signed)
Faxed back signed 07/09/2022 note. And refaxed supply order

## 2022-09-18 NOTE — Telephone Encounter (Signed)
See 09/09 encounter. [employer] needed addended notes signed which was faxed back today.

## 2022-09-18 NOTE — Addendum Note (Signed)
Encounter addended by: Ladoris Gene on: 09/18/2022 4:28 PM   Actions taken: Flowsheet accepted, Charge Capture section accepted

## 2022-09-18 NOTE — Telephone Encounter (Signed)
CALL BACK    Caller Information:  Who is calling? Patient   Best way to contact: Phone: [phone]  What is the reason for the call? Pt calling to check status on prescription signature for [employer]. Pt extremely mad and upset with the duration of time it has taken for her supplies. Pt demanded status while on the line.  This CCMA reached out to LVN Shawn by secure chat with no response "status was busy", attempted to reach by phone, no answer.   Pt is now requesting call back from Dr. Ilene Qua to further discuss and voice her concern in taking 1 month and still not receiving her ileostomy supplies  Encounter Details:  Is this a duplicate encounter?: No previous documentation found on this issue.  Action required by the office: Please contact caller  Patient Information:  Is MyChart active?: Yes.  Verification:  Has the inquiry been read verbatim to the caller and verbalizes satisfaction and confirms the above is accurate?: Yes  Caller has been advised this message will be transmitted to the office and can expect a response within the next 24-72 hours during working business days.  Encounter created by Mortimer Fries from the Care Assist Team.  Signature generated from controlled access password on September 18, 2022 at 1:07 PM.

## 2022-09-19 NOTE — Telephone Encounter (Signed)
New prescription received. Filled out, signed, faxed back.     Patient came into clinic to find out what the delay was. Informed her with update regarding fax. Provided her with copy of the prescription per patients request.

## 2022-09-19 NOTE — Telephone Encounter (Signed)
Called [employer] to follow up. Per [employer], they received faxed prescription but when they received the fax, the prescription was cut off.     [employer] with re fax the prescription form for [place] to re-sign and fax over.

## 2022-09-20 NOTE — Telephone Encounter (Signed)
Prescription and OV note signed by NP Niloufar Rajaei. Faxed and scanned in pts' chart.

## 2022-09-20 NOTE — Telephone Encounter (Signed)
Spoke to rep from [employer].    Prescription was signed by Dr. Clovis Riley) but the name on the prescription was Dr. Shaune Leeks (mentor).  Informed rep that PCP will not be in clinic until 09/17. Provided out NP's information (Niloufar R, NP) [employer] will be re-faxing prescription form updated with NP's information. NP will need to sign and date  Addended notes from 07/02 had missing pages, will need to re-print, NP to sign and date, then re-fax

## 2022-09-20 NOTE — Telephone Encounter (Signed)
CALL BACK    Caller Information:  Who is calling? Ancillary: Hope  from [facility]  is calling on behalf of patient.   Best way to contact: [phone]   What is the reason for the call? Hope called saying that the form received from Ines Bloomer is still invalid. She is asking to peak to [facility] direct.   Secured Musician and he asked for a number to call. Tel: [phone] .  Encounter Details:  Is this a duplicate encounter?: Yes, please refer to closed encounter dated 09/16/22  Action required by the office: Please contact caller  Patient Information:  Is MyChart active?: Yes.  Verification:  Has the inquiry been read verbatim to the caller and verbalizes satisfaction and confirms the above is accurate?: Yes  Caller has been advised this message will be transmitted to the office and can expect a response within the next 24-72 hours during working business days.  Encounter created by Gerlean Ren. Asuncion from the Care Assist Team.  Signature generated from controlled access password on September 20, 2022 at 11:05 AM.

## 2022-09-20 NOTE — Telephone Encounter (Signed)
Hi,  Yes, definitely,  is it here ?    Thank you,  Mickael Mcnutt,Nurse practitioner

## 2022-09-29 ENCOUNTER — Encounter (INDEPENDENT_AMBULATORY_CARE_PROVIDER_SITE_OTHER): Payer: Self-pay

## 2022-09-29 DIAGNOSIS — F32A Depression, unspecified: Secondary | ICD-10-CM

## 2022-09-30 LAB — FUNGAL CULTURE: Fungus Culture Result: NO GROWTH

## 2022-09-30 NOTE — Telephone Encounter (Signed)
From: [name]  To: Judi Cong, MD  Sent: 09/29/2022 1:03 PM PDT  Subject: New prescription    Please provide renewal prescription for FLUOXETINE, Prozac 40mg  to [facility].

## 2022-09-30 NOTE — Telephone Encounter (Signed)
fluoxetine (PROZAC) 40 MG capsule     Medication requested Requested Prescriptions     Pending Prescriptions Disp Refills    fluoxetine (PROZAC) 40 MG capsule 90 capsule 3     Sig: Take 1 capsule (40 mg) by mouth daily.         Last Filled Date 02/12/22   Quantity Last Filled  90 capsules           Visit within 3 months?  Yes, this last visit with PCP within 3 months.             Is the pharmacy correct?  yes -    [address] [place], [place] - [address]  [address]  [place]  [place] [address]  Phone: [phone] Fax: [phone]    [facility] [address] [place], [place] - [address]  [address]  [place] [address]  Phone: [phone] Fax: [phone]    [facility] - [place], [place] - [address]  [address]  [address]  [place] [address]  Phone: [phone] Fax: [phone]      Is the prescription type correct?  yes -      Established with: Monia Sabal   Future Appointments   Date Time Provider Department Center   10/14/2022  9:00 AM [facility] INFUSION, 9TH FLOOR [facility] Onc Chem [facility]   10/16/2022 11:00 AM [name], MD MOS Pulm Gen MOS   11/04/2022  9:00 AM [facility] INFUSION, 9TH FLOOR [facility] Onc Chem [facility]   11/11/2022  1:00 PM PROVIDER B PMC ANES PMC [facility]   11/19/2022  2:15 PM Fisher, Alcide Evener, RD UTC MIS [place] UTC   11/25/2022 12:30 PM Grant Ruts, MD [facility] Ophth [facility]   12/02/2022  9:00 AM [facility] INFUSION, 9TH FLOOR [facility] Onc Chem [facility]   12/10/2022  1:00 PM Donnalee Curry, MD MON MIS MON   12/10/2022  2:00 PM Andreas Newport, AuD [facility] Hns Aud [facility]   12/24/2022  2:00 PM Dorian Furnace, MD MON Srg Gen MON   01/29/2023 11:00 AM [name], MD MOS Pulm Gen MOS   01/29/2023  2:30 PM Malachy Moan, MD UPC Allergy UPC   06/09/2023  2:00 PM Andreas Newport, AuD [facility] Aud [facility]     Last office visit: 07/09/2022  Next office visit: Visit date not found

## 2022-10-01 NOTE — Telephone Encounter (Signed)
MACRN: pt scheduled

## 2022-10-02 MED ORDER — FLUOXETINE HCL 40 MG OR CAPS
40.0000 mg | ORAL_CAPSULE | Freq: Every day | ORAL | 3 refills | Status: DC
Start: 2022-10-02 — End: 2023-11-03

## 2022-10-02 NOTE — Telephone Encounter (Signed)
Medication refilled, thank you!

## 2022-10-09 ENCOUNTER — Telehealth (HOSPITAL_BASED_OUTPATIENT_CLINIC_OR_DEPARTMENT_OTHER): Payer: Self-pay

## 2022-10-09 DIAGNOSIS — D809 Immunodeficiency with predominantly antibody defects, unspecified: Secondary | ICD-10-CM

## 2022-10-09 NOTE — Telephone Encounter (Signed)
Dr. Selena Batten,     Tiffany Chen is scheduled for infusion of IVIG on 10/7.     The patient has a scheduled clinic visit on 01/29/2023.    The following items are currently outstanding:    Treatment plan date is outside of the plan's treatment window  Please adjust the date to reflect the patient's scheduled appointment on 10/7  Last dose was given 9/9, if this is Q4 weeks it would be due on 10/7.  The date on the plan is 10/28, which is 8 weeks.     *As a reminder of our infusion center process, if an unsigned order and/or consent are not addressed 1 business day before the infusion center appointment, the patient appointment will be cancelled.      Thank you,   Daisy Lazar, RN

## 2022-10-09 NOTE — Telephone Encounter (Signed)
Updated order to 10/7  Unable to update initially on original Oct date- removed day from cycle

## 2022-10-14 ENCOUNTER — Ambulatory Visit
Admission: RE | Admit: 2022-10-14 | Discharge: 2022-10-14 | Disposition: A | Discharge status: Home / Self Care | Payer: Medicare Other | Attending: Allergy | Admitting: Allergy

## 2022-10-14 VITALS — BP 109/63 | HR 70 | Temp 98.1°F | Resp 16 | Ht 63.0 in | Wt 143.7 lb

## 2022-10-14 DIAGNOSIS — J479 Bronchiectasis, uncomplicated: Secondary | ICD-10-CM | POA: Insufficient documentation

## 2022-10-14 DIAGNOSIS — E785 Hyperlipidemia, unspecified: Secondary | ICD-10-CM | POA: Insufficient documentation

## 2022-10-14 DIAGNOSIS — E1169 Type 2 diabetes mellitus with other specified complication: Secondary | ICD-10-CM | POA: Insufficient documentation

## 2022-10-14 DIAGNOSIS — D809 Immunodeficiency with predominantly antibody defects, unspecified: Secondary | ICD-10-CM | POA: Insufficient documentation

## 2022-10-14 LAB — COMPREHENSIVE METABOLIC PANEL, BLOOD
ALT (SGPT): 24 U/L (ref 0–33)
AST (SGOT): 28 U/L (ref 0–32)
Albumin: 4.1 g/dL (ref 3.5–5.2)
Alkaline Phos: 67 U/L (ref 40–130)
Anion Gap: 15 mmol/L (ref 7–15)
BUN: 18 mg/dL (ref 8–23)
Bicarbonate: 20 mmol/L — ABNORMAL LOW (ref 22–29)
Bilirubin, Tot: 0.4 mg/dL (ref <1.2)
Calcium: 9.5 mg/dL (ref 8.5–10.6)
Chloride: 101 mmol/L (ref 98–107)
Creatinine: 0.96 mg/dL — ABNORMAL HIGH (ref 0.51–0.95)
Glucose: 151 mg/dL — ABNORMAL HIGH (ref 70–99)
Potassium: 4.5 mmol/L (ref 3.5–5.1)
Sodium: 136 mmol/L (ref 136–145)
Total Protein: 6.8 g/dL (ref 6.0–8.0)
eGFR Based on CKD-EPI 2021 Equation: >60 mL/min/{1.73_m2}

## 2022-10-14 LAB — LIPID(CHOL FRACT) PANEL, BLOOD
Cholesterol: 115 mg/dL (ref <200)
HDL-Cholesterol: 55 mg/dL
LDL-Chol (Calc): 40 mg/dL (ref <160)
Non-HDL Cholesterol: 60 mg/dL
Triglycerides: 114 mg/dL (ref 10–170)

## 2022-10-14 LAB — IGG, BLOOD: IGG: 888 mg/dL (ref 700–1600)

## 2022-10-14 MED ORDER — ACETAMINOPHEN 325 MG PO TABS
650.0000 mg | ORAL_TABLET | Freq: Once | ORAL | Status: AC
Start: 2022-10-14 — End: 2022-10-14
  Administered 2022-10-14: 650 mg via ORAL
  Filled 2022-10-14: qty 2

## 2022-10-14 MED ORDER — DIPHENHYDRAMINE HCL 25 MG OR TABS OR CAPS CUSTOM
25.0000 mg | ORAL_CAPSULE | Freq: Once | ORAL | Status: DC | PRN
Start: 2022-10-14 — End: 2022-10-14

## 2022-10-14 MED ORDER — IMMUNE GLOBULIN (HUMAN) 20 GM/200ML IJ SOLN
0.5000 g/kg | Freq: Once | INTRAMUSCULAR | Status: AC
Start: 2022-10-14 — End: 2022-10-14
  Administered 2022-10-14: 25 g via INTRAVENOUS
  Filled 2022-10-14: qty 200

## 2022-10-14 MED ORDER — DEXTROSE 5 % IV SOLN
INTRAVENOUS | Status: DC
Start: 2022-10-14 — End: 2022-10-14

## 2022-10-14 MED ORDER — CETIRIZINE HCL 10 MG OR TABS
10.0000 mg | ORAL_TABLET | Freq: Once | ORAL | Status: AC
Start: 2022-10-14 — End: 2022-10-14
  Administered 2022-10-14: 10 mg via ORAL
  Filled 2022-10-14: qty 1

## 2022-10-14 NOTE — Interdisciplinary (Signed)
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 Bronchiectasis without complication (CMS-HCC)  J47.9 494.0   3. 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   Access:  R AC PIV  Treatment:  Patient tolerated infusion well and without complication.   PIV flushed, removed, and pressure dressing applied  Treatment Education:   Information/teaching given to patient including: signs and symptoms of infection, bleeding, adverse reaction(s), symptom control, and when to notify MD.  Discharge Plan:  Return in 29 days (on 11/12/2022) for NONCHEMOTHERAPY.  Discharge instructions given to patient.  Future appointments:date given and reviewed with treatment plan. and request submitted for 11/5 and to cancel infusion appointment for 10/28.  Discharge Mode: Ambulatory  Discharge Time: 1130  Accompanied by: Self  Discharged To: Home   Treatment Administration:  Medications   dextrose 5% infusion (0 mL IntraVENOUS Stopped 10/14/22 1125)   diphenhydrAMINE (BENADRYL) tablet or capsule 25 mg (has no administration in time range)   acetaminophen (TYLENOL) tablet 650 mg (650 mg Oral Given 10/14/22 0854)   cetirizine (ZYRTEC) tablet 10 mg (10 mg Oral Given 10/14/22 0854)   immune globulin (human) (IgG) 25 g in 250 mL (GAMUNEX-C) infusion (0 g IntraVENOUS Completed 10/14/22 1123)

## 2022-10-15 NOTE — Progress Notes (Signed)
Pulmonary Clinic Note    This patient was referred by Laverda Page, MD  [address]  [facility] [address]  [place],  [place] [address]    Chief Complaint:  Tiffany Chen is a 77 year old female who is in the clinic today with a chief complaint of follow up.    ID: 77 year old with cavitary MAC lung ifnection S/P completion of antibiotic treatment 06/08/2022, S/P DA VINCI XI-Right Robotic upper lobectomy with middle and lower  lobe wedge resection on 03/20/2021    History of Present Illness from the Initial visit 07/2020:   Establishing care for cavitary MAC lung infection.     PMH of bronchiectasis, cavitary MAC lung infection, ruptured diverticulitis S/P colostomy in 2009, diabetes, ICH S/P embolization.     MAC history- treatment courses were all azithromycin 500mg , Rifampin 600mg  , ethambutol 1600mg  three times weekly:   - 2018- 01/19/2018  - 05/25/2018- 10/22/2019  - 12/22/2019- 02/16/2020.   - 08/14/2020 started azithro/rifampin/ethambutol PLUS Intravenous amikacin. Amikacin IV replaced by inhaled Arikayce early Novermber 2022.   - 02/21/2021: Clofazimine added  - 06/08/2022: COMPLETED and stopped azithromycin 250mg  daily, rifampin 600mg  daily, Ethambutol 800mg  daily. Inhaled Arikayce daily, clofazimine 100mg  daily    She was being treated by Dr. Berkley Harvey for MAC lung infection. She reports that daily dosing of medication and adding a 4th antibiotic were not discussed before 2022, despite her disease being cavitary.     In late 2021, CT scan showed enlarging cavity. December 2021 a bronch was performed but no MAC growth. Her doctor restarted azithro/Rifampin/ethmabutol based on imaging.   Feb 2022 she saw Dr. Johnnette Barrios who stopped the antibiotics based on the lack of microbiologic data. Performed repeat bronch 04/28/2020 which has smear AFB 3+ and culture MAC (drug susc pending)     Over the last several months her symptoms have consisted of significant daily coughing, causing night time awakening most nights, night  sweats every night, and weight loss >30lbs. Also significant fatigue. Dr. Merryl Hacker prescribed nebulized saline but she did not start it.   She tried to walk several miles each day despite her fatigue.     Significant heartburn if she lays shortly after eating.     Miscellaneous:   Never smoker.   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   Her husband died after contracting COVID in the hospital.   Moved in with her brother in [place] several months ago.   Her brother is Vegan.   No hot tubs, no swimming pools.     Last visit 04/17/2022:   - 05/20/2022 CT scan showed stable parenchyma but persistently significantly enlarged mediastinal LNs. REferred for bronchoscopy,  - 06/08/2022: COMPLETED and stopped azithromycin 250mg  daily, rifampin 600mg  daily, Ethambutol 800mg  daily. Inhaled Arikayce daily, clofazimine 100mg  daily  - 07/22/2022: PET CT showed several FDG avid mediastinal LNs.   - 08/26/2022: visit with NP Geraldine Contras.   - 08/29/2022: underwent bronchoscopy with biopsy of the LNs. Showed necrotizing granulomas. No malignancy.     Her energy levels had improved significantly and her night sweats had resolved.   Since antibiotic treatment of her MAC, coughing had decreased significantly. She hardly coughs Weight is stable.   GI SURGERY SCHEDULED 11/18/024 (take down of colostomy and potentially hiatal hernia repair)    No cough or phlegm or night sweeats    Airway clearance:   Hypertonic saline    ROS as above, otherwise negative.  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)    Mycobacterium avium complex (CMS-HCC)    s/p Robotic right upper lobectomy with en bloc lower and middle lobe wedge resection, middle lobe  wedge resection    Status post Hartmann procedure (CMS-HCC)    Ventral hernia without obstruction or gangrene    Colostomy status (CMS-HCC)    Status post cataract extraction and insertion of intraocular lens of right eye    Status post cataract extraction and insertion of intraocular lens of left eye    Type 2 diabetes mellitus with other specified complication, unspecified whether long term insulin use (CMS-HCC)    Pseudophakia of both eyes    Posterior vitreous detachment    S/P repair of ventral hernia    Ileostomy in place (CMS-HCC)    Unspecified mood (affective) disorder (CMS-HCC)    Hyperlipidemia    Nonruptured cerebral aneurysm    Recurrent major depression in remission (CMS-HCC)    Mediastinal lymphadenopathy     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     Past Surgical History:   Procedure Laterality Date    BRONCHOSCOPY  12/2020    CESAREAN SECTION, LOW TRANSVERSE      COLOSTOMY      CRANIOTOMY      LUNG LOBECTOMY       Allergies   Allergen Reactions    Walnuts Swelling      Tongue swollen and burn     Current Outpatient Medications on File Prior to Visit   Medication Sig Dispense Refill    atorvastatin (LIPITOR) 20 MG tablet Take 1 tablet (20 mg) by mouth daily. 90 tablet 1    biotin 1000 MCG tablet Take 1 tablet (1,000 mcg) by mouth daily.      blood glucose meter PHARMACY: Please dispense insurance approved bluetooth enable meter. 1 each 0    ferrous sulfate 325 (65 Fe) MG tablet Take 1 tablet (325 mg) by mouth daily.      fluoxetine (PROZAC) 40 MG capsule Take 1 capsule (40 mg) by mouth daily. 90 capsule 3    glucose blood test strip 1 strip by Other route every morning (before breakfast). 100 strip 2    glucose blood test strip 1 strip by Other route 2 times daily (before meals). 100 strip 11  lancets 1 each by Other route every morning (before breakfast). 100 each 2    lancets 1 each by Other route 2 times daily (before meals). 100 Lancet. 5    metFORMIN (GLUCOPHAGE) 500 mg tablet Take 1 tablet (500 mg) by mouth every morning AND 2 tablets (1,000 mg) every evening. 270 tablet 1    Multiple Vitamin (MULTIVITAMIN) TABS tablet Take 1 tablet by mouth daily.      omeprazole (PRILOSEC) 20 MG capsule Take 1 capsule (20 mg) by mouth every morning (before breakfast). 90 capsule 3    [DISCONTINUED] psyllium (METAMUCIL) WAFR Take 2 Wafers by mouth every 12 hours as needed (As needed to maintain ileostomy output <1.2L). 24 packet 0    ramipril (ALTACE) 2.5 MG capsule Take 1 capsule (2.5 mg) by mouth daily. 90 capsule 3    sodium chloride 7 % NEBU USE 1 VIAL VIA NEBULIZER  EVERY 12 HOURS 720 mL 3    umeclidinium-vilanterol (ANORO ELLIPTA) 62.5-25 MCG/ACT inhaler Inhale 1 puff by mouth daily. 60 each 11    VENTOLIN HFA 108 (90 Base) MCG/ACT inhaler USE 2 INHALATIONS BY MOUTH  EVERY 6 HOURS AS NEEDED FOR WHEEZING 72 g 3    vitamin D3 125 MCG (5000 UT) capsule Take 1 capsule (5,000 Units) by mouth every 24 hours.      vitamin E 400 UNIT capsule Take 1 capsule (400 Units) by mouth daily.        No current facility-administered medications on file prior to visit.     Family History   Problem Relation Name Age of Onset    Cancer Mother Denton Lank ngs and throat    Heart Disease Mother Joya Martyr         Congrestive heart failure due to cancer    Hypertension Mother Joya Martyr     Psychiatry Mother Joya Martyr         Bi polar, manic depressive    Cancer Father Jamal Maes         Prostate    Diabetes Father Jamal Maes     Stroke Father Jamal Maes     Stroke M Grandmother Winifred Larita Fife     Cancer Brother Vallarie Mare         Skin (many locations)    Diabetes Brother Vallarie Mare      Social History     Socioeconomic History    Marital status: Widowed   Tobacco Use    Smoking status: Never    Smokeless tobacco: Never   Vaping Use    Vaping status: Never Used   Substance and Sexual Activity    Alcohol use: Yes     Comment: glass of wine every other month    Drug use: Not Currently    Sexual activity: Not Currently   Other Topics Concern    Military Service No    Blood Transfusions No    Caffeine Concern No    Occupational Exposure No    Hobby Hazards Yes     Comment: Gardening, MAC    Sleep Concern No    Stress Concern Yes     Comment: Husband death Cov33, Nov 20, 2018, sold home, rehomed pets. [place]    Weight Concern No    Special Diet Yes    Back Care No    Exercises Regularly Yes    Bike Helmet Use No    Seat Belt Use No    Performs  Self-Exams No   Social History Narrative    Lives with brother Fayrene Fearing at home. Sister also lives in [facility]     Social Determinants of Health     Food Insecurity: No Food Insecurity (07/09/2022)    Hunger Vital Sign     Worried About Running Out of Food in the Last Year: Never true     Ran Out of Food in the Last Year: Never true   Transportation Needs: No Transportation Needs (07/09/2022)    PRAPARE - Therapist, art (Medical): No     Lack of Transportation (Non-Medical): No   Intimate Partner Violence: Low Risk  (03/25/2022)    [place] IPV     Have you ever  been emotionally or physically abused by your partner or someone important to you?: No     Within the last year have you been hit, slapped, kicked or otherwise physically hurt by someone?: No     Within the last year has anyone forced you to have sexual activities?: No     Are you afraid of your spouse or partner you listed above?: No   Housing Stability: Unknown (07/09/2022)    Housing Stability Vital Sign     Unable to Pay for Housing in the Last Year: No     Unstable Housing in the Last Year: No       Examination:  Vitals:    10/16/22 1100   BP: 120/60   BP Location: Left arm   BP Patient Position: Sitting   BP cuff size: Regular   Pulse: 72   Temp: 96.6 F (35.9 C)   TempSrc: Temporal Artery   SpO2: 99%   Weight: 65.3 kg (144 lb)       General: The patient is well appearing, sitting comfortably on the examination table with no evidence of respiratory distress.  Heart: regular rate and rhythm, no murmurs rubs or gallops, normally split S2, no accentuation of P2  Lungs: clear, no wheezing  Skin: no rashes  Neuro: Slight tremor in the left fingers at rest    Laboratory Review:  Lab Results   Component Value Date    WBC 7.0 09/10/2022    HGB 12.6 09/10/2022    HCT 37.9 09/10/2022    PLT 299 09/10/2022     Lab Results   Component Value Date    NA 136 10/14/2022    K 4.5 10/14/2022    CL 101 10/14/2022    BICARB 20 (L) 10/14/2022    BUN 18 10/14/2022    CREAT 0.96 (H) 10/14/2022    GLU 151 (H) 10/14/2022    [place] 9.5 10/14/2022        Pulmonary Function Test Review:  04/28/2020 (care everywhere): noraml spito, normal volumes, mildly reduced DLCO    Radiology Review:  CT chest 04/13/2020:   CONCLUSION:   1. 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   2. Cavitary lesion in the periphery of the right lung has increased in size measuring 2.7 cm, although may have a slightly less thickened and nodular wall.   3.  Diffuse bronchiectasis most  pronounced in the right upper lobe.     CT chest 06/22/2021:   IMPRESSION:  1. Post interval right upper lobectomy with a small residual right hydropneumothorax.     2. Scattered tree-in-bud airspace opacities throughout the lungs, similar to slightly improved.  Previously seen patchy groundglass  opacities within the lungs appear improved.  3. Mediastinal lymphadenopathy.  This appears slightly increased in comparison to the prior study.    Microbiology  BAL 04/28/2020 (from Dr. Merryl Hacker): AFB smear 3+, culture MAC (drug susceptibly testing sent to [facility]- pending)    Sputum - AFB  08/01/20: AFB smear negative, Cx MAC  08/03/20: AFB smear 1+, Cx MAC (MIS clarithro 0.5, amikacin 4)  08/07/20: AFB smear negative, Cx MAC  10/25: AFB smear 1+, Cx MAC  11/1, 11/09/2020: AFB smear negative, Cx MAC  12/25/2020: AFB smear 2+, Culture MAC  01/30/2021: AFB smear and culture negative  02/06/2021: AFB smear negative, culture heavy MAC  02/12/2021: AFB smear and culture negative  02/15/2021: AFB smear negative, culture moderate MAC  8/22, 08/31/2021: AFB smear negative, culture negative  Sputum-induced 1/8, 01/23/2022: AFB smear and culture negative  Bronch washing 08/29/2022: AFB smear negative, culture no growth to date. Aspergillus galactomannan negative.   BIOPSY mediastinal LNs 08/29/2022: necrotizing and non necrotizing granulomas.     Sputum - Routine   08/01/20: NL Resp Flora   08/03/20: NL Resp Flora  08/07/20: NL Resp Flora    EKG   08/23/2020: sinus rhythm with PAC     Impression/Plan:    Problem List:   - cavitary MAC lung infection. 08/14/2020 started azithro/rifampin/ethambutol PLUS Intravenous amikacin. Amikacin IV replaced by inhaled Arikayce early Novermber 2022. Clofazimine added 02/21/2021  - S/P DA VINCI XI-Right Robotic upper lobectomy with middle and lower  lobe wedge resection on 03/20/2021.   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 Amikacin IV replaced by inhaled Arikayce early Novermber 2022. Clofazimine added 02/21/2021. Stopped antibiotics on June 1st, 2024. Will be considered successful treatment.   - referred to pulmonary rehab to assist with recovery from lobectomy. She attended a few sessions then canceled. Preferred to walk daily outdoors.   - EGD with [facility] 02/01/2021 showed significant reflux. Referred to Gen surgery for consideration of surgical intervention to decreae risk of recurrent lung infections.    > Still recommend hiatal hernia repair and fundoplication at time of next abdominal surgery. Her cavitary MAC lung infection was severe and complicated and anything that would reduce the chance of recurrence would be highly recommended.   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 Encouraged exercise.   - Follow up 2 months with NP Geraldine Contras and 4 months with me      Patient Instructions   - continue nebulized saline and exercise  - Follow up 2 months with NP Geraldine Contras and 6 months with me    Total time spent :   - with patient, examining, history taking, planning management, counseling  - reviewing images  - records review  - interpretation of results  - documentation       Orders:  No orders of the defined types were placed in this encounter.  No orders of the  defined types were placed in this encounter.    No orders of the defined types were placed in this encounter.

## 2022-10-16 ENCOUNTER — Encounter (HOSPITAL_BASED_OUTPATIENT_CLINIC_OR_DEPARTMENT_OTHER): Payer: Self-pay | Admitting: Critical Care Medicine

## 2022-10-16 ENCOUNTER — Ambulatory Visit: Payer: Medicare Other | Attending: Nurse Practitioner | Admitting: Critical Care Medicine

## 2022-10-16 VITALS — BP 120/60 | HR 72 | Temp 96.6°F | Wt 144.0 lb

## 2022-10-16 DIAGNOSIS — J984 Other disorders of lung: Secondary | ICD-10-CM | POA: Insufficient documentation

## 2022-10-16 DIAGNOSIS — J479 Bronchiectasis, uncomplicated: Secondary | ICD-10-CM | POA: Insufficient documentation

## 2022-10-16 DIAGNOSIS — A319 Mycobacterial infection, unspecified: Secondary | ICD-10-CM | POA: Insufficient documentation

## 2022-10-16 NOTE — Patient Instructions (Addendum)
-   continue nebulized saline and exercise  - Follow up 2 months with NP Geraldine Contras and 6 months with me

## 2022-10-16 NOTE — Addendum Note (Signed)
Encounter addended by: Ulis Rias on: 10/16/2022 6:16 AM   Actions taken: Charge Capture section accepted

## 2022-10-17 ENCOUNTER — Encounter (HOSPITAL_COMMUNITY): Payer: Self-pay

## 2022-10-25 LAB — AFB CULTURE W/STAIN
AFB Culture Result: NO GROWTH
AFB Smear Result: NEGATIVE

## 2022-10-28 ENCOUNTER — Telehealth (INDEPENDENT_AMBULATORY_CARE_PROVIDER_SITE_OTHER): Payer: Self-pay | Admitting: Internal Medicine

## 2022-10-28 NOTE — Telephone Encounter (Signed)
Who is calling: Incoming call from patient  Insurance Coverage Verified: Active- in network  Reason for this call: pt is calling back to check the status of her appt with Dr. Lahoma Rocker. Pt was told by Dr. Ilene Qua that she highly recommended this pcp and she would have the office call her. Pt states office has not called her for that appt. CCA did not see any documentation in chart. CCA sending encounter to the front desk, please assist with scheduling     Action required by office: Please contact caller     Duplicate encounter? No previous documentation found on this issue.     Best way to contact: [phone]  Alternative: Mychart    Inquiry has been read verbatim to this caller. Verbalizes satisfaction and confirms the above is accurate: yes      Has been advised this message will be transmitted to office and can expect a response within the next 24-72 hours.

## 2022-10-28 NOTE — Telephone Encounter (Signed)
Pt scheduled to est care with Dr. Lahoma Rocker.    Future Appointments   Date Time Provider Department Center   11/11/2022  1:00 PM PROVIDER B PMC ANES PMC Aneth Pr PMC   11/12/2022  9:00 AM [facility] INFUSION, 9TH FLOOR [facility] Onc [facility] [place]   11/19/2022  2:15 PM Fisher, Alcide Evener, RD UTC MIS [place] UTC   11/25/2022 12:30 PM Grant Ruts, MD [facility]   12/02/2022  9:00 AM [facility] INFUSION, 9TH FLOOR [facility] Onc Chem [place]   12/10/2022  1:00 PM Donnalee Curry, MD MON MIS MON   12/10/2022  2:00 PM Andreas Newport, AuD [facility] Hns Aud [facility]   12/13/2022  1:45 PM Cameron Proud, NP MOS Pulm Gen MOS   12/24/2022  2:00 PM Dorian Furnace, MD MON Srg Gen MON   12/26/2022 10:30 AM Fredric Mare, DO U[facility] Int Med [facility]   01/29/2023  2:30 PM Selena Batten Sharman Crate, MD UPC Allergy UPC   04/16/2023 11:30 AM [name], MD MOS Pulm Gen MOS   06/09/2023  2:00 PM Andreas Newport, AuD [facility] Hns [place] [facility]

## 2022-11-04 ENCOUNTER — Ambulatory Visit: Payer: Medicare Other

## 2022-11-11 ENCOUNTER — Ambulatory Visit (INDEPENDENT_AMBULATORY_CARE_PROVIDER_SITE_OTHER): Payer: Medicare Other | Admitting: Nurse Practitioner

## 2022-11-11 ENCOUNTER — Encounter (INDEPENDENT_AMBULATORY_CARE_PROVIDER_SITE_OTHER): Payer: Self-pay | Admitting: Nurse Practitioner

## 2022-11-11 DIAGNOSIS — Z01818 Encounter for other preprocedural examination: Secondary | ICD-10-CM

## 2022-11-11 MED ORDER — VITAMIN B 12 PO: ORAL | Status: AC

## 2022-11-11 NOTE — Anesthesia Preprocedure Evaluation (Addendum)
ANESTHESIA PRE-OPERATIVE EVALUATION    Patient Information    Name: Tiffany Chen    MRN: [id number]    DOB: 11-Jul-1945    Age: 77 year old    Sex: female  Procedure(s):  Laparoscopic hiatal hernia repair, gastric fundoplication, intraoperative upper endoscopy, possible mesh placement  COMPLEX ILEOSTOMY, TAKE-DOWN      Pre-op Vitals:   There were no vitals taken for this visit.        Primary language spoken:  English    ROS/Medical History:      History of Present Illness: 77 yo f sf; Laparoscopic hiatal hernia repair, gastric fundoplication, intraoperative upper endoscopy, possible mesh placement Broderick, Lewanda Rife, MD  HX GERD DOS 11/25/22 [facility]      pmhx: history of bronchiectasis     HTN, NIDDM, RECENT A1C 6.2% 07/2022      General:  negative for General ROS  Mets=4  Cardiovascular:  no CAD/Angina/MI/CABG/Stents, Anti-platelet drugs: No  hypertension,  no dysrhythmias,  no pacemaker,  no ICD,  Denies cp   EKG SR 03/2022    Anesthesia History:  negative anesthesia history ROS   Pulmonary:   no asthma,  no COPD,  no home oxygen use,  no sleep apnea,  no recent URI/pneumonia,  history of bronchiectasis, , cavitary MAC lung infection, ruptured diverticulitis s/p colostomy 2009, ICH s/p embolization, gerd , tremors, hld, niddm   . cavitary MAC lung ifnection S/P completion of antibiotic treatment 06/08/2022, S/P DA VINCI XI-Right Robotic upper lobectomy with middle and lower  lobe wedge resection on 03/20/2021 08/29/2022: underwent bronchoscopy with biopsy of the LNs. Showed necrotizing granulomas. No malignancy.    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 ---followed [facility] has yearly scans),  no seizures,  no neuromuscular disease,  psychiatric history (depression),   Hematology/Oncology:   history of cancer  (skin),      GI/Hepatic:  GERD,  no esophageal varices,  no liver disease,  no pancreatic disease,   Infectious Disease:  tuberculosis (mac),     Renal:  negative renal ROS   Endocrine/Other:  diabetes, oral meds only, well controlled,   no history of thyroid disease,   no steroid use,    no arthritis,   no back pain,   A1c ordered dos    Pregnancy History:   Pediatrics:         Pre Anesthesia Testing (PCC/CPC) notes/comments:    [facility] Test & records reviewed by [facility] Provider.                                       Last OSA (STOP BANG) Score:   No data recorded    Past Medical History:   Diagnosis Date   . Acquired tracheobronchomegaly with bronchiectasis (CMS-HCC) 05/22/2020   . Age-related nuclear cataract of left eye 10/15/2021   . Cavitary lesion of lung 07/24/2020   . Combined forms of age-related cataract of both eyes 10/15/2021   . Depression    . DM (diabetes mellitus) (CMS-HCC)    . Gastroesophageal reflux disease, unspecified whether esophagitis present 10/12/2020    Added automatically from request for surgery [id number]   . 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     Past Surgical History:   Procedure Laterality Date   . BRONCHOSCOPY  12/2020   . CESAREAN SECTION, LOW TRANSVERSE     . COLOSTOMY     . CRANIOTOMY     . LUNG LOBECTOMY       none     Socioeconomic History   . Marital status: Widowed   Tobacco Use   . Smoking status: Never   . Smokeless tobacco: Never   Vaping Use   . Vaping status: Never Used   Substance and Sexual Activity    . Alcohol use: Yes     Comment: glass of wine every other month   . Drug use: Not Currently   . Sexual activity: Not Currently   Other Topics Concern   . Military Service No   . Blood Transfusions No   . Caffeine Concern No   . Occupational Exposure No   . Hobby Hazards Yes     Comment: Gardening, MAC   . Sleep Concern No   . Stress Concern Yes     Comment: Husband death Cov82, December 06, 2018, sold home, rehomed pets. [place]   . Weight Concern No   . Special Diet Yes   . Back Care No   . Exercises Regularly Yes   . Bike Helmet Use No   . Seat Belt Use No   . Performs Self-Exams No   Social History Narrative    Lives with brother Fayrene Fearing at home. Sister also lives in [facility]     Social Determinants of Health     Food Insecurity: No Food Insecurity (07/09/2022)    Hunger Vital Sign    . Worried About Programme researcher, broadcasting/film/video in the Last Year: Never true    . Ran Out of Food in the Last Year: Never true   Transportation Needs: No Transportation Needs (07/09/2022)    PRAPARE - Transportation    . Lack of Transportation (Medical): No    . Lack of Transportation (Non-Medical): No   Intimate Partner Violence: Low Risk  (03/25/2022)     IPV    . Have you ever been emotionally or physically abused by your partner or someone important to you?: No    . Within the last year have you been hit, slapped, kicked or otherwise physically hurt by someone?: No    . Within the last year has anyone forced you to have sexual activities?: No    . Are you afraid of your spouse or partner you listed above?: No   Housing Stability: Unknown (07/09/2022)    Housing Stability Vital Sign    . Unable to Pay for Housing in the Last Year: No    . Unstable Housing in the Last Year: No     Alcohol Use: Not on file       Current Outpatient Medications   Medication Sig Dispense Refill   . atorvastatin (LIPITOR) 20 MG tablet Take 1 tablet (20 mg) by mouth daily. 90 tablet 1   . biotin 1000 MCG tablet Take 1  tablet (1,000 mcg) by mouth daily.     . blood glucose meter  PHARMACY: Please dispense insurance approved bluetooth enable meter. 1 each 0   . ferrous sulfate 325 (65 Fe) MG tablet Take 1 tablet (325 mg) by mouth daily.     . fluoxetine (PROZAC) 40 MG capsule Take 1 capsule (40 mg) by mouth daily. 90 capsule 3   . glucose blood test strip 1 strip by Other route every morning (before breakfast). 100 strip 2   . glucose blood test strip 1 strip by Other route 2 times daily (before meals). 100 strip 11   . lancets 1 each by Other route every morning (before breakfast). 100 each 2   . lancets 1 each by Other route 2 times daily (before meals). 100 Lancet. 5   . metFORMIN (GLUCOPHAGE) 500 mg tablet Take 1 tablet (500 mg) by mouth every morning AND 2 tablets (1,000 mg) every evening. 270 tablet 1   . Multiple Vitamin (MULTIVITAMIN) TABS tablet Take 1 tablet by mouth daily.     [name] omeprazole (PRILOSEC) 20 MG capsule Take 1 capsule (20 mg) by mouth every morning (before breakfast). 90 capsule 3   . ramipril (ALTACE) 2.5 MG capsule Take 1 capsule (2.5 mg) by mouth daily. 90 capsule 3   . sodium chloride 7 % NEBU USE 1 VIAL VIA NEBULIZER  EVERY 12 HOURS 720 mL 3   . umeclidinium-vilanterol (ANORO ELLIPTA) 62.5-25 MCG/ACT inhaler Inhale 1 puff by mouth daily. 60 each 11   . VENTOLIN HFA 108 (90 Base) MCG/ACT inhaler USE 2 INHALATIONS BY MOUTH  EVERY 6 HOURS AS NEEDED FOR WHEEZING 72 g 3   . vitamin D3 125 MCG (5000 UT) capsule Take 1 capsule (5,000 Units) by mouth every 24 hours.     . vitamin E 400 UNIT capsule Take 1 capsule (400 Units) by mouth daily.       Allergies   Allergen Reactions   . Walnuts Swelling     Tongue swollen and burn       Labs and Other Data  Lab Results   Component Value Date    NA 136 10/14/2022    K 4.5 10/14/2022    CL 101 10/14/2022    BICARB 20 (L) 10/14/2022    BUN 18 10/14/2022    CREAT 0.96 (H) 10/14/2022    GLU 151 (H) 10/14/2022    [place] [employer] 9.5 10/14/2022     Lab Results   Component Value Date    AST 28 10/14/2022    ALT 24 10/14/2022    ALK 67 10/14/2022     TP 6.8 10/14/2022    ALB 4.1 10/14/2022    TBILI 0.40 10/14/2022    DBILI <0.2 07/17/2020     Lab Results   Component Value Date    WBC 7.0 09/10/2022    RBC 4.13 09/10/2022    HGB 12.6 09/10/2022    HCT 37.9 09/10/2022    MCV 91.8 09/10/2022    MCHC 33.2 09/10/2022    RDW 13.2 09/10/2022    PLT 299 09/10/2022    MPV 10.7 09/10/2022    SEG 77.6 09/10/2022    LYMPHS 12.8 09/10/2022    MONOS 7.1 09/10/2022    EOS 1.3 09/10/2022    BASOS 0.6 09/10/2022     No results found for: "INR", "PTT"  No results found for: "ARTPH", "ARTPO2", "ARTPCO2"    Anesthesia Plan

## 2022-11-11 NOTE — Patient Instructions (Addendum)
PREOPERATIVE SURGICAL INFORMATION     Your surgery is currently scheduled at [facility] on 11/25/22  The scheduler will be contacting you with the check in time                                                                         FACILITY ADDRESS:    [facility], [place], [address], [place], [place] [address]    Please check in at [employer], [place], 1st floor.     PARKING:    For [facility]:   [employer] (at the end of [address]) or in [phone] (at [employer] and [employer]   Valet: [place] parking is available between 8 a.m. and 4:30 p.m. weekdays at the main entrance to the [employer] on [address]. The cost is the same as self parking. With a handicapped placard, valet parking is free.   [email or web address]       QUESTIONS:    If you have any questions between now and the day of your surgery, please do not hesitate to call:     [facility]: [phone]     DAY OF SURGERY ARRIVAL TIME:    On the day of your Surgery/Procedure, please arrive at the time provided by the surgeon's clinic. If you have any questions regarding your arrival time, please call the surgeon's clinic:      Preoperative Surgical Admissions at [facility]: [phone]        MEDICATION INSTRUCTIONS:       MEDICATION INSTRUCTIONS BEFORE SURGERY/PROCEDURE:      Medications to hold prior to surgery:No--RAMIPRIL / altace -- the morning of : SURGERY   no metformin 11/25/22 surgery         OK to take the following medications as scheduled with a small sip of water on the morning of surgery  Continue all other medications as directed      PLEASE HOLD ALL NSAIDS (non-steroidal anti-inflammatory drugs) SUCH AS advil, aleve, motrin, ibuprofen, relafen, lodine, feldene, Diclofenac, voltaren, indomethacin, naproxen, celebrex, Mobic 7 days before surgery.  Please do not take products that contain  aspirin including Excedrin, Bufferin, Bayer, Alka-Seltzer, Asacol, Fiorinal, Pepto-Bismol (these are just the most common, please check labels to be sure) for 7 days prior to procedure.          Please hold vitamins, supplements, CO-Q10, Glucosamine, herbs & fish oil 7 days before surgery.   Vitamin C, D are ok to take.     It is OK to take acetaminophen (Tylenol) for pain around the time of surgery unless you have liver disease.      AFTER YOUR VISIT WITH [place], IF YOU START TAKING A NEW MEDICATION BEFORE SURGERY, PLEASE CALL us TO MAKE SURE IT IS SAFE TO TAKE & WILL NOT AFFECT YOUR SURGERY.         OSA INSTRUCTIONS:     KOP/HC/JMC: If you use a CPAP machine, please bring the entire machine, including mask and tubing, with you on the day of surgery.    TO DO LIST:             EATING/DRINKING  No solid food after midnight on the day of surgery    OK?to have water, black coffee (NO cream),  up to 3 hours prior to surgery time       Please only choose drinks from this list. Everything else is NOT allowed       Preparing for your Surgery:     Please wear clean loose-fitting clothes and leave valuables at home   Do not shave or remove body hair. Facial shaving is permitted. If you are having head surgery, ask your doctor whether you can shave.  Bring a picture ID and your insurance card, and be prepared to pay your deductible or co-insurance by cash, check, or credit card when you arrive.   If you are going home after your surgery, please make sure to arrange for an adult to drive you home. You CANNOT use UBER or LYFT. If you do not have a ride, your surgery may be cancelled.   If you are on home oxygen and have a portable oxygen tank, bring it with you on the day of the surgery to use when you are discharged to go home.     On The Day of Your Surgery:      Check in at the location mentioned above   COVID testing may be done on arrival to the Pre-op or Procedural areas according to the current CDPH mandate.   If  you are a woman of child bearing age, please note that you may be asked to give a urine sample upon check-in  You will meet your anesthesia and surgery teams in the preoperative holding area before surgery.   Once surgery is over, you will wake up in the recovery room.  If you go home, an adult chaperone will need to stay with you for the first 24 hours after surgery.   Visitor policy during the COVID-19 pandemic is subject to change.No more that two visitors are allowed per patient. Current visitor policy can be found at [email or web address]      A video about what to expect for the day of surgery can be found here:    [email or web address]  Or by searching "You-tube" for [place] before surgery and [place] after surgery     You medical records are available to you at [email or web address] click sign up now.

## 2022-11-12 ENCOUNTER — Ambulatory Visit
Admission: RE | Admit: 2022-11-12 | Discharge: 2022-11-12 | Disposition: A | Discharge status: Home / Self Care | Payer: Medicare Other | Attending: Allergy | Admitting: Allergy

## 2022-11-12 ENCOUNTER — Encounter (INDEPENDENT_AMBULATORY_CARE_PROVIDER_SITE_OTHER): Payer: Self-pay | Admitting: Allergy

## 2022-11-12 ENCOUNTER — Telehealth (HOSPITAL_BASED_OUTPATIENT_CLINIC_OR_DEPARTMENT_OTHER): Payer: Self-pay

## 2022-11-12 VITALS — BP 132/70 | HR 61 | Temp 97.1°F | Resp 14 | Ht 63.0 in | Wt 147.5 lb

## 2022-11-12 DIAGNOSIS — D809 Immunodeficiency with predominantly antibody defects, unspecified: Secondary | ICD-10-CM | POA: Insufficient documentation

## 2022-11-12 MED ORDER — DIPHENHYDRAMINE HCL 25 MG OR TABS OR CAPS CUSTOM
25.0000 mg | ORAL_CAPSULE | Freq: Once | ORAL | Status: DC | PRN
Start: 2022-11-12 — End: 2022-11-12

## 2022-11-12 MED ORDER — IMMUNE GLOBULIN (HUMAN) 20 GM/200ML IJ SOLN
0.5000 g/kg | Freq: Once | INTRAMUSCULAR | Status: AC
Start: 2022-11-12 — End: 2022-11-12
  Administered 2022-11-12: 25 g via INTRAVENOUS
  Filled 2022-11-12: qty 200

## 2022-11-12 MED ORDER — ACETAMINOPHEN 325 MG PO TABS
650.0000 mg | ORAL_TABLET | Freq: Once | ORAL | Status: AC
Start: 2022-11-12 — End: 2022-11-12
  Administered 2022-11-12: 650 mg via ORAL
  Filled 2022-11-12: qty 2

## 2022-11-12 MED ORDER — CETIRIZINE HCL 10 MG OR TABS
10.0000 mg | ORAL_TABLET | Freq: Once | ORAL | Status: AC
Start: 2022-11-12 — End: 2022-11-12
  Administered 2022-11-12: 10 mg via ORAL
  Filled 2022-11-12: qty 1

## 2022-11-12 MED ORDER — DEXTROSE 5 % IV SOLN
INTRAVENOUS | Status: DC
Start: 2022-11-12 — End: 2022-11-12

## 2022-11-12 NOTE — Telephone Encounter (Signed)
Hi Dr. Selena Batten,     Pt. Is having her COMPLEX ILEOSTOMY, TAKE-DOWN 11/18. Pt. Is scheduled 12/3 for next dose of IVIG. However, pt. Is worried she will not be able to make this appt. D/t recovery. I was wondering if we need to change anything about the IVIG tx or if you want her to get it inpatient? Or if she can receive in infusion whenever she is up for coming in post op. Thanks for your input on this.     Thank you,   Laverle Patter, RN

## 2022-11-12 NOTE — Interdisciplinary (Signed)
Non-Chemotherapy Infusion Nursing Note - [facility] Tiffany Chen is a 77 year old female who presents for infusion of IVIG 25 mg    Vitals:    11/12/22 0900 11/12/22 1100   BP: 131/60 132/70   BP Location: Left arm Left arm   BP Patient Position: Sitting Sitting   Pulse: 73 61   Resp: 16 14   Temp: 98 F (36.7 C) 97.1 F (36.2 C)   TempSrc: Temporal Temporal   SpO2: 99%    Weight: 66.9 kg (147 lb 7.8 oz)    Height: 5\' 3"  (1.6 m)      Pain Score: NA (pre med, non-pain or scheduled)  Body surface area is 1.72 meters squared.  Body mass index is 26.13 kg/m.    Pre-treatment nursing assessment:  No problems identified upon assessment.    Tiffany Chen tolerated treatment well.  Observation: Not ordered    Post blood return: Brisk  Post-Flush: NS and Discontinued IV    Patient Education  Learner: Patient  Barriers to learning: No Barriers  Readiness to learn: Acceptance  Method: Explanation    Treatment Education: Information/teaching given to patient including: signs and symptoms of infection, bleeding, adverse reaction(s), symptom control, and when to notify MD.    [name] Prevention Education: Instructed patient to call for assistance.    Pain Education: Patient instructed to contact nurse if pain should develop or if their current pain therapy becomes ineffective.    Response: Verbalizes understanding    Discharge Plan  Discharge instructions given to patient.  Future appointments given and reviewed with treatment plan.  Discharge Mode: Ambulatory  Discharge Time: 1130  Accompanied by: Self  Discharged To: Home     Medications   diphenhydrAMINE (BENADRYL) tablet or capsule 25 mg (has no administration in time range)   dextrose 5% infusion (has no administration in time range)   acetaminophen (TYLENOL) tablet 650 mg (650 mg Oral Given 11/12/22 0914)   cetirizine (ZYRTEC) tablet 10 mg (10 mg Oral Given 11/12/22 0915)   immune globulin (human) (IgG) 25 g in 250 mL (GAMUNEX-C) infusion (25 g  IntraVENOUS New Bag 11/12/22 0946)

## 2022-11-14 NOTE — Addendum Note (Signed)
Encounter addended by: Ladoris Gene on: 11/14/2022 4:58 PM   Actions taken: Flowsheet accepted, Charge Capture section accepted

## 2022-11-15 ENCOUNTER — Ambulatory Visit (INDEPENDENT_AMBULATORY_CARE_PROVIDER_SITE_OTHER): Payer: Medicare Other | Admitting: Audiology

## 2022-11-19 ENCOUNTER — Encounter (INDEPENDENT_AMBULATORY_CARE_PROVIDER_SITE_OTHER): Payer: Self-pay | Admitting: Hospital

## 2022-11-19 ENCOUNTER — Telehealth (INDEPENDENT_AMBULATORY_CARE_PROVIDER_SITE_OTHER): Payer: Medicare Other | Admitting: Registered"

## 2022-11-19 DIAGNOSIS — K449 Diaphragmatic hernia without obstruction or gangrene: Secondary | ICD-10-CM

## 2022-11-19 DIAGNOSIS — K219 Gastro-esophageal reflux disease without esophagitis: Secondary | ICD-10-CM

## 2022-11-19 NOTE — Progress Notes (Signed)
I had the pleasure of meeting with [name] today via telephone to review post-op nutrition guidelines.     The patient is scheduled to undergo a HHR and gastric fundoplication takedown on 11/18. Education on the post-op diet provided.    The patient is aware of how to reach me via MyChart message to address further nutritional questions if needed.    15 minutes spent with patient.         ---------------------(data below generated by Lemar Livings, RD)--------------------     Patient Verification & Telemedicine Consent & Financial Waiver:    1.   Identity: I have verified this patient's identity to be accurate.  2.   Consent: I verify consent has been secured in one of the following methods: (a) obtained written/ online attestation consent (via MyChartVideoVisit pathway), (b) the spoke-side provider has obtained verbal or written consent from patient/surrogate (if this is a "provider to provider" evaluation), or (c) in all other cases, I have personally obtained verbal consent from the patient/ surrogate (noting all elements below) to perform this voluntary telemedicine evaluation (including obtaining history, performing examination and reviewing data provided by the patient).   The patient/ surrogate has the right to refuse this evaluation.  I have explained risks (including potential loss of confidentiality), benefits, alternatives, and the potential need for subsequent face to face care. Patient/ surrogate understands that there is a risk of medical inaccuracies given that our recommendations will be made based on reported data (and we must therefore assume this information is accurate).  Knowing that there is a risk that this information is not reported accurately, and that the telemedicine video, audio, or data feed may be incomplete, the patient agrees to proceed with evaluation and holds [place] harmless knowing these risks.  3.   Healthcare Team: The patient/ surrogate has been notified that other  healthcare professionals (including students, residents and Engineer, maintenance) may be involved in this audio-video evaluation.   All laws concerning confidentiality and patient access to medical records and copies of medical records apply to telemedicine.  4.   Privacy: If this is a Restaurant manager, fast food Visit, the patient/ surrogate has received the [place] Notice of Privacy Practices via E-Checkin process.  For all other video visit techniques, I have verbally provided the patient/ surrogate with the [place] web link in [place] (https://health.[email or web address].si.aspx) or Spanish ([email or web address]://[email or web address]).  The patient/ surrogate acknowledges both being provided the NPP link, and has been offered to have the NPP mailed to the patient/ surrogate by [place] mail.  The patient/ surrogate has voiced understanding an acknowledgement of receipt of this NPP web address.  If the patient/surrogate has elected to receive the NPP via [place] mail, I verify that the NPP will be sent promptly to the patient/surrogate via [place] mail.  5.   Capacity: I have reviewed this above verification and consent paragraph with the patient/ surrogate and the patient is capacitated or has a surrogate. If the patient is not capacitated to understand the above, and no surrogate is available, since this is not an emergency evaluation, the visit will be rescheduled until such time that the patient can consent, or the surrogate is available to consent. If this is an emergency evaluation and the patient is not capacitated to understand the above, and no surrogate is available, I am proceeding with this evaluation as this is felt to be an emergency setting and no appropriate specialist is available at the bedside to perform these evaluations.  6.   Financial Waiver: If  this is a Restaurant manager, fast food Visit, the patient has been made aware of the financial waiver via E-Checkin process.  For all other video visit techniques, an E-Checkin process is  not performed.  As such, I have personally verbally informed the patient/ surrogate that this evaluation will be a billable encounter similar to an in-person clinic visit, and the patient/ surrogate has agreed to pay the fee for services rendered.  If we are billing insurance for the patient's telehealth visit, her out-of-pocket cost will be determined based on her plan and will be billed to her.  The patient/ surrogate has also been informed that if the patient does not have insurance or does not wish to use insurance, [place] [address] [employer] price for a primary care telehealth visit is $59.00 and specialist telehealth visit is $88.00.  I have further informed the patient/ surrogate that in the event the patient has additional services provided in conjunction with the specialty visit (Ex. Psychotherapy services), those services will be billed at the current rate less a 45% discount.  7.   Intra-State Location: The patient/ surrogate attests to understanding that if the patient accesses these services from a location outside of [place], that the patient does so at the patient's own risk and initiative and that the patient is ultimately responsible for compliance with any laws or regulations associated with the patient's use.  8.   Specific Use:The patient/ surrogate understands that [place] makes no representation that materials or servicesdelivered via telecommunication services, or listed on telemedicine websites, are appropriate or available for use in any other location.           Demographics:  Medical Record #: [id number]  Date: November 19, 2022  Patient Name: Tiffany Chen  DOB: 1945-05-03  Age: 77 year old  Sex: female  Location: Home address on file     Evaluator(s):  Drusilla Baumeister was evaluated by me today.    Clinic Location: [facility] INVASIVE SURGERY  [address], [address]  [place] [address]

## 2022-11-20 ENCOUNTER — Ambulatory Visit: Payer: Medicare Other

## 2022-11-20 ENCOUNTER — Emergency Department
Admission: EM | Admit: 2022-11-20 | Discharge: 2022-11-20 | Disposition: A | Discharge status: Home / Self Care | Payer: Medicare Other | Attending: Emergency Medicine | Admitting: Emergency Medicine

## 2022-11-20 ENCOUNTER — Emergency Department (HOSPITAL_BASED_OUTPATIENT_CLINIC_OR_DEPARTMENT_OTHER): Payer: Medicare Other

## 2022-11-20 ENCOUNTER — Telehealth (HOSPITAL_BASED_OUTPATIENT_CLINIC_OR_DEPARTMENT_OTHER): Payer: Self-pay | Admitting: Critical Care Medicine

## 2022-11-20 ENCOUNTER — Encounter (INDEPENDENT_AMBULATORY_CARE_PROVIDER_SITE_OTHER): Payer: Self-pay | Admitting: Hospital

## 2022-11-20 DIAGNOSIS — Z932 Ileostomy status: Secondary | ICD-10-CM | POA: Insufficient documentation

## 2022-11-20 DIAGNOSIS — R0602 Shortness of breath: Secondary | ICD-10-CM | POA: Insufficient documentation

## 2022-11-20 DIAGNOSIS — E1165 Type 2 diabetes mellitus with hyperglycemia: Secondary | ICD-10-CM | POA: Insufficient documentation

## 2022-11-20 DIAGNOSIS — J069 Acute upper respiratory infection, unspecified: Secondary | ICD-10-CM | POA: Insufficient documentation

## 2022-11-20 DIAGNOSIS — Z7984 Long term (current) use of oral hypoglycemic drugs: Secondary | ICD-10-CM | POA: Insufficient documentation

## 2022-11-20 DIAGNOSIS — R918 Other nonspecific abnormal finding of lung field: Secondary | ICD-10-CM

## 2022-11-20 DIAGNOSIS — R590 Localized enlarged lymph nodes: Secondary | ICD-10-CM

## 2022-11-20 DIAGNOSIS — R059 Cough, unspecified: Secondary | ICD-10-CM

## 2022-11-20 DIAGNOSIS — B9789 Other viral agents as the cause of diseases classified elsewhere: Secondary | ICD-10-CM | POA: Insufficient documentation

## 2022-11-20 DIAGNOSIS — J471 Bronchiectasis with (acute) exacerbation: Secondary | ICD-10-CM

## 2022-11-20 LAB — CBC WITH DIFF, BLOOD
ANC-Automated: 8.6 10*3/uL — ABNORMAL HIGH (ref 1.6–7.0)
Abs Basophils: 0 10*3/uL (ref <0.2)
Abs Eosinophils: 0 10*3/uL (ref 0.0–0.5)
Abs Lymphs: 0.5 10*3/uL — ABNORMAL LOW (ref 0.8–3.1)
Abs Monos: 0.6 10*3/uL (ref 0.2–0.8)
Basophils: 0.4 %
Eosinophils: 0.3 %
Hct: 39.1 % (ref 34.0–45.0)
Hgb: 13.2 g/dL (ref 11.2–15.7)
Imm Gran %: 0.2 % (ref <1)
Imm Gran Abs: 0 10*3/uL (ref <0.1)
Lymphocytes: 5.5 %
MCH: 31.4 pg (ref 26.0–32.0)
MCHC: 33.8 g/dL (ref 32.0–36.0)
MCV: 93.1 um3 (ref 79.0–95.0)
MPV: 10.5 fL (ref 9.4–12.4)
Monocytes: 6.1 %
Plt Count: 215 10*3/uL (ref 140–370)
RBC: 4.2 10*6/uL (ref 3.90–5.20)
RDW: 12.9 % (ref 12.0–14.0)
Segs: 87.5 %
WBC: 9.8 10*3/uL (ref 4.0–10.0)

## 2022-11-20 LAB — COMPREHENSIVE METABOLIC PANEL, BLOOD
ALT (SGPT): 29 U/L (ref 0–33)
AST (SGOT): 31 U/L (ref 0–32)
Albumin: 4.3 g/dL (ref 3.5–5.2)
Alkaline Phos: 77 U/L (ref 40–130)
Anion Gap: 12 mmol/L (ref 7–15)
BUN: 14 mg/dL (ref 8–23)
Bicarbonate: 22 mmol/L (ref 22–29)
Bilirubin, Tot: 0.76 mg/dL (ref <1.2)
Calcium: 9.5 mg/dL (ref 8.5–10.6)
Chloride: 105 mmol/L (ref 98–107)
Creatinine: 0.89 mg/dL (ref 0.51–0.95)
Glucose: 132 mg/dL — ABNORMAL HIGH (ref 70–99)
Potassium: 4.6 mmol/L (ref 3.5–5.1)
Sodium: 139 mmol/L (ref 136–145)
Total Protein: 7.6 g/dL (ref 6.0–8.0)
eGFR Based on CKD-EPI 2021 Equation: >60 mL/min/{1.73_m2}

## 2022-11-20 LAB — RAPID GROUP A STREP SCREEN: Rapid Group A Strep Screen: NEGATIVE

## 2022-11-20 LAB — INFLUENZA A/B & SARS-COV-2 PCR COMBO FOR RAPID RESPONSE LAB
Influenza A PCR, RRL: NOT DETECTED
Influenza B PCR, RRL: NOT DETECTED
SARS-CoV-2 PCR, RRL: NOT DETECTED

## 2022-11-20 LAB — LIPASE, BLOOD: Lipase: 24 U/L (ref 13–60)

## 2022-11-20 MED ORDER — SODIUM CHLORIDE 0.9 % IV SOLN
Freq: Once | INTRAVENOUS | Status: AC
Start: 2022-11-20 — End: 2022-11-20

## 2022-11-20 MED ORDER — ONDANSETRON HCL 4 MG/2ML IV SOLN
4.0000 mg | Freq: Once | INTRAMUSCULAR | Status: DC
Start: 2022-11-20 — End: 2022-11-20

## 2022-11-20 MED ORDER — ACETAMINOPHEN 325 MG PO TABS
975.0000 mg | ORAL_TABLET | Freq: Once | ORAL | Status: AC
Start: 2022-11-20 — End: 2022-11-20
  Administered 2022-11-20: 18:00:00 975 mg via ORAL
  Filled 2022-11-20: qty 3

## 2022-11-20 MED ORDER — ONDANSETRON HCL 4 MG/2ML IV SOLN
4.0000 mg | Freq: Once | INTRAMUSCULAR | Status: AC
Start: 2022-11-20 — End: 2022-11-20
  Administered 2022-11-20: 18:00:00 4 mg via INTRAVENOUS
  Filled 2022-11-20: qty 2

## 2022-11-20 NOTE — Discharge Instructions (Addendum)
You have been evaluated in the Emergency Department today. Your testing today suggests the or likely experiencing a viral upper respiratory infection. Please follow up with a GI for any concerns around your surgery scheduled. Take pain medication such as Tylenol and ibuprofen as needed.     However, you will need to follow-up with your primary care provider and GI in 1-3 days time for repeat evaluation. The follow-up appointment is a very important part of your ongoing medical evaluation and management and should not be skipped.    Continue to take all prescribed medications as instructed.    Return to the Emergency Department for any new or concerning symptoms, severe headache, numbness/tingling/weakness in your upper or lower extremities, difficulty walking, worsening chest pain, difficulty breathing, high fever, severe abdominal pain, uncontrollable bleeding or vomiting.    Talk to your primary care doctor about the spinal lesion found on your CT results. You may need an MRI.

## 2022-11-20 NOTE — ED Provider Notes (Signed)
ED Provider Note   electronic medical record reviewed for pertinent medical history.     Tiffany Chen DOB: Oct 28, 1945 PMD: Monia Sabal     Chief Complaint   Patient presents with    Flu Like Symptoms     Pt states that she has been having flu like symptoms since last night: coughing, sneezing, nausea, chills. States she is scheduled for surgery on Monday and isn't supposed to be taking any meds prior to surg, and will have to have surg rescheduled if she is unwell. 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  Presenting to the ED with 1 day history of cough, nausea, vomiting.  Patient believes she is having food night symptoms since yesterday evening, concern as she is scheduled to have a surgery on Monday for ileostomy and hernia.  Endorsing post-tussive emesis, projectile vomiting. Patient states that these symptoms are similar to when she had a MAC infection earlier this year.  Also endorsing sore throat. Denies abdominal pain, noisy breathing, difficulty breathing, difficulty tolerating oral secretions.      Pertinent Medical History:    PMHx: As above    Past Surgical  History:   Procedure Laterality Date    BRONCHOSCOPY  12/2020    CESAREAN SECTION, LOW TRANSVERSE      COLOSTOMY      CRANIOTOMY      LUNG LOBECTOMY         Family History   Problem Relation Name Age of Onset    Cancer Mother Denton Lank ngs and throat    Heart Disease Mother Joya Martyr         Congrestive heart failure due to cancer    Hypertension Mother Joya Martyr     Psychiatry Mother Joya Martyr         Bi polar, manic depressive    Cancer Father Jamal Maes         Prostate    Diabetes Father Jamal Maes     Stroke Father Jamal Maes     Stroke M Grandmother Winifred Larita Fife     Cancer Brother Vallarie Mare         Skin (many locations)    Diabetes Brother Vallarie Mare        Current Outpatient Medications   Medication Instructions    atorvastatin (LIPITOR) 20 mg, Oral, DAILY    biotin 1,000 mcg, Oral, DAILY    blood glucose meter PHARMACY: Please dispense insurance approved bluetooth enable meter.    Cyanocobalamin (VITAMIN B 12 PO) No dose, route, or frequency recorded.    ferrous sulfate 325 mg, Oral, DAILY    fluoxetine (PROZAC) 40 mg, Oral, DAILY    glucose blood test strip 1 strip, Other, 2 TIMES DAILY BEFORE MEALS    glucose blood test strip 1 strip, Other, DAILY  BEFORE BREAKFAST    lancets 1 each, Other, 2 TIMES DAILY BEFORE MEALS    lancets 1 each, Other, DAILY BEFORE BREAKFAST    metFORMIN (GLUCOPHAGE) 500 mg tablet Take 1 tablet (500 mg) by mouth every morning AND 2 tablets (1,000 mg) every evening.    Multiple Vitamin (MULTIVITAMIN) TABS tablet 1 tablet, Oral, DAILY    omeprazole (PRILOSEC) 20 mg, Oral, DAILY BEFORE BREAKFAST    ramipril (ALTACE) 2.5 mg, Oral, DAILY    sodium chloride 7 % NEBU USE 1 VIAL VIA NEBULIZER  EVERY 12 HOURS    umeclidinium-vilanterol (ANORO ELLIPTA) 62.5-25 MCG/ACT inhaler 1 puff, Inhalation, DAILY    VENTOLIN HFA 108 (90 Base) MCG/ACT inhaler USE 2 INHALATIONS BY MOUTH  EVERY 6 HOURS AS NEEDED FOR WHEEZING    vitamin D3 125 MCG (5000 UT) capsule 1 capsule, Oral, EVERY 24  HOURS NON ROUTINE    vitamin E 400 Units, Oral, DAILY       Physical Exam  BP 135/64   Pulse 92   Temp 98.3 F (36.8 C)   Resp 16   Ht 5' 4.17" (1.63 m)   Wt 70 kg (154 lb 5.2 oz)   SpO2 99%   BMI 26.35 kg/m   Physical Exam  Constitutional:       General: She is not in acute distress.  HENT:      Head: Normocephalic.      Right Ear: External ear normal.      Left Ear: External ear normal.      Nose: Congestion present.      Mouth/Throat:      Mouth: Mucous membranes are moist.      Pharynx: No oropharyngeal exudate or posterior oropharyngeal erythema.   Eyes:      Extraocular Movements: Extraocular movements intact.      Conjunctiva/sclera: Conjunctivae normal.   Cardiovascular:      Rate and Rhythm: Normal rate and regular rhythm.   Pulmonary:      Effort: Pulmonary effort is normal. No respiratory distress.      Breath sounds: Normal breath sounds and air entry.   Abdominal:      General: Abdomen is flat. The ostomy site is clean.      Palpations: Abdomen is soft.      Tenderness: There is no abdominal tenderness. There is no guarding or rebound.      Comments: Ileostomy present   Musculoskeletal:         General: Normal range of motion.      Cervical back: Normal range of motion.   Skin:     General: Skin is warm and dry.   Neurological:      General: No focal deficit present.      Mental Status: She is alert and oriented to person, place, and time. Mental status is at baseline.   Psychiatric:         Mood and Affect: Mood normal.         Behavior: Behavior normal.           Orders/Medications    Orders Placed This Encounter   Procedures    X-Ray Chest Frontal And Lateral    CT Chest W/O Contrast    CMP    CBC w/ Diff Lavender    Lipase, Blood Green Plasma Separator Tube       Medications   sodium chloride 0.9 % 1,000 mL IV bolus ( IntraVENOUS Completed 11/20/22 1939)   acetaminophen (TYLENOL) tablet 975  mg (975 mg Oral Given 11/20/22 1757)   ondansetron (ZOFRAN) injection 4 mg (4 mg IntraVENOUS Given  11/20/22 1757)         Medical Decision Making/Assessment/Plan    This is a(n) 77 year old female who has a past medical history of Acquired tracheobronchomegaly with bronchiectasis (CMS-HCC) (05/22/2020), Age-related nuclear cataract of left eye (10/15/2021), Cavitary lesion of lung (07/24/2020), Combined forms of age-related cataract of both eyes (10/15/2021), Depression, DM (diabetes mellitus) (CMS-HCC), Gastroesophageal reflux disease, unspecified whether esophagitis present (10/12/2020), 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 and presents with 1 day history of flu-like symptoms, cough, post-tussive emesis.  Patient concerned with upcoming surgery on Monday. Notes sxs feels similar to prior MAC infection treated earlier this year.  Patient currently afebrile, satting well on room air, normotensive.  Oropharynx clear, no erythema.  Lung sounds CTAB.  Abdomen soft, nontender.  Broad differential considering the patient's prior medical history of lobe resection, MAC.  Differential includes pneumonia, pneumothorax, recurrence of MAC, viral infection.  Given nausea, vomiting also considering pancreatitis, gastritis, though less likely given patient is endorsing post-tussive emesis.  Will evaluate with basic labs, lipase, COVID flu, strep, rpna. Given prior history of cavitary MAC, will consider evaluation with CT chest to monitor for any recurrence. Treating symptomatically with Tylenol, fluids, Zofran. See ED course below for updates and disposition.        ED Course/Updates/Disposition  ED Course as of 11/21/22 1027   [facility] Documentation   Wed Nov 20, 2022   1657 X-Ray Chest Frontal And Lateral  FINDINGS:  Lines/Tubes/Devices: None  Lungs: Elevated right hemidiaphragm. No new focal consolidation.  Pleura: No pneumothorax or effusion.  Mediastinum: The cardiomediastinal silhouette is unchanged.  Bones and soft tissues: Unchanged              Signed by: Bobette Mo 11/20/2022 16:34:41  IMPRESSION:  IMPRESSION:  No acute cardiopulmonary abnormality.     1808 CT Chest W/O Contrast  IMPRESSION:  Stable chest including mediastinal adenopathy and clustered pulmonary nodules throughout both lungs compatible with history of nontuberculous mycobacterial disease. No new acute pulmonary process. Continued follow-up is advised.     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 MRI could be performed as clinically indicated.     1854 CBC w/ Diff Lavender(!)  No leukocytosis, anemia, or thrombocytopenia noted   1854 CMP(!)  Mildly elevated glucose, otherwise not concerning for electrolyte abnormalities, renal, or hepatobiliary dysfunction     1855 Group A Streptococcus Rapid Antigen, Throat  Rapid strep neg   1855 Lipase, Blood Green Plasma Separator Tube  Lipase wnl   1930 Overall reassuring labs and imaging, not suggestive of MAC recurrence or pneumonia.  Patient clinically improved after fluids, tolerating p.o..  Discussed findings with patient, advised hydration and rest, likely viral URI.  Advised patient to follow up with surgeon for surgery on Monday, noted this is not a medical clearance for surgery.  Patient agreeable to plan, would like to go home.  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 Discharged with strict return to ED precautions.  ED Clinical Impression as of 11/21/22 1027   Viral URI            CMS-HCC/Risk Adjustment Factor Diagnoses       Needs Recertification        Category  and Diagnosis From    CMS-HCC 111:  Emphysema, unspecified (CMS-HCC) Joesphine Bare, MD on 03/20/2021                            Minna Merritts, MD  Resident  11/21/22 1027       Larena Sox, MD  11/28/22 1023

## 2022-11-20 NOTE — ED Notes (Signed)
Assumed care of pt who came to ED today c/o non-productive cough, fevers and several episodes of emesis with bad coughing fits.     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

## 2022-11-20 NOTE — Telephone Encounter (Signed)
Sister [name] would like to inform Dr. Fransico Michael patient was taken ER as of now not yet admitted. They are currently completing an x-ray. Sister and patient concern for scheduled surgery on Monday wether or not patient should still proceed. Sister would like to know if Dr. Fransico Michael has any further advise.    Ph.Mary Larita Fife [phone]

## 2022-11-20 NOTE — ED Notes (Signed)
Pt given written discharge instructions c verbal explanation to f/u c pcp / gi or return c worsening sx. Pt verbalized understanding of instructions. IV removed, catheter intact. Ambulating c steady gait, departed ED c all belongings

## 2022-11-20 NOTE — Telephone Encounter (Signed)
Symptom Call        Who is reporting the symptoms? Tiffany Chen - pt's sister    What are the symptoms: Tiffany Chen is calling with pt next to her. Pt states symptoms started on 11/11, she states it started as sneezing and slight runny nose which she thought it was just [facility]. Pt states that everything worsened last night, she has been coughing quite a bit, she has been throwing up all night and has a fever of 102.3. Sister states they did a COVID test but it came back negative. Sister states that pt sounds like she has a lot of phlegm in her lungs, has clear mucus with her runny nose, a sore throat. Tiffany Chen and pt would like to know:  - Should pt be taken to ER/[place]?  - Is there any medication that can alleviate pt's symptoms?  - Is there a medication that can safe bring pt's fever down?  - Should pt move forwards with surgeries scheduled on Monday?    When did the symptoms start: 11/11    Pain level 0-10:  3-4    Where is the pain located:  Chest and stomach when she coughs    Is there any bleeding: No    Last office visit: 10/16/2022    Was a follow up appt scheduled? If YES for when?   12/13/2022  w/ NP  04/16/2023 w/ MD    Best way to contact patient: Ph # [phone]    Is it OK to leave a voicemail: Yes    Was patient informed of turnaround time: Yes    Was patient informed of ED Precautions: Yes

## 2022-11-21 ENCOUNTER — Other Ambulatory Visit: Payer: Self-pay

## 2022-11-21 ENCOUNTER — Telehealth (INDEPENDENT_AMBULATORY_CARE_PROVIDER_SITE_OTHER): Payer: Self-pay | Admitting: General Surgery

## 2022-11-21 LAB — UPPER RESPIRATORY PATHOGEN NUCLEIC ACID DETECTION TEST
Adenovirus PCR, Nasopharyngeal: NOT DETECTED
Bordetella Parapertussis PCR, Nasopharyngeal: NOT DETECTED
Bordetella Pertussis PCR, Nasopharyngeal: NOT DETECTED
Chlamydia pneumoniae PCR, Nasopharyngeal: NOT DETECTED
Coronavirus 229E PCR, Nasopharyngeal: NOT DETECTED
Coronavirus HKU1 PCR, Nasopharyngeal: NOT DETECTED
Coronavirus NL63 PCR, Nasopharyngeal: NOT DETECTED
Coronavirus OC43 PCR, Nasopharyngeal: NOT DETECTED
Influenza A PCR, Nasopharyngeal: NOT DETECTED
Influenza B PCR, Nasopharyngeal: NOT DETECTED
Metapneumovirus PCR, Nasopharyngeal: NOT DETECTED
Mycoplasma pneumoniae PCR, Nasopharyngeal: NOT DETECTED
Parainfluenza 1 PCR, Nasopharyngeal: NOT DETECTED
Parainfluenza 2 PCR, Nasopharyngeal: NOT DETECTED
Parainfluenza 3 PCR, Nasopharyngeal: NOT DETECTED
Parainfluenza 4 PCR, Nasopharyngeal: NOT DETECTED
Respiratory Syncytial Virus (RSV) PCR, Nasopharyngeal: NOT DETECTED
Rhinovirus/Enterovirus PCR, Nasopharyngeal: DETECTED — AB
SARS-CoV-2 (COVID-19 PCR, Nasopharyngeal: NOT DETECTED

## 2022-11-21 MED ORDER — AMOXICILLIN-POT CLAVULANATE 875-125 MG OR TABS
1.0000 | ORAL_TABLET | Freq: Two times a day (BID) | ORAL | 0 refills | Status: AC
Start: 2022-11-21 — End: 2022-12-01

## 2022-11-21 NOTE — Telephone Encounter (Signed)
Spoke to  pt. Pt states that she went to ED on 11/13 due to sx of fever and not feeling well. Pt was seen and  treated in ED on 11/13. Pt states that she doing better today. No fevers today. Feels better "energy wise." Has a cough when she talks a lot. Pt states that she feels that she is improving but wanted to let us know that she is recovering from a cold. Advised pt that  I will send message to Dr. Orlin Hilding and also follow up  with pt tomorrow to see how she is doing. Discussed with pt that  if she is actively coughing surgery should be postponed after cough is resolved. Pt understands.

## 2022-11-21 NOTE — Telephone Encounter (Signed)
Caller: Pt  Relationship to patient: self  Phone # [phone]   Provider: Orlin Hilding  Notes:     Pt calling to notify Dr.Broderick she has a cold and dry cough. Pt concern since she will be having procedure on Monday the 18th. Pt asking for a call back from RN, please assist    Caller has been advised of 24-48 hr turnaround time.

## 2022-11-21 NOTE — Telephone Encounter (Signed)
Patient had fever and chest congestion with phlegm presented to the ED yesterday.      CXR and CT scan of the lungs showed no new acute lung infectiltrates.     RPNA was positive for rhinovirus.     Impression: 77 yr old woth bronchiectasis, cavitary MAC lung infection S/P treatment with multi antibiotic regimen  which included IV amikacin started 08/14/2020, completed 06/08/2022, also S/P right upper lobectomy 03/19/2021.     Now with a viral upper respiratory infection    See RN Taylor's note.     Plan- RN to call back  - Dr. Fransico Michael reviewed the ER notes, CT scan and CXR. Reassuring there was no evidence of a lung infection.   - viral infection (upper respiratory, not including the lungs) was confirmed to be rhinovirus.   - Agree with no antibiotics at this time. However, if the cough and chest congestion do not resolve in the next 5 days that means she may have gotten a superimposed bacterial infection. Will prescribe Augmentin to [facility] on [address]. If the cough/chest congestion do not resolve in 30-5 days, then start the Augmentin.   - As for the surgery, yes inform the surgical team. It really depends. If everything resolves I the next 2-3 days then everything should be fine. However, this may be cutting it too close

## 2022-11-21 NOTE — Telephone Encounter (Signed)
MACRN: pt went to the ED -rec fluids labs done-wnl.DX  a Cold. Feeling 80% better today    Symptoms started two days prior running nose and heavy coughing but no sputum production. Did vomiting with the cough-possible lead to dehydration    Symptoms:  -cough-today better, with talking on phone has cough, dry cough on phone  -Sputum-none  -Fever-no fever  -+rattling in chest-improved  -dehydrated-rec IV fluids in ED-now able to take fluids  -+sob with activity, and with talking    PLAN:  -rest  -fluids  -if can give sputum (usually has no sputums)  -has surgery on Monday-will call DR Tiffany Chen regarding having surgery on Monday

## 2022-11-21 NOTE — Telephone Encounter (Signed)
MACRN: pt called with instructions. She is able to return understanding.

## 2022-11-21 NOTE — ED Follow-up Note (Signed)
ED Pharmacist Culture Follow Up Note  Result Type: Respiratory culture w/ gram stain     Patient was not discharged with antibiotics at time of ED discharge. There are no current FDA approved treatments for rhinovirus and enterovirus. Spoke to patient and notified them of current results. Recommend general supportive care. Provided strict return precautions if symptoms do not improve or if they worsen. Patient acknowledged plan in place and was appreciative of the follow up.     Based on the new results below, the patient's current treatment plan: does not require a change in therapy    Response: No changes to recommend at this time    Spoke to patient      The following culture results have been reviewed:    Microbiology Results (last 7 days)       Procedure Component Value - Date/Time    Group A Streptococcus Rapid Antigen, Throat [[id number]] Collected: 11/20/22 1725    Lab Status: Final result Specimen: Throat Updated: 11/20/22 1823     Rapid Group A Strep Screen Negative     Comment: Presence of Group A Streptococcus cannot   be ruled out. Culture for Group A   Streptococcus will be added by reflex.  This test uses an FDA-approved rapid  immunochromatographic lateral flow assay   for the qualitative detection of   carbohydrate antigens specific to Group A  Streptococcus (Streptococcus pyogenes).   The results of this test should not be   used as the sole basis for making clinical  treatment decisions, and should be used in  conjunction with other clinical findings.         Influenza A/B & SARS-CoV-2 PCR Combo for Rapid Response Lab Nasal-Pharyngeal [[id number]] Collected: 11/20/22 1636    Lab Status: Final result Specimen: Nasal-Pharyngeal Updated: 11/20/22 1714     SARS-CoV-2 PCR, RRL Not Detected     Comment: SARS-CoV-2 RNA not detected. A negative test   does NOT completely rule out infection or   preclude the possibility of recent exposure to   SARS-CoV-2 (COVID-19).   This result has been reported to the [facility] and   Epidemiology Unit as required by the [employer], Title 17, Section 2505.  -----------------------------------------------  This test uses an FDA-approved reverse   transcriptase PCR assay for the qualitative   detection of RNA from the SARS-CoV-2 (COVID-19)   coronavirus. The performance characteristics   of this assay were determined by [facility] Clinical Laboratories. This test is for   clinical purposes and should not be regarded as   investigational or for research use. [facility] Clinical Laboratories are   certified under the Clinical Laboratory   Improvement Amendments (CLIA) as qualified to   performed high complexity laboratory testing.          Influenza A PCR, RRL Not Detected     Comment: This result has been reported to the [facility] and Epidemiology Unit as required  by the [employer], Title 17, Section 2505.          Influenza B PCR, RRL Not Detected     Comment: This result has been reported to the [facility] and Epidemiology Unit as required  by the [employer], Title 17, Section 2505.  ------------------------------------------  This test uses a nucleic acid  amplification and reverse transcriptase   PCR assay for the qualitative detection of  nucleic acids from Influenza A,   Influenza B, and SARS-CoV-2 (COVID-19)   viruses. This assay has received   Emergency Use Authorization from the [place]   Food and Drug Administration (FDA). This   test was developed and its performance   characteristics determined by [facility] Clinical Laboratories. This  test is for clinical purposes and should   not be regarded as investigational or for  research use. [facility]   Clinical Laboratories are certified under  the Clinical Laboratory Improvement   Amendments (CLIA) as qualified to perform  high  complexity laboratory testing.         Narrative:      Collect using a flocked swab in the designated M4RT viral  transport medium. Other swab types or VTM tubes will be  rejected.    Upper Respiratory Pathogen Nucleic Acid Detection Test [[id number]]  (Abnormal) Collected: 11/20/22 1636    Lab Status: Final result Updated: 11/21/22 0135     Adenovirus PCR, Nasopharyngeal Not Detected     Coronavirus 229E PCR, Nasopharyngeal Not Detected     Coronavirus HKU1 PCR, Nasopharyngeal Not Detected     Coronavirus NL63 PCR, Nasopharyngeal Not Detected     Coronavirus OC43 PCR, Nasopharyngeal Not Detected     SARS-CoV-2 (COVID-19 PCR, Nasopharyngeal Not Detected     Comment: This result has been reported to the [facility] and Epidemiology Unit as required  by the [employer], Title 17, Section 2505.          Metapneumovirus PCR, Nasopharyngeal Not Detected     Rhinovirus/Enterovirus PCR, Nasopharyngeal DETECTED     Influenza A PCR, Nasopharyngeal Not Detected     Comment: This result has been reported to the [facility] and Epidemiology Unit as required  by the [employer], Title 17, Section 2505.          Influenza B PCR, Nasopharyngeal Not Detected     Comment: This result has been reported to the [facility] and Epidemiology Unit as required  by the [employer], Title 17, Section 2505.          Parainfluenza 1 PCR, Nasopharyngeal Not Detected     Parainfluenza 2 PCR, Nasopharyngeal Not Detected     Parainfluenza 3 PCR, Nasopharyngeal Not Detected     Parainfluenza 4 PCR, Nasopharyngeal Not Detected     Respiratory Syncytial Virus (RSV) PCR, Nasopharyngeal Not Detected     Comment: This result has been reported to the [facility] and Epidemiology Unit as required  by the [employer], Title 17, Section 2505.          Bordetella  Parapertussis PCR, Nasopharyngeal Not Detected     Bordetella Pertussis PCR, Nasopharyngeal Not Detected     Chlamydia pneumoniae PCR, Nasopharyngeal Not Detected     Mycoplasma pneumoniae PCR, Nasopharyngeal Not Detected     Comment: This test uses an FDA-approved multiplex   real-time PCR assay for the simultaneous   qualitative detection of nucleic acids from   bacterial and viral pathogens associated with   upper respiratory tract infections. The results  of this test should not be used as the sole   basis for making clinical treatment decisions,   and should be  used in conjunction with other   clinical findings. Positive results do not rule   out co-infection with other pathogens not   included in the test. Negative results do not   preclude infection with any pathogen. This test   is not intended to be used as a test of cure.         Narrative:      Collect using a flocked swab in the designated M4RT viral  transport medium. Other swab types or VTM tubes will be  rejected.            Michaelle Birks, PharmD

## 2022-11-21 NOTE — Telephone Encounter (Signed)
Emergency department follow up outreach call    [facility] outreach conducted. Spoke with [name] or representative regarding follow up to a recent emergency department visit.    Patient Responses to Assessment Questions:  How are you feeling since you left the emergency room?  Better   Are you having any new or worsening symptoms?  Pt declines   Add comment if new or worsening symptoms:    Did you get instructions from the emergency room that tell you how to care for yourself, what symptoms to watch for, and a list of new medicines?  Yes   Do you have any questions about the emergency room discharge instructions?  No     Are you taking any new medicines prescribed by the doctor?  no  Do you have a list of your current medicine information?  Yes     What will you do if you have new or worsening symptoms?  Contact PCP and/or ED dept   Have you seen your regular doctor since you were in the emergency room? If not, is an appointment scheduled?  Pt declined to schedule at this time. Pt reports will be reaching out to surgeons in regards to upcoming sx.   Are you having problems with your care or treatments?  No     Patient advised of ED return precautions if having CP, SOB, fevers or worsening pain.    Did the patient agree to the ED to Home program?  No      Does the patient qualify for [facility] after graduation from ED to Home program ?  No. Back to PCP      Was patient offered the [place] at Home Program?  Not eligible for [place] [facility] program    Confirmed Address of Patient:  [address]  [place] [address]     Parking Considerations:      Provided patient with [place] at [facility] direct phone number:  [employer] at [phone], Select option 1 for the At [facility]. Office hours: 8:00 am to 4:30 pm Monday through Friday. Closed on weekends and Holidays.      Future appointments:  Future Appointments   Date Time Provider Department Center   12/02/2022   9:00 AM [facility] INFUSION, 9TH FLOOR [facility] Onc Chem [place]   12/10/2022  1:00 PM Broderick, Lewanda Rife, MD MON MIS MON   12/10/2022  2:00 PM Andreas Newport, AuD [facility] Hns [facility]   12/13/2022  1:45 PM Cameron Proud, NP MOS Pulm Gen MOS   12/17/2022 11:00 AM [facility] INFUSION, 9TH FLOOR [facility] Onc Chem [place]   12/24/2022  2:00 PM Dorian Furnace, MD MON Srg Gen MON   12/26/2022 10:30 AM Fredric Mare, DO [facility] Int Med [facility]   01/29/2023  2:30 PM Selena Batten Sharman Crate, MD [facility] Allergy [facility]   02/03/2023  2:30 PM Grant Ruts, MD [facility] Ophth [facility]   04/16/2023 11:30 AM [name], MD MOS Pulm Gen MOS   06/09/2023  2:00 PM Andreas Newport, AuD [facility] Hns [facility]

## 2022-11-22 ENCOUNTER — Telehealth (HOSPITAL_COMMUNITY): Payer: Self-pay

## 2022-11-22 LAB — GROUP A STREP CULTURE: Strep Group A Culture Result: NO GROWTH

## 2022-11-22 NOTE — Interdisciplinary (Signed)
Periop Nurse Navigator called and spoke with patient (two patient identifiers confirmed). Reviewed pre op call checklist, medication list, check in time [phone] and check in location [facility], 1st floor admissions). Discussed with patient that check in time is subject to change and to keep phone close by. Advised patient to bring insurance card, ID, and form of payment. NPO instructions provided. Patient verbalizes understanding.

## 2022-11-22 NOTE — Telephone Encounter (Signed)
Routing to Dr. Orlin Hilding & Dannielle Karvonen    DOS: 11/25/2022  Laparoscopic hiatal hernia repair, gastric fundoplication, intraoperative upper endoscopy, possible mesh placement     Pt states she had cold symptoms starting on Monday 11/11: sneezing, runny nose, cough with fever. pt went to the ER on Wednesday. pt states she updated Dr. Orlin Hilding.   pt states today symptoms have improved: occasional dry cough, denies fever in the last 24 hours. COVID (-)    RN reporting current symptoms. Pt states she will receive a call from Dr. Dalbert Garnet office to discuss symptoms and determine if surgery will be scheduled for 11/25/2022.

## 2022-11-22 NOTE — Telephone Encounter (Signed)
Spoke to pt. States that she is "much improved today." Cough almost resolved. Pt believes that cough will be completely gone over the weekend. Pt wants to keep surgery for Monday.   Advised pt that I will let Dr.Broderick know. Pt does sound much better over the phone today than yesterday (less congested and not coughing).

## 2022-11-22 NOTE — Telephone Encounter (Signed)
Called patient regarding procedure on Monday 11/18 and to go over check in time. Pt states she received a call from someone in clinic today and was advised she was okay to stay on schedule due to improvement. I asked patient if she was provided check in time, due to message below. Patient confirmed all info and appreciative of call. Ok per MD to proceed, per separate TE.

## 2022-11-24 ENCOUNTER — Encounter (INDEPENDENT_AMBULATORY_CARE_PROVIDER_SITE_OTHER): Payer: Self-pay | Admitting: General Surgery

## 2022-11-24 ENCOUNTER — Encounter (HOSPITAL_BASED_OUTPATIENT_CLINIC_OR_DEPARTMENT_OTHER): Payer: Self-pay | Admitting: Surgery

## 2022-11-25 ENCOUNTER — Ambulatory Visit (INDEPENDENT_AMBULATORY_CARE_PROVIDER_SITE_OTHER): Payer: Medicare Other | Admitting: Ophthalmology

## 2022-11-25 DIAGNOSIS — K219 Gastro-esophageal reflux disease without esophagitis: Secondary | ICD-10-CM | POA: Insufficient documentation

## 2022-11-25 SURGERY — FUNDOPLICATION, LAPAROSCOPIC
Site: Esophagus

## 2022-12-02 ENCOUNTER — Ambulatory Visit (HOSPITAL_BASED_OUTPATIENT_CLINIC_OR_DEPARTMENT_OTHER): Admit: 2022-12-02 | Payer: Medicare Other

## 2022-12-02 NOTE — Telephone Encounter (Signed)
Pt is requesting to reschedule procedure Please assist. Ph:[phone]

## 2022-12-10 ENCOUNTER — Ambulatory Visit (INDEPENDENT_AMBULATORY_CARE_PROVIDER_SITE_OTHER): Payer: Medicare Other | Admitting: Audiology

## 2022-12-10 ENCOUNTER — Ambulatory Visit: Payer: Medicare Other | Admitting: General Surgery

## 2022-12-10 ENCOUNTER — Ambulatory Visit
Admission: RE | Admit: 2022-12-10 | Discharge: 2022-12-10 | Disposition: A | Discharge status: Home / Self Care | Payer: Medicare Other | Attending: Allergy | Admitting: Allergy

## 2022-12-10 VITALS — BP 121/61 | HR 72 | Temp 98.0°F | Resp 16 | Ht 63.0 in | Wt 149.3 lb

## 2022-12-10 DIAGNOSIS — D809 Immunodeficiency with predominantly antibody defects, unspecified: Secondary | ICD-10-CM | POA: Insufficient documentation

## 2022-12-10 MED ORDER — ACETAMINOPHEN 325 MG PO TABS
650.0000 mg | ORAL_TABLET | Freq: Once | ORAL | Status: AC
Start: 2022-12-10 — End: 2022-12-10
  Administered 2022-12-10: 11:00:00 650 mg via ORAL
  Filled 2022-12-10: qty 2

## 2022-12-10 MED ORDER — CETIRIZINE HCL 10 MG OR TABS
10.0000 mg | ORAL_TABLET | Freq: Once | ORAL | Status: AC
Start: 2022-12-10 — End: 2022-12-10
  Administered 2022-12-10: 11:00:00 10 mg via ORAL
  Filled 2022-12-10: qty 1

## 2022-12-10 MED ORDER — DIPHENHYDRAMINE HCL 25 MG OR TABS OR CAPS CUSTOM
25.0000 mg | ORAL_CAPSULE | Freq: Once | ORAL | Status: DC | PRN
Start: 2022-12-10 — End: 2022-12-10

## 2022-12-10 MED ORDER — DEXTROSE 5 % IV SOLN
INTRAVENOUS | Status: DC
Start: 2022-12-10 — End: 2022-12-10

## 2022-12-10 MED ORDER — IMMUNE GLOBULIN (HUMAN) 20 GM/200ML IJ SOLN
0.5000 g/kg | Freq: Once | INTRAMUSCULAR | Status: AC
Start: 2022-12-10 — End: 2022-12-10
  Administered 2022-12-10: 12:00:00 25 g via INTRAVENOUS
  Filled 2022-12-10: qty 200

## 2022-12-10 NOTE — Progress Notes (Unsigned)
PULMONARY OUTPATIENT CLINIC FOLLOW-UP NOTE    No chief complaint on file.      History of Present Illness:  Tiffany Chen is a 77 year old female with history of bronchiectasis, cavitary MAC lung infection, ruptured diverticulitis s/p colostomy in 2009, DM, ICH s/p embolization. She is here to follow up on bronchiectasis and MAC lung infection.      Patient's last visit on 10/16/2022 with Dr. Fransico Michael to follow up on bronchiectasis and MAC lung infection. I saw Tiffany Chen the last on 08/26/2022 to follow up on bronchiectasis and MAC lung infection. She states doing well from a respiratory stand point since last visit. She denies ED visits or recent hospitalizations.       Tiffany Chen has history of MAC lung infection treated multiple times with azithromycin 500mg , Rifampin 600mg  , ethambutol 1600mg  three times weekly: 2018- 01/19/2018, 05/25/2018- 10/22/2019 and 12/22/2019- 02/16/2020.     As per last note from Dr. Fransico Michael:   In late 2021, CT scan showed enlarging cavity. December 2021 a bronch was performed but no MAC growth. Her doctor restarted azithro/Rifampin/ethmabutol based on imaging.   Feb 2022 she saw Dr. Johnnette Barrios who stopped the antibiotics based on the lack of microbiologic data. Performed repeat bronch 04/28/2020 which has smear AFB 3+ and culture MAC (drug susc pending).      07/25/2019 - she was strongly recommended to start new round of MAC antibiotic therapy due to the severity of the lung cavitation and severity of her symptoms. The regimen recommended was IV antibiotic (Amikacin) home infusion for 3 months, azithromycin 250 mg daily, ethambutol 800 mg daily and rifampin 600 mg daily.       08/14/2020 - She started on MAC antibiotic therapy with tentative plan to stop IV amikacin on 11/24/2020 and replaced with arikayce. Her IV was removed on 11/24/2020 and also discussed the possibility of right upper lobe lobectomy.    11/23/2020 - she started on Arikayce nebs every day. She reports  mild sore throat with Arykace but was able to tolerate with hot tea.       01/17/2021 - she was added clofazimine to her regimen.  In addition to azithro/rifampin/ethambutol and Arikayce. She also was referred to Dr. Chestine Spore for consideration for right upper and middle lobectomy. She was scheduled for RUL lobectomy on 03/20/2021.      02/21/2021 _ She was added clofazimine.      03/20/2021 -  She have had Right Robotic upper lobectomy with middle and lower  lobe wedge resection.      06/08/2022 - She completed MAC antibiotic therapy. Her regiment consisted on: azithromycin, rifampin, ethambutol, clofazimine (which was started 02/21/2021) and inhaled Arikayce.     CT chest from 05/20/2022 with worsening waxing and waning moderate mediastinal or hilar lymphadenopathy. She was evaluated by oncology and IP. She will have EBUS with LN TBNA, LN cryobiopsy, BAL .     Today, Tiffany Chen states feeling well at her baseline. She reports symptom improvement  after MAC antibiotic therapy. She reports intermittent non productive cough. She is no able to produce phlegm for respiratory culture. She has not been doing air way clearance. She denies wheezing or dyspnea. She denies hemoptysis, fevers, chills, nausea, vomiting or night sweats. She reports persistent fatigue. She also reports weight loss. She is currently capable to walk more several miles without feeling winded.   She denies feeling winded when going uphill or on stairs.  She denies chest congestion, orthopnea, PND lower  extremity edema. She reports history of palpitations. She denies allergic rhinitis, sinus problems or other URI. She has history of GERD controlled with diet and PPI. She have had influenza vaccine on 10/2021 and competed COVID vaccines with 5 booster. .      She continues using Anoro once a day.     Review of Systems:   A full  review of systems was performed and the pertinent positives and negatives were mentioned in the HPI, all other review of systems  were negative.     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       Past Surgical History:   Procedure Laterality Date    BRONCHOSCOPY  12/23/20    CESAREAN SECTION, LOW TRANSVERSE      COLOSTOMY      CRANIOTOMY      LUNG LOBECTOMY         Family History   Problem Relation Name Age of Onset    Cancer Mother Denton Lank ngs and throat    Heart Disease Mother Joya Martyr         Congrestive heart failure due to cancer    Hypertension Mother Joya Martyr     Psychiatry Mother Joya Martyr         Bi polar, manic depressive    Cancer Father Jamal Maes         Prostate    Diabetes Father Jamal Maes     Stroke Father Jamal Maes     Stroke M Grandmother Winifred Larita Fife     Cancer Brother Vallarie Mare         Skin (many locations)    Diabetes Brother Vallarie Mare        none     Socioeconomic History    Marital status: Widowed   Tobacco Use    Smoking status: Never    Smokeless tobacco: Never   Vaping Use    Vaping status: Never Used   Substance and Sexual Activity    Alcohol use: Yes     Comment:  glass of wine every other month    Drug use: Not Currently    Sexual activity: Not Currently   Other Topics Concern    Military Service No    Blood Transfusions No    Caffeine Concern No    Occupational Exposure No    Hobby Hazards Yes     Comment: Gardening, MAC    Sleep Concern No    Stress Concern Yes     Comment: Husband death Cov58, Dec 24, 2018,  sold home, rehomed pets. [place]    Weight Concern No    Special Diet Yes    Back Care No    Exercises Regularly Yes    Bike Helmet Use No    Seat Belt Use No    Performs Self-Exams No   Social History Narrative    Lives with brother Fayrene Fearing at home. Sister also lives in [facility]     Social Determinants of Health     Food Insecurity: No Food Insecurity (07/09/2022)    Hunger Vital Sign     Worried About Running Out of Food in the Last Year: Never true     Ran Out of Food in the Last Year: Never true   Transportation Needs: No Transportation Needs (07/09/2022)    PRAPARE - Therapist, art (Medical): No     Lack of Transportation (Non-Medical): No   Intimate Partner Violence: Low Risk  (03/25/2022)    [place] IPV     Have you ever been emotionally or physically abused by your partner or someone important to you?: No     Within the last year have you been hit, slapped, kicked or otherwise physically hurt by someone?: No     Within the last year has anyone forced you to have sexual activities?: No     Are you afraid of your spouse or partner you listed above?: No   Housing Stability: Unknown (07/09/2022)    Housing Stability Vital Sign     Unable to Pay for Housing in the Last Year: No     Unstable Housing in the Last Year: No       Allergies   Allergen Reactions    Walnuts Swelling     Tongue swollen and burn       Current Outpatient Medications on File Prior to Visit   Medication Sig Dispense Refill    atorvastatin (LIPITOR) 20 MG tablet Take 1 tablet (20 mg) by mouth daily. 90 tablet 1    biotin 1000 MCG tablet Take 1 tablet (1,000 mcg) by mouth daily.      blood  glucose meter PHARMACY: Please dispense insurance approved bluetooth enable meter. 1 each 0    Cyanocobalamin (VITAMIN B 12 PO)       ferrous sulfate 325 (65 Fe) MG tablet Take 1 tablet (325 mg) by mouth daily.      fluoxetine (PROZAC) 40 MG capsule Take 1 capsule (40 mg) by mouth daily. 90 capsule 3    glucose blood test strip 1 strip by Other route every morning (before breakfast). 100 strip 2    glucose blood test strip 1 strip by Other route 2 times daily (before meals). 100 strip 11    lancets 1 each by Other route every morning (before breakfast). 100 each 2    lancets 1 each by Other route 2 times daily (before meals). 100 Lancet. 5    metFORMIN (GLUCOPHAGE) 500 mg tablet Take 1 tablet (500 mg) by mouth every morning AND 2 tablets (1,000 mg) every evening. 270 tablet 1    Multiple Vitamin (MULTIVITAMIN) TABS tablet Take 1 tablet by mouth daily.      omeprazole (PRILOSEC) 20 MG capsule Take 1 capsule (20 mg) by mouth every morning (before breakfast). 90 capsule 3    ramipril (ALTACE) 2.5 MG capsule Take 1 capsule (2.5 mg) by mouth daily. 90 capsule 3    sodium chloride 7 % NEBU USE 1 VIAL VIA NEBULIZER  EVERY 12 HOURS 720 mL 3    umeclidinium-vilanterol (ANORO ELLIPTA) 62.5-25 MCG/ACT inhaler Inhale 1 puff by mouth daily. 60 each 11    VENTOLIN HFA 108 (90 Base) MCG/ACT inhaler USE 2 INHALATIONS BY MOUTH  EVERY 6 HOURS AS NEEDED FOR WHEEZING 72 g 3    vitamin D3 125 MCG (5000 UT) capsule Take 1 capsule (5,000 Units) by mouth every 24 hours.      vitamin E 400 UNIT capsule Take 1 capsule (400 Units) by mouth daily.       Current Facility-Administered Medications on File Prior to Visit   Medication Dose Route Frequency Provider Last Rate Last Admin    [COMPLETED] acetaminophen (TYLENOL) tablet 650 mg  650 mg Oral Once Malachy Moan, MD   650 mg at 12/10/22 1112    [COMPLETED] cetirizine (ZYRTEC) tablet 10 mg  10 mg Oral Once Malachy Moan, MD   10 mg at 12/10/22 1113    dextrose 5% infusion    IntraVENOUS Continuous Malachy Moan, MD 10 mL/hr at 12/10/22 1155 New Bag at 12/10/22 1155    diphenhydrAMINE (BENADRYL) tablet or capsule 25 mg  25 mg Oral Once PRN Malachy Moan, MD        [COMPLETED] immune globulin (human) (IgG) 25 g in 250 mL [facility]) infusion  0.5 g/kg (Treatment Plan Ideal) IntraVENOUS Once Malachy Moan, MD   25 g at 12/10/22 1159       Physical Examination:  There were no vitals taken for this visit.           General: well appearing alert and oriented x 3, in nad.  Oropharynx: without any lesions  Heart:  Regular rate and rhythm, normal S1 S2, no murmurs.  Lungs: clear to auscultation, right upper lobe wheezing, rales, rhonchi, no chest deformities noted. Normal I:E ratio  Extremities:  no cyanosis, clubbing, or edema.  Skin:  No rashes or lesions.  Psych: normal affect and mood       I have reviewed the following  laboratory data and other diagnostic studies:   CBC:  Lab Results   Component Value Date    WBC 9.8 11/20/2022    HGB 13.2 11/20/2022    HCT 39.1 11/20/2022    PLT 215 11/20/2022     CHEM:  Lab Results   Component Value Date    NA 139 11/20/2022    K 4.6 11/20/2022    CL 105 11/20/2022    BICARB 22 11/20/2022    BUN 14 11/20/2022    CREAT 0.89 11/20/2022    GLU 132 (H) 11/20/2022    [place] 9.5 11/20/2022     COAG:  No results found for: "PT", "PTT", "INR"  LFTs:  Lab Results   Component Value Date    AST 31 11/20/2022    ALT 29 11/20/2022    ALK 77 11/20/2022    TP 7.6 11/20/2022    ALB 4.3 11/20/2022    TBILI 0.76 11/20/2022    DBILI <0.2 07/17/2020          Review of Radiology Studies:    Pulmonary Function Test Review:  04/28/2020 (care everywhere): noraml spito, normal volumes, mildly reduced DLCO     Radiology Review:    Chest x ray (11/20/2022):  FINDINGS:  Lines/Tubes/Devices: None  Lungs: Elevated right hemidiaphragm. No new focal consolidation.  Pleura: No pneumothorax or effusion.  Mediastinum: The cardiomediastinal silhouette is  unchanged.  Bones and soft tissues: Unchanged  IMPRESSION:  No acute cardiopulmonary abnormality.    Chest x ray (03/20/2021):  FINDINGS:  Lines and Tubes: Large-bore right-sided chest tube seen in place with some subcutaneous emphysema  Mediastinum: The cardiomediastinal silhouette is within normal limits. No lymphadenopathy is appreciated.  Lungs: The lungs are clear. Status post right upper lobe lobectomy with re-expansion of the lung tissue.  Pleura: No pneumothorax or effusion.  Bones and soft tissues: Unchanged    IMPRESSION:  Right-sided large-bore chest tube with no evidence of pneumothorax. Status post right upper lobectomy    Chest x ray (12/25/2020):  FINDINGS:  No pleural effusion or pneumothorax demonstrated.     Right upper lobe cavitary lesions.     Additional small nodularity better seen on the comparison chest CT.     Unremarkable cardiac silhouette.     Slight curvature of the descending thoracic aorta.     No acute osseous abnormality identified.    IMPRESSION:  No postprocedural complication identified.    CT chest (11/20/2022):  FINDINGS:  Lines and Tubes: None     Thyroid and thoracic inlet: Normal.  Lymph nodes: Stable mediastinal adenopathy..  Heart: Stable small pericardial effusion. Coronary artery calcification.  Vasculature: Normal.  Esophagus: Normal.     Lung: Stable postsurgical changes right lung. Unchanged clustered nodular opacities throughout both lungs.  Pleura: Normal.     Abdomen: Normal.  Bone and soft tissue: Unchanged lytic lesion right T10 pedicle with smooth sclerotic margins compared to 2022.    IMPRESSION:  Stable chest including mediastinal adenopathy and clustered pulmonary nodules throughout both lungs compatible with history of nontuberculous mycobacterial disease. No new acute pulmonary process. Continued follow-up is advised.     Unchanged expansion of the right T10 neural foramen with smooth sclerotic margins compared to 2022, likely benign, query nerve sheath  tumor. MRI could be performed as clinically indicated.    CT chest (05/20/2022):  FINDINGS:     LINES AND TUBES: None.     THYROID AND THORACIC INLET: Unremarkable.     LYMPH NODES: Waxing and waning moderate mediastinal and hilar lymphadenopathy, slightly increased.     CARDIOVASCULAR: Unchanged aortic and coronary calcification.     PERICARDIUM: Unremarkable.     ESOPHAGUS: Unremarkable.     LUNG: Postoperative changes in the right hemithorax again noted, with expected evolution. Multifocal cylindrical bronchiectasis with associated clustered tree-in-bud nodularity, not substantially changed compared to the last study. Patent central airways.     PLEURA: Unremarkable.     ABDOMEN: Unchanged upper abdomen.     BONE AND SOFT TISSUE: Mild disc degeneration. No suspicious bony lesions. Diffuse osteopenia, as before.    IMPRESSION:  Nonspecific, waxing and waning moderate mediastinal or hilar lymphadenopathy. CT follow-up in 6 months is suggested for re-evaluation. If lymphadenopathy worsens at that time, consider workup for underlying hematological malignancy.     Expected evolution of postoperative changes in the right hemithorax.     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    CT chest (06/26/2021):  FINDINGS:  Lines and Tubes: None   Thyroid and thoracic inlet: Normal.   Lymph nodes: There is a left lower cervical lymphadenopathy measuring up to 1.1 cm in short axis diameter, previously 9 mm.  There is left upper paratracheal mediastinal lymphadenopathy measuring up to 1.4 cm in short axis diameter, previously 1 cm.  There is periaortic lymphadenopathy measuring up to 1 cm in short axis diameter, previously 9 mm.  There is  subaortic lymphadenopathy measuring up to 1.4 cm in short axis diameter, previously 1.1 cm.  A right subcarinal lymph node measures 1.4 cm in short axis diameter, previously 7 mm.  Left subcarinal lymph nodes measure approximately  1.2 cm in short axis diameter, previously 1.1 cm.  Mediastinal lymph nodes are not well assessed in the absence of intravenous contrast.  Cardiovascular: Heart size is normal.  Mild coronary artery calcification is present the thoracic aorta pulmonary artery are normal in caliber.  Pericardium: There is a trace pericardial effusion, similar to the prior.  Esophagus: Normal.  Lung: Patient is status post interval right upper lobectomy.  There are scattered tree-in-bud opacities present within the lungs similar to slightly improved in comparison to the prior study.  Previously seen scattered patchy groundglass opacities appear improved.  Pleura: There is a small residual right hydropneumothorax.  Abdomen: Please see the concurrently performed CT abdomen pelvis report for findings below the diaphragm.  Bone and soft tissue: No acute abnormality.    IMPRESSION:  1. Post interval right upper lobectomy with a small residual right hydropneumothorax.      2. Scattered tree-in-bud airspace opacities throughout the lungs, similar to slightly improved.  Previously seen patchy groundglass opacities within the lungs appear improved.    3. Mediastinal lymphadenopathy.  This appears slightly increased in comparison to the prior study.     Ct chest (01/19/2021):  FINDINGS:  Lines and Tubes: None     Thyroid and thoracic inlet: Normal.     Lymph nodes: 11 mm AP window lymph node measured 7 mm on the prior study. Pre-vascular 7 mm lymph node measured 4 mm on the prior study. Subcarinal lymphadenopathy may be slightly increased.     Cardiovascular: Atherosclerotic disease.     Pericardium: Small amount of pericardial fluid is unchanged     Esophagus: Normal.     Lung: 3.4 cm cavitary mass in the posterior segment of the right lower lobe measured 3 cm on the prior study. Increasing ground-glass opacity throughout the anterior and apical segments of the right upper lobe. Increasing consolidation at the right apex. Increasing nodularity  and bronchial wall thickening in the right upper lobe. Wall thickness of a cavity in the right upper lobe on series 4, image 334 measured 9 mm. This measured 3 mm on the prior study. Increasing ground-glass opacity in the lower lobes. Increasing tree-in-bud nodularity. Previously seen nodularity persists.     Pleura: Normal.     Abdomen: Visualized superior abdomen demonstrates hepatic hypodensity which is unchanged     Bone and soft tissue: No acute abnormality.    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 Additionally, ground-glass opacity and nodularity has increased.    CT chest (11/09/2020):  FINDINGS:  Lines and Tubes: PICC ends in the left brachiocephalic vein     Thyroid and thoracic inlet: Normal.  Lymph nodes: Lymph nodes have increased in size. 8 mm AP window lymph node measured 4 mm on the prior study. 8 mm lymph node adjacent to the superior vena cava measured 6 mm on the prior study. Other lymph nodes are unchanged.  Cardiovascular: Atherosclerotic disease is relatively mild for the patient's age.  Pericardium: Small pericardial effusion  Esophagus: Normal.     Lung: Comparison to the outside study is limited due to poor spatial resolution on that study given 5 mm slice thickness versus 0.625 mm slice thickness. New thick walled  cavitary 2.6 cm right apical nodule on series 2, image 393 adjacent 12 mm thick walled cavitary nodule is new. Worsening tree-in-bud nodularity and bronchiectasis in the right upper lobe. Thick walled cavity in the posterior segment of the right upper lobe measuring 3.1 cm is unchanged in size but demonstrates increased wall thickness. Surrounding tree-in-bud nodularity is increased. Cavitary nodule in the right middle lobe is unchanged. Bronchiectasis may be increased but is again difficult to assess given differences in slice thickness. Worsening  tree-in-bud nodularity throughout the right middle and right lower lobes. Worsening nodular consolidation in the medial aspect of the right middle lobe. New areas of nodularity throughout the left lung.  Pleura: Normal.     Abdomen: 1.2 cm hypoattenuating nodule in the liver. This is unchanged     Bone and soft tissue: No acute abnormality.    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    CT chest 04/13/2020:   CONCLUSION:   1. 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   2. Cavitary lesion in the periphery of the right lung has increased in size measuring 2.7 cm, although may have a slightly less thickened and nodular wall.   3.  Diffuse bronchiectasis most pronounced in the right upper lobe.      Microbiology  BAL 04/28/2020 (from Dr. Merryl Hacker): AFB smear 3+, culture MAC (drug susceptibly testing sent to [facility]- pending)       6 MIN WALK (01/30/2021):      PFT (08/17/2021):      PFTs ( 02/16/2021):      ASSESSMENT AND PLAN:  Tiffany Chen is a 77 year old female with history of bronchiectasis, cavitary MAC lung infection, ruptured diverticulitis s/p colostomy in 2009, DM, ICH s/p embolization. She is here to follow up on bronchiectasis and MAC lung infection.     # Bronchiectasis/ Cavitary MAC lung infection: currently stable and asymptomatic. She was advised to start MAC antibiotic therapy due to the severity of her lesions and symptoms. She started on  IV antibiotic Amikacin home infusion for 3 months (08/14/2020 to 11/24/2020), azithromycin 250 mg daily, ethambutol 800 mg daily and rifampin 600 mg daily.  She was started on inhaled Arikayce on 11/23/2020.  Clofazimine was added on 1/11/20223. She have had right Robotic upper lobectomy with middle and lower  lobe wedge resection on 03/20/2021. She reports improved  condition with current MAC antibiotic therapy. She completed MAC antibiotic therapy on 06/08/2022. [name] She reports improved pulmonary condition after completing MAC antibiotics. PFT from 08/17/2021 wnl.[name] PFT from 02/16/2021 with ( FVC 2.00- 79%, FEV 1 1.60 - 85%, FEV1/FVC 80, DLCOc 80%).  I have discussed air way clearance. I have recommended her to continue with air way clearance once a day with saline nebulizer solution followed by acapella device . Patient advised to continue with Anoro once a day. No new orders for today.  ED precautions given.     # Mediastinal Lymphadenopahty: as seen on recent CT Chest from 05/20/2022 with worsening waxing and waning moderate mediastinal or hilar lymphadenopathy. She was evaluated by oncology and IP. She will have EBUS  (08/29/2022)with LN TBNA, LN cryobiopsy, BAL.    # Immunizations: up to date.     "I personally spend a total of 60 Minutes in face to face and non face to face activities related to patient's visit today,  excluding and separately reportable services/procedures."    Follow up with Dr. Fransico Michael in 2 months.     Cameron Proud NP-BC  Pulmonary Medicine

## 2022-12-10 NOTE — Interdisciplinary (Signed)
Non-Chemotherapy Infusion Nursing Note - Tiffany Chen is a 77 year old female who presents for infusion of IVIG    Medications   dextrose 5% infusion ( IntraVENOUS New Bag 12/10/22 1155)   diphenhydrAMINE (BENADRYL) tablet or capsule 25 mg (25 mg Oral Not given 12/10/22 1150)   acetaminophen (TYLENOL) tablet 650 mg (650 mg Oral Given 12/10/22 1112)   cetirizine (ZYRTEC) tablet 10 mg (10 mg Oral Given 12/10/22 1113)   immune globulin (human) (IgG) 25 g in 250 mL [facility]) infusion (25 g IntraVENOUS New Bag 12/10/22 1159)        Vitals:    12/10/22 1100 12/10/22 1357   BP: 143/76 121/61   BP Location: Left arm Left arm   BP Patient Position: Sitting Sitting   Pulse: 72 72   Resp: 16 16   Temp: 98 F (36.7 C)    TempSrc: Temporal    SpO2: 99%    Weight: 67.7 kg (149 lb 4 oz)    Height: 5\' 3"  (1.6 m)      Pain Score: 0  Body surface area is 1.73 meters squared.  Body mass index is 26.44 kg/m.    Pre-treatment nursing assessment:  No problems identified upon assessment.    Tiffany Chen tolerated treatment well.  Observation: Not ordered    Post blood return: Brisk  Post-Flush: NS    Patient Education  Learner: Patient  Barriers to learning: No Barriers  Readiness to learn: Acceptance  Method: Explanation    Treatment Education: Information/teaching given to patient including: signs and symptoms of infection, bleeding, adverse reaction(s), symptom control, and when to notify MD.    Tiffany Chen Prevention Education: Instructed patient to call for assistance.    Pain Education: Patient instructed to contact nurse if pain should develop or if their current pain therapy becomes ineffective.    Response: Verbalizes understanding    Discharge Plan  Discharge instructions given to patient.  Future appointments given and reviewed with treatment plan.  Discharge Mode: Ambulatory  Discharge Time: 1400  Accompanied by: Self  Discharged To: Home

## 2022-12-11 ENCOUNTER — Encounter (INDEPENDENT_AMBULATORY_CARE_PROVIDER_SITE_OTHER): Payer: Self-pay | Admitting: Registered Nurse

## 2022-12-11 DIAGNOSIS — A310 Pulmonary mycobacterial infection: Secondary | ICD-10-CM

## 2022-12-12 NOTE — Addendum Note (Signed)
Encounter addended by: Lennie Muckle on: 12/12/2022 3:40 AM   Actions taken: Charge Capture section accepted

## 2022-12-13 ENCOUNTER — Telehealth (HOSPITAL_BASED_OUTPATIENT_CLINIC_OR_DEPARTMENT_OTHER): Payer: Self-pay | Admitting: Nurse Practitioner

## 2022-12-13 ENCOUNTER — Ambulatory Visit (HOSPITAL_BASED_OUTPATIENT_CLINIC_OR_DEPARTMENT_OTHER): Payer: Medicare Other | Admitting: Nurse Practitioner

## 2022-12-13 NOTE — Telephone Encounter (Signed)
Caller: Patient  Relationship to patient: Self  Phone # [phone]  Provider: Dr. Geraldine Contras    Notes:   Patient has an appointment for today at 1:45 and it is for a video patient stated she never agreed to a video appointment and prefers to be seen in clinic is calling today to see if she can keep the appointment today and be seen in clinic, please assist, thank you.       Ph. [phone]

## 2022-12-17 ENCOUNTER — Ambulatory Visit: Payer: Medicare Other

## 2022-12-17 NOTE — Telephone Encounter (Signed)
Called pt today to offer Pulm f/u with NP, Ledon, pt refused as his schedule is all VV. Spoke with Pulm nurse who stated that pt can wait for next appt with Dr. Caryl Comes.

## 2022-12-24 ENCOUNTER — Ambulatory Visit: Payer: Medicare Other | Admitting: Surgery

## 2022-12-25 DIAGNOSIS — M8588 Other specified disorders of bone density and structure, other site: Secondary | ICD-10-CM | POA: Insufficient documentation

## 2022-12-25 DIAGNOSIS — I152 Hypertension secondary to endocrine disorders: Secondary | ICD-10-CM | POA: Insufficient documentation

## 2022-12-25 DIAGNOSIS — F339 Major depressive disorder, recurrent, unspecified: Secondary | ICD-10-CM | POA: Insufficient documentation

## 2022-12-25 NOTE — Progress Notes (Signed)
 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, T2DM, history of perforated diverticulitis s/p partial colectomy and colostomy in 2009 followed by 03/2022 hernia surgery with colostomy takedown, colorectal anastomosis, and creation of an ileostomy plan for takedown , 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   10 years ago today her son Tiffany Chen passed away, anniversary of husbands Tiffany Chen death from covid  PHQ9 16 today, no SI/HI. She notes her nature is to be happy, but admits she is feeing more down because of anniversaries. She noese her depression is far better than it was 4 years. Not as highly motivated, doesn't want to people, she is still completing ADLs and getting what she needs to get done, taking medications.  Lives in 2 story home, sister lives on one floor.   She was a Investment banker, corporate for 40 years    Second reading BP 135/85  She does not check Bps at home as her BP in office run 110/60-70s.   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)    Mycobacterium avium complex (CMS-HCC)    s/p Robotic right upper lobectomy with en bloc lower and middle lobe wedge resection, middle lobe wedge resection    Status post Hartmann procedure (CMS-HCC)    Ventral hernia without obstruction or gangrene    Colostomy status (CMS-HCC)    Status post cataract extraction and insertion of intraocular lens of right eye    Status post cataract extraction and insertion of intraocular lens of left eye    Type 2 diabetes mellitus without complication, without long-term current use of insulin  (CMS-HCC)    Pseudophakia of both eyes    Posterior vitreous detachment    S/P repair of ventral hernia    Ileostomy in place (CMS-HCC)    Unspecified mood (affective) disorder (CMS-HCC)    Hyperlipidemia associated with type 2 diabetes mellitus (CMS-HCC)    Nonruptured cerebral aneurysm    Recurrent major depression in remission (CMS-HCC)    Mediastinal lymphadenopathy    Osteopenia of lumbar spine  Recurrent depression (CMS-HCC)    Hypertension associated with type 2 diabetes mellitus (CMS-HCC)        Current medications, allergies and problem list were reviewed today from the EMR central listing.     Outpatient Medications Marked as Taking for the 12/26/22 encounter (Office Visit) with Samanthan Dugo Kaur, DO   Medication Sig Dispense Refill    atorvastatin  (LIPITOR) 20 MG tablet Take 1 tablet (20 mg) by mouth daily. 90 tablet 1    biotin  1000 MCG tablet Take 1 tablet (1,000 mcg) by mouth daily.      blood glucose meter PHARMACY: Please dispense insurance approved bluetooth enable meter. 1 each 0    Cyanocobalamin  (VITAMIN B 12 PO)       ferrous sulfate  325 (65 Fe) MG tablet Take 1 tablet (325 mg) by mouth daily.      fluoxetine  (PROZAC ) 40 MG capsule Take 1 capsule (40 mg) by mouth daily. 90 capsule 3    glucose blood test strip 1 strip by Other route every morning (before breakfast). 100 strip 2    glucose blood test strip 1 strip by Other route 2 times daily (before meals). 100 strip 11    lancets 1 each by Other route every morning (before breakfast). 100 each 2    lancets 1 each by Other route 2 times daily (before meals). 100 Lancet. 5    metFORMIN  (GLUCOPHAGE ) 500 mg tablet Take 1 tablet (500 mg) by mouth every morning AND 2 tablets (1,000 mg) every evening. 270 tablet 1    Multiple Vitamin (MULTIVITAMIN) TABS tablet Take 1 tablet by mouth daily.      omeprazole  (PRILOSEC) 20 MG capsule Take 1 capsule (20 mg) by mouth every morning (before breakfast). 90 capsule 3    ramipril  (ALTACE ) 2.5 MG capsule Take 1 capsule (2.5 mg) by mouth daily. 90 capsule 3    umeclidinium-vilanterol (ANORO ELLIPTA ) 62.5-25 MCG/ACT inhaler Inhale 1 puff by mouth daily. 60 each 11    VENTOLIN  HFA 108 (90 Base) MCG/ACT inhaler USE 2 INHALATIONS BY MOUTH  EVERY 6 HOURS AS NEEDED FOR WHEEZING 72 g 3    vitamin D3 125 MCG (5000 UT) capsule Take 1 capsule (5,000 Units) by mouth every 24 hours.      vitamin E  400 UNIT capsule Take 1 capsule (400 Units) by mouth daily.          Allergies   Allergen Reactions    Walnuts Swelling     Tongue swollen and burn        Past Surgical History:  Past Surgical History:   Procedure Laterality Date    BRONCHOSCOPY  12/2020    BRAIN SURGERY  12, 2021    CESAREAN SECTION, LOW TRANSVERSE      COLON SURGERY  2009    COLOSTOMY      CRANIOTOMY      LUNG LOBECTOMY          Social History:  Social History     Social History Narrative    Lives with brother Tiffany Chen at home. Sister also lives in [place]        Family History:  Family History   Problem Relation Name Age of Onset    Cancer Mother Tiffany Chen    Heart Disease Mother Tiffany Chen         Congrestive heart failure due to cancer    Hypertension Mother Tiffany Chen  [facility]     Psychiatry Mother Tiffany Chen         Bi polar, manic depressive    Cancer Father Tiffany Chen         Prostate    Diabetes Father Tiffany Chen     Stroke Father Tiffany Chen     Stroke M Grandmother Tiffany Chen     Cancer Brother Tiffany Chen         Skin (many locations)    Diabetes Brother Tiffany Chen         Exam::  BP 135/85 (BP Location: Left arm, BP Patient Position: Sitting, BP cuff size: Regular)   Pulse 78   Temp 96.7 F (35.9 C) (Temporal)   Resp 18   Ht 5\' 3"  (1.6 m)   Wt 68 kg (150 lb)   SpO2 98%   BMI 26.57 kg/m   body mass index is 26.57 kg/m.  Const: well developed, NAD.   HEENT:  -Eyes-normal appearance, conjunctiva normal, sclerae anicteric   CV: RRR, S1, S2, no murmurs,  no lower extremity edema  Resp: CTAB, good effort, no accessory mm use.   GI: Soft, nontender, nondistended, normoactive bowel sounds, ileostomy bag along lower mid abdomen, no sign of infection or drainage around ileostomy site  Skin: No rashes. Warm, dry.  Neuro: Grossly alert. Moving all 4 extremities.   Psych: Congruent affect. Fair insight       ASSESSMENT AND PLAN:  Type 2 diabetes mellitus without complication, without long-term current use of insulin  (CMS-HCC)  Hypertension associated with type 2 diabetes mellitus (CMS-HCC)  Hyperlipidemia associated with type 2 diabetes mellitus (CMS-HCC)  Elevated BP  Continue metformin  500 mg in AM and 1,000 mg in PM. - 08/05/22 A1C 6.2, has been in 6 range since 2023. Will repeat A1C  A. Retinopathy: Last eye exam 01/2022, repeat 01/2023  B. Nephropathy: UMA last 12/2021, due, ordered.  CMP 11/20/22 WNL  C. Neuropathy: Foot exam 04/2021, repeat 04/2022  D. Lipids: Lipids LDL 40 10/14/22. Continue atorvastatin  20mg  daily  E. Macrovascular: No evidence of macrovascular disease   F: Blood pressure: BP elevated today. She related elevated BP to her mood. Prefers not  to check home BP. Will have her return in 1 month for recheck. Goal<130/<80. Continue ramapril as prescribed  - Glycosylated Hgb(A1C), Blood Lavender; Future  - Random Urine Microalb/Creat Ratio Panel; Future     Mycobacterium avium complex (CMS-HCC)  s/p Robotic right upper lobectomy with en bloc lower and middle lobe wedge resection, middle lobe wedge resection  S/P completion of antibiotic treatment 06/08/2022, S/P DA VINCI XI-Right Robotic upper lobectomy with middle and lower  lobe wedge resection on 03/20/2021.  EGD with [facility] 02/01/2021 showed significant reflux. Referred to Gen surgery for consideration of surgical intervention to decreae risk of recurrent lung infections.   She is scheduled to undergo hiatal hernia repair and gastric fundoplication takedown in Feb 2025  Continue airway clearance with nebulized hypertonic saline twice daily and regular exercise    Ventral hernia without obstruction or gangrene  GERD  Plan for [facility] repair and fundoplication in Feb 2025. Continue omeprazole  20mg  daily    Recurrent depression (CMS-HCC)  Increased mood symptoms in setting of anniversary of death of son and husband. She denies SI/HI. Support and counseling given today. Continue fluoxetine  40mg  daily    Right-sided middle cerebral artery branch aneurysm s/p embolization on 06/2019  MRA 06/06/2021 shows no residual, she had successful treatment of the aneurysm and no other  lesion  BP elevated today. She related elevated BP to her mood. Prefers not to check home BP. Will have her return in 1 month for recheck. Goal<130/<80. 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 Recommended shingrix at local pharmacy, Tdap at local pharmacy, covid vaccination     RV 1 month and 4 months    I  personally spent 40 minutes in face-to-face and non-face-to-face activities related to the patient's visit today, excluding any separately reportable services/procedures.

## 2022-12-25 NOTE — Patient Instructions (Addendum)
Great to meet you today    Labs ordered. Can complete today.    Will see you back in 1 month to followup and again in 4 months

## 2022-12-26 ENCOUNTER — Ambulatory Visit (INDEPENDENT_AMBULATORY_CARE_PROVIDER_SITE_OTHER): Payer: Medicare Other | Admitting: Internal Medicine

## 2022-12-26 ENCOUNTER — Other Ambulatory Visit: Payer: Medicare Other | Attending: Internal Medicine

## 2022-12-26 ENCOUNTER — Encounter (INDEPENDENT_AMBULATORY_CARE_PROVIDER_SITE_OTHER): Payer: Self-pay | Admitting: Internal Medicine

## 2022-12-26 VITALS — BP 135/85 | HR 78 | Temp 96.7°F | Resp 18 | Ht 63.0 in | Wt 150.0 lb

## 2022-12-26 DIAGNOSIS — I152 Hypertension secondary to endocrine disorders: Secondary | ICD-10-CM

## 2022-12-26 DIAGNOSIS — Z902 Acquired absence of lung [part of]: Secondary | ICD-10-CM

## 2022-12-26 DIAGNOSIS — E785 Hyperlipidemia, unspecified: Secondary | ICD-10-CM

## 2022-12-26 DIAGNOSIS — I671 Cerebral aneurysm, nonruptured: Secondary | ICD-10-CM

## 2022-12-26 DIAGNOSIS — D809 Immunodeficiency with predominantly antibody defects, unspecified: Secondary | ICD-10-CM

## 2022-12-26 DIAGNOSIS — Z932 Ileostomy status: Secondary | ICD-10-CM

## 2022-12-26 DIAGNOSIS — E119 Type 2 diabetes mellitus without complications: Secondary | ICD-10-CM

## 2022-12-26 DIAGNOSIS — K439 Ventral hernia without obstruction or gangrene: Secondary | ICD-10-CM

## 2022-12-26 DIAGNOSIS — M8588 Other specified disorders of bone density and structure, other site: Secondary | ICD-10-CM

## 2022-12-26 DIAGNOSIS — Z7689 Persons encountering health services in other specified circumstances: Secondary | ICD-10-CM

## 2022-12-26 DIAGNOSIS — D801 Nonfamilial hypogammaglobulinemia: Secondary | ICD-10-CM

## 2022-12-26 DIAGNOSIS — E1169 Type 2 diabetes mellitus with other specified complication: Secondary | ICD-10-CM

## 2022-12-26 DIAGNOSIS — I10 Essential (primary) hypertension: Secondary | ICD-10-CM

## 2022-12-26 DIAGNOSIS — A310 Pulmonary mycobacterial infection: Secondary | ICD-10-CM

## 2022-12-26 DIAGNOSIS — F339 Major depressive disorder, recurrent, unspecified: Secondary | ICD-10-CM

## 2022-12-26 DIAGNOSIS — Z7984 Long term (current) use of oral hypoglycemic drugs: Secondary | ICD-10-CM

## 2022-12-26 DIAGNOSIS — E1159 Type 2 diabetes mellitus with other circulatory complications: Secondary | ICD-10-CM

## 2022-12-26 LAB — LIVER PANEL, BLOOD
ALT (SGPT): 25 U/L (ref 0–33)
AST (SGOT): 23 U/L (ref 0–32)
Albumin: 4.2 g/dL (ref 3.5–5.2)
Alkaline Phos: 80 U/L (ref 40–130)
Bilirubin, Dir: <0.2 mg/dL (ref <=0.2)
Bilirubin, Tot: 0.25 mg/dL (ref <1.2)
Total Protein: 7.4 g/dL (ref 6.0–8.0)

## 2022-12-26 LAB — GLYCOSYLATED HGB(A1C), BLOOD: Glyco Hgb (A1C): 6.6 % — ABNORMAL HIGH (ref 4.8–5.8)

## 2022-12-26 LAB — RANDOM URINE MICROALB/CREAT RATIO PANEL
Creatinine, Urine: 146 mg/dL (ref 29–226)
MALB/CR Ratio Random: UNDETERMINED ug/mg{creat} (ref <30)
Microalbumin, Urine: <1.2 mg/dL (ref <2.0)

## 2022-12-26 NOTE — Interdisciplinary (Signed)
Blood drawn from right arm with 23 gauge needle. 2 tubes taken.   Patient identity authenticated by Helen Grace Rasch.

## 2022-12-27 ENCOUNTER — Encounter (INDEPENDENT_AMBULATORY_CARE_PROVIDER_SITE_OTHER): Payer: Self-pay | Admitting: Hospital

## 2023-01-06 ENCOUNTER — Ambulatory Visit
Admission: RE | Admit: 2023-01-06 | Discharge: 2023-01-06 | Disposition: A | Discharge status: Home Health | Payer: Medicare Other

## 2023-01-12 ENCOUNTER — Ambulatory Visit
Admission: RE | Admit: 2023-01-12 | Discharge: 2023-01-12 | Disposition: A | Discharge status: Home / Self Care | Payer: Medicare Other | Attending: Allergy | Admitting: Allergy

## 2023-01-12 VITALS — BP 130/73 | HR 64 | Temp 97.9°F | Resp 16 | Ht 63.0 in | Wt 156.1 lb

## 2023-01-12 DIAGNOSIS — D809 Immunodeficiency with predominantly antibody defects, unspecified: Secondary | ICD-10-CM | POA: Insufficient documentation

## 2023-01-12 MED ORDER — DEXTROSE 5 % IV SOLN
INTRAVENOUS | Status: DC
Start: 2023-01-12 — End: 2023-01-12

## 2023-01-12 MED ORDER — ACETAMINOPHEN 325 MG PO TABS
650.0000 mg | ORAL_TABLET | Freq: Once | ORAL | Status: AC
Start: 2023-01-12 — End: 2023-01-12
  Administered 2023-01-12: 650 mg via ORAL
  Filled 2023-01-12: qty 2

## 2023-01-12 MED ORDER — IMMUNE GLOBULIN (HUMAN) 20 GM/200ML IJ SOLN
0.5000 g/kg | Freq: Once | INTRAMUSCULAR | Status: AC
Start: 2023-01-12 — End: 2023-01-12
  Administered 2023-01-12: 25 g via INTRAVENOUS
  Filled 2023-01-12: qty 200

## 2023-01-12 MED ORDER — CETIRIZINE HCL 10 MG OR TABS
10.0000 mg | ORAL_TABLET | Freq: Once | ORAL | Status: AC
Start: 2023-01-12 — End: 2023-01-12
  Administered 2023-01-12: 10 mg via ORAL
  Filled 2023-01-12: qty 1

## 2023-01-12 MED ORDER — DIPHENHYDRAMINE HCL 25 MG OR TABS OR CAPS CUSTOM
25.0000 mg | ORAL_CAPSULE | Freq: Once | ORAL | Status: DC | PRN
Start: 2023-01-12 — End: 2023-01-12

## 2023-01-12 NOTE — Interdisciplinary (Signed)
 Nursing Note - [facility]  78 year old female @ [facility] for IVIG    ICD-10-CM ICD-9-CM   1. 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   Access:  R hand PIV  Treatment:  Patient tolerated infusion well and without complication.   PIV flushed, removed, and pressure dressing applied  Port flushed per policy, needle removed, and pressure dressing applied  Treatment Education:   Information/teaching given to patient including: signs and symptoms of infection, bleeding, adverse reaction(s), symptom control, and when to notify MD.  Discharge Plan:  Return in 22 days (on 02/03/2023) for NONCHEMOTHERAPY.  Discharge instructions given to patient.  Future appointments:date given and reviewed with treatment plan.  Discharge Mode: Ambulatory  Discharge Time: 1235  Accompanied by: Self  Discharged To: Home   Treatment Administration:  Medications   dextrose  5% infusion (0 mL IntraVENOUS Stopped 01/12/23 1235)   diphenhydrAMINE  (BENADRYL ) tablet or capsule 25 mg (has no administration in time range)   acetaminophen  (TYLENOL ) tablet 650 mg (650 mg Oral Given 01/12/23 0957)   cetirizine  (ZYRTEC ) tablet 10 mg (10 mg Oral Given 01/12/23 0957)   immune globulin  (human) (IgG) 25 g in 250 mL (GAMUNEX-C) infusion (0 g IntraVENOUS Completed 01/12/23 1230)

## 2023-01-13 NOTE — Addendum Note (Signed)
 Encounter addended by: Gjergji, Elona on: 01/13/2023 4:49 AM   Actions taken: Charge Capture section accepted

## 2023-01-15 ENCOUNTER — Encounter (INDEPENDENT_AMBULATORY_CARE_PROVIDER_SITE_OTHER): Payer: Self-pay | Admitting: Hospital

## 2023-01-15 NOTE — Telephone Encounter (Signed)
Left message for patient to call back.  Call Center agent, if applicable please assist with:  Relaying Message  Book appointment if needed  Verify best way to reach patient  Please don't create new encounter.  Thank you.      LVM re appt change

## 2023-01-22 NOTE — Progress Notes (Signed)
 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 Feels like its herniated. She has sudden onset cramp twitch sensation in right upper abdomen occurring with positions changes, feels like a leg cramp but in her RUQ. Lasts for minutes or can ache for an hour. Improved with applying pressure, blunting the area, or changing positions. Sx unchanged since last visit. No N/V, constipation, diarrhea, blood in stool, stable solid and liquid intermittent dysphagia, odynophagia. She has loose stool at baseline from ileostomy.  In AM her calves will sometimes cramp.  She drinks plenty of water.  Surgery scheduled for 2/3  CT chest in Nov with no significant lung findings, abd normal    ROS: as above or otherwise neg     Patient Active Problem List   Diagnosis    Acquired tracheobronchomegaly with bronchiectasis (CMS-HCC)    MAI (mycobacterium avium-intracellulare) (CMS-HCC)    Cavitary lesion of lung    Gastroesophageal reflux disease, unspecified whether esophagitis present    Hoarse voice quality    Immunodeficiency with  predominantly antibody defects (CMS-HCC)    Mycobacterium avium complex (CMS-HCC)    s/p Robotic right upper lobectomy with en bloc lower and middle lobe wedge resection, middle lobe wedge resection    Status post Hartmann procedure (CMS-HCC)    Ventral hernia without obstruction or gangrene    Colostomy status (CMS-HCC)    Status post cataract extraction and insertion of intraocular lens of right eye    Status post cataract extraction and insertion of intraocular lens of left eye    Type 2 diabetes mellitus without complication, without long-term current use of insulin  (CMS-HCC)    Pseudophakia of both eyes    Posterior vitreous detachment    S/P repair of ventral hernia    Ileostomy in place (CMS-HCC)    Unspecified mood (affective) disorder (CMS-HCC)    Hyperlipidemia associated with type 2 diabetes mellitus (CMS-HCC)    Nonruptured cerebral aneurysm    Recurrent major depression in remission (CMS-HCC)    Mediastinal lymphadenopathy    Osteopenia of lumbar spine    Recurrent depression (CMS-HCC)    Hypertension associated with type 2 diabetes mellitus (CMS-HCC)        Current medications, allergies and problem list were reviewed today from the EMR central listing.     Outpatient Medications Marked as Taking for the 01/23/23 encounter (Office Visit) with Christeen Lai Kaur, DO   Medication Sig Dispense Refill    atorvastatin  (LIPITOR) 20 MG tablet Take 1 tablet (20 mg) by mouth daily. 90 tablet 1  biotin  1000 MCG tablet Take 1 tablet (1,000 mcg) by mouth daily.      blood glucose meter PHARMACY: Please dispense insurance approved bluetooth enable meter. 1 each 0    Cyanocobalamin (VITAMIN B 12 PO)       ferrous sulfate  325 (65 Fe) MG tablet Take 1 tablet (325 mg) by mouth daily.      fluoxetine  (PROZAC ) 40 MG capsule Take 1 capsule (40 mg) by mouth daily. 90 capsule 3    glucose blood test strip 1 strip by Other route every morning (before breakfast). 100 strip 2    glucose blood test strip 1 strip by Other  route 2 times daily (before meals). 100 strip 11    lancets 1 each by Other route every morning (before breakfast). 100 each 2    lancets 1 each by Other route 2 times daily (before meals). 100 Lancet. 5    metFORMIN  (GLUCOPHAGE ) 500 mg tablet Take 1 tablet (500 mg) by mouth every morning AND 2 tablets (1,000 mg) every evening. 270 tablet 1    Multiple Vitamin (MULTIVITAMIN) TABS tablet Take 1 tablet by mouth daily.      omeprazole  (PRILOSEC) 20 MG capsule Take 1 capsule (20 mg) by mouth every morning (before breakfast). 90 capsule 3    ramipril  (ALTACE ) 2.5 MG capsule Take 1 capsule (2.5 mg) by mouth daily. 90 capsule 3    umeclidinium-vilanterol (ANORO ELLIPTA ) 62.5-25 MCG/ACT inhaler Inhale 1 puff by mouth daily. 60 each 11    VENTOLIN  HFA 108 (90 Base) MCG/ACT inhaler USE 2 INHALATIONS BY MOUTH  EVERY 6 HOURS AS NEEDED FOR WHEEZING 72 g 3    vitamin D3 125 MCG (5000 UT) capsule Take 1 capsule (5,000 Units) by mouth every 24 hours.      vitamin E  400 UNIT capsule Take 1 capsule (400 Units) by mouth daily.          Allergies   Allergen Reactions    Walnuts Swelling     Tongue swollen and burn        Past Surgical History:  Past Surgical History:   Procedure Laterality Date    BRONCHOSCOPY  12/2020    BRAIN SURGERY  12, 2021    CESAREAN SECTION, LOW TRANSVERSE      COLON SURGERY  2009    COLOSTOMY      CRANIOTOMY      LUNG LOBECTOMY          Social History:  Social History     Social History Narrative    Lives with brother Lynwood at home. Sister also lives in [place]        Family History:  Family History   Problem Relation Name Age of Onset    Cancer Mother Jenkins Macario Dutton ngs and throat    Heart Disease Mother Jenkins Macario         Congrestive heart failure due to cancer    Hypertension Mother Jenkins Macario     Psychiatry Mother Jenkins Macario         Bi polar, manic depressive    Cancer Father Elsie Macario         Prostate    Diabetes Father Elsie Macario     Stroke Father Elsie Macario     Stroke M Grandmother Winifred Macario      Cancer Brother Lynwood Macario         Skin (many locations)  Diabetes Brother Lynwood Helling         Exam::  BP 117/65 (BP Location: Right arm, BP Patient Position: Sitting, BP cuff size: Regular)   Pulse 73   Temp 96.9 F (36.1 C) (Temporal)   Resp 18   Ht 5' 3 (1.6 m)   Wt 68.9 kg (152 lb)   SpO2 97%   BMI 26.93 kg/m   body mass index is 26.93 kg/m.  Const: well developed, NAD.   HEENT:  -Eyes-normal appearance, conjunctiva normal, sclerae anicteric  CV: RRR, S1, S2, no murmurs, gallops, rubs, no lower extremity edema  Resp: CTAB, good effort, no accessory mm use.   GI: Soft, nondistended, normoactive bowel sounds, ileostomy bag in place.   Mild TTP along RUQ without rebound or guarding.  Exam with reproducible pain and TTP along right upper abdomen with Carnetts maneuver   Neuro: Grossly alert. Moving all 4 extremities.   Psych: Euthymic affect. Fair insight     ASSESSMENT AND PLAN:  Right upper abdominal pain  History and exam today suggests right upper abdominal pain is myofascial/muscle spasm in nature  Discussed stretches, yoga manuvers to help with abd wall pain  Ordered RUQ abd US  to ensure no underling liver/biliary etiology  Return inst given  - US  Abdomen Limited; Future    Type 2 diabetes mellitus without complication, without long-term current use of insulin  (CMS-HCC)  Hypertension associated with type 2 diabetes mellitus (CMS-HCC)  Hyperlipidemia associated with type 2 diabetes mellitus (CMS-HCC)  Elevated BP  A1C repeated 12/26/22 6.6, overall reasonable control. Will plan to repeat in 6 months May-June 2025  Continue metformin  500 mg in AM and 1,000 mg in PM for now.   A. Retinopathy: Last eye exam 01/2022, repeat 01/2023  B. Nephropathy: UMA last 12/26/22 2mg /dl, CMP 88/86/75 WNL. Continue to monitor  C. Neuropathy: Foot exam 04/2021, repeat 04/2022  D. Lipids: Lipids LDL 40 10/14/22. Continue atorvastatin  20mg  daily  E. Macrovascular: No evidence of macrovascular disease   F: Blood  pressure: BP elevated today. She related elevated BP to her mood. Prefers not to check home BP. BP today 117/65 at goal. Continue ramapril as prescribed    Ventral hernia without obstruction or gangrene  GERD  Plan for [facility] repair and fundoplication in Feb 2025. Continue omeprazole  20mg  daily     RV in May for CDM

## 2023-01-23 ENCOUNTER — Ambulatory Visit (INDEPENDENT_AMBULATORY_CARE_PROVIDER_SITE_OTHER): Payer: Medicare Other | Admitting: Internal Medicine

## 2023-01-23 ENCOUNTER — Encounter (INDEPENDENT_AMBULATORY_CARE_PROVIDER_SITE_OTHER): Payer: Self-pay | Admitting: Internal Medicine

## 2023-01-23 VITALS — BP 117/65 | HR 73 | Temp 96.9°F | Resp 18 | Ht 63.0 in | Wt 152.0 lb

## 2023-01-23 DIAGNOSIS — R1011 Right upper quadrant pain: Secondary | ICD-10-CM

## 2023-01-23 DIAGNOSIS — E119 Type 2 diabetes mellitus without complications: Secondary | ICD-10-CM

## 2023-01-23 NOTE — Patient Instructions (Signed)
 Exam today suggests your right upper abdominal pain is myofascial/muscle spasm in nature    Try stretches that we reviewed    Abdominal ultrasound ordered    Will see you back in May

## 2023-01-29 ENCOUNTER — Ambulatory Visit: Payer: Medicare Other | Admitting: Critical Care Medicine

## 2023-01-29 ENCOUNTER — Ambulatory Visit (INDEPENDENT_AMBULATORY_CARE_PROVIDER_SITE_OTHER): Payer: Medicare Other | Admitting: Allergy

## 2023-01-29 VITALS — BP 146/72 | HR 76 | Temp 95.5°F | Resp 16 | Ht 63.0 in | Wt 150.0 lb

## 2023-01-29 DIAGNOSIS — D809 Immunodeficiency with predominantly antibody defects, unspecified: Secondary | ICD-10-CM

## 2023-01-29 DIAGNOSIS — J479 Bronchiectasis, uncomplicated: Secondary | ICD-10-CM

## 2023-01-29 NOTE — Patient Instructions (Signed)
After visit summary:    Continue IVIG at [facility] monthly     Follow-up in 6 months

## 2023-01-29 NOTE — Progress Notes (Unsigned)
Allergy/Immunology Follow-up    Date: 01/29/2023  ______________________________________________________________________    HPI: Tiffany Chen is a 78 year old female here for follow-up for immunodeficiency. Patient was last seen on 07/31/2022 at which time recommendations were for continuation of IgG replacement therapy Gamunex-C at 25g every 4 weeks. IgG 888 10/2022. Did have a cold for 3 weeks requiring ED visit (coughing, vomiting) in November. Discussed given weight changes, and drop in IgG to increase dosing to 30g every 4 weeks.     Follow-up 07/31/2022: Patient was last seen on 01/30/2022 at which time recommendations were continuation of IgG replacement therapy with Gamunex-C at 25g every 4 weeks, which is tolerated.     Follow-up 01/30/22: Patient was last seen on 08/01/2021 at which time recommendations were for continuation of IgG replacement therapy ( Gamunex-C at 25g every 4 weeks ) with IgG check. Last IgG 936 in November 2023. AST slightly elevated. Pending hernia repair/GI surgery. She has noted a significant positive impact on infusions. Tolerating infusions well.     Follow-up 08/01/21: Patient was last seen on 03/19/2021 at which time recommendations were for continuation of IgG replacement therapy Gamunex-C at 25g every 4 weeks. IgG 1030 1 month ago. CBC without cytopenia.    Follow-up 03/19/21: Patient was last seen on 12/18/2020 at which time recommendations were to proceed with IVIG at [facility] starting at 500mg /kg. Patient has tolerated IVIG well, and is now s/p 3 infusions. She will be undergoing lung lobectomy on 3/14.     Follow-up 12/18/20: Patient was last seen on 09/07/2020 at which time recommendations were for additional immune work-up including repeat quantitative immunoglobulins as well as vaccine titers (tetanus and pneumococcal). Pneumovax-23 administered 09/22/20. Only 14/23 protective at recheck. Discussion on IVIG replacement therapy today, and patient has informed  Tuesday or Wed in 2 weeks at [facility] would be ideal.     Initial consultation: Prior medical records have been reviewed carefully prior to this consultation and salient details have been summarized below. Patient has a history of bronchiectasis, cavitary lung infection (MAC). There is no history of chronic sinusitis. She has had multiple rounds of treatment for MAC, with most recent antibiotic regimen including amikacin, azithromycin, ethambutol and rifampin.     Environmental and social history:  Tobacco smoke exposure:   Social History     Tobacco Use   Smoking Status Never   Smokeless Tobacco Never         Past Medical and Surgical History:  Past Medical History:   Diagnosis Date    Acquired tracheobronchomegaly with bronchiectasis (CMS-HCC) 05/22/2020    Age-related nuclear cataract of left eye 10/15/2021    Cavitary lesion of lung 07/24/2020    Chronic obstructive airway disease (CMS-HCC) 2018    MAC    Combined forms of age-related cataract of both eyes 10/15/2021    Depression     DM (diabetes mellitus) (CMS-HCC)     Gastroesophageal reflux disease, unspecified whether esophagitis present 10/12/2020    Added automatically from request for surgery [id number]    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)     s/p Robotic right upper lobectomy with en bloc lower and middle lobe wedge resection, middle lobe wedge resection 03/20/2021    03/20/21 Robotic right upper lobectomy with en bloc lower and middle lobe wedge resection, middle lobe wedge resection (  [facility])     Past Surgical History:   Procedure Laterality Date    BRONCHOSCOPY  12/2020    BRAIN SURGERY  12, 2021    CESAREAN SECTION, LOW TRANSVERSE      COLON SURGERY  2009    COLOSTOMY      CRANIOTOMY      LUNG LOBECTOMY         Allergies:  Allergies    Allergen Reactions    Walnuts Swelling     Tongue swollen and burn       Medications:    Current Outpatient Medications:     atorvastatin (LIPITOR) 20 MG tablet, Take 1 tablet (20 mg) by mouth daily., Disp: 90 tablet, Rfl: 1    biotin 1000 MCG tablet, Take 1 tablet (1,000 mcg) by mouth daily., Disp: , Rfl:     blood glucose meter, PHARMACY: Please dispense insurance approved bluetooth enable meter., Disp: 1 each, Rfl: 0    Cyanocobalamin (VITAMIN B 12 PO), , Disp: , Rfl:     ferrous sulfate 325 (65 Fe) MG tablet, Take 1 tablet (325 mg) by mouth daily., Disp: , Rfl:     fluoxetine (PROZAC) 40 MG capsule, Take 1 capsule (40 mg) by mouth daily., Disp: 90 capsule, Rfl: 3    glucose blood test strip, 1 strip by Other route every morning (before breakfast)., Disp: 100 strip, Rfl: 2    glucose blood test strip, 1 strip by Other route 2 times daily (before meals)., Disp: 100 strip, Rfl: 11    lancets, 1 each by Other route every morning (before breakfast)., Disp: 100 each, Rfl: 2    lancets, 1 each by Other route 2 times daily (before meals)., Disp: 100 Lancet., Rfl: 5    metFORMIN (GLUCOPHAGE) 500 mg tablet, Take 1 tablet (500 mg) by mouth every morning AND 2 tablets (1,000 mg) every evening., Disp: 270 tablet, Rfl: 1    Multiple Vitamin (MULTIVITAMIN) TABS tablet, Take 1 tablet by mouth daily., Disp: , Rfl:     omeprazole (PRILOSEC) 20 MG capsule, Take 1 capsule (20 mg) by mouth every morning (before breakfast)., Disp: 90 capsule, Rfl: 3    ramipril (ALTACE) 2.5 MG capsule, Take 1 capsule (2.5 mg) by mouth daily., Disp: 90 capsule, Rfl: 3    sodium chloride 7 % NEBU, USE 1 VIAL VIA NEBULIZER  EVERY 12 HOURS, Disp: 720 mL, Rfl: 3    umeclidinium-vilanterol (ANORO ELLIPTA) 62.5-25 MCG/ACT inhaler, Inhale 1 puff by mouth daily., Disp: 60 each, Rfl: 11    VENTOLIN HFA 108 (90 Base) MCG/ACT inhaler, USE 2 INHALATIONS BY MOUTH  EVERY 6 HOURS AS NEEDED FOR WHEEZING, Disp: 72 g, Rfl: 3    vitamin D3 125 MCG (5000 UT) capsule,  Take 1 capsule (5,000 Units) by mouth every 24 hours., Disp: , Rfl:     vitamin E 400 UNIT capsule, Take 1 capsule (400 Units) by mouth daily., Disp: , Rfl:     FH: No FH of atopic disease: asthma, allergic rhinitis, eczema, food allergies    Diagnostic studies:              IMPRESSION:  1. Post interval right upper lobectomy with a small residual right hydropneumothorax.      2. Scattered tree-in-bud airspace opacities throughout the lungs, similar to slightly improved.  Previously seen patchy groundglass opacities within the lungs appear improved.    3. Mediastinal lymphadenopathy.  This appears slightly increased in comparison to the prior study.  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 Bronchiectasis   3. 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 Bronchiectasis   3. MAC infection  Patient presents for follow-up for immunodeficiency with antibody defects. Given her history of persistent MAC infection, bronchiectasis, low IgM, low IgG subclass, and poor vaccine titers, advise continuation of IgG replacement therapy. IgG replacement infusion therapy is well tolerated, is medically indicated given the above clinical profile and concerns for evolution to CVID given low IgM and low IgG subclass 2/3. IgG has been lower than ideal [id number] range with last IgG trough at 888 in October 2024. Will increase to 30g every 4 weeks. Continues bronchiectasis / MAC management with Dr. Fransico Michael.      We have discussed both intravenous and subcutaneous immunoglobulin replacement therapy with the patient and she would like to continue with IVIG at [facility] - Tuesdays/Wednesdays best days. Risks of immunoglobulin replacement therapy  were carefully reviewed with the patient as there are several black box warnings on the products. Although the risks general occur more in high dose immunoglobulin replacement regimens such as in neurological disorders, we have seen severe adverse effects at PID dosing and advise all patients that these risks are real, severe and capable of presenting at any dosing scheme. We have reviewed anaphylaxis, shock, death, pulmonary embolism, DVT, hemolytic disorders, acute renal failure (we have seen acute renal dysfunction but not failure in non-sucrose formulations), aseptic meningitis, TRALI/pulmonary edema, swelling, weight gain and local infusion site reactions/pain.   -Will continue at dosing Gamunex-C at 30 g every 4 weeks  -Recommendations are for immunoglobulin replacement therapy  -Risks, alternatives, benefits discussed as above  -Risk of severe progression of lung disease and life-threatening infections if immunoglobulin replacement is not approved and has been clearly documented above in terms of reasons to support prescription; failure to approve could lead to life-threatening pulmonary consequences   -IgG check prior to infusions    RTC 6 months     The plan of care was discussed with the patient who expressed agreement and understanding. Questions were answered prior to conclusion of the visit.     Johnnye Sima, MD  Clinical Associate Professor, [facility]

## 2023-02-03 ENCOUNTER — Ambulatory Visit
Admission: RE | Admit: 2023-02-03 | Discharge: 2023-02-03 | Disposition: A | Discharge status: Home / Self Care | Payer: Medicare Other | Attending: Allergy | Admitting: Allergy

## 2023-02-03 ENCOUNTER — Ambulatory Visit (INDEPENDENT_AMBULATORY_CARE_PROVIDER_SITE_OTHER): Payer: Medicare Other | Admitting: Ophthalmology

## 2023-02-03 VITALS — BP 143/77 | HR 77 | Temp 96.9°F | Resp 16 | Wt 156.3 lb

## 2023-02-03 DIAGNOSIS — E119 Type 2 diabetes mellitus without complications: Secondary | ICD-10-CM

## 2023-02-03 DIAGNOSIS — Z961 Presence of intraocular lens: Secondary | ICD-10-CM

## 2023-02-03 DIAGNOSIS — D809 Immunodeficiency with predominantly antibody defects, unspecified: Secondary | ICD-10-CM | POA: Insufficient documentation

## 2023-02-03 LAB — IGG, BLOOD: IGG: 976 mg/dL (ref 700–1600)

## 2023-02-03 MED ORDER — CETIRIZINE HCL 10 MG OR TABS
10.0000 mg | ORAL_TABLET | Freq: Once | ORAL | Status: AC
Start: 2023-02-03 — End: 2023-02-03
  Administered 2023-02-03: 10 mg via ORAL
  Filled 2023-02-03: qty 1

## 2023-02-03 MED ORDER — ACETAMINOPHEN 325 MG PO TABS
650.0000 mg | ORAL_TABLET | Freq: Once | ORAL | Status: AC
Start: 2023-02-03 — End: 2023-02-03
  Administered 2023-02-03: 650 mg via ORAL
  Filled 2023-02-03: qty 2

## 2023-02-03 MED ORDER — IMMUNE GLOBULIN (HUMAN) 20 GM/200ML IJ SOLN
0.5500 g/kg | Freq: Once | INTRAMUSCULAR | Status: AC
Start: 2023-02-03 — End: 2023-02-03
  Administered 2023-02-03: 30 g via INTRAVENOUS
  Filled 2023-02-03: qty 100

## 2023-02-03 MED ORDER — DIPHENHYDRAMINE HCL 25 MG OR TABS OR CAPS CUSTOM
25.0000 mg | ORAL_CAPSULE | Freq: Once | ORAL | Status: DC | PRN
Start: 2023-02-03 — End: 2023-02-03

## 2023-02-03 MED ORDER — DEXTROSE 5 % IV SOLN
INTRAVENOUS | Status: DC
Start: 2023-02-03 — End: 2023-02-03

## 2023-02-03 NOTE — Interdisciplinary (Signed)
Non-Chemotherapy Infusion Nursing Note - [facility] Tiffany Chen is a 78 year old female who presents for infusion of IVIG.    Vitals:    02/03/23 0855   BP: 143/77   BP Location: Left arm   BP Patient Position: Sitting   Pulse: 77   Resp: 16   Temp: 96.9 F (36.1 C)   TempSrc: Temporal   SpO2: 99%   Weight: 70.9 kg (156 lb 4.8 oz)     Pain Score: NA (pre med, non-pain or scheduled)  Body surface area is 1.78 meters squared.  Body mass index is 27.69 kg/m.    Pre-treatment nursing assessment:  Pt denies headache, nausea, fatigue, chills, and fever.   [name] tolerated treatment well.   Post blood return: Brisk  Post-Flush: NS    Patient Education  Learner: Patient  Barriers to learning: None  Readiness to learn: Acceptance   Method: Explanation    Treatment Education: Information/teaching given to patient including: signs and symptoms of infection, bleeding, adverse reaction(s), symptom control, and when to notify MD.    [name] Prevention Education: Instructed patient to call for assistance.    Pain Education: Patient instructed to contact nurse if pain should develop or if their current pain therapy becomes ineffective.    Response: Verbalizes understanding.    Discharge Plan  Discharge instructions given to patient.  Future appointments given and reviewed with treatment plan.  Discharge Mode: Ambulatory  Discharge Time: 1130  Accompanied by: Self   Discharged To: Home    Medications   dextrose 5% infusion ( IntraVENOUS New Bag 02/03/23 0919)   diphenhydrAMINE (BENADRYL) tablet or capsule 25 mg (has no administration in time range)   acetaminophen (TYLENOL) tablet 650 mg (650 mg Oral Given 02/03/23 0919)   cetirizine (ZYRTEC) tablet 10 mg (10 mg Oral Given 02/03/23 0919)   immune globulin (human) (IgG) 30 g in 300 mL (GAMUNEX-C) infusion (30 g IntraVENOUS New Bag 02/03/23 0939)

## 2023-02-03 NOTE — Assessment & Plan Note (Signed)
Stable exam.  Patient is comfortable with uncorrected distance vision and using  OTC readers only.

## 2023-02-03 NOTE — Assessment & Plan Note (Signed)
HgbA1C 6.6 12/2022.  No diabetic retinopathy OU.

## 2023-02-03 NOTE — Progress Notes (Signed)
Pseudophakia of both eyes  Assessment & Plan:  Stable exam.  Patient is comfortable with uncorrected distance vision and using  OTC readers only.      Type 2 diabetes mellitus without complication, without long-term current use of insulin (CMS-HCC)  Assessment & Plan:  HgbA1C 6.6 12/2022.  No diabetic retinopathy OU.          No follow-ups on file.

## 2023-02-04 ENCOUNTER — Encounter (INDEPENDENT_AMBULATORY_CARE_PROVIDER_SITE_OTHER): Payer: Self-pay | Admitting: Allergy

## 2023-02-04 ENCOUNTER — Encounter (INDEPENDENT_AMBULATORY_CARE_PROVIDER_SITE_OTHER): Payer: Self-pay | Admitting: Hospital

## 2023-02-06 NOTE — Addendum Note (Signed)
Encounter addended by: Ulis Rias on: 02/06/2023 9:26 AM   Actions taken: Charge Capture section accepted

## 2023-02-07 NOTE — Interdisciplinary (Signed)
Periop Nurse Navigator called and spoke with patient (two patient identifiers confirmed). Reviewed pre op call checklist, medication list, check in time 0500 (ERAS), surgical time 0720, surgical date 02/10/2023 and check in location [facility], 1st floor admissions). Discussed with patient that check in time is subject to change and to keep phone close by. Advised patient to bring insurance card, ID, and form of payment. NPO instructions provided. Patient verbalizes understanding.    --------------------------------------------------------------------------------------------------------------     RN reviewed APC appt instructions from (11/11/2022 with Joesph July NP) as it was intended for this procedure and per management APC appt instructions are good for 6 months prior to procedure if no medical changes. RN also confirmed with [employer] policy and APC holding guidelines [employer] Guidlines 2024.pdf). Sending to surgical team and anesthesia team to review and follow up if recommendations needed to be revised.     Per APC holding guidelines and Joesph July   Patient to hold Ramipril on the morning of surgery - pt aware     Per [employer] Policy - pt aware of below instructions   On the day before surgery/procedure  Take your medications as normal    On the day of surgery/procedure  do not take Metformin-generic (Glucophage) this morning    You may be given separate instructions that you should not eat or drink anything after midnight, the night before your procedure/surgery. If you have a blood sugar below 70 after the time you are no longer supposed to take anything by mouth, you may safely treat the low sugar with either glucagon (injection or intranasal), Glucogel, or one tablespoon of granulated sugar placed under the tongue or in the cheek area. Up until 4 hours before your surgery/procedure, you may treat your low blood sugar with 4 ounces (1/2 cup) of clear juice (no pulp), or 4 ounces  (1/2 cup) of sugar containing clear soda (not a diet drink)    These are suggestions. You must take into account your current clinical condition. If you have any questions, please contact your primary care physician, endocrinologist or surgeon. If you have a medical emergency, please go to the nearest emergency room or call 911 for assistance

## 2023-02-09 NOTE — Anesthesia Preprocedure Evaluation (Addendum)
ANESTHESIA PRE-OPERATIVE EVALUATION    Patient Information    Name: Tiffany Chen    MRN: [id number]    DOB: [birth year]    Age: 78 year old    Sex: female  Procedure(s):  Laparoscopic hiatal hernia repair, gastric fundoplication, intraoperative upper endoscopy, possible mesh placement  COMPLEX ILEOSTOMY, TAKE-DOWN      Pre-op Vitals:   There were no vitals taken for this visit.        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, HTN, DM A1C 6.2% 07/2022 , ICH 2009 (no residual ), HLD, IgG immunodef    General:  negative for General ROS  Mets=4  Cardiovascular:  no CAD/Angina/MI/CABG/Stents, Anti-platelet drugs: No  hypertension,  no dysrhythmias,  no pacemaker,  no ICD,  Denies cp   EKG SR 03/2022    Anesthesia History:  negative anesthesia history ROS  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 cavitary MAC lung ifnection S/P completion of antibiotic treatment 06/08/2022, S/P DA VINCI XI-Right Robotic upper lobectomy with middle and lower  lobe wedge resection on 03/20/2021 08/29/2022: underwent bronchoscopy with biopsy of the LNs. Showed necrotizing granulomas. No malignancy.    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 IgG 888 10/2022.    GI/Hepatic:  GERD,  no esophageal varices,  no liver disease,  no pancreatic disease,   Infectious Disease:  tuberculosis (mac),     Renal:  negative renal ROS   Endocrine/Other:  diabetes,   no history of thyroid disease,   no steroid use,    no arthritis,   no back pain,   A1c ordered dos    Pregnancy History:   Pediatrics:         Pre Anesthesia Testing (PCC/CPC) notes/comments:    [facility] Test & records reviewed by [facility] Provider.                                      Physical Exam    Airway:     Inter-inciser distance 3-4 cm  Prognanth Able    Mallampati: II  Neck ROM: full  TM distance: 5-6 cm            Cardiovascular:      Rhythm: regular   Rate: normal          Pulmonary:        Positive for decreased breath sounds        Neuro/Neck/Skeletal/Skin:  Negative for  No neck abnormalities    Dental:      Abdominal:       General: normal weight      Additional Clinical Notes:             Last OSA (STOP BANG) Score:   No data recorded    Past Medical History:   Diagnosis Date   . Acquired tracheobronchomegaly with bronchiectasis (CMS-HCC) 05/22/2020   . Age-related nuclear cataract of left eye 10/15/2021   . Cavitary lesion of lung 07/24/2020   . Chronic obstructive airway disease (CMS-HCC) 2018    MAC   . Combined forms of age-related cataract of both eyes 10/15/2021   . Depression    . DM (diabetes mellitus) (CMS-HCC)    . Gastroesophageal reflux disease, unspecified whether esophagitis present 10/12/2020    Added automatically from request for surgery [id number]   . 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 s/p Robotic right upper lobectomy with en bloc lower and middle  lobe wedge resection, middle lobe wedge resection 03/20/2021    03/20/21 Robotic right upper lobectomy with en bloc lower and middle lobe wedge resection, middle lobe wedge resection (Onaitis)     Past Surgical History:   Procedure Laterality Date   . BRONCHOSCOPY  12/2020   . BRAIN SURGERY  12, 02/28/2019   . CESAREAN SECTION, LOW TRANSVERSE     . COLON SURGERY  2007/02/28   . COLOSTOMY     . CRANIOTOMY     . LUNG LOBECTOMY       none     Socioeconomic History   . Marital status: Widowed   Tobacco Use   . Smoking status: Never   . Smokeless tobacco: Never   Vaping Use   . Vaping status: Never Used   Substance and Sexual Activity   . Alcohol use: Yes     Comment: glass of wine every other month   . Drug use: Not Currently   . Sexual activity: Not Currently   Other Topics Concern   . Military Service No   . Blood Transfusions No   . Caffeine Concern No   . Occupational Exposure No   . Hobby Hazards Yes     Comment: Gardening, MAC   . Sleep Concern No   . Stress Concern Yes     Comment: Husband death Cov29, 27-Feb-2018, sold home, rehomed pets. [place]   . Weight Concern No   . Special Diet Yes   . Back Care No   . Exercises Regularly Yes   . Bike Helmet Use No   . Seat Belt Use No   . Performs Self-Exams No   Social History Narrative    Lives with brother Fayrene Fearing at home. Sister also lives in [facility]     Social Determinants of Health     Food Insecurity: No Food Insecurity (07/09/2022)    Hunger Vital Sign    . Worried About Programme researcher, broadcasting/film/video in the Last Year: Never true    . Ran Out of Food in the Last Year: Never true   Transportation Needs: No Transportation Needs (07/09/2022)    PRAPARE - Transportation    . Lack of Transportation (Medical): No    . Lack of Transportation (Non-Medical): No   Intimate Partner Violence: Low Risk  (  03/25/2022)    UC IPV    . Have you ever been emotionally or physically abused by your partner or someone important to you?: No    . Within the last year have you been hit, slapped, kicked or otherwise  physically hurt by someone?: No    . Within the last year has anyone forced you to have sexual activities?: No    . Are you afraid of your spouse or partner you listed above?: No   Housing Stability: Unknown (07/09/2022)    Housing Stability Vital Sign    . Unable to Pay for Housing in the Last Year: No    . Unstable Housing in the Last Year: No     Alcohol Use: Not on file       Current Outpatient Medications   Medication Sig Dispense Refill   . atorvastatin (LIPITOR) 20 MG tablet Take 1 tablet (20 mg) by mouth daily. 90 tablet 1   . biotin 1000 MCG tablet Take 1 tablet (1,000 mcg) by mouth daily.     . blood glucose meter PHARMACY: Please dispense insurance approved bluetooth enable meter. 1 each 0   . Cyanocobalamin (VITAMIN B 12 PO)      . ferrous sulfate 325 (65 Fe) MG tablet Take 1 tablet (325 mg) by mouth daily.     . fluoxetine (PROZAC) 40 MG capsule Take 1 capsule (40 mg) by mouth daily. 90 capsule 3   . glucose blood test strip 1 strip by Other route every morning (before breakfast). 100 strip 2   . glucose blood test strip 1 strip by Other route 2 times daily (before meals). 100 strip 11   . lancets 1 each by Other route every morning (before breakfast). 100 each 2   . lancets 1 each by Other route 2 times daily (before meals). 100 Lancet. 5   . metFORMIN (GLUCOPHAGE) 500 mg tablet Take 1 tablet (500 mg) by mouth every morning AND 2 tablets (1,000 mg) every evening. 270 tablet 1   . Multiple Vitamin (MULTIVITAMIN) TABS tablet Take 1 tablet by mouth daily.     [name] omeprazole (PRILOSEC) 20 MG capsule Take 1 capsule (20 mg) by mouth every morning (before breakfast). 90 capsule 3   . ramipril (ALTACE) 2.5 MG capsule Take 1 capsule (2.5 mg) by mouth daily. 90 capsule 3   . sodium chloride 7 % NEBU USE 1 VIAL VIA NEBULIZER  EVERY 12 HOURS 720 mL 3   . umeclidinium-vilanterol (ANORO ELLIPTA) 62.5-25 MCG/ACT inhaler Inhale 1 puff by mouth daily. 60 each 11   . VENTOLIN HFA 108 (90 Base) MCG/ACT inhaler USE 2  INHALATIONS BY MOUTH  EVERY 6 HOURS AS NEEDED FOR WHEEZING 72 g 3   . vitamin D3 125 MCG (5000 UT) capsule Take 1 capsule (5,000 Units) by mouth every 24 hours.     . vitamin E 400 UNIT capsule Take 1 capsule (400 Units) by mouth daily.       Allergies   Allergen Reactions   . Walnuts Swelling     Tongue swollen and burn       Labs and Other Data  Lab Results   Component Value Date    NA 139 11/20/2022    K 4.6 11/20/2022    CL 105 11/20/2022    BICARB 22 11/20/2022    BUN 14 11/20/2022    CREAT 0.89 11/20/2022    GLU 132 (H) 11/20/2022    [place] 9.5 11/20/2022  Lab Results   Component Value Date    AST 23 12/26/2022    ALT 25 12/26/2022    ALK 80 12/26/2022    TP 7.4 12/26/2022    ALB 4.2 12/26/2022    TBILI 0.25 12/26/2022    DBILI <0.2 12/26/2022     Lab Results   Component Value Date    WBC 9.8 11/20/2022    RBC 4.20 11/20/2022    HGB 13.2 11/20/2022    HCT 39.1 11/20/2022    MCV 93.1 11/20/2022    MCHC 33.8 11/20/2022    RDW 12.9 11/20/2022    PLT 215 11/20/2022    MPV 10.5 11/20/2022    SEG 87.5 11/20/2022    LYMPHS 5.5 11/20/2022    MONOS 6.1 11/20/2022    EOS 0.3 11/20/2022    BASOS 0.4 11/20/2022     No results found for: "INR", "PTT"  No results found for: "ARTPH", "ARTPO2", "ARTPCO2"    Anesthesia Plan:  Risks and Benefits of Anesthesia  I have personally performed an appropriate pre-anesthesia physical exam of the patient (including heart, lungs, and airway) prior to the anesthetic and reviewed the pertinent medical history, drug and allergy history, laboratory and imaging studies and consultations.   I have determined that the patient has had adequate assessment and testing.  I have validated the documentation of these elements of the patient exam and/or have made necessary changes to reflect my own observations during my pre-anesthesia exam.  Anesthetic techniques, invasive monitors, anesthetic drugs for induction, maintenance and post-operative analgesia, risks and alternatives have been explained to  the patient and/or patient's representatives.    I have prescribed the anesthetic plan:         Planned anesthesia method: General         ASA 3 (Severe systemic disease)     Potential anesthesia problems identified and risks including but not limited to the following were discussed with patient and/or patient's representative: Adverse or allergic drug reaction, Administration of blood products, Ocular injury, Dental injury or sore throat, Nerve injury and Patient declined further  discussion of risks of anesthesia    No Beta Blocker Indicated: Does not meet criteria    Planned monitoring method: Routine monitoring    Informed Consent:  Anesthetic plan and risks discussed with Patient.  Use of blood products discussed with patient who.         Plan discussed with Resident, Surgeon and Attending.

## 2023-02-10 ENCOUNTER — Inpatient Hospital Stay
Admission: RE | Admit: 2023-02-10 | Discharge: 2023-02-14 | DRG: 327 | Disposition: A | Discharge status: Home Health | Payer: Medicare Other | Attending: Surgery | Admitting: Surgery

## 2023-02-10 ENCOUNTER — Encounter (HOSPITAL_COMMUNITY): Admission: RE | Disposition: A | Discharge status: Home Health | Payer: Self-pay | Attending: Surgery

## 2023-02-10 ENCOUNTER — Ambulatory Visit (HOSPITAL_COMMUNITY): Payer: Self-pay

## 2023-02-10 ENCOUNTER — Ambulatory Visit (HOSPITAL_BASED_OUTPATIENT_CLINIC_OR_DEPARTMENT_OTHER): Payer: Medicare Other

## 2023-02-10 DIAGNOSIS — Z79899 Other long term (current) drug therapy: Secondary | ICD-10-CM

## 2023-02-10 DIAGNOSIS — K660 Peritoneal adhesions (postprocedural) (postinfection): Secondary | ICD-10-CM | POA: Diagnosis present

## 2023-02-10 DIAGNOSIS — I10 Essential (primary) hypertension: Secondary | ICD-10-CM | POA: Diagnosis present

## 2023-02-10 DIAGNOSIS — Z801 Family history of malignant neoplasm of trachea, bronchus and lung: Secondary | ICD-10-CM

## 2023-02-10 DIAGNOSIS — Z980 Intestinal bypass and anastomosis status: Secondary | ICD-10-CM

## 2023-02-10 DIAGNOSIS — Z85828 Personal history of other malignant neoplasm of skin: Secondary | ICD-10-CM

## 2023-02-10 DIAGNOSIS — Z8673 Personal history of transient ischemic attack (TIA), and cerebral infarction without residual deficits: Secondary | ICD-10-CM

## 2023-02-10 DIAGNOSIS — K219 Gastro-esophageal reflux disease without esophagitis: Principal | ICD-10-CM | POA: Diagnosis present

## 2023-02-10 DIAGNOSIS — Z9049 Acquired absence of other specified parts of digestive tract: Secondary | ICD-10-CM

## 2023-02-10 DIAGNOSIS — D803 Selective deficiency of immunoglobulin G [IgG] subclasses: Secondary | ICD-10-CM | POA: Diagnosis present

## 2023-02-10 DIAGNOSIS — R339 Retention of urine, unspecified: Secondary | ICD-10-CM | POA: Diagnosis not present

## 2023-02-10 DIAGNOSIS — Z432 Encounter for attention to ileostomy: Principal | ICD-10-CM

## 2023-02-10 DIAGNOSIS — Z800 Family history of malignant neoplasm of digestive organs: Secondary | ICD-10-CM

## 2023-02-10 DIAGNOSIS — E119 Type 2 diabetes mellitus without complications: Secondary | ICD-10-CM | POA: Diagnosis present

## 2023-02-10 DIAGNOSIS — F32A Depression, unspecified: Secondary | ICD-10-CM | POA: Diagnosis present

## 2023-02-10 DIAGNOSIS — Z932 Ileostomy status: Secondary | ICD-10-CM

## 2023-02-10 DIAGNOSIS — G8918 Other acute postprocedural pain: Principal | ICD-10-CM

## 2023-02-10 DIAGNOSIS — Z808 Family history of malignant neoplasm of other organs or systems: Secondary | ICD-10-CM

## 2023-02-10 DIAGNOSIS — K449 Diaphragmatic hernia without obstruction or gangrene: Secondary | ICD-10-CM | POA: Diagnosis present

## 2023-02-10 DIAGNOSIS — Z8042 Family history of malignant neoplasm of prostate: Secondary | ICD-10-CM

## 2023-02-10 DIAGNOSIS — Z7984 Long term (current) use of oral hypoglycemic drugs: Secondary | ICD-10-CM

## 2023-02-10 DIAGNOSIS — Z902 Acquired absence of lung [part of]: Secondary | ICD-10-CM

## 2023-02-10 DIAGNOSIS — J449 Chronic obstructive pulmonary disease, unspecified: Secondary | ICD-10-CM | POA: Diagnosis present

## 2023-02-10 LAB — GLUCOSE (POCT)
Glucose (POCT): 161 mg/dL — ABNORMAL HIGH (ref 70–99)
Glucose (POCT): 151 mg/dL — ABNORMAL HIGH (ref 70–99)
Glucose (POCT): 219 mg/dL — ABNORMAL HIGH (ref 70–99)
Glucose (POCT): 257 mg/dL — ABNORMAL HIGH (ref 70–99)
Glucose (POCT): 239 mg/dL — ABNORMAL HIGH (ref 70–99)
Glucose (POCT): 207 mg/dL — ABNORMAL HIGH (ref 70–99)
Glucose (POCT): 187 mg/dL — ABNORMAL HIGH (ref 70–99)
Glucose (POCT): 170 mg/dL — ABNORMAL HIGH (ref 70–99)

## 2023-02-10 SURGERY — FUNDOPLICATION, LAPAROSCOPIC
Anesthesia: General | Site: Esophagus | Wound class: Class II (Clean Contaminated)

## 2023-02-10 MED ORDER — INSULIN REGULAR HUMAN 100 UNIT/ML IJ SOLN
INTRAMUSCULAR | Status: DC | PRN
Start: 2023-02-10 — End: 2023-02-10
  Administered 2023-02-10: 2 [IU] via SUBCUTANEOUS

## 2023-02-10 MED ORDER — LACTATED RINGERS IV SOLN
INTRAVENOUS | Status: DC
Start: 2023-02-10 — End: 2023-02-10

## 2023-02-10 MED ORDER — TIOTROPIUM BROMIDE-OLODATEROL 2.5-2.5 MCG/ACT IN AERS
2.0000 | INHALATION_SPRAY | Freq: Every day | RESPIRATORY_TRACT | Status: DC
Start: 2023-02-11 — End: 2023-02-14
  Administered 2023-02-12 – 2023-02-14 (×3): 2 via RESPIRATORY_TRACT
  Filled 2023-02-10: qty 4

## 2023-02-10 MED ORDER — PHENYLEPHRINE DILUTION 100 MCG/ML IJ SOLN
INTRAVENOUS | Status: DC | PRN
Start: 2023-02-10 — End: 2023-02-10
  Administered 2023-02-10: 100 ug via INTRAVENOUS

## 2023-02-10 MED ORDER — FENTANYL CITRATE (PF) 250 MCG/5ML IJ SOLN
INTRAMUSCULAR | Status: AC
Start: 2023-02-10 — End: 2023-02-10
  Filled 2023-02-10: qty 5

## 2023-02-10 MED ORDER — FENTANYL CITRATE (PF) 250 MCG/5ML IJ SOLN
INTRAMUSCULAR | Status: DC | PRN
Start: 2023-02-10 — End: 2023-02-10
  Administered 2023-02-10: 50 ug via INTRAVENOUS
  Administered 2023-02-10: 150 ug via INTRAVENOUS
  Administered 2023-02-10 (×2): 50 ug via INTRAVENOUS

## 2023-02-10 MED ORDER — MIDAZOLAM HCL 2 MG/2ML IJ SOLN
INTRAMUSCULAR | Status: AC
Start: 2023-02-10 — End: 2023-02-10
  Filled 2023-02-10: qty 2

## 2023-02-10 MED ORDER — DIPHENHYDRAMINE HCL 50 MG/ML IJ SOLN
12.5000 mg | Freq: Once | INTRAMUSCULAR | Status: AC | PRN
Start: 2023-02-10 — End: 2023-02-10
  Administered 2023-02-10: 12.5 mg via INTRAVENOUS
  Filled 2023-02-10: qty 1

## 2023-02-10 MED ORDER — LABETALOL HCL 5 MG/ML IV SOLN
5.0000 mg | INTRAVENOUS | Status: AC | PRN
Start: 2023-02-10 — End: 2023-02-10

## 2023-02-10 MED ORDER — SODIUM CHLORIDE 0.9 % IV SOLN
INTRAVENOUS | Status: DC | PRN
Start: 2023-02-10 — End: 2023-02-10
  Administered 2023-02-10: 1000 mg via INTRAVENOUS

## 2023-02-10 MED ORDER — LACTATED RINGERS IV SOLN
INTRAVENOUS | Status: DC | PRN
Start: 2023-02-10 — End: 2023-02-10

## 2023-02-10 MED ORDER — ALBUTEROL SULFATE 108 (90 BASE) MCG/ACT IN AERS
2.0000 | INHALATION_SPRAY | Freq: Four times a day (QID) | RESPIRATORY_TRACT | Status: DC | PRN
Start: 2023-02-10 — End: 2023-02-14

## 2023-02-10 MED ORDER — BUPIVACAINE HCL (PF) 0.25 % IJ SOLN
INTRAMUSCULAR | Status: DC | PRN
Start: 2023-02-10 — End: 2023-02-10
  Administered 2023-02-10: 20 mL

## 2023-02-10 MED ORDER — GABAPENTIN 250 MG/5ML OR SOLN
300.0000 mg | Freq: Three times a day (TID) | ORAL | Status: DC
Start: 2023-02-10 — End: 2023-02-14
  Administered 2023-02-10 – 2023-02-14 (×12): 300 mg via ORAL
  Filled 2023-02-10 (×12): qty 6

## 2023-02-10 MED ORDER — FENTANYL CITRATE (PF) 50 MCG/ML IJ SOLN (WRAPPED RECORD) ~~LOC~~
50.0000 ug | INTRAMUSCULAR | Status: AC | PRN
Start: 2023-02-10 — End: 2023-02-10
  Administered 2023-02-10: 50 ug via INTRAVENOUS
  Filled 2023-02-10: qty 1

## 2023-02-10 MED ORDER — DEXAMETHASONE SODIUM PHOSPHATE 4 MG/ML IJ SOLN (CUSTOM)
INTRAMUSCULAR | Status: DC | PRN
Start: 2023-02-10 — End: 2023-02-10
  Administered 2023-02-10: 8 mg via INTRAVENOUS

## 2023-02-10 MED ORDER — ONDANSETRON HCL 4 MG/2ML IV SOLN
4.0000 mg | Freq: Once | INTRAMUSCULAR | Status: AC | PRN
Start: 2023-02-10 — End: 2023-02-10
  Filled 2023-02-10: qty 2

## 2023-02-10 MED ORDER — ONDANSETRON HCL 4 MG/2ML IV SOLN
4.0000 mg | Freq: Four times a day (QID) | INTRAMUSCULAR | Status: DC | PRN
Start: 2023-02-10 — End: 2023-02-14

## 2023-02-10 MED ORDER — ACETAMINOPHEN 160 MG/5ML OR SOLN
650.0000 mg | ORAL | Status: DC
Start: 2023-02-10 — End: 2023-02-14
  Administered 2023-02-10 – 2023-02-14 (×23): 650 mg via ORAL
  Filled 2023-02-10 (×24): qty 20.3

## 2023-02-10 MED ORDER — HYDROMORPHONE HCL 1 MG/ML IJ SOLN
0.5000 mg | INTRAMUSCULAR | Status: AC | PRN
Start: 2023-02-10 — End: 2023-02-10
  Administered 2023-02-10 (×3): 0.5 mg via INTRAVENOUS
  Filled 2023-02-10 (×3): qty 0.5

## 2023-02-10 MED ORDER — ROCURONIUM BROMIDE 100 MG/10ML IV SOLN
INTRAVENOUS | Status: DC | PRN
Start: 2023-02-10 — End: 2023-02-10
  Administered 2023-02-10: 100 mg via INTRAVENOUS
  Administered 2023-02-10: 50 mg via INTRAVENOUS

## 2023-02-10 MED ORDER — FAMOTIDINE (PF) 20 MG/2ML IV SOLN
INTRAVENOUS | Status: DC | PRN
Start: 2023-02-10 — End: 2023-02-10
  Administered 2023-02-10: 20 mg via INTRAVENOUS

## 2023-02-10 MED ORDER — NALOXONE HCL 0.4 MG/ML IJ SOLN
0.1000 mg | INTRAMUSCULAR | Status: DC | PRN
Start: 2023-02-10 — End: 2023-02-10

## 2023-02-10 MED ORDER — FENTANYL CITRATE (PF) 100 MCG/2ML IJ SOLN
INTRAMUSCULAR | Status: AC
Start: 2023-02-10 — End: 2023-02-10
  Filled 2023-02-10: qty 2

## 2023-02-10 MED ORDER — LANSOPRAZOLE 30 MG PO TBDD
30.0000 mg | DELAYED_RELEASE_TABLET | Freq: Every day | ORAL | Status: DC
Start: 2023-02-11 — End: 2023-02-14
  Administered 2023-02-11 – 2023-02-14 (×4): 30 mg via ORAL
  Filled 2023-02-10 (×4): qty 1

## 2023-02-10 MED ORDER — SUGAMMADEX SODIUM 200 MG/2ML IV SOLN
INTRAVENOUS | Status: DC | PRN
Start: 2023-02-10 — End: 2023-02-10
  Administered 2023-02-10: 400 mg via INTRAVENOUS

## 2023-02-10 MED ORDER — INSULIN REGULAR HUMAN 100 UNIT/ML IJ SOLN
2.0000 [IU] | Freq: Once | INTRAMUSCULAR | Status: AC
Start: 2023-02-10 — End: 2023-02-10
  Administered 2023-02-10: 2 [IU] via SUBCUTANEOUS
  Filled 2023-02-10: qty 2

## 2023-02-10 MED ORDER — PROPOFOL IV BOLUS 10 MG/ML
INTRAVENOUS | Status: DC | PRN
Start: 2023-02-10 — End: 2023-02-10
  Administered 2023-02-10: 30 mg via INTRAVENOUS
  Administered 2023-02-10: 120 mg via INTRAVENOUS

## 2023-02-10 MED ORDER — LACTATED RINGERS IV SOLN
INTRAVENOUS | Status: DC
Start: 2023-02-10 — End: 2023-02-11

## 2023-02-10 MED ORDER — LIDOCAINE HCL (PF) 1 % IJ SOLN
0.1000 mL | Freq: Once | INTRAMUSCULAR | Status: DC | PRN
Start: 2023-02-10 — End: 2023-02-10

## 2023-02-10 MED ORDER — SODIUM CHLORIDE 0.9 % IJ SOLN (CUSTOM)
3.0000 mL | Freq: Three times a day (TID) | INTRAMUSCULAR | Status: DC | PRN
Start: 2023-02-10 — End: 2023-02-14
  Administered 2023-02-13: 3 mL via INTRAVENOUS

## 2023-02-10 MED ORDER — LACTATED RINGERS IV SOLN
INTRAVENOUS | Status: AC
Start: 2023-02-10 — End: 2023-02-10

## 2023-02-10 MED ORDER — PROCHLORPERAZINE EDISYLATE 5 MG/ML IJ SOLN WRAPPED RECORD
10.0000 mg | Freq: Four times a day (QID) | INTRAMUSCULAR | Status: DC | PRN
Start: 2023-02-10 — End: 2023-02-14

## 2023-02-10 MED ORDER — HEPARIN SODIUM (PORCINE) 5000 UNIT/ML IJ SOLN
INTRAMUSCULAR | Status: DC | PRN
Start: 2023-02-10 — End: 2023-02-10
  Administered 2023-02-10: 5000 [IU] via SUBCUTANEOUS

## 2023-02-10 MED ORDER — FLUOXETINE HCL 20 MG OR CAPS
40.0000 mg | ORAL_CAPSULE | Freq: Every day | ORAL | Status: DC
Start: 2023-02-11 — End: 2023-02-14
  Administered 2023-02-11 – 2023-02-14 (×4): 40 mg via ORAL
  Filled 2023-02-10 (×4): qty 2

## 2023-02-10 MED ORDER — ONDANSETRON HCL 4 MG/2ML IV SOLN
INTRAMUSCULAR | Status: DC | PRN
Start: 2023-02-10 — End: 2023-02-10
  Administered 2023-02-10: 4 mg via INTRAVENOUS

## 2023-02-10 MED ORDER — LIDOCAINE HCL 2 % IJ SOLN WRAPPED RECORD
INTRAMUSCULAR | Status: DC | PRN
Start: 2023-02-10 — End: 2023-02-10
  Administered 2023-02-10: 40 mg via INTRATHECAL

## 2023-02-10 MED ORDER — LABETALOL HCL 5 MG/ML IV SOLN
INTRAVENOUS | Status: DC | PRN
Start: 2023-02-10 — End: 2023-02-10
  Administered 2023-02-10: 5 mg via INTRAVENOUS

## 2023-02-10 MED ORDER — SODIUM CHLORIDE 0.9 % IV SOLN
INTRAVENOUS | Status: DC | PRN
Start: 2023-02-10 — End: 2023-02-10

## 2023-02-10 MED ORDER — ACETAMINOPHEN 325 MG PO TABS
975.0000 mg | ORAL_TABLET | Freq: Once | ORAL | Status: AC
Start: 2023-02-10 — End: 2023-02-10
  Administered 2023-02-10: 975 mg via ORAL
  Filled 2023-02-10: qty 3

## 2023-02-10 MED ORDER — FENTANYL CITRATE (PF) 50 MCG/ML IJ SOLN (WRAPPED RECORD) ~~LOC~~
25.0000 ug | INTRAMUSCULAR | Status: AC | PRN
Start: 2023-02-10 — End: 2023-02-10

## 2023-02-10 MED ORDER — SUGAMMADEX SODIUM 200 MG/2ML IV SOLN
INTRAVENOUS | Status: AC
Start: 2023-02-10 — End: 2023-02-10
  Filled 2023-02-10: qty 2

## 2023-02-10 SURGICAL SUPPLY — 91 items
APPLICATOR CHLORAPREP 26ML, ~~LOC~~ (Prep Solutions) ×2 IMPLANT
APPLICATOR VISTASEAL LAPARASCOPIC DUAL 35CM RIDGID (Misc Surgical Supply) IMPLANT
APPLIER CLIP MEDIUM LIGACLIP MCA (Staplers and staple reloads) IMPLANT
APPLIER ENDOSCOPIC LIGACLIP 12-L 12MM W/20 TI LG CLIPS ER420 (Staplers and staple reloads) IMPLANT
APPLIER LIGACLIP MCA W/30 MEDIUM TI CLIPS (Staplers and staple reloads) ×2 IMPLANT
CARD SMART BLANK ICLASS/PROX CONTACTLESS W/PROX (Misc Medical Supply) ×2 IMPLANT
CAUTERY BOVIE ROCKER SWITCH PENCIL (Cautery) ×2 IMPLANT
CLEARIFY SYSTEM VISUALIZATION SCOPE WARMER D-HELP (Lap/Endo/Arthroscopy) ×4 IMPLANT
CLIP APPLIER LIGAMAX 5MM W/ 15 TI CLIPS (Staplers and staple reloads) IMPLANT
CLIP HEM-O-LOK LARGE (Staplers and staple reloads) IMPLANT
CLIP HEM-O-LOK MED/LGE (Staplers and staple reloads) IMPLANT
CLIPPER BLADE ASSY FOR 9670 CLIPPER (Misc Surgical Supply) ×2 IMPLANT
DERMABOND ADVANCE 0.7ML (Dressings/packing) ×4 IMPLANT
DRAIN PENROSE 1/2" X 18" STRL LTX (Lines/Drains) IMPLANT
DRAIN PENROSE 3/8" X 18" STRL LTX (Lines/Drains) ×2 IMPLANT
DRAPE 3M STERI-DRAPE 35" X 35" (Drapes/towels) IMPLANT
DRAPE ENDOSCOPY GENERAL 269CM X 314CM (Drapes/towels) ×2 IMPLANT
DRAPE LITHOTOMY LEGGING W/CUFF 31" X 48" (Drapes/towels) ×2 IMPLANT
DRAPE ORTHO BAR SHEET 100 X 60" (Drapes/towels) ×2 IMPLANT
DRESSING CONFORM 2" X 75" STRL (Dressings/packing) ×2 IMPLANT
DRESSING SPONGE CURITY 4X4 16 PLY (Dressings/packing) ×8 IMPLANT
ELECTRODE CLEANCOAT FLAT L-HOOK LAPAROSCOPIC 36CM (Cautery) IMPLANT
ENDOSTITCH SUTURING 10MM (Lap/Endo/Arthroscopy) IMPLANT
FILM IOBAN 2 ANTIMICROBIAL 23" X 17" (Drapes/towels) ×2 IMPLANT
GLOVE BIOGEL INDICATOR UNDERGLOVE SIZE 6.5 (Gloves/Gowns) ×2 IMPLANT
GLOVE BIOGEL INDICATOR UNDERGLOVE SIZE 8 (Gloves/Gowns) ×6 IMPLANT
GLOVE BIOGEL PI INDICATOR SIZE 7.5 (Gloves/Gowns) ×2 IMPLANT
GLOVE BIOGEL PI LF SIZE 8 INDICATOR (Gloves/Gowns) ×2 IMPLANT
GLOVE BIOGEL PI ULTRATOUCH SIZE 7.5 (Gloves/Gowns) ×14 IMPLANT
GLOVE BIOGEL PI ULTRATOUCH SIZE 8 (Gloves/Gowns) ×6 IMPLANT
GLOVE SURGEON BIOGEL SIZE 7 (Gloves/Gowns) ×8 IMPLANT
GLOVE SURGEON BIOGEL SIZE 7.5 (Gloves/Gowns) ×12 IMPLANT
GLOVE SURGICAL BIOGEL SIZE 6.5 (Gloves/Gowns) ×2 IMPLANT
GOWN MICRO COOL LG BLUE, AAMI LVL 4 (Gloves/Gowns) ×2 IMPLANT
GOWN SIRUS XLG BLUE, AAMI LVL 4 (Gloves/Gowns) ×2 IMPLANT
GOWN SURGICAL AERO CHROME LARGE (Gloves/Gowns) ×4 IMPLANT
GOWN SURGICAL ULTRA XL BLUE, AAMI LVL 3 (Gloves/Gowns) ×2 IMPLANT
KIT M-CLOSE LAP PORT CLOSURE SYSTEM (Lap/Endo/Arthroscopy) ×2 IMPLANT
LIGASURE BLUNT TIP 5MM X 37CM SEALER/DIVIDER - LF1837 (Lap/Endo/Arthroscopy) ×2 IMPLANT
LIGASURE SMALL JAW 18.8CM - LF1212A (Lap/Endo/Arthroscopy) ×2 IMPLANT
LUBRICANT 2OZ STERILE PACKAGE (Misc Medical Supply) ×4 IMPLANT
PAD ADHESIVE REMOVER (Patient Care Supply) ×16 IMPLANT
PAD GROUND VALLEYLAB REM ADULT E7507 (Cautery) ×2 IMPLANT
PREP TRAY WET SKIN SCRUB KENDALL (Prep Solutions) IMPLANT
PROTECTOR ULNAR NERVE PAD, YELLOW (Patient Care Supply) ×10 IMPLANT
RELOAD ENDOPATH ECHELON 60MM, BLUE (Staplers and staple reloads) ×6 IMPLANT
S ALEXIS LAP SYS 6/BX (Kits/Sets/Trays) ×2 IMPLANT
SEALANT FIBRIN VISTASEAL 10ML (Hemostatic agents/wax/sealants-absorbable) IMPLANT
SEALANT FIBRIN VISTASEAL 10ML (LOCATED IN FREEZER) (Hemostatic agents/wax/sealants-absorbable) IMPLANT
SET TUBING PNEUMOCLEAR SMOKE EVACUATION HIGH FLOW (Tubing/Suction) ×2 IMPLANT
SHEAR HARMONIC HD 1000I 36CM CURVED (Lap/Endo/Arthroscopy) ×2 IMPLANT
SKIN PREP -BATH CLOTH CHG 2% (Prep Solutions) ×2 IMPLANT
SLEEVE SCD KNEE MEDIUM (Patient Care Supply) ×2 IMPLANT
SOLUTION IRR POUR BTL 0.9% NS 1000ML (Non-Pharmacy Meds/Solutions) ×2 IMPLANT
SOLUTION IRR POUR BTL H20 1000ML (Non-Pharmacy Meds/Solutions) ×6 IMPLANT
SOLUTION IV 0.9% NS 1000ML (Non-Pharmacy Meds/Solutions) ×2 IMPLANT
SPONGE ENZYMATIC SPONGE CA/80 (Misc Medical Supply) ×4 IMPLANT
SPONGE LAP RF DETECT 18" X 18" XRAY STERILE (Sponges) ×8 IMPLANT
STAPLER ECHELON FLEX 60 POWERED ENDOSCOPIC PSEE60A (Staplers and staple reloads) IMPLANT
STAPLER ECHELON FLEX 60 POWERED PCE60A (Staplers and staple reloads) ×2 IMPLANT
STAPLER PROXIMATE SKIN 35 WIDE (Staplers and staple reloads) IMPLANT
STAPLER TLC75 LINEAR CUTTER 75 MM (Staplers and staple reloads) ×2 IMPLANT
STRIP MEDI-STRIP SKIN CLOSURE 1/2 X 4" (Dressings/packing) ×2 IMPLANT
SUCTION IRRIGATOR STRYKEFLOW2- SINGLE USE (Lap/Endo/Arthroscopy) ×2 IMPLANT
SURGICAL PACK BASIC MAJOR SET-UP (Procedure Packs/kits) ×2 IMPLANT
SURGICAL PACK LAPAROSCOPIC (Procedure Packs/kits) ×2 IMPLANT
SURGILUBE JELLY 2OZ- STERILE (Misc Surgical Supply) ×4 IMPLANT
SUTURE ENDO STITCH 2-0 SOFSILK 48" (Suture) IMPLANT
SUTURE ENDOLOOP PDS II 0 18" (EZ10) (Suture) IMPLANT
SUTURE MONOCRYL PLUS 4-0 27" PS-2 MCP426 (Suture) ×4 IMPLANT
SUTURE PDS II 0 27" UR-6 (D7185) (Suture) ×4 IMPLANT
SUTURE PDS PLUS 0 27" CT-2 (PDP334) (Suture) ×10 IMPLANT
SUTURE PDS PLUS 1 36" CT (PDP359) (Suture) IMPLANT
SUTURE PDS PLUS 1 36" CT-1 (Suture) ×4 IMPLANT
SUTURE PERMA-HAND SILK 3-0 18" SH C013D (Suture) ×2 IMPLANT
SUTURE V-LOC 180 3-0 6" V-20 L0604 (Suture) IMPLANT
SUTURE V-LOC PBT RELOAD 0 6" N006L (Suture) IMPLANT
SUTURE VICRYL PLUS 2-0 27" SH VCP417 (Suture) ×4 IMPLANT
SUTURE VICRYL PLUS 3-0 18" SH POPS (Suture) ×2 IMPLANT
SUTURE VICRYL PLUS 3-0 27" SH VCP316 (Suture) IMPLANT
SUTURE VICRYL PLUS 3-0 27" SH VCP416 (Suture) ×4 IMPLANT
SYRINGE 10CC LL CONTRL (Needles/punch/cannula/biopsy) IMPLANT
TOWELS OR BLUE 4-PACK STERILE, DISPOSABLE (Drapes/towels) ×4 IMPLANT
TRAY FOLEY SURESTEP LUBRI-SIL I.C.16FR URIMETER, LF (Lines/Drains) ×2 IMPLANT
TROCAR ENDOPATH XCEL BLADELESS OPTIVIEW STABILITY SLEEVE 12MM X L100 MM (Lap/Endo/Arthroscopy) ×2 IMPLANT
TROCAR ENDOPATH XCEL BLADELESS OPTIVIEW STABILITY SLEEVE 5MM X L100 MM (Lap/Endo/Arthroscopy) ×2 IMPLANT
TROCAR ENDOPATH XCEL STABILITY SLEEVE 12MM X L100 MM UNIVERSAL (Lap/Endo/Arthroscopy) ×2 IMPLANT
TROCAR ENDOPATH XCEL STABILITY SLEEVE 5MM X L100 MM, UNIVERSAL (Lap/Endo/Arthroscopy) ×4 IMPLANT
TUBING IRRIGATION ENDOGATOR (Tubing/Suction) ×2 IMPLANT
TUBING SUCTION MEDI-VAC 9/32" X 20' (Tubing/Suction) ×8 IMPLANT
VALVE CONNECTOR DEFENDO, SINGLE USE (Misc Surgical Supply) ×2 IMPLANT

## 2023-02-10 NOTE — Plan of Care (Signed)
Problem: Promotion of Perioperative Health and Safety  Goal: Promotion of Health and Safety of the Perioperative Patient  Description: The patient remains safe, receives treatment appropriate to the surgical intervention and patient's physiological needs and is discharged or transferred to the appropriate level of care.    Information below is the current care plan.  02/10/2023 1330 by Dina Rich, RN  Outcome: Progressing  Flowsheets  Taken 02/10/2023 1330  Guidelines: PACU  Outcome Evaluation (rationale for progressing/not progessing) every shift: Patient meets PACU discharge criteria. Vital signs stable and pain managed. Foley catheter remains in place. Patient awaiting hospital admission.  Taken 02/10/2023 1132  Individualized Interventions/Recommendations #1: Pain managment in recovery.  Individualized Interventions/Recommendations #2 (if applicable): Maintain stable vital signs while in PACU.  Individualized Interventions/Recommendations #3 (if applicable): Wean oxygen as tolerated and encourage deep breathing..  Individualized Interventions/Recommendations #4 (if applicable):   Monitor blood glucose while in recovery. MD aware of BG 257   Plan to recheck in .  02/10/2023 1132 by Dina Rich, RN  Outcome: Progressing  Flowsheets (Taken 02/10/2023 1132)  Individualized Interventions/Recommendations #1: Pain managment in recovery.  Individualized Interventions/Recommendations #2 (if applicable): Maintain stable vital signs while in PACU.  Individualized Interventions/Recommendations #3 (if applicable): Wean oxygen as tolerated and encourage deep breathing..  Individualized Interventions/Recommendations #4 (if applicable):   Monitor blood glucose while in recovery. MD aware of BG 257   Plan to recheck in .

## 2023-02-10 NOTE — Brief Op Note (Signed)
BRIEF OPERATIVE NOTE     DATE: 02/10/2023  TIME: 11:30 AM    PREOPERATIVE DIAGNOSIS: Diverting Loop Ileostomy, hiatal hernia     POSTOPERATIVE DIAGNOSIS: Diverting Loop Ileostomy, hiatal hernia    PROCEDURE, WOUND CLASSIFICATION & WOUND CLOSURE:   Procedure(s):  Laparoscopic hiatal hernia repair, gastric fundoplication, intraoperative upper endoscopy, possible mesh placement - Wound Class: Class II (Clean Contaminated) - Incision Closure: Deep and Superficial Layers  COMPLEX ILEOSTOMY, TAKE-DOWN - Wound Class: Class III (Contaminated) - Incision Closure: Deep and Superficial Layers     SURGEONS:  Panel 1:   Primary: Dorian Furnace, MD  Resident - Assisting: Arelia Longest, MD    Panel 2:   Primary: Donnalee Curry, MD  Fellow: Kendra Opitz, Darien Ramus, MD   Resident - Assisting: Arelia Longest, MD    ANESTHESIA: General    FINDINGS:   Panel 1:   Takedown of diverting loop ileostomy, ~ 4 cm small bowel resected, side to side stapled anastomosis.     Panel 2:   Hiatal hernia reduced and repaired with crural closure, Nissen fundoplication    SPECIMENS:  ID Type Source Tests Collected by Time Destination   A : Samll bowel resection with ileostomy Biopsy Small bowel PATHOLOGY TISSUE EXAM Donnalee Curry, MD 02/10/2023 [phone]        Fluids/Blood Products:    IV Fluids: 1000    Blood Products: none    EBL: 15 mL    Urine Output: 180 mL    COMPLICATIONS: none    DISPOSITION: PACU to floor

## 2023-02-10 NOTE — H&P (Signed)
General Surgery Surgery History and Physical        History of present illness:  Tiffany Chen is a 78 year old female with history of GERD and diverticulitis s/p end colostomy creation in 03/05/2007, presenting for scheduled gastric fundoplication and ostomy takedown    PMH:  Past Medical History:   Diagnosis Date    Acquired tracheobronchomegaly with bronchiectasis (CMS-HCC) 05/22/2020    Age-related nuclear cataract of left eye 10/15/2021    Cavitary lesion of lung 07/24/2020    Chronic obstructive airway disease (CMS-HCC) Mar 04, 2016    MAC    Combined forms of age-related cataract of both eyes 10/15/2021    Depression     DM (diabetes mellitus) (CMS-HCC)     Gastroesophageal reflux disease, unspecified whether esophagitis present 10/12/2020    Added automatically from request for surgery [id number]    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       PSH:  Past Surgical History:   Procedure Laterality Date    BRONCHOSCOPY  12/2020    BRAIN SURGERY  12, 03/05/19    CESAREAN SECTION, LOW TRANSVERSE      COLON SURGERY  Mar 05, 2007    COLOSTOMY      CRANIOTOMY      LUNG LOBECTOMY         SH:  Socioeconomic History    Marital status: Widowed   Tobacco Use    Smoking status: Never    Smokeless tobacco: Never   Vaping Use    Vaping status: Never Used   Substance and Sexual Activity    Alcohol use: Yes     Comment: glass of wine every other month    Drug use: Not Currently    Sexual activity: Not Currently   Social Activities of Daily Living Present    Military Service No     Blood Transfusions No    Caffeine Concern No    Occupational Exposure No    Hobby Hazards Yes     Comment: Gardening, MAC    Sleep Concern No    Stress Concern Yes     Comment: Husband death Cov8, Mar 04, 2018, sold home, rehomed pets. [place]    Weight Concern No    Special Diet Yes    Back Care No    Exercises Regularly Yes    Bike Helmet Use No    Seat Belt Use No    Performs Self-Exams No   Social History Narrative    Lives with brother Fayrene Fearing at home. Sister also lives in [place]       FH:  Family History   Problem Relation Name Age of Onset    Cancer Mother Denton Lank ngs and throat    Heart Disease Mother Joya Martyr         Congrestive heart failure due to cancer    Hypertension Mother Joya Martyr     Psychiatry Mother Joya Martyr         Bi polar, manic depressive    Cancer Father Jamal Maes  Prostate    Diabetes Father Jamal Maes     Stroke Father Jamal Maes     Stroke M Grandmother Winifred Larita Fife     Cancer Brother Vallarie Mare         Skin (many locations)    Diabetes Brother Vallarie Mare        Medications:   lactated ringers   IntraVENOUS Continuous Donnalee Curry, MD   Stopped at 02/10/23 0700    lidocaine  0.1 mL IntraDERMAL Once PRN Donnalee Curry, MD           Allergies:  Allergies   Allergen Reactions    Walnuts Swelling     Tongue swollen and burn       Review of Systems  Negative except as noted in HPI    Exam:   BP 135/61 (BP Location: Right arm)   Pulse 80   Temp 97.4 F (36.3 C)   Resp 17   Ht 5\' 3"  (1.6 m)   Wt 68.3 kg (150 lb 9.6 oz)   SpO2 97%   BMI 26.68 kg/m   NAD, awake, alert/oriented   Regular rate and rhythm  Non-labored breathing on room air  Abdomen soft, non-distended, non-tender  Extremities warm, well-perfused  Skin warm and dry    Labs:  Lab Results   Component Value Date    WBC 9.8 11/20/2022    HGB 13.2 11/20/2022    HCT 39.1 11/20/2022    PLT 215 11/20/2022     No results found for: "INR", "PTT"  Lab Results   Component Value Date    NA 139  11/20/2022    K 4.6 11/20/2022    CL 105 11/20/2022    BICARB 22 11/20/2022    BUN 14 11/20/2022    CREAT 0.89 11/20/2022    GLU 132 (H) 11/20/2022    [place] 9.5 11/20/2022    MG 1.9 04/03/2022    PHOS 3.7 04/03/2022       Imaging:  No results found.        Assessment & Plan:  Tiffany Chen is a 78 year old female with history of GERD and diverticulitis s/p end colostomy creation in 2009, presenting for scheduled gastric fundoplication and ostomy takedown    - Proceed to scheduled procedure  - Consent signed        Discussed with attending Dr. Orlin Hilding    --

## 2023-02-10 NOTE — Op Note (Signed)
02/10/2023    PREOPERATIVE DIAGNOSIS: hiatal hernia, history of MAC infection, GERD, diverting loop ileostomy    POSTOPERATIVE DIAGNOSIS: same    PROCEDURE PERFORMED (panel 1):  Laparoscopic repair of hiatal hernia  Laparoscopic Nissen Fundoplication  Upper gastrointestinal endoscopy.    Panel 2:  Diverting loop ileostomy takedown and abdominal lysis of adhesions Tonita Phoenix)    SURGEON/STAFF:  Jerolyn Center, MD  FELLOW: Felipa Furnace  RESIDENT: [place]ulu     INDICATION FOR SURGERY: Tiffany Chen is a 78 year old female with longstanding MAC infections with severer GERD on objective studies. She also had history of colostomy and large parastomal hernia takedown with Dr. Tonita Phoenix and myself last year. After significant recovery time, she now returns for diverting ostomy takedown and nissen fundoplication. Benefits, risks, and alternatives to surgery were explained in detail to patient and consents were signed without duress.    PROCEDURE IN DETAIL:   The patient was brought to the operating room and placed on the OR table. General anesthesia was induced without difficulty. The patient was positioned with arms out and legs open. All extremities were secured and padded. Antibiotic prophylaxis was administered prior to incision. Pneumatic compression devices were on and functional for the duration of the case. The abdomen was prepped and draped in usual sterile fashion.     Dr. Tonita Phoenix peformed his portion of the case.    I was then called to the room to place ports and assist with his laparoscopic lysis of adhesions. Omental adhesions to the abdominal wall were taken down sharply and with ligasure. This freed up the abdominal cavity to perform the hiatal hernia repair.    Pneumoperitoneum was induced through the ostomy site takedown. Three more trocars were then placed in the usual position for access to the left upper quadrant, 0ne 12 mm, and 2 five mm. There were adhesions of the omentum to the prior midline, these  were taken down with ligasure and scissors.    The left lobe of the liver was then retracted anteriorly using the [facility] retractor.  The hiatus was exposed. A hiatal hernia was identified. The hernia measured 4cm and the upper portion of the stomach was in the chest. The short gastric vessels were all carefully divided using the harmonic scalpel without problems. The peritoneum overlying the left crus of the diaphragm was then divided.  The phrenogastric ligament was divided.  The fundus of the stomach was then carefully mobilized from the mediastinum by removing hernia sac from the mediastinum,.    The hernia sac was divided and dissected from the mediastinum, care was taken to identify and avoid damage to the esophagus and the posterior vagus nerve.     The peritoneum was then opened in the anterior aspect of the right crus after the pars flaccida was divided.  The peritoneum was then opened on the right crus and extended downwards. The esophagus was then clearly dissected from all adjacent structures and both vagus nerves were included in the dissection and left with the esophagus.  A Penrose drain was then placed around the esophagus at the esophageal junction including the vagus nerve.  The esophagus was dissected and mobilized. A posterior lipoma was reduced back into the abdomen. Happy with 3cm of intraabdominal esophagus, the esophageal junction was pulled upwards. One 0 non absorbable v-loc suture was then placed to close the posterior crura using the endo stitch. The 9 f Bougie was use to tailor the crura closure.     A floppy  Nissen fundoplication was then performed over a 52 F bougie, three 2-0 silk stitches were used to fix the wrap; and the middle stitch was used to attach the wrap to the esophagus. Two more stitches were used to tack the wrap to the esophagus and crura, to the right, and left side of wrap.     An upper GI endoscopy was performed, showing good opening of the GE junction, and a  symmetric wrap.     The patient's trocars were then removed. The ostomy site was closed with 0 PDS figure-of-eight sutures after clearing anterior fascia. The abdomen was reinsufflated after closing this to ensure adequate repair.  The pneumoperitoneum was evacuated.  The wounds were closed in layers. Scarpa's at the prior ostomy site was purse-string closed and packed with gauze (skin left open). The patient tolerated the procedure well and was taken to the Post-Anesthesia Care Unit after extubation.

## 2023-02-10 NOTE — Anesthesia Postprocedure Evaluation (Signed)
Anesthesia Post Note    Patient: Tiffany Chen    Procedure(s) Performed: Procedure(s):  Laparoscopic hiatal hernia repair, gastric fundoplication, intraoperative upper endoscopy, possible mesh placement  COMPLEX ILEOSTOMY, TAKE-DOWN      Final anesthesia type: General    Patient location: PACU    Post anesthesia pain: adequate analgesia    Mental status: awake, alert , and oriented    Airway Patent: Yes    Last Vitals:   Vitals Value Taken Time   BP 141/69 02/10/23 1215   Temp 36.2 C 02/10/23 1200   Pulse 71 02/10/23 1229   Resp 17 02/10/23 1229   SpO2 95 % 02/10/23 1229   Vitals shown include unfiled device data.     Post vital signs: stable    Hydration: adequate    N/V:no    Anesthetic complications: no    Plan of care per primary team.

## 2023-02-10 NOTE — Plan of Care (Signed)
Problem: Promotion of Perioperative Health and Safety  Goal: Promotion of Health and Safety of the Perioperative Patient  Description: The patient remains safe, receives treatment appropriate to the surgical intervention and patient's physiological needs and is discharged or transferred to the appropriate level of care.    Information below is the current care plan.  Outcome: Progressing  Flowsheets (Taken 02/10/2023 1132)  Individualized Interventions/Recommendations #1: Pain managment in recovery.  Individualized Interventions/Recommendations #2 (if applicable): Maintain stable vital signs while in PACU.  Individualized Interventions/Recommendations #3 (if applicable): Wean oxygen as tolerated and encourage deep breathing..  Individualized Interventions/Recommendations #4 (if applicable):   Monitor blood glucose while in recovery. MD aware of BG 257   Plan to recheck in .

## 2023-02-11 LAB — CBC WITH DIFF, BLOOD
ANC-Automated: 7.4 10*3/uL — ABNORMAL HIGH (ref 1.6–7.0)
Abs Basophils: 0 10*3/uL (ref <0.2)
Abs Eosinophils: 0 10*3/uL (ref 0.0–0.5)
Abs Lymphs: 0.9 10*3/uL (ref 0.8–3.1)
Abs Monos: 0.9 10*3/uL — ABNORMAL HIGH (ref 0.2–0.8)
Basophils: 0.3 %
Eosinophils: 0 %
Hct: 34 % (ref 34.0–45.0)
Hgb: 11.4 g/dL (ref 11.2–15.7)
Imm Gran %: 0.2 % (ref <1)
Imm Gran Abs: 0 10*3/uL (ref <0.1)
Lymphocytes: 9.9 %
MCH: 30.5 pg (ref 26.0–32.0)
MCHC: 33.5 g/dL (ref 32.0–36.0)
MCV: 90.9 um3 (ref 79.0–95.0)
MPV: 10.5 fL (ref 9.4–12.4)
Monocytes: 9.4 %
Plt Count: 200 10*3/uL (ref 140–370)
RBC: 3.74 10*6/uL — ABNORMAL LOW (ref 3.90–5.20)
RDW: 13.3 % (ref 12.0–14.0)
Segs: 80.2 %
WBC: 9.2 10*3/uL (ref 4.0–10.0)

## 2023-02-11 LAB — COMPREHENSIVE METABOLIC PANEL, BLOOD
ALT (SGPT): 57 U/L — ABNORMAL HIGH (ref 0–33)
AST (SGOT): 64 U/L — ABNORMAL HIGH (ref 0–32)
Albumin: 3.4 g/dL — ABNORMAL LOW (ref 3.5–5.2)
Alkaline Phos: 61 U/L (ref 40–130)
Anion Gap: 12 mmol/L (ref 7–15)
BUN: 18 mg/dL (ref 8–23)
Bicarbonate: 21 mmol/L — ABNORMAL LOW (ref 22–29)
Bilirubin, Tot: 0.46 mg/dL (ref <1.2)
Calcium: 8.8 mg/dL (ref 8.5–10.6)
Chloride: 103 mmol/L (ref 98–107)
Creatinine: 0.8 mg/dL (ref 0.51–0.95)
Glucose: 167 mg/dL — ABNORMAL HIGH (ref 70–99)
Potassium: 4 mmol/L (ref 3.5–5.1)
Sodium: 136 mmol/L (ref 136–145)
Total Protein: 6.3 g/dL (ref 6.0–8.0)
eGFR Based on CKD-EPI 2021 Equation: >60 mL/min/{1.73_m2}

## 2023-02-11 LAB — PHOSPHORUS, BLOOD: Phosphorous: 3.6 mg/dL (ref 2.7–4.5)

## 2023-02-11 LAB — MAGNESIUM, BLOOD: Magnesium: 1.8 mg/dL (ref 1.6–2.4)

## 2023-02-11 MED ORDER — LACTATED RINGERS IV SOLN
INTRAVENOUS | Status: DC
Start: 2023-02-11 — End: 2023-02-11

## 2023-02-11 MED ORDER — ENOXAPARIN SODIUM 40 MG/0.4ML IJ SOSY
40.0000 mg | PREFILLED_SYRINGE | Freq: Every day | INTRAMUSCULAR | Status: DC
Start: 2023-02-11 — End: 2023-02-14
  Administered 2023-02-11 – 2023-02-14 (×4): 40 mg via SUBCUTANEOUS
  Filled 2023-02-11 (×4): qty 1

## 2023-02-11 MED ORDER — LACTATED RINGERS IV SOLN
Freq: Once | INTRAVENOUS | Status: AC
Start: 2023-02-11 — End: 2023-02-11

## 2023-02-11 NOTE — Progress Notes (Signed)
Minimally Invasive Surgery Progress Note    Patient Name: Tiffany Chen  MRN: [id number]    Hospital Day:   1 day - Admitted on: 02/10/2023  Post Operative Day(s): 1 Day Post-Op     HPI: Tiffany Chen is a 78 year old female with history of COPD, GERD and diverticulitis s/p ileostomy creation in 2009, presenting for scheduled gastric fundoplication and ostomy takedown     Events/Subjective:  - VSS, NAEON  - Feels well this AM, pain well controlled  - Thinks she may have passed gas overnight  - No nausea  - No GERD or reflux symptoms    Vitals signs:  Latest entry:   Temperature: 98.4 F (36.9 C)  Heart Rate: 90  Respirations: 18  Blood pressure (BP): 133/65  SpO2: 95 %  Weight: 72.6 kg (160 lb 0.9 oz)  Percentage Weight Change (%): 6.28 %    Temperature:  [98.4 F (36.9 C)-98.7 F (37.1 C)] 98.4 F (36.9 C) (02/04 1903)  Blood pressure (BP): (129-145)/(64-70) 133/65 (02/04 1903)  Heart Rate:  [73-90] 90 (02/04 1903)  Respirations:  [16-18] 18 (02/04 1903)  Pain Score: 4 (02/04 1903)  O2 Device: None (Room air) (02/04 1056)  O2 Flow Rate (L/min):  [2 l/min] 2 l/min (02/04 0708)  SpO2:  [95 %-97 %] 95 % (02/04 1903)    Intake/Output                         02/10/23 0600 - 02/11/23 0559 02/11/23 0600 - 02/12/23 0559     [id number] 1800-0559 Total 8416-6063 1800-0559 Total                 Intake    P.O.  --  -- --  480  -- 480    I.V.  1000  -- 1000  --  -- --    Total Intake 1000 -- 1000 480 -- 480       Output    Urine  440  750 1190  50  100 150    Total Output [phone] 50 100 150       Net I/O     560 -750 -190 430 -100 330            Patient Lines/Drains/Airways Status       Active PICC Line / CVC Line / PIV Line / Drain / Airway / Intraosseous Line / Epidural Line / ART Line / Line Type / Wound / Pressure Ulcer Injury       Name Placement date Placement time Site Days    Peripheral IV - 22 G Left Hand 02/10/23  0641  Hand  1    Incision -  02/10/23 1046 Abdomen Lower;Right 02/10/23  1046  -- 1     Incision -  02/10/23 1046 Abdomen 02/10/23  1046  -- 1                    Diet Diet Clear Liquid; Zero Carbohydrate Clear Liquid    No data recorded     Labs:  CBC  Recent Labs     02/11/23  0646   WBC 9.2   HGB 11.4   HCT 34.0   PLT 200   SEG 80.2   LYMPHS 9.9   MONOS 9.4       Chemistry  Recent Labs     02/11/23  0646   NA 136   K  4.0   CL 103   BICARB 21*   BUN 18   CREAT 0.80   GLU 167*   [place] 8.8   MG 1.8   PHOS 3.6       Recent Labs     02/11/23  0646   ALK 61   AST 64*   ALT 57*   TBILI 0.46   ALB 3.4*       Coags  No results for input(s): "PT", "PTT", "INR" in the last 72 hours.    Radiology:   No results found.     Physical Exam:  General Appearance: alert, responsive, cooperative   Heart:  RR, WWP   Lungs: non-labored breathing on 2L NC  Abdomen: Soft, non-distended, appropriately tender, incisions c/d/I. No rebound or guarding.   Extremities:  no edema, wwp     Medications:  IV Meds      Scheduled Meds   acetaminophen  650 mg Q4H    enoxaparin  40 mg Daily    fluoxetine  40 mg Daily    gabapentin  300 mg Q8H    lansoprazole  30 mg QAM AC    tiotropium-olodaterol  2 puff Daily     PRN Meds   albuterol  2 puff Q6H PRN    ondansetron  4 mg Q6H PRN    prochlorperazine  10 mg Q6H PRN    sodium chloride  3 mL Q8H PRN         ASSESSMENT / PLAN:  Tiffany Chen is a 78 year old female with history of COPD, GERD and diverticulitis s/p ileostomy creation in 2009, now s/p gastric fundoplication, ostomy takedown     - Recommend liquid medications or small pills (less than tip of pencil eraser)  - Recommend full liquid diet once appropriate from colorectal surgery perspective  - Plan for discharge on two weeks of full liquids, followed by two weeks of soft diet.  - Remainder of care per colorectal team    The patient was examined with the team and discussed with attending, Dr. Orlin Hilding, who agrees with the above assessment and plan.    Arelia Longest, MD, MPH  General Surgery Resident

## 2023-02-11 NOTE — Interdisciplinary (Signed)
PT Contact       Row Name 02/11/23 1319       Therapy Contact Note    Physical Therapy Screening Assessment Expect current deficits will resolve without skilled therapy intervention. Recommend mobilization with nursing. No skilled Physical Therapy intervention needed at this time.    Additional Comments Patient observed ambulating in hall without need for assistive devices with no overt balance deficits. Patient also observed up ad lib in room managing ADLs with CCP assist needed only for set up of ADL items. No acute PT needs this admission. Rehab services to sign off at this time. Should status change or new needs arise, please reconsult.

## 2023-02-11 NOTE — Discharge Instructions (Addendum)
Laparoscopic Esophageal Surgery     Post-Operative Instructions   Please read over this handout carefully. While instructions may vary from patient to patient, this handout should give you a general idea of things to do to help you get well after your surgery.     Activity   You will likely feel tired for at least 1 week after your surgery. Take your pain medicine as needed in order to stay active, but rest as needed for recovery. Take short walks 2 to 3 times a day. This will help reduce the risk of blood clots after surgery. You may use the stairs as needed as long as you are not dizzy or weak. Make sure someone is around the first few times you use the stairs or exercise.     Driving   Do not drive until you have been seen for your first clinic office visit after surgery. Unless told otherwise by [place], you may drive after your first follow-up visit and when you can react safely in an emergency. You will be sent home with non-narcotic pain medication. Take as prescried    Lifting/Coughing   Practice 10 deep breaths and 2 coughs every hour for at least 12 hours a day while awake for the first week after surgery. This will decrease your risk of lung problems or pneumonia. Do not lift heavy objects (more than 8 pounds) for the first 6 weeks. Also avoid pushing, pulling or laying on your stomach for these first 6 weeks. When coughing, be sure to place a pillow over the incisions and gently press inward to reduce pressure.     Medications   Use your pain medicine as prescribed. Pain medication may cause nausea on an empty stomach so it is recommended you take it with food. For the first 6 weeks after surgery, all medications should be smaller than the size of a pencil eraser. If medications are larger, they should be crushed or in liquid form. If you had the POEM (Per Oral Endoscopic Myotomy) procedure, all medications will need to be crushed or in liquid form for 6 weeks after surgery. Please talk to your prescribing  provider to see if medications should be changed after surgery.   If you feel constipated and have not had a bowel movement by the 4th day after surgery, you may take one ounce of Milk of Magnesia.     Incisions   Your incisions have been closed with dissolvable suture on the inside and a special skin glue over the incision. The skin glue will dissolve so do not attempt to remove it from your skin. You may shower the day after surgery and allow clean, soapy water to run over your incision but do not expose your incisions to soaking in water (i.e. hot tub, bathtub or swimming pools) for the first 6 weeks after surgery. Do not put any ointment or creams over the incisions for the first 6 weeks after surgery or while the incision is open, draining or scabbed.     Diet   You will be on a special diet for the first 6 weeks following your surgery in order to reduce complications. These nutritional guidelines have been designed to eliminate foods and beverages that might either impair swallowing or irritate the esophagus following surgery. Your diet will change temporarily for these first 6 weeks following surgery. Once your body has had time to heal, you can slowly resume to a normal, healthy diet on week 7. Please be aware  that everyone's progress and tolerance for food is different. This diet will provide you with enough calories and protein to help you heal and return to your normal lifestyle as soon as possible.     General Guidelines:   1. Eat 4 to 6 SMALL meals a day.   2. Foods should be prepared so that they are moist, soft, and easily swallowed.   3. Eat slowly, take small bites, and chew thoroughly.   4. Drink plenty of liquids with meals.Try alternating bites of food with sips of fluids.   5. Sit in an upright position for 2 hours after eating or drinking. DO NOT RECLINE!   6. Avoid smoking or chewing tobacco, citrus products, mint flavoring, strong/ potent spices, tomato products, fatty/fried foods, and  carbonated drinks for the first 6 weeks after surgery.   7. Avoid alcohol for the first 2 weeks after surgery.   8. NOT chew gum or drink through straws.   9. Avoid all food products that can create stomach gas: dried beans, peas, lentils, onions, broccoli, cauliflower, and any food from the cabbage family.   10. NO bread or meat products until week 7.   11. Take one chewable multivitamin daily.   12. If you are having trouble taking in adequate amount of calories and protein, you may need to drink a nutritional supplement.  Examples: Premier Protein, Pure Protein, Muscle Milk Light, Carnation Instant Breakfast, Ensure, Boost, Glucerna (for people with Diabetes).   13. For esophagectomy patients, you will begin clear liquid diet typically within 48 hrs. after surgery. If tolerated you will begin a full liquid diet on post-op day 4 or 5. Your surgeon will determine when you will begin the diet.     FULL LIQUID DIET  Foods to Choose  Foods to Avoid    2 Weeks     Water, coffee, tea    Non citrus beverages    Popsicles    Jell-O    Milk    Nutritional Supplements/Protein Shakes    Cream of Wheat    Cream and Broth based soups     (strained of chunky ingredients)    Yogurt (no strawberry, raspberry or blackberry because of seeds)    Pudding    Ice cream    Sherbet    Protein shakes       Citrus beverages: [place], lemon, lime, grapefruit    Carbonated beverages    Alcohol (first 2 weeks)      After you have completed 2 weeks of the full liquid diet you may move onto 2 weeks of the soft diet as long as you are tolerating the diet well.      SOFT DIET   2 Weeks  Foods to [employer] to [employer], coffee, tea    Non-citrus beverages (apple, cranberry, grape)       Citrus beverages: [place], lemon, lime, grapefruit    Carbonated beverages      Milk and Milk Products     Milk    Yogurt    Ice Cream/Sherbet    Pudding    Soft cheeses such as cream cheese, ricotta, cottage cheese, parmesan and finely  shredded cheeses       Yogurt or ice cream that contains fruit, seeds or nuts    Hard cheeses (cubes or slices such as: American, Cheddar, Jack, Mozzarella, Swiss)        SOFT DIET   2  Weeks  Foods to [employer] to Avoid    Cereal     Cooked cereal (oatmeal, Malt-O-Meal, grits and cream of wheat)    Any cereal softened with milk, except those in the avoid column       Bran cereals    Shredded wheat    Granola    Any cereal with fresh or dried fruit, coconut, seeds or nuts      Fruit and Nuts     Any canned, cooked fruits (without seeds, membranes or tough skins)    Fresh ripe banana, pear, peeled peach, nectarine, apricot       all fresh and dried fruits with seeds or skins (apples, grapes, berries) and dried fruit (especially prunes, dates, figs and raisins)    Unripe bananas (green)    All citrus (acidic) fruits (oranges, tangerines, lemon, limes, grapefruit and pineapple    Nuts    Peanut butter      Vegetables     Canned or cooked vegetables without seeds or skins: green beans, green peas, artichoke, asparagus, carrots, eggplant, spinach, squash & mushrooms       Raw vegetables    Tomatoes and tomato products    Gas-producing vegetables such as bell pepper, broccoli, brussel sprouts, cabbage, cauliflower, cucumber, dried beans, lentils, onions, radishes      Starchy Foods     Mashed potatoes    Baked/sweet potato (w/o skin)    Soft, moist rice    Noodles    Macaroni    Spaghetti    Pasta       All breads - bagels, muffins, rolls, biscuits, cornbread, dressing/stuffing (until week 7)    Pancakes    Waffles    Potato chips    Crackers        SOFT DIET   2 Weeks  Foods to [employer] to Scientist, research (physical sciences) Substitute     Eggs    Tofu       Beef, pork, chicken, [place]     (not until week 7)    Fats   (Eat in moderation)     Butter    Margarine    Mayonnaise    Vegetable Oil    Mildly seasoned salad dressings    Cream: coffee, sour, whipping    Cream cheese    Gravy       Highly seasoned salad dressing     [place] foods      Sweets     Sherbet    Gelatin    Puddings    Ice cream    Frozen yogurt    Popsicles    Custard       Desserts containing coconut, nuts, seeds, fresh or dried fruit, peppermint or spearmint    Jam/Marmalade with seeds      Miscellaneous     [place], Olives    Black pepper, chili powder, ketchup, barbecue sauce, horseradish, onion and garlic seasoning    Highly seasoned foods, herbs and spices        After you've completed 2 weeks of full liquids and 2 weeks of soft foods you may add fish to your diet for the next 2 weeks. Any canned, frozen or fresh fish without bones is acceptable.   Once you've completed 6 weeks of a progressive diet you may now add all breads and meats back into your diet.   If you have any questions regarding nutritional guidelines,  please contact   Eilleen Kempf, Registered Dietitian, at [phone] or email [email or web address]     Follow Up   You should follow up in the clinic 1 week, 6 weeks 3 months after your surgery. You may contact our office with any questions about your schedule at [phone], Monday through Friday, 8:00-4:30 pm. Please note, we do not use  for patient communication, nor email for urgent post-operative surgical issues. Please call the office with any questions or concerns or contact the Laurin Coder at [phone] to have the surgeon on call for your surgeon paged.   In addition, for patients with a known history of Barrett's Esophagus, please schedule an appointment to follow up 6 months after your surgery. You should have an endoscopy with your surgeon prior to this visit. For patients with no history of Barrett's Esophagus or cancer, please schedule an appointment to follow up 12 months after your surgery. You should have a barium swallow prior to this visit. Please contact our office for assistance in scheduling any tests prior to your visit.     When Should I call the doctor?   Severe, persistent vomiting: You may  experience vomiting if you eat too fast or too much. However, if you have persistent vomiting, or vomiting that looks black, bloody or like coffee grounds, this could a sign of the start of an ulcer.   Diarrhea: Occasional loose bowel movements are not uncommon. However, constant watery diarrhea, especially with fever, can be a sign of an infection of the bowels.   Fevers with or without cough: This could be a sign of lung, wound or stomach infection.   Increased heart rate: If your heart rate is more than 100 beats per minute, this could be a sign of infection.   Sudden shortness of breath and/or chest pain: This could be related to a heart problem, such as a heart attack, or could be related to a blood clot to the lung (pulmonary embolus) or a lung infection.   Leg swelling and pain: Swelling and pain in the calf (back of your lower leg) could mean a blood clot formation in the leg, particularly if it is only on one side.   Passing out: This could be a sign of low blood pressure, which could be due to blood loss, low blood sugar or other causes.   Sudden new stomach pain: This could be a sign of leakage around your stomach or an infection in your stomach.   Wound drainage: Gold colored drainage from your incision is normal; however, call if the drainage is green, brown, has a bad odor, or becomes red and irritated.       Contact Information   Please note: do not email staff after hours or on weekends for urgent matters. Please contact the [facility] operator at [phone] and ask to have your surgeon paged. If it is an emergency, please dial 911 or go to the emergency department [facility].     [facility]   Office: [phone]   Danise Edge- Bariatric Coordinator   E-mail: [email or web address]   Office: [phone]     Eilleen Kempf - Bariatric Dietitian - diet/exercise questions   Email: [email or web address]   Office: [phone]     Dr. Vela Prose- medical  questions or contact Medical Assistant- Pam Drown   Office: [phone]     Surgery Schedulers: Lafonda Mosses (Surgery [place]) [phone]; Gabriel Rainwater (Surgery Scheduler-[place]) [phone]; Marily Memos (surgery Scheduler  [place]) [phone]    Nurse Practitioners: Annamaria Helling NP (Dr Melvyn Novas & Dr Orlin Hilding) Desk [phone] Email: [email or web address]   Sheppard Penton NP (Dr Brooke Pace & Dr Cheron Schaumann)  Email: [email or web address]           Diagnosis and Reason for Admission    You were admitted to the hospital for the following reason(s):  ileostomy takedown, hiatal hernia repair and nissen fundoplication    Your full diagnosis list is located on this After Visit Summary in the Hospital Problems section.    What Happened During Your Hospital Stay    The main tests and treatments done for you during this hospitalization were:    Surgery  Routine Labs      The following evaluation is still important to complete after discharge from the hospital:  Follow up with Dr. Orlin Hilding in clinic on 2/21  Follow up with Dr. Tonita Phoenix in clinic TBD  Follow DIET per Dr. Dalbert Garnet team  Continue local wound care for ostomy site    Future Appointments   Date Time Provider Department Center   02/28/2023 11:10 AM [place], Lewanda Rife, MD [facility]   03/03/2023  9:00 AM [facility] INFUSION, 9TH FLOOR [facility] Onc Chem [place]   04/16/2023 11:30 AM [name], MD MOS Pulm Gen MOS   05/27/2023 11:00 AM Fredric Mare, DO [facility] Int Med [facility]   06/09/2023  2:00 PM Darliss Ridgel, Au[facility]   07/30/2023  3:00 PM Selena Batten Sharman Crate, MD U[facility] Allergy [facility]       Your diet at home should be:    Per esophagus surgeon instructions!    Your activity level at home should be:    Limited activity for 6 weeks, as specified below. However, walking is encouraged, outside and around the block, if you are able.    Specific activity restrictions:    Do not drive while taking narcotic pain  medications.  Do not work with heavy or complex machinery while taking narcotic pain medications.  Do not lift more than 10 pounds for a period of 6 weeks.    Wound care for your prior ostomy site:  You do not need to pack gauze into the ostomy reversal site.  Let soap and water run over the area in the shower daily.  Pat the area dry and then place a dry gauze over the top to protect your clothing.  If water from shower is pooling in wound -you may take a dry q tip to gently dab the wound to soak up the extra moisture.    It will take approximately 2-3 weeks for the wound to heal.     Bathing:  You may take a shower tomorrow, keeping incision site clean and dry.  No baths, jacuzzi, pools, or ocean swimming until all wounds are healed    Your medication list is located on this After Visit Summary in the Current Discharge Medication List section.  Your nurse will review this information with you before you leave the hospital.    It is very important for you to keep a current medication list with you in order to assist your doctors with your medical care.  Bring this After Visit Summary with you to your follow up appointments.    Reasons to Contact a Doctor Urgently:  Severe abdominal pain.  Nausea and vomiting.  Severe abdominal bloating.  Redness or swelling at the surgical (or wound) site.  Discharge or leakage from the surgical (  or wound) site.  Fevers or chills.  Bleeding from surgical (or wound) site.  Shortness of breath or difficulty breathing.  Chest pains or palpitations.  Unusual neurologic changes  Pain, redness or swelling in your legs    Call 911 or return to the hospital immediately if:  Increased or uncontrolled pain    You should contact either your primary care physician or your hospital physician for any of the following reasons: any concern not listed    If you have any questions about your hospital care, your medications, or if you have new or concerning symptoms soon after going home from the  hospital, and you need to contact your hospital physician, please see contact list below.    Once you are able to see your primary care physician (PCP), your PCP will then be responsible for further medication refills, or appointment referrals.    What Needs to Happen Next After Discharge -- Appointments and Follow Up    Any appointments already scheduled at [facility] clinics will be listed in the Future Appointments section at the top of this After Visit Summary.  Any appointments that have been requested, but have not yet been scheduled, will be listed below that under Post Discharge Referrals.    Sometimes tests performed in the hospital do not yet have results by the time a patient goes home.      The following key tests will need to be followed up at your next appointment:   Pathology evaluation of surgical specimens     Medical Home Information    Your primary care provider or clinic currently on file at [place] is: [facility] Rocker, Johnnette Litter    You currently have an advance directive or living will on file at [facility]: <no information>    Handouts Given to You (if applicable):    ----------------------------------------    Contact List:    Scheduling follow-up appointments or for any questions about symptoms, wound care, medications:   [facility] RN/Scheduling office [phone]    After 5pm, please call the [facility] operator [phone] or [facility] operator at [phone]. -Ask for the general surgery resident on call.    Questions specific to your stoma site:  Please contact outpatient wound/ostomy nurse:  [phone]

## 2023-02-11 NOTE — Progress Notes (Signed)
General Surgery Progress Note    Patient Name: Tiffany Chen MRN: [id number]    Hospital Day:   1 day - Admitted on: 02/10/2023  Post Operative Day(s): 1 Day Post-Op     HPI: Tiffany Chen is a 78 year old female with history of COPD, GERD and diverticulitis s/p ileostomy creation in 2009, presenting for scheduled gastric fundoplication and ostomy takedown     Events/Subjective:  - VSS, NAEON  - Feels well this AM, pain well controlled  - Thinks she may have passed gas overnight  - No Nausea    Vitals signs:  Latest entry:   Temperature: 98.7 F (37.1 C)  Heart Rate: 73  Respirations: 16  Blood pressure (BP): 139/69  SpO2: 97 %  Weight: 72.6 kg (160 lb 0.9 oz)  Percentage Weight Change (%): 6.28 %    Temperature:  [96.9 F (36.1 C)-98.7 F (37.1 C)] 98.7 F (37.1 C) (02/04 0400)  Blood pressure (BP): (129-154)/(61-76) 139/69 (02/04 0400)  Heart Rate:  [64-85] 73 (02/04 0400)  Respirations:  [13-24] 16 (02/04 0400)  Pain Score: NA (pre med, non-pain or scheduled) (02/04 0501)  O2 Device: Nasal cannula (02/04 0400)  O2 Flow Rate (L/min):  [2 l/min-6 l/min] 2 l/min (02/04 0400)  SpO2:  [93 %-98 %] 97 % (02/04 0400)    Intake/Output                         02/10/23 0600 - 02/11/23 0559 02/11/23 0600 - 02/12/23 0559     [id number] 1800-0559 Total 0600-1759 1800-0559 Total                 Intake    I.V.  1000  -- 1000  --  -- --    Total Intake 1000 -- 1000 -- -- --       Output    Urine  440  750 1190  --  -- --    Total Output [phone] -- -- --       Net I/O     560 -750 -190 -- -- --            Patient Lines/Drains/Airways Status       Active PICC Line / CVC Line / PIV Line / Drain / Airway / Intraosseous Line / Epidural Line / ART Line / Line Type / Wound / Pressure Ulcer Injury       Name Placement date Placement time Site Days    Peripheral IV - 22 G Left Hand 02/10/23  0641  Hand  less than 1    Indwelling Urinary Catheter -  02/10/23 RN Straight-tip;Temperature probe;Latex free 16 fr 10 ml Yes  02/10/23  0730  Straight-tip;Temperature probe;Latex free  less than 1    Incision -  02/10/23 1046 Abdomen Lower;Right 02/10/23  1046  -- less than 1    Incision -  02/10/23 1046 Abdomen 02/10/23  1046  -- less than 1                    Diet Diet Sips/Ice Chips    No data recorded     Labs:  CBC  No results for input(s): "WBC", "HGB", "HCT", "PLT", "BAND", "SEG", "LYMPHS", "MONOS" in the last 72 hours.    Chemistry  No results for input(s): "NA", "K", "CL", "BICARB", "BUN", "CREAT", "GLU", "[place]", "MG", "PHOS", "IONCA" in the last 72 hours.    Invalid input(s): "CAI2"  No results for input(s): "ALK", "AST", "ALT", "TBILI", "DBILI", "ALB" in the last 72 hours.    Coags  No results for input(s): "PT", "PTT", "INR" in the last 72 hours.    Radiology:   No results found.     Physical Exam:  General Appearance: alert, responsive, cooperative   Heart:  RR, WWP   Lungs: non-labored breathing on 2L NC  Abdomen: Soft, non-distended, appropriately tender, incisions c/d/I. No rebound or guarding.   Extremities:  no edema, wwp     Medications:  IV Meds   lactated ringers   100 mL/hr at 02/11/23 0507 New Bag at 02/11/23 0507     Scheduled Meds   acetaminophen  650 mg Q4H    fluoxetine  40 mg Daily    gabapentin  300 mg Q8H    lansoprazole  30 mg QAM AC    tiotropium-olodaterol  2 puff Daily     PRN Meds   albuterol  2 puff Q6H PRN    ondansetron  4 mg Q6H PRN    prochlorperazine  10 mg Q6H PRN    sodium chloride  3 mL Q8H PRN         ASSESSMENT / PLAN:  [name] is a 78 year old female with history of COPD, GERD and diverticulitis s/p ileostomy creation in 2009, now s/p gastric fundoplication, ostomy takedown     Neuro:   #Pain  Multimodal pain management   - Acetaminophen 650 mg PO q4h  - Gabapentin 30 mg PO q8h  - PRN dilaudid  - PRN Prochlorperazine/Zofran for nausea  CV: q4h vital signs  Pulmonary: Continuous pulse oximetry monitoring. Advised out of bed, incentive spirometry, maintaining saturations on room  air.  GI: Advance to clear liquid diet 2/4 AM.  FEN:  mIVF LR @ 100 cc/hr. DC once tolerating PO liquids. Replete electrolytes as needed.  GU/Renal: Strict In's and Out's. Remove foley, DTV  ID: Afebrile. Will culture for fevers > 101.4 F.   Hematology: Hgb stable. Start lovenox  Prophylaxis: Lansoprazole for GI prophylaxis; SCDs while in bed, lovenox  PT/OT: Ambulate with assist QID; OOB  Status:    The patient was examined with the team and discussed with attending, Dr. Tonita Phoenix, who agrees with the above assessment and plan.    Written in collaboration with Mardene Celeste, MS3.    Arelia Longest, MD, MPH  General Surgery Resident

## 2023-02-11 NOTE — Plan of Care (Signed)
Problem: Promotion of Health and Safety  Goal: Promotion of Health and Safety  Description: The patient remains safe, receives appropriate treatment and achieves optimal outcomes (physically, psychosocially, and spiritually) within the limitations of the disease process by discharge.    Information below is the current care plan.  Outcome: Progressing  Flowsheets  Taken 02/11/2023 0247  Individualized Interventions/Recommendations #1: Kept lap surgical site intact and dry, monitor abd surgical site for drainage, redness  Individualized Interventions/Recommendations #2 (if applicable): Assess for pain and administer pain meds as needed  Individualized Interventions/Recommendations #3 (if applicable): Continue pulse ox monitor per order, encourage to use deep breathing technique  Individualized Interventions/Recommendations #4 (if applicable): Applied SCD to promote circulation to prevent DVT  Individualized Interventions/Recommendations #5 (if applicable): Safety precaution in place, bed in lowest position, bed alarm on, call light within reach.  Outcome Evaluation (rationale for progressing/not progressing) every shift: Pt arrived from ED, all belongings accounted for at bedside, skin check with Annadel RN, CHG & room orientation done. Pt a&ox4 VSS, afebrile, on 2L NC, denies pain, tolerated schedule meds. Lap site intact, redness noted. Foley care done per protocol. SCD on. Remind pt to use call light when needed assistance. No further concerns at present. Plan of care ongoing, call light within reach.  Taken 02/10/2023 1950  Patient /Family stated Goal: to sleep  Note:

## 2023-02-11 NOTE — Plan of Care (Addendum)
Problem: Promotion of Health and Safety  Goal: Promotion of Health and Safety  Description: The patient remains safe, receives appropriate treatment and achieves optimal outcomes (physically, psychosocially, and spiritually) within the limitations of the disease process by discharge.    Information below is the current care plan.  Outcome: Progressing  Flowsheets  Taken 02/11/2023 1251 by Hale Drone, RN  Individualized Interventions/Recommendations #4 (if applicable): assess for pain  Outcome Evaluation (rationale for progressing/not progressing) every shift:   Pt is oriented x4, Foley cath d/c'd around 1020, pt has not voided on her own yet at this time.  O2 d/c'd around 1030, O2 sat is 95% in RA at rest   pt ambulated in the hallway with CCP Eliuth, O2 sat in RA while ambulating is 96%.  Pt tolerating clear liquid.  On scheduled Tylenol, with relief noted.  Lap sites on abdomen are intact, gauze dressings on R lower abdomen are intact.  Pt has bowel sounds present.  PT to follow pt.  Siderails x2 up in bed, placed call button within reach.  Taken 02/11/2023 0800 by Hale Drone, RN  Patient /Family stated Goal: have the Foley cath out after breakfast and walk around  Taken 02/11/2023 0247 by Arlean Hopping, RN  Individualized Interventions/Recommendations #1: Kept lap surgical site intact and dry, monitor abd surgical site for drainage, redness  Individualized Interventions/Recommendations #2 (if applicable): Assess for pain and administer pain meds as needed  Individualized Interventions/Recommendations #3 (if applicable): Continue pulse ox monitor per order, encourage to use deep breathing technique  Individualized Interventions/Recommendations #5 (if applicable): Encourage to use deep breathing technique  Note:

## 2023-02-12 LAB — CBC WITH DIFF, BLOOD
ANC-Automated: 6.4 10*3/uL (ref 1.6–7.0)
Abs Basophils: 0.1 10*3/uL (ref <0.2)
Abs Eosinophils: 0.1 10*3/uL (ref 0.0–0.5)
Abs Lymphs: 1.2 10*3/uL (ref 0.8–3.1)
Abs Monos: 0.9 10*3/uL — ABNORMAL HIGH (ref 0.2–0.8)
Basophils: 0.6 %
Eosinophils: 0.7 %
Hct: 35.4 % (ref 34.0–45.0)
Hgb: 11.6 g/dL (ref 11.2–15.7)
Imm Gran %: 0.2 % (ref <1)
Imm Gran Abs: 0 10*3/uL (ref <0.1)
Lymphocytes: 14.4 %
MCH: 30.3 pg (ref 26.0–32.0)
MCHC: 32.8 g/dL (ref 32.0–36.0)
MCV: 92.4 um3 (ref 79.0–95.0)
MPV: 10.2 fL (ref 9.4–12.4)
Monocytes: 10 %
Plt Count: 193 10*3/uL (ref 140–370)
RBC: 3.83 10*6/uL — ABNORMAL LOW (ref 3.90–5.20)
RDW: 13.4 % (ref 12.0–14.0)
Segs: 74.1 %
WBC: 8.6 10*3/uL (ref 4.0–10.0)

## 2023-02-12 LAB — BASIC METABOLIC PANEL, BLOOD
Anion Gap: 10 mmol/L (ref 7–15)
BUN: 11 mg/dL (ref 8–23)
Bicarbonate: 21 mmol/L — ABNORMAL LOW (ref 22–29)
Calcium: 8.7 mg/dL (ref 8.5–10.6)
Chloride: 102 mmol/L (ref 98–107)
Creatinine: 0.75 mg/dL (ref 0.51–0.95)
Glucose: 170 mg/dL — ABNORMAL HIGH (ref 70–99)
Potassium: 3.9 mmol/L (ref 3.5–5.1)
Sodium: 133 mmol/L — ABNORMAL LOW (ref 136–145)
eGFR Based on CKD-EPI 2021 Equation: >60 mL/min/{1.73_m2}

## 2023-02-12 LAB — URINALYSIS WITH CULTURE REFLEX, WHEN INDICATED
Bilirubin: NEGATIVE
Glucose: NEGATIVE
Ketones: NEGATIVE
Leuk Esterase: NEGATIVE Leu/uL
Nitrite: NEGATIVE
Specific Gravity: 1.022 (ref 1.002–1.030)
Urobilinogen: NEGATIVE
pH: 6 (ref 5.0–8.0)

## 2023-02-12 LAB — PHOSPHORUS, BLOOD: Phosphorous: 2.1 mg/dL — ABNORMAL LOW (ref 2.7–4.5)

## 2023-02-12 LAB — MAGNESIUM, BLOOD: Magnesium: 1.7 mg/dL (ref 1.6–2.4)

## 2023-02-12 MED ORDER — SODIUM PHOSPHATE 10 MEQ/50 ML NS (~~LOC~~)
10.0000 meq | Status: AC
Start: 2023-02-12 — End: 2023-02-12
  Administered 2023-02-12 (×3): 10 meq via INTRAVENOUS
  Filled 2023-02-12 (×3): qty 50

## 2023-02-12 MED ORDER — MAGNESIUM OXIDE 400 MG OR TABS
400.0000 mg | ORAL_TABLET | Freq: Once | ORAL | Status: AC
Start: 2023-02-12 — End: 2023-02-12
  Administered 2023-02-12: 400 mg via ORAL
  Filled 2023-02-12: qty 1

## 2023-02-12 MED ORDER — TAMSULOSIN HCL 0.4 MG PO CAPS
0.4000 mg | ORAL_CAPSULE | Freq: Every day | ORAL | Status: DC
Start: 2023-02-12 — End: 2023-02-14
  Administered 2023-02-12 – 2023-02-14 (×3): 0.4 mg via ORAL
  Filled 2023-02-12 (×3): qty 1

## 2023-02-12 NOTE — Discharge Summary (Shared)
Date of Admission:  02/10/2023  Date of Discharge:  ***    Patient Name:  Tiffany Chen    Principal Diagnosis (required):  {Principal Diagnosis:15458}    Hospital Problem List (required):  Active Hospital Problems    Diagnosis    *Chronic GERD [K21.9]      Resolved Hospital Problems   No resolved problems to display.       Additional Hospital Diagnoses ("rule out" or "suspected" diagnoses, etc.):              ***    Principal Procedure During This Hospitalization (required):  {Procedures:12743}    Other Procedures Performed During This Hospitalization (required):  {Procedures:12743}    Procedure results are available in Chart Review in Epic.  For those providers external to [place] [facility], the key procedure results are listed below:  ***    Consultations Obtained During This Hospitalization:  {Consulting Services:12736}    Key consultant recommendations:  ***    Reason for Admission to the Hospital / History of Present Illness:  ***    Hospital Course by Problem (required):  {Enter Hospital Course vs. Use Hospital Course Note SmartLink:31665}    Tests Outstanding at Discharge Requiring Follow Up:  {Outstanding Tests:13171}    Discharge Condition (required):  {Condition:12737}.    Key Physical Exam Findings at Discharge:  Mental Status Exam: Patient is {Mental Status Orientation:22466::"alert and oriented to person, place, time, and situation"}.  {Physical Exam Findings:13175}.    Discharge Diet:  {Select ZOXW:96045}.    Discharge Disposition:  {Disposition:12745}.    {Within approximately the next month, will this patient require a planned future hospitalization for additional/scheduled care (such as catheterization, scheduled/re-scheduled surgery, or chemotherapy)?:28793}    Discharge Code Status:  {Code Status:13173}  {Code Status New/Unchanged:13179}    Discharge Medications:  {Fire This Link Only When ALL Discharge Medication Documentation Has Been Completed:13193}    Allergies:  Allergies   Allergen Reactions     Walnuts Swelling     Tongue swollen and burn       Follow Up Appointments:    Scheduled appointments:  {Fire This Link Only When Discharge Summary is Ready for Final Signature:13194}    For appointments requested for after discharge that have not yet been scheduled, refer to the Post Discharge Referrals section of the After Visit Summary.    Discharging Physician's Contact Information:  {Contact Information:13172}.    Time Spent - (for use by billing providers only)  *** minutes were spent in direct patient care activities, discharge planning, and coordination of care.

## 2023-02-12 NOTE — Plan of Care (Signed)
Problem: Promotion of Health and Safety  Goal: Promotion of Health and Safety  Description: The patient remains safe, receives appropriate treatment and achieves optimal outcomes (physically, psychosocially, and spiritually) within the limitations of the disease process by discharge.    Information below is the current care plan.  Outcome: Progressing  Flowsheets  Taken 02/12/2023 1514 by Hale Drone, RN  Outcome Evaluation (rationale for progressing/not progressing) every shift:   Pt is oriented x4, on scheduled Tylenol as ordered with relief noted from abdominal pain, pt reported having episode of no control of bladder(urinary incontinence and urgency)- priamry team aware   pt urinated on her own around 1055 with , bladder scan done afterwards=35ml, primary team was in the room and aware.  Foley cath insertion ordered by MD.  Foley cath inserted as ordered and urine specimen sent for urine cx as ordered,  pt is independent, refusing bed alarm, siderails x2 up in bed, placed call button within reach.  Deit upgraded to full iquid, carb limited at lunch, tolerating diet.  Will continue to monitor.  Taken 02/12/2023 0800 by Hale Drone, RN  Patient /Family stated Goal: to pee more  Taken 02/12/2023 0130 by Cathlean Marseilles, RN  Guidelines: Inpatient Nursing Guidelines  Individualized Interventions/Recommendations #2 (if applicable): Monitor pain and adminsiter medication as prescribed  Individualized Interventions/Recommendations #4 (if applicable): Monitor I&O and notify MD  Taken 02/11/2023 0247 by Arlean Hopping, RN  Individualized Interventions/Recommendations #5 (if applicable): Encourage to use deep breathing technique  Note:

## 2023-02-12 NOTE — Progress Notes (Addendum)
General Surgery Progress Note    Patient Name: Tiffany Chen MRN: [id number]    Hospital Day:   2 days - Admitted on: 02/10/2023  Post Operative Day(s): 2 Days Post-Op     HPI: Tiffany Chen is a 78 year old female with history of COPD, GERD and diverticulitis s/p ileostomy creation in 2009, presenting for scheduled gastric fundoplication and ostomy takedown     Events/Subjective:  - VSS, NAEON  - tolerated clears, no pain with swallowing  - Low UOP overnight, feels like she should probably be drinking more but also doesn't feel like she has the urge to void.  - 500 mL bolus overnight  - Pain well controlled  - No Nausea, passing gas and small liquid bowel movements  - Ambulating    Vitals signs:  Latest entry:   Temperature: 99.5 F (37.5 C)  Heart Rate: 90  Respirations: 18  Blood pressure (BP): 135/71  SpO2: 93 %  Weight: 72.6 kg (160 lb 0.9 oz)  Percentage Weight Change (%): 6.28 %    Temperature:  [98.4 F (36.9 C)-99.5 F (37.5 C)] 99.5 F (37.5 C) (02/04 2333)  Blood pressure (BP): (131-145)/(64-71) 135/71 (02/04 2333)  Heart Rate:  [74-90] 90 (02/04 2333)  Respirations:  [16-18] 18 (02/04 2333)  Pain Score: 0 (02/05 0403)  O2 Device: None (Room air) (02/04 1903)  O2 Flow Rate (L/min):  [2 l/min] 2 l/min (02/04 0708)  SpO2:  [93 %-96 %] 93 % (02/04 2333)    Intake/Output                         02/11/23 0600 - 02/12/23 0559 02/12/23 0600 - 02/13/23 0559     [id number] 1800-0559 Total 0600-1759 1800-0559 Total                 Intake    P.O.  480  -- 480  --  -- --    Total Intake 480 -- 480 -- -- --       Output    Urine  50  400 450  --  -- --    Total Output 50 400 450 -- -- --       Net I/O     430 -400 30 -- -- --            Patient Lines/Drains/Airways Status       Active PICC Line / CVC Line / PIV Line / Drain / Airway / Intraosseous Line / Epidural Line / ART Line / Line Type / Wound / Pressure Ulcer Injury       Name Placement date Placement time Site Days    Peripheral IV - 22 G Left Hand  02/10/23  0641  Hand  1    Incision -  02/10/23 1046 Abdomen Lower;Right 02/10/23  1046  -- 1    Incision -  02/10/23 1046 Abdomen 02/10/23  1046  -- 1                    Diet Diet Clear Liquid; Zero Carbohydrate Clear Liquid    No data recorded     Labs:  CBC  Recent Labs     02/11/23  0646   WBC 9.2   HGB 11.4   HCT 34.0   PLT 200   SEG 80.2   LYMPHS 9.9   MONOS 9.4       Chemistry  Recent Labs  02/11/23  0646   NA 136   K 4.0   CL 103   BICARB 21*   BUN 18   CREAT 0.80   GLU 167*   [place] 8.8   MG 1.8   PHOS 3.6       Recent Labs     02/11/23  0646   ALK 61   AST 64*   ALT 57*   TBILI 0.46   ALB 3.4*       Coags  No results for input(s): "PT", "PTT", "INR" in the last 72 hours.    Radiology:   No results found.     Physical Exam:  General Appearance: alert, responsive, cooperative   Heart:  RR, WWP   Lungs: non-labored breathing on RA   Abdomen: Soft, non-distended, appropriately tender, incisions c/d/I. No rebound or guarding. Ostomy site dressing removed, clean based and hemostatic. Clean gauze dressing replaced.   Extremities:  no edema, wwp     Medications:  IV Meds      Scheduled Meds   acetaminophen  650 mg Q4H    enoxaparin  40 mg Daily    fluoxetine  40 mg Daily    gabapentin  300 mg Q8H    lansoprazole  30 mg QAM AC    tiotropium-olodaterol  2 puff Daily     PRN Meds   albuterol  2 puff Q6H PRN    ondansetron  4 mg Q6H PRN    prochlorperazine  10 mg Q6H PRN    sodium chloride  3 mL Q8H PRN         ASSESSMENT / PLAN:  Tiffany Chen is a 78 year old female with history of COPD, GERD and diverticulitis s/p ileostomy creation in 2009, now s/p gastric fundoplication, ostomy takedown. Recovering well.     Neuro:   #Pain  Multimodal pain management   - Acetaminophen 650 mg PO q4h  - Gabapentin 30 mg PO q8h  - PRN dilaudid  - PRN Prochlorperazine/Zofran for nausea  CV: q4h vital signs  Pulmonary: Continuous pulse oximetry monitoring. Advised out of bed, incentive spirometry, maintaining saturations on room  air.  GI/FEN:  advance to FLD. Replete electrolytes as needed.  GU/Renal: Strict In's and Out's. Foley removed yesterday - last PVR @7am  435cc after voiding 100cc. Foley replaced today and UA sent.   ID: Afebrile. Will culture for fevers > 101.4 F. UA sent.   Hematology: Hgb stable. Lovenox  Prophylaxis: Lansoprazole for GI prophylaxis; SCDs while in bed, lovenox  PT/OT: Ambulate with assist QID; OOB  Wound: daily dressing changes    The patient was examined with the team and discussed with attending, Dr. Tonita Phoenix, who agrees with the above assessment and plan.    Written in collaboration with Mardene Celeste, MS3.    Alphonzo Grieve, PA  Surgical Subspecialties     This note Non-Critical Care: represents a split/shared encounter with my supervising attending Dr. Dorian Furnace, MD on 02/12/23. Due to the patient's complex medical condition and the need for medical decision making at the level of the physician, I have seen this patient in collaboration with my supervising attending listed above.The attending will be the billing provider for this patient.     Alphonzo Grieve, PA

## 2023-02-12 NOTE — Plan of Care (Signed)
Problem: Promotion of Health and Safety  Goal: Promotion of Health and Safety  Description: The patient remains safe, receives appropriate treatment and achieves optimal outcomes (physically, psychosocially, and spiritually) within the limitations of the disease process by discharge.    Information below is the current care plan.  Outcome: Progressing  Flowsheets  Taken 02/12/2023 0130  Guidelines: Inpatient Nursing Guidelines  Individualized Interventions/Recommendations #1: Monitor VS and notify MD of any abnormality  Individualized Interventions/Recommendations #2 (if applicable): Monitor pain and adminsiter medication as prescribed  Individualized Interventions/Recommendations #3 (if applicable): Fall precautions in place  Individualized Interventions/Recommendations #4 (if applicable): Monitor I&O and notify MD  Taken 02/11/2023 1945  Patient /Family stated Goal: to rest  Note:  Pt A&Ox4, VSS on RA. Pt rates pain 3/10, scheduled tylenol administered, pt tolerated. Fall precautions in place, call light within reach, bed in lowest position, non skid footwear, Pt educated on call for assistance when getting out of bed. Pt PVR of , ordered LR administered, pt tolerated.

## 2023-02-12 NOTE — Interdisciplinary (Signed)
02/12/23 1446   Readmission Risk Assessment   Where was the patient admitted from? * Home   Readmission Within 30 Days of Discharge * No   Recent Hospitalizations (Within Last 6 Months) * No   High Risk For Readmission * Yes   Patient Information   Primary Family/Caregiver Contact Name, Number and Relationship * Vallarie Mare [phone]   Permission to Contact * Yes   Stairs/Steps to home * Yes   Address on Facesheet correct?* Yes   Patient contact phone number on Facesheet correct? * Yes   PCP listed on Facesheet correct? * Yes   Assistive Device * Not applicable   Home Care Services * No   Patient's Discharge Goal(s) * Home   Income Information   Veterans Affiliation No   Do you have difficulty affording your medications * No   Patient Transportation   Do You Have Transportation Issues/Concerns That Make It Difficult To Get To Your Appointments? * No   Will someone be coming to the hospital to take you home upon discharge?  Yes   Discharge   Anticipated Discharge Disposition/Needs * Home with Family   Barriers to Discharge * None   Family/Caregiver's Assessed for * Not Applicable   Respite Care * Not Applicable   Food Insecurity   Within the past 12 months, you worried that your food would run out before you got the money to buy more. Never true   Within the past 12 months, the food you bought just didn't last and you didn't have money to get more. Never true   Transportation Needs   In the past 12 months, has lack of transportation kept you from medical appointments or from getting medications? no   In the past 12 months, has lack of transportation kept you from meetings, work, or from getting things needed for daily living? No   Housing Stability   In the last 12 months, was there a time when you were not able to pay the mortgage or rent on time? N   In the past 12 months, how many times have you moved where you were living? 0   At any time in the past 12 months, were you homeless or living in a shelter  (including now)? N   Utilities   In the past 12 months has the electric, gas, oil, or water company threatened to shut off services in your home? No   Social Worker Consult   Do you need to see a Child psychotherapist? * No   Initial Assessment   CM Initial Assessment * Completed     Medical Necessity:  Hiatal hernia repair and colostomy takedown    Interpreter Used: NA    LACE+ Score: 62      PCP verified:  Fredric Mare  [facility] [address] [place] [address]  telephone [phone]  fax [phone]    Pharmacy:  [facility] [phone] - [place], [place] - [address]    PLOF: Independent     SNF Hx & preference:  NA  HH Hx & preference:  Cannot recall name  DME Hx & preference: NA     DISCHARGE PLANNING    Social Support: Lives alone in granny flat on family property with brother and sister living on same property. Has friends and family that live local and can provide assist    Anticipated DC disposition: Home    Anticipated DC needs: None    Anticipated barriers to  discharge: Urinary retention    Anticipated Transportation @ discharge: Family            Expected discharge date: tomorrow     Renold Don, RN   Case Manager

## 2023-02-13 LAB — CBC WITH DIFF, BLOOD
ANC-Automated: 5.4 10*3/uL (ref 1.6–7.0)
Abs Basophils: 0 10*3/uL (ref <0.2)
Abs Eosinophils: 0.1 10*3/uL (ref 0.0–0.5)
Abs Lymphs: 0.9 10*3/uL (ref 0.8–3.1)
Abs Monos: 0.8 10*3/uL (ref 0.2–0.8)
Basophils: 0.4 %
Eosinophils: 1.5 %
Hct: 33.3 % — ABNORMAL LOW (ref 34.0–45.0)
Hgb: 11.2 g/dL (ref 11.2–15.7)
Imm Gran %: 0.4 % (ref <1)
Imm Gran Abs: 0 10*3/uL (ref <0.1)
Lymphocytes: 12.6 %
MCH: 30.6 pg (ref 26.0–32.0)
MCHC: 33.6 g/dL (ref 32.0–36.0)
MCV: 91 um3 (ref 79.0–95.0)
MPV: 10.2 fL (ref 9.4–12.4)
Monocytes: 10.6 %
Plt Count: 194 10*3/uL (ref 140–370)
RBC: 3.66 10*6/uL — ABNORMAL LOW (ref 3.90–5.20)
RDW: 13.2 % (ref 12.0–14.0)
Segs: 74.5 %
WBC: 7.3 10*3/uL (ref 4.0–10.0)

## 2023-02-13 LAB — GLUCOSE (POCT)
Glucose (POCT): 199 mg/dL — ABNORMAL HIGH (ref 70–99)
Glucose (POCT): 163 mg/dL — ABNORMAL HIGH (ref 70–99)
Glucose (POCT): 264 mg/dL — ABNORMAL HIGH (ref 70–99)

## 2023-02-13 LAB — BASIC METABOLIC PANEL, BLOOD
Anion Gap: 12 mmol/L (ref 7–15)
BUN: 10 mg/dL (ref 8–23)
Bicarbonate: 21 mmol/L — ABNORMAL LOW (ref 22–29)
Calcium: 8.7 mg/dL (ref 8.5–10.6)
Chloride: 101 mmol/L (ref 98–107)
Creatinine: 0.71 mg/dL (ref 0.51–0.95)
Glucose: 176 mg/dL — ABNORMAL HIGH (ref 70–99)
Potassium: 3.6 mmol/L (ref 3.5–5.1)
Sodium: 134 mmol/L — ABNORMAL LOW (ref 136–145)
eGFR Based on CKD-EPI 2021 Equation: >60 mL/min/{1.73_m2}

## 2023-02-13 LAB — MAGNESIUM, BLOOD: Magnesium: 1.8 mg/dL (ref 1.6–2.4)

## 2023-02-13 LAB — PHOSPHORUS, BLOOD: Phosphorous: 2.9 mg/dL (ref 2.7–4.5)

## 2023-02-13 MED ORDER — GLUCOSE 4 GM PO CHEW (CUSTOM)
4.0000 | CHEWABLE_TABLET | ORAL | Status: DC | PRN
Start: 2023-02-13 — End: 2023-02-14

## 2023-02-13 MED ORDER — DEXTROSE 50 % IV SOLN
12.5000 g | INTRAVENOUS | Status: DC | PRN
Start: 2023-02-13 — End: 2023-02-14

## 2023-02-13 MED ORDER — GLUCAGON HCL 1 MG IJ SOLR
1.0000 mg | Freq: Once | INTRAMUSCULAR | Status: DC | PRN
Start: 2023-02-13 — End: 2023-02-14

## 2023-02-13 MED ORDER — INSULIN LISPRO (HUMAN) 100 UNIT/ML SC SOLN (CUSTOM)
0.0000 [IU] | Freq: Every evening | INTRAMUSCULAR | Status: DC
Start: 2023-02-13 — End: 2023-02-14
  Administered 2023-02-13: 2 [IU] via SUBCUTANEOUS
  Filled 2023-02-13: qty 2

## 2023-02-13 MED ORDER — DEXTROSE (DIABETIC USE) 40 % OR GEL
1.0000 | ORAL | Status: DC | PRN
Start: 2023-02-13 — End: 2023-02-14

## 2023-02-13 MED ORDER — INSULIN LISPRO (HUMAN) 100 UNIT/ML SC SOLN (CUSTOM)
0.0000 [IU] | Freq: Three times a day (TID) | INTRAMUSCULAR | Status: DC
Start: 2023-02-13 — End: 2023-02-14
  Administered 2023-02-13 (×2): 1 [IU] via SUBCUTANEOUS
  Administered 2023-02-14 (×2): 2 [IU] via SUBCUTANEOUS
  Administered 2023-02-14: 1 [IU] via SUBCUTANEOUS
  Filled 2023-02-13: qty 2
  Filled 2023-02-13 (×4): qty 1

## 2023-02-13 NOTE — Progress Notes (Signed)
General Surgery Progress Note    Patient Name: Tiffany Chen MRN: [id number]    Hospital Day:   3 days - Admitted on: 02/10/2023  Post Operative Day(s): 3 Days Post-Op     HPI: Tiffany Chen is a 78 year old female with history of COPD, GERD and diverticulitis s/p ileostomy creation in 2009, presenting for scheduled gastric fundoplication and ostomy takedown     Events/Subjective:  - VSS, NAEON  - tolerated fulls well   - Urinary retention requiring foley replacement yesterday  - Having some difficulty with pain control  - Nervous about going home   - No Nausea, passing gas and having BM   - Ambulating    Vitals signs:  Latest entry:   Temperature: 98.3 F (36.8 C)  Heart Rate: 80  Respirations: 18  Blood pressure (BP): 121/67  SpO2: 96 %  Weight: 72.6 kg (160 lb 0.9 oz)  Percentage Weight Change (%): 6.28 %    Temperature:  [98 F (36.7 C)-99.3 F (37.4 C)] 98.3 F (36.8 C) (02/06 0733)  Blood pressure (BP): (121-132)/(60-74) 121/67 (02/06 0733)  Heart Rate:  [80-96] 80 (02/06 0750)  Respirations:  [17-18] 18 (02/06 0750)  Pain Score: NA (pre med, non-pain or scheduled) (02/06 0406)  O2 Device: None (Room air) (02/06 0356)  SpO2:  [93 %-96 %] 96 % (02/06 0750)    Intake/Output                         02/12/23 0600 - 02/13/23 0559 02/13/23 0600 - 02/14/23 0559     [id number] 1800-0559 Total 0600-1759 1800-0559 Total                 Intake    P.O.  480  -- 480  --  -- --    I.V.  150  -- 150  --  -- --    Total Intake 630 -- 630 -- -- --       Output    Urine  1125  700 1825  --  -- --    Total Output 1125 700 1825 -- -- --       Net I/O     -495 -700 -1195 -- -- --            Patient Lines/Drains/Airways Status       Active PICC Line / CVC Line / PIV Line / Drain / Airway / Intraosseous Line / Epidural Line / ART Line / Line Type / Wound / Pressure Ulcer Injury       Name Placement date Placement time Site Days    Peripheral IV - 22 G Left Hand 02/10/23  0641  Hand  3    Indwelling Urinary Catheter -   02/12/23 RN Latex free 16 fr 10 ml Yes 02/12/23  1134  Latex free  less than 1    Incision -  02/10/23 1046 Abdomen Lower;Right 02/10/23  1046  -- 2    Incision -  02/10/23 1046 Abdomen 02/10/23  1046  -- 2                    Diet Diet Full Liquid; Standard Carbohydrate Limited    No data recorded     Labs:  CBC  Recent Labs     02/12/23  0620 02/13/23  0520   WBC 8.6 7.3   HGB 11.6 11.2   HCT 35.4 33.3*   PLT 193  194   SEG 74.1 74.5   LYMPHS 14.4 12.6   MONOS 10.0 10.6       Chemistry  Recent Labs     02/12/23  0620 02/13/23  0519   NA 133* 134*   K 3.9 3.6   CL 102 101   BICARB 21* 21*   BUN 11 10   CREAT 0.75 0.71   GLU 170* 176*   [place] 8.7 8.7   MG 1.7 1.8   PHOS 2.1* 2.9       Recent Labs     02/11/23  0646   ALK 61   AST 64*   ALT 57*   TBILI 0.46   ALB 3.4*       Coags  No results for input(s): "PT", "PTT", "INR" in the last 72 hours.    Radiology:   No results found.     Physical Exam:  General Appearance: alert, responsive, cooperative   Heart:  RR, WWP   Lungs: non-labored breathing on RA   Abdomen: Soft, non-distended, appropriately tender, incisions c/d/I. No rebound or guarding. Ostomy site with dressing in place, c/d/i  Extremities:  no edema, wwp     Medications:  IV Meds      Scheduled Meds   acetaminophen  650 mg Q4H    enoxaparin  40 mg Daily    fluoxetine  40 mg Daily    gabapentin  300 mg Q8H    lansoprazole  30 mg QAM AC    tamsulosin  0.4 mg Daily with food    tiotropium-olodaterol  2 puff Daily     PRN Meds   albuterol  2 puff Q6H PRN    ondansetron  4 mg Q6H PRN    prochlorperazine  10 mg Q6H PRN    sodium chloride  3 mL Q8H PRN         ASSESSMENT / PLAN:  Tiffany Chen is a 78 year old female with history of COPD, GERD and diverticulitis s/p ileostomy creation in 2009, now s/p gastric fundoplication, ostomy takedown. Recovering well.     Neuro:   #Pain  Multimodal pain management   - Acetaminophen 650 mg PO q4h  - Gabapentin 30 mg PO q8h  - PRN oxy  - PRN Prochlorperazine/Zofran for  nausea  CV: q4h vital signs  Pulmonary: Continuous pulse oximetry monitoring. Advised out of bed, incentive spirometry, maintaining saturations on room air.  GI/FEN:  advance to FLD. Replete electrolytes as needed.  GU/Renal: Strict In's and Out's. Foley replaced yesterday given urinary retention. Anticipate discharge with foley in place   ID: Afebrile. Will culture for fevers > 101.4 F. UA with mild microscopic hemoglobinuria, no cultures  Hematology: Hgb stable. Lovenox  Prophylaxis: Lansoprazole for GI prophylaxis; SCDs while in bed, lovenox  PT/OT: Ambulate with assist QID; OOB  Wound: daily dressing changes    The patient was examined with the team and discussed with attending, Dr. Tonita Phoenix, who agrees with the above assessment and plan.    Arelia Longest, MD, MPH  General Surgery Resident

## 2023-02-13 NOTE — Plan of Care (Signed)
Problem: Promotion of Health and Safety  Goal: Promotion of Health and Safety  Description: The patient remains safe, receives appropriate treatment and achieves optimal outcomes (physically, psychosocially, and spiritually) within the limitations of the disease process by discharge.    Information below is the current care plan.  Outcome: Progressing  Flowsheets  Taken 02/13/2023 1439 by Hale Drone, RN  Individualized Interventions/Recommendations #1: monitor tolerance of diet, encourage to consume at least 50% of meals  Outcome Evaluation (rationale for progressing/not progressing) every shift:   Pt is oriented x4, Foley cath in place,draining yellow urine   pt on scheduled Tylenol for abdominal pain with relief noted.  Pt remains on Flomax.  Tolerating full liquid diet(carb limted)   FBG checks AC/HS (started before lunch as ordered).  FBG before lunch =199, covered with Lispro sliding scale as ordered.  Encouraged ambulation and encouraged to use IS.  Pt is independent, has steady gait.  Gauze dressing on R abdomen is intact.  Lap sites are intact.  Placed call button within reac,  Siderails x2 up in bed.  Taken 02/13/2023 0800 by Hale Drone, RN  Patient /Family stated Goal: walk more  Taken 02/12/2023 0130 by Cathlean Marseilles, RN  Guidelines: Inpatient Nursing Guidelines  Individualized Interventions/Recommendations #2 (if applicable): Monitor pain and adminsiter medication as prescribed  Individualized Interventions/Recommendations #4 (if applicable): Monitor I&O and notify MD  Taken 02/11/2023 0247 by Arlean Hopping, RN  Individualized Interventions/Recommendations #5 (if applicable): Encourage to use deep breathing technique  Note:

## 2023-02-13 NOTE — Plan of Care (Signed)
Problem: Promotion of Health and Safety  Goal: Promotion of Health and Safety  Description: The patient remains safe, receives appropriate treatment and achieves optimal outcomes (physically, psychosocially, and spiritually) within the limitations of the disease process by discharge.    Information below is the current care plan.  Outcome: Progressing  Flowsheets  Taken 02/12/2023 1930  Patient /Family stated Goal: to sleep  Taken 02/12/2023 0130  Guidelines: Inpatient Nursing Guidelines  Individualized Interventions/Recommendations #1: Monitor VS and notify MD of any abnormality  Individualized Interventions/Recommendations #2 (if applicable): Monitor pain and adminsiter medication as prescribed  Individualized Interventions/Recommendations #3 (if applicable): Fall precautions in place  Note: Pt A&Ox4, VSS. Pt denies pain, on scheduled tylenol. Foley catheter in place for urinary retention. Fall precautions in place, call light within reach, bed in lowest position, non skid footwear.

## 2023-02-13 NOTE — Interdisciplinary (Addendum)
02/13/23 1109   Assessment   Assessment Type Initial;Face to Face;Verbal   Referral Information   Consult Type Discharge Planning   Social Assessment   Where was the patient admitted from? * Home   Prior to Level of Function * Needed Some Assist with Mobility   Primary Family/Caregiver Contact Name, Number and Relationship * [name] [phone]   Secondary Contact Name, Number and Relationship Sister, [name] [phone]   Interpreter Used? Not Needed   Literacy Can read;Can write   Living Arrangements on Admission* Alone  (Pt lives alone in a granny flat, however brother and sister live on the same property. 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   Mental Status No issues   Behavioral Assessment No issues   Physical Assessment No issues   Mental Status - Orientation Per chart review, pt generally AOx4   Notify Treatment Team to Assess Patient's Capacity? No concerns at this time   Audit-C Questionnaire    Substance Abuse History Per chart review, no SUD or etoh concerns identified.   Plans/Interventions/Discharge   Plan/Interventions Explore needs and options for aftercare, provide referrals   Anticipated Discharge Destination Home   Discharge Resources Given SW to follow up with pt when awake to discuss potiental resources.   Barriers to Discharge *  None     Social Work Progress Note    Referral Source:  SW referral from to complete SWIA.     Background Medical Information:  Per recent MD note:     Tiffany Chen is a 78 year old female with history of GERD and diverticulitis s/p end colostomy creation in 2009, presenting for scheduled gastric fundoplication and ostomy takedown     SW Update / Progress:  SW attempted to meet with pt bedside to complete SWIA. PT however working with lab techs obtaining blood and state they will be in the room for a while. SW completed assessment via chart review. Per primary RN and interdisciplinary team rounds, pt without any SW needs at this time. SW will follow up with pt once awake to inquire about needed resources.     Plan:  SW met w/patient to complete assessment and introduce role of inpatient SW.   SW provided supportive counseling and reflective listening.   Pt anticipated to DC to home with family.   SW will remain available to provide assistance and support safe discharge.     Jonna Munro, MPH  Clinical Child psychotherapist  [facility] [place]

## 2023-02-14 LAB — GLUCOSE (POCT)
Glucose (POCT): 206 mg/dL — ABNORMAL HIGH (ref 70–99)
Glucose (POCT): 177 mg/dL — ABNORMAL HIGH (ref 70–99)
Glucose (POCT): 213 mg/dL — ABNORMAL HIGH (ref 70–99)

## 2023-02-14 LAB — BASIC METABOLIC PANEL, BLOOD
Anion Gap: 13 mmol/L (ref 7–15)
BUN: 11 mg/dL (ref 8–23)
Bicarbonate: 22 mmol/L (ref 22–29)
Calcium: 8.7 mg/dL (ref 8.5–10.6)
Chloride: 98 mmol/L (ref 98–107)
Creatinine: 0.78 mg/dL (ref 0.51–0.95)
Glucose: 200 mg/dL — ABNORMAL HIGH (ref 70–99)
Potassium: 3.5 mmol/L (ref 3.5–5.1)
Sodium: 133 mmol/L — ABNORMAL LOW (ref 136–145)
eGFR Based on CKD-EPI 2021 Equation: >60 mL/min/{1.73_m2}

## 2023-02-14 LAB — CBC WITH DIFF, BLOOD
ANC-Automated: 5.1 10*3/uL (ref 1.6–7.0)
Abs Basophils: 0 10*3/uL (ref <0.2)
Abs Eosinophils: 0.2 10*3/uL (ref 0.0–0.5)
Abs Lymphs: 1 10*3/uL (ref 0.8–3.1)
Abs Monos: 0.7 10*3/uL (ref 0.2–0.8)
Basophils: 0.4 %
Eosinophils: 3.4 %
Hct: 33.1 % — ABNORMAL LOW (ref 34.0–45.0)
Hgb: 11.2 g/dL (ref 11.2–15.7)
Imm Gran %: 0.3 % (ref <1)
Imm Gran Abs: 0 10*3/uL (ref <0.1)
Lymphocytes: 13.9 %
MCH: 31.1 pg (ref 26.0–32.0)
MCHC: 33.8 g/dL (ref 32.0–36.0)
MCV: 91.9 um3 (ref 79.0–95.0)
MPV: 10.4 fL (ref 9.4–12.4)
Monocytes: 10.2 %
Plt Count: 229 10*3/uL (ref 140–370)
RBC: 3.6 10*6/uL — ABNORMAL LOW (ref 3.90–5.20)
RDW: 13.3 % (ref 12.0–14.0)
Segs: 71.8 %
WBC: 7.1 10*3/uL (ref 4.0–10.0)

## 2023-02-14 LAB — MAGNESIUM, BLOOD: Magnesium: 1.8 mg/dL (ref 1.6–2.4)

## 2023-02-14 LAB — PHOSPHORUS, BLOOD: Phosphorous: 2.7 mg/dL (ref 2.7–4.5)

## 2023-02-14 MED ORDER — GABAPENTIN 250 MG/5ML OR SOLN
300.0000 mg | Freq: Three times a day (TID) | ORAL | 0 refills | Status: DC
Start: 2023-02-14 — End: 2023-02-18

## 2023-02-14 MED ORDER — ACETAMINOPHEN 160 MG/5ML OR SOLN
650.0000 mg | ORAL | 0 refills | Status: DC
Start: 2023-02-14 — End: 2023-02-18

## 2023-02-14 MED ORDER — TAMSULOSIN HCL 0.4 MG PO CAPS
0.4000 mg | ORAL_CAPSULE | Freq: Every day | ORAL | 0 refills | Status: AC
Start: 2023-02-14 — End: 2023-03-16

## 2023-02-14 NOTE — Plan of Care (Signed)
Problem: Promotion of Health and Safety  Goal: Promotion of Health and Safety  Description: The patient remains safe, receives appropriate treatment and achieves optimal outcomes (physically, psychosocially, and spiritually) within the limitations of the disease process by discharge.    Information below is the current care plan.  Outcome: Progressing  Flowsheets  Taken 02/14/2023 0043  Individualized Interventions/Recommendations #1: Monitor BS and adminsiter insulin per sliding scale.  Taken 02/13/2023 1940  Patient /Family stated Goal: to rest  Taken 02/12/2023 0130  Guidelines: Inpatient Nursing Guidelines  Individualized Interventions/Recommendations #2 (if applicable): Monitor pain and adminsiter medication as prescribed  Individualized Interventions/Recommendations #3 (if applicable): Fall precautions in place  Note: Pt A&Ox4, VSS on RA. Pt rated her pain 0/10, no PRN's administered, schedule tylenol. Pt tolerated all meds. BS checked, insulin administered per sliding scale. Fall precautions in place, call light within reach, bed in lowest position, non skid footwear.

## 2023-02-14 NOTE — Plan of Care (Signed)
Problem: Promotion of Health and Safety  Goal: Promotion of Health and Safety  Description: The patient remains safe, receives appropriate treatment and achieves optimal outcomes (physically, psychosocially, and spiritually) within the limitations of the disease process by discharge.    Information below is the current care plan.  Outcome: Progressing  Flowsheets  Taken 02/14/2023 1918 by Channing Mutters, RN  Outcome Evaluation (rationale for progressing/not progressing) every shift: Pt A&Ox4, pain well-controlled with scheduled liquid tylenol. VSS on RA.  BG wdl. Patient with steady and even gait. No episode of falls. Abdominal wound clean and dry with granulation. Wound supplies (gauze, medipore tape, and hepiclens soap) given to patient prior to discharge. Patient verbalize feeling more comfortable with performing her wound care, with supplies received and she had managed a similar wound in the past. Wound photo taken. MD confirmed pt is ok to discharge with wound. Pt discharge home with family.  Taken 02/14/2023 0800 by Channing Mutters, RN  Patient /Family stated Goal: to pee with control  Taken 02/14/2023 0043 by Cathlean Marseilles, RN  Individualized Interventions/Recommendations #1: Monitor BS and adminsiter insulin per sliding scale.  Taken 02/12/2023 0130 by Cathlean Marseilles, RN  Guidelines: Inpatient Nursing Guidelines  Individualized Interventions/Recommendations #2 (if applicable): Monitor pain and adminsiter medication as prescribed  Individualized Interventions/Recommendations #3 (if applicable): Fall precautions in place  Individualized Interventions/Recommendations #4 (if applicable): Monitor I&O and notify MD  Taken 02/11/2023 0247 by Arlean Hopping, RN  Individualized Interventions/Recommendations #5 (if applicable): Encourage to use deep breathing technique  Note:

## 2023-02-14 NOTE — Interdisciplinary (Signed)
Discharge Instructions given to pt. Paperwork given to pt.      Educated pt on the following information:  - Reason for admission  - Medications  - What happen during admission and what to do after discharge  - When to call 911      Pt verbalize and demonstrated understanding by teach back method. Medications to be picked up at discharge pharmacy. Wound care supplies (gauze, medipore tape, and hepiclens soap) given to patient.      IV removed.      Valuables and belongings checked to admission list. Pt left with all belongings. VS stable. Pt denied discomfort. No s/s SOB and/or resp distress.

## 2023-02-14 NOTE — Discharge Summary (Signed)
Date of Admission:  02/10/2023  Date of Discharge:  02/14/2023    Patient Name:  Tiffany Chen    Principal Diagnosis (required):  Chronic GERD, Presence of Ileostomy     Hospital Problem List (required):  Active Hospital Problems    Diagnosis    *Chronic GERD [K21.9]      Resolved Hospital Problems   No resolved problems to display.       Additional Hospital Diagnoses ("rule out" or "suspected" diagnoses, etc.):              Urinary retention    Principal Procedure During This Hospitalization (required):    BRIEF OPERATIVE NOTE      DATE: 02/10/2023  TIME: 11:30 AM     PREOPERATIVE DIAGNOSIS: Diverting Loop Ileostomy, hiatal hernia      POSTOPERATIVE DIAGNOSIS: Diverting Loop Ileostomy, hiatal hernia     PROCEDURE, WOUND CLASSIFICATION & WOUND CLOSURE:   Procedure(s):  Laparoscopic hiatal hernia repair, gastric fundoplication, intraoperative upper endoscopy, possible mesh placement - Wound Class: Class II (Clean Contaminated) - Incision Closure: Deep and Superficial Layers  COMPLEX ILEOSTOMY, TAKE-DOWN - Wound Class: Class III (Contaminated) - Incision Closure: Deep and Superficial Layers     SURGEONS:  Panel 1:   Primary: Dorian Furnace, MD  Resident - Assisting: Arelia Longest, MD     Panel 2:   Primary: Donnalee Curry, MD  Fellow: Kendra Opitz, Darien Ramus, MD   Resident - Assisting: Arelia Longest, MD     ANESTHESIA: General     FINDINGS:   Panel 1:   Takedown of diverting loop ileostomy, ~ 4 cm small bowel resected, side to side stapled anastomosis.      Panel 2:   Hiatal hernia reduced and repaired with crural closure, Nissen fundoplication     SPECIMENS:  ID Type Source Tests Collected by Time Destination   A : Samll bowel resection with ileostomy Biopsy Small bowel PATHOLOGY TISSUE EXAM Donnalee Curry, MD 02/10/2023 [phone]           Fluids/Blood Products:    IV Fluids: 1000    Blood Products: none    EBL: 15 mL    Urine Output: 180 mL     COMPLICATIONS: none     DISPOSITION: PACU to floor         Other Procedures Performed During This Hospitalization (required):  None    Consultations Obtained During This Hospitalization:  Minimally Invasive Surgery    Key consultant recommendations:  - Recommend liquid medications or small pills (less than tip of pencil eraser)  - Recommend full liquid diet once appropriate from colorectal surgery perspective  - Plan for discharge on two weeks of full liquids, followed by two weeks of soft diet.  - Remainder of care per colorectal team    Reason for Admission to the Hospital / History of Present Illness:  Tiffany Chen is a 78 year old female with history of GERD and diverticulitis s/p loop ileostomy creation,  presenting for scheduled gastric fundoplication and ostomy takedown    Hospital Course by Problem (required):  Patient presented to the hospital for scheduled surgery. She underwent an uncomplicated ileostomy reversal, followed by a hiatal hernia repair and Nissen fundoplication. She tolerated surgery well and had no immediate complications. Post-operatively, she recovered well, and her diet was gradually advanced to full liquids. Post-operative course remarkable only for transient urinary retention after peri-operative foley was removed, requiring foley replacement during admission.     Patient was deemed  medically appropriate for discharge on POD4. At the time of discharge, patient was tolerating a full liquid diet, ambulating indepdendenty, voiding spontaneously, and their pain was well controlled on oral pain medications. Patient will follow up in clinic for post-operative visit in two weeks.      Tests Outstanding at Discharge Requiring Follow Up:  None    Discharge Condition (required):  Stable.    Key Physical Exam Findings at Discharge:  Mental Status Exam: Patient is alert and oriented to person, place, time, and situation.  Physical examination is significant for:    Abd soft, appropriately tender, non-distended, incisions c/d/i      Discharge  Diet:  Full Liquids.    Discharge Disposition:  Home.        Discharge Code Status:  Full code / full care  This code status is not changed from the time of admission.    Discharge Medications:     What To Do With Your Medications        START taking these medications        Add'l Info   acetaminophen 160 MG/5ML solution  Commonly known as: TYLENOL  Take 20.3 mL (650 mg) by mouth every 4 hours.   Quantity: 1 each  Refills: 0     gabapentin 250 MG/5ML solution  Commonly known as: NEURONTIN  Take 6 mL (300 mg) by mouth every 8 hours.   Quantity: 300 mL  Refills: 0     tamsulosin 0.4 MG capsule  Commonly known as: FLOMAX  Take 1 capsule (0.4 mg) by mouth daily (with food) for 30 days. When pills run out, ok to stop   Quantity: 30 capsule  Refills: 0            CONTINUE taking these medications        Add'l Info   atorvastatin 20 MG tablet  Commonly known as: LIPITOR  Take 1 tablet (20 mg) by mouth daily.   Quantity: 90 tablet  Refills: 1     biotin 1000 MCG tablet  Take 1 tablet (1,000 mcg) by mouth daily.   Refills: 0     blood glucose meter  PHARMACY: Please dispense insurance approved bluetooth enable meter.   Quantity: 1 each  Refills: 0     ferrous sulfate 325 (65 Fe) MG tablet  Take 1 tablet (325 mg) by mouth daily.   Refills: 0     fluoxetine 40 MG capsule  Commonly known as: PROZAC  Take 1 capsule (40 mg) by mouth daily.   Quantity: 90 capsule  Refills: 3     * glucose blood test strip  1 strip by Other route 2 times daily (before meals).   Quantity: 100 strip  Refills: 11     * glucose blood test strip  1 strip by Other route every morning (before breakfast).   Quantity: 100 strip  Refills: 2     * lancets  1 each by Other route 2 times daily (before meals).   Quantity: 100 Lancet.  Refills: 5     * lancets  1 each by Other route every morning (before breakfast).   Quantity: 100 each  Refills: 2     metFORMIN 500 mg tablet  Commonly known as: GLUCOPHAGE  Take 1 tablet (500 mg) by mouth every morning AND 2  tablets (1,000 mg) every evening.   Quantity: 270 tablet  Refills: 1     multivitamin Tabs tablet  Take 1 tablet by mouth daily.  Refills: 0     omeprazole 20 MG capsule  Commonly known as: PRILOSEC  Take 1 capsule (20 mg) by mouth every morning (before breakfast).   Quantity: 90 capsule  Refills: 3     ramipril 2.5 MG capsule  Commonly known as: ALTACE  Take 1 capsule (2.5 mg) by mouth daily.   Quantity: 90 capsule  Refills: 3     sodium chloride 7 % Nebu  USE 1 VIAL VIA NEBULIZER  EVERY 12 HOURS   Quantity: 720 mL  Refills: 3     umeclidinium-vilanterol 62.5-25 MCG/ACT inhaler  Commonly known as: ANORO ELLIPTA  Inhale 1 puff by mouth daily.   Quantity: 60 each  Refills: 11     Ventolin HFA 108 (90 Base) MCG/ACT inhaler  USE 2 INHALATIONS BY MOUTH  EVERY 6 HOURS AS NEEDED FOR WHEEZING  Generic drug: albuterol   Quantity: 72 g  Refills: 3     VITAMIN B 12 PO   Refills: 0     vitamin D3 125 MCG (5000 UT) capsule  Take 1 capsule (5,000 Units) by mouth every 24 hours.   Refills: 0     vitamin E 400 UNIT capsule  Take 1 capsule (400 Units) by mouth daily.   Refills: 0           * This list has 4 medication(s) that are the same as other medications prescribed for you. Read the directions carefully, and ask your doctor or other care provider to review them with you.                   Where to Get Your Medications        These medications were sent to [facility] [address], [place] - [address]  [address] [place], [place] [address]      Hours: Mon-Fri 8am-8pm, Sat-Sun 9am-5pm Phone: [phone]   acetaminophen 160 MG/5ML solution  gabapentin 250 MG/5ML solution  tamsulosin 0.4 MG capsule         Allergies:  Allergies   Allergen Reactions    Walnuts Swelling     Tongue swollen and burn       Follow Up Appointments:    Scheduled appointments:  Future Appointments   Date Time Provider Department Center   02/28/2023 11:10 AM Broderick, Lewanda Rife, MD UTC MIS [facility] UTC   03/03/2023  9:00 AM [facility] INFUSION,  9TH FLOOR [facility] Onc Chem [place]   04/16/2023 11:30 AM [name], MD [facility] Pulm Gen MOS   05/27/2023 11:00 AM Fredric Mare, DO [facility] Int Med [facility]   06/09/2023  2:00 PM Darliss Ridgel, AuD [facility] [address] [facility]   07/30/2023  3:00 PM Selena Batten Sharman Crate, MD [facility] Allergy [facility]       For appointments requested for after discharge that have not yet been scheduled, refer to the Post Discharge Referrals section of the After Visit Summary.    Discharging Physician's Contact Information:  [facility] operator at [phone].

## 2023-02-17 ENCOUNTER — Other Ambulatory Visit (HOSPITAL_COMMUNITY): Payer: Self-pay | Admitting: Surgery

## 2023-02-17 ENCOUNTER — Encounter (HOSPITAL_COMMUNITY): Payer: Self-pay | Admitting: General Surgery

## 2023-02-17 DIAGNOSIS — G8918 Other acute postprocedural pain: Secondary | ICD-10-CM

## 2023-02-17 NOTE — Op Note (Signed)
DATE OF SERVICE:    02/10/2023    Name:  Tiffany Chen    MRN:  [id number]    DOB:  [birth year]     PREOPERATIVE DIAGNOSIS:  History of open colostomy takedown with low colorectal anastomosis, creation of diverting loop ileostomy      POSTOPERATIVE DIAGNOSIS:  History of open colostomy takedown with low colorectal anastomosis, creation of diverting loop ileostomy     PROCEDURE PERFORMED:  Ileostomy takedown with small bowel resection  and anastomosis.      SURGEON/STAFF:  Dorian Furnace, MD     ESTIMATED BLOOD LOSS:  Minimal.     ANESTHESIA:  General endotracheal anesthesia.     SPECIMENS:  Small bowel resection with ileostomy.     INDICATIONS:  This is a 78 year old female who underwent open colostomy takedown with low colorectal anastomosis, creation of diverting loop ileostomy and ventral hernia repair on 03/25/22.  Patient is doing well postop.  Flexible sigmoidoscopy completed which showed a patent and intact colorectal anastomosis and this was confirmed on gastrografin enema.  The patient would like to proceed with ileostomy takedown        DESCRIPTION OF PROCEDURE:  The risks, benefits, and alternatives of  the operation were discussed with the patient.  The risks included,  but were not limited to, bleeding, wound infection, intraabdominal  infection requiring percutaneous drainage or surgical drainage,  injury to adjacent organs and structures including kidney, ureter,  bladder, small intestine, large intestine, vascular structures,  incisional hernia, bowel obstruction, anastomotic leak requiring  percutaneous drainage or surgical reoperation and stoma creation,  heart attack, stroke, death and anesthesia-related complications.  The patient agreed to the operation and signed the informed consent.  Patient was taken to the operating room and placed in the supine  position on the operating room table.  SCDs were placed on bilateral  lower extremities and machine was turned on.  A peripheral IV  was  inserted by Anesthesia followed by IV sedation and induction   medications and successful smooth endotracheal intubation.  A Foley  catheter was placed under sterile conditions draining clear yellow  urine.  The abdomen was then prepped and draped in normal sterile  fashion.  A time-out was performed identifying the patient by name,  date of birth, MR number, and procedure to be performed.  Patient  received IV antibiotics, heparin subcu, and Entereg before incision.  A circumferential incision at the mucocutaneous junction was  performed sharply.  Dissection through the subcutaneous tissue down  to the level of the fascia was done sharply with Metzenbaum scissors.   The fascial adhesions to the ileostomy were taken down sharply and  were minimal.  Both limbs of the ileostomy were freely mobile.  Mesenteric windows were created on either side of the ileostomy, and  the intervening mesentery was ligated and divided with a LigaSure  device.  The limbs were aligned in parallel in anti-peristaltic manner with stay sutures, and  antimesenteric enterotomies were made.  An Echelon blue load 60 mm blue load  stapler x 2 was placed, closed and fired, creating a longitudinal  staple line which was irrigated and visualized and found to be  hemostatic.  The common enterotomy was then reapproximated with Allis  clamps, and a GIA 75 stapler was placed across, closed and fired,  resecting the specimen and creating a transverse staple line which  was reinforced with medium clips.  The anastomosis was palpated and  found to be greater than 3  fingerbreadths.  Both limbs of the  anastomosis were pink and viable, and there were visible and palpable  pulsatile vessels on either side of the anastomosis.  The mesenteric  defect was then closed with running 3-0 Vicryl suture.  Anastomosis  was then placed back into the abdomen.  A Alexis lap cap was placed into the fascial defect and the abdomen safely insufflated to 15 mmHg.   Laparoscopic adhesiolysis was performed, successfully taking down the omental adhesions adherent to the anterior abdominal wall.  This allowed adequate visualization of the upper abdomen for Dr. Dalbert Garnet portion of the case.  Hemostasis was  excellent.  All instrument counts  and sponge counts were reported as correct at the end of the case.  The patient tolerated the procedure well, was extubated in the  operating room, and transferred to the PACU in stable condition.

## 2023-02-18 ENCOUNTER — Ambulatory Visit (HOSPITAL_BASED_OUTPATIENT_CLINIC_OR_DEPARTMENT_OTHER): Payer: Medicare Other | Admitting: General Surgery

## 2023-02-18 ENCOUNTER — Other Ambulatory Visit (HOSPITAL_BASED_OUTPATIENT_CLINIC_OR_DEPARTMENT_OTHER): Payer: Self-pay | Admitting: Surgery

## 2023-02-18 DIAGNOSIS — G8918 Other acute postprocedural pain: Secondary | ICD-10-CM

## 2023-02-19 ENCOUNTER — Encounter (INDEPENDENT_AMBULATORY_CARE_PROVIDER_SITE_OTHER): Payer: Self-pay | Admitting: Internal Medicine

## 2023-02-19 DIAGNOSIS — E119 Type 2 diabetes mellitus without complications: Secondary | ICD-10-CM

## 2023-02-19 MED ORDER — GABAPENTIN 250 MG/5ML OR SOLN
300.0000 mg | Freq: Three times a day (TID) | ORAL | 0 refills | Status: AC
Start: 2023-02-19 — End: ?

## 2023-02-19 NOTE — Telephone Encounter (Signed)
Per chart patient received new order/prescription yesterday on 02/11 and order sent to pharmacy - [facility]

## 2023-02-20 MED ORDER — METFORMIN HCL 500 MG/5ML OR SOLN
ORAL | 0 refills | Status: DC
Start: 2023-02-20 — End: 2023-07-09

## 2023-02-20 NOTE — Telephone Encounter (Signed)
I have sent a prescription for liquid metformin to pharmacy to be taken while she is on liquid diet. She can switch back to metformin tablets when able to tolerate regular diet.  Continue to monitor blood sugars at home.  Let us know if any other concerns.     Roque Cash, Nurse Practitioner    QB NP

## 2023-02-20 NOTE — Telephone Encounter (Signed)
Loaded Just in case ok for refill.    Medication requested:   Requested Prescriptions     Pending Prescriptions Disp Refills    metFORMIN (GLUCOPHAGE) 500 mg tablet 270 tablet 1     Sig: Take 1 tablet (500 mg) by mouth every morning AND 2 tablets (1,000 mg) every evening.       Date of last refill: 09/03/22    Last OV (provider):      01/23/2023  Last OV (department): 01/23/2023    Next OV (provider):      05/27/2023  Next OV (department): 05/27/2023

## 2023-02-23 ENCOUNTER — Other Ambulatory Visit (HOSPITAL_COMMUNITY): Payer: Self-pay | Admitting: Surgery

## 2023-02-23 DIAGNOSIS — G8918 Other acute postprocedural pain: Secondary | ICD-10-CM

## 2023-02-24 ENCOUNTER — Other Ambulatory Visit: Payer: Self-pay

## 2023-02-24 ENCOUNTER — Emergency Department (HOSPITAL_BASED_OUTPATIENT_CLINIC_OR_DEPARTMENT_OTHER): Payer: Medicare Other

## 2023-02-24 ENCOUNTER — Emergency Department
Admission: EM | Admit: 2023-02-24 | Discharge: 2023-02-24 | Disposition: A | Discharge status: Home / Self Care | Payer: Medicare Other | Attending: Emergency Medicine | Admitting: Emergency Medicine

## 2023-02-24 DIAGNOSIS — I82461 Acute embolism and thrombosis of right calf muscular vein: Secondary | ICD-10-CM | POA: Insufficient documentation

## 2023-02-24 DIAGNOSIS — M25471 Effusion, right ankle: Secondary | ICD-10-CM

## 2023-02-24 DIAGNOSIS — R2241 Localized swelling, mass and lump, right lower limb: Secondary | ICD-10-CM

## 2023-02-24 DIAGNOSIS — I824Z1 Acute embolism and thrombosis of unspecified deep veins of right distal lower extremity: Secondary | ICD-10-CM

## 2023-02-24 DIAGNOSIS — Z9889 Other specified postprocedural states: Secondary | ICD-10-CM | POA: Insufficient documentation

## 2023-02-24 LAB — CBC WITH DIFF, BLOOD
ANC-Automated: 5.8 10*3/uL (ref 1.6–7.0)
Abs Basophils: 0.1 10*3/uL (ref <0.2)
Abs Eosinophils: 0.2 10*3/uL (ref 0.0–0.5)
Abs Lymphs: 0.9 10*3/uL (ref 0.8–3.1)
Abs Monos: 0.5 10*3/uL (ref 0.2–0.8)
Basophils: 0.7 %
Eosinophils: 2.3 %
Hct: 31.3 % — ABNORMAL LOW (ref 34.0–45.0)
Hgb: 10.2 g/dL — ABNORMAL LOW (ref 11.2–15.7)
Imm Gran %: 0.3 % (ref <1)
Imm Gran Abs: 0 10*3/uL (ref <0.1)
Lymphocytes: 12.4 %
MCH: 30.4 pg (ref 26.0–32.0)
MCHC: 32.6 g/dL (ref 32.0–36.0)
MCV: 93.2 um3 (ref 79.0–95.0)
MPV: 9.1 fL — ABNORMAL LOW (ref 9.4–12.4)
Monocytes: 6.7 %
Plt Count: 369 10*3/uL (ref 140–370)
RBC: 3.36 10*6/uL — ABNORMAL LOW (ref 3.90–5.20)
RDW: 14 % (ref 12.0–14.0)
Segs: 77.6 %
WBC: 7.4 10*3/uL (ref 4.0–10.0)

## 2023-02-24 LAB — BASIC METABOLIC PANEL, BLOOD
Anion Gap: 14 mmol/L (ref 7–15)
BUN: 10 mg/dL (ref 8–23)
Bicarbonate: 21 mmol/L — ABNORMAL LOW (ref 22–29)
Calcium: 8.4 mg/dL — ABNORMAL LOW (ref 8.5–10.6)
Chloride: 104 mmol/L (ref 98–107)
Creatinine: 0.79 mg/dL (ref 0.51–0.95)
Glucose: 127 mg/dL — ABNORMAL HIGH (ref 70–99)
Potassium: 4.6 mmol/L (ref 3.5–5.1)
Sodium: 139 mmol/L (ref 136–145)
eGFR Based on CKD-EPI 2021 Equation: >60 mL/min/{1.73_m2}

## 2023-02-24 LAB — PROTHROMBIN TIME, BLOOD
INR: 1
PT,Patient: 11.3 s (ref 9.7–12.5)

## 2023-02-24 LAB — APTT, BLOOD: PTT: 32 s (ref 27–36)

## 2023-02-24 MED ORDER — APIXABAN 5 MG PO TABS
5.0000 mg | ORAL_TABLET | Freq: Two times a day (BID) | ORAL | 0 refills | Status: DC
Start: 2023-03-26 — End: 2023-02-24
  Filled 2023-02-24: qty 60, 30d supply, fill #0

## 2023-02-24 MED ORDER — APIXABAN STARTER PACK 5 MG PO TBPK
ORAL_TABLET | ORAL | 0 refills | Status: AC
Start: 2023-02-24 — End: 2023-03-26
  Filled 2023-02-24 (×2): qty 74, 30d supply, fill #0

## 2023-02-24 MED ORDER — APIXABAN 5 MG PO TABS
ORAL_TABLET | ORAL | 0 refills | Status: AC
Start: 2023-02-24 — End: ?
  Filled 2023-02-24: qty 74, 30d supply, fill #0

## 2023-02-24 MED ORDER — APIXABAN 5 MG PO TABS
ORAL_TABLET | ORAL | 0 refills | Status: AC
Start: 2023-02-24 — End: ?
  Filled 2023-02-24: qty 60, 30d supply, fill #0

## 2023-02-24 NOTE — ED Geriatric Nursing Note (Signed)
 Chief Complaint   Tiffany Chen is a 78 year old year old female that presented to the Emergency Department today with the chief complaint of: Leg Swelling (She states that she has had 1 days of R leg swelling, she recently had surgery here at [facility] 2 weeks ago. 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 She has never used smokeless tobacco. She reports current alcohol use. She reports that she does not currently use drugs.   Emergency Contact   Extended Emergency Contact Information  Primary Emergency Contact: lynn,james  Address: [address]           [place], [place] [address] [place]  Home Phone: [phone]  Mobile Phone: [phone]  Relation: BROTHER  Hearing or visual needs: None  Other needs: None  Preferred language: English  Interpreter needed? No  Secondary Emergency Contact: Lynn,Mary  Address:  [address]           [place], [place] [address] [place]  Home Phone: [phone]  Mobile Phone: [phone]  Relation: SISTER  Hearing or visual needs: None  Other needs: None  Preferred language: English  Interpreter needed? No   PCP   [place], Tejpreet Kaur - [phone]   Consent   Aljean Macario Harpin gave verbal consent to comprehensive geriatric assessment. The Emergency Department Attending Provider is aware of geriatric screening.   Interpreter   Do you need interpreter services for learning information about your medical care?: No  Is the patient's preferred language updated in Patient Storyboard?: Yes   Barriers to Learning   Does the patient/guardian have any barriers to learning?: No Barriers  Will there be a co-learner?: No     Their ISAR (Identify Seniors At Risk) score today was 2.    A score of 2 or > means if they are discharged home, it could result in a poor outcome.       The framework guides the geriatric nursing assessment: Mobility, Mentation,  Medications, what Matters.     Home Environment    Who do you live with? Alone,    Do you receive any home services [facility], Meals on Wheels, etc.)?   No   Do you receive any other support (Family, Church, Friends)? Family, friends   Type of Residence: Apartment,    Are there stairs  to get into your home? Yes    GENIE Notes: Slope up to home & 5-6 steps into house   Suspicion of Abuse    Elder Abuse Suspicion Index (EASI)       1) Relied on others for ADLs/IADLs: No    2) Social/Sensory/Medical Isolation:    3) Verbal Abuse:    4) Financial Abuse:    5) Physical Abuse:    6) Physical Appearance/Mannerism & Communication Concerns:  No      A response of YES on one or more of questions 2-6 should raise concerns about mistreatment  A positive EASI should be reported to APS and/or Child Psychotherapist.    EASI Referral Provided: No             : Mobility    Do you use any assistive devices?    None           History of fall:     Cristopher Moats Fall Scale Score          GUG Score    Consider a Physical Therapy Consult for a patient with a GUG Score of 3 or higher. 1 - Normal    Referral Provided: No           Patient provided with the following assistive devices in the ED today: None,     KATZ ADLs Activities of Daily Living   Bathing 1 - Independence   Dressing 1 - Independence   Toileting 1 - Independence   Transferring 1 - Independence   Continence 1 - Independence   Feeding 1 - Independence   KATZ Total     (Score of 3 or less is Abnormal) 6    Referral Provided: No             [place] IADL's Instrumental Activities of Daily Living   Ability to Use Telephone:   1-Operates telephone on own initiative, looks up and dials numbers   Shopping:   1-Takes care of all shopping needs independently   Food Preparation:   1-Plans, prepares, and serves adequate meals independently   Housekeeping:   1-Maintains house alone with occasion assistance (heavy work)   Pharmacologist:   1-Does engineer, manufacturing completely   Mode of Transportation:   1-Travels independently on public transportation or drives own car   Responsible for Own Medications:   1-Is responsible for taking medication in correct dosages at correct time   Ability to [employer]:  1-Manages financial matters independently universal health, writes checks, pays rent and bills, goes to bank), collects and keeps track of income   [place] IADL Score:     (Score of 4 or less is Abnormal) 8               Caregiver Strain    Caregiver Strain Score               Caregiver Gender        : Medications    BEERS Medication(s)          : Mentation    Dementia: AMT4 Score  Abbreviated Mental Test 4     A score of <4 suggests possible cognitive impairment 4                Alzheimer's: BAS  Brief Alzheimer's Screen    A score of < or equal to 23 suggests possible cognitive impairment (S) Patient refuses BAS (Not needed)       BAS Referral  Placed: N/A            Delirium: UB-2  2-Item Ultra-Brief (UB-2) Delirium Screen                If both questions answered correctly, patient is not delirious.  If incorrect on either question, use an additional screening tool to further assess, such as the CAM-ICU.     Delirium: CAM-ICU  Confusion Assessment Method for the Intensive Care Unit    If features 1 and 2 are both present and either features 3 or 4 are present: CAM-ICU is positive, delirium is present.     1 - Acute Onset /Fluct. Course: No             Negative   Delirium: DOS  Delirium Observation Screening Scale     1    A score of 3 or higher is considered probably delirious   Genie Interventions (if Delirium positive):           Depression: PHQ2 & PHQ9  Patient Health Questionnaire    PHQ2:A score of 3+ suggests possible depressive disorder.    PHQ9:   Total Score               Depression Severity        1-4                       Minimal depression        5-9                       Mild depression       10-14                    Moderate depression       15-19                    Mod/severe depress       20-27                    Severe depression        PHQ2 Total: 0                        Nutrition    Last six (6) documented weights         02/24/2023    10:46 AM 02/10/2023     7:03 PM 02/10/2023     6:09 AM 02/03/2023     8:55 AM 01/29/2023     2:32 PM 01/23/2023    11:45 AM   Date Weight Recorded   Metric 72.598 kg 72.6 kg 68.312 kg 70.897 kg 68.04 kg 68.947 kg   Pounds/Ounces 160 lb 0.8 oz 160 lb 0.9 oz 150 lb 9.6 oz 156 lb 4.8 oz 150 lb 152 lb      MNA Score  Mini Nutritional Assessment    Consider a Nutrition Consult when MNA Score < 11 14    Referral Provided: No           : What Matters Most    Example:   What matters most to you while you are in the hospital today?   What matters most to you in your everyday life? Being with family   Palliative Performance Scale   (PPSv2)               Disposition Data Unavailable   Referrals Today:  PCP or designated follow-up Provider Updated/Contacted? no - n/a     GENIE Communication   Ms Sawhney  is a pleasant 78 year old female, 2/3 had ileostomy takedown & esophageal hernia repair, been doing well at home, just noted some swelling to RLE this AM. Pt retired at 52 & spends her time with her sister, sister-n-law & niece, she also has a bird Parrot that she has had for 38-years & feed local birds on a daily basis. Pt very independent and has no needs at this time. Provided her with [employer] book.     Screened by   Geriatric Emergency Nurse Initiative Expert (GENIE)  Camie Ou, RN  [facility]  [facility] EMERGENCY DEPT  02/24/2023 2:11 PM

## 2023-02-24 NOTE — ED Provider Notes (Signed)
 ED Provider Note  [place] electronic medical record reviewed for pertinent medical history.     Tiffany Chen DOB: 1945/08/02 PMD: Rosalene Hedda Aurora     Chief Complaint   Patient presents with   . Leg Swelling     She states that she has had 1 days of R leg swelling, she recently had surgery here at [facility] 2 weeks ago.        HPI: Tiffany Chen is a 78 year old female with a history of GERD, diverticulitis, status post colostomy creation in 2009, recently admitted to  surgery for colostomy takedown, status post he had a hernia repair, discharged with the plan for 2 full weeks of liquid diet 10 days ago now presented to the emergency room for right leg swelling. patient reports that she does not have any significant pain or new trauma to the area, reports that this morning when attempted to wear the shoes, reports that her ankle is significantly swollen than the left side, denies any history of cardiac problems, currently denies any shortness a breath, chest pain, pain in the ankle or the knee. able to ambulate, able to weightbear.          External Data Sources (Select all that apply):      Pertinent Medical History:    PMHx: As above    Past Surgical History:   Procedure Laterality Date   . BRONCHOSCOPY  12/2020   . BRAIN SURGERY  12, 2021   . CESAREAN SECTION, LOW TRANSVERSE     . COLON SURGERY  2009   . COLOSTOMY     . CRANIOTOMY     . LUNG LOBECTOMY         Family History   Problem Relation Name Age of Onset   . Cancer Mother Jenkins Macario Dutton ngs and throat   . Heart Disease Mother Jenkins Macario         Congrestive heart failure due to cancer   . Hypertension Mother Jenkins Macario    . Psychiatry Mother Jenkins Macario         Bi polar, manic depressive   . Cancer Father Elsie Macario         Prostate   . Diabetes Father Elsie Macario    . Stroke Father Elsie Macario    . Stroke M Grandmother Winifred Macario    . Cancer Brother Lynwood Macario         Skin (many locations)   . Diabetes Brother Lynwood Macario        Current  Outpatient Medications   Medication Instructions   . Acetaminophen  Childrens 32 mg/mL SOLN Take 20.3 mL (650 mg) by mouth every 4 hours.   . atorvastatin  (LIPITOR) 20 mg, Oral, DAILY   . biotin  1,000 mcg, Oral, DAILY   . blood glucose meter PHARMACY: Please dispense insurance approved bluetooth enable meter.   . Cyanocobalamin (VITAMIN B 12 PO) No dose, route, or frequency recorded.   . ferrous sulfate  325 mg, Oral, DAILY   . fluoxetine  (PROZAC ) 40 mg, Oral, DAILY   . gabapentin  (NEURONTIN ) 300 mg, Oral, EVERY 8 HOURS   . glucose blood test strip 1 strip, Other, 2 TIMES DAILY BEFORE MEALS   . glucose blood test strip 1 strip, Other, DAILY BEFORE BREAKFAST   . lancets 1 each, Other, 2 TIMES DAILY BEFORE MEALS   . lancets 1 each, Other, DAILY BEFORE BREAKFAST   .  metFORMIN  (GLUCOPHAGE ) 500 mg tablet Take 1 tablet (500 mg) by mouth every morning AND 2 tablets (1,000 mg) every evening.   . metFORMIN  (RIOMET ) 500 MG/5ML oral solution Take 5 mL in the morning and 10 mL in the evening.   . Multiple Vitamin (MULTIVITAMIN) TABS tablet 1 tablet, Oral, DAILY   . omeprazole  (PRILOSEC) 20 mg, Oral, DAILY BEFORE BREAKFAST   . ramipril  (ALTACE ) 2.5 mg, Oral, DAILY   . sodium chloride  7 % NEBU USE 1 VIAL VIA NEBULIZER  EVERY 12 HOURS   . tamsulosin  (FLOMAX ) 0.4 mg, Oral, DAILY WITH FOOD, When pills run out, ok to stop   . umeclidinium-vilanterol (ANORO ELLIPTA ) 62.5-25 MCG/ACT inhaler 1 puff, Inhalation, DAILY   . VENTOLIN  HFA 108 (90 Base) MCG/ACT inhaler USE 2 INHALATIONS BY MOUTH  EVERY 6 HOURS AS NEEDED FOR WHEEZING   . vitamin D3 125 MCG (5000 UT) capsule 1 capsule, Oral, EVERY 24 HOURS NON ROUTINE   . vitamin E  400 Units, Oral, DAILY       Physical Exam  BP 166/72   Pulse 73   Temp 97.6 F (36.4 C)   Resp 18   Ht 5' 3 (1.6 m)   Wt 72.6 kg (160 lb 0.8 oz)   SpO2 97%   BMI 28.35 kg/m   Physical Exam  GENERAL APPEARANCE: AxOx4, slightly fatigued, well appearing, no acute distress.  HEENT:  AT. MMM. EOMI, clear  conjunctiva, oropharynx clear.  NECK: Supple without lymphadenopathy. No stiffness or restricted ROM.  HEART: rate per vital, unable to appreciate murmur   LUNGS: CTAB, moving air well. No crackles or wheezes are heard.  ABDOMEN: Soft, nontender, nondistended with good bowel sounds heard.  BACK: No CVAT, no obvious deformity.  EXTREMITIES: Compared to the left, right lower extremity ankle swelling appreciated tracing up to the mid lower leg. Good cap refill, pulse appreciated.  NEUROLOGICAL: Grossly nonfocal. Alert and oriented, moving all 4 extremities. CN not formally tested but appear grossly intact. Observed to ambulate with normal gait.  Skin: Warm and dry without any rash.      Orders/Medications    Orders Placed This Encounter   Procedures   . US  Lower Extremity Venous Duplex Ltd - Right   . X-Ray Ankle Complete Minimum 3 Views - Right   . X-Ray Tibia & Fibula 2 Views - Right   . Basic Metabolic Panel, Blood Green Plasma Separator Tube   . CBC w/ Diff Lavender   . aPTT, Blood Blue   . Prothrombin Time, Blood Blue       Medications - No data to display        Medical Decision Making/Assessment/Plan    This is a(n) 78 year old female who has a past medical history of Acquired tracheobronchomegaly with bronchiectasis (CMS-HCC) (05/22/2020), Age-related nuclear cataract of left eye (10/15/2021), Cavitary lesion of lung (07/24/2020), Chronic obstructive airway disease (CMS-HCC) (2018), Combined forms of age-related cataract of both eyes (10/15/2021), Depression, DM (diabetes mellitus) (CMS-HCC), Gastroesophageal reflux disease, unspecified whether esophagitis present (10/12/2020), 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 and presents with right lower extremity swelling that has painless, 10 days postop, been having increasing rest at home, with the findings concern for a deep vein thrombosis, ultrasound study revealed a concern of nonocclusive  calf vein thrombosis, plan at this time, is to touch base with surgery to ensure that patient can be safely anticoagulated prior to starting [facility]. Please see ED course and update for further planning. X-ray obtained overall reassuring for acute osteo involvement. No any significant skin color changes concern for cellulitis, good range of motion of the joint without any significant tenderness limited range of motion, less concern for septic arthritis. Otherwise, the swelling appeared to be uniform, now isolated to the joint alone, less concern for isolated joint inflammatory changes.      ED Course/Updates/Disposition  ED Course as of 02/24/23 1705   Harvy Riera's Documentation   Mon Feb 24, 2023   1258 US  Lower Extremity Venous Duplex Ltd - Right  IMPRESSION:  Nonocclusive thrombus in the right calf vein. No extension into the popliteal vein.     1343 X-Ray Tibia & Fibula 2 Views - Right  IMPRESSION:  No acute osseous injury.     1343 Patient discussed it with surgery, surgery will evaluate the patient and give recommendation on permission for anticoagulation initiation.   1343 Management signed out to the upcoming team, please see ED course for updates.       Others' Documentation   Mon Feb 24, 2023   1416 SO LW: 68F hx ileostomy takedown and hiatal hernia repair 10d ago, now w RLE DVT  - f/u surgery recs re: safe to start [facility]? [MK]   1704 Per surgery, patient is cleared to start anticoagulation. Dosing discussed with pharmacy and we will prescribe apixaban  for DVT treatment, patient is able to crush pills and swallow them. Advised to follow up with PCP and strict return precautions given, also provided him a clinic follow up for  anticoagulation management. [MK]      ED Course User Index  [MK] Eduardo Duwaine Norris, MD         ED Clinical Impression as of 02/24/23 1705   Acute deep vein thrombosis (DVT) of calf muscle vein of right lower extremity (CMS-HCC)   Localized swelling of right lower leg            CMS-HCC/Risk Adjustment Factor Diagnoses       Needs Recertification        Category and Diagnosis From    CMS-HCC 18:  Hyperlipidemia associated with type 2 diabetes mellitus (CMS-HCC) Rosalene Hedda Aurora, DO on 12/26/2022    CMS-HCC 59:  Recurrent depression (CMS-HCC) Rosalene Hedda Aurora, DO on 12/26/2022    CMS-HCC 111:  Emphysema, unspecified (CMS-HCC) Tyrone Oneil ORN, MD on 03/20/2021    CMS-HCC 188:  Ileostomy status (CMS-HCC) Rosalene Hedda Aurora, DO on 12/26/2022                               Data Reviewed:      Limited Bedside Ultrasound Procedure:   All images archived on [facility] servers.        Risk of Complications and/or Morbidity:                 Cesario Kennedy, MD  Resident  02/24/23 1331       Seltzer, Eva Barter, MD  03/03/23 [phone]

## 2023-02-24 NOTE — Consults (Signed)
 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    She reports she has been recovering well since surgery. Pain has been well-controlled, has been tolerating liquids as directed. This morning she noticed significant swelling in her right foot and was unable to fit in her shoes. Given this she presented to the ED for further evaluation.  Denies any fevers, chills, nausea, or vomiting. No redness or erythema.     Review of Systems  A complete 12 point review of systems was performed; apart from the pertinent positives noted in the HPI,  patient denies any changes in mood, new rashes or lesions, recent fevers, chills, dizziness, unintentional weight loss, difficulty swallowing,  chest pain, syncope, dyspnea, abdominal pain, joint pain, and changes in bowel or bladder habits.     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)     s/p Robotic right upper lobectomy with  en bloc lower and middle lobe wedge resection, middle lobe wedge resection 03/20/2021    03/20/21 Robotic right upper lobectomy with en bloc lower and middle lobe wedge resection, middle lobe wedge resection ([facility])       PSH:  Past Surgical History:   Procedure Laterality Date    BRONCHOSCOPY  12/2020    BRAIN SURGERY  12, 2019-12-20    CESAREAN SECTION, LOW TRANSVERSE      COLON SURGERY  12/20/07    COLOSTOMY      CRANIOTOMY      LUNG LOBECTOMY         SH:  Socioeconomic History    Marital status: Widowed   Tobacco Use    Smoking status: Never    Smokeless tobacco: Never   Vaping Use    Vaping status: Never Used   Substance and Sexual Activity    Alcohol use: Yes     Comment: glass of wine every other month    Drug use: Not Currently    Sexual activity: Not Currently   Social Activities of Daily Living Present    Military Service No    Blood Transfusions No    Caffeine Concern No    Occupational Exposure No    Hobby Hazards Yes     Comment: Gardening, MAC    Sleep Concern No    Stress Concern Yes     Comment: Husband death Cov17, 20-Dec-2018, sold home, rehomed pets. [place]    Weight Concern No    Special Diet Yes  Back Care No    Exercises Regularly Yes    Bike Helmet Use No    Seat Belt Use No    Performs Self-Exams No   Social History Narrative    Lives with brother Lynwood at home. Sister also lives in [place]       FH:  Family History   Problem Relation Name Age of Onset    Cancer Mother Jenkins Macario Dutton ngs and throat    Heart Disease Mother Jenkins Macario         Congrestive heart failure due to cancer    Hypertension Mother Jenkins Macario     Psychiatry Mother Jenkins Macario         Bi polar, manic depressive    Cancer Father Elsie Macario         Prostate    Diabetes Father Elsie Macario     Stroke Father Elsie Macario     Stroke M Grandmother Winifred Macario     Cancer Brother Lynwood Macario         Skin (many locations)    Diabetes Brother Lynwood Macario        Medications:   Acetaminophen  Childrens Take 20.3 mL (650 mg) by mouth every 4  hours. 120 mL 0    atorvastatin  Take 1 tablet (20 mg) by mouth daily. 90 tablet 1    biotin  Take 1 tablet (1,000 mcg) by mouth daily.      blood glucose PHARMACY: Please dispense insurance approved bluetooth enable meter. 1 each 0    Cyanocobalamin (VITAMIN B 12 PO)       ferrous sulfate  Take 1 tablet (325 mg) by mouth daily.      fluoxetine  Take 1 capsule (40 mg) by mouth daily. 90 capsule 3    gabapentin  Take 6 mL (300 mg) by mouth every 8 hours. 300 mL 0    glucose blood 1 strip by Other route every morning (before breakfast). 100 strip 2    glucose blood 1 strip by Other route 2 times daily (before meals). 100 strip 11    lancets 1 each by Other route every morning (before breakfast). 100 each 2    lancets 1 each by Other route 2 times daily (before meals). 100 Lancet. 5    metFORMIN  Take 1 tablet (500 mg) by mouth every morning AND 2 tablets (1,000 mg) every evening. 270 tablet 1    metFORMIN  Take 5 mL in the morning and 10 mL in the evening. 473 mL 0    multivitamin Take 1 tablet by mouth daily.      omeprazole  Take 1 capsule (20 mg) by mouth every morning (before breakfast). 90 capsule 3    ramipril  Take 1 capsule (2.5 mg) by mouth daily. 90 capsule 3    sodium chloride  USE 1 VIAL VIA NEBULIZER  EVERY 12 HOURS 720 mL 3    tamsulosin  Take 1 capsule (0.4 mg) by mouth daily (with food) for 30 days. When pills run out, ok to stop 30 capsule 0    umeclidinium-vilanterol Inhale 1 puff by mouth daily. 60 each 11    Ventolin  HFA USE 2 INHALATIONS BY MOUTH  EVERY 6 HOURS AS NEEDED FOR WHEEZING 72 g 3    vitamin D3 Take 1 capsule (5,000 Units) by mouth every 24 hours.      vitamin E  Take 1 capsule (400 Units) by mouth daily.  Allergies:  Allergies   Allergen Reactions    Walnuts Swelling     Tongue swollen and burn         Exam:   BP 166/72   Pulse 73   Temp 97.6 F (36.4 C)   Resp 18   Ht 5' 3 (1.6 m)   Wt 72.6 kg (160 lb 0.8 oz)   SpO2 97%   BMI 28.35 kg/m   NAD, awake, alert, oriented  Regular rate  and rhythm  Non-labored breathing on room air  Abdomen soft, non-distended, non-tender, incisions clean, dry, and intact. Ostomy site granulating well.  Extremities warm, well-perfused  Skin warm and dry    Labs:  Lab Results   Component Value Date    WBC 7.4 02/24/2023    HGB 10.2 (L) 02/24/2023    HCT 31.3 (L) 02/24/2023    PLT 369 02/24/2023     Lab Results   Component Value Date    INR 1.0 02/24/2023    PTT 32 02/24/2023     Lab Results   Component Value Date    NA 139 02/24/2023    K 4.6 02/24/2023    CL 104 02/24/2023    BICARB 21 (L) 02/24/2023    BUN 10 02/24/2023    CREAT 0.79 02/24/2023    GLU 127 (H) 02/24/2023    CA 8.4 (L) 02/24/2023    MG 1.8 02/14/2023    PHOS 2.7 02/14/2023       Imaging:  X-Ray Ankle Complete Minimum 3 Views - Right    Result Date: 02/24/2023  IMPRESSION: No acute osseous injury.    X-Ray Tibia & Fibula 2 Views - Right    Result Date: 02/24/2023  IMPRESSION: No acute osseous injury.    US  Lower Extremity Venous Duplex Ltd - Right    Result Date: 02/24/2023  IMPRESSION: Nonocclusive thrombus in the right calf vein. No extension into the popliteal vein.        Assessment & Recommendations:  78 year old female s/p ileostomy takedown and Nissen with new right calf DVT.    - Agree with anticoagulation  - Hematology referral  - Follow-up with [facility] 02/28/2023 as scheduled.  - Follow-up with Colorectal for wound check    Discussed with attending Dr. Estella and [facility] Fellow Dr. Jackson.    --    Meg Niemeier K. Frutoso, MD  R4/PGY-7, General Surgery

## 2023-02-24 NOTE — Discharge Instructions (Addendum)
-  Your vitals, physical examination, work ups(if any) were reassuring today that you do not have a life threatening medical illness.    -Follow up with your DVT clinic     -Continue to take all your prescribed medications as instructed  Take apixaban  (Eliquis ) 10 mg twice per day for 7 days, then take 5 mg twice per day    -Return to the Emergency Department for any new or concerning symptoms, severe headache, worsening neck pain, numbness/tingling/weakness in your upper or lower extremities or your butt/genital area, difficulty walking, worsening chest pain, difficulty breathing, high fever, severe abdominal pain, uncontrollable bleeding or vomiting, inability to urinary or defecting.

## 2023-02-24 NOTE — ED Notes (Signed)
Pt neuro intact, speaking in full complete sentences, nad, ambulatory with steady gait unassisted, walked to d/c pharmacy with sister, verbalizes understanding for AVS.

## 2023-02-25 ENCOUNTER — Ambulatory Visit: Payer: Medicare Other | Admitting: Student in an Organized Health Care Education/Training Program

## 2023-02-25 ENCOUNTER — Other Ambulatory Visit: Payer: Self-pay

## 2023-02-26 ENCOUNTER — Telehealth (INDEPENDENT_AMBULATORY_CARE_PROVIDER_SITE_OTHER): Payer: Self-pay

## 2023-02-26 NOTE — Telephone Encounter (Signed)
Received call from patient regarding referral being received , informed patient referral has been sent for review once reviewed will cb.

## 2023-02-26 NOTE — Telephone Encounter (Signed)
Incoming Referral    Referring Provider: Seltzer,justin    Referral Status: STAT    DX; Acute deep vein thrombosis    Insurance Provider: Medicare A & B    Note: Dr. Cain Sieve please review

## 2023-02-27 ENCOUNTER — Encounter (INDEPENDENT_AMBULATORY_CARE_PROVIDER_SITE_OTHER): Payer: Self-pay | Admitting: Hospital

## 2023-02-28 ENCOUNTER — Ambulatory Visit (INDEPENDENT_AMBULATORY_CARE_PROVIDER_SITE_OTHER): Payer: Medicare Other | Admitting: General Surgery

## 2023-02-28 VITALS — BP 135/51 | HR 77 | Temp 97.6°F | Resp 20 | Ht 63.0 in | Wt 158.8 lb

## 2023-02-28 DIAGNOSIS — Z09 Encounter for follow-up examination after completed treatment for conditions other than malignant neoplasm: Secondary | ICD-10-CM

## 2023-02-28 DIAGNOSIS — R197 Diarrhea, unspecified: Secondary | ICD-10-CM

## 2023-02-28 NOTE — Progress Notes (Unsigned)
 Laparoscopic Nissen Fundoplication, Heller Myotomy, Paraesophageal Hernia Repair Post Operative Visit      Subjective: Tiffany Chen is a 78 year old female who returns today  2 weeks weeks following a Laparoscopic Nissen Fundoplication, Paraesophageal Hernia Repair. Currently rates her pain at {NUMBERS; 1-12 [12068]}.  She denies fevers, chills, nausea, vomiting, diarrhea or constipation.  She denies dysphagia, reflux, heartburn.    Having diarrhea x3 weeks.  Metamucil    DIETARY INTAKE:  Breakfast:  Lunch:  Snack:  Dinner:    ACTIVITY LEVEL:  Walks *** minutes /day    MEDICATIONS:  Current Outpatient Medications on File Prior to Visit   Medication Sig Dispense Refill    Acetaminophen Childrens 32 mg/mL SOLN Take 20.3 mL (650 mg) by mouth every 4 hours. 120 mL 0    apixaban (ELIQUIS DVT/PE 30-DAY STARTER PACK) 5 MG tablet Take 2 tablets (10 mg) by mouth 2 times daily for 7 days, THEN 1 tablet (5 mg) 2 times daily for 23 days. Then continue with the next 5 mg prescription. 74 tablet 0    [DISCONTINUED] apixaban (ELIQUIS) 5 MG TABS Take 1 tablet (5 mg) by mouth 2 times daily. 60 tablet 0    apixaban (ELIQUIS) 5 MG TABS Take 2 tablets (10 mg) by mouth twice a day for 7 days, then 1 tablet (5 mg) twice a day for 23 days.  Then continue with the next 5 mg prescription. 74 tablet 0    apixaban (ELIQUIS) 5 MG TABS Take 1 tablet (5 mg) by mouth twice a day. 60 tablet 0    atorvastatin (LIPITOR) 20 MG tablet Take 1 tablet (20 mg) by mouth daily. 90 tablet 1    biotin 1000 MCG tablet Take 1 tablet (1,000 mcg) by mouth daily.      blood glucose meter PHARMACY: Please dispense insurance approved bluetooth enable meter. 1 each 0    Cyanocobalamin (VITAMIN B 12 PO)       ferrous sulfate 325 (65 Fe) MG tablet Take 1 tablet (325 mg) by mouth daily.      fluoxetine (PROZAC) 40 MG capsule Take 1 capsule (40 mg) by mouth daily. 90 capsule 3    gabapentin (NEURONTIN) 250 MG/5ML solution Take 6 mL (300 mg) by mouth every 8 hours.  300 mL 0    glucose blood test strip 1 strip by Other route every morning (before breakfast). 100 strip 2    glucose blood test strip 1 strip by Other route 2 times daily (before meals). 100 strip 11    lancets 1 each by Other route every morning (before breakfast). 100 each 2    lancets 1 each by Other route 2 times daily (before meals). 100 Lancet. 5    metFORMIN (GLUCOPHAGE) 500 mg tablet Take 1 tablet (500 mg) by mouth every morning AND 2 tablets (1,000 mg) every evening. 270 tablet 1    metFORMIN (RIOMET) 500 MG/5ML oral solution Take 5 mL in the morning and 10 mL in the evening. 473 mL 0    Multiple Vitamin (MULTIVITAMIN) TABS tablet Take 1 tablet by mouth daily.      omeprazole (PRILOSEC) 20 MG capsule Take 1 capsule (20 mg) by mouth every morning (before breakfast). 90 capsule 3    ramipril (ALTACE) 2.5 MG capsule Take 1 capsule (2.5 mg) by mouth daily. 90 capsule 3    sodium chloride 7 % NEBU USE 1 VIAL VIA NEBULIZER  EVERY 12 HOURS 720 mL 3    tamsulosin (  FLOMAX) 0.4 MG capsule Take 1 capsule (0.4 mg) by mouth daily (with food) for 30 days. When pills run out, ok to stop 30 capsule 0    umeclidinium-vilanterol (ANORO ELLIPTA) 62.5-25 MCG/ACT inhaler Inhale 1 puff by mouth daily. 60 each 11    VENTOLIN HFA 108 (90 Base) MCG/ACT inhaler USE 2 INHALATIONS BY MOUTH  EVERY 6 HOURS AS NEEDED FOR WHEEZING 72 g 3    vitamin D3 125 MCG (5000 UT) capsule Take 1 capsule (5,000 Units) by mouth every 24 hours.      vitamin E 400 UNIT capsule Take 1 capsule (400 Units) by mouth daily.       No current facility-administered medications on file prior to visit.         Objective:  BP (!) 135/51 (BP Location: Left arm, BP Patient Position: Sitting, BP cuff size: Regular)   Pulse 77   Temp 97.6 F (36.4 C) (Temporal)   Resp 20   Ht 5\' 3"  (1.6 m)   Wt 72 kg (158 lb 12.8 oz)   SpO2 96%   BMI 28.13 kg/m   Body mass index is 28.13 kg/m.  ABD: Soft, nontender, nondistended, bowel sounds positive  INT: Incisions  clean/dry/intact and healing well  PULM: CTA bilaterally  CARD: S1, S2, RRR        Plan: We  discussed post-operative instructions. Patient was instructed to call if she   notices drainage becomes green/brown, has a foul odor or becomes red/irritated, or if develops reflux, heartburn or dysphagia.  She was instructed to stay on the post-op esophageal diet and to follow up in{NUMBERS; 1-12 [12068]} weeks/months.  All of the patient's questions were answered and she verbalizes understanding of plan.

## 2023-03-03 ENCOUNTER — Ambulatory Visit
Admission: RE | Admit: 2023-03-03 | Discharge: 2023-03-03 | Disposition: A | Discharge status: Home / Self Care | Payer: Medicare Other | Attending: Allergy | Admitting: Allergy

## 2023-03-03 ENCOUNTER — Telehealth (INDEPENDENT_AMBULATORY_CARE_PROVIDER_SITE_OTHER): Payer: Self-pay

## 2023-03-03 VITALS — BP 136/73 | HR 72 | Temp 98.7°F | Resp 16 | Ht 63.0 in | Wt 154.5 lb

## 2023-03-03 DIAGNOSIS — D809 Immunodeficiency with predominantly antibody defects, unspecified: Secondary | ICD-10-CM | POA: Insufficient documentation

## 2023-03-03 LAB — IGG, BLOOD: IGG: 860 mg/dL (ref 700–1600)

## 2023-03-03 MED ORDER — CETIRIZINE HCL 10 MG OR TABS
10.0000 mg | ORAL_TABLET | Freq: Once | ORAL | Status: AC
Start: 2023-03-03 — End: 2023-03-03
  Administered 2023-03-03: 10 mg via ORAL
  Filled 2023-03-03: qty 1

## 2023-03-03 MED ORDER — IMMUNE GLOBULIN (HUMAN) 20 GM/200ML IJ SOLN
0.5500 g/kg | Freq: Once | INTRAMUSCULAR | Status: AC
Start: 2023-03-03 — End: 2023-03-03
  Administered 2023-03-03: 30 g via INTRAVENOUS
  Filled 2023-03-03: qty 200

## 2023-03-03 MED ORDER — DIPHENHYDRAMINE HCL 25 MG OR TABS OR CAPS CUSTOM
25.0000 mg | ORAL_CAPSULE | Freq: Once | ORAL | Status: DC | PRN
Start: 2023-03-03 — End: 2023-03-03

## 2023-03-03 MED ORDER — ACETAMINOPHEN 325 MG PO TABS
650.0000 mg | ORAL_TABLET | Freq: Once | ORAL | Status: AC
Start: 2023-03-03 — End: 2023-03-03
  Administered 2023-03-03: 650 mg via ORAL
  Filled 2023-03-03: qty 2

## 2023-03-03 MED ORDER — DEXTROSE 5 % IV SOLN
INTRAVENOUS | Status: DC
Start: 2023-03-03 — End: 2023-03-03

## 2023-03-03 NOTE — Telephone Encounter (Signed)
 1st attempt to contact patient Via Phone number. LVM with [facility] details. Waiting on a call back from patient to scheduled appointment.

## 2023-03-03 NOTE — Interdisciplinary (Signed)
 Non-Chemotherapy Nursing Note - [facility]    Tiffany Chen 78 year old female  who presents for infusion IVIG    Pre-treatment nursing assessment:  Patient arrived via ambulatory, alone. Patient alert, oriented and cooperative. VS stable.    Denies fever, chills, or night sweats  Denies chest pain or palpitations  Denies respiratory symptoms including cough, shortness of breath, dyspnea on exertion  Denies nausea, vomiting, diarrhea, constipation   Denies hematuria, dysuria, frequency, urgency  Denies numbness/tingling in hands or feet  No new issues reported at this time      Access  IV access: Right Peripheral vein   Gauge: 24  IV site assessment: Within Normal Limits  Blood return: Brisk  Flush: NS  Declotting agent given: None   Post blood return: Brisk      Labs:    Patient does not have a lab parameter order today.    Treatment Administration  Medications   dextrose 5% infusion (has no administration in time range)   diphenhydrAMINE (BENADRYL) tablet or capsule 25 mg (has no administration in time range)   acetaminophen (TYLENOL) tablet 650 mg (650 mg Oral Given 03/03/23 0913)   cetirizine (ZYRTEC) tablet 10 mg (10 mg Oral Given 03/03/23 0913)   immune globulin (human) (IgG) 30 g in 300 mL (GAMUNEX-C) infusion (0 g IntraVENOUS Completed 03/03/23 1143)     Vitals:    03/03/23 0900 03/03/23 1147   BP: 128/62 136/73   BP Location: Left arm Right arm   BP Patient Position: Sitting Sitting   Pulse: 78 72   Resp: 16 16   Temp: 98 F (36.7 C) 98.7 F (37.1 C)   TempSrc: Temporal    SpO2: 99% 99%   Weight: 70.1 kg (154 lb 8.7 oz)    Height: 5\' 3"  (1.6 m)      Pain Score: NA (pre med, non-pain or scheduled)  Body surface area is 1.77 meters squared.  Body mass index is 27.38 kg/m.      Infusion  Tiffany Chen tolerated treatment well.    Post blood return: Brisk  IV access post infusion: NS and IV d/c      Patient Education  Learner: Patient  Barriers to learning: No Barriers  Readiness to learn:  Acceptance  Method: Explanation        Treatment Education: Information/teaching given to patient including: signs and symptoms of infection, bleeding, adverse reaction(s), symptom control, and when to notify MD.    Tiffany Chen Prevention Education: Instructed patient to call for assistance.    Pain Education: Patient instructed to contact nurse if pain should develop or if their current pain therapy becomes ineffective.    Response: Verbalizes understanding    Discharge Plan  Discharge instructions given to patient.  Future appointments:date given and reviewed with treatment plan.  Discharge Mode: Ambulatory  Discharge Time: 1148  Accompanied by: Self  Discharged To: Home     Return in about 1 month (around 03/31/2023).

## 2023-03-03 NOTE — Telephone Encounter (Signed)
 Duplicate encounter

## 2023-03-03 NOTE — Telephone Encounter (Signed)
 Incoming Referral    Referring Provider: Seltzer,justin    Referral Status: STAT    DX; Acute deep vein thrombosis    Insurance Provider: Medicare A & B    Note: Dr. Cain Sieve please review

## 2023-03-04 NOTE — Addendum Note (Signed)
 Encounter addended by: Ladoris Gene on: 03/04/2023 5:20 PM   Actions taken: Flowsheet accepted, Charge Capture section accepted

## 2023-03-05 NOTE — Progress Notes (Signed)
 [facility]    Demographics:  Date: Feb 28,2025   Patient Name: Tiffany Chen   Medical Record #: [id number]   DOB: 10-14-1945  Age: 78 year old  Sex: female    Requested By:  Ebbie Latus    Chief complaint: Recent acute provoked (surgery) DVT    Reason for consultation: As above    HPI: Tiffany Chen is a 78 year old female recently admitted 02/10/2023 for a surgical takedown of a colostomy followed by Nissen fundoplication for GERD.. The patient has GERD and had a Nissen fundoplication. On 2/17 the patient suffered a nonocclusive DVT of a proximal calf vein (distal) without extension.She was placed on Eliquis. No other inciting factors. No prior history of DVT.      She reports that did not really have much pain from the DVT but primarily swelling. The swelling went away within 5 days of starting Eliquis. She is now walking 2-4 miles per day again.     Risk factors for DVT: surgery, no family history, no travel        Past Medical and Surgical History:  Past Medical History:   Diagnosis Date    Acquired tracheobronchomegaly with bronchiectasis 05/22/2020    Age-related nuclear cataract of left eye 10/15/2021    Cavitary lesion of lung 07/24/2020    Chronic obstructive airway disease 03-27-16    MAC    Combined forms of age-related cataract of both eyes 10/15/2021    Depression     DM (diabetes mellitus)     Gastroesophageal reflux disease, unspecified whether esophagitis present 10/12/2020    Added automatically from request for surgery [id number]    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    Mycobacterium avium complex 03/20/2021    Pulmonary Mycobacterium avium complex (MAC) infection     s/p Robotic right upper lobectomy with en bloc lower and middle lobe wedge resection, middle lobe wedge resection 03/20/2021    03/20/21 Robotic right upper lobectomy with en bloc lower and middle lobe  wedge resection, middle lobe wedge resection (Onaitis)     Past Surgical History:   Procedure Laterality Date    BRONCHOSCOPY  12/2020    BRAIN SURGERY  12, 03/28/19    CESAREAN SECTION, LOW TRANSVERSE      COLON SURGERY  2007-03-28    COLOSTOMY      CRANIOTOMY      LUNG LOBECTOMY         Social History:  Socioeconomic History    Marital status: Widowed   Tobacco Use    Smoking status: Never    Smokeless tobacco: Never   Vaping Use    Vaping status: Never Used   Substance and Sexual Activity    Alcohol use: Yes     Comment: glass of wine every other month    Drug use: Not Currently    Sexual activity: Not Currently   Social Activities of Daily Living Present    Military Service No    Blood Transfusions No    Caffeine Concern No    Occupational Exposure No    Hobby Hazards Yes     Comment: Gardening, MAC    Sleep Concern No    Stress Concern Yes     Comment: Husband death Cov50, 03/28/2018, sold home, rehomed pets. [place]    Weight Concern No    Special Diet Yes    Back Care No  Exercises Regularly Yes    Bike Helmet Use No    Seat Belt Use No    Performs Self-Exams No   Social History Narrative    Lives with brother Fayrene Fearing at home. Sister also lives in [place]       Family History:  family history includes Cancer in her brother, father, and mother; Diabetes in her brother and father; Heart Disease in her mother; Hypertension in her mother; Psychiatry in her mother; Stroke in her father and m grandmother.    Current Medications:   acetaminophen TAKE 20.3ML BY MOUTH EVERY 4 HOURS 120 mL 0    apixaban Take 2 tablets (10 mg) by mouth 2 times daily for 7 days, THEN 1 tablet (5 mg) 2 times daily for 23 days. Then continue with the next 5 mg prescription. 74 tablet 0    apixaban Take 2 tablets (10 mg) by mouth twice a day for 7 days, then 1 tablet (5 mg) twice a day for 23 days.  Then continue with the next 5 mg prescription. 74 tablet 0    apixaban Take 1 tablet (5 mg) by mouth twice a day. 60 tablet 0    atorvastatin Take 1 tablet  (20 mg) by mouth daily. 90 tablet 1    biotin Take 1 tablet (1,000 mcg) by mouth daily.      blood glucose PHARMACY: Please dispense insurance approved bluetooth enable meter. 1 each 0    Cyanocobalamin (VITAMIN B 12 PO)       ferrous sulfate Take 1 tablet (325 mg) by mouth daily.      fluoxetine Take 1 capsule (40 mg) by mouth daily. 90 capsule 3    gabapentin Take 6 mL (300 mg) by mouth every 8 hours. 300 mL 0    glucose blood 1 strip by Other route every morning (before breakfast). 100 strip 2    glucose blood 1 strip by Other route 2 times daily (before meals). 100 strip 11    lancets 1 each by Other route every morning (before breakfast). 100 each 2    lancets 1 each by Other route 2 times daily (before meals). 100 Lancet. 5    metFORMIN Take 1 tablet (500 mg) by mouth every morning AND 2 tablets (1,000 mg) every evening. 270 tablet 1    metFORMIN Take 5 mL in the morning and 10 mL in the evening. 473 mL 0    multivitamin Take 1 tablet by mouth daily.      omeprazole Take 1 capsule (20 mg) by mouth every morning (before breakfast). 90 capsule 3    ramipril Take 1 capsule (2.5 mg) by mouth daily. 90 capsule 3    sodium chloride USE 1 VIAL VIA NEBULIZER  EVERY 12 HOURS 720 mL 3    tamsulosin Take 1 capsule (0.4 mg) by mouth daily (with food) for 30 days. When pills run out, ok to stop 30 capsule 0    umeclidinium-vilanterol Inhale 1 puff by mouth daily. 60 each 11    Ventolin HFA USE 2 INHALATIONS BY MOUTH  EVERY 6 HOURS AS NEEDED FOR WHEEZING 72 g 3    vitamin D3 Take 1 capsule (5,000 Units) by mouth every 24 hours.      vitamin E Take 1 capsule (400 Units) by mouth daily.         Allergies:  Walnuts    Physical Examination:  There were no vitals taken for this visit.  General: Awake and alert, well  nourished, well developed, appears stated age, no acute distress  Extremities: No edema on lower extremities bilaterally, no clubbing/cyanosis  Neurologic: Awake, alert, and oriented X3, CN II-XII intact, no focal  deficits, normal speech  Psychiatric: Calm and cooperative, appropriate mood and affect, rational thinking      Today's and Recent Laboratory Reviewed:  Hospital Outpatient Visit on 03/03/2023   Component Date Value    IGG 03/03/2023 860    Admission on 02/24/2023, Discharged on 02/24/2023   Component Date Value    Glucose 02/24/2023 127 (H)     BUN 02/24/2023 10     Creatinine 02/24/2023 0.79     eGFR Based on CKD-EPI 20* 02/24/2023 >60     Sodium 02/24/2023 139     Potassium 02/24/2023 4.6     Chloride 02/24/2023 104     Bicarbonate 02/24/2023 21 (L)     Anion Gap 02/24/2023 14     Calcium 02/24/2023 8.4 (L)     WBC 02/24/2023 7.4     RBC 02/24/2023 3.36 (L)     Hgb 02/24/2023 10.2 (L)     Hct 02/24/2023 31.3 (L)     MCV 02/24/2023 93.2     MCH 02/24/2023 30.4     MCHC 02/24/2023 32.6     RDW 02/24/2023 14.0     MPV 02/24/2023 9.1 (L)     Plt Count 02/24/2023 369     Segs 02/24/2023 77.6     Imm Gran % 02/24/2023 0.3     Lymphocytes 02/24/2023 12.4     Monocytes 02/24/2023 6.7     Eosinophils 02/24/2023 2.3     Basophils 02/24/2023 0.7     ANC-Automated 02/24/2023 5.8     Imm Gran Abs 02/24/2023 0.0     Abs Lymphs 02/24/2023 0.9     Abs Monos 02/24/2023 0.5     Abs Eosinophils 02/24/2023 0.2     Abs Basophils 02/24/2023 0.1     Diff Type 02/24/2023 Automated     PTT 02/24/2023 32     PT,Patient 02/24/2023 11.3     INR 02/24/2023 1.0    Admission on 02/10/2023, Discharged on 02/14/2023   Component Date Value    Glucose (POCT) 02/10/2023 161 (H)     Glucose Source (POCT Onl* 02/10/2023 Capillary     Glucose (POCT) 02/10/2023 151 (H)     Glucose Source (POCT Onl* 02/10/2023 Capillary     Glucose (POCT) 02/10/2023 219 (H)     Glucose Source (POCT Onl* 02/10/2023 Capillary     Glucose (POCT) 02/10/2023 257 (H)     Glucose Source (POCT Onl* 02/10/2023 Capillary     Glucose (POCT) 02/10/2023 239 (H)     Glucose Source (POCT Onl* 02/10/2023 Capillary     Glucose (POCT) 02/10/2023 207 (H)     Glucose Source (POCT Onl*  02/10/2023 Capillary     Glucose (POCT) 02/10/2023 187 (H)     Glucose Source (POCT Onl* 02/10/2023 Capillary     Glucose (POCT) 02/10/2023 170 (H)     Glucose Source (POCT Onl* 02/10/2023 Capillary     Glucose 02/11/2023 167 (H)     BUN 02/11/2023 18     Creatinine 02/11/2023 0.80     eGFR Based on CKD-EPI 20* 02/11/2023 >60     Sodium 02/11/2023 136     Potassium 02/11/2023 4.0     Chloride 02/11/2023 103     Bicarbonate 02/11/2023 21 (L)     Anion Gap 02/11/2023 12  Calcium 02/11/2023 8.8     Total Protein 02/11/2023 6.3     Albumin 02/11/2023 3.4 (L)     Bilirubin, Tot 02/11/2023 0.46     AST (SGOT) 02/11/2023 64 (H)     ALT (SGPT) 02/11/2023 57 (H)     Alkaline Phos 02/11/2023 61     WBC 02/11/2023 9.2     RBC 02/11/2023 3.74 (L)     Hgb 02/11/2023 11.4     Hct 02/11/2023 34.0     MCV 02/11/2023 90.9     MCH 02/11/2023 30.5     MCHC 02/11/2023 33.5     RDW 02/11/2023 13.3     MPV 02/11/2023 10.5     Plt Count 02/11/2023 200     Segs 02/11/2023 80.2     Imm Gran % 02/11/2023 0.2     Lymphocytes 02/11/2023 9.9     Monocytes 02/11/2023 9.4     Eosinophils 02/11/2023 0.0     Basophils 02/11/2023 0.3     ANC-Automated 02/11/2023 7.4 (H)     Imm Gran Abs 02/11/2023 0.0     Abs Lymphs 02/11/2023 0.9     Abs Monos 02/11/2023 0.9 (H)     Abs Eosinophils 02/11/2023 0.0     Abs Basophils 02/11/2023 0.0     Diff Type 02/11/2023 Automated     Phosphorous 02/11/2023 3.6     Magnesium 02/11/2023 1.8     WBC 02/12/2023 8.6     RBC 02/12/2023 3.83 (L)     Hgb 02/12/2023 11.6     Hct 02/12/2023 35.4     MCV 02/12/2023 92.4     MCH 02/12/2023 30.3     MCHC 02/12/2023 32.8     RDW 02/12/2023 13.4     MPV 02/12/2023 10.2     Plt Count 02/12/2023 193     Segs 02/12/2023 74.1     Imm Gran % 02/12/2023 0.2     Lymphocytes 02/12/2023 14.4     Monocytes 02/12/2023 10.0     Eosinophils 02/12/2023 0.7     Basophils 02/12/2023 0.6     ANC-Automated 02/12/2023 6.4     Imm Gran Abs 02/12/2023 0.0     Abs Lymphs 02/12/2023 1.2     Abs  Monos 02/12/2023 0.9 (H)     Abs Eosinophils 02/12/2023 0.1     Abs Basophils 02/12/2023 0.1     Diff Type 02/12/2023 Automated     Phosphorous 02/12/2023 2.1 (L)     Magnesium 02/12/2023 1.7     Glucose 02/12/2023 170 (H)     BUN 02/12/2023 11     Creatinine 02/12/2023 0.75     eGFR Based on CKD-EPI 20* 02/12/2023 >60     Sodium 02/12/2023 133 (L)     Potassium 02/12/2023 3.9     Chloride 02/12/2023 102     Bicarbonate 02/12/2023 21 (L)     Anion Gap 02/12/2023 10     Calcium 02/12/2023 8.7     Type 02/12/2023 Catheter     Color 02/12/2023 Light-Yellow     Appearance 02/12/2023 Clear     Specific Gravity 02/12/2023 1.022     pH 02/12/2023 6.0     Protein 02/12/2023 Trace (A)     Glucose 02/12/2023 Negative     Ketones 02/12/2023 Negative     Bilirubin 02/12/2023 Negative     Blood 02/12/2023 1+ (A)     Urobilinogen 02/12/2023 Negative     Nitrite 02/12/2023 Negative     Leuk  Esterase 02/12/2023 Negative     WBC 02/12/2023 0-2     RBC 02/12/2023 11-20 (A)     Bacteria 02/12/2023 None     Mucus 02/12/2023 Rare     WBC 02/13/2023 7.3     RBC 02/13/2023 3.66 (L)     Hgb 02/13/2023 11.2     Hct 02/13/2023 33.3 (L)     MCV 02/13/2023 91.0     MCH 02/13/2023 30.6     MCHC 02/13/2023 33.6     RDW 02/13/2023 13.2     MPV 02/13/2023 10.2     Plt Count 02/13/2023 194     Segs 02/13/2023 74.5     Imm Gran % 02/13/2023 0.4     Lymphocytes 02/13/2023 12.6     Monocytes 02/13/2023 10.6     Eosinophils 02/13/2023 1.5     Basophils 02/13/2023 0.4     ANC-Automated 02/13/2023 5.4     Imm Gran Abs 02/13/2023 0.0     Abs Lymphs 02/13/2023 0.9     Abs Monos 02/13/2023 0.8     Abs Eosinophils 02/13/2023 0.1     Abs Basophils 02/13/2023 0.0     Diff Type 02/13/2023 Automated     Phosphorous 02/13/2023 2.9     Magnesium 02/13/2023 1.8     Glucose 02/13/2023 176 (H)     BUN 02/13/2023 10     Creatinine 02/13/2023 0.71     eGFR Based on CKD-EPI 20* 02/13/2023 >60     Sodium 02/13/2023 134 (L)     Potassium 02/13/2023 3.6     Chloride  02/13/2023 101     Bicarbonate 02/13/2023 21 (L)     Anion Gap 02/13/2023 12     Calcium 02/13/2023 8.7     Glucose (POCT) 02/13/2023 199 (H)     Glucose Source (POCT Onl* 02/13/2023 Capillary     Glucose (POCT) 02/13/2023 163 (H)     Glucose Source (POCT Onl* 02/13/2023 Capillary     Glucose (POCT) 02/13/2023 264 (H)     Glucose Source (POCT Onl* 02/13/2023 Capillary     WBC 02/14/2023 7.1     RBC 02/14/2023 3.60 (L)     Hgb 02/14/2023 11.2     Hct 02/14/2023 33.1 (L)     MCV 02/14/2023 91.9     MCH 02/14/2023 31.1     MCHC 02/14/2023 33.8     RDW 02/14/2023 13.3     MPV 02/14/2023 10.4     Plt Count 02/14/2023 229     Segs 02/14/2023 71.8     Imm Gran % 02/14/2023 0.3     Lymphocytes 02/14/2023 13.9     Monocytes 02/14/2023 10.2     Eosinophils 02/14/2023 3.4     Basophils 02/14/2023 0.4     ANC-Automated 02/14/2023 5.1     Imm Gran Abs 02/14/2023 0.0     Abs Lymphs 02/14/2023 1.0     Abs Monos 02/14/2023 0.7     Abs Eosinophils 02/14/2023 0.2     Abs Basophils 02/14/2023 0.0     Diff Type 02/14/2023 Automated     Phosphorous 02/14/2023 2.7     Magnesium 02/14/2023 1.8     Glucose 02/14/2023 200 (H)     BUN 02/14/2023 11     Creatinine 02/14/2023 0.78     eGFR Based on CKD-EPI 20* 02/14/2023 >60     Sodium 02/14/2023 133 (L)     Potassium 02/14/2023 3.5     Chloride 02/14/2023 98  Bicarbonate 02/14/2023 22     Anion Gap 02/14/2023 13     Calcium 02/14/2023 8.7     Glucose (POCT) 02/14/2023 206 (H)     Glucose Source (POCT Onl* 02/14/2023 Capillary     Glucose (POCT) 02/14/2023 177 (H)     Glucose Source (POCT Onl* 02/14/2023 Capillary     Glucose (POCT) 02/14/2023 213 (H)     Glucose Source (POCT Onl* 02/14/2023 Capillary            Imaging:    02/24/2023   EXAM DESCRIPTION:  [place] LOWER EXTREMITY VENOUS DUPLEX LTD - RIGHT     CLINICAL HISTORY:  Rule out deep venous thrombosis; swelling to the right lower extremity for 1 day, she recently had surgery.     COMPARISON:  None     FINDINGS:  RIGHT:  Common femoral  vein: Normal flow and compressibility.  Deep femoral vein: Normal flow.  Greater saphenous vein: Normal flow and compressibility.  Femoral vein: Normal flow and compressibility.  Popliteal vein: Normal flow and compressibility.  Proximal calf vein: Nonocclusive thrombus     LEFT:  Common femoral vein: Normal flow and compressibility.     Color flow was otherwise normal. Spectral waveforms are within normal limits.     IMPRESSION:  Nonocclusive thrombus in the right calf vein. No extension into the popliteal vein.       Assessment/Plan:  Recent history of acute provoked DVT post surgery. My recommendation is 3 months of anticoagulation as this was provoked and is a distal DVT. Thrombophilia testing is not indicated in an 78 year old.   Normocytic Anemia - recommend this be evaluated by primary care    RECOMMENDATIONS:  3 months of anticoagulation. Then DC Anticoagulation.  Anemia - stable for 1 year. I recommend that her primary care MD initiate a w/u with Vit B12, ferritin levels. If patient is not uptodate on colonoscopy would obtain. Other considerations for workup are liver disease, infection, drugs, anemia of chronic disease. Alcoholism.  Hypothyroidism,and hemolysis.   Will discharge from this clinic and return to care of her primary care MD. F/U prn.       The plan of care was reviewed with patient who agrees with the plan of care and verbalized understanding.       Ardelle Lesches M.D.  Attending physician  [facility]     Time spent in clinic visit: 60 minutes

## 2023-03-07 ENCOUNTER — Encounter (INDEPENDENT_AMBULATORY_CARE_PROVIDER_SITE_OTHER): Payer: Self-pay

## 2023-03-07 ENCOUNTER — Ambulatory Visit (INDEPENDENT_AMBULATORY_CARE_PROVIDER_SITE_OTHER): Payer: Medicare Other

## 2023-03-07 VITALS — BP 122/59 | HR 78 | Resp 16 | Wt 151.4 lb

## 2023-03-07 DIAGNOSIS — I82461 Acute embolism and thrombosis of right calf muscular vein: Secondary | ICD-10-CM

## 2023-03-07 DIAGNOSIS — I824Z9 Acute embolism and thrombosis of unspecified deep veins of unspecified distal lower extremity: Secondary | ICD-10-CM

## 2023-03-07 NOTE — Interdisciplinary (Signed)
 Reason for Visit: Tiffany Chen is a 78 year old bilateral LE swelling      There were no vitals filed for this visit.     Any major symptoms you'd like to address? No    Have you missed any doses of your anticoagulation? No    Do you have any upcoming procedures? No    Patient Education:  Identified learning needs: Diagnosis, Care Plan, Treatment  Learner: Patient  Barriers to learning: No Barriers  Readiness to learn: Acceptance  Method: Explanation  Treatment education given: Yes  Fall prevention education given: Yes  Pain education given: Yes  Response: Verbalizes understanding     D/C

## 2023-03-07 NOTE — Patient Instructions (Signed)
 For after hours, holiday, weekend concerns please contact [phone] and ask to have the Hematology/Oncology Fellow paged    For questions or concerns, the [facility] Team can be reached at: [phone]      Please visit our website: [email or web address]    If you are having any of the following COVID-19 Symptoms such as:  Fever or Chills ( greater than 100.3 F)  Cough  Shortness of breath or difficulty breathing  Fatigue Muscle or body aches  Headache  Loss of taste or smell  Sore throat  Congestion or runny nose  Nausea or vomiting  Diarrhea     Please Call [phone] and a triage nurse will direct you to a testing site location near you.          DISCHARGE INSTRUCTIONS:    Follow up with your healthcare provider as directed: Write down your questions so you remember to ask them during your visits.      Fall Prevention    AMBULATORY CARE:    Fall prevention includes ways to make your home and other areas safer. It also includes ways you can move more carefully to prevent a fall. Health conditions that cause changes in your blood pressure, vision, or muscle strength and coordination may increase your risk for falls. Medicines may also increase your risk for falls if they make you dizzy, weak, or sleepy.    Call 911 or have someone else call if:    You have fallen and are unconscious.    You have fallen and cannot move part of your body.    Contact your healthcare provider if:    You have fallen and have pain or a headache.    You have questions or concerns about your condition or care.    Fall prevention tips:    Stand or sit up slowly. This may help you keep your balance and prevent falls.    Use assistive devices as directed. Your healthcare provider may suggest that you use a cane or walker to help you keep your balance. You may need to have grab bars put in your bathroom near the toilet or in the  shower.    Wear shoes that fit well and have soles that grip. Wear shoes both inside and outside. Use slippers with good grip. Do not wear shoes with high heels.    Wear a personal alarm. This is a device that allows you to call 911 if you fall and need help. Ask your healthcare provider for more information.    Stay active. Exercise can help strengthen your muscles and improve your balance. Your healthcare provider may recommend water aerobics or walking. He or she may also recommend physical therapy to improve your coordination. Never start an exercise program without talking to your healthcare provider first.     Manage your medical conditions. Keep all appointments with your healthcare providers. Visit your eye doctor as directed.    Home safety tips:    Add items to prevent falls in the bathroom. Put nonslip strips on your bath or shower floor to prevent you from slipping. Use a bath mat if you do not have carpet in the bathroom. This will prevent you from falling when you step out of the bath or shower. Use a shower seat so you do not need to stand while you shower. Sit on the toilet or a chair in your bathroom to dry yourself and put on clothing.  This will prevent you from losing your balance from drying or dressing yourself while you are standing.     Keep paths clear. Remove books, shoes, and other objects from walkways and stairs. Place cords for telephones and lamps out of the way so that you do not need to walk over them. Tape them down if you cannot move them. Remove small rugs. If you cannot remove a rug, secure it with double-sided tape. This will prevent you from tripping.     Install bright lights in your home. Use night lights to help light paths to the bathroom or kitchen. Always turn on the light before you start walking.    Keep items you use often on shelves within reach. Do not use a step stool to help you reach an item.    Paint or place reflective tape on the edges of your stairs. This will  help you see the stairs better.    Follow up with your healthcare provider as directed: Write down your questions so you remember to ask them during your visits.

## 2023-03-18 NOTE — Telephone Encounter (Signed)
 2nd attempt contacting patient no answer left vm to cb.

## 2023-03-24 ENCOUNTER — Other Ambulatory Visit: Payer: Self-pay

## 2023-03-24 NOTE — Telephone Encounter (Signed)
 Appointment scheduled.

## 2023-03-26 ENCOUNTER — Telehealth (HOSPITAL_BASED_OUTPATIENT_CLINIC_OR_DEPARTMENT_OTHER): Payer: Self-pay

## 2023-03-26 NOTE — Telephone Encounter (Signed)
 Dr. Luke,     Ivone Licht is scheduled for infusion of IVIG on 03/31/2023.     The patient has a scheduled clinic visit on 07/30/2023.    The following item are currently outstanding:    Treatment plan date is outside of the plan's treatment window  Please adjust the date to reflect the patient's scheduled appointment on 03/31/2023    *As a reminder of our infusion center process, if an unsigned order and/or consent are not addressed 1 business day before the infusion center appointment, the patient appointment will be cancelled.      Thank you,   Rosaline Jasmine, RN

## 2023-03-27 NOTE — Telephone Encounter (Signed)
 Orders changed to 03/31/23 date - show as signed prior to routing

## 2023-03-28 ENCOUNTER — Ambulatory Visit (INDEPENDENT_AMBULATORY_CARE_PROVIDER_SITE_OTHER): Payer: Medicare Other | Admitting: General Surgery

## 2023-03-28 VITALS — BP 150/71 | HR 67 | Temp 98.1°F | Resp 20 | Ht 63.0 in | Wt 155.0 lb

## 2023-03-28 DIAGNOSIS — Z09 Encounter for follow-up examination after completed treatment for conditions other than malignant neoplasm: Secondary | ICD-10-CM

## 2023-03-28 DIAGNOSIS — Z9889 Other specified postprocedural states: Secondary | ICD-10-CM

## 2023-03-28 NOTE — Patient Instructions (Signed)
 RTC PRN

## 2023-03-28 NOTE — Interdisciplinary (Signed)
 Per patient no changes in meds some are on hold for now .

## 2023-03-28 NOTE — Progress Notes (Signed)
 Minimally Invasive Surgery Post Operative Visit    Subjective: Tiffany Chen is a 78 year old female who is 6 weeks s/p Laparoscopic repair of hiatal hernia;Laparoscopic Nissen Fundoplication on 02/10/23 with Dr Bernice.      Presents with her sister.  Denies problems with incisions, fever, chills, nausea, vomiting,  constipation. Metamucil wafers effective for managing diarrhea, now resolved~ never followed up with Dr Jannine  Developed Rt DVT 02/24/23 on AC x 3 months.  She stopped almost all her medications post operatively ( because they were pills~ except Eliquis ) unless it was liquid~ did not take omeprazole  or bp meds    Endorses some rare intermittent heartburn ONLY when she gulps or eats quickly; aware she need to slow her eating.   Current activity level is walking.    Medications   acetaminophen  TAKE 20.3ML BY MOUTH EVERY 4 HOURS 120 mL 0    apixaban  Take 2 tablets (10 mg) by mouth twice a day for 7 days, then 1 tablet (5 mg) twice a day for 23 days.  Then continue with the next 5 mg prescription. 74 tablet 0    apixaban  Take 1 tablet (5 mg) by mouth twice a day. 60 tablet 0    atorvastatin  Take 1 tablet (20 mg) by mouth daily. 90 tablet 1    biotin  Take 1 tablet (1,000 mcg) by mouth daily.      blood glucose PHARMACY: Please dispense insurance approved bluetooth enable meter. 1 each 0    Cyanocobalamin (VITAMIN B 12 PO)       ferrous sulfate  Take 1 tablet (325 mg) by mouth daily.      fluoxetine  Take 1 capsule (40 mg) by mouth daily. 90 capsule 3    gabapentin  Take 6 mL (300 mg) by mouth every 8 hours. 300 mL 0    glucose blood 1 strip by Other route every morning (before breakfast). 100 strip 2    glucose blood 1 strip by Other route 2 times daily (before meals). 100 strip 11    lancets 1 each by Other route every morning (before breakfast). 100 each 2    lancets 1 each by Other route 2 times daily (before meals). 100 Lancet. 5    metFORMIN  Take 1 tablet (500 mg) by mouth every morning AND 2  tablets (1,000 mg) every evening. 270 tablet 1    metFORMIN  Take 5 mL in the morning and 10 mL in the evening. 473 mL 0    multivitamin Take 1 tablet by mouth daily.      omeprazole  Take 1 capsule (20 mg) by mouth every morning (before breakfast). 90 capsule 3    ramipril  Take 1 capsule (2.5 mg) by mouth daily. 90 capsule 3    sodium chloride  USE 1 VIAL VIA NEBULIZER  EVERY 12 HOURS 720 mL 3    tamsulosin  Take 1 capsule (0.4 mg) by mouth daily (with food) for 30 days. When pills run out, ok to stop 30 capsule 0    umeclidinium-vilanterol Inhale 1 puff by mouth daily. 60 each 11    Ventolin  HFA USE 2 INHALATIONS BY MOUTH  EVERY 6 HOURS AS NEEDED FOR WHEEZING 72 g 3    vitamin D3 Take 1 capsule (5,000 Units) by mouth every 24 hours.      vitamin E  Take 1 capsule (400 Units) by mouth daily.         Objective:   There were no vitals filed for this visit. There is no height  or weight on file to calculate BMI.  ABD: Soft, nontender, nondistended  SKIN/Wound: Incisions healing well without signs of infection.  Well approximated & CDI. No drainage or erythema noted. No current signs of recurrence.        Assessment/Plan:  **Pt was seen/examined in conjunction with Dr. Bernice Peters Tiffany Chen is a 78 year old female who is 6 weeks s/p Laparoscopic repair of hiatal hernia;Laparoscopic Nissen Fundoplication on 02/10/23 with Dr Bernice.    Full activity  Advance to full diet and please take all medications  Continue PPI PRN  RTC PRN    Orders: No orders of the defined types were placed in this encounter.      Note Author: Renna Romilda Mon, Nurse Practitioner

## 2023-03-31 ENCOUNTER — Ambulatory Visit
Admission: RE | Admit: 2023-03-31 | Discharge: 2023-03-31 | Disposition: A | Discharge status: Home / Self Care | Payer: Medicare Other | Attending: Allergy | Admitting: Allergy

## 2023-03-31 VITALS — BP 152/76 | HR 70 | Temp 96.9°F | Resp 16 | Ht 63.0 in | Wt 154.3 lb

## 2023-03-31 DIAGNOSIS — D809 Immunodeficiency with predominantly antibody defects, unspecified: Secondary | ICD-10-CM | POA: Insufficient documentation

## 2023-03-31 MED ORDER — DEXTROSE 5 % IV SOLN
INTRAVENOUS | Status: DC
Start: 2023-03-31 — End: 2023-03-31

## 2023-03-31 MED ORDER — CETIRIZINE HCL 10 MG OR TABS
10.0000 mg | ORAL_TABLET | Freq: Once | ORAL | Status: AC
Start: 2023-03-31 — End: 2023-03-31
  Administered 2023-03-31: 08:00:00 10 mg via ORAL
  Filled 2023-03-31: qty 1

## 2023-03-31 MED ORDER — ACETAMINOPHEN 325 MG PO TABS
650.0000 mg | ORAL_TABLET | Freq: Once | ORAL | Status: AC
Start: 2023-03-31 — End: 2023-03-31
  Administered 2023-03-31: 08:00:00 650 mg via ORAL
  Filled 2023-03-31: qty 2

## 2023-03-31 MED ORDER — DIPHENHYDRAMINE HCL 25 MG OR TABS OR CAPS CUSTOM
25.0000 mg | ORAL_CAPSULE | Freq: Once | ORAL | Status: DC | PRN
Start: 2023-03-31 — End: 2023-03-31

## 2023-03-31 MED ORDER — IMMUNE GLOBULIN (HUMAN) 20 GM/200ML IJ SOLN
0.5500 g/kg | Freq: Once | INTRAMUSCULAR | Status: AC
Start: 2023-03-31 — End: 2023-03-31
  Administered 2023-03-31: 09:00:00 30 g via INTRAVENOUS
  Filled 2023-03-31: qty 200

## 2023-03-31 NOTE — Interdisciplinary (Signed)
 Non-Chemotherapy Infusion Nursing Note - [facility] Tiffany Chen is a pleasant 78 year old female who presents for infusion of IVIG Gamunex 30 mg    Vitals:    03/31/23 0758   BP: 149/68   BP Location: Left arm   BP Patient Position: Sitting   Pulse: 75   Resp: 16   Temp: 97.8 F (36.6 C)   TempSrc: Temporal   SpO2: 100%   Weight: 70 kg (154 lb 5.2 oz)   Height: 5' 3 (1.6 m)     Pain Score: NA (pre med, non-pain or scheduled)  Body surface area is 1.76 meters squared.  Body mass index is 27.34 kg/m.    Pre-treatment nursing assessment:  Patient arrived ambulatory in stable condition. Unaccompanied, AO x 4  Denies pain.   Denies nausea or vomiting.   Denies diarrhea or constipation.   Denies fevers or recent illness.   Pt states she  is hydrating and eating well.     Peripheral IV inserted to L hand with +blood return.   No orders for  Labs today   Tiffany Chen tolerated treatment well. Titrated as per orders  Observation: Not ordered    Post blood return: Brisk  Post-Flush: D5W & Discontinued IV  Medications   dextrose  5% infusion (has no administration in time range)   diphenhydrAMINE  (BENADRYL ) tablet or capsule 25 mg (has no administration in time range)   acetaminophen  (TYLENOL ) tablet 650 mg (650 mg Oral Given 03/31/23 0805)   cetirizine  (ZYRTEC ) tablet 10 mg (10 mg Oral Given 03/31/23 0804)   immune globulin  (human) (IgG) 30 g in 300 mL (GAMUNEX-C ) infusion (30 g IntraVENOUS New Bag 03/31/23 9161)        Patient Education  Learner: Patient  Barriers to learning: No Barriers  Readiness to learn: Acceptance  Method: Explanation    Treatment Education: Information/teaching given to patient including: signs and symptoms of infection, bleeding, adverse reaction(s), symptom control, and when to notify MD.    [place] Prevention Education: Instructed patient to call for assistance.    Pain Education: Patient instructed to contact nurse if pain should develop or if their current pain  therapy becomes ineffective.    Response: Verbalizes understanding    Discharge Plan  Discharge instructions given to patient.  Future appointments given and reviewed with treatment plan.  Discharge Mode: Ambulatory  Discharge Time: 1048  Accompanied by: Self  Discharged To: Home

## 2023-04-01 NOTE — Addendum Note (Signed)
 Encounter addended by: Abe Hodgkins on: 04/01/2023 8:41 AM   Actions taken: Charge Capture section accepted

## 2023-04-14 ENCOUNTER — Encounter (INDEPENDENT_AMBULATORY_CARE_PROVIDER_SITE_OTHER): Payer: Self-pay | Admitting: Audiology

## 2023-04-14 DIAGNOSIS — H903 Sensorineural hearing loss, bilateral: Secondary | ICD-10-CM

## 2023-04-14 NOTE — Patient Instructions (Addendum)
-   restart nebulized saline. I ordered a new nebulizer  - please get an appointment with our respiratory therapist when you receive the nebulizer: [phone]   - restart the blood thinner and continue to mid May. I sent the prescription  - continue and incrementally increase exercise  - Follow up 2 months with NP Idell Majestic 06/12

## 2023-04-16 ENCOUNTER — Ambulatory Visit: Payer: Medicare Other | Attending: Critical Care Medicine | Admitting: Critical Care Medicine

## 2023-04-16 VITALS — BP 136/74 | HR 80 | Temp 97.0°F | Ht 63.0 in | Wt 151.3 lb

## 2023-04-16 DIAGNOSIS — J479 Bronchiectasis, uncomplicated: Secondary | ICD-10-CM | POA: Insufficient documentation

## 2023-04-16 DIAGNOSIS — A310 Pulmonary mycobacterial infection: Secondary | ICD-10-CM | POA: Insufficient documentation

## 2023-04-16 DIAGNOSIS — I82461 Acute embolism and thrombosis of right calf muscular vein: Secondary | ICD-10-CM | POA: Insufficient documentation

## 2023-04-16 MED ORDER — APIXABAN 5 MG PO TABS
5.0000 mg | ORAL_TABLET | Freq: Two times a day (BID) | ORAL | 0 refills | Status: AC
Start: 2023-04-16 — End: 2023-05-31

## 2023-04-16 NOTE — Progress Notes (Signed)
 Pulmonary Clinic Note    This patient was referred by Belinda Bowler, MD  [address]  [facility] [address]  [place],  [place] [address]    Chief Complaint:  Tiffany Chen is a 78 year old female who is in the clinic today with a chief complaint of follow up.    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     PMH of bronchiectasis, cavitary MAC lung infection, ruptured diverticulitis S/P colostomy in 2009, diabetes, ICH S/P embolization.     MAC history- treatment courses were all azithromycin 500mg , Rifampin 600mg  , ethambutol 1600mg  three times weekly:   - 2018- 01/19/2018  - 05/25/2018- 10/22/2019  - 12/22/2019- 02/16/2020.   - 08/14/2020 started azithro/rifampin/ethambutol PLUS Intravenous amikacin. Amikacin IV replaced by inhaled Arikayce early Novermber 2022.   - 02/21/2021: Clofazimine added  - 06/08/2022: COMPLETED and stopped azithromycin 250mg  daily, rifampin 600mg  daily, Ethambutol 800mg  daily. Inhaled Arikayce daily, clofazimine 100mg  daily  - 08/29/2022: underwent bronchoscopy with biopsy of the LNs. Showed necrotizing granulomas. No malignancy.     She was being treated by Dr. Eston Hence for MAC lung infection. She reports that daily dosing of medication and adding a 4th antibiotic were not discussed before 2022, despite her disease being cavitary.     In late 2021, CT scan showed enlarging cavity. December 2021 a bronch was performed but no MAC growth. Her doctor restarted azithro/Rifampin/ethmabutol based on imaging.   Feb 2022 she saw Dr. Maya Sparrow who stopped the antibiotics based on the lack of microbiologic data. Performed repeat bronch 04/28/2020 which has smear AFB 3+ and culture MAC (drug susc pending)     Over the last several months her  symptoms have consisted of significant daily coughing, causing night time awakening most nights, night sweats every night, and weight loss >30lbs. Also significant fatigue. Dr. Yancey Helena prescribed nebulized saline but she did not start it.   She tried to walk several miles each day despite her fatigue.     Significant heartburn if she lays shortly after eating.     Miscellaneous:   Never smoker.   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   Her husband died after contracting COVID in the hospital.   Moved in with her brother in [place] several months ago.   Her brother is Vegan.   No hot tubs, no swimming pools.     Last visit 10/16/2022:     - 02/10/2023: Underwent Ileostomy takedown with small bowel resection and anastomosis. AND Laparoscopic repair of hiatal hernia and laparoscopic Nissen Fundoplication. Discahrged after a 3 day hospitalization with no complication.   - 02/24/2023: presented to ED with RLE swelling. DVT US  revealed calf vein DVT. Started on Eliquis.   - 03/07/2023: seen by [facility]. Recommended 3 months of anticoagulation     Since surgery in February, energy levels very slowly improving. But she has noticed she has list some mental sharpness. .   She hardly coughs Weight is stable.      No cough or phlegm or night sweeats    Airway clearance:   Hypertonic saline- has not been doing this for a few months.     ROS as  above, otherwise negative.     Patient Active Problem List   Diagnosis    Acquired tracheobronchomegaly with bronchiectasis (CMS-HCC)    MAI (mycobacterium avium-intracellulare) (CMS-HCC)    Cavitary lesion of lung    Gastroesophageal reflux disease, unspecified whether esophagitis present    Hoarse voice quality    Immunodeficiency with predominantly antibody defects    Mycobacterium avium complex (CMS-HCC)    s/p Robotic right upper lobectomy with en bloc lower and middle lobe wedge resection, middle lobe wedge resection    Status post Hartmann  procedure (CMS-HCC)    Ventral hernia without obstruction or gangrene    Colostomy status (CMS-HCC)    Status post cataract extraction and insertion of intraocular lens of right eye    Status post cataract extraction and insertion of intraocular lens of left eye    Type 2 diabetes mellitus without complication, without long-term current use of insulin    Pseudophakia of both eyes    Posterior vitreous detachment    S/P repair of ventral hernia    Ileostomy in place (CMS-HCC)    Unspecified mood (affective) disorder    Hyperlipidemia associated with type 2 diabetes mellitus (CMS-HCC)    Nonruptured cerebral aneurysm    Recurrent major depression in remission    Mediastinal lymphadenopathy    Osteopenia of lumbar spine    Recurrent depression    Hypertension associated with type 2 diabetes mellitus (CMS-HCC)    Chronic GERD     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     Past Surgical History:   Procedure Laterality Date    BRONCHOSCOPY   12/2020    BRAIN SURGERY  12, 2021    CESAREAN SECTION, LOW TRANSVERSE      COLON SURGERY  2009    COLOSTOMY      CRANIOTOMY      LUNG LOBECTOMY       Allergies   Allergen Reactions    Walnuts Swelling     Tongue swollen and burn     Current Outpatient Medications on File Prior to Visit   Medication Sig Dispense Refill    [DISCONTINUED] acetaminophen (TYLENOL) 160 MG/5ML solution TAKE 20.3ML BY MOUTH EVERY 4 HOURS 120 mL 0    [DISCONTINUED] apixaban (ELIQUIS) 5 MG TABS Take 2 tablets (10 mg) by mouth twice a day for 7 days, then 1 tablet (5 mg) twice a day for 23 days.  Then continue with the next 5 mg prescription. 74 tablet 0    [DISCONTINUED] apixaban (ELIQUIS) 5  MG TABS Take 1 tablet (5 mg) by mouth twice a day. 60 tablet 0    atorvastatin (LIPITOR) 20 MG tablet Take 1 tablet (20 mg) by mouth daily. 90 tablet 1    biotin 1000 MCG tablet Take 1 tablet (1,000 mcg) by mouth daily.      blood glucose meter PHARMACY: Please dispense insurance approved bluetooth enable meter. 1 each 0    Cyanocobalamin (VITAMIN B 12 PO)       ferrous sulfate 325 (65 Fe) MG tablet Take 1 tablet (325 mg) by mouth daily.      fluoxetine (PROZAC) 40 MG capsule Take 1 capsule (40 mg) by mouth daily. 90 capsule 3    gabapentin (NEURONTIN) 250 MG/5ML solution Take 6 mL (300 mg) by mouth every 8 hours. 300 mL 0    glucose blood test strip 1 strip by Other route every morning (before breakfast). 100 strip 2    glucose blood test strip 1 strip by Other route 2 times daily (before meals). 100 strip 11    lancets 1 each by Other route every morning (before breakfast). 100 each 2    lancets 1 each by Other route 2 times daily (before meals). 100 Lancet. 5    [DISCONTINUED] metFORMIN (GLUCOPHAGE) 500 mg tablet Take 1 tablet (500 mg) by mouth every morning AND 2 tablets (1,000 mg) every evening. 270 tablet 1    metFORMIN (RIOMET) 500 MG/5ML oral solution Take 5 mL in the morning and 10 mL in the evening. 473 mL 0    Multiple Vitamin (MULTIVITAMIN)  TABS tablet Take 1 tablet by mouth daily.      [DISCONTINUED] omeprazole (PRILOSEC) 20 MG capsule Take 1 capsule (20 mg) by mouth every morning (before breakfast). 90 capsule 3    ramipril (ALTACE) 2.5 MG capsule Take 1 capsule (2.5 mg) by mouth daily. 90 capsule 3    sodium chloride 7 % NEBU USE 1 VIAL VIA NEBULIZER  EVERY 12 HOURS 720 mL 3    [DISCONTINUED] tamsulosin (FLOMAX) 0.4 MG capsule Take 1 capsule (0.4 mg) by mouth daily (with food) for 30 days. When pills run out, ok to stop 30 capsule 0    umeclidinium-vilanterol (ANORO ELLIPTA) 62.5-25 MCG/ACT inhaler Inhale 1 puff by mouth daily. 60 each 11    VENTOLIN HFA 108 (90 Base) MCG/ACT inhaler USE 2 INHALATIONS BY MOUTH  EVERY 6 HOURS AS NEEDED FOR WHEEZING 72 g 3    vitamin D3 125 MCG (5000 UT) capsule Take 1 capsule (5,000 Units) by mouth every 24 hours.      vitamin E 400 UNIT capsule Take 1 capsule (400 Units) by mouth daily.       No current facility-administered medications on file prior to visit.     Family History   Problem Relation Name Age of Onset    Cancer Mother Denton Lank ngs and throat    Heart Disease Mother Joya Martyr         Congrestive heart failure due to cancer    Hypertension Mother Joya Martyr     Psychiatry Mother Joya Martyr         Bi polar, manic depressive    Cancer Father Jamal Maes         Prostate    Diabetes Father Jamal Maes     Stroke Father Jamal Maes     Stroke M Grandmother Winifred Larita Fife     Cancer Brother Fayrene Fearing  Larita Fife         Skin (many locations)    Diabetes Brother Vallarie Mare      none     Socioeconomic History    Marital status: Widowed   Tobacco Use    Smoking status: Never    Smokeless tobacco: Never   Vaping Use    Vaping status: Never Used   Substance and Sexual Activity    Alcohol use: Yes     Comment: glass of wine every other month    Drug use: Not Currently    Sexual activity: Not Currently   Other Topics Concern    Military Service No    Blood Transfusions No    Caffeine Concern No    Occupational  Exposure No    Hobby Hazards Yes     Comment: Gardening, MAC    Sleep Concern No    Stress Concern Yes     Comment: Husband death Cov72, 05-03-18, sold home, rehomed pets. [place]    Weight Concern No    Special Diet Yes    Back Care No    Exercises Regularly Yes    Bike Helmet Use No    Seat Belt Use No    Performs Self-Exams No   Social History Narrative    Lives with brother Fayrene Fearing at home. Sister also lives in [facility]     Social Determinants of Health     Food Insecurity: No Food Insecurity (02/12/2023)    Hunger Vital Sign     Worried About Running Out of Food in the Last Year: Never true     Ran Out of Food in the Last Year: Never true   Transportation Needs: No Transportation Needs (02/12/2023)    PRAPARE - Therapist, art (Medical): No     Lack of Transportation (Non-Medical): No   Intimate Partner Violence: Low Risk  (02/10/2023)    UC IPV     Have you ever been emotionally or physically abused by your partner or someone important to you?: No     Within the last year have you been hit, slapped, kicked or otherwise physically hurt by someone?: No     Within the last year has anyone forced you to have sexual activities?: No     Are you afraid of your spouse or partner you listed above?: No   Housing Stability: Low Risk  (02/12/2023)    Housing Stability Vital Sign     Unable to Pay for Housing in the Last Year: No     Number of Times Moved in the Last Year: 0     Homeless in the Last Year: No       Examination:  Vitals:    04/16/23 1132   BP: 136/74   BP Location: Left arm   BP Patient Position: Sitting   BP cuff size: Regular   Pulse: 80   Temp: 97 F (36.1 C)   TempSrc: Temporal   SpO2: 97%   Weight: 68.6 kg (151 lb 4.8 oz)   Height: 5\' 3"  (1.6 m)         General: The patient is well appearing, sitting comfortably on the examination table with no evidence of respiratory distress.  Heart: regular rate and rhythm, no murmurs rubs or gallops, normally split S2, no accentuation of P2  Lungs:  clear, no wheezing  Skin: no rashes  Neuro: Slight tremor in the left fingers at rest    Laboratory  Review:  Lab Results   Component Value Date    WBC 7.4 02/24/2023    HGB 10.2 (L) 02/24/2023    HCT 31.3 (L) 02/24/2023    PLT 369 02/24/2023     Lab Results   Component Value Date    NA 139 02/24/2023    K 4.6 02/24/2023    CL 104 02/24/2023    BICARB 21 (L) 02/24/2023    BUN 10 02/24/2023    CREAT 0.79 02/24/2023    GLU 127 (H) 02/24/2023    [place] 8.4 (L) 02/24/2023        Pulmonary Function Test Review:  04/28/2020 (care everywhere): noraml spito, normal volumes, mildly reduced DLCO    Radiology Review:  CT chest 04/13/2020:   CONCLUSION:   1. 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   2. Cavitary lesion in the periphery of the right lung has increased in size measuring 2.7 cm, although may have a slightly less thickened and nodular wall.   3.  Diffuse bronchiectasis most pronounced in the right upper lobe.     CT chest 06/22/2021:   IMPRESSION:  1. Post interval right upper lobectomy with a small residual right hydropneumothorax.     2. Scattered tree-in-bud airspace opacities throughout the lungs, similar to slightly improved.  Previously seen patchy groundglass opacities within the lungs appear improved.  3. Mediastinal lymphadenopathy.  This appears slightly increased in comparison to the prior study.    CT chest 11/20/2022: IMPRESSION:  Stable chest including mediastinal adenopathy and clustered pulmonary nodules throughout both lungs compatible with history of nontuberculous mycobacterial disease. No new acute pulmonary process. Continued follow-up is advised.    Microbiology  BAL 04/28/2020 (from Dr. Yancey Helena): AFB smear 3+, culture MAC (drug susceptibly testing sent to [facility]    Sputum - AFB  08/01/20: AFB smear negative, Cx MAC  08/03/20: AFB smear 1+, Cx MAC (MIS clarithro 0.5, amikacin 4)  08/07/20: AFB smear negative,  Cx MAC  10/25: AFB smear 1+, Cx MAC  11/1, 11/09/2020: AFB smear negative, Cx MAC  12/25/2020: AFB smear 2+, Culture MAC  01/30/2021: AFB smear and culture negative  02/06/2021: AFB smear negative, culture heavy MAC  02/12/2021: AFB smear and culture negative  02/15/2021: AFB smear negative, culture moderate MAC  8/22, 08/31/2021: AFB smear negative, culture negative  Sputum-induced 1/8, 01/23/2022: AFB smear and culture negative  Bronch washing 08/29/2022: AFB smear and culture negative. Aspergillus galactomannan negative.   BIOPSY mediastinal LNs 08/29/2022: necrotizing and non necrotizing granulomas.     Sputum - Routine   08/01/20: NL Resp Flora   08/03/20: NL Resp Flora  08/07/20: NL Resp Flora    EKG   08/23/2020: sinus rhythm with PAC     Impression/Plan:    Problem List:   - cavitary MAC lung infection. 08/14/2020 started azithro/rifampin/ethambutol PLUS Intravenous amikacin. Amikacin IV replaced by inhaled Arikayce early Novermber 2022. Clofazimine added 02/21/2021  - S/P DA VINCI XI-Right Robotic upper lobectomy with middle and lower lobe wedge resection on 03/20/2021.   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 Amikacin IV replaced by inhaled Arikayce early Novermber 2022. Clofazimine added 02/21/2021. Stopped antibiotics on June 1st, 2024. Will be considered successful treatment.   - referred to  pulmonary rehab to assist with recovery from lobectomy. She attended a few sessions then canceled. Preferred to walk daily outdoors.   - EGD with [facility] 02/01/2021 showed significant reflux. Referred to Gen surgery for consideration of surgical intervention to decreae risk of recurrent lung infections.    > Still recommend hiatal hernia repair and fundoplication at time of next abdominal surgery. Her cavitary MAC lung infection was severe and complicated and anything that would reduce the chance of recurrence would be highly recommended.   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 Encouraged exercise.   - Follow up 2 months with NP Idell Majestic and 4 months with me      Patient Instructions   - restart nebulized saline. I ordered a new nebulizer  - please get an appointment with our respiratory therapist when you receive the nebulizer: [phone]   - restart the blood thinner and continue to mid May. I sent the prescription  - continue and incrementally increase exercise  - Follow up 2 months with NP Idell Majestic 06/12       Total time spent :   - with patient, examining, history taking, planning management, counseling  - reviewing images  - records review  - interpretation of results  - documentation       Orders:  No orders of the defined types were placed in this encounter.    No orders of the defined types were placed in this encounter.    No orders of the defined types were placed in this encounter.

## 2023-04-18 ENCOUNTER — Other Ambulatory Visit (INDEPENDENT_AMBULATORY_CARE_PROVIDER_SITE_OTHER): Payer: Self-pay | Admitting: Internal Medicine

## 2023-04-18 DIAGNOSIS — E1169 Type 2 diabetes mellitus with other specified complication: Secondary | ICD-10-CM

## 2023-04-21 ENCOUNTER — Encounter (HOSPITAL_BASED_OUTPATIENT_CLINIC_OR_DEPARTMENT_OTHER): Payer: Self-pay | Admitting: Hospital

## 2023-04-22 NOTE — Telephone Encounter (Signed)
 Candra Ihms, CPhT  ([facility])      Statin Refill Protocol    Last visit in enc specialty: 01/23/2023     Recent Visits in This Encounter Department       Date Provider Department Visit Type Primary Dx    01/23/2023 Rosalene Hedda Aurora, DO [facility] Internal Medicine Office Visit RUQ abdominal pain    12/26/2022 Rosalene Hedda Aurora, DO [facility] Internal Medicine Office Visit Type 2 diabetes mellitus without complication, without long-term current use of insulin  (CMS-HCC)    07/09/2022 Arnoldo Ileana Bartley Amil, MD [facility] Internal Medicine Office Visit Type 2 diabetes mellitus without complication, without long-term current use of insulin     05/03/2022 Roselee Scrape, NP [facility] Internal Medicine Office Visit Type 2 diabetes mellitus without complication, without long-term current use of insulin     12/18/2021 Arnoldo Ileana Bartley Amil, MD [facility] Internal Medicine Office Visit Encounter for Medicare annual wellness exam           Population Health Visits  Recent [facility] Visits    None       Next f/u appt due:  Will see you back in May   Next appt in enc specialty: 05/27/2023      Future Appointments 04/22/2023 - 04/20/2028        Date Visit Type Department Provider     04/28/2023 11:00 AM LVL 3 - 3 [facility] ONCOLOGY CHEMO [facility] INFUSION, 9TH FLOOR    Appointment Notes:     IVIG 25g             05/27/2023 11:00 AM RETURN PRIMARY CARE PATIENT [facility] Internal Medicine Rosalene Hedda Aurora, DO    Appointment Notes:     follow up             06/09/2023  2:00 PM AUDIOGRAM [facility] AUDIOLOGY Archie Barnie Ruth, AuD    Appointment Notes:     1 year of Ototoxic monitoring audiogram             06/19/2023 12:45 PM RETURN PULM GEN [facility] Purvis Fendt, Richie, NP             07/30/2023  3:00 PM RETURN IMMUNODEFICIENCY [facility] Allergy [facility] Luke Marsa Lie, MD    Appointment Notes:     6 month follow-up 45 min                    Per OV  [id number]  A/P-Type 2 diabetes mellitus without complication, without long-term current use of insulin  (CMS-HCC)  Hypertension associated with type 2 diabetes mellitus (CMS-HCC)  Hyperlipidemia associated with type 2 diabetes mellitus (CMS-HCC)  Elevated BP  A1C repeated 12/26/22 6.6, overall reasonable control. Will plan to repeat in 6 months May-June 2025  Continue metformin  500 mg in AM and 1,000 mg in PM for now.   A. Retinopathy: Last eye exam 01/2022, repeat 01/2023  B. Nephropathy: UMA last 12/26/22 2mg /dl, CMP 88/86/75 WNL. Continue to monitor  C. Neuropathy: Foot exam 04/2021, repeat 04/2022  D. Lipids: Lipids LDL 40 10/14/22. Continue atorvastatin  20mg  daily  E. Macrovascular: No evidence of macrovascular disease   F: Blood pressure: BP elevated today. She related elevated BP to her mood. Prefers  not to check home BP. BP today 117/65 at goal. Continue ramapril as prescribed        LABS required:  (Q year Lipid Panel, ALT (baseline only))    Lab Results   Component Value Date    CHOL 115 10/14/2022    TRIG 114 10/14/2022    HDL 55 10/14/2022    NHDLV 60 10/14/2022    LDLCALC 40 10/14/2022        Lab Results   Component Value Date    ALT 57 (H) 02/11/2023         Monitoring required:  (None)

## 2023-04-28 ENCOUNTER — Ambulatory Visit
Admission: RE | Admit: 2023-04-28 | Discharge: 2023-04-28 | Disposition: A | Discharge status: Home / Self Care | Payer: Medicare Other | Attending: Allergy | Admitting: Allergy

## 2023-04-28 VITALS — BP 140/69 | HR 59 | Temp 97.5°F | Resp 16 | Wt 153.0 lb

## 2023-04-28 DIAGNOSIS — D809 Immunodeficiency with predominantly antibody defects, unspecified: Secondary | ICD-10-CM | POA: Insufficient documentation

## 2023-04-28 MED ORDER — CETIRIZINE HCL 10 MG OR TABS
10.0000 mg | ORAL_TABLET | Freq: Once | ORAL | Status: AC
Start: 2023-04-28 — End: 2023-04-28
  Administered 2023-04-28: 10 mg via ORAL
  Filled 2023-04-28: qty 1

## 2023-04-28 MED ORDER — IMMUNE GLOBULIN (HUMAN) 20 GM/200ML IJ SOLN
0.5500 g/kg | Freq: Once | INTRAMUSCULAR | Status: AC
Start: 2023-04-28 — End: 2023-04-28
  Administered 2023-04-28: 30 g via INTRAVENOUS
  Filled 2023-04-28: qty 200

## 2023-04-28 MED ORDER — DEXTROSE 5 % IV SOLN
INTRAVENOUS | Status: DC
Start: 2023-04-28 — End: 2023-04-28

## 2023-04-28 MED ORDER — ACETAMINOPHEN 325 MG PO TABS
650.0000 mg | ORAL_TABLET | Freq: Once | ORAL | Status: AC
Start: 2023-04-28 — End: 2023-04-28
  Administered 2023-04-28: 650 mg via ORAL
  Filled 2023-04-28: qty 2

## 2023-04-28 NOTE — Interdisciplinary (Signed)
 Non-Chemotherapy Infusion Nursing Note - [facility] Tiffany Chen is a 78 year old female who presents for infusion of  IVIG    Vitals:    04/28/23 1048 04/28/23 1058 04/28/23 1345   BP:  141/61 140/69   Pulse:  69 59   Resp:  16 16   Temp:  96 F (35.6 C) 97.5 F (36.4 C)   SpO2:  96% 96%   Weight: 69.7 kg (153 lb 9.6 oz) 69.4 kg (153 lb)      Pain Score: 0  Body surface area is 1.76 meters squared.  Body mass index is 27.1 kg/m.    Pre-treatment nursing assessment:  No problems identified upon assessment.    Tiffany Chen tolerated treatment well.    Medications   dextrose  5% infusion (0 mL IntraVENOUS Stopped 04/28/23 1350)   acetaminophen  (TYLENOL ) tablet 650 mg (650 mg Oral Given 04/28/23 1057)   cetirizine  (ZYRTEC ) tablet 10 mg (10 mg Oral Given 04/28/23 1057)   immune globulin  (human) (IgG) 30 g in 300 mL (GAMUNEX-C ) infusion (0 g IntraVENOUS Stopped 04/28/23 1345)      Observation: Not ordered    Post blood return: Brisk  Post-Flush: NS    Patient Education  Learner: Patient  Barriers to learning: No Barriers  Readiness to learn: Acceptance  Method: Explanation    Treatment Education: Information/teaching given to patient including: signs and symptoms of infection, bleeding, adverse reaction(s), symptom control, and when to notify MD.    [place] Prevention Education: Instructed patient to call for assistance.    Pain Education: Patient instructed to contact nurse if pain should develop or if their current pain therapy becomes ineffective.    Response: Verbalizes understanding    Discharge Plan  Discharge instructions given to patient.  Future appointments given and reviewed with treatment plan.  Discharge Mode: Ambulatory  Discharge Time: 1350  Accompanied by: Self  Discharged To: Home

## 2023-04-29 NOTE — Addendum Note (Signed)
 Encounter addended by: Ivory Marseille on: 04/29/2023 12:17 PM   Actions taken: Flowsheet accepted, Charge Capture section accepted

## 2023-04-30 ENCOUNTER — Other Ambulatory Visit (INDEPENDENT_AMBULATORY_CARE_PROVIDER_SITE_OTHER): Payer: Self-pay | Admitting: Critical Care Medicine

## 2023-04-30 DIAGNOSIS — A319 Mycobacterial infection, unspecified: Secondary | ICD-10-CM

## 2023-04-30 NOTE — Telephone Encounter (Signed)
 Request to change pharmacies from [facility]     Medication Refill Request (Non-Controlled)  Last visit in this department: 04/16/2023  Future Appointments   Date Time Provider Department Center   05/26/2023  8:00 AM [facility] INFUSION, 9TH FLOOR [facility] Onc Chem [place]   05/27/2023 11:00 AM Rosalene Hedda Aurora, DO [facility] Int Med [facility]   06/09/2023  2:00 PM Archie Barnie Ruth, AuD [facility]   06/13/2023  8:30 AM Donnel Evalene Lunger, MD [facility] Pulm Gen MOS   06/19/2023 12:45 PM Pena Ledon, Gilberto, NP [facility] Pulm Sle Exec. [id number]   06/23/2023  8:00 AM [facility] INFUSION, 9TH FLOOR [facility] Onc Chem [place]   07/21/2023  8:00 AM [facility] INFUSION, 9TH FLOOR [facility] Onc Chem [place]   07/30/2023  3:00 PM Luke Marsa Lie, MD [facility] Allergy [facility]     Medication Requested:   Requested Prescriptions     Pending Prescriptions Disp Refills    umeclidinium-vilanterol (ANORO ELLIPTA ) 62.5-25 MCG/ACT inhaler 60 each 11     Sig: Inhale 1 puff by mouth daily.       Pharmacy:   [facility] [address] [place], [place] - [address]   [address]   [place] [address]  Phone: [phone] Fax: [phone]

## 2023-05-01 ENCOUNTER — Other Ambulatory Visit: Attending: Nurse Practitioner

## 2023-05-01 DIAGNOSIS — Z932 Ileostomy status: Secondary | ICD-10-CM | POA: Insufficient documentation

## 2023-05-01 DIAGNOSIS — R5383 Other fatigue: Secondary | ICD-10-CM | POA: Insufficient documentation

## 2023-05-01 LAB — VITAMIN B12, BLOOD: Vitamin B12: 808 pg/mL (ref 232–1245)

## 2023-05-02 ENCOUNTER — Encounter (INDEPENDENT_AMBULATORY_CARE_PROVIDER_SITE_OTHER): Payer: Self-pay | Admitting: Nurse Practitioner

## 2023-05-02 MED ORDER — UMECLIDINIUM-VILANTEROL 62.5-25 MCG/ACT IN AEPB
1.0000 | INHALATION_SPRAY | Freq: Every day | RESPIRATORY_TRACT | 11 refills | Status: AC
Start: 2023-05-02 — End: ?

## 2023-05-13 ENCOUNTER — Telehealth (HOSPITAL_BASED_OUTPATIENT_CLINIC_OR_DEPARTMENT_OTHER): Payer: Self-pay | Admitting: Critical Care Medicine

## 2023-05-13 DIAGNOSIS — J471 Bronchiectasis with (acute) exacerbation: Secondary | ICD-10-CM

## 2023-05-13 MED ORDER — ALBUTEROL SULFATE (2.5 MG/3ML) 0.083% IN NEBU
2.5000 mg | INHALATION_SOLUTION | RESPIRATORY_TRACT | 11 refills | Status: AC | PRN
Start: 2023-05-13 — End: ?

## 2023-05-13 MED ORDER — SODIUM CHLORIDE (INHALANT) 7 % IN NEBU
4.0000 mL | INHALATION_SOLUTION | Freq: Two times a day (BID) | RESPIRATORY_TRACT | 11 refills | Status: AC
Start: 2023-05-13 — End: ?

## 2023-05-13 NOTE — Telephone Encounter (Signed)
 Orders pended to MD

## 2023-05-13 NOTE — Telephone Encounter (Signed)
 Melissa- [facility]  is calling to inform clinic nebulizer will be delivered tmrw between 10-3pm. Please send rx for medication that need to be nebulized to pharmacy. Caller unsure of which medication.         Preferred Pharmacy  [facility] [address] [place], [place] - [address]    [address]  [place] [address]   Phone: [phone] Fax: [phone]   Hours: Mon-Fri 8am-8pm, Sat-Sun 9am-5pm         Please assist. Thank you.     Best callback Ph.  [phone]    Is it okay to leave a message? Fernande Howells

## 2023-05-22 ENCOUNTER — Telehealth (HOSPITAL_BASED_OUTPATIENT_CLINIC_OR_DEPARTMENT_OTHER): Payer: Self-pay | Admitting: Critical Care Medicine

## 2023-05-22 NOTE — Telephone Encounter (Signed)
 Please contact patient to schedule     Reason for appt request:  RT Teaching     Best Call Number: [phone]

## 2023-05-26 ENCOUNTER — Ambulatory Visit
Admission: RE | Admit: 2023-05-26 | Discharge: 2023-05-26 | Disposition: A | Discharge status: Home / Self Care | Attending: Allergy | Admitting: Allergy

## 2023-05-26 VITALS — BP 139/72 | HR 69 | Temp 98.0°F | Resp 16 | Ht 63.0 in | Wt 150.4 lb

## 2023-05-26 DIAGNOSIS — D809 Immunodeficiency with predominantly antibody defects, unspecified: Secondary | ICD-10-CM | POA: Insufficient documentation

## 2023-05-26 MED ORDER — DEXTROSE 5 % IV SOLN
INTRAVENOUS | Status: DC
Start: 2023-05-26 — End: 2023-05-26

## 2023-05-26 MED ORDER — IMMUNE GLOBULIN (HUMAN) 20 GM/200ML IJ SOLN
0.5500 g/kg | Freq: Once | INTRAMUSCULAR | Status: AC
Start: 2023-05-26 — End: 2023-05-26
  Administered 2023-05-26: 30 g via INTRAVENOUS
  Filled 2023-05-26: qty 200

## 2023-05-26 MED ORDER — CETIRIZINE HCL 10 MG OR TABS
10.0000 mg | ORAL_TABLET | Freq: Once | ORAL | Status: AC
Start: 2023-05-26 — End: 2023-05-26
  Administered 2023-05-26: 10 mg via ORAL
  Filled 2023-05-26: qty 1

## 2023-05-26 MED ORDER — ACETAMINOPHEN 325 MG PO TABS
650.0000 mg | ORAL_TABLET | Freq: Once | ORAL | Status: AC
Start: 2023-05-26 — End: 2023-05-26
  Administered 2023-05-26: 650 mg via ORAL
  Filled 2023-05-26: qty 2

## 2023-05-26 NOTE — Interdisciplinary (Signed)
 Non-Chemotherapy Infusion Nursing Note - [facility] Tiffany Chen is a 78 year old female who presents for infusion of IVIG.    Patient Active Problem List   Diagnosis    Acquired tracheobronchomegaly with bronchiectasis (CMS-HCC)    MAI (mycobacterium avium-intracellulare) (CMS-HCC)    Cavitary lesion of lung    Gastroesophageal reflux disease, unspecified whether esophagitis present    Hoarse voice quality    Immunodeficiency with predominantly antibody defects    Mycobacterium avium complex (CMS-HCC)    s/p Robotic right upper lobectomy with en bloc lower and middle lobe wedge resection, middle lobe wedge resection    Status post Hartmann procedure (CMS-HCC)    Ventral hernia without obstruction or gangrene    Colostomy status (CMS-HCC)    Status post cataract extraction and insertion of intraocular lens of right eye    Status post cataract extraction and insertion of intraocular lens of left eye    Type 2 diabetes mellitus without complication, without long-term current use of insulin     Pseudophakia of both eyes    Posterior vitreous detachment    S/P repair of ventral hernia    Ileostomy in place (CMS-HCC)    Unspecified mood (affective) disorder    Hyperlipidemia associated with type 2 diabetes mellitus (CMS-HCC)    Nonruptured cerebral aneurysm    Recurrent major depression in remission    Mediastinal lymphadenopathy    Osteopenia of lumbar spine    Recurrent depression    Hypertension associated with type 2 diabetes mellitus (CMS-HCC)    Chronic GERD       Vitals:    05/26/23 0757 05/26/23 1040   BP: 135/65 139/72   BP Location: Left arm    BP Patient Position: Sitting    Pulse: 60 69   Resp: 16 16   Temp: 98 F (36.7 C)    TempSrc: Temporal    SpO2: 99%    Weight: 68.2 kg (150 lb 6.4 oz)    Height: 5' 3 (1.6 m)      Pain Score: NA (pre med, non-pain or scheduled)  Body surface area is 1.74 meters squared.  Body mass index is 26.64 kg/m.    Pre-treatment nursing assessment:  No  problems identified upon assessment.    Tiffany Chen tolerated treatment well. Titrated per orders.  Observation: Not ordered    Post blood return: Brisk  Post-Flush: D5, Discontinued IV    Patient Education  Learner: Patient  Barriers to learning: No Barriers  Readiness to learn: Acceptance  Method: Explanation    Treatment Education: Information/teaching given to patient including: signs and symptoms of infection, bleeding, adverse reaction(s), symptom control, and when to notify MD.    [place] Prevention Education: Instructed patient to call for assistance.    Pain Education: Patient instructed to contact nurse if pain should develop or if their current pain therapy becomes ineffective.    Response: Verbalizes understanding    Discharge Plan  Discharge instructions given to patient.  Future appointments given and reviewed with treatment plan.  Discharge Mode: Ambulatory  Discharge Time: 1045  Accompanied by: Self  Discharged To: Home     Medications   dextrose  5% infusion (0 mL IntraVENOUS Completed 05/26/23 1040)   immune globulin  (human) (IgG) 30 g in 300 mL (GAMUNEX-C ) infusion (0 g IntraVENOUS Completed 05/26/23 1035)   acetaminophen  (TYLENOL ) tablet 650 mg (650 mg Oral Given 05/26/23 0802)   cetirizine  (ZYRTEC ) tablet 10 mg (10 mg Oral  Given 05/26/23 0802)

## 2023-05-26 NOTE — Progress Notes (Signed)
 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     On 02/24/23 she developed leg swelling>pain and found to have nonocclusive DVT of proximal calf vein (distal) without extension, felt to be provoked, started on Eliquis . Swelling resolved within 3-5 days of starting Eliquis .     Notes she is doing much better since surgery. Healing well. Brain fog lifted. Trying to increase walking, back to 2-4 miles some days of the week. No right leg pain or swelling.    GERD symptoms significantly improved since surgery. If she overeats or eat too fat she will feel heartburn symptoms, she has been trying to eat smaller portions and slow down her eating.    Since surgery, BP has been high in clinic visits. Used to be SBP 100-120s. She doesn't check Bps at home.     Few weeks ago she started noticing whooshing sounds in ears. Has increased water intake and whooshing sound resolved. Chronic tinnitus for years. No recent hearing changes. She has appt with audiology coming up.     She was taking iron every day since 2024. She stopped iron medication before her surgery.  No abd pain, constipation,  blood in stool, melena, unint weight loss.     Recently with diarrhea after surgery, added fiber supplement and probiotics    Today PHQ9 score 2, GAD7 2.     ROS: as above or otherwise neg     Patient Active Problem List   Diagnosis    Acquired tracheobronchomegaly with bronchiectasis (CMS-HCC)    MAI (mycobacterium avium-intracellulare) (CMS-HCC)    Cavitary lesion of lung    Gastroesophageal reflux disease, unspecified whether esophagitis present    Hoarse voice quality    Immunodeficiency with predominantly antibody defects    Mycobacterium avium complex (CMS-HCC)    s/p Robotic right upper lobectomy with en bloc lower and middle lobe wedge resection, middle lobe wedge resection    Status post Hartmann procedure (CMS-HCC)    Ventral hernia without obstruction or gangrene    Colostomy status (CMS-HCC)    Status post cataract extraction and insertion of intraocular lens of right eye    Status post cataract extraction and insertion of intraocular lens of left eye    Type 2 diabetes mellitus without complication, without long-term current use of insulin     Pseudophakia of both eyes    Posterior vitreous detachment    S/P repair of ventral hernia    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     Outpatient Medications Marked as Taking for the 05/27/23 encounter (Office Visit) with Iven Earnhart Kaur, DO   Medication Sig Dispense Refill    albuterol  (PROVENTIL ) (2.5 MG/3ML) 0.083% nebulization 3 mL (2.5 mg) by Nebulization route every 4 hours as needed for Wheezing. 150 mL 11     apixaban  (ELIQUIS ) 5 MG TABS Take 1 tablet (5 mg) by mouth 2 times daily for 45 days. 90 tablet 0    atorvastatin  (LIPITOR) 20 MG tablet Take 1 tablet (20 mg) by mouth daily. 90 tablet 1    biotin  1000 MCG tablet Take 1 tablet (1,000 mcg) by mouth daily.      blood glucose meter PHARMACY: Please dispense insurance approved bluetooth enable meter. 1 each 0    Cyanocobalamin (VITAMIN B 12 PO)       ferrous sulfate  325 (65 Fe) MG tablet Take 1 tablet (325 mg) by mouth daily.      fluoxetine  (PROZAC ) 40 MG capsule Take 1 capsule (40 mg) by mouth daily. 90 capsule 3    gabapentin  (NEURONTIN ) 250 MG/5ML solution Take 6 mL (300 mg) by mouth every 8 hours. 300 mL 0    glucose blood test strip 1 strip by Other route every morning (before breakfast). 100 strip 2    glucose blood test strip 1 strip by Other route 2 times daily (before meals). 100 strip 11    lancets 1 each by Other route every morning (before breakfast). 100 each 2    lancets 1 each by Other route 2 times daily (before meals). 100 Lancet. 5    metFORMIN  (RIOMET ) 500 MG/5ML oral solution Take 5 mL in the morning and 10 mL in the evening. 473 mL 0    Multiple Vitamin (MULTIVITAMIN) TABS tablet Take 1 tablet by mouth daily.      sodium chloride  7 % NEBU 4 mL by Nebulization route every 12 hours. 240 mL 11    sodium chloride  7 % NEBU USE 1 VIAL VIA NEBULIZER  EVERY 12 HOURS 720 mL 3    umeclidinium-vilanterol (ANORO ELLIPTA ) 62.5-25 MCG/ACT inhaler Inhale 1 puff by mouth daily. 60 each 11    VENTOLIN  HFA 108 (90 Base) MCG/ACT inhaler USE 2 INHALATIONS BY MOUTH  EVERY 6 HOURS AS NEEDED FOR WHEEZING 72 g 3    vitamin D3 125 MCG (5000 UT) capsule Take 1 capsule (5,000 Units) by mouth every 24 hours.      vitamin E  400 UNIT capsule Take 1 capsule (400 Units) by mouth daily.          Allergies   Allergen Reactions    Walnuts Swelling     Tongue swollen and burn        Past Surgical History:  Past Surgical History:   Procedure Laterality Date    BRONCHOSCOPY  12/2020     BRAIN SURGERY  12, 2021    CESAREAN SECTION, LOW TRANSVERSE      COLON SURGERY  2009    COLOSTOMY      CRANIOTOMY      LUNG LOBECTOMY          Social History:  Social History  Social History Narrative    Lives with brother [place] at home. Sister also lives in [place]        Family History:  Family History   Problem Relation Name Age of Onset    Cancer Mother Jenkins Macario Dutton ngs and throat    Heart Disease Mother Jenkins Macario         Congrestive heart failure due to cancer    Hypertension Mother Jenkins Macario     Psychiatry Mother Jenkins Macario         Bi polar, manic depressive    Cancer Father Elsie Macario         Prostate    Diabetes Father Elsie Macario     Stroke Father Elsie Macario     Stroke M Grandmother Winifred Macario     Cancer Brother [place]wood Macario         Skin (many locations)    Diabetes Brother Lynwood Macario         Exam::  BP 130/70   Pulse 78   Temp 97.9 F (36.6 C) (Temporal)   Ht 5' 3 (1.6 m)   Wt 67.4 kg (148 lb 11.2 oz)   SpO2 98%   BMI 26.34 kg/m   body mass index is 26.34 kg/m.  Const: well developed, NAD.   HEENT:  -Eyes-normal appearance, sclerae anicteric  CV: RRR, S1, S2, no murmurs, gallops, rubs, no lower extremity edema  Resp: CTAB, good effort, no accessory mm use.   GI: Soft, nontender, nondistended, normoactive bowel sounds, no masses, well healed incisions along lower abd  MS: No cyanosis. Normal muscle bulk.   Skin: No rashes. Warm, dry.  Neuro: Grossly alert. Moving all 4 extremities.   Psych: Euthymic affect. Fair insight     ASSESSMENT AND PLAN:   Ventral hernia without obstruction or gangrene  GERD  S/p laparoscopic Nissen fundoplication for GERD and repair of hiatal hernia 02/10/23   She underwent HH repair and fundoplication in Feb 2025 with improved GERD symptoms. Continue omeprazole  20mg  prn     Essential hypertension  Elevated BP today and recent office visits. No recent home BP readings. Will increase ramapril from 2.5mg  to 5mg  daily, BMP in 2 weeks, check home BP, RV 1 month.  Goal BP<120/<80 in setting of previous brain aneurysm  - ramipril  (ALTACE ) 5 MG capsule; Take 1 capsule (5 mg) by mouth daily.  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 Has since started Eliquis  with resolved right LE swelling and pain. Plan for 3 months of Eliquis  and then to stop.     Anemia, normocytic  Reviewed recent labs, next steps. Previously with anemia that resolved with iron supplementation. She denies abd pain, constipation, blood in stool, melena, unint weight loss. Will assess with labs below. Recommended colonoscopy. Recent B12 05/01/23 808.   - Colonoscopy Request; Future  - Ferritin, Blood Green Plasma Separator Tube; Future  - Iron, Blood Green Plasma Separator Tube; Future  - IBC - Iron Binding Capacity Green Plasma Separator Tube; Future  - TSH, Blood -  See Instructions; Future  - Comprehensive Metabolic Panel (CMP); Future  - LDH, Blood Green Plasma Separator Tube; Future  - Haptoglobin, Blood - See Instructions; Future  - Immunofixation, Blood Yellow serum separator tube; Future  - EPG, Serum Yellow serum separator tube; Future  - Stool Immunochemical Occult Blood; Future  - Copper , Serum/Plasma Royal Blue/Red Label; Future    Type 2 diabetes mellitus without complication, without long-term current use of insulin  (CMS-HCC)  Hypertension associated with type 2 diabetes mellitus (CMS-HCC)  Hyperlipidemia associated with type 2 diabetes mellitus (CMS-HCC)  Elevated BP  A1C 12/26/22 6.6, excellent control, repeat A1C in 6 months May-June 2025  Continue metformin  500 mg in AM and 1,000 mg in PM for now.     A. Retinopathy: Last eye exam 01/2022, encouraged to schedule appt for diabetic eye exam  B. Nephropathy: UMA last 12/26/22 2mg /dl, CMP 88/86/75 WNL.   C. Neuropathy: will complete at next visit  D. Lipids: Lipids LDL 40 10/14/22. Continue atorvastatin  20mg  daily  E. 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 Increase ramapril 2.5mg  to 5mg  daily. Encouraged to check home BP. She will have her sister check home BP. RV 1 month     Mycobacterium avium complex (CMS-HCC)  s/p Robotic right upper lobectomy with en bloc lower and middle lobe wedge resection, middle lobe wedge resection  S/P completion of antibiotic treatment 06/08/2022, S/P DA VINCI XI-Right Robotic upper lobectomy with middle and lower  lobe wedge resection on 03/20/2021.  EGD with [facility] 02/01/2021 showed significant reflux. She is now s/p surgical takedown of a colostomy followed by laparoscopic Nissen fundoplication for GERD and repair of hiatal hernia on 02/10/23. Continue airway clearance with nebulized hypertonic saline twice daily and regular exercise as she is doing. 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 Follows with pulmonary     Recurrent depression (CMS-HCC)  Stable. Continue fluoxetine  40mg  daily    RV 1 month    40 minutes spent total time on DOS on care coordination, excluding time spent related to any billed procedures. This time includes face-to-face time with the patient as well as time spent documenting in the medical record, reviewing patient's records and tests, obtaining history, placing orders, communicating with other healthcare professionals, counseling the patient, family, or caregiver, and/or care coordination for the diagnoses above.

## 2023-05-26 NOTE — Discharge Instructions (Signed)
 For any questions or concerns, you can call your Hematology/Oncology doctor's office, Monday - Friday, during business hours.   Please keep the clinic phone number in an easy to find place.     If you are unable to reach your nurse AND you have urgent symptoms, call [facility] at [phone] from 8 a.m. to 9 p.m, Monday through Friday.     For medical emergencies, please call 911    Symptoms that may require emergency care:    Allergic reaction: itching or hives, swelling in your face and hands, swelling or tingling in your mouth or throat, chest tightness, trouble breathing  Temperature greater than or equal to 100.5 degrees Farenheit  Sudden onset of chest pain or shortness of breath, worsening shortness of breath  Abdominal pain with nausea, vomiting, or fever  Numbness or weakness in your face or limbs  Lightheadedness, dizziness, or fainting  Unusual bleeding, bruising, or weakness    For urgent concerns after hours, weekdays, and holidays, you may call the on-call Hematology/Oncology doctor:    [facility]  [place] and [place]:  [phone]    [place] [place], and [place] [facility]  [facility]  [facility] at [address]  [phone]    Patients are advised to contact the infusion scheduling line at [phone] to coordinate appointments and ensure timely access to treatment.

## 2023-05-27 ENCOUNTER — Ambulatory Visit (INDEPENDENT_AMBULATORY_CARE_PROVIDER_SITE_OTHER): Payer: Medicare Other | Admitting: Internal Medicine

## 2023-05-27 VITALS — BP 130/70 | HR 78 | Temp 97.9°F | Ht 63.0 in | Wt 148.7 lb

## 2023-05-27 DIAGNOSIS — I824Y1 Acute embolism and thrombosis of unspecified deep veins of right proximal lower extremity: Secondary | ICD-10-CM

## 2023-05-27 DIAGNOSIS — K219 Gastro-esophageal reflux disease without esophagitis: Secondary | ICD-10-CM

## 2023-05-27 DIAGNOSIS — Z9889 Other specified postprocedural states: Secondary | ICD-10-CM

## 2023-05-27 DIAGNOSIS — I10 Essential (primary) hypertension: Secondary | ICD-10-CM

## 2023-05-27 DIAGNOSIS — D649 Anemia, unspecified: Secondary | ICD-10-CM

## 2023-05-27 DIAGNOSIS — Z1211 Encounter for screening for malignant neoplasm of colon: Secondary | ICD-10-CM

## 2023-05-27 MED ORDER — RAMIPRIL 5 MG OR CAPS
5.0000 mg | ORAL_CAPSULE | Freq: Every day | ORAL | 1 refills | Status: DC
Start: 2023-05-27 — End: 2023-11-03

## 2023-05-27 NOTE — Patient Instructions (Addendum)
 Call eye clinic for diabetic eye exam appointment    Increase ramapril to 5mg  daily. I sent new prescription for you. You may take two 2.5mg  tablets once a day of you current prescription until you receive new prescription.     Come in for labs in 2 weeks    Check home blood pressure when you are able and bring log with you to next appointment

## 2023-05-27 NOTE — Addendum Note (Signed)
 Encounter addended by: Matilde Norris on: 05/27/2023 3:05 AM   Actions taken: Charge Capture section accepted

## 2023-05-30 NOTE — Telephone Encounter (Signed)
 Called patient to schedule. No answer. I left a voicemail.

## 2023-06-05 NOTE — Telephone Encounter (Signed)
 Caller: Annarose  Relationship to patient: self  Phone # [phone]  Provider: Bambi Paci       What is the callers request?    Patient called back in to schedule RT Training, patient will be free today for a call if possible. Thank you

## 2023-06-09 ENCOUNTER — Ambulatory Visit (INDEPENDENT_AMBULATORY_CARE_PROVIDER_SITE_OTHER): Payer: Medicare Other | Admitting: Audiology

## 2023-06-09 DIAGNOSIS — H903 Sensorineural hearing loss, bilateral: Secondary | ICD-10-CM

## 2023-06-09 NOTE — Progress Notes (Signed)
 History  Patient has MAC lung disease and her Rx cocktail (Ethambutol , Rifampin , azithromycin ) was discontinued about 1 year ago.  She reported no change in hearing.  Patient reported several weeks ago that she noted what sounded like her refrigerator compressor going out, but the appliance checked out to be fine.  About 1 1/2 weeks later, she noted the sound was coming from her ears and sounded rhythmic.  She drank a lot of water, which stopped it.  Her BP Rx has since been adjusted and she has had no reoccurrence of the sound.  Denied dizziness, aural pain/pressure/drainage, ear surgery, head injury, and noise exposure.    Otoscopy  Non-occluding cerumen in the right EAC; TM was unremarkable.  Unremarkable left ear.    Results  Testing for the right ear indicated normal to borderline normal hearing [phone], sloping from very mild to mild sensorineural hearing loss 02-5998 Hz, further sloping to moderately-severe hearing loss at 8000 Hz.  Testing for the left ear indicated normal to borderline normal hearing [phone] Hz, sloping from mild to moderate sensorineural hearing loss 02-5998 Hz, further sloping to moderately-severe hearing loss at 8000 Hz.  No asymmetries were noted.  Speech discrimination was excellent (96% right and 100% left) at a soft conversational level in the right ear and a normal presentation level in the left ear.      Ultra High Frequency (UHF) testing for both ears showed moderately-severe/severe hearing loss 9-16,000 Hz with no responses at equipment limits 14-16,000 Hz in the right ear and 16,000 Hz in the left ear.    DNT immittance as patient was non-symptomatic for middle ear pathology.    Comparison to previous audio done 02/28/22 showed 15 dB poorer thresholds at 8000 Hz for both ears.  No other significant changes were noted for standard or UHFs.    Recommendations  Patient is a candidate for hearing aids in both ears.  Explained [facility] is currently only offering hearing aids to  patients whose insurance is specifically contracted with [facility] for hearing aids.  Will provide requested referrals outside [place] for hearing aids.  RHT as needed per perceived changes in hearing.    Audiogram graph is available for view under media.    Marval Remak, EMERSON LABOR., CCC/A  Sr. Audiologist  Licensed Dispensing Audiologist  [facility]

## 2023-06-10 ENCOUNTER — Encounter (INDEPENDENT_AMBULATORY_CARE_PROVIDER_SITE_OTHER): Payer: Self-pay | Admitting: Audiology

## 2023-06-10 ENCOUNTER — Other Ambulatory Visit: Attending: Internal Medicine

## 2023-06-10 DIAGNOSIS — D649 Anemia, unspecified: Secondary | ICD-10-CM | POA: Insufficient documentation

## 2023-06-10 DIAGNOSIS — I10 Essential (primary) hypertension: Secondary | ICD-10-CM | POA: Insufficient documentation

## 2023-06-10 LAB — COMPREHENSIVE METABOLIC PANEL, BLOOD
ALT (SGPT): 29 U/L (ref 0–33)
AST (SGOT): 30 U/L (ref 0–32)
Albumin: 4.2 g/dL (ref 3.5–5.2)
Alkaline Phos: 71 U/L (ref 40–130)
Anion Gap: 10 mmol/L (ref 7–15)
BUN: 21 mg/dL (ref 8–23)
Bicarbonate: 26 mmol/L (ref 22–29)
Bilirubin, Tot: 0.44 mg/dL (ref <1.2)
Calcium: 9.8 mg/dL (ref 8.5–10.6)
Chloride: 101 mmol/L (ref 98–107)
Creatinine: 0.86 mg/dL (ref 0.51–0.95)
Glucose: 129 mg/dL — ABNORMAL HIGH (ref 70–99)
Potassium: 4.4 mmol/L (ref 3.5–5.1)
Sodium: 137 mmol/L (ref 136–145)
Total Protein: 7.1 g/dL (ref 6.0–8.0)
eGFR Based on CKD-EPI 2021 Equation: >60 mL/min/1.73 m2

## 2023-06-10 LAB — IBC - IRON BINDING CAPACITY
Iron Saturation: 29 %
Iron: 115 ug/dL (ref 37–145)
Total IBC: 397 ug/dL (ref 148–506)
UIBC: 282 ug/dL (ref 112–346)

## 2023-06-10 LAB — HAPTOGLOBIN, BLOOD: Haptoglobin: 143 mg/dL (ref 30–200)

## 2023-06-10 LAB — FERRITIN, BLOOD: Ferritin: 53 ng/mL (ref 13–150)

## 2023-06-10 LAB — TSH, BLOOD: TSH: 3.09 u[IU]/mL (ref 0.27–4.20)

## 2023-06-10 LAB — LDH, BLOOD: LDH: 182 U/L — ABNORMAL HIGH (ref 25–175)

## 2023-06-10 NOTE — Interdisciplinary (Signed)
 Blood drawn from right arm with 23 gauge needle. 3 tubes taken.   Patient identity authenticated by Fran Lowes Rasch.

## 2023-06-11 LAB — EPG, INTERPRETATION, SERUM

## 2023-06-11 LAB — IMMUNOFIX INTERPRETATION, SERUM

## 2023-06-11 LAB — EPG, SERUM
A/G Ratio, EPG: 1.15
Albumin, EPG: 3.79 g/dL (ref 3.20–5.00)
Alpha 1, EPG: 0.2 g/dL (ref 0.10–0.40)
Alpha 2, EPG: 0.75 g/dL (ref 0.60–1.10)
Beta, EPG: 1.2 g/dL (ref 0.60–1.30)
Gamma, EPG: 1.17 g/dL (ref 0.70–1.50)
Total Protein, EPG: 7.1 g/dL (ref 6.00–8.00)

## 2023-06-11 LAB — COPPER, SERUM/PLASMA: Copper, Serum/Plasma: 99.3 ug/dL (ref 57.0–170.0)

## 2023-06-11 LAB — IMMUNOFIXATION, BLOOD

## 2023-06-11 NOTE — Telephone Encounter (Signed)
 Pt is calling back in regards to encounter below. Pt is very upset they only received 1 call. Please assist. Thank you     CB: [phone]

## 2023-06-12 NOTE — Telephone Encounter (Signed)
 Patient has been called and appt has been scheduled for RT teaching 06/11. Patient advise to bring all necessary equipment.   closing

## 2023-06-13 ENCOUNTER — Ambulatory Visit: Attending: Pulmonary Disease | Admitting: Pulmonary Disease

## 2023-06-13 ENCOUNTER — Encounter (HOSPITAL_BASED_OUTPATIENT_CLINIC_OR_DEPARTMENT_OTHER): Payer: Self-pay | Admitting: Pulmonary Disease

## 2023-06-13 ENCOUNTER — Encounter (INDEPENDENT_AMBULATORY_CARE_PROVIDER_SITE_OTHER): Payer: Self-pay | Admitting: Hospital

## 2023-06-13 DIAGNOSIS — I82461 Acute embolism and thrombosis of right calf muscular vein: Secondary | ICD-10-CM | POA: Insufficient documentation

## 2023-06-13 NOTE — Progress Notes (Signed)
 Identification  Tiffany Chen was diagnosed with deep venous thrombosis on the date of 02/24/2023 at [facility].  The acute thrombosis temporally associated with the transient surgical provoking factor of general surgery.    Presentation  According to the patient, Tiffany Chen was a 78 year old female with a history of bronchiectasis, gastroesophageal reflux disease (GERD), and diverticulitis, who had undergone a colostomy creation in 2009. She was recently admitted to [facility] for a colostomy takedown and had a subsequent hernia repair. She was discharged with instructions to follow a liquid diet for two weeks. Ten days later, on February 24, 2023, she presented to the emergency room due to right leg swelling.    The patient reported no significant pain or recent trauma to the area. She noted that her right ankle was significantly more swollen than the left when she attempted to wear her shoes that morning. She denied any history of cardiac problems and was not experiencing shortness of breath, chest pain, or pain in the ankle or knee at the time. She was able to ambulate and bear weight on the affected leg.    The diagnosis was made approximately 2 days after the onset of symptoms,.    Diagnosis and Severity  Lower extremity deep venous thrombosis was diagnosed by ultrasonography which disclosed evidence of DVT in the right calf vein.    Provoking Factors  At the time that acute VTE occurred, the patient had recently had colostomy takedown surgery. She had no recent injuries to the lower extremities.  The clot did not occur in the setting of known active malignancy. The patient was using no hormonal therapy at or recently before the time of the acute thrombosis.    She had no central intravenous catheters associated with the acute thrombosis.  Prior to this episode, she had no previous history of venous thrombo-embolism. Her family history disclosed no immediate relatives with venous  thromboembolism.    Factors Associated with Slow or Incomplete Clot Resolution  She had no history of splenectomy, ventriculo-atrial shunt, indwelling pacemaker, chronic inflammation, hyperthyroidism, abdominal surgery, prominent varicose veins or antiphospholipid antibodies associated with the acute thrombosis.    Other Relevant Clinical Factors   She has no preexisting cardiopulmonary disease.  BMI is Body mass index is 26.39 kg/m.Tiffany Chen She had no history of smoking prior to the acute thrombosis.    Bleeding Risk  The patient has no high-risk factors for bleeding.  Within the last 6 weeks, she had no major bleeding episodes while taking anticoagulation.    Related Laboratory Studies   No hypercoagulability labs.    Characterization of the VTE Treatment  Treatment for the acute episode began on on the day of the diagnosis.  The initial anticoagulation treatment consisted of apixaban  10 mg orally bid.  She  did not receive thrombolytic medications, did not undergo catheter assisted embolectomy, did not have an IVC filter placed, did not undergo surgical embolectomy and did not undergo other interventional procedures during the acute episode. The acute treatment was not complicated by clinically important bleeding, thrombocytopenia or other problems. The duration of the initial; anticoagulant treatment was 7 days and there is no evidence that the treatment was interrupted for a significant amount of time. She was begun on apixaban  5 mg orally bid and remained on that regimen for 3 months. There is no evidence that she was sub-therapeutic for more than one week during the therapy. While talking long-term anticoagulation, she had no clinically significant adverse effects.  Current Condition  Symptoms suggestive of the residua of PE are not present. Symptoms suggestive of coexisting respiratory condition(s) are not present.  Symptoms suggestive of the residua of DVT are not present. Symptoms suggestive of coexisting  extremity problems are not present.      The patient's exercise tolerance is fully recovered to the level that existed prior to the acute thrombosis.  Specifically, during the few weeks prior to the thrombosis, the patient now recalls getting breathless only with strenuous exercise  (mMRC 0).  By comparison, during the week prior to today's visit, the patient recalls getting breathless only with strenuous exercise  (mMRC 0).  Currently, she is capable of walking and gardening with no shortness of breath.  The current [place]  Heart Association Functional Class is I (no symptoms and no limitation in ordinary physical activity, e.g. shortness of breath when walking, climbing stairs etc.).    Her plans include no surgical interventions or other situations at high risk for bleeding    Overview of Her Other Medical Problems  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     Past Surgical History:   Procedure Laterality Date    BRONCHOSCOPY   12/2020    BRAIN SURGERY  12, 2021    CESAREAN SECTION, LOW TRANSVERSE      COLON SURGERY  2009    COLOSTOMY      CRANIOTOMY      LUNG LOBECTOMY       Current Outpatient Medications on File Prior to Visit   Medication Sig Dispense Refill    albuterol  (PROVENTIL ) (2.5 MG/3ML) 0.083% nebulization 3 mL (2.5 mg) by Nebulization route every 4 hours as needed for Wheezing. 150 mL 11    atorvastatin  (LIPITOR) 20 MG tablet Take 1 tablet (20 mg) by mouth daily. 90 tablet 1    biotin  1000 MCG tablet Take 1 tablet (1,000 mcg) by mouth daily.      blood glucose meter PHARMACY: Please dispense insurance approved bluetooth enable meter. 1 each 0    Cyanocobalamin (VITAMIN B 12 PO)       ferrous sulfate  325 (65 Fe) MG tablet Take 1 tablet (325 mg) by mouth daily.      fluoxetine  (PROZAC ) 40 MG capsule Take 1 capsule (40 mg) by mouth daily. 90 capsule 3    gabapentin  (NEURONTIN ) 250 MG/5ML solution Take 6 mL (300 mg) by mouth every 8 hours. (Patient not taking: Reported on 06/13/2023) 300 mL 0    glucose blood test strip 1 strip by Other  route every morning (before breakfast). 100 strip 2    glucose blood test strip 1 strip by Other route 2 times daily (before meals). 100 strip 11    lancets 1 each by Other route every morning (before breakfast). 100 each 2    lancets 1 each by Other route 2 times daily (before meals). 100 Lancet. 5    metFORMIN  (RIOMET ) 500 MG/5ML oral solution Take 5 mL in the morning and 10 mL in the evening. 473 mL 0    Multiple Vitamin (MULTIVITAMIN) TABS tablet Take 1 tablet by mouth daily.      ramipril  (ALTACE ) 5 MG capsule Take 1 capsule (5 mg) by mouth daily. 90 capsule 1    sodium chloride  7 % NEBU 4 mL by Nebulization route every 12 hours. 240 mL 11    sodium chloride  7 % NEBU USE 1 VIAL VIA NEBULIZER  EVERY 12 HOURS 720 mL 3    umeclidinium-vilanterol (ANORO ELLIPTA ) 62.5-25 MCG/ACT inhaler Inhale 1 puff by mouth daily. 60 each 11    VENTOLIN  HFA 108 (90 Base) MCG/ACT inhaler USE 2 INHALATIONS BY MOUTH   EVERY 6 HOURS AS NEEDED FOR WHEEZING 72 g 3    vitamin D3 125 MCG (5000 UT) capsule Take 1 capsule (5,000 Units) by mouth every 24 hours.      vitamin E  400 UNIT capsule Take 1 capsule (400 Units) by mouth daily.       No current facility-administered medications on file prior to visit.     Sheis allergic to walnuts.   Family History   Problem Relation Name Age of Onset    Cancer Mother Jenkins Macario Dutton ngs and throat    Heart Disease Mother Jenkins Macario         Congrestive heart failure due to cancer    Hypertension Mother Jenkins Macario     Psychiatry Mother Jenkins Macario         Bi polar, manic depressive    Cancer Father Elsie Macario         Prostate    Diabetes Father Elsie Macario     Stroke Father Elsie Macario     Stroke M Grandmother Winifred Macario     Cancer Brother Lynwood Macario         Skin (many locations)    Diabetes Brother Lynwood Macario         OBJECTIVE  Physical Exam  Vitals:    06/13/23 0823   BP: (!) 151/57   BP Location: Left arm   BP Patient Position: Sitting   BP cuff size: Regular   Pulse: 83   Resp: 17   Temp: 97.1 F (36.2 C)   TempSrc: Temporal   SpO2: 96%   Weight: 67.6 kg (149 lb)   Height: 5' 3 (1.6 m)     General: alert, cooperative, and no distress  Heart: regular rate and rhythm; S1, S2 normal; no murmur, click, rub or gallop.  Physiological split of S2.  No accentuation of P2.  Lungs/Thorax: chest clear, no wheezing, rales, normal symmetric air entry, no tachypnea, retractions or cyanosis, no chest wall deformities or tenderness  Abdomen: soft, non-tender. Bowel sounds normal. No masses, no organomegaly  Extremities: upper and lower extremities without clubbing cyanosis or edema     Laboratory Review  Lab Results   Component Value Date    WBC 7.4 02/24/2023    HGB 10.2 (L) 02/24/2023    HCT  31.3 (L) 02/24/2023    PLT 369 02/24/2023     Lab Results   Component Value Date    NA 137 06/10/2023    K 4.4 06/10/2023    CL 101 06/10/2023    BICARB 26 06/10/2023    BUN 21 06/10/2023    CREAT 0.86 06/10/2023     GLU 129 (H) 06/10/2023    CA 9.8 06/10/2023     Lab Results   Component Value Date    PT 11.3 02/24/2023    PTT 32 02/24/2023    INR 1.0 02/24/2023       US  LE (02/24/2023):  Nonocclusive thrombus in the right calf vein. No extension into the popliteal vein.     ASSESSMENT/PLAN  Assessment  1) Certainty of the diagnosis  The objective tests that have been performed confirm the diagnosis of acute deep venous thrombosis. No further work up is necessary to corroborate the acute diagnosis.  2) Factors associated with the acute thrombosis.    The patient's acute thrombosis was likely provoked by general surgery that recently preceded the presentation of thrombosis.  3) Risk of recurrence  The risk of recurrence after three months of anticoagulant therapy should be small  4) Recovery from the acute thrombosis  It has been 4 months since the diagnosis of the acute thrombosis.  Symptoms: The patient appears to have made a full clinical recovery from the thrombosis.  Exercise test with CPET: not indicated  Arterial perfusion with V/Q scan: not indicated  Resting  echocardiography: not indicated  Confirmatory imaging: not indicated  Hemodynamic measurement: not indicated  5) Bleeding risk  Anticoagulation would pose a risk of bleeding comparable to other patients treated similarly.  6) Values and preferences  The patient's values and concerns are most focused on a balance of prevention of recurrence and avoidance of bleeding.  Recommendations  1) Further work-up regarding the presence and etiology of the acute thrombosis  The patient should receive an age-appropriate screening for malignancy through the primary care physician  2) Follow up studies  No further testing needed.  3) Therapy  We discussed the benefits (i.e. reduction in risk for recurrent venous thromboembolism) and risks (i.e. bleeding complications) of anticoagulation. We discussed the various options for anticoagulation including warfarin, enoxaparin , direct  oral anticoagulants (i.e. rivaroxaban, apixiban, dibigatran) and other options. Based on the patient values and preferences, clinical circumstances and other factors that we considered together, anticoagulation therapy will be discontinued given she has completed more than three months of anticoagulation for provoked deep vein thrombosis.      I personally spent 35 total minutes in face-to-face and non-face-to-face activities related to the patient's visit today, excluding any separately reportable services/procedures.    Evalene HERO. Donnel, M.D., M.P.H.  Director of Comprehensive Pulmonary Embolism Care  Clinical Professor of Medicine  Division of Pulmonary and Critical Care Medicine  Pager [phone]# [phone]

## 2023-06-13 NOTE — Patient Instructions (Signed)
 No need for anticoagulation. No further follow up with me but continue to follow up with Dr. Calvin.     Evalene HERO. Donnel, M.D., M.P.H.  Director of Comprehensive Pulmonary Embolism Care  Clinical Professor of Medicine  Division of Pulmonary and Critical Care Medicine  Pager [phone]# [phone]

## 2023-06-16 ENCOUNTER — Encounter (INDEPENDENT_AMBULATORY_CARE_PROVIDER_SITE_OTHER): Admitting: Nurse Practitioner

## 2023-06-16 NOTE — Progress Notes (Deleted)
 PULMONARY OUTPATIENT CLINIC FOLLOW-UP NOTE    No chief complaint on file.      History of Present Illness:  Tiffany Chen is a 78 year old female with history of bronchiectasis, cavitary MAC lung infection, ruptured diverticulitis s/p colostomy in 2009, DM, ICH s/p embolization. She is here to follow up on bronchiectasis and MAC lung infection.      Patient's last visit on 04/16/2023  with Dr. Bambi to follow up on bronchiectasis and MAC lung infection. I saw Tiffany Chen the last on 08/26/2022 to follow up on bronchiectasis and MAC lung infection. She states doing well from a respiratory stand point since last visit. She denies ED visits or recent hospitalizations.       Tiffany Chen has history of MAC lung infection treated multiple times with azithromycin  500mg , Rifampin  600mg  , ethambutol  1600mg  three times weekly: 2018- 01/19/2018, 05/25/2018- 10/22/2019 and 12/22/2019- 02/16/2020.     As per last note from Dr. Bambi:   In late 2021, CT scan showed enlarging cavity. December 2021 a bronch was performed but no MAC growth. Her doctor restarted azithro/Rifampin /ethmabutol based on imaging.   Feb 2022 she saw Dr. Lorella Rhodes who stopped the antibiotics based on the lack of microbiologic data. Performed repeat bronch 04/28/2020 which has smear AFB 3+ and culture MAC (drug susc pending).      07/25/2019 - she was strongly recommended to start new round of MAC antibiotic therapy due to the severity of the lung cavitation and severity of her symptoms. The regimen recommended was IV antibiotic (Amikacin ) home infusion for 3 months, azithromycin  250 mg daily, ethambutol  800 mg daily and rifampin  600 mg daily.       08/14/2020 - She started on MAC antibiotic therapy with tentative plan to stop IV amikacin  on 11/24/2020 and replaced with arikayce . Her IV was removed on 11/24/2020 and also discussed the possibility of right upper lobe lobectomy.    11/23/2020 - she started on Arikayce  nebs every day. She reports  mild sore throat with Arykace but was able to tolerate with hot tea.       01/17/2021 - she was added clofazimine  to her regimen.  In addition to azithro/rifampin /ethambutol  and Arikayce . She also was referred to Dr. Tyrone for consideration for right upper and middle lobectomy. She was scheduled for RUL lobectomy on 03/20/2021.      02/21/2021 _ She was added clofazimine .      03/20/2021 -  She have had Right Robotic upper lobectomy with middle and lower  lobe wedge resection.      06/08/2022 - She completed MAC antibiotic therapy. Her regiment consisted on: azithromycin , rifampin , ethambutol , clofazimine  (which was started 02/21/2021) and inhaled Arikayce .     CT chest from 05/20/2022 with worsening waxing and waning moderate mediastinal or hilar lymphadenopathy. She was evaluated by oncology and IP. She will have EBUS with LN TBNA, LN cryobiopsy, BAL .     Today, Tiffany Chen states feeling well at her baseline. She reports symptom improvement  after MAC antibiotic therapy. She reports intermittent non productive cough. She is no able to produce phlegm for respiratory culture. She has not been doing air way clearance. She denies wheezing or dyspnea. She denies hemoptysis, fevers, chills, nausea, vomiting or night sweats. She reports persistent fatigue. She also reports weight loss. She is currently capable to walk more several miles without feeling winded.   She denies feeling winded when going uphill or on stairs.  She denies chest congestion, orthopnea, PND  lower extremity edema. She reports history of palpitations. She denies allergic rhinitis, sinus problems or other URI. She has history of GERD controlled with diet and PPI. She have had influenza vaccine on 10/2021 and competed COVID vaccines with 5 booster. .      She continues using Anoro once a day.     Review of Systems:   A full  review of systems was performed and the pertinent positives and negatives were mentioned in the HPI, all other review of systems  were negative.     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       Past Surgical History:   Procedure Laterality Date    BRONCHOSCOPY  12/2020    BRAIN SURGERY  12, 2021    CESAREAN SECTION, LOW TRANSVERSE      COLON SURGERY  2009    COLOSTOMY      CRANIOTOMY      LUNG LOBECTOMY         Family History   Problem Relation Name Age of Onset    Cancer Mother Jenkins Macario Dutton ngs and throat    Heart Disease Mother Jenkins Macario         Congrestive heart failure due to cancer    Hypertension Mother Jenkins Macario     Psychiatry Mother Jenkins Macario         Bi polar, manic depressive    Cancer Father Elsie Macario         Prostate    Diabetes Father Elsie Macario     Stroke Father Elsie Macario     Stroke M Grandmother Winifred Macario     Cancer Brother Lynwood Macario         Skin (many locations)    Diabetes Brother Lynwood Macario        none     Socioeconomic History    Marital status: Widowed   Tobacco Use    Smoking status: Never    Smokeless tobacco: Never   Vaping  Use    Vaping status: Never Used   Substance and Sexual Activity    Alcohol use: Yes     Comment: glass of wine every other month    Drug use: Not Currently    Sexual activity: Not Currently   Other Topics Concern    Military Service No    Blood Transfusions No    Caffeine Concern No    Occupational Exposure No    Hobby Hazards Yes  Comment: Gardening, MAC    Sleep Concern No    Stress Concern Yes     Comment: Husband death Cov84, 09/03/2018, sold home, rehomed pets. [place]    Weight Concern No    Special Diet Yes    Back Care No    Exercises Regularly Yes    Bike Helmet Use No    Seat Belt Use No    Performs Self-Exams No   Social History Narrative    Lives with brother Lynwood at home. Sister also lives in [place]       Allergies   Allergen Reactions    Walnuts Swelling     Tongue swollen and burn       Current Outpatient Medications on File Prior to Visit   Medication Sig Dispense Refill    albuterol  (PROVENTIL ) (2.5 MG/3ML) 0.083% nebulization 3 mL (2.5 mg) by Nebulization route every 4 hours as needed for Wheezing. 150 mL 11    atorvastatin  (LIPITOR) 20 MG tablet Take 1 tablet (20 mg) by mouth daily. 90 tablet 1    biotin  1000 MCG tablet Take 1 tablet (1,000 mcg) by mouth daily.      blood glucose meter PHARMACY: Please dispense insurance approved bluetooth enable meter. 1 each 0    Cyanocobalamin (VITAMIN B 12 PO)       ferrous sulfate  325 (65 Fe) MG tablet Take 1 tablet (325 mg) by mouth daily.      fluoxetine  (PROZAC ) 40 MG capsule Take 1 capsule (40 mg) by mouth daily. 90 capsule 3    gabapentin  (NEURONTIN ) 250 MG/5ML solution Take 6 mL (300 mg) by mouth every 8 hours. (Patient not taking: Reported on 06/13/2023) 300 mL 0    glucose blood test strip 1 strip by Other route every morning (before breakfast). 100 strip 2    glucose blood test strip 1 strip by Other route 2 times daily (before meals). 100 strip 11    lancets 1 each by Other route every morning (before breakfast). 100 each 2    lancets 1 each by  Other route 2 times daily (before meals). 100 Lancet. 5    metFORMIN  (RIOMET ) 500 MG/5ML oral solution Take 5 mL in the morning and 10 mL in the evening. 473 mL 0    Multiple Vitamin (MULTIVITAMIN) TABS tablet Take 1 tablet by mouth daily.      ramipril  (ALTACE ) 5 MG capsule Take 1 capsule (5 mg) by mouth daily. 90 capsule 1    sodium chloride  7 % NEBU 4 mL by Nebulization route every 12 hours. 240 mL 11    sodium chloride  7 % NEBU USE 1 VIAL VIA NEBULIZER  EVERY 12 HOURS 720 mL 3    umeclidinium-vilanterol (ANORO ELLIPTA ) 62.5-25 MCG/ACT inhaler Inhale 1 puff by mouth daily. 60 each 11    VENTOLIN  HFA 108 (90 Base) MCG/ACT inhaler USE 2 INHALATIONS BY MOUTH  EVERY 6 HOURS AS NEEDED FOR WHEEZING 72 g 3    vitamin D3 125 MCG (5000 UT) capsule Take 1 capsule (5,000 Units) by mouth every 24 hours.      vitamin E  400 UNIT capsule Take 1 capsule (400 Units) by mouth daily.       No current facility-administered medications on file prior to visit.       Physical Examination:  There were no vitals taken for this visit.           General: well appearing alert and oriented x 3, in nad.  Oropharynx:  without any lesions  Heart:  Regular rate and rhythm, normal S1 S2, no murmurs.  Lungs: clear to auscultation, right upper lobe wheezing, rales, rhonchi, no chest deformities noted. Normal I:E ratio  Extremities:  no cyanosis, clubbing, or edema.  Skin:  No rashes or lesions.  Psych: normal affect and mood       I have reviewed the following  laboratory data and other diagnostic studies:   CBC:  Lab Results   Component Value Date    WBC 7.4 02/24/2023    HGB 10.2 (L) 02/24/2023    HCT 31.3 (L) 02/24/2023    PLT 369 02/24/2023     CHEM:  Lab Results   Component Value Date    NA 137 06/10/2023    K 4.4 06/10/2023    CL 101 06/10/2023    BICARB 26 06/10/2023    BUN 21 06/10/2023    CREAT 0.86 06/10/2023    GLU 129 (H) 06/10/2023    CA 9.8 06/10/2023     COAG:  Lab Results   Component Value Date    PT 11.3 02/24/2023    PTT 32  02/24/2023    INR 1.0 02/24/2023     LFTs:  Lab Results   Component Value Date    AST 30 06/10/2023    ALT 29 06/10/2023    LDH 182 (H) 06/10/2023    ALK 71 06/10/2023    TP 7.1 06/10/2023    ALB 4.2 06/10/2023    TBILI 0.44 06/10/2023    DBILI <0.2 12/26/2022          Review of Radiology Studies:    Chest x ray (03/20/2021):  FINDINGS:  Lines and Tubes: Large-bore right-sided chest tube seen in place with some subcutaneous emphysema  Mediastinum: The cardiomediastinal silhouette is within normal limits. No lymphadenopathy is appreciated.  Lungs: The lungs are clear. Status post right upper lobe lobectomy with re-expansion of the lung tissue.  Pleura: No pneumothorax or effusion.  Bones and soft tissues: Unchanged    IMPRESSION:  Right-sided large-bore chest tube with no evidence of pneumothorax. Status post right upper lobectomy    CT chest (11/20/2022):  FINDINGS:  Lines and Tubes: None     Thyroid  and thoracic inlet: Normal.  Lymph nodes: Stable mediastinal adenopathy..  Heart: Stable small pericardial effusion. Coronary artery calcification.  Vasculature: Normal.  Esophagus: Normal.     Lung: Stable postsurgical changes right lung. Unchanged clustered nodular opacities throughout both lungs.  Pleura: Normal.     Abdomen: Normal.  Bone and soft tissue: Unchanged lytic lesion right T10 pedicle with smooth sclerotic margins compared to 2022.    IMPRESSION:  Stable chest including mediastinal adenopathy and clustered pulmonary nodules throughout both lungs compatible with history of nontuberculous mycobacterial disease. No new acute pulmonary process. Continued follow-up is advised.     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 MRI could be performed as clinically indicated.    CT chest (05/20/2022):  FINDINGS:     LINES AND TUBES: None.     THYROID  AND THORACIC INLET: Unremarkable.     LYMPH NODES: Waxing and waning  moderate mediastinal and hilar lymphadenopathy, slightly increased.     CARDIOVASCULAR: Unchanged aortic and coronary calcification.     PERICARDIUM: Unremarkable.     ESOPHAGUS: Unremarkable.     LUNG: Postoperative changes in the right hemithorax again noted, with expected evolution. Multifocal cylindrical bronchiectasis with  associated clustered tree-in-bud nodularity, not substantially changed compared to the last study. Patent central airways.     PLEURA: Unremarkable.     ABDOMEN: Unchanged upper abdomen.     BONE AND SOFT TISSUE: Mild disc degeneration. No suspicious bony lesions. Diffuse osteopenia, as before.    IMPRESSION:  Nonspecific, waxing and waning moderate mediastinal or hilar lymphadenopathy. CT follow-up in 6 months is suggested for re-evaluation. If lymphadenopathy worsens at that time, consider workup for underlying hematological malignancy.     Expected evolution of postoperative changes in the right hemithorax.     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      6 MIN WALK (01/30/2021):      PFT (08/17/2021):      ASSESSMENT AND PLAN:  Tiffany Chen is a 78 year old female with history of bronchiectasis, cavitary MAC lung infection, ruptured diverticulitis s/p colostomy in 2009, DM, ICH s/p embolization. She is here to follow up on bronchiectasis and MAC lung infection.     # Bronchiectasis/ Cavitary MAC lung infection: currently stable and asymptomatic. She was advised to start MAC antibiotic therapy due to the severity of her lesions and symptoms. She started on  IV antibiotic Amikacin  home infusion for 3 months (08/14/2020 to 11/24/2020), azithromycin  250 mg daily, ethambutol  800 mg daily and rifampin  600 mg daily.  She was started on inhaled Arikayce  on 11/23/2020.  Clofazimine  was added on 1/11/20223. She have had right Robotic upper lobectomy with middle and lower  lobe wedge resection on 03/20/2021. She reports  improved condition with current MAC antibiotic therapy. She completed MAC antibiotic therapy on 06/08/2022. [name] She reports improved pulmonary condition after completing MAC antibiotics. PFT from 08/17/2021 wnl.[name] PFT from 02/16/2021 with ( FVC 2.00- 79%, FEV 1 1.60 - 85%, FEV1/FVC 80, DLCOc 80%).  I have discussed air way clearance. I have recommended her to continue with air way clearance once a day with saline nebulizer solution followed by acapella device . Patient advised to continue with Anoro once a day. No new orders for today.  ED precautions given.     # Mediastinal Lymphadenopahty: as seen on recent CT Chest from 05/20/2022 with worsening waxing and waning moderate mediastinal or hilar lymphadenopathy. She was evaluated by oncology and IP. She will have EBUS  (08/29/2022)with LN TBNA, LN cryobiopsy, BAL.    # Immunizations: up to date.     I personally spend a total of 60 Minutes in face to face and non face to face activities related to patient's visit today, excluding and separately reportable services/procedures.    Follow up with Dr. Elmaraachli in 2 months.     Richie Purvis Fendt NP-BC  Pulmonary Medicine

## 2023-06-18 ENCOUNTER — Other Ambulatory Visit (INDEPENDENT_AMBULATORY_CARE_PROVIDER_SITE_OTHER)

## 2023-06-18 ENCOUNTER — Telehealth (HOSPITAL_BASED_OUTPATIENT_CLINIC_OR_DEPARTMENT_OTHER): Payer: Self-pay | Admitting: Critical Care Medicine

## 2023-06-18 NOTE — Telephone Encounter (Signed)
 Pt has called in to reschedule her appointment for today for the training on her Nebulizer.    Please call     Ph. [phone]

## 2023-06-19 ENCOUNTER — Encounter (INDEPENDENT_AMBULATORY_CARE_PROVIDER_SITE_OTHER): Admitting: Nurse Practitioner

## 2023-06-23 ENCOUNTER — Ambulatory Visit
Admission: RE | Admit: 2023-06-23 | Discharge: 2023-06-23 | Disposition: A | Discharge status: Home / Self Care | Attending: Allergy | Admitting: Allergy

## 2023-06-23 VITALS — BP 139/73 | HR 69 | Temp 97.6°F | Resp 18 | Wt 149.3 lb

## 2023-06-23 DIAGNOSIS — D809 Immunodeficiency with predominantly antibody defects, unspecified: Secondary | ICD-10-CM | POA: Insufficient documentation

## 2023-06-23 LAB — IGG, BLOOD: IGG: 891 mg/dL (ref 700–1600)

## 2023-06-23 MED ORDER — IMMUNE GLOBULIN (HUMAN) 20 GM/200ML IJ SOLN
0.5500 g/kg | Freq: Once | INTRAMUSCULAR | Status: AC
Start: 2023-06-23 — End: 2023-06-23
  Administered 2023-06-23: 30 g via INTRAVENOUS
  Filled 2023-06-23: qty 200

## 2023-06-23 MED ORDER — ACETAMINOPHEN 325 MG PO TABS
650.0000 mg | ORAL_TABLET | Freq: Once | ORAL | Status: AC
Start: 2023-06-23 — End: 2023-06-23
  Administered 2023-06-23: 650 mg via ORAL
  Filled 2023-06-23: qty 2

## 2023-06-23 MED ORDER — DEXTROSE 5 % IV SOLN
INTRAVENOUS | Status: DC
Start: 2023-06-23 — End: 2023-06-23

## 2023-06-23 MED ORDER — CETIRIZINE HCL 10 MG OR TABS
10.0000 mg | ORAL_TABLET | Freq: Once | ORAL | Status: AC
Start: 2023-06-23 — End: 2023-06-23
  Administered 2023-06-23: 10 mg via ORAL
  Filled 2023-06-23: qty 1

## 2023-06-23 MED ORDER — DIPHENHYDRAMINE HCL 25 MG OR TABS OR CAPS CUSTOM
25.0000 mg | ORAL_CAPSULE | Freq: Once | ORAL | Status: DC | PRN
Start: 2023-06-23 — End: 2023-06-23

## 2023-06-23 NOTE — Interdisciplinary (Signed)
 Non-Chemotherapy Infusion Nursing Note -  [facility]    Tiffany Chen is a 78 year old female who presents for infusion of IVIG 30 G.    Patient Active Problem List   Diagnosis    Acquired tracheobronchomegaly with bronchiectasis (CMS-HCC)    MAI (mycobacterium avium-intracellulare) (CMS-HCC)    Cavitary lesion of lung    Gastroesophageal reflux disease, unspecified whether esophagitis present    Hoarse voice quality    Immunodeficiency with predominantly antibody defects    Mycobacterium avium complex (CMS-HCC)    s/p Robotic right upper lobectomy with en bloc lower and middle lobe wedge resection, middle lobe wedge resection    Status post Hartmann procedure (CMS-HCC)    Ventral hernia without obstruction or gangrene    Colostomy status (CMS-HCC)    Status post cataract extraction and insertion of intraocular lens of right eye    Status post cataract extraction and insertion of intraocular lens of left eye    Type 2 diabetes mellitus without complication, without long-term current use of insulin     Pseudophakia of both eyes    Posterior vitreous detachment    S/P repair of ventral hernia    Ileostomy in place (CMS-HCC)    Unspecified mood (affective) disorder    Hyperlipidemia associated with type 2 diabetes mellitus (CMS-HCC)    Nonruptured cerebral aneurysm    Recurrent major depression in remission    Mediastinal lymphadenopathy    Osteopenia of lumbar spine    Recurrent depression    Hypertension associated with type 2 diabetes mellitus (CMS-HCC)    Chronic GERD       Vitals:    06/23/23 0917 06/23/23 0947 06/23/23 1020 06/23/23 1038   BP: 129/77 118/70 129/74 139/73   BP Location:    Right arm   BP Patient Position:    Semi-Fowlers   Pulse: 72 67 77 69   Resp:    18   Temp:    97.6 F (36.4 C)   TempSrc:    Temporal   SpO2:    97%   Weight:         Pain Score: 0  Body surface area is 1.73 meters squared.  Body mass index is 26.44 kg/m.    Pre-treatment nursing assessment:  No problems  identified upon assessment. Pt arrived ambulatory with no c/o pain, sob, fevers, cough, N/V/C/D. PIV inserted in L Hand. +BR, labs drawn and sent. Pt premedicated as per order.     Tiffany Chen tolerated treatment well.  Observation: Not ordered    Post blood return: Brisk  Post-Flush: NS and Discontinued IV    Patient Education  Learner: Patient  Barriers to learning: No Barriers  Readiness to learn: Acceptance  Method: Explanation    Treatment Education: Information/teaching given to patient including: signs and symptoms of infection, bleeding, adverse reaction(s), symptom control, and when to notify MD.    [place] Prevention Education: Instructed patient to call for assistance.    Pain Education: Patient instructed to contact nurse if pain should develop or if their current pain therapy becomes ineffective.    Response: Verbalizes understanding    Discharge Plan  Discharge instructions given to patient.  Future appointments given and reviewed with treatment plan.  Discharge Mode: Ambulatory  Discharge Time: 1047  Accompanied by: Self  Discharged To: Home     Medications   dextrose  5% infusion (0 mL IntraVENOUS Stopped 06/23/23 1045)   diphenhydrAMINE  (BENADRYL ) tablet or capsule 25 mg (25 mg  Oral Not given 06/23/23 0819)   acetaminophen  (TYLENOL ) tablet 650 mg (650 mg Oral Given 06/23/23 0817)   cetirizine  (ZYRTEC ) tablet 10 mg (10 mg Oral Given 06/23/23 0817)   immune globulin  (human) (IgG) 30 g in 300 mL (GAMUNEX-C ) infusion (0 g IntraVENOUS Completed 06/23/23 1038)

## 2023-06-23 NOTE — Addendum Note (Signed)
 Encounter addended by: Flynn Keturah HERO, RN on: 06/23/2023 4:38 PM   Actions taken: Flowsheet accepted

## 2023-06-24 NOTE — Addendum Note (Signed)
 Encounter addended by: Gjergji, Elona on: 06/24/2023 4:33 AM   Actions taken: Charge Capture section accepted

## 2023-06-27 ENCOUNTER — Other Ambulatory Visit: Attending: Internal Medicine

## 2023-06-27 DIAGNOSIS — D649 Anemia, unspecified: Secondary | ICD-10-CM | POA: Insufficient documentation

## 2023-06-27 LAB — STOOL IMMUNOCHEMICAL OCCULT BLOOD: Occult Blood, Immunochem: NEGATIVE

## 2023-06-27 NOTE — Telephone Encounter (Signed)
 Attempted to contact patient with no success. Left detailed VM for patient to call back and schedule Neb RT Teaching appointment.    CC: please assist when call back occurs.Thank you!

## 2023-07-01 ENCOUNTER — Ambulatory Visit (INDEPENDENT_AMBULATORY_CARE_PROVIDER_SITE_OTHER): Admitting: Internal Medicine

## 2023-07-01 VITALS — BP 120/72 | HR 77 | Temp 97.6°F | Ht 63.0 in | Wt 148.3 lb

## 2023-07-01 DIAGNOSIS — E785 Hyperlipidemia, unspecified: Secondary | ICD-10-CM

## 2023-07-01 DIAGNOSIS — I152 Hypertension secondary to endocrine disorders: Secondary | ICD-10-CM

## 2023-07-01 DIAGNOSIS — E1159 Type 2 diabetes mellitus with other circulatory complications: Secondary | ICD-10-CM

## 2023-07-01 DIAGNOSIS — D649 Anemia, unspecified: Secondary | ICD-10-CM

## 2023-07-01 DIAGNOSIS — E119 Type 2 diabetes mellitus without complications: Secondary | ICD-10-CM

## 2023-07-01 DIAGNOSIS — G4700 Insomnia, unspecified: Secondary | ICD-10-CM

## 2023-07-01 DIAGNOSIS — R5382 Chronic fatigue, unspecified: Secondary | ICD-10-CM

## 2023-07-01 DIAGNOSIS — E1169 Type 2 diabetes mellitus with other specified complication: Secondary | ICD-10-CM

## 2023-07-01 NOTE — Progress Notes (Signed)
 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 Has been with niece special needs and babysitting sister's dog for past 2 weeks as they travel. Plans to travel to see brother.    Hasn't not checking BP at home. Bp today 120/72. Feels well, no SE noted with ramapril 5mg  daily    Labs since last visit reviewed  02/24/23 CBCPD Hg 10.2 HCT 31.3, 02/14/23 Hg 11.2 HCT 33.1  06/10/23 Ferritin 53, trans sat 29, copper  nml, SPEP with IFE normal, LDH 182, haptoglobin 143  06/27/23 Stool occult negative    Chronic fatigue for years, comes and goes, overall better recently.Was napping 2-3 hours after surgery, no napping for hour, feels better after nap. Can nap anywhere. She goes to bed 9-10pm, fall asleep easily, awaken to urinate around 3am, drinks water close to bedtime. She awakens 7am, feel refreshed. No reported snoring, apnea. She feels her surgery, challenges over her lifetime, require her to rest and she is hoping over next year her body will recover and would like to reeval then.     Reviewed risks of untreated OSA, recommended sleep study, she prefers to hold for now. Sister difficulty with CPAP mask.     ROS: as above or otherwise neg     Patient Active Problem List   Diagnosis    Acquired tracheobronchomegaly with bronchiectasis  (CMS-HCC)    MAI (mycobacterium avium-intracellulare) (CMS-HCC)    Cavitary lesion of lung    Gastroesophageal reflux disease, unspecified whether esophagitis present    Hoarse voice quality    Immunodeficiency with predominantly antibody defects    Mycobacterium avium complex (CMS-HCC)    s/p Robotic right upper lobectomy with en bloc lower and middle lobe wedge resection, middle lobe wedge resection    Status post Hartmann procedure (CMS-HCC)    Ventral hernia without obstruction or gangrene    Colostomy status (CMS-HCC)    Status post cataract extraction and insertion of intraocular lens of right eye    Status post cataract extraction and insertion of intraocular lens of left eye    Type 2 diabetes mellitus without complication, without long-term current use of insulin     Pseudophakia of both eyes    Posterior vitreous detachment    S/P repair of ventral hernia    Ileostomy in place (CMS-HCC)    Unspecified mood (affective) disorder    Hyperlipidemia associated with type 2 diabetes mellitus (CMS-HCC)    Nonruptured cerebral aneurysm    Recurrent major depression in remission    Mediastinal lymphadenopathy    Osteopenia of lumbar spine    Recurrent depression    Hypertension associated with type 2 diabetes mellitus (CMS-HCC)    Chronic GERD        Current medications, allergies and problem list were  reviewed today from the EMR central listing.     Outpatient Medications Marked as Taking for the 07/01/23 encounter (Office Visit) with Sharlyn Odonnel Kaur, DO   Medication Sig Dispense Refill    albuterol  (PROVENTIL ) (2.5 MG/3ML) 0.083% nebulization 3 mL (2.5 mg) by Nebulization route every 4 hours as needed for Wheezing. 150 mL 11    atorvastatin  (LIPITOR) 20 MG tablet Take 1 tablet (20 mg) by mouth daily. 90 tablet 1    biotin  1000 MCG tablet Take 1 tablet (1,000 mcg) by mouth daily.      blood glucose meter PHARMACY: Please dispense insurance approved bluetooth enable meter. 1 each 0    Cyanocobalamin (VITAMIN B 12  PO)       ferrous sulfate  325 (65 Fe) MG tablet Take 1 tablet (325 mg) by mouth daily.      fluoxetine  (PROZAC ) 40 MG capsule Take 1 capsule (40 mg) by mouth daily. 90 capsule 3    glucose blood test strip 1 strip by Other route every morning (before breakfast). 100 strip 2    glucose blood test strip 1 strip by Other route 2 times daily (before meals). 100 strip 11    lancets 1 each by Other route every morning (before breakfast). 100 each 2    lancets 1 each by Other route 2 times daily (before meals). 100 Lancet. 5    metFORMIN  (RIOMET ) 500 MG/5ML oral solution Take 5 mL in the morning and 10 mL in the evening. 473 mL 0    Multiple Vitamin (MULTIVITAMIN) TABS tablet Take 1 tablet by mouth daily.      ramipril  (ALTACE ) 5 MG capsule Take 1 capsule (5 mg) by mouth daily. 90 capsule 1    sodium chloride  7 % NEBU 4 mL by Nebulization route every 12 hours. 240 mL 11    sodium chloride  7 % NEBU USE 1 VIAL VIA NEBULIZER  EVERY 12 HOURS 720 mL 3    umeclidinium-vilanterol (ANORO ELLIPTA ) 62.5-25 MCG/ACT inhaler Inhale 1 puff by mouth daily. 60 each 11    VENTOLIN  HFA 108 (90 Base) MCG/ACT inhaler USE 2 INHALATIONS BY MOUTH  EVERY 6 HOURS AS NEEDED FOR WHEEZING 72 g 3    vitamin D3 125 MCG (5000 UT) capsule Take 1 capsule (5,000 Units) by mouth every 24 hours.      vitamin E  400 UNIT capsule Take 1 capsule (400 Units) by mouth daily.          Allergies   Allergen Reactions    Walnuts Swelling     Tongue swollen and burn        Past Surgical History:  Past Surgical History:   Procedure Laterality Date    BRONCHOSCOPY  12/2020    BRAIN SURGERY  12, 2021    CESAREAN SECTION, LOW TRANSVERSE      COLON SURGERY  2009    COLOSTOMY      CRANIOTOMY      LUNG LOBECTOMY          Social History:  Social History     Social History Narrative    Lives with brother Lynwood at home. Sister also lives in [place]        Family History:  Family History   Problem Relation Name Age of Onset    Cancer Mother Jenkins Macario Macie alwyn and throat     Heart Disease Mother Jenkins Macario  Congrestive heart failure due to cancer    Hypertension Mother Jenkins Helling     Psychiatry Mother Jenkins Helling         Bi polar, manic depressive    Cancer Father Elsie Helling         Prostate    Diabetes Father Elsie Helling     Stroke Father Elsie Helling     Stroke M Grandmother Winifred Helling     Cancer Brother Lynwood Helling         Skin (many locations)    Diabetes Brother Lynwood Helling         Exam::  BP 120/72 (BP Location: Left arm, BP Patient Position: Sitting, BP cuff size: Regular)   Pulse 77   Temp 97.6 F (36.4 C) (Temporal)   Ht 5' 3 (1.6 m)   Wt 67.3 kg (148 lb 4.8 oz)   SpO2 98%   BMI 26.27 kg/m   body mass index is 26.27 kg/m.  Const: well developed, NAD.   HEENT:  -Eyes-normal appearance, sclerae anicteric  CV: RRR, S1, S2, no murmurs, gallops, rubs, no lower extremity edema  Resp: CTAB, good effort, no accessory mm use.   GI: Soft, nontender, nondistended, normoactive bowel sounds  MS: No cyanosis. Normal muscle bulk.   Skin: No rashes. Warm, dry.  Neuro: Grossly alert. Moving all 4 extremities.   Psych: Euthymic affect. Fair insight     ASSESSMENT AND PLAN:  Normocytic anemia (Primary)  Reviewed recent labs, next steps. She has hx of anemia that resolved with iron supplementation. She denies abd pain, constipation, blood in stool, melena, unint weight loss. Labs for secondary evaluation as above without overt finding. Recommended colonoscopy, she will schedule colonoscopy. Will continue to monitor CBCPD, if further decline will refer to hematology   - CBC w/ Diff Lavender; Future  - CBC w/ Diff Lavender; Future    Chronic fatigue  Insomnia, unspecified type  Likely multifactorial. Reviewed possible sleep apnea contributing to fatigue, nocturia, insomnia, risks of untreated sleep apnea, recommended sleep study, prefers to hold for now.     Type 2 diabetes mellitus without complication, without long-term current use of insulin  (CMS-HCC)  Hypertension associated with  type 2 diabetes mellitus (CMS-HCC)  Hyperlipidemia associated with type 2 diabetes mellitus (CMS-HCC)  Elevated BP  A1C 12/26/22 6.6, excellent control, repeat A1C in 6 months, due, ordered  Continue metformin  500 mg in AM and 1,000 mg in PM for now.    A. Retinopathy: Last eye exam 01/2022, encouraged to schedule appt for diabetic eye exam  B. Nephropathy: UMA last 12/26/22 2mg /dl, CMP 88/86/75 WNL.   C. Neuropathy: will complete at next visit  D. Lipids: Lipids LDL 40 10/14/22. Continue atorvastatin  20mg  daily  E. 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 Recently increased ramapril 2.5mg  to 5mg  daily, continue at this dose, check BMP.   - Glycosylated Hgb(A1C), Blood Lavender; Future  - Consult/Referral to Ophthalmology  - Basic Metabolic Panel, Blood Green Plasma Separator Tube; Future     Essential hypertension  BP at goal today 120/72. Recently increased ramapril 2.5mg  to 5mg  daily, continue at this dose, check BMP.      Recurrent depression (CMS-HCC)  Stable. Continue fluoxetine  40mg  daily    Not reviewed today:  Ventral hernia without obstruction or gangrene  GERD  S/p laparoscopic Nissen fundoplication for GERD and repair of hiatal hernia 02/10/23   She  underwent HH repair and fundoplication in Feb 2025 with improved GERD symptoms. Continue omeprazole  20mg  prn     Right-sided middle cerebral artery branch aneurysm s/p embolization on 06/2019  MRA 06/06/2021 shows no residual, she had successful treatment of the aneurysm and no other lesion     Acute deep vein thrombosis (DVT) of proximal vein of right lower extremity (CMS-HCC) (Primary)  Recent history of acute provoked distal vein DVT post surgery. Has since started Eliquis  with resolved right LE swelling and pain. Plan for 3 months of Eliquis  and then to stop.      Mycobacterium avium complex (CMS-HCC)  s/p Robotic right upper lobectomy with en bloc lower  and middle lobe wedge resection, middle lobe wedge resection  S/P completion of antibiotic treatment 06/08/2022, S/P DA VINCI XI-Right Robotic upper lobectomy with middle and lower  lobe wedge resection on 03/20/2021.  EGD with [facility] 02/01/2021 showed significant reflux. She is now s/p surgical takedown of a colostomy followed by laparoscopic Nissen fundoplication for GERD and repair of hiatal hernia on 02/10/23. Continue airway clearance with nebulized hypertonic saline twice daily and regular exercise as she is doing. 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 Follows with pulmonary     I personally spent a total of 30 minutes in face-to-face and non-face-to-face activities related to the patient's visit today, excluding any separately reportable services/procedures.    RV 3 months for followup

## 2023-07-06 ENCOUNTER — Telehealth (INDEPENDENT_AMBULATORY_CARE_PROVIDER_SITE_OTHER): Payer: Self-pay

## 2023-07-06 DIAGNOSIS — E119 Type 2 diabetes mellitus without complications: Secondary | ICD-10-CM

## 2023-07-06 DIAGNOSIS — Z0000 Encounter for general adult medical examination without abnormal findings: Secondary | ICD-10-CM

## 2023-07-07 NOTE — Telephone Encounter (Addendum)
 Outbound call to patient: Confirmed that Tiffany Chen was previously taking Metformin  Solution due to her surgery.  Patient is now able to swallow tablets.  Her current Metformin  dose is 500 mg in the morning and 1000 mg in the evening.        Tiffany Chen, CPhT  ([facility])      Biguanide Refill Protocol    Last visit in enc specialty: 07/01/2023     Recent Visits in This Encounter Department       Date Provider Department Visit Type Primary Dx    07/01/2023 Rosalene Hedda Aurora, DO [facility] Internal Medicine Office Visit Normocytic anemia    05/27/2023 Rosalene Hedda Campo, DO [facility] Internal Medicine Office Visit Acute deep vein thrombosis (DVT) of proximal vein of right lower extremity (CMS-HCC)    01/23/2023 Rosalene Hedda Aurora, DO [facility] Internal Medicine Office Visit RUQ abdominal pain    12/26/2022 Rosalene Hedda Aurora, DO [facility] Internal Medicine Office Visit Type 2 diabetes mellitus without complication, without long-term current use of insulin  (CMS-HCC)    07/09/2022 Arnoldo Ileana Bartley Amil, MD [facility] Internal Medicine Office Visit Type 2 diabetes mellitus without complication, without long-term current use of insulin            Population Health Visits  Recent [facility] Visits    None       Next f/u appt due:  Return for 3 month and 6 month followup for diabetes, schedule AWV as separate appt ok for VV   Next appt in enc specialty: 09/11/2023      Future Appointments 07/07/2023 - 07/05/2028        Date Visit Type Department Provider     07/21/2023  8:00 AM LVL 3 - 3 HR [place] ONCOLOGY CHEMO [facility] INFUSION, 9TH FLOOR    Appointment Notes:     IVIG 25g             07/30/2023  3:00 PM RETURN IMMUNODEFICIENCY [facility] Luke Marsa Lie, MD    Appointment Notes:     6 month follow-up 45 min             09/11/2023 11:00 AM RETURN PRIMARY EXTEND [facility] Internal Medicine Rosalene Hedda Aurora, DO    Appointment Notes:     f/u labs             12/09/2023  9:15 AM MEDICARE ANNUAL WELLNESS VISIT [facility] Internal Medicine Rosalene Hedda Aurora, DO    Appointment Notes:     MAW                    Per OV note on 07/01/2023:  HPI: History significant for bronchiectasis, cavitary MAC lung infection s.p s/p RUL lobectomy and RML wedge resection 03/20/21, hiatal hernia with plan for repair and fundoplication 02/2023, 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, T2DM, history of perforated diverticulitis s/p partial colectomy and colostomy in 2009 followed by 03/2022 hernia surgery with colostomy takedown, colorectal anastomosis, and creation of an ileostomy plan for takedown , 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    A. Retinopathy: Last eye exam 01/2022, encouraged to schedule appt for diabetic eye exam  B. Nephropathy: UMA last 12/26/22 2mg /dl, CMP 88/86/75 WNL.   C. Neuropathy: will complete at next visit  D. Lipids: Lipids LDL 40 10/14/22. Continue atorvastatin  20mg  daily  E. 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 Recently increased ramapril 2.5mg  to 5mg  daily, continue at this dose, check BMP.   - Glycosylated Hgb(A1C), Blood Lavender; Future  - Consult/Referral to Ophthalmology  - Basic Metabolic Panel, Blood Green Plasma Separator Tube; Future      LABS required:  (Q 62mo HgbA1C) (Q year Creatinine, Lipid  Panel(pop), ALT, Microalbumin)    Lab Results   Component Value Date    A1C 6.6 (H) 12/26/2022    A1C 6.2 (H) 08/05/2022    A1C 6.4 (H) 07/03/2022     Per Notes on Labs 12/26/2022      No results found for: A1CP    Lab Results   Component Value Date    NA 137 06/10/2023    K 4.4 06/10/2023    CL 101 06/10/2023    BICARB 26 06/10/2023    BUN 21 06/10/2023    CREAT 0.86 06/10/2023       Lab Results   Component Value Date    GFRNON >60 11/06/2020    EGFRCKDEPI >60 06/10/2023       Lab Results   Component Value Date    CHOL 115 10/14/2022    TRIG 114 10/14/2022    HDL 55 10/14/2022    NHDLV 60 10/14/2022    LDLCALC 40 10/14/2022        Lab Results   Component Value Date    ALT 29 06/10/2023       No results found for: [facility]    Lab Results   Component Value Date    MICALB <1.2 12/26/2022       Lab Results   Component Value Date    MACRR Unable to calculate 12/26/2022       No results found for: MICROALBUMIN, MCRALCREAT    Lab Results   Component Value Date    CREATUR 146 12/26/2022         Monitoring required:  (Q year BP)    (BP range: Systolic=90-150  Ipjdunopr=49-09)  Blood Pressure   07/01/23 120/72   06/23/23 139/73   06/13/23 (!) 151/57         Last Digital Health Monitoring Vitals:         Last Digital Health Monitoring BG:        Last MyChart BP Values:        Last Pt Entered MyChart BP Values:               If information is available and meets protocol criteria: May refill up to SIX MONTHS

## 2023-07-09 NOTE — Telephone Encounter (Signed)
 Hi Dr. Rosalene, Tejpreet Kaur,    Received refill request for metformin . Noted pt requested change from liquid to tablets, so I updated rx. Please let me know if you disagree.      Of note, I authorized 90 + 0 RF. Please let me know if you disagree    Thanks,    Edsel Cheadle, PharmD, BCPS  [facility]   Refill and Prior Auth Clinical Services  Phone:  [phone]  Ext:  [phone]

## 2023-07-20 NOTE — Telephone Encounter (Signed)
 Ok to send tablet form as was sent. Thank you

## 2023-07-21 ENCOUNTER — Ambulatory Visit
Admission: RE | Admit: 2023-07-21 | Discharge: 2023-07-21 | Disposition: A | Discharge status: Home / Self Care | Attending: Allergy | Admitting: Allergy

## 2023-07-21 VITALS — BP 140/70 | HR 68 | Temp 97.6°F | Resp 18 | Ht 63.0 in | Wt 151.5 lb

## 2023-07-21 DIAGNOSIS — D809 Immunodeficiency with predominantly antibody defects, unspecified: Secondary | ICD-10-CM | POA: Insufficient documentation

## 2023-07-21 LAB — IGG, BLOOD: IGG: 940 mg/dL (ref 700–1600)

## 2023-07-21 MED ORDER — DIPHENHYDRAMINE HCL 25 MG OR TABS OR CAPS CUSTOM
25.0000 mg | ORAL_CAPSULE | Freq: Once | ORAL | Status: DC | PRN
Start: 2023-07-21 — End: 2023-07-21

## 2023-07-21 MED ORDER — CETIRIZINE HCL 10 MG OR TABS
10.0000 mg | ORAL_TABLET | Freq: Once | ORAL | Status: AC
Start: 2023-07-21 — End: 2023-07-21
  Administered 2023-07-21: 10 mg via ORAL
  Filled 2023-07-21: qty 1

## 2023-07-21 MED ORDER — DEXTROSE 5 % IV SOLN
INTRAVENOUS | Status: DC
Start: 2023-07-21 — End: 2023-07-21

## 2023-07-21 MED ORDER — ACETAMINOPHEN 325 MG PO TABS
650.0000 mg | ORAL_TABLET | Freq: Once | ORAL | Status: AC
Start: 2023-07-21 — End: 2023-07-21
  Administered 2023-07-21: 650 mg via ORAL
  Filled 2023-07-21: qty 2

## 2023-07-21 MED ORDER — IMMUNE GLOBULIN (HUMAN) 20 GM/200ML IJ SOLN
0.5500 g/kg | Freq: Once | INTRAMUSCULAR | Status: AC
Start: 2023-07-21 — End: 2023-07-21
  Administered 2023-07-21: 30 g via INTRAVENOUS
  Filled 2023-07-21: qty 200

## 2023-07-21 NOTE — Addendum Note (Signed)
 Encounter addended by: Flynn Keturah HERO, RN on: 07/21/2023 3:26 PM   Actions taken: Raul Discharge Instructions

## 2023-07-21 NOTE — Interdisciplinary (Signed)
 Non-Chemotherapy Infusion Nursing Note - [facility]    Tiffany Chen is a 78 year old female who presents for infusion of IVIG 30 g.     Patient Active Problem List   Diagnosis    Acquired tracheobronchomegaly with bronchiectasis (CMS-HCC)    MAI (mycobacterium avium-intracellulare) (CMS-HCC)    Cavitary lesion of lung    Gastroesophageal reflux disease, unspecified whether esophagitis present    Hoarse voice quality    Immunodeficiency with predominantly antibody defects    Mycobacterium avium complex (CMS-HCC)    s/p Robotic right upper lobectomy with en bloc lower and middle lobe wedge resection, middle lobe wedge resection    Status post Hartmann procedure (CMS-HCC)    Ventral hernia without obstruction or gangrene    Colostomy status (CMS-HCC)    Status post cataract extraction and insertion of intraocular lens of right eye    Status post cataract extraction and insertion of intraocular lens of left eye    Type 2 diabetes mellitus without complication, without long-term current use of insulin     Pseudophakia of both eyes    Posterior vitreous detachment    S/P repair of ventral hernia    Ileostomy in place (CMS-HCC)    Unspecified mood (affective) disorder    Hyperlipidemia associated with type 2 diabetes mellitus (CMS-HCC)    Nonruptured cerebral aneurysm    Recurrent major depression in remission    Mediastinal lymphadenopathy    Osteopenia of lumbar spine    Recurrent depression    Hypertension associated with type 2 diabetes mellitus (CMS-HCC)    Chronic GERD       Vitals:    07/21/23 0813 07/21/23 0920 07/21/23 0953 07/21/23 1108   BP: 144/68 135/80 137/75 140/70   BP Location: Left arm Left arm Left arm Left arm   BP Patient Position: Sitting Semi-Fowlers Semi-Fowlers Sitting   Pulse: 75 70 66 68   Resp: 16 18 18 18    Temp: 97.3 F (36.3 C) 97.6 F (36.4 C) 98 F (36.7 C) 97.6 F (36.4 C)   TempSrc: Temporal Temporal Temporal Temporal   SpO2: 100% 99% 99% 98%   Weight: 68.7 kg (151 lb  7.3 oz)      Height: 5' 3 (1.6 m)        Pain Score: 0  Body surface area is 1.75 meters squared.  Body mass index is 26.83 kg/m.    Pre-treatment nursing assessment:  No problems identified upon assessment.  infusion of IVIG 30 g. PIV inserted in L hand, labs drawn and sent prior to infusion. Upon admission pt reports no pain, nausea, vomiting or diarrhea and VSS. Pt pre-medicated with Tylenol  650 mg and Zyrtec  10 mg. Pt's vitals remained stable throughout infusion (see below) and pt was discharged to home with vitals stable and in no apparent distress.      [name] tolerated treatment well.  Observation: Not ordered    Post blood return: Brisk  Post-Flush: NS and Discontinued IV    Patient Education  Learner: Patient  Barriers to learning: No Barriers  Readiness to learn: Acceptance  Method: Explanation    Treatment Education: Information/teaching given to patient including: signs and symptoms of infection, bleeding, adverse reaction(s), symptom control, and when to notify MD.    [place] Prevention Education: Instructed patient to call for assistance.    Pain Education: Patient instructed to contact nurse if pain should develop or if their current pain therapy becomes ineffective.    Response: [employer]  understanding    Discharge Plan  Discharge instructions given to patient.  Future appointments given and reviewed with treatment plan.  Discharge Mode: Ambulatory  Discharge Time: 1109  Accompanied by: Self  Discharged To: Home     Medications   dextrose  5% infusion (0 mL IntraVENOUS Stopped 07/21/23 1100)   diphenhydrAMINE  (BENADRYL ) tablet or capsule 25 mg (25 mg Oral Not given 07/21/23 0832)   acetaminophen  (TYLENOL ) tablet 650 mg (650 mg Oral Given 07/21/23 0817)   cetirizine  (ZYRTEC ) tablet 10 mg (10 mg Oral Given 07/21/23 0817)   immune globulin  (human) (IgG) 30 g in 300 mL (GAMUNEX-C ) infusion (0 g IntraVENOUS Completed 07/21/23 1100)

## 2023-07-21 NOTE — Addendum Note (Signed)
 Encounter addended by: Flynn Keturah HERO, RN on: 07/21/2023 4:54 PM   Actions taken: Marked Discharge Instructions as Reviewed

## 2023-07-21 NOTE — Addendum Note (Signed)
 Encounter addended by: Flynn Keturah HERO, RN on: 07/21/2023 3:29 PM   Actions taken: Flowsheet accepted

## 2023-07-21 NOTE — Discharge Instructions (Signed)
 For any questions or concerns, you can call your Hematology/Oncology doctor's office, Monday - Friday, during business hours.   Please keep the clinic phone number in an easy to find place.     If you are unable to reach your nurse AND you have urgent symptoms, call [facility] at [phone] from 8 a.m. to 9 p.m, Monday through Friday.     For medical emergencies, please call 911    Symptoms that may require emergency care:    Allergic reaction: itching or hives, swelling in your face and hands, swelling or tingling in your mouth or throat, chest tightness, trouble breathing  Temperature greater than or equal to 100.5 degrees Farenheit  Sudden onset of chest pain or shortness of breath, worsening shortness of breath  Abdominal pain with nausea, vomiting, or fever  Numbness or weakness in your face or limbs  Lightheadedness, dizziness, or fainting  Unusual bleeding, bruising, or weakness    For urgent concerns after hours, weekdays, and holidays, you may call the on-call Hematology/Oncology doctor:    [facility]  [place] and [place]:  [phone]    [place], [place], and [place] [facility]  [facility]  [facility] [address]  [phone]    Patients are advised to contact the infusion scheduling line at [phone] to coordinate appointments and ensure timely access to treatment.   For [place], [employer], and [employer], please press 2  For [facility] locations: [place], [place], and [place], please press 3

## 2023-07-23 NOTE — Addendum Note (Signed)
 Encounter addended by: Tressa Patty on: 07/23/2023 9:28 AM   Actions taken: Flowsheet accepted, Charge Capture section accepted

## 2023-07-30 ENCOUNTER — Ambulatory Visit (INDEPENDENT_AMBULATORY_CARE_PROVIDER_SITE_OTHER): Payer: Medicare Other | Admitting: Allergy

## 2023-07-30 ENCOUNTER — Encounter (INDEPENDENT_AMBULATORY_CARE_PROVIDER_SITE_OTHER): Payer: Self-pay | Admitting: Internal Medicine

## 2023-07-30 ENCOUNTER — Encounter (INDEPENDENT_AMBULATORY_CARE_PROVIDER_SITE_OTHER): Payer: Self-pay | Admitting: Allergy

## 2023-07-30 VITALS — BP 113/70 | HR 79 | Temp 97.1°F | Ht 63.0 in | Wt 145.0 lb

## 2023-07-30 DIAGNOSIS — D809 Immunodeficiency with predominantly antibody defects, unspecified: Secondary | ICD-10-CM

## 2023-07-30 DIAGNOSIS — J479 Bronchiectasis, uncomplicated: Secondary | ICD-10-CM

## 2023-07-30 NOTE — Patient Instructions (Signed)
 After visit summary:    Continue IVIG at [facility] monthly     Follow-up in 6 months

## 2023-07-30 NOTE — Progress Notes (Signed)
 Allergy/Immunology Follow-up    Date: 07/30/2023  ______________________________________________________________________    HPI: Tiffany Chen is a 78 year old female here for follow-up for immunodeficiency. Patient was last seen on 01/29/2023 at which time recommendations were to continue at dosing Gamunex-C  at 30 g every 4 weeks. Last IgG 940. No significant infections since the last visit and tolerating infusions well.     Follow-up 01/29/2023: Patient was last seen on 07/31/2022 at which time recommendations were for continuation of IgG replacement therapy Gamunex-C  at 25g every 4 weeks. IgG 888 10/2022. Did have a cold for 3 weeks requiring ED visit (coughing, vomiting) in November. Discussed given weight changes, and drop in IgG to increase dosing to 30g every 4 weeks.     Follow-up 07/31/2022: Patient was last seen on 01/30/2022 at which time recommendations were continuation of IgG replacement therapy with Gamunex-C  at 25g every 4 weeks, which is tolerated.     Follow-up 01/30/22: Patient was last seen on 08/01/2021 at which time recommendations were for continuation of IgG replacement therapy ( Gamunex-C  at 25g every 4 weeks ) with IgG check. Last IgG 936 in November 2023. AST slightly elevated. Pending hernia repair/GI surgery. She has noted a significant positive impact on infusions. Tolerating infusions well.     Follow-up 08/01/21: Patient was last seen on 03/19/2021 at which time recommendations were for continuation of IgG replacement therapy Gamunex-C  at 25g every 4 weeks. IgG 1030 1 month ago. CBC without cytopenia.    Follow-up 03/19/21: Patient was last seen on 12/18/2020 at which time recommendations were to proceed with IVIG at [facility] starting at 500mg /kg. Patient has tolerated IVIG well, and is now s/p 3 infusions. She will be undergoing lung lobectomy on 3/14.     Follow-up 12/18/20: Patient was last seen on 09/07/2020 at which time recommendations were for additional immune  work-up including repeat quantitative immunoglobulins as well as vaccine titers (tetanus and pneumococcal). Pneumovax-23 administered 09/22/20. Only 14/23 protective at recheck. Discussion on IVIG replacement therapy today, and patient has informed Tuesday or Wed in 2 weeks at [facility] would be ideal.     Initial consultation: Prior medical records have been reviewed carefully prior to this consultation and salient details have been summarized below. Patient has a history of bronchiectasis, cavitary lung infection (MAC). There is no history of chronic sinusitis. She has had multiple rounds of treatment for MAC, with most recent antibiotic regimen including amikacin , azithromycin , ethambutol  and rifampin .     Environmental and social history:  Tobacco smoke exposure:   Social History     Tobacco Use   Smoking Status Never   Smokeless Tobacco Never         Past Medical and Surgical History:  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)  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     Past  Surgical History:   Procedure Laterality Date    BRONCHOSCOPY  12/2020    BRAIN SURGERY  12, 2021    CESAREAN SECTION, LOW TRANSVERSE      COLON SURGERY  2009    COLOSTOMY      CRANIOTOMY      LUNG LOBECTOMY         Allergies:  Allergies   Allergen Reactions    Walnuts Swelling     Tongue swollen and burn       Medications:    Current Outpatient Medications:     albuterol  (PROVENTIL ) (2.5 MG/3ML) 0.083% nebulization, 3 mL (2.5 mg) by Nebulization route every 4 hours as needed for Wheezing., Disp: 150 mL, Rfl: 11    atorvastatin  (LIPITOR) 20 MG tablet, Take 1 tablet (20 mg) by mouth daily., Disp: 90 tablet, Rfl: 1    biotin  1000 MCG tablet, Take 1 tablet (1,000 mcg) by mouth daily., Disp: , Rfl:     blood glucose meter, PHARMACY: Please dispense insurance approved bluetooth enable meter., Disp: 1 each, Rfl: 0    Cyanocobalamin (VITAMIN B 12 PO), , Disp: , Rfl:     ferrous sulfate  325 (65 Fe) MG tablet, Take 1 tablet (325 mg) by mouth daily., Disp: , Rfl:     fluoxetine  (PROZAC ) 40 MG capsule, Take 1 capsule (40 mg) by mouth daily., Disp: 90 capsule, Rfl: 3    gabapentin  (NEURONTIN ) 250 MG/5ML solution, Take 6 mL (300 mg) by mouth every 8 hours., Disp: 300 mL, Rfl: 0    glucose blood test strip, 1 strip by Other route every morning (before breakfast)., Disp: 100 strip, Rfl: 2    glucose blood test strip, 1 strip by Other route 2 times daily (before meals)., Disp: 100 strip, Rfl: 11    lancets, 1 each by Other route every morning (before breakfast)., Disp: 100 each, Rfl: 2    lancets, 1 each by Other route 2 times daily (before meals)., Disp: 100 Lancet., Rfl: 5    metFORMIN  (GLUCOPHAGE ) 500 mg tablet, Take 1 tablet (500 mg) by mouth every morning AND 2 tablets (1,000 mg) every evening., Disp: 270 tablet, Rfl: 0    Multiple Vitamin (MULTIVITAMIN) TABS tablet, Take 1 tablet by mouth daily., Disp: , Rfl:     ramipril  (ALTACE ) 5 MG capsule, Take 1 capsule (5 mg) by mouth daily., Disp: 90 capsule, Rfl: 1    sodium chloride   7 % NEBU, 4 mL by Nebulization route every 12 hours., Disp: 240 mL, Rfl: 11    sodium chloride  7 % NEBU, USE 1 VIAL VIA NEBULIZER  EVERY 12 HOURS, Disp: 720 mL, Rfl: 3    umeclidinium-vilanterol (ANORO ELLIPTA ) 62.5-25 MCG/ACT inhaler, Inhale 1 puff by mouth daily., Disp: 60 each, Rfl: 11    VENTOLIN  HFA 108 (90 Base) MCG/ACT inhaler, USE 2 INHALATIONS BY MOUTH  EVERY 6 HOURS AS NEEDED FOR WHEEZING, Disp: 72 g, Rfl: 3    vitamin D3 125 MCG (5000 UT) capsule, Take 1 capsule (5,000 Units) by mouth every 24 hours., Disp: , Rfl:     vitamin E  400 UNIT capsule, Take 1 capsule (400 Units) by mouth daily., Disp: , Rfl:     FH: No FH of  atopic disease: asthma, allergic rhinitis, eczema, food allergies     07/30/23  1502   BP: 113/70   Pulse: 79   Temp: 97.1 F (36.2 C)   SpO2: 97%     Resp: no accessory muscle usage, no respiratory distress, CTAB    Diagnostic studies:                  IMPRESSION:  1. Post interval right upper lobectomy with a small residual right hydropneumothorax.      2. Scattered tree-in-bud airspace opacities throughout the lungs, similar to slightly improved.  Previously seen patchy groundglass opacities within the lungs appear improved.    3. Mediastinal lymphadenopathy.  This appears slightly increased in comparison to the prior study.          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 Bronchiectasis   3. 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 Bronchiectasis   3. MAC infection  Patient presents for follow-up for immunodeficiency with antibody defects. Given her history of persistent MAC infection, bronchiectasis, low IgM, low IgG subclass, and poor vaccine titers, advise continuation of IgG replacement therapy. IgG replacement  infusion therapy is well tolerated, is medically indicated given the above clinical profile and concerns for evolution to CVID given low IgM and low IgG subclass 2/3. IgG has been lower than ideal [phone] range with last IgG trough at 888 in October 2024. Will increase to 30g every 4 weeks. Continues bronchiectasis / MAC management with Dr. Bambi.      We have discussed both intravenous and subcutaneous immunoglobulin replacement therapy with the patient and she would like to continue with IVIG at [facility] centers - Tuesdays/Wednesdays best days. Risks of immunoglobulin replacement therapy were carefully reviewed with the patient as there are several black box warnings on the products. Although the risks general occur more in high dose immunoglobulin replacement regimens such as in neurological disorders, we have seen severe adverse effects at PID dosing and advise all patients that these risks are real, severe and capable of presenting at any dosing scheme. We have reviewed anaphylaxis, shock, death, pulmonary embolism, DVT, hemolytic disorders, acute renal failure (we have seen acute renal dysfunction but not failure in non-sucrose formulations), aseptic meningitis, TRALI/pulmonary edema, swelling, weight gain and local infusion site reactions/pain.   -Will continue at dosing Gamunex-C  at 30 g every 4 weeks  -Recommendations are for immunoglobulin replacement therapy  -Risks, alternatives, benefits discussed as above  -Risk of severe progression of lung disease and life-threatening infections if immunoglobulin replacement is not approved and has been clearly documented above in terms of reasons to support prescription; failure to approve could lead to life-threatening pulmonary consequences   -IgG check prior to infusions    RTC 6 months     The plan of care was discussed with the patient who expressed agreement and understanding. Questions were answered prior to conclusion of the visit.      Marsa CANDIE Cramp, MD  Clinical Associate Professor, [facility]

## 2023-07-31 ENCOUNTER — Encounter (HOSPITAL_BASED_OUTPATIENT_CLINIC_OR_DEPARTMENT_OTHER): Payer: Self-pay | Admitting: Critical Care Medicine

## 2023-08-03 ENCOUNTER — Encounter (INDEPENDENT_AMBULATORY_CARE_PROVIDER_SITE_OTHER): Payer: Self-pay | Admitting: Allergy

## 2023-08-04 ENCOUNTER — Encounter (INDEPENDENT_AMBULATORY_CARE_PROVIDER_SITE_OTHER): Payer: Self-pay | Admitting: Critical Care Medicine

## 2023-08-04 ENCOUNTER — Encounter (HOSPITAL_BASED_OUTPATIENT_CLINIC_OR_DEPARTMENT_OTHER): Payer: Self-pay | Admitting: Critical Care Medicine

## 2023-08-04 DIAGNOSIS — J471 Bronchiectasis with (acute) exacerbation: Secondary | ICD-10-CM

## 2023-08-06 ENCOUNTER — Inpatient Hospital Stay (INDEPENDENT_AMBULATORY_CARE_PROVIDER_SITE_OTHER): Admit: 2023-08-06 | Discharge: 2023-08-06

## 2023-08-06 DIAGNOSIS — J471 Bronchiectasis with (acute) exacerbation: Secondary | ICD-10-CM

## 2023-08-06 NOTE — Interdisciplinary (Signed)
 Respiratory Education Services Care Note:    Tiffany Chen is a 78 year old female seen today for a new patient  pulmonary education therapy visit. The patient was referred by Dr. Elmaraachli  due to a history of Bronchiectasis and Mycobacterium Avium Complex (MAC) assessed at their last clinic visit on 06/18/2023. Assessment and instruction completed by Phylliss Daniels, BSRT, RRT.    Spirometry     08/17/2021  Complete PFT             History     Assessment & History:    Smoking History:   []  Former, Pack-Year:               []  Current [x]  Never  History of: []  COPD []  Asthma []  OSA []  Polio []  None    Hospitalization or ER or Exacerbation visit since last visit:   [x]  No []  Yes:    Significant changes of symptoms:    [x]  No []  Yes:     Difficulty Swallowing or Choking:   []  No  []  Yes:  Rarely since GERD and lung disection removal           Other Respiratory Devices     Current DME:  [facility]    Maybe [employer]?    Airway Clearance Devices:  Type: Acapella  Regimen: Daily and PRN      Other Devices:    [employer], regular/sidestream/aeroeclipse nebulizer cup and tubing       Device Settings     Prescription as follows:         Date:     PEP / OPEP Original    Machine   (make and model) Acapella    Device Usage Daily and PRN   Resistance Setting 3-5    Length of therapy 10   Cycle   1-5   Huff Cough YES        Respiratory Medications     Short Acting Medications (Rescue)  Albuterol  Inhaled Solution - SVN Q4PRN    Long Acting Medications  Umeclidinium/Vilanterol (Anoro Ellipta ) - DPI QD    Mucoactive Agent Medication  Hypertonic Saline (7% Sodium Chloride )  - SVN Q12HR         Visit Free Text Summary     Accompanied by: Self       Patient brought all of her nebulizer equipment and Albuterol  solution to today's visit. Patient was not aware that she also has the 7% Hypertonic Saline nebulizer solution that was ordered and needs to be picked up. She reported that she has not yet used  any of her nebulizer medications, as she only recently received the nebulizer machine. Patient does have an Acapella device at home, but hasn't been using it as often as she should.    Detailed instructions were provided on how to use the nebulizer. Huff coughing was explained and demonstrated to promote effective airway clearance. The patient was encouraged to use the Acapella and perform huff coughing more frequently throughout the day as needed.    The patient successfully completed an Albuterol  nebulizer treatment in the clinic today without any adverse reactions or distress before, during, or after the treatments.    Instructions on how to clean, disinfect, and store the nebulizer devices and equipment were reviewed in detail. Patient was given another nebulizer set up to be used as backup. The patient was encouraged to use a backup nebulizer set alternately with her current one to prolong  the life of the equipment.     Per patient request, written instructions were given to enhance today's education.    Patient was encouraged to contact the clinic if she notices any significant respiratory deterioration before the next scheduled clinic visit.     Dr. Bambi has requested patient education regarding respiratory equipment and patient education regarding pulmonary therapies    Based on this, we discussed the purpose and benefits of a secretion clearance therapy, medication administration, an airway clearance regimen, mouthpiece ventilation, and tandem therapies       Education Provided      (Bronchiectasis/CF Regimen) Respiratory reviewed the proper medication administration and device usage. Their education included breathing techniques for medication administration and during device therapy. I discussed the purpose and benefits of the therapy regimen and instructed the patient on how to perform a therapy session in the proper sequence. The patient was able to verbalize medication  type, dosage, frequency,  and storage. There were no complications or adverse reaction noted post therapy session. The patient and caregiver, Self, demonstrated proper reverse technique during the therapy trial and identified disinfecting techniques provided.     I spent 60 minutes in-person instructing and demonstrating on the utilization, purpose, & benefits of their devices. The patient expressed an understanding of today's teaching and was able to demonstrate reverse teach-back.          Recommendations     After physician assessment, it has been determined:     Please follow Doctor's regimen plan.

## 2023-08-12 ENCOUNTER — Encounter (INDEPENDENT_AMBULATORY_CARE_PROVIDER_SITE_OTHER): Payer: Self-pay | Admitting: Allergy

## 2023-08-27 ENCOUNTER — Encounter (HOSPITAL_BASED_OUTPATIENT_CLINIC_OR_DEPARTMENT_OTHER): Payer: Self-pay | Admitting: Allergy

## 2023-08-27 ENCOUNTER — Telehealth (HOSPITAL_BASED_OUTPATIENT_CLINIC_OR_DEPARTMENT_OTHER): Payer: Self-pay

## 2023-08-27 ENCOUNTER — Ambulatory Visit
Admission: RE | Admit: 2023-08-27 | Discharge: 2023-08-27 | Disposition: A | Discharge status: Home / Self Care | Attending: Allergy | Admitting: Allergy

## 2023-08-27 VITALS — BP 127/82 | HR 79 | Temp 97.6°F | Resp 18 | Ht 62.21 in | Wt 151.2 lb

## 2023-08-27 DIAGNOSIS — D809 Immunodeficiency with predominantly antibody defects, unspecified: Secondary | ICD-10-CM | POA: Insufficient documentation

## 2023-08-27 MED ORDER — CETIRIZINE HCL 10 MG OR TABS
10.0000 mg | ORAL_TABLET | Freq: Once | ORAL | Status: AC
Start: 2023-08-27 — End: 2023-08-27
  Administered 2023-08-27: 10 mg via ORAL
  Filled 2023-08-27: qty 1

## 2023-08-27 MED ORDER — ACETAMINOPHEN 325 MG PO TABS
650.0000 mg | ORAL_TABLET | Freq: Once | ORAL | Status: AC
Start: 2023-08-27 — End: 2023-08-27
  Administered 2023-08-27: 650 mg via ORAL
  Filled 2023-08-27: qty 2

## 2023-08-27 MED ORDER — DEXTROSE 5 % IV SOLN
INTRAVENOUS | Status: DC
Start: 2023-08-27 — End: 2023-08-27

## 2023-08-27 MED ORDER — IMMUNE GLOBULIN (HUMAN) 20 GM/200ML IJ SOLN
0.5500 g/kg | Freq: Once | INTRAMUSCULAR | Status: AC
Start: 2023-08-27 — End: 2023-08-27
  Administered 2023-08-27: 30 g via INTRAVENOUS
  Filled 2023-08-27: qty 100

## 2023-08-27 MED ORDER — DIPHENHYDRAMINE HCL 25 MG OR TABS OR CAPS CUSTOM
25.0000 mg | ORAL_CAPSULE | Freq: Once | ORAL | Status: DC | PRN
Start: 2023-08-27 — End: 2023-08-27

## 2023-08-27 NOTE — Discharge Instructions (Signed)
 For any questions or concerns, you can call your Hematology/Oncology doctor's office, Monday - Friday, during business hours.   Please keep the clinic phone number in an easy to find place.     If you are unable to reach your nurse AND you have urgent symptoms, call [facility] at [phone] from 8 a.m. to 9 p.m, Monday through Friday.     For medical emergencies, please call 911    Symptoms that may require emergency care:    Allergic reaction: itching or hives, swelling in your face and hands, swelling or tingling in your mouth or throat, chest tightness, trouble breathing  Temperature greater than or equal to 100.5 degrees Fahrenheit  Sudden onset of chest pain or shortness of breath, worsening shortness of breath  Abdominal pain with nausea, vomiting, or fever  Numbness or weakness in your face or limbs  Lightheadedness, dizziness, or fainting  Unusual bleeding, bruising, or weakness    For urgent concerns after hours, weekdays, and holidays, you may call the on-call Hematology/Oncology doctor:    [facility]  [place] and [place]:  [phone]    [place], [place], and [place] [facility]  [facility]  [facility] at [address]  [phone]    Patients are advised to contact the infusion scheduling line at [phone] to coordinate appointments and ensure timely access to treatment.   For [place], [employer], and [employer], please press 2  For [facility] locations: [place], [place], and [place], please press 3

## 2023-08-27 NOTE — Telephone Encounter (Signed)
 LVM w/ appt details 9/17, 10/15, 11/12 @ [facility].

## 2023-08-27 NOTE — Interdisciplinary (Signed)
 Non-Chemotherapy Infusion Nursing Note - [facility]    Tiffany Chen is a 78 year old female who presents for infusion of IVIG 25 G    Patient Active Problem List   Diagnosis    Acquired tracheobronchomegaly with bronchiectasis (CMS-HCC)    MAI (mycobacterium avium-intracellulare) (CMS-HCC)    Cavitary lesion of lung    Gastroesophageal reflux disease, unspecified whether esophagitis present    Hoarse voice quality    Immunodeficiency with predominantly antibody defects    Mycobacterium avium complex (CMS-HCC)    s/p Robotic right upper lobectomy with en bloc lower and middle lobe wedge resection, middle lobe wedge resection    Status post Hartmann procedure (CMS-HCC)    Ventral hernia without obstruction or gangrene    Colostomy status (CMS-HCC)    Status post cataract extraction and insertion of intraocular lens of right eye    Status post cataract extraction and insertion of intraocular lens of left eye    Type 2 diabetes mellitus without complication, without long-term current use of insulin     Pseudophakia of both eyes    Posterior vitreous detachment    S/P repair of ventral hernia    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4 C) 98 F (36.7 C) 97.6 F (36.4 C)   TempSrc:    Temporal   SpO2: 97%  98% 98%   Weight:       Height:         Pain Score: 0  Body surface area is 1.74 meters squared.  Body mass index is  27.48 kg/m.    Pre-treatment nursing assessment:  No problems identified upon assessment.  Pt arrived A&Ox4 with no c/o pain, nausea, vomiting, cough, sob, fevers, constipation or diarrhea. Pt premedicated as per order. PIV inserted in L Hand on first attempt. IVIG administered successfully. Pt premedicated as per order.     Tiffany Chen tolerated treatment well.  Observation: Not ordered    Post blood return: Brisk  Post-Flush: NS and Discontinued IV    Patient Education  Learner: Patient  Barriers to learning: No Barriers  Readiness to learn: Acceptance  Method: Explanation    Treatment Education: Information/teaching given to patient including: signs and symptoms of infection, bleeding, adverse reaction(s), symptom control, and when to notify MD.    [place] Prevention Education: Instructed patient to call for assistance.    Pain Education: Patient instructed to contact nurse if pain should develop or if their current pain therapy becomes ineffective.    Response: Verbalizes understanding    Discharge Plan  Discharge instructions given to patient.  Future appointments given and reviewed with treatment plan.  Discharge Mode: Ambulatory  Discharge Time: 1122  Accompanied by: Self  Discharged To: Home  Medications   dextrose  5% infusion (0 mL IntraVENOUS Stopped 08/27/23 1120)   diphenhydrAMINE  (BENADRYL ) tablet or capsule 25 mg (25 mg Oral Not given 08/27/23 0800)   acetaminophen  (TYLENOL ) tablet 650 mg (650 mg Oral Given 08/27/23 0825)   cetirizine  (ZYRTEC ) tablet 10 mg (10 mg Oral Given 08/27/23 0826)   immune globulin  (human) (IgG) 30 g in 300 mL (GAMUNEX-C ) infusion (0 g IntraVENOUS Completed 08/27/23 1115)

## 2023-09-01 NOTE — Addendum Note (Signed)
 Encounter addended by: Matilde Norris on: 09/01/2023 6:28 AM   Actions taken: Charge Capture section accepted

## 2023-09-02 ENCOUNTER — Telehealth (HOSPITAL_BASED_OUTPATIENT_CLINIC_OR_DEPARTMENT_OTHER): Payer: Self-pay

## 2023-09-02 DIAGNOSIS — D809 Immunodeficiency with predominantly antibody defects, unspecified: Secondary | ICD-10-CM

## 2023-09-02 NOTE — Telephone Encounter (Signed)
 Infusion dates changed as specified and signed

## 2023-09-02 NOTE — Telephone Encounter (Signed)
 Patient called to reschedule September appointments of IVIG due to going out of town. Pt requested to reschedule her appointments to accommodate her trip. Pt rescheduled September appointment from 09/24/23 to 10/06/23. Also due to that change, appointments after that date were rescheduled as requested per patient to accommodate new infusion dates.  Patients appointment rescheduled.    Burna,  Rocio Rocha

## 2023-09-10 ENCOUNTER — Other Ambulatory Visit: Attending: Internal Medicine

## 2023-09-10 DIAGNOSIS — E119 Type 2 diabetes mellitus without complications: Secondary | ICD-10-CM | POA: Insufficient documentation

## 2023-09-10 DIAGNOSIS — Z0000 Encounter for general adult medical examination without abnormal findings: Secondary | ICD-10-CM

## 2023-09-10 DIAGNOSIS — I152 Hypertension secondary to endocrine disorders: Secondary | ICD-10-CM | POA: Insufficient documentation

## 2023-09-10 DIAGNOSIS — E1159 Type 2 diabetes mellitus with other circulatory complications: Secondary | ICD-10-CM | POA: Insufficient documentation

## 2023-09-10 DIAGNOSIS — D649 Anemia, unspecified: Secondary | ICD-10-CM

## 2023-09-10 LAB — CBC WITH DIFF, BLOOD
ANC-Automated: 4 1000/mm3 (ref 1.6–7.0)
Abs Basophils: 0 1000/mm3 (ref <0.2)
Abs Eosinophils: 0.2 1000/mm3 (ref 0.0–0.5)
Abs Lymphs: 1 1000/mm3 (ref 0.8–3.1)
Abs Monos: 0.6 1000/mm3 (ref 0.2–0.8)
Basophils: 0.7 %
Eosinophils: 3.3 %
Hct: 37.9 % (ref 34.0–45.0)
Hgb: 12.6 g/dL (ref 11.2–15.7)
Imm Gran %: 0.3 % (ref <1)
Imm Gran Abs: 0 1000/mm3 (ref <0.1)
Lymphocytes: 17.7 %
MCH: 30.7 pg (ref 26.0–32.0)
MCHC: 33.2 g/dL (ref 32.0–36.0)
MCV: 92.4 um3 (ref 79.0–95.0)
MPV: 10.9 fL (ref 9.4–12.4)
Monocytes: 10 %
Plt Count: 235 1000/mm3 (ref 140–370)
RBC: 4.1 mill/mm3 (ref 3.90–5.20)
RDW: 12.8 % (ref 12.0–14.0)
Segs: 68 %
WBC: 5.8 1000/mm3 (ref 4.0–10.0)

## 2023-09-10 LAB — LIPID(CHOL FRACT) PANEL, BLOOD
Cholesterol: 125 mg/dL (ref <200)
HDL-Cholesterol: 46 mg/dL
LDL-Chol (Calc): 58 mg/dL (ref <160)
Non-HDL Cholesterol: 79 mg/dL
Triglycerides: 113 mg/dL (ref 10–170)

## 2023-09-10 LAB — GLYCOSYLATED HGB(A1C), BLOOD: Glyco Hgb (A1C): 6.7 % — ABNORMAL HIGH (ref 4.8–5.6)

## 2023-09-10 LAB — BASIC METABOLIC PANEL, BLOOD
Anion Gap: 11 mmol/L (ref 7–15)
BUN: 17 mg/dL (ref 8–23)
Bicarbonate: 25 mmol/L (ref 22–29)
Calcium: 9.6 mg/dL (ref 8.5–10.6)
Chloride: 101 mmol/L (ref 98–107)
Creatinine: 0.75 mg/dL (ref 0.51–0.95)
Glucose: 127 mg/dL — ABNORMAL HIGH (ref 70–99)
Potassium: 4.2 mmol/L (ref 3.5–5.1)
Sodium: 137 mmol/L (ref 136–145)
eGFR Based on CKD-EPI 2021 Equation: >60 mL/min/1.73 m2

## 2023-09-10 NOTE — Interdisciplinary (Signed)
 Blood drawn from right arm with 23 gauge needle. 3 tubes taken.   Patient identity authenticated by Jearl Klinefelter.

## 2023-09-11 ENCOUNTER — Ambulatory Visit (INDEPENDENT_AMBULATORY_CARE_PROVIDER_SITE_OTHER): Admitting: Internal Medicine

## 2023-09-11 VITALS — BP 112/67 | HR 69 | Temp 97.7°F | Ht 62.5 in | Wt 148.0 lb

## 2023-09-11 DIAGNOSIS — A310 Pulmonary mycobacterial infection: Secondary | ICD-10-CM

## 2023-09-11 DIAGNOSIS — E663 Overweight: Secondary | ICD-10-CM

## 2023-09-11 DIAGNOSIS — E1169 Type 2 diabetes mellitus with other specified complication: Secondary | ICD-10-CM

## 2023-09-11 DIAGNOSIS — Z1211 Encounter for screening for malignant neoplasm of colon: Secondary | ICD-10-CM

## 2023-09-11 DIAGNOSIS — Z9889 Other specified postprocedural states: Secondary | ICD-10-CM

## 2023-09-11 DIAGNOSIS — E119 Type 2 diabetes mellitus without complications: Secondary | ICD-10-CM

## 2023-09-11 DIAGNOSIS — I10 Essential (primary) hypertension: Secondary | ICD-10-CM

## 2023-09-11 DIAGNOSIS — F339 Major depressive disorder, recurrent, unspecified: Secondary | ICD-10-CM

## 2023-09-11 DIAGNOSIS — E785 Hyperlipidemia, unspecified: Secondary | ICD-10-CM

## 2023-09-11 DIAGNOSIS — E1159 Type 2 diabetes mellitus with other circulatory complications: Secondary | ICD-10-CM

## 2023-09-11 DIAGNOSIS — L84 Corns and callosities: Secondary | ICD-10-CM

## 2023-09-11 DIAGNOSIS — I152 Hypertension secondary to endocrine disorders: Secondary | ICD-10-CM

## 2023-09-11 DIAGNOSIS — Z86718 Personal history of other venous thrombosis and embolism: Secondary | ICD-10-CM

## 2023-09-11 MED ORDER — AMMONIUM LACTATE 12 % EX CREA
1.0000 | TOPICAL_CREAM | Freq: Two times a day (BID) | CUTANEOUS | 11 refills | Status: AC
Start: 2023-09-11 — End: ?

## 2023-09-11 NOTE — Progress Notes (Signed)
 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, T2DM, history of perforated diverticulitis s/p partial colectomy and colostomy in 2009 followed by 03/2022 hernia surgery with colostomy takedown, colorectal anastomosis, and creation of an ileostomy plan for takedown , 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, depression, osteopenia     Reviewed labs since last visit  09/10/23 A1C 6.7, 6.6 01/05/23, BMP gluc 127 otherwise normal, LDL 58, CBCPD WNL  Since surgery she has gained weight and maintained this weight      09/11/2023    11:05 AM 08/27/2023     8:20 AM 07/30/2023     3:02 PM 07/21/2023     8:13 AM 07/21/2023     8:12 AM 07/01/2023    11:02 AM   Date Weight Recorded   Metric 67.132 kg 68.6 kg 65.772 kg 68.7 kg 68.7 kg 67.268 kg   Pounds/Ounces 148 lb 151 lb 3.8 oz 145 lb 151 lb 7.3 oz 151 lb 7.3 oz 148 lb 4.8 oz      Denies abd pain, very occasional heartburn one episode since last visit related to eating too fast, now chewing food thoroughly,smaller portions, not taking PPI  Finally feels insides have healed. She wants to work on losing weight to help improve her health.   Discussed GLP1 as option. She would like to work on diet and lifestyle and reconsider at next appt    Notes mood improved since her health has improved.     Diarrhea resolved, increased fiber intake which has helped    Colonoscopy normal in 2015 documented by Dr Tina GI       ROS: as above or otherwise neg     Patient Active Problem List   Diagnosis    Acquired tracheobronchomegaly with bronchiectasis (CMS-HCC)    MAI (mycobacterium avium-intracellulare)  (CMS-HCC)    Cavitary lesion of lung    Gastroesophageal reflux disease, unspecified whether esophagitis present    Hoarse voice quality    Immunodeficiency with predominantly antibody defects    Mycobacterium avium complex (CMS-HCC)    s/p Robotic right upper lobectomy with en bloc lower and middle lobe wedge resection, middle lobe wedge resection    Status post Hartmann procedure (CMS-HCC)    Ventral hernia without obstruction or gangrene    Colostomy status (CMS-HCC)    Status post cataract extraction and insertion of intraocular lens of right eye    Status post cataract extraction and insertion of intraocular lens of left eye    Type 2 diabetes mellitus without complication, without long-term current use of insulin     Pseudophakia of both eyes    Posterior vitreous detachment    S/P repair of ventral hernia    Ileostomy in place (CMS-HCC)    Unspecified mood (affective) disorder    Hyperlipidemia associated with type 2 diabetes mellitus (CMS-HCC)    Nonruptured cerebral aneurysm    Recurrent major depression in remission    Mediastinal lymphadenopathy    Osteopenia of lumbar spine    Recurrent depression  Hypertension associated with type 2 diabetes mellitus (CMS-HCC)    Chronic GERD        Current medications, allergies and problem list were reviewed today from the EMR central listing.     Outpatient Medications Marked as Taking for the 09/11/23 encounter (Office Visit) with Alacia Rehmann Kaur, DO   Medication Sig Dispense Refill    albuterol  (PROVENTIL ) (2.5 MG/3ML) 0.083% nebulization 3 mL (2.5 mg) by Nebulization route every 4 hours as needed for Wheezing. 150 mL 11    atorvastatin  (LIPITOR) 20 MG tablet Take 1 tablet (20 mg) by mouth daily. 90 tablet 1    biotin  1000 MCG tablet Take 1 tablet (1,000 mcg) by mouth daily.      blood glucose meter PHARMACY: Please dispense insurance approved bluetooth enable meter. 1 each 0    Cyanocobalamin (VITAMIN B 12 PO)       ferrous sulfate  325 (65 Fe) MG tablet Take 1  tablet (325 mg) by mouth daily.      fluoxetine  (PROZAC ) 40 MG capsule Take 1 capsule (40 mg) by mouth daily. 90 capsule 3    gabapentin  (NEURONTIN ) 250 MG/5ML solution Take 6 mL (300 mg) by mouth every 8 hours. 300 mL 0    glucose blood test strip 1 strip by Other route every morning (before breakfast). 100 strip 2    glucose blood test strip 1 strip by Other route 2 times daily (before meals). 100 strip 11    lancets 1 each by Other route every morning (before breakfast). 100 each 2    lancets 1 each by Other route 2 times daily (before meals). 100 Lancet. 5    metFORMIN  (GLUCOPHAGE ) 500 mg tablet Take 1 tablet (500 mg) by mouth every morning AND 2 tablets (1,000 mg) every evening. 270 tablet 0    Multiple Vitamin (MULTIVITAMIN) TABS tablet Take 1 tablet by mouth daily.      ramipril  (ALTACE ) 5 MG capsule Take 1 capsule (5 mg) by mouth daily. 90 capsule 1    sodium chloride  7 % NEBU 4 mL by Nebulization route every 12 hours. 240 mL 11    sodium chloride  7 % NEBU USE 1 VIAL VIA NEBULIZER  EVERY 12 HOURS 720 mL 3    umeclidinium-vilanterol (ANORO ELLIPTA ) 62.5-25 MCG/ACT inhaler Inhale 1 puff by mouth daily. 60 each 11    VENTOLIN  HFA 108 (90 Base) MCG/ACT inhaler USE 2 INHALATIONS BY MOUTH  EVERY 6 HOURS AS NEEDED FOR WHEEZING 72 g 3    vitamin D3 125 MCG (5000 UT) capsule Take 1 capsule (5,000 Units) by mouth every 24 hours.      vitamin E  400 UNIT capsule Take 1 capsule (400 Units) by mouth daily.          Allergies   Allergen Reactions    Walnuts Swelling     Tongue swollen and burn        Past Surgical History:  Past Surgical History:   Procedure Laterality Date    BRONCHOSCOPY  12/2020    BRAIN SURGERY  12, 2021    CESAREAN SECTION, LOW TRANSVERSE      COLON SURGERY  2009    COLOSTOMY      CRANIOTOMY      LUNG LOBECTOMY          Social History:  Social History     Social History Narrative    Lives with brother Lynwood at home. Sister also lives in [place]  Family History:  Family History   Problem  Relation Name Age of Onset    Cancer Mother Jenkins Macario Dutton ngs and throat    Heart Disease Mother Jenkins Macario         Congrestive heart failure due to cancer    Hypertension Mother Jenkins Macario     Psychiatry Mother Jenkins Macario         Bi polar, manic depressive    Cancer Father Elsie Macario         Prostate    Diabetes Father Elsie Macario     Stroke Father Elsie Macario     Stroke M Grandmother Winifred Macario     Cancer Brother Lynwood Macario         Skin (many locations)    Diabetes Brother Lynwood Macario         Exam::  BP 112/67 (BP Location: Left arm, BP Patient Position: Sitting, BP cuff size: Regular)   Pulse 69   Temp 97.7 F (36.5 C) (Temporal)   Ht 5' 2.5 (1.588 m)   Wt 67.1 kg (148 lb)   SpO2 98%   BMI 26.64 kg/m   body mass index is 26.64 kg/m.  Const: well developed, NAD.   HEENT:  -Eyes-normal appearance, sclerae anicteric   CV: RRR, S1, S2, no murmurs, gallops, rubs, no lower extremity edema  Resp: CTAB, good effort, no accessory mm use.   GI: Soft, nontender, nondistended, normoactive bowel sounds  MS: No cyanosis. Normal muscle bulk.   Skin: No rashes along observed skin. Warm, dry.  Neuro: Grossly alert. Moving all 4 extremities.   Psych: Euthymic affect. Fair insight  Feet: mild callus on bilat feet along medial plantar surface, dry skin, 1+/= DP/PT pulses, microfilament intact bilat, wearing nailpolish     ASSESSMENT AND PLAN:  Type 2 diabetes mellitus without complication, without long-term current use of insulin  (CMS-HCC)  Hypertension associated with type 2 diabetes mellitus (CMS-HCC)  Hyperlipidemia associated with type 2 diabetes mellitus (CMS-HCC)   Pre-ulcerative calluses  09/10/23 A1C 6.7, 6.6 01/05/23, well controlled, cont metformin  500 mg in AM and 1,000 mg in PM. Repeat A1C 6 months  A. Retinopathy: Last eye exam 01/2022, encouraged to schedule appt for diabetic eye exam, referral placed  B. Nephropathy: UMA last 12/26/22 2mg /dl, CMP 88/86/75 WNL.   C. Neuropathy: done today 09/11/23   D.  Lipids: Lipids LDL 58 09/10/23. Continue atorvastatin  20mg  daily  E. 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 Continue ramapril  5mg  daily  - Consult/Referral to Outpatient General Nutrition  - Diabetic foot exam     Pre-ulcerative calluses  Skin care reviewed, recommended amlactin use nightly, proper shoewear reviewed, offered referral to podiatry  - ammonium lactate (AMLACTIN) 12 % cream; Apply 1 Application. topically 2 times daily. Rub in to affected area well  Dispense: 1 each; Refill: 11    Screening for colon cancer  - Colonoscopy Request; Future     Recurrent depression (CMS-HCC)  Stable/improved. Continue fluoxetine  40mg  daily     Ventral hernia without obstruction or gangrene  GERD  S/p laparoscopic Nissen fundoplication for GERD and repair of hiatal hernia 02/10/23   She underwent HH repair and fundoplication in Feb 2025 with significantly improved GERD symptoms.      Right-sided middle cerebral artery branch aneurysm s/p embolization on 06/2019  MRA 06/06/2021 shows  no residual, she had successful treatment of the aneurysm and no other lesion     Hx of deep vein thrombosis (DVT) of proximal vein of right lower extremity (CMS-HCC) (Primary)  Recent history of acute provoked distal vein DVT post surgery. She completed Eliquis  for 3 months and since stopped. No si/sx of DVT. She is aware of si/sx VTE and aware of ER instr     Mycobacterium avium complex (CMS-HCC)  S/p Robotic right upper lobectomy with en bloc lower and middle lobe wedge resection, middle lobe wedge resection  S/P completion of antibiotic treatment 06/08/2022, S/P DA VINCI XI-Right Robotic upper lobectomy with middle and lower  lobe wedge resection on 03/20/2021.  EGD with [facility] 02/01/2021 showed significant reflux. She is now s/p surgical takedown of a colostomy followed by laparoscopic Nissen fundoplication for GERD and repair of hiatal hernia on  02/10/23. Continue airway clearance with nebulized hypertonic saline twice daily and regular exercise as she is doing. 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 Follows with pulmonary    RV in Dec as scheduled

## 2023-09-11 NOTE — Patient Instructions (Addendum)
 Apply Amlactin over the counter once a day at nighttime to help with dry skin and callus of feet    Recommend Shingrix vaccination at local pharmacy    Recommend flu, covid, RSV vaccinations this month or in October

## 2023-09-24 ENCOUNTER — Ambulatory Visit

## 2023-09-25 ENCOUNTER — Ambulatory Visit (INDEPENDENT_AMBULATORY_CARE_PROVIDER_SITE_OTHER): Payer: Self-pay | Admitting: Internal Medicine

## 2023-10-06 ENCOUNTER — Ambulatory Visit: Admission: RE | Admit: 2023-10-06 | Discharge: 2023-10-06 | Disposition: A | Attending: Allergy

## 2023-10-06 VITALS — BP 134/73 | HR 64 | Temp 97.0°F | Resp 16 | Wt 148.4 lb

## 2023-10-06 DIAGNOSIS — D809 Immunodeficiency with predominantly antibody defects, unspecified: Secondary | ICD-10-CM | POA: Insufficient documentation

## 2023-10-06 MED ORDER — CETIRIZINE HCL 10 MG OR TABS
10.0000 mg | ORAL_TABLET | Freq: Once | ORAL | Status: AC
Start: 2023-10-06 — End: 2023-10-06
  Administered 2023-10-06: 10 mg via ORAL
  Filled 2023-10-06: qty 1

## 2023-10-06 MED ORDER — ACETAMINOPHEN 325 MG PO TABS
650.0000 mg | ORAL_TABLET | Freq: Once | ORAL | Status: AC
Start: 2023-10-06 — End: 2023-10-06
  Administered 2023-10-06: 650 mg via ORAL
  Filled 2023-10-06: qty 2

## 2023-10-06 MED ORDER — IMMUNE GLOBULIN (HUMAN) 20 GM/200ML IJ SOLN
0.5500 g/kg | Freq: Once | INTRAMUSCULAR | Status: AC
Start: 2023-10-06 — End: 2023-10-06
  Administered 2023-10-06: 30 g via INTRAVENOUS
  Filled 2023-10-06: qty 200

## 2023-10-06 MED ORDER — DEXTROSE 5 % IV SOLN
INTRAVENOUS | Status: DC
Start: 2023-10-06 — End: 2023-10-06

## 2023-10-06 NOTE — Interdisciplinary (Signed)
 Nursing Note -  Infusion Center  78 year old female @ [facility] Infusion [facility] for IVIG.     ICD-10-CM ICD-9-CM   1. Immunodeficiency with predominantly antibody defects  D80.9 279.19     Chief Complaint:  Infusion Non Chemotherapy    Vital Signs:  BP 134/73   Pulse 64   Temp 97 F (36.1 C) (Temporal)   Resp 16   Wt 67.3 kg (148 lb 5.9 oz)   SpO2 97%   BMI 26.70 kg/m   Pre-treatment nursing assessment  ROS is negative for fever, chills, nausea, vomiting, shortness of breath, cough, or chest pain.   Access:  Right PIV  Treatment:  Patient tolerated infusion well and without complication.   PIV flushed, removed, and pressure dressing applied  Discharge Plan:  Return in 4 weeks (on 11/03/2023) for non chemo.  Discharge instructions given to patient.  Future appointments:date given and reviewed with treatment plan.  Discharge Mode: Ambulatory  Discharge Time: 1055  Accompanied by: Self  Discharged To: Home   Treatment Administration:  Medications   dextrose  5% infusion (0 mL IntraVENOUS Completed 10/06/23 1054)   acetaminophen  (TYLENOL ) tablet 650 mg (650 mg Oral Given 10/06/23 0817)   cetirizine  (ZYRTEC ) tablet 10 mg (10 mg Oral Given 10/06/23 0817)   immune globulin  (human) (IgG) 30 g in 300 mL (GAMUNEX-C ) infusion (0 g IntraVENOUS Completed 10/06/23 1052)

## 2023-10-07 ENCOUNTER — Encounter (HOSPITAL_BASED_OUTPATIENT_CLINIC_OR_DEPARTMENT_OTHER): Payer: Self-pay | Admitting: Hospital

## 2023-10-08 NOTE — Addendum Note (Signed)
 Encounter addended by: Tressa Patty on: 10/08/2023 6:29 AM   Actions taken: Flowsheet accepted, Charge Capture section accepted

## 2023-10-22 ENCOUNTER — Ambulatory Visit

## 2023-10-30 ENCOUNTER — Other Ambulatory Visit (INDEPENDENT_AMBULATORY_CARE_PROVIDER_SITE_OTHER): Payer: Self-pay | Admitting: Internal Medicine

## 2023-10-30 DIAGNOSIS — F32A Depression, unspecified: Secondary | ICD-10-CM

## 2023-10-30 DIAGNOSIS — Z0000 Encounter for general adult medical examination without abnormal findings: Secondary | ICD-10-CM

## 2023-10-30 DIAGNOSIS — E119 Type 2 diabetes mellitus without complications: Secondary | ICD-10-CM

## 2023-10-30 DIAGNOSIS — I10 Essential (primary) hypertension: Secondary | ICD-10-CM

## 2023-10-30 DIAGNOSIS — E785 Hyperlipidemia, unspecified: Secondary | ICD-10-CM

## 2023-10-30 NOTE — Progress Notes (Signed)
 Ophthalmology Service  OPTOMETRY PROGRESS NOTE      HPI    Reasons for Visit: refraction only. Last [facility] 02/03/23 with Dr. Nguyen    CC: pt sts that she is here for diabetic eye exam per PCP. Pt states if she needs a prescription to drive, if so she would like that prescription done today. If she doesn't, then there's no need for mrx.    Ocular Medications: none    Ocular History:   --DM2 without DR  --CEIOL OU 2023  --PVD OU    Family Ocular History:  [-] Glaucoma:  [-] AMD:  [-] RD:    Medical Dx: DM2, HLD, HTN  --DM x 2009  --Controlled with: Metformin   --Last BS: does not check  A1C 6.7 (H) 09/10/2023  Last edited by Coye Randine CROME, OD on 10/31/2023 12:52 PM.          Assessment/ Plan:   1. Type 2 diabetes mellitus without retinopathy (CMS-HCC) (Primary)  -assessed undilated today d/t recent DFE 02/03/23  - DWP findings, benefits of tight glucose control with regular monitoring, and continued management with PCP  - Discussed importance of annual dilated eye exam, continue annual care with Dr. Nguyen for retina exam    Fundus Photos - Optos (10/31/23)  OD: few macular drusen  OS: few macular drusen    -OCT Retina  (10/31/23) - Both Eyes  OD: Drusen. Good foveal contour. No SRF/IRF or exudates.  OS: Drusen. Good foveal contour. No SRF/IRF or exudates.    2. Pseudophakia of both eyes  -stable; monitor    3. Early nonexudative age-related macular degeneration of both eyes   -DWP findings and possible visual implications  -Recommended green leafy vegetables and foods rich in antioxidants  -Monitor complete exam      Return if symptoms worsen or fail to improve.    Randine Coye, OD  Lic #[id number]

## 2023-10-30 NOTE — Telephone Encounter (Signed)
 Incoming call from pharmacy rep name: kim requesting refill    Established with: Rosalene Hedda Aurora     Last OV in department: 09/11/2023   Next OV in department: 12/09/2023    Requested Medication(s):  Requested Prescriptions     Pending Prescriptions Disp Refills    atorvastatin  (LIPITOR) 20 MG tablet 90 tablet 1     Sig: Take 1 tablet (20 mg) by mouth daily.       Is Patient out of medication: No    Send to:     [facility] [address] [place], [place] - [address] [place]   [address] [place]   [place] [address]  Phone: [phone] Fax: [phone]       Encounter created by Hadassah Kevin Sauers from the Care Assist Team.  Signature generated from controlled access password on October 30, 2023 at 3:36 PM.  If further action required please route encounter to appropriate in-clinic POOL.

## 2023-10-31 ENCOUNTER — Encounter (HOSPITAL_BASED_OUTPATIENT_CLINIC_OR_DEPARTMENT_OTHER): Payer: Self-pay | Admitting: Allergy

## 2023-10-31 ENCOUNTER — Ambulatory Visit (INDEPENDENT_AMBULATORY_CARE_PROVIDER_SITE_OTHER): Payer: Vision Other Private Insurance

## 2023-10-31 DIAGNOSIS — H353131 Nonexudative age-related macular degeneration, bilateral, early dry stage: Secondary | ICD-10-CM

## 2023-10-31 DIAGNOSIS — Z961 Presence of intraocular lens: Secondary | ICD-10-CM

## 2023-10-31 DIAGNOSIS — E119 Type 2 diabetes mellitus without complications: Secondary | ICD-10-CM

## 2023-10-31 NOTE — Patient Instructions (Signed)
-  There are no signs of diabetic retinopathy  -There are early signs of macular degeneration in both eyes  -Recommended green leafy vegetables (spinach, kale, swiss chard) and foods rich in antioxidants (blueberries, carrots)

## 2023-11-01 NOTE — Telephone Encounter (Signed)
 Tiffany Chen, CPhT  (Rx Refill and [facility] Clinic)      Statin Refill Protocol    Last visit in enc specialty: 09/11/2023     Recent Visits in This Encounter Department       Date Provider Department Visit Type Primary Dx    09/11/2023 Rosalene Hedda [place], DO [facility] Internal Medicine Office Visit Type 2 diabetes mellitus without complication, without long-term current use of insulin     07/01/2023 Rosalene Hedda [place], DO [facility] Internal Medicine Office Visit Normocytic anemia    05/27/2023 Rosalene Hedda [place], DO [facility] Internal Medicine Office Visit Acute deep vein thrombosis (DVT) of proximal vein of right lower extremity (CMS-HCC)    01/23/2023 Rosalene Hedda [place], DO [facility] Internal Medicine Office Visit RUQ abdominal pain    12/26/2022 Rosalene Hedda [place], DO [facility] Internal Medicine Office Visit Type 2 diabetes mellitus without complication, without long-term current use of insulin  (CMS-HCC)           Recommended Follow up From Recent Visits       Date Provider Department Visit Type Follow Up    09/11/2023 Rosalene Hedda [place], DO [facility] Internal Medicine Office Visit No Follow-up on file.    07/01/2023 Rosalene Hedda [place], DO [facility] Internal Medicine Office Visit Return for 3 month and 6 month followup for diabetes, schedule AWV as separate appt ok for VV.      05/27/2023 Rosalene Hedda [place], DO [facility] Internal Medicine Office Visit No Follow-up on file.    01/23/2023 Nakai, Tejpreet Kaur, DO [facility] Internal Medicine Office Visit No Follow-up on file.    12/26/2022 Nakai, Tejpreet Kaur, DO [facility] Internal Medicine Office Visit Return in about 1 month (around 01/26/2023) for follow-up of lab results.             Population Health Visits  Recent PHSO Visits    None        Next f/u appt due:  None stated    Next appt in enc specialty: 12/09/2023      Future Appointments 11/01/2023 - 10/30/2028        Date Visit Type Department Provider     11/03/2023  8:00 AM INFUSION 3 HR [place] [facility] INFUSION CENTER MOP INFUSION    Appointment Notes:     IVIG 25g             12/01/2023  8:00 AM INFUSION 3 HR [place] [facility] INFUSION CENTER MOP INFUSION    Appointment Notes:     IVIG 25g             12/09/2023  9:15 AM MEDICARE ANNUAL WELLNESS VISIT [facility] Internal Medicine Rosalene Hedda Aurora, DO    Appointment Notes:     MAW             01/28/2024 10:45 AM RETURN IMMUNODEFICIENCY [place] [facility] Luke Marsa Lie, MD    Appointment Notes:     6 months follow-up immuno             02/02/2024  1:00 PM RETURN OPHTHALMOLOGY [place] [facility] Leontine Jaynie Cower, MD    Appointment Notes:  Return in about 1 year (around 02/03/2024).                    Per OV  09/11/23  HPI-  A/P-Type 2 diabetes mellitus without complication, without long-term current use of insulin  (CMS-HCC)  Hypertension associated with type 2 diabetes mellitus (CMS-HCC)  Hyperlipidemia associated with type 2 diabetes mellitus (CMS-HCC)   Pre-ulcerative calluses  09/10/23 A1C 6.7, 6.6 01/05/23, well controlled, cont metformin  500 mg in AM and 1,000 mg in PM. Repeat A1C 6 months  A. Retinopathy: Last eye exam 01/2022, encouraged to schedule appt for diabetic eye exam, referral placed  B. Nephropathy: UMA last 12/26/22 2mg /dl, CMP 88/86/75 WNL.   C. Neuropathy: done today 09/11/23   D. Lipids: Lipids LDL 58 09/10/23. Continue atorvastatin  20mg  daily  E. 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 Continue ramapril  5mg  daily  - Consult/Referral to Outpatient General Nutrition  - Diabetic foot exam          LABS required:  (Q year Lipid Panel, ALT (baseline only))    Lab Results   Component Value Date     CHOL 125 09/10/2023    TRIG 113 09/10/2023    HDL 46 09/10/2023    NHDLV 79 09/10/2023    LDLCALC 58 09/10/2023        Lab Results   Component Value Date    ALT 29 06/10/2023         Monitoring required:  (None)

## 2023-11-02 ENCOUNTER — Encounter (HOSPITAL_BASED_OUTPATIENT_CLINIC_OR_DEPARTMENT_OTHER): Payer: Self-pay | Admitting: Allergy

## 2023-11-03 ENCOUNTER — Ambulatory Visit
Admission: RE | Admit: 2023-11-03 | Discharge: 2023-11-03 | Disposition: A | Discharge status: Home / Self Care | Attending: Allergy | Admitting: Allergy

## 2023-11-03 VITALS — BP 146/75 | HR 69 | Temp 96.7°F | Resp 16 | Wt 150.4 lb

## 2023-11-03 DIAGNOSIS — D809 Immunodeficiency with predominantly antibody defects, unspecified: Secondary | ICD-10-CM | POA: Insufficient documentation

## 2023-11-03 MED ORDER — RAMIPRIL 5 MG OR CAPS
5.0000 mg | ORAL_CAPSULE | Freq: Every day | ORAL | 1 refills | Status: AC
Start: 2023-11-03 — End: ?

## 2023-11-03 MED ORDER — IMMUNE GLOBULIN (HUMAN) 20 GM/200ML IJ SOLN
0.5500 g/kg | Freq: Once | INTRAMUSCULAR | Status: AC
Start: 2023-11-03 — End: 2023-11-03
  Administered 2023-11-03: 30 g via INTRAVENOUS
  Filled 2023-11-03: qty 200

## 2023-11-03 MED ORDER — METFORMIN HCL 500 MG OR TABS
ORAL_TABLET | ORAL | 0 refills | Status: AC
Start: 2023-11-03 — End: ?

## 2023-11-03 MED ORDER — ATORVASTATIN CALCIUM 20 MG OR TABS
20.0000 mg | ORAL_TABLET | Freq: Every day | ORAL | 3 refills | Status: AC
Start: 2023-11-03 — End: ?

## 2023-11-03 MED ORDER — FLUOXETINE HCL 40 MG OR CAPS
40.0000 mg | ORAL_CAPSULE | Freq: Every day | ORAL | 3 refills | Status: AC
Start: 2023-11-03 — End: ?

## 2023-11-03 MED ORDER — ACETAMINOPHEN 325 MG PO TABS
650.0000 mg | ORAL_TABLET | Freq: Once | ORAL | Status: AC
Start: 2023-11-03 — End: 2023-11-03
  Administered 2023-11-03: 650 mg via ORAL
  Filled 2023-11-03: qty 2

## 2023-11-03 MED ORDER — CETIRIZINE HCL 10 MG OR TABS
10.0000 mg | ORAL_TABLET | Freq: Once | ORAL | Status: AC
Start: 2023-11-03 — End: 2023-11-03
  Administered 2023-11-03: 10 mg via ORAL
  Filled 2023-11-03: qty 1

## 2023-11-03 MED ORDER — DEXTROSE 5 % IV SOLN
INTRAVENOUS | Status: DC
Start: 2023-11-03 — End: 2023-11-03

## 2023-11-03 NOTE — Interdisciplinary (Signed)
 Non-Chemotherapy Infusion Nursing Note -  Infusion Center    Tiffany Chen is a 78 year old female who presents for infusion of IVIG 30 g    Problem List[1]    Vitals:    11/03/23 0809 11/03/23 1100   BP: 133/80 146/75   BP Location: Right arm Right arm   BP Patient Position: Sitting Semi-Fowlers   Pulse: 77 69   Resp: 17 16   Temp: 96.4 F (35.8 C) 96.7 F (35.9 C)   TempSrc: Temporal Temporal   SpO2: 96% 97%   Weight: 68.2 kg (150 lb 5.7 oz)      Pain Score: 0  Body surface area is 1.73 meters squared.  Body mass index is 27.06 kg/m.    Pre-treatment nursing assessment:  No problems identified upon assessment.    Medications   dextrose  5% infusion (0 mL IntraVENOUS Stopped 11/03/23 1059)   acetaminophen  (TYLENOL ) tablet 650 mg (650 mg Oral Given 11/03/23 0821)   cetirizine  (ZYRTEC ) tablet 10 mg (10 mg Oral Given 11/03/23 0822)   immune globulin  (human) (IgG) 30 g in 300 mL (GAMUNEX-C ) infusion (0 g IntraVENOUS Completed 11/03/23 1059)        Tiffany Chen tolerated treatment well.  Observation: Not ordered    Post blood return: Brisk  Post-Flush: NS and Discontinued IV    Patient Education  Learner: Patient  Barriers to learning: No Barriers  Readiness to learn: Acceptance  Method: Explanation    Treatment Education: Information/teaching given to patient including: signs and symptoms of infection, bleeding, adverse reaction(s), symptom control, and when to notify MD.    [place] Prevention Education: Instructed patient to call for assistance.    Pain Education: Patient instructed to contact nurse if pain should develop or if their current pain therapy becomes ineffective.    Response: Verbalizes understanding    Discharge Plan  Discharge instructions given to patient.  Future appointments given and reviewed with treatment plan.  Discharge Mode: Ambulatory  Discharge Time: 1103  Accompanied by: Self  Discharged To: Home          [1]   Patient Active Problem List  Diagnosis    Acquired  tracheobronchomegaly with bronchiectasis (CMS-HCC)    MAI (mycobacterium avium-intracellulare) (CMS-HCC)    Cavitary lesion of lung    Gastroesophageal reflux disease, unspecified whether esophagitis present    Hoarse voice quality    Immunodeficiency with predominantly antibody defects    Mycobacterium avium complex    s/p Robotic right upper lobectomy with en bloc lower and middle lobe wedge resection, middle lobe wedge resection    Status post Hartmann procedure (CMS-HCC)    Ventral hernia without obstruction or gangrene    Colostomy status (CMS-HCC)    Status post cataract extraction and insertion of intraocular lens of right eye    Status post cataract extraction and insertion of intraocular lens of left eye    Type 2 diabetes mellitus without complication, without long-term current use of insulin     Pseudophakia of both eyes    Posterior vitreous detachment    S/P repair of ventral hernia    Ileostomy in place (CMS-HCC)    Unspecified mood (affective) disorder    Hyperlipidemia associated with type 2 diabetes mellitus (CMS-HCC)    Nonruptured cerebral aneurysm    Recurrent major depression in remission    Mediastinal lymphadenopathy    Osteopenia of lumbar spine    Recurrent depression    Hypertension associated with type 2 diabetes mellitus (CMS-HCC)  Chronic GERD

## 2023-11-03 NOTE — Discharge Instructions (Signed)
 For any questions or concerns, you can call your Hematology/Oncology doctor's office, Monday - Friday, during business hours.   Please keep the clinic phone number in an easy to find place.     If you are unable to reach your nurse AND you have urgent symptoms, call [facility] at [phone] from 8 a.m. to 9 p.m, Monday through Friday.     For medical emergencies, please call 911    Symptoms that may require emergency care:    Allergic reaction: itching or hives, swelling in your face and hands, swelling or tingling in your mouth or throat, chest tightness, trouble breathing  Temperature greater than or equal to 100.5 degrees Fahrenheit  Sudden onset of chest pain or shortness of breath, worsening shortness of breath  Abdominal pain with nausea, vomiting, or fever  Numbness or weakness in your face or limbs  Lightheadedness, dizziness, or fainting  Unusual bleeding, bruising, or weakness    For urgent concerns after hours, weekdays, and holidays, you may call the on-call Hematology/Oncology doctor:    [facility]  [place] and [place]:  [phone]    [place], [place], and [place] [facility]  [facility]  [facility] at [address]  [phone]    Patients are advised to contact the infusion scheduling line at [phone] to coordinate appointments and ensure timely access to treatment.   For [place], [employer], and [employer], please press 2  For [facility] locations: [place], [place], and [place], please press 3

## 2023-11-03 NOTE — Telephone Encounter (Signed)
 -----------------------------------------------------------------------------------------------------------    The technician note was evaluated by the pharmacist.  Pharmacist Assessment and Plan:    The following med(s) were added by the pharmacist for medication synchronization:     metFORMIN  (GLUCOPHAGE ) 500 mg tablet     Sig: Take 1 tablet (500 mg) by mouth every morning AND 2 tablets (1,000 mg) every evening.     Dispense:  270 tablet     Refill:  0    ramipril  (ALTACE ) 5 MG capsule     Sig: Take 1 capsule (5 mg) by mouth daily.     Dispense:  90 capsule     Refill:  1    FLUoxetine  (PROZAC ) 40 MG capsule     Sig: Take 1 capsule (40 mg) by mouth daily.     Dispense:  90 capsule     Refill:  3      **I have reviewed recent provider OV notes, labs and dispense hx per rph medsync protocol  3 meds synchronized per [facility] protocol      [facility]   Refill and Prior Auth Clinical Services  Phone:  [phone]  Ext:  [phone]      Biguanide Refill Protocol      LABS required:  (Q 86mo HgbA1C) (Q year Creatinine, Lipid Panel(pop), ALT, Microalbumin)    Lab Results   Component Value Date    A1C 6.7 (H) 09/10/2023    A1C 6.6 (H) 12/26/2022    A1C 6.2 (H) 08/05/2022       No results found for: A1CP    Lab Results   Component Value Date    NA 137 09/10/2023    K 4.2 09/10/2023    CL 101 09/10/2023    BICARB 25 09/10/2023    BUN 17 09/10/2023    CREAT 0.75 09/10/2023       Lab Results   Component Value Date    GFRNON >60 11/06/2020    EGFRCKDEPI >60 09/10/2023       Lab Results   Component Value Date    CHOL 125 09/10/2023    TRIG 113 09/10/2023    HDL 46 09/10/2023    NHDLV 79 09/10/2023    LDLCALC 58 09/10/2023        Lab Results   Component Value Date    ALT 29 06/10/2023       No results found for: [facility]    Lab Results   Component Value Date    MICALB <1.2 12/26/2022       Lab Results   Component Value Date    MACRR Unable to calculate 12/26/2022       No results found for: MICROALBUMIN,  MCRALCREAT    Lab Results   Component Value Date    CREATUR 146 12/26/2022         Monitoring required:  (Q year BP)    (BP range: Systolic=90-150  Ipjdunopr=49-09)  Blood Pressure   11/03/23 133/80   10/06/23 134/73   09/11/23 112/67         Last Digital Health Monitoring Vitals:         Last Digital Health Monitoring BG:        Last MyChart BP Values:        Last Pt Entered MyChart BP Values:               If information is available and meets protocol criteria: May refill up to SIX MONTHS    ACE Inhibitor  Refill Protocol      LABS required:  (Q year Potassium, Creatinine)    Lab Results   Component Value Date    NA 137 09/10/2023    K 4.2 09/10/2023    CL 101 09/10/2023    BICARB 25 09/10/2023    BUN 17 09/10/2023    CREAT 0.75 09/10/2023       Lab Results   Component Value Date    GFRNON >60 11/06/2020    EGFRCKDEPI >60 09/10/2023         Monitoring required:  (Q year BP) (pregnancy prn)  *If pt is pregnant, DEFER TO MD*    (BP range: Systolic=90-150  Ipjdunopr=49-09)  Blood Pressure   11/03/23 133/80   10/06/23 134/73   09/11/23 112/67       Pulse Readings from Last 3 Encounters:   11/03/23 77   10/06/23 64   09/11/23 69         Last Digital Health Monitoring Vitals:        Last MyChart BP Values:        Last Pt Entered MyChart BP Values:           Serotonin Reuptake Inhibitor Refill Protocol (or Trazodone or Buspar)      LABS required:  (Q yr SCr (paxil only; lexapro removed creat req on 10/04/19))    Lab Results   Component Value Date    NA 137 09/10/2023    K 4.2 09/10/2023    CL 101 09/10/2023    BICARB 25 09/10/2023    BUN 17 09/10/2023    CREAT 0.75 09/10/2023       Lab Results   Component Value Date    GFRNON >60 11/06/2020    EGFRCKDEPI >60 09/10/2023         Monitoring required:  (Q year BP, HR)   *EKG/QTc at appropriate interval for irregular HR in pts w CVD and/or risk factors assoc w  QTc prolongation (High Dose/DDi/Age etc)*    (BP range: Systolic=90-150  Ipjdunopr=49-09)  Blood Pressure    11/03/23 133/80   10/06/23 134/73   09/11/23 112/67       (HR range: 55-110)  Pulse Readings from Last 3 Encounters:   11/03/23 77   10/06/23 64   09/11/23 69         Last Digital Health Monitoring Vitals:        Last MyChart BP Values:        Last Pt Entered MyChart BP Values:

## 2023-11-10 NOTE — Addendum Note (Signed)
 Encounter addended by: Andy Andes on: 11/10/2023 10:05 AM   Actions taken: Flowsheet accepted, Charge Capture section accepted

## 2023-11-19 ENCOUNTER — Ambulatory Visit

## 2023-12-01 ENCOUNTER — Ambulatory Visit: Admission: RE | Admit: 2023-12-01 | Discharge: 2023-12-01 | Disposition: A | Attending: Allergy

## 2023-12-01 VITALS — BP 134/72 | HR 62 | Temp 97.2°F | Resp 16 | Ht 62.21 in | Wt 150.1 lb

## 2023-12-01 DIAGNOSIS — D809 Immunodeficiency with predominantly antibody defects, unspecified: Secondary | ICD-10-CM | POA: Insufficient documentation

## 2023-12-01 LAB — IGG, BLOOD: IGG: 953 mg/dL (ref 700–1600)

## 2023-12-01 MED ORDER — DIPHENHYDRAMINE HCL 25 MG OR TABS OR CAPS CUSTOM
25.0000 mg | ORAL_CAPSULE | Freq: Once | ORAL | Status: DC | PRN
Start: 2023-12-01 — End: 2023-12-01

## 2023-12-01 MED ORDER — HYDROCORTISONE SOD SUCCINATE 100 MG IJ SOLR (CUSTOM)
100.0000 mg | Freq: Once | INTRAMUSCULAR | Status: AC | PRN
Start: 2023-12-01 — End: 2023-12-01

## 2023-12-01 MED ORDER — ACETAMINOPHEN 325 MG PO TABS
650.0000 mg | ORAL_TABLET | Freq: Once | ORAL | Status: AC
Start: 2023-12-01 — End: 2023-12-01
  Administered 2023-12-01: 650 mg via ORAL
  Filled 2023-12-01: qty 2

## 2023-12-01 MED ORDER — FAMOTIDINE (PF) 20 MG/2ML IV SOLN
20.0000 mg | Freq: Once | INTRAVENOUS | Status: AC | PRN
Start: 2023-12-01 — End: 2023-12-01

## 2023-12-01 MED ORDER — DEXTROSE 5 % IV SOLN
INTRAVENOUS | Status: DC
Start: 2023-12-01 — End: 2023-12-01

## 2023-12-01 MED ORDER — DIPHENHYDRAMINE HCL 50 MG/ML IJ SOLN
25.0000 mg | Freq: Once | INTRAMUSCULAR | Status: AC | PRN
Start: 2023-12-01 — End: 2023-12-01

## 2023-12-01 MED ORDER — IMMUNE GLOBULIN (HUMAN) 20 GM/200ML IJ SOLN
0.5500 g/kg | Freq: Once | INTRAMUSCULAR | Status: AC
Start: 2023-12-01 — End: 2023-12-01
  Administered 2023-12-01: 30 g via INTRAVENOUS
  Filled 2023-12-01: qty 100

## 2023-12-01 MED ORDER — CETIRIZINE HCL 10 MG OR TABS
10.0000 mg | ORAL_TABLET | Freq: Once | ORAL | Status: AC
Start: 2023-12-01 — End: 2023-12-01
  Administered 2023-12-01: 10 mg via ORAL
  Filled 2023-12-01: qty 1

## 2023-12-01 NOTE — Discharge Instructions (Addendum)
 For any questions or concerns, you can call your doctor's office, Monday - Friday, during business hours.   Please keep the clinic phone number in an easy to find place.       For medical emergencies, please call 911    Symptoms that may require emergency care:    Allergic reaction: itching or hives, swelling in your face and hands, swelling or tingling in your mouth or throat, chest tightness, trouble breathing  Temperature greater than or equal to 100.5 degrees Fahrenheit  Sudden onset of chest pain or shortness of breath, worsening shortness of breath  Abdominal pain with nausea, vomiting, or fever  Numbness or weakness in your face or limbs  Lightheadedness, dizziness, or fainting  Unusual bleeding, bruising, or weakness    For urgent concerns after hours, weekdays, and holidays, you may call the on-call Hematology/Oncology doctor:    [facility]  [place]:  [phone]    [place], [place], and [place] [facility]  [facility]  [facility] at [address]  [phone]    Patients are advised to contact the infusion scheduling line at [phone] to coordinate appointments and ensure timely access to treatment.   For [place], [employer], and [employer], please press 2  For [facility] locations: [place], [place], and [place], please press 3

## 2023-12-01 NOTE — Addendum Note (Signed)
 Encounter addended by: Syncere Kaminski, RN on: 12/01/2023 4:54 PM   Actions taken: Treatment plan modified

## 2023-12-01 NOTE — Interdisciplinary (Signed)
 Non-Chemotherapy Infusion Nursing Note - [facility]    Tiffany Chen is a 78 year old female who presents for infusion of IVIG+ labs. Patient arrived ambulatory in stable condition. Denies nausea, vomiting, diarrhea, or constipation. Denies fevers or recent illness    PIV started to: RFA                      Positive blood return. #1 attempt. Flushed with 10ml NS and capped.   Labs drawn: IgG        Problem List[1]    Vitals:    12/01/23 0817   BP: 144/80   BP Location: Left arm   BP Patient Position: Sitting   Pulse: 72   Resp: 16   Temp: 97 F (36.1 C)   TempSrc: Temporal   SpO2: 98%   Weight: 68.1 kg (150 lb 2.1 oz)   Height: 5' 2.21 (1.58 m)     Pain Score: NA (pre med, non-pain or scheduled)  Body surface area is 1.73 meters squared.  Body mass index is 27.27 kg/m.    Pre-treatment nursing assessment:  No problems identified upon assessment.    IVIG initiated @ 0.06 g/kg/hr x 30 mins, titrated Q 30 mins to max rate of 0.48 g/kg/hr.    Tiffany Chen tolerated treatment well.  Observation: Not ordered    Post blood return: Brisk  Post-Flush: NS and Discontinued IV  Medications   dextrose  5% infusion (0 mL IntraVENOUS Stopped 12/01/23 1110)   diphenhydrAMINE  (BENADRYL ) tablet or capsule 25 mg (has no administration in time range)   diphenhydrAMINE  (BENADRYL ) injection 25 mg (has no administration in time range)   hydrocortisone  sodium succinate (SOLUCORTEF) injection 100 mg (has no administration in time range)   famotidine  (PEPCID ) injection 20 mg (has no administration in time range)   acetaminophen  (TYLENOL ) tablet 650 mg (650 mg Oral Given 12/01/23 0828)   cetirizine  (ZYRTEC ) tablet 10 mg (10 mg Oral Given 12/01/23 0829)   immune globulin  (human) (IgG) 30 g in 300 mL (GAMUNEX-C ) infusion (0 g IntraVENOUS Completed 12/01/23 1104)        Patient Education  Learner: Patient  Barriers to learning: No Barriers  Readiness to learn: Acceptance  Method: Explanation    Treatment Education:  Information/teaching given to patient including: signs and symptoms of infection, bleeding, adverse reaction(s), symptom control, and when to notify MD.    [place] Prevention Education: Instructed patient to call for assistance.    Pain Education: Patient instructed to contact nurse if pain should develop or if their current pain therapy becomes ineffective.    Response: Verbalizes understanding    Discharge Plan  Discharge instructions given to patient.  Future appointments given and reviewed with treatment plan.  Discharge Mode: Ambulatory  Discharge Time: 1115  Accompanied by: Self  Discharged To: Home          [1]   Patient Active Problem List  Diagnosis    Acquired tracheobronchomegaly with bronchiectasis (CMS-HCC)    MAI (mycobacterium avium-intracellulare) (CMS-HCC)    Cavitary lesion of lung    Gastroesophageal reflux disease, unspecified whether esophagitis present    Hoarse voice quality    Immunodeficiency with predominantly antibody defects    Mycobacterium avium complex    s/p Robotic right upper lobectomy with en bloc lower and middle lobe wedge resection, middle lobe wedge resection    Status post Hartmann procedure (CMS-HCC)    Ventral hernia without obstruction or gangrene  Colostomy status (CMS-HCC)    Status post cataract extraction and insertion of intraocular lens of right eye    Status post cataract extraction and insertion of intraocular lens of left eye    Type 2 diabetes mellitus without complication, without long-term current use of insulin     Pseudophakia of both eyes    Posterior vitreous detachment    S/P repair of ventral hernia    Ileostomy in place (CMS-HCC)    Unspecified mood (affective) disorder    Hyperlipidemia associated with type 2 diabetes mellitus (CMS-HCC)    Nonruptured cerebral aneurysm    Recurrent major depression in remission    Mediastinal lymphadenopathy    Osteopenia of lumbar spine    Recurrent depression    Hypertension associated with type 2 diabetes mellitus  (CMS-HCC)    Chronic GERD

## 2023-12-03 ENCOUNTER — Encounter: Payer: Self-pay | Admitting: Hospital

## 2023-12-03 NOTE — Addendum Note (Signed)
 Encounter addended by: Matilde Norris on: 12/03/2023 7:43 AM   Actions taken: Charge Capture section accepted

## 2023-12-08 ENCOUNTER — Other Ambulatory Visit: Payer: Self-pay

## 2023-12-08 NOTE — Telephone Encounter (Signed)
 Population Health Services Quality Outreach       Subjective  The CN contacted regarding Medicare Annual Wellness Questionnaire    HM Care Gaps Due:   Health Maintenance Due   Topic Date Due    Advance Care Planning  Never done    RSV Vaccine (1 - 1-dose 75+ series) Never done    Medicare Annual Wellness Visit  12/19/2022    Colorectal Cancer Screening  06/28/2023    Influenza (1) 08/08/2023    COVID-19 Vaccine (8 - 2025-26 season) 09/08/2023    PHQ9 Depression Monitoring  09/27/2023    Diabetes Urine Microalbumin  12/26/2023        Contact Outcome:MyChart message sent to Pt to complete Medicare Annual Wellness Questionnaire and Unable to reach patient. Left voice mail with direct contact information provided.     OUTREACH PURPOSE: Medicare Annual Wellness     Upcoming Follow-Up Visits:   12/09/2023  9:15 AM      Plan: No further action needed and This is the 2nd outreach attempt

## 2023-12-08 NOTE — Progress Notes (Unsigned)
 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 Repeat A1C 6 months  A. Retinopathy: Last eye exam 01/2022, encouraged to schedule appt for diabetic eye exam, referral placed  B. Nephropathy: UMA last 12/26/22 2mg /dl, CMP 88/86/75 WNL.   C. Neuropathy: done today 09/11/23   D. Lipids: Lipids LDL 58 09/10/23. Continue atorvastatin  20mg  daily  E. 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 Continue ramapril  5mg  daily  - Consult/Referral to Outpatient General Nutrition  - Diabetic foot exam      Pre-ulcerative calluses  Skin care reviewed, recommended amlactin use nightly, proper shoewear reviewed, offered referral to podiatry  - ammonium lactate  (AMLACTIN) 12 % cream; Apply 1 Application. topically 2 times daily. Rub in to affected area well  Dispense: 1 each; Refill: 11     Screening for colon cancer  - Colonoscopy Request; Future      Recurrent depression (CMS-HCC)  Stable/improved. Continue fluoxetine  40mg  daily     Ventral hernia without obstruction or gangrene  GERD  S/p laparoscopic Nissen  fundoplication for GERD and repair of hiatal hernia 02/10/23   She underwent HH repair and fundoplication in Feb 2025 with significantly improved GERD symptoms.      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 She completed Eliquis  for 3 months and since stopped. No si/sx of DVT. She is aware of si/sx VTE and aware of ER instr     Mycobacterium avium complex (CMS-HCC)  S/p Robotic right upper lobectomy with en bloc lower and middle lobe wedge resection, middle lobe wedge resection  S/P completion of antibiotic treatment 06/08/2022, S/P DA VINCI XI-Right Robotic upper lobectomy with middle and lower  lobe wedge resection on 03/20/2021.  EGD with [facility] 02/01/2021 showed significant reflux. She is now s/p surgical takedown of a colostomy followed by laparoscopic Nissen fundoplication for GERD and repair of hiatal hernia on 02/10/23. Continue airway clearance with nebulized hypertonic saline twice daily and regular exercise as she is doing. 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 Follows with pulmonary

## 2023-12-09 ENCOUNTER — Ambulatory Visit (INDEPENDENT_AMBULATORY_CARE_PROVIDER_SITE_OTHER): Admitting: Internal Medicine

## 2023-12-09 VITALS — BP 129/67 | HR 75 | Temp 97.6°F | Ht 62.0 in | Wt 149.3 lb

## 2023-12-09 DIAGNOSIS — E785 Hyperlipidemia, unspecified: Secondary | ICD-10-CM

## 2023-12-09 DIAGNOSIS — A310 Pulmonary mycobacterial infection: Secondary | ICD-10-CM

## 2023-12-09 DIAGNOSIS — E119 Type 2 diabetes mellitus without complications: Secondary | ICD-10-CM

## 2023-12-09 DIAGNOSIS — E1159 Type 2 diabetes mellitus with other circulatory complications: Secondary | ICD-10-CM

## 2023-12-09 DIAGNOSIS — Z0000 Encounter for general adult medical examination without abnormal findings: Secondary | ICD-10-CM

## 2023-12-09 DIAGNOSIS — F339 Major depressive disorder, recurrent, unspecified: Secondary | ICD-10-CM

## 2023-12-09 DIAGNOSIS — I671 Cerebral aneurysm, nonruptured: Secondary | ICD-10-CM

## 2023-12-09 DIAGNOSIS — I152 Hypertension secondary to endocrine disorders: Secondary | ICD-10-CM

## 2023-12-09 DIAGNOSIS — Z9889 Other specified postprocedural states: Secondary | ICD-10-CM

## 2023-12-09 DIAGNOSIS — F322 Major depressive disorder, single episode, severe without psychotic features: Secondary | ICD-10-CM

## 2023-12-09 DIAGNOSIS — E1169 Type 2 diabetes mellitus with other specified complication: Secondary | ICD-10-CM

## 2023-12-09 DIAGNOSIS — D801 Nonfamilial hypogammaglobulinemia: Secondary | ICD-10-CM

## 2023-12-09 MED ORDER — BUPROPION XL (DAILY) 150 MG OR TB24: 150.0000 mg | ORAL_TABLET | Freq: Every morning | ORAL | 3 refills | Status: DC

## 2023-12-09 NOTE — Assessment & Plan Note (Deleted)
 Orders:    buPROPion (WELLBUTRIN XL) 150 MG XL tablet; Take 1 tablet (150 mg) by mouth every morning.

## 2023-12-09 NOTE — Patient Instructions (Addendum)
 Start wellbutrin 150mg  daily    Continue fluoxetine  as prescribed    Referred to IBH (integrated behavioral health) to provide therapy services    Referrals placed to see embedded psychiatry in primary care or department of psychiatry at [place]    If you experience thoughts of suicide or self-harm - either call the National Suicide Prevention Lifeline at 800-273-TALK 678-771-4563) to reach a trained counselor or call 911.     Alternatively, [address] has an [place] Manager at [phone].  You may call this number any time when you are in a mental health crisis.  They are helpful for both suicide prevention and crisis intervention.    To get the best possible medical care, I highly recommend that all of my patients fill out [place]  and 's official PrepareForYourCare Advance Health Directive which can be found at [email or web address] and includes easy to follow step-by-step instructions.        Please review this website and bring your questions or a copy of your Advance Health Directive to your next appointment, mail us  a copy or upload into the Advance Care Planning section of MyChart.    - Attention:     Rosalene Hedda [place]      POLST for Patients & Loved Ones  When you are seriously ill, making sure your loved ones and doctors know what kinds of medical treatment you want in a medical crisis or as you approach the end of your life is very important. POLST (Physician Orders for Life-Sustaining Treatment) can help do just that.  POLST is a form that clearly states what kinds of medical treatment patients want and don't want. Printed on bright pink paper, and signed by both a patient and physician, nurse practitioner, or physician assistant, POLST helps give seriously-ill patients more control over their care.  POLST also helps you talk with your healthcare team and your loved ones about your choices. In this way, POLST can help reduce patient and family suffering, and make sure  that your wishes are known and honored.  Have more questions about POLST and what it means? Find out more on:   POLST for Patients & Loved Ones  POLST     POLST guide: [email or web address]_ConsumerBrochure_English_Sample.pdf

## 2023-12-11 ENCOUNTER — Telehealth (INDEPENDENT_AMBULATORY_CARE_PROVIDER_SITE_OTHER): Payer: Self-pay

## 2023-12-11 NOTE — Telephone Encounter (Signed)
 Patient is scheduled 12/16/2023 with Dr. Wilbern Masters    Who called to schedule the patients appointment Patient    Does the patient have a caregiver? No  Is the patient in assisted living or skilled nursing facility? No  Is the patient wheelchair bound? No  Will the patient be assisted with transportation? No  Does the patient have any physical impairment or disability that will require assistance lifting, going to the bathroom, other accommodations etc? No    If YES to any question, please inform the caller that a visitor or caregiver must remain in the waiting room of the clinic for the entire duration of the appointment.   Please document the visitor/caregiver name - N/A Patient is best contact

## 2023-12-12 ENCOUNTER — Encounter (INDEPENDENT_AMBULATORY_CARE_PROVIDER_SITE_OTHER): Payer: Self-pay | Admitting: Hospital
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
## 2023-12-16 ENCOUNTER — Encounter (INDEPENDENT_AMBULATORY_CARE_PROVIDER_SITE_OTHER)

## 2023-12-16 ENCOUNTER — Ambulatory Visit (INDEPENDENT_AMBULATORY_CARE_PROVIDER_SITE_OTHER)

## 2023-12-16 DIAGNOSIS — F331 Major depressive disorder, recurrent, moderate: Secondary | ICD-10-CM

## 2023-12-17 ENCOUNTER — Telehealth (INDEPENDENT_AMBULATORY_CARE_PROVIDER_SITE_OTHER): Payer: Self-pay | Admitting: Clinical Child & Adolescent

## 2023-12-17 MED ORDER — BUPROPION XL (DAILY) 300 MG OR TB24: 300.0000 mg | ORAL_TABLET | Freq: Every morning | ORAL | 3 refills | Status: DC

## 2023-12-17 NOTE — Telephone Encounter (Signed)
 Discussed Integrated Behavioral Health (IBH), including benefits and available providers. Patient was scheduled for an appointment on 02/12/2024 at 12:00 pm with Vernell Bodily. Patient was instructed to complete the IBH questionnaires linked to the e-check-in prior to the appointment time.    Patient is aware of the clinic address and has been advised to arrive 20 minutes early to complete the IBH questionnaires.    Delon Price [facility]  IBH Patient Navigator  [phone]
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
## 2023-12-29 ENCOUNTER — Ambulatory Visit
Admission: RE | Admit: 2023-12-29 | Discharge: 2023-12-29 | Disposition: A | Discharge status: Home / Self Care | Attending: Allergy | Admitting: Allergy

## 2023-12-29 VITALS — BP 138/71 | HR 70 | Temp 96.7°F | Resp 16 | Ht 62.0 in | Wt 149.7 lb

## 2023-12-29 DIAGNOSIS — D809 Immunodeficiency with predominantly antibody defects, unspecified: Secondary | ICD-10-CM | POA: Insufficient documentation

## 2023-12-29 LAB — IGG, BLOOD: IGG: 940 mg/dL (ref 700–1600)

## 2023-12-29 MED ORDER — DEXTROSE 5 % IV SOLN: INTRAVENOUS | Status: DC

## 2023-12-29 MED ORDER — CETIRIZINE HCL 10 MG OR TABS
10.0000 mg | ORAL_TABLET | Freq: Once | ORAL | Status: AC
  Administered 2023-12-29: 10 mg via ORAL
  Filled 2023-12-29: qty 1

## 2023-12-29 MED ORDER — IMMUNE GLOBULIN (HUMAN) 20 GM/200ML IJ SOLN
0.5500 g/kg | Freq: Once | INTRAMUSCULAR | Status: AC
  Administered 2023-12-29: 30 g via INTRAVENOUS
  Filled 2023-12-29: qty 200

## 2023-12-29 MED ORDER — ACETAMINOPHEN 325 MG PO TABS
650.0000 mg | ORAL_TABLET | Freq: Once | ORAL | Status: AC
  Administered 2023-12-29: 650 mg via ORAL
  Filled 2023-12-29: qty 2

## 2023-12-29 NOTE — Interdisciplinary (Signed)
 Non-Chemotherapy Infusion Nursing Note - [facility]    Tiffany Chen is a 78 year old female who presents for infusion of IVIG  and labs     Medications   dextrose  5% infusion (0 mL IntraVENOUS Stopped 12/29/23 1100)   acetaminophen  (TYLENOL ) tablet 650 mg (650 mg Oral Given 12/29/23 0817)   cetirizine  (ZyrTEC ) tablet 10 mg (10 mg Oral Given 12/29/23 0817)   immune globulin  (human) (IgG) 30 g in 300 mL (GAMUNEX-C ) infusion (0 g IntraVENOUS Stopped 12/29/23 1054)        Problem List[1]    Vitals:    12/29/23 0756 12/29/23 0758 12/29/23 1053   BP:  133/64 138/71   BP Location:  Left arm    BP Patient Position:  Sitting    Pulse:  74 70   Resp:  16 16   Temp:  96.7 F (35.9 C)    TempSrc:  Temporal    SpO2:  98%    Weight: 67.9 kg (149 lb 11.1 oz)     Height: 5' 2 (1.575 m)       Pain Score: 0  Body surface area is 1.72 meters squared.  Body mass index is 27.38 kg/m.    Pre-treatment nursing assessment:  No problems identified upon assessment.    Tiffany Chen tolerated treatment well.  Observation: Not ordered    Post blood return: Brisk, labs drawn from IV start and sent   Post-Flush: NS    Patient Education  Learner: Patient  Barriers to learning: No Barriers  Readiness to learn: Acceptance  Method: Explanation    Treatment Education: Information/teaching given to patient including: signs and symptoms of infection, bleeding, adverse reaction(s), symptom control, and when to notify MD.    [place] Prevention Education: Instructed patient to call for assistance.    Pain Education: Patient instructed to contact nurse if pain should develop or if their current pain therapy becomes ineffective.    Response: Verbalizes understanding    Discharge Plan  Discharge instructions given to patient.  Future appointments given and reviewed with treatment plan.  Discharge Mode: Ambulatory  Discharge Time: 1100  Accompanied by: Self  Discharged To: Home          [1]   Patient Active Problem List  Diagnosis     Acquired tracheobronchomegaly with bronchiectasis (CMS-HCC)    MAI (mycobacterium avium-intracellulare) (CMS-HCC)    Cavitary lesion of lung    Gastroesophageal reflux disease, unspecified whether esophagitis present    Hoarse voice quality    Immunodeficiency with predominantly antibody defects    Mycobacterium avium complex    s/p Robotic right upper lobectomy with en bloc lower and middle lobe wedge resection, middle lobe wedge resection    Status post Hartmann procedure (CMS-HCC)    Ventral hernia without obstruction or gangrene    Colostomy status (CMS-HCC)    Status post cataract extraction and insertion of intraocular lens of right eye    Status post cataract extraction and insertion of intraocular lens of left eye    Type 2 diabetes mellitus without complication, without long-term current use of insulin     Pseudophakia of both eyes    Posterior vitreous detachment    S/P repair of ventral hernia    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

## 2023-12-30 ENCOUNTER — Encounter (HOSPITAL_BASED_OUTPATIENT_CLINIC_OR_DEPARTMENT_OTHER): Payer: Self-pay | Admitting: Allergy

## 2023-12-30 NOTE — Addendum Note (Signed)
 Encounter addended by: Tressa Patty on: 12/30/2023 4:03 PM   Actions taken: Flowsheet accepted, Charge Capture section accepted

## 2024-01-06 ENCOUNTER — Encounter (INDEPENDENT_AMBULATORY_CARE_PROVIDER_SITE_OTHER): Admitting: Nurse Practitioner

## 2024-01-20 ENCOUNTER — Ambulatory Visit (INDEPENDENT_AMBULATORY_CARE_PROVIDER_SITE_OTHER)

## 2024-01-20 DIAGNOSIS — F32A Depression, unspecified: Secondary | ICD-10-CM

## 2024-01-20 MED ORDER — BUPROPION XL (DAILY) 300 MG OR TB24: 300.0000 mg | ORAL_TABLET | Freq: Every morning | ORAL | 2 refills | Status: AC

## 2024-01-20 NOTE — Progress Notes (Unsigned)
 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     Patient last seen on 12/16/2023. At that time fluoxetine  was continued at 40mg  daily and bupropion  XL was increased to 300mg  daily.     Patient states that the medication has really helped. She states that she was better able to handle the busy holiday season and did not isolate. She reports getting up around 0700 with more motivation. She is completing tasks and not procrastinating. She reports improved sleep at night and is no longer napping during the day. She has been less irritable, feels calmer, and has been going out with friends and family. She recently went to church as well which was something she had been wanting to do for a while.     Her bird Rosaland) is doing well and she continues to live with her sister and help take care of her niece. She denies any SI. She denies any negative side effects of medication. She reports she still occasionally has thoughts about the losses she has had and would like to discuss this in therapy.     Compliant with medications: Yes    Current psychotropic medications:   -- Bupropion  XR 300mg  daily (started on 12/09/2023; increased on 12/16/2023)   -- Fluoxetine  40mg  daily  (started around 1997)     Current psychotherapy: None, has intake appointment scheduled for 02/12/2024       ADLS  Activity    Bathing Independent   Dressing Independent   Feeding Independent   Grooming Independent   Maintaining continence Independent   Toileting Independent   Transferring Independent       Has there been any changes since last visit?  No       IADLS  Activity    Shopping Independent   Using telephone Independent   Performing housework Independent   Doing laundry Independent   Taking medications Independent   Handling finances Independent   Meal preparation Independent   Doing home repair Independent   Driving Independent       Has there been any changes since last visit?  No      Psychiatric ROS:  Symptoms of depression, mania/hypomania, psychosis, and anxiety were reviewed and are negative/noncontributory except mentioned in HPI     Physical ROS:  All 14 systems reviewed and are negative/noncontributory except mentioned in HPI    Psychiatric History:  Reviewed and unchanged from previous visit besides what is mentioned in HPI    Medical History :  Reviewed and unchanged from previous visit besides what is mentioned in HPI  Past Medical History[1]    Allergies:  Allergies[2]       Medications:   albuterol   3 mL (2.5 mg) by Nebulization route every 4 hours as needed for Wheezing. 150 mL 11    ammonium lactate  Apply 1 Application. topically 2 times daily. Rub in to affected area well 1 each 11    atorvastatin  Take 1 tablet (20 mg) by mouth daily. 90 tablet 3    biotin  Take 1 tablet (1,000 mcg) by mouth daily.      blood glucose PHARMACY: Please dispense insurance approved bluetooth enable meter. 1 each 0    buPROPion  Take 1 tablet (300 mg) by mouth every morning. 30 tablet 3    Cyanocobalamin (VITAMIN B 12 PO)       ferrous sulfate  Take 1 tablet (325 mg) by mouth daily.      FLUoxetine  Take 1 capsule (40 mg) by mouth daily. 90 capsule 3    gabapentin  Take 6 mL (300 mg) by mouth every 8 hours. 300 mL 0     glucose blood 1 strip by Other route every morning (before breakfast). 100 strip 2    glucose blood 1 strip by Other route 2 times daily (before meals). 100 strip 11    lancets 1 each by Other route every morning (before breakfast). 100 each 2    lancets 1 each by Other route 2 times daily (before meals). 100 Lancet. 5    metFORMIN  Take 1 tablet (500 mg) by mouth every morning AND 2 tablets (1,000 mg) every evening. 270 tablet 0    multivitamin Take 1 tablet by mouth daily.      ramipril  Take 1 capsule (5 mg) by mouth daily. 90 capsule 1    sodium chloride  4 mL by Nebulization route every 12 hours. 240 mL 11    sodium chloride  USE 1 VIAL VIA NEBULIZER  EVERY 12 HOURS 720 mL 3    umeclidinium-vilanterol Inhale 1 puff by mouth daily. 60 each 11    Ventolin  HFA USE 2 INHALATIONS BY MOUTH  EVERY 6 HOURS AS NEEDED FOR WHEEZING 72 g 3    vitamin D3 Take 1 capsule (5,000 Units) by mouth every 24 hours.      vitamin E  Take 1 capsule (400 Units) by mouth daily.           Social History:  Reviewed and unchanged from previous visit besides what is mentioned in HPI  Social Drivers of Health     Tobacco Use: Low Risk  (07/30/2023)    Patient History     Smoking Tobacco Use: Never     Smokeless Tobacco Use: Never     Passive Exposure: Not on file   Alcohol Use: Not on file   Financial Resource Strain: Not on file   Food Insecurity: No Food Insecurity (12/09/2023)    Hunger Vital Sign     Worried About Running Out of Food in the Last Year: Never true     Ran Out of Food in the Last Year: Never true   Transportation Needs: No Transportation Needs (02/12/2023)    PRAPARE - Therapist, Art (Medical): No     Lack of Transportation (Non-Medical): No   Physical Activity: Not on file (12/09/2023)   Stress: Not on file   Social Connections: Not on file   Intimate Partner Violence: Low Risk  (02/10/2023)    [facility] IPV     Have you ever been emotionally or physically abused by your partner or someone important to you?: No      Within the last year have  you been hit, slapped, kicked or otherwise physically hurt by someone?: No     Since you've been pregnant, have you been slapped, kicked or otherwise physically hurt by someone?: Not on file     Within the last year has anyone forced you to have sexual activities?: No     Are you afraid of your spouse or partner you listed above?: No   Depression: At risk (12/16/2023)    Depression     PHQ 2/9 Score: 24   Housing Stability: Low Risk  (02/12/2023)    Housing Stability Vital Sign     Unable to Pay for Housing in the Last Year: No     Number of Times Moved in the Last Year: 0     Homeless in the Last Year: No   Utilities: Not At Risk (02/12/2023)    [employer] Utilities     Threatened with loss of utilities: No       Family History:  Reviewed and unchanged from previous visit besides what is mentioned in HPI  Family History[3]     O:  Vital Signs:  Blood pressure (BP): (129)/(71) 129/71 (01/13 1133)  Heart Rate:  [84] 84 (01/13 1133)  SpO2:  [97 %] 97 % (01/13 1133)       Mental Status Exam:  Physical appearance: appropriate appearance   , adequate grooming   , well-nourished   , and wearing a matching outfit with platform/wedge sandals.   Relatedness: engaged  Eye contact: good   Attitude: cooperative and friendly   Speech quality: clear with normal rate and volume     Motor behavior: normal  Mood: euthymic  Affect: congruent and full-range  Thought process: linear, organized  Thought content: no evidence of psychotic symptoms  Suicidal ideation: none  Homicidal ideation: none  Orientated to: Person:  yes  Place:  yes   Time:  yes   Sensorium: intact  Attention/Concentration: grossly intact    Memory: grossly intact   Insight: intact  Judgment: intact    Labs:  CBC  Lab Results   Component Value Date    WBC 5.8 09/10/2023    RBC 4.10 09/10/2023    HGB 12.6 09/10/2023    HCT 37.9 09/10/2023    MCV 92.4 09/10/2023    MCH 30.7 09/10/2023    MCHC 33.2 09/10/2023    RDW 12.8 09/10/2023    MPV 10.9  09/10/2023    PLT 235 09/10/2023    IANC 3.2 04/26/2022    SEG 68.0 09/10/2023    IMMGRANPC 0.3 09/10/2023    LYMPHS 17.7 09/10/2023    MONOS 10.0 09/10/2023    EOS 3.3 09/10/2023    BASOS 0.7 09/10/2023    ABSNEUTRO 4.0 09/10/2023    ABSLYMPHS 1.0 09/10/2023    ABSMONOS 0.6 09/10/2023    ABSEOS 0.2 09/10/2023    DIFFTYPE Automated 09/10/2023       CMP  Lab Results   Component Value Date    GLU 127 (H) 09/10/2023    BUN 17 09/10/2023    CREAT 0.75 09/10/2023    EGFRCKDEPI >60 09/10/2023    NA 137 09/10/2023    K 4.2 09/10/2023    CL 101 09/10/2023    BICARB 25 09/10/2023    ANION 11 09/10/2023    CA 9.6 09/10/2023    TP 7.1 06/10/2023    ALB 4.2 06/10/2023    TBILI 0.44 06/10/2023    AST 30 06/10/2023    ALT 29 06/10/2023  ALK 71 06/10/2023       TSH  Lab Results   Component Value Date    TSH 3.09 06/10/2023       TP 7.1 (06/03) ALT 29 (06/03) TBILI 0.44 (06/03) ALK PHOS  71 (06/03)   ALB 4.2 (06/03) AST 30 (06/03) DBILI        A1c 6.7* (09/03)        Pertinent Imaging:  MRA BRAIN WO/W CONTRAST 07/19/2022 11:32 am     CLINICAL HISTORY:  Follow-up of aneursym coiling; (Z86.79) History of aneurysm     TECHNIQUE:  Intracranial MR angiogram was performed on a [employer] system without and with intravenous contrast.  COMPARISON:  MRA of the brain dated 05/27/2021     IMPRESSION:  No radiographically significant change from 05/30/2021.  No evidence of an acute aneurysm.  No evidence of arterial stenosis.  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  Comparison with previous catheter angiography is recommended.    Neuropsych Testing:      02/24/2023     2:11 PM 05/27/2023    10:44 AM 06/23/2023     8:05 AM 06/23/2023    10:38 AM 12/09/2023     9:01 AM 12/09/2023     9:11 AM 12/16/2023     9:22 AM   [facility] PHQ9 DEPRESSION QUESTIONNAIRE   Interest 0 0 0 0 3 3 3    Depressed 0 0 0 0 2 3 3    Sleep -- 0 -- -- 3  3 3    Energy -- 1 -- -- 3 3 3    Appetite -- 0 -- -- 1 1 3    Failure -- 0 -- -- 2 1 3    Concentration -- 1 -- -- 2 1 3    Movement -- 0 -- -- 1 1 3    Suicide -- 0 -- -- 0 0 0   Summary(Manual) -- -- -- -- -- -- --   Summary(Calculated) -- 2  -- -- 17  16  24     Functional -- Not difficult at all -- -- Extremely difficult Very difficult Extremely difficult       Patient-reported          07/25/2021     9:24 AM 05/03/2022    11:06 AM 12/26/2022    10:20 AM 05/27/2023    10:48 AM 12/16/2023     9:21 AM   GAD 7   1. Feeling nervous, anxious or on edge 3 1 2 1 3    2. Not being able to stop or control worrying 2 1 2  0 2   3. Worrying too much about different things 2 1 2 1 2    4. Trouble relaxing 2 0 2 0 3   5. Being so restless that it is hard to sit still 1 0 0 0 2   6. Being easily annoyed or irritable 2 1 3  0 1   7. Feeling afraid as if something awful might happen 2 0 2 0 1   GAD7 Patient Total 14 4 13 2 14     If you checked off any problems, how difficult have these problems made it for you to do your job along with other people? Very difficult Very difficult Not difficult at all Not difficult at all Extremely difficult       Patient-reported  A/P    Assessment:  Patient's presentation is consistent with Major Depressive Disorder. There is also a significant component of anxiety. Will continue to evaluate to determine if this is more of an MDD with anxious distress vs GAD picture. Biologically the patient has a genetic vulnerability to mental health illness given family history. She has chronic medical comorbidities that are likely contributing to her presentation. No concerning substance use at this time. She has been on fluoxetine  for about 30 years now and bupropion  was recently added. Psychologically, she does have a history of trauma and has had multiple losses in her life. She continues to struggle with the loss of her husband which was recently compounded by the loss of her pet bird (which was an  anniversary gift.) She is help seeking and demonstrates appropriate insight into her symptoms. Socially, she has good social support and helps take care of her niece. She has multiple hobbies, however she is currently not engaging in any of them and is isolating more. She does not have access to weapons and is motivated for treatment.      Today patient's mental status exam was reassuring and she appeared more euthymic and calm. She has tolerated and felt significant benefit from increasing bupropion . Specifically noting improvement in lassitude, anhedonia, anxiety and hypersomnia. She is engaged in treatment and is motivated to start therapy. Will plan to keep medication at current dose and follow up in about 2 months. Patient understood and was amenable with this plan.      # Major Depressive Disorder    Plan:  - Medications:  ---Continue bupropion  XL 300mg  daily   ---Continue fluoxetine  40mg  daily      - Therapy   ---Referral for group and individual therapy placed - Has intake appointment in early February 2026      - The patient has adequate information on which to make informed treatment decisions, is capable of understanding the information presented and voluntarily consenting to the treatment plan.   - The patients learning needs were assessed which included evaluation of patients cultural and religious beliefs, emotional barriers, desire and motivation to learn, physical or cognitive limitations and barriers to communication.   - The patient understands the risks and benefits of giving and withholding information about their psychiatric status including suicidal thoughts, plans, and intentions   - Treatment goals: mood stabilization, control of anxiety, and maintenance of function  - Discussed rationale for current treatment plan and alternative treatment options available  - Release of information not needed at this time  - Patient provided with telephone number to call in case of emergency or was  instructed to seek care at the ED if at increased risk for danger to self or danger to others    Follow-Up: 2 months       Darice Bunker, MD  Psychiatry PGY-4     Attending Attestation:    I personally interviewed and examined the patient on 01/20/2024, and I have reviewed the note by Dr. Bunker from 01/20/2024.    I agree w/ the resident history, exam, assessment, and plan.       Mancel Rains, MD  Geriatric psychiatry attending            [1]   Past Medical History:  Diagnosis Date    Acquired tracheobronchomegaly with bronchiectasis (CMS-HCC) 05/22/2020    Age-related nuclear cataract of left eye 10/15/2021    Cavitary lesion of lung 07/24/2020    Chronic obstructive airway disease (CMS-HCC)  2018    MAC    Combined forms of age-related cataract of both eyes 10/15/2021    Depression     DM (diabetes mellitus) (CMS-HCC)     Gastroesophageal reflux disease, unspecified whether esophagitis present 10/12/2020    Added automatically from request for surgery [id number]    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   [2]   Allergies  Allergen Reactions    Walnuts Swelling     Tongue swollen and burn   [3]   Family History  Problem Relation Name Age of Onset    Cancer Mother Jenkins Macario Dutton ngs and throat    Heart Disease Mother Jenkins Macario         Congrestive heart failure due to cancer    Hypertension Mother Jenkins Macario     Psychiatry Mother Jenkins Macario         Bi polar, manic depressive    Cancer Father Elsie Macario         Prostate    Diabetes Father Elsie Macario      Stroke Father Elsie Macario     Stroke M Grandmother Winifred Macario     Cancer Brother Lynwood Macario         Skin (many locations)    Diabetes Brother Lynwood Macario

## 2024-01-21 NOTE — Addendum Note (Signed)
 Addended by: Rylah Fukuda on: 01/21/2024 01:42 PM     Modules accepted: Orders, Level of Service

## 2024-01-26 ENCOUNTER — Ambulatory Visit

## 2024-01-27 ENCOUNTER — Ambulatory Visit: Admission: RE | Admit: 2024-01-27 | Discharge: 2024-01-27 | Disposition: A | Attending: Allergy

## 2024-01-27 ENCOUNTER — Encounter (HOSPITAL_BASED_OUTPATIENT_CLINIC_OR_DEPARTMENT_OTHER): Payer: Self-pay | Admitting: Allergy

## 2024-01-27 VITALS — BP 145/77 | HR 72 | Temp 96.3°F | Resp 16 | Ht 62.0 in | Wt 150.0 lb

## 2024-01-27 DIAGNOSIS — D809 Immunodeficiency with predominantly antibody defects, unspecified: Secondary | ICD-10-CM | POA: Insufficient documentation

## 2024-01-27 MED ORDER — IMMUNE GLOBULIN (HUMAN) 20 GM/200ML IJ SOLN
0.5500 g/kg | Freq: Once | INTRAMUSCULAR | Status: AC
  Administered 2024-01-27: 30 g via INTRAVENOUS
  Filled 2024-01-27: qty 200

## 2024-01-27 MED ORDER — DEXTROSE 5 % IV SOLN: INTRAVENOUS | Status: DC

## 2024-01-27 MED ORDER — DIPHENHYDRAMINE HCL 25 MG OR TABS OR CAPS CUSTOM: 25.0000 mg | ORAL_CAPSULE | Freq: Once | ORAL | Status: DC | PRN

## 2024-01-27 MED ORDER — CETIRIZINE HCL 10 MG OR TABS
10.0000 mg | ORAL_TABLET | Freq: Once | ORAL | Status: AC
  Administered 2024-01-27: 10 mg via ORAL
  Filled 2024-01-27: qty 1

## 2024-01-27 MED ORDER — ACETAMINOPHEN 325 MG PO TABS
650.0000 mg | ORAL_TABLET | Freq: Once | ORAL | Status: AC
  Administered 2024-01-27: 650 mg via ORAL
  Filled 2024-01-27: qty 2

## 2024-01-27 NOTE — Interdisciplinary (Signed)
 Non-Chemotherapy Infusion Nursing Note - [facility]    Tiffany Chen is a pleasant  79 year old female who presents for infusion of IVIG Gamunex 35 gm    Problem List[1]    Vitals:    01/27/24 0808 01/27/24 1053   BP: 147/86 145/77   BP Location: Right arm Right arm   BP Patient Position: Sitting Sitting   Pulse: 79 72   Resp: 16 16   Temp: 96.5 F (35.8 C) 96.3 F (35.7 C)   TempSrc: Temporal Temporal   SpO2: 97%    Weight: 68.1 kg (150 lb 0.4 oz)    Height: 5' 2 (1.575 m)      Pain Score: 0  Body surface area is 1.73 meters squared.  Body mass index is 27.44 kg/m.    Pre-treatment nursing assessment:  Patient arrived ambulatory in stable condition. Unaccompanied, AO x 4  Denies pain.   Denies any recent infections, states the IVIG is helping  Denies nausea or vomiting.   Denies diarrhea or constipation.   Denies fevers or recent illness.   Pt states she  is hydrating and eating well.     Peripheral IV inserted to left hand with +blood return.    Tiffany Chen tolerated treatment well. Titrated as per orders, pls see MAR  Observation: Not ordered    Post blood return: Brisk  Post-Flush: Discontinued IV  Medications   dextrose  5% infusion (has no administration in time range)   diphenhydrAMINE  (BENADRYL ) tablet or capsule 25 mg (has no administration in time range)   acetaminophen  (TYLENOL ) tablet 650 mg (650 mg Oral Given 01/27/24 0810)   cetirizine  (ZyrTEC ) tablet 10 mg (10 mg Oral Given 01/27/24 0810)   immune globulin  (human) (IgG) 30 g in 300 mL (GAMUNEX-C ) infusion (0 g IntraVENOUS Completed 01/27/24 1058)        Patient Education  Learner: Patient  Barriers to learning: No Barriers  Readiness to learn: Acceptance  Method: Explanation    Treatment Education: Information/teaching given to patient including: signs and symptoms of infection, bleeding, adverse reaction(s), symptom control, and when to notify MD.    [place] Prevention Education: Instructed patient to call for assistance.    Pain  Education: Patient instructed to contact nurse if pain should develop or if their current pain therapy becomes ineffective.    Response: Verbalizes understanding    Discharge Plan  Discharge instructions given to patient.  Future appointments given and reviewed with treatment plan.  Discharge Mode: Ambulatory  Discharge Time: 1115  Accompanied by: Self  Discharged To: Home          [1]   Patient Active Problem List  Diagnosis    Acquired tracheobronchomegaly with bronchiectasis (CMS-HCC)    MAI (mycobacterium avium-intracellulare) (CMS-HCC)    Cavitary lesion of lung    Gastroesophageal reflux disease, unspecified whether esophagitis present    Hoarse voice quality    Immunodeficiency with predominantly antibody defects    Mycobacterium avium complex    s/p Robotic right upper lobectomy with en bloc lower and middle lobe wedge resection, middle lobe wedge resection    Status post Hartmann procedure (CMS-HCC)    Ventral hernia without obstruction or gangrene    Colostomy status (CMS-HCC)    Status post cataract extraction and insertion of intraocular lens of right eye    Status post cataract extraction and insertion of intraocular lens of left eye    Type 2 diabetes mellitus without complication, without long-term current use  of insulin     Pseudophakia of both eyes    Posterior vitreous detachment    S/P repair of ventral hernia    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

## 2024-01-28 ENCOUNTER — Encounter (INDEPENDENT_AMBULATORY_CARE_PROVIDER_SITE_OTHER): Payer: Self-pay | Admitting: Allergy

## 2024-01-28 ENCOUNTER — Ambulatory Visit (INDEPENDENT_AMBULATORY_CARE_PROVIDER_SITE_OTHER): Admitting: Allergy

## 2024-01-28 VITALS — BP 114/73 | HR 85 | Temp 96.8°F | Ht 63.0 in | Wt 145.0 lb

## 2024-01-28 DIAGNOSIS — D809 Immunodeficiency with predominantly antibody defects, unspecified: Secondary | ICD-10-CM

## 2024-01-28 DIAGNOSIS — J479 Bronchiectasis, uncomplicated: Secondary | ICD-10-CM

## 2024-01-28 NOTE — Progress Notes (Signed)
 Allergy/Immunology Follow-up    Date: 01/28/2024  ______________________________________________________________________    HPI: Tiffany Chen is a 79 year old female here for follow-up for immunodeficiency. Patient was last seen on 07/30/2023 at which time recommendations were to continue Gamunex-C  at 30 g every 4 weeks. Last IgG stable 940 in December 2025. Normal BMP and CBC. Patient is doing well without any significant interim sinopulmonary infections.     Follow-up 07/30/2023: Patient was last seen on 01/29/2023 at which time recommendations were to continue at dosing Gamunex-C  at 30 g every 4 weeks. Last IgG 940. No significant infections since the last visit and tolerating infusions well.     Follow-up 01/29/2023: Patient was last seen on 07/31/2022 at which time recommendations were for continuation of IgG replacement therapy Gamunex-C  at 25g every 4 weeks. IgG 888 10/2022. Did have a cold for 3 weeks requiring ED visit (coughing, vomiting) in November. Discussed given weight changes, and drop in IgG to increase dosing to 30g every 4 weeks.     Follow-up 07/31/2022: Patient was last seen on 01/30/2022 at which time recommendations were continuation of IgG replacement therapy with Gamunex-C  at 25g every 4 weeks, which is tolerated.     Follow-up 01/30/22: Patient was last seen on 08/01/2021 at which time recommendations were for continuation of IgG replacement therapy ( Gamunex-C  at 25g every 4 weeks ) with IgG check. Last IgG 936 in November 2023. AST slightly elevated. Pending hernia repair/GI surgery. She has noted a significant positive impact on infusions. Tolerating infusions well.     Follow-up 08/01/21: Patient was last seen on 03/19/2021 at which time recommendations were for continuation of IgG replacement therapy Gamunex-C  at 25g every 4 weeks. IgG 1030 1 month ago. CBC without cytopenia.    Follow-up 03/19/21: Patient was last seen on 12/18/2020 at which time recommendations were to proceed with IVIG at  [facility] starting at 500mg /kg. Patient has tolerated IVIG well, and is now s/p 3 infusions. She will be undergoing lung lobectomy on 3/14.     Follow-up 12/18/20: Patient was last seen on 09/07/2020 at which time recommendations were for additional immune work-up including repeat quantitative immunoglobulins as well as vaccine titers (tetanus and pneumococcal). Pneumovax-23 administered 09/22/20. Only 14/23 protective at recheck. Discussion on IVIG replacement therapy today, and patient has informed Tuesday or Wed in 2 weeks at [facility] would be ideal.     Initial consultation: Prior medical records have been reviewed carefully prior to this consultation and salient details have been summarized below. Patient has a history of bronchiectasis, cavitary lung infection (MAC). There is no history of chronic sinusitis. She has had multiple rounds of treatment for MAC, with most recent antibiotic regimen including amikacin , azithromycin , ethambutol  and rifampin .     Environmental and social history:  Tobacco smoke exposure:   Social History     Tobacco Use   Smoking Status Never   Smokeless Tobacco Never         Past Medical and Surgical History:  Past Medical History:   Diagnosis Date    Acquired tracheobronchomegaly with bronchiectasis (CMS-HCC) 05/22/2020    Age-related nuclear cataract of left eye 10/15/2021    Cavitary lesion of lung 07/24/2020    Chronic obstructive airway disease (CMS-HCC) 2018    MAC    Combined forms of age-related cataract of both eyes 10/15/2021    Depression     DM (diabetes mellitus) (CMS-HCC)     Gastroesophageal reflux disease, unspecified whether esophagitis present 10/12/2020  Added automatically from request for surgery [id number]    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)      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     Past Surgical History:   Procedure Laterality Date    BRONCHOSCOPY  12/2020    BRAIN SURGERY  12, 2021    CESAREAN SECTION, LOW TRANSVERSE      COLON SURGERY  2009    COLOSTOMY      CRANIOTOMY      LUNG LOBECTOMY         Allergies:  Allergies   Allergen Reactions    Walnuts Swelling     Tongue swollen and burn       Medications:    Current Outpatient Medications:     albuterol  (PROVENTIL ) (2.5 MG/3ML) 0.083% nebulization, 3 mL (2.5 mg) by Nebulization route every 4 hours as needed for Wheezing., Disp: 150 mL, Rfl: 11    ammonium lactate  (AMLACTIN) 12 % cream, Apply 1 Application. topically 2 times daily. Rub in to affected area well, Disp: 1 each, Rfl: 11    atorvastatin  (LIPITOR) 20 MG tablet, Take 1 tablet (20 mg) by mouth daily., Disp: 90 tablet, Rfl: 3    blood glucose meter, PHARMACY: Please dispense insurance approved bluetooth enable meter., Disp: 1 each, Rfl: 0    buPROPion  (WELLBUTRIN  XL) 300 MG XL tablet, Take 1 tablet (300 mg) by mouth every morning., Disp: 90 tablet, Rfl: 2    Cyanocobalamin (VITAMIN B 12 PO), , Disp: , Rfl:     ferrous sulfate  325 (65 Fe) MG tablet, Take 1 tablet (325 mg) by mouth daily., Disp: , Rfl:     FLUoxetine  (PROZAC ) 40 MG capsule, Take 1 capsule (40 mg) by mouth daily., Disp: 90 capsule, Rfl: 3    gabapentin  (NEURONTIN ) 250 MG/5ML solution, Take 6 mL (300 mg) by mouth every 8 hours., Disp: 300 mL, Rfl: 0    glucose blood test strip, 1 strip by Other route every morning (before breakfast)., Disp: 100 strip, Rfl: 2    glucose blood test strip, 1 strip by Other route 2 times daily (before meals)., Disp: 100 strip, Rfl: 11    lancets, 1 each by Other route every morning (before breakfast)., Disp: 100 each, Rfl: 2     lancets, 1 each by Other route 2 times daily (before meals)., Disp: 100 Lancet., Rfl: 5    metFORMIN  (GLUCOPHAGE ) 500 mg tablet, Take 1 tablet (500 mg) by mouth every morning AND 2 tablets (1,000 mg) every evening., Disp: 270 tablet, Rfl: 0    Multiple Vitamin (MULTIVITAMIN) TABS tablet, Take 1 tablet by mouth daily., Disp: , Rfl:     ramipril  (ALTACE ) 5 MG capsule, Take 1 capsule (5 mg) by mouth daily., Disp: 90 capsule, Rfl: 1    sodium chloride  7 % NEBU, 4 mL by Nebulization route every 12 hours., Disp: 240 mL, Rfl: 11    sodium chloride  7 % NEBU, USE 1 VIAL VIA NEBULIZER  EVERY 12 HOURS, Disp: 720 mL, Rfl: 3    umeclidinium-vilanterol (ANORO ELLIPTA ) 62.5-25 MCG/ACT inhaler, Inhale 1 puff by mouth daily., Disp: 60 each, Rfl: 11  VENTOLIN  HFA 108 (90 Base) MCG/ACT inhaler, USE 2 INHALATIONS BY MOUTH  EVERY 6 HOURS AS NEEDED FOR WHEEZING, Disp: 72 g, Rfl: 3    vitamin D3 125 MCG (5000 UT) capsule, Take 1 capsule (5,000 Units) by mouth every 24 hours., Disp: , Rfl:     vitamin E  400 UNIT capsule, Take 1 capsule (400 Units) by mouth daily., Disp: , Rfl:   No current facility-administered medications for this visit.    FH: No FH of atopic disease: asthma, allergic rhinitis, eczema, food allergies    Physical exam:  .askvitals    Resp: no accessory muscle usage, no respiratory distress, CTAB    Diagnostic studies:                  IMPRESSION:  1. Post interval right upper lobectomy with a small residual right hydropneumothorax.      2. Scattered tree-in-bud airspace opacities throughout the lungs, similar to slightly improved.  Previously seen patchy groundglass opacities within the lungs appear improved.    3. Mediastinal lymphadenopathy.  This appears slightly increased in comparison to the prior study.          Prior diagnostic studies:   CMP normal liver, total protein  CBC : no lymphopenia or thrombocytopenia                                    Impression:  79 year old here for evaluation of:    1.  Immunodeficiency with specific antibody deficiency/defects  2. Bronchiectasis   3. 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 Bronchiectasis   3. MAC infection  Patient presents for follow-up for immunodeficiency with antibody defects. Given her history of persistent MAC infection, bronchiectasis, low IgM, low IgG subclass, and poor vaccine titers, advise continuation of IgG replacement therapy. IgG replacement infusion therapy is well tolerated, is medically indicated given the above clinical profile and concerns for evolution to CVID given low IgM and low IgG subclass 2/3. IgG stable at 940 and tolerating 30g every 4 weeks. Continues bronchiectasis / MAC management with Dr. Bambi.      We have discussed both intravenous and subcutaneous immunoglobulin replacement therapy with the patient and she would like to continue with IVIG at [facility] - Tuesdays/Wednesdays best days. Risks of immunoglobulin replacement therapy were carefully reviewed with the patient as there are several black box warnings on the products. Although the risks general occur more in high dose immunoglobulin replacement regimens such as in neurological disorders, we have seen severe adverse effects at PID dosing and advise all patients that these risks are real, severe and capable of presenting at any dosing scheme. We have reviewed anaphylaxis, shock, death, pulmonary embolism, DVT, hemolytic disorders, acute renal failure (we have seen acute renal dysfunction but not failure in non-sucrose formulations), aseptic meningitis, TRALI/pulmonary edema, swelling, weight gain and local infusion site reactions/pain.   -Will continue at dosing Gamunex-C  at 30 g every 4 weeks  -Recommendations are for immunoglobulin replacement therapy  -Risks, alternatives, benefits discussed as above  -Risk of severe progression of lung disease and life-threatening infections if  immunoglobulin replacement is not approved and has been clearly documented above in terms of reasons to support prescription; failure to approve could lead to life-threatening pulmonary consequences   -IgG check prior to infusions    RTC 12 months  The plan of care was discussed with the patient who expressed agreement and understanding. Questions were answered prior to conclusion of the visit.     Marsa CANDIE Cramp, MD  Clinical Associate Professor, [facility]

## 2024-01-30 ENCOUNTER — Encounter (INDEPENDENT_AMBULATORY_CARE_PROVIDER_SITE_OTHER): Payer: Self-pay | Admitting: Allergy

## 2024-02-02 ENCOUNTER — Ambulatory Visit (INDEPENDENT_AMBULATORY_CARE_PROVIDER_SITE_OTHER): Admitting: Ophthalmology

## 2024-02-02 DIAGNOSIS — E119 Type 2 diabetes mellitus without complications: Secondary | ICD-10-CM

## 2024-02-02 DIAGNOSIS — Z961 Presence of intraocular lens: Secondary | ICD-10-CM

## 2024-02-02 NOTE — Progress Notes (Signed)
 Pseudophakia of both eyes  Assessment & Plan:  Stable.  Patient is comfortable using readers only.        Type 2 diabetes mellitus without complication, without long-term current use of insulin   Assessment & Plan:  HgbA1C 6.7 (09/10/23).  No diabetic retinopathy OU.          Return in about 1 year (around 02/01/2025).

## 2024-02-02 NOTE — Assessment & Plan Note (Signed)
 HgbA1C 6.7 (09/10/23).  No diabetic retinopathy OU.

## 2024-02-02 NOTE — Assessment & Plan Note (Signed)
 Stable.  Patient is comfortable using readers only.

## 2024-02-12 ENCOUNTER — Encounter (INDEPENDENT_AMBULATORY_CARE_PROVIDER_SITE_OTHER): Admitting: Clinical Child & Adolescent

## 2024-02-24 ENCOUNTER — Ambulatory Visit

## 2024-03-11 ENCOUNTER — Encounter (INDEPENDENT_AMBULATORY_CARE_PROVIDER_SITE_OTHER): Admitting: Internal Medicine

## 2024-03-16 ENCOUNTER — Encounter (INDEPENDENT_AMBULATORY_CARE_PROVIDER_SITE_OTHER)

## 2024-03-22 ENCOUNTER — Ambulatory Visit

## 2024-04-19 ENCOUNTER — Ambulatory Visit

## 2024-05-17 ENCOUNTER — Ambulatory Visit

## 2025-01-27 ENCOUNTER — Encounter (INDEPENDENT_AMBULATORY_CARE_PROVIDER_SITE_OTHER): Admitting: Allergy
